# Patient Record
Sex: Female | Born: 1965 | Race: White | Hispanic: No | Smoking: Former smoker
Health system: Southern US, Community
[De-identification: ages and names within clinical notes are randomized; demographics above are authoritative.]

## PROBLEM LIST (undated history)

## (undated) DIAGNOSIS — I739 Peripheral vascular disease, unspecified: Secondary | ICD-10-CM

## (undated) DIAGNOSIS — I639 Cerebral infarction, unspecified: Secondary | ICD-10-CM

## (undated) DIAGNOSIS — Z720 Tobacco use: Secondary | ICD-10-CM

## (undated) DIAGNOSIS — I2510 Atherosclerotic heart disease of native coronary artery without angina pectoris: Secondary | ICD-10-CM

## (undated) DIAGNOSIS — I10 Essential (primary) hypertension: Secondary | ICD-10-CM

## (undated) DIAGNOSIS — Z9289 Personal history of other medical treatment: Secondary | ICD-10-CM

## (undated) DIAGNOSIS — I4891 Unspecified atrial fibrillation: Secondary | ICD-10-CM

## (undated) DIAGNOSIS — I779 Disorder of arteries and arterioles, unspecified: Secondary | ICD-10-CM

## (undated) DIAGNOSIS — I340 Nonrheumatic mitral (valve) insufficiency: Secondary | ICD-10-CM

## (undated) DIAGNOSIS — G5139 Clonic hemifacial spasm, unspecified: Secondary | ICD-10-CM

## (undated) DIAGNOSIS — I509 Heart failure, unspecified: Secondary | ICD-10-CM

## (undated) DIAGNOSIS — Z9889 Other specified postprocedural states: Secondary | ICD-10-CM

## (undated) DIAGNOSIS — E785 Hyperlipidemia, unspecified: Secondary | ICD-10-CM

## (undated) DIAGNOSIS — Z951 Presence of aortocoronary bypass graft: Secondary | ICD-10-CM

## (undated) DIAGNOSIS — I429 Cardiomyopathy, unspecified: Secondary | ICD-10-CM

## (undated) DIAGNOSIS — D689 Coagulation defect, unspecified: Secondary | ICD-10-CM

## (undated) DIAGNOSIS — J069 Acute upper respiratory infection, unspecified: Secondary | ICD-10-CM

## (undated) DIAGNOSIS — R011 Cardiac murmur, unspecified: Secondary | ICD-10-CM

## (undated) HISTORY — DX: Nonrheumatic mitral (valve) insufficiency: I34.0

## (undated) HISTORY — PX: OTHER SURGICAL HISTORY: SHX169

## (undated) HISTORY — DX: Tobacco use: Z72.0

## (undated) HISTORY — DX: Hyperlipidemia, unspecified: E78.5

## (undated) HISTORY — DX: Cardiomyopathy, unspecified: I42.9

## (undated) HISTORY — PX: TUBAL LIGATION: SHX77

## (undated) HISTORY — DX: Coagulation defect, unspecified: D68.9

## (undated) HISTORY — PX: CARDIAC CATHETERIZATION: SHX172

## (undated) HISTORY — DX: Essential (primary) hypertension: I10

## (undated) HISTORY — DX: Heart failure, unspecified: I50.9

## (undated) HISTORY — DX: Cerebral infarction, unspecified: I63.9

## (undated) HISTORY — PX: DENTAL SURGERY: SHX609

## (undated) HISTORY — DX: Personal history of other medical treatment: Z92.89

## (undated) HISTORY — DX: Cardiac murmur, unspecified: R01.1

## (undated) HISTORY — DX: Atherosclerotic heart disease of native coronary artery without angina pectoris: I25.10

## (undated) HISTORY — DX: Clonic hemifacial spasm, unspecified: G51.39

## (undated) HISTORY — PX: TONSILLECTOMY: SUR1361

## (undated) SURGERY — ECHOCARDIOGRAM, TRANSESOPHAGEAL
Anesthesia: Moderate Sedation

---

## 1987-08-23 DIAGNOSIS — I639 Cerebral infarction, unspecified: Secondary | ICD-10-CM

## 1987-08-23 HISTORY — DX: Cerebral infarction, unspecified: I63.9

## 1997-12-19 ENCOUNTER — Emergency Department (HOSPITAL_COMMUNITY): Admission: EM | Admit: 1997-12-19 | Discharge: 1997-12-19 | Payer: Self-pay | Admitting: Emergency Medicine

## 1999-06-23 ENCOUNTER — Other Ambulatory Visit: Admission: RE | Admit: 1999-06-23 | Discharge: 1999-06-23 | Payer: Self-pay | Admitting: Obstetrics and Gynecology

## 2000-05-22 ENCOUNTER — Other Ambulatory Visit: Admission: RE | Admit: 2000-05-22 | Discharge: 2000-05-22 | Payer: Self-pay | Admitting: Obstetrics and Gynecology

## 2001-01-09 ENCOUNTER — Emergency Department (HOSPITAL_COMMUNITY): Admission: EM | Admit: 2001-01-09 | Discharge: 2001-01-09 | Payer: Self-pay | Admitting: Emergency Medicine

## 2001-01-09 ENCOUNTER — Encounter: Payer: Self-pay | Admitting: Emergency Medicine

## 2011-05-14 ENCOUNTER — Inpatient Hospital Stay (HOSPITAL_COMMUNITY)
Admission: EM | Admit: 2011-05-14 | Discharge: 2011-05-19 | DRG: 392 | Disposition: A | Discharge status: Home / Self Care | Payer: Medicaid Other | Source: Ambulatory Visit | Attending: Internal Medicine | Admitting: Internal Medicine

## 2011-05-14 ENCOUNTER — Emergency Department (HOSPITAL_COMMUNITY): Payer: Medicaid Other

## 2011-05-14 DIAGNOSIS — F17200 Nicotine dependence, unspecified, uncomplicated: Secondary | ICD-10-CM | POA: Diagnosis present

## 2011-05-14 DIAGNOSIS — Z79899 Other long term (current) drug therapy: Secondary | ICD-10-CM

## 2011-05-14 DIAGNOSIS — F1010 Alcohol abuse, uncomplicated: Secondary | ICD-10-CM | POA: Diagnosis present

## 2011-05-14 DIAGNOSIS — E876 Hypokalemia: Secondary | ICD-10-CM | POA: Diagnosis present

## 2011-05-14 DIAGNOSIS — K029 Dental caries, unspecified: Secondary | ICD-10-CM | POA: Diagnosis present

## 2011-05-14 DIAGNOSIS — I428 Other cardiomyopathies: Secondary | ICD-10-CM | POA: Diagnosis present

## 2011-05-14 DIAGNOSIS — D7589 Other specified diseases of blood and blood-forming organs: Secondary | ICD-10-CM | POA: Diagnosis present

## 2011-05-14 DIAGNOSIS — I10 Essential (primary) hypertension: Secondary | ICD-10-CM | POA: Diagnosis present

## 2011-05-14 DIAGNOSIS — E669 Obesity, unspecified: Secondary | ICD-10-CM | POA: Diagnosis present

## 2011-05-14 DIAGNOSIS — E871 Hypo-osmolality and hyponatremia: Secondary | ICD-10-CM | POA: Diagnosis not present

## 2011-05-14 DIAGNOSIS — Z8673 Personal history of transient ischemic attack (TIA), and cerebral infarction without residual deficits: Secondary | ICD-10-CM

## 2011-05-14 DIAGNOSIS — R1011 Right upper quadrant pain: Principal | ICD-10-CM | POA: Diagnosis present

## 2011-05-14 DIAGNOSIS — Z7982 Long term (current) use of aspirin: Secondary | ICD-10-CM

## 2011-05-14 DIAGNOSIS — Z8249 Family history of ischemic heart disease and other diseases of the circulatory system: Secondary | ICD-10-CM

## 2011-05-14 LAB — URINALYSIS, ROUTINE W REFLEX MICROSCOPIC
Glucose, UA: NEGATIVE mg/dL
Hgb urine dipstick: NEGATIVE
Ketones, ur: NEGATIVE mg/dL
Leukocytes, UA: NEGATIVE
Nitrite: NEGATIVE
Protein, ur: >300 mg/dL — AB
Specific Gravity, Urine: 1.019 (ref 1.005–1.030)
Urobilinogen, UA: 1 mg/dL (ref 0.0–1.0)
pH: 6 (ref 5.0–8.0)

## 2011-05-14 LAB — CBC
HCT: 43.1 % (ref 36.0–46.0)
Hemoglobin: 14.6 g/dL (ref 12.0–15.0)
MCH: 36 pg — ABNORMAL HIGH (ref 26.0–34.0)
MCHC: 33.9 g/dL (ref 30.0–36.0)
MCV: 106.4 fL — ABNORMAL HIGH (ref 78.0–100.0)
Platelets: 234 10*3/uL (ref 150–400)
RBC: 4.05 MIL/uL (ref 3.87–5.11)
RDW: 13.7 % (ref 11.5–15.5)
WBC: 9.8 10*3/uL (ref 4.0–10.5)

## 2011-05-14 LAB — COMPREHENSIVE METABOLIC PANEL
BUN: 10 mg/dL (ref 6–23)
Calcium: 9.5 mg/dL (ref 8.4–10.5)
GFR calc Af Amer: >60 mL/min (ref >60)
Glucose, Bld: 111 mg/dL — ABNORMAL HIGH (ref 70–99)
Total Protein: 7.3 g/dL (ref 6.0–8.3)

## 2011-05-14 LAB — DIFFERENTIAL
Basophils Absolute: 0.1 10*3/uL (ref 0.0–0.1)
Basophils Relative: 1 % (ref 0–1)
Eosinophils Absolute: 0.2 10*3/uL (ref 0.0–0.7)
Eosinophils Relative: 2 % (ref 0–5)
Lymphocytes Relative: 23 % (ref 12–46)
Lymphs Abs: 2.2 10*3/uL (ref 0.7–4.0)
Monocytes Absolute: 1.1 10*3/uL — ABNORMAL HIGH (ref 0.1–1.0)
Monocytes Relative: 11 % (ref 3–12)
Neutro Abs: 6.3 10*3/uL (ref 1.7–7.7)
Neutrophils Relative %: 65 % (ref 43–77)

## 2011-05-14 LAB — LIPASE, BLOOD: Lipase: 46 U/L (ref 11–59)

## 2011-05-15 ENCOUNTER — Inpatient Hospital Stay (HOSPITAL_COMMUNITY): Payer: Medicaid Other

## 2011-05-15 DIAGNOSIS — R1011 Right upper quadrant pain: Secondary | ICD-10-CM

## 2011-05-15 LAB — BASIC METABOLIC PANEL
BUN: 10 mg/dL (ref 6–23)
Calcium: 8.7 mg/dL (ref 8.4–10.5)
Creatinine, Ser: 0.74 mg/dL (ref 0.50–1.10)
GFR calc Af Amer: >60 mL/min (ref >60)
GFR calc non Af Amer: >60 mL/min (ref >60)
Potassium: 3.9 mEq/L (ref 3.5–5.1)

## 2011-05-15 LAB — DIFFERENTIAL
Eosinophils Relative: 0 % (ref 0–5)
Lymphocytes Relative: 11 % — ABNORMAL LOW (ref 12–46)
Lymphs Abs: 1.3 10*3/uL (ref 0.7–4.0)
Monocytes Absolute: 1.1 10*3/uL — ABNORMAL HIGH (ref 0.1–1.0)

## 2011-05-15 LAB — HEPATIC FUNCTION PANEL
AST: 16 U/L (ref 0–37)
Alkaline Phosphatase: 84 U/L (ref 39–117)
Bilirubin, Direct: 0.4 mg/dL — ABNORMAL HIGH (ref 0.0–0.3)
Total Bilirubin: 1.6 mg/dL — ABNORMAL HIGH (ref 0.3–1.2)

## 2011-05-15 LAB — CBC
HCT: 39.9 % (ref 36.0–46.0)
MCV: 107.5 fL — ABNORMAL HIGH (ref 78.0–100.0)
RDW: 13.7 % (ref 11.5–15.5)
WBC: 11.1 10*3/uL — ABNORMAL HIGH (ref 4.0–10.5)

## 2011-05-15 LAB — RAPID URINE DRUG SCREEN, HOSP PERFORMED
Benzodiazepines: NOT DETECTED
Cocaine: NOT DETECTED

## 2011-05-15 MED ORDER — TECHNETIUM TC 99M MEBROFENIN IV KIT
5.0000 | PACK | Freq: Once | INTRAVENOUS | Status: AC | PRN
  Administered 2011-05-15: 5 via INTRAVENOUS

## 2011-05-16 DIAGNOSIS — I10 Essential (primary) hypertension: Secondary | ICD-10-CM

## 2011-05-16 DIAGNOSIS — I509 Heart failure, unspecified: Secondary | ICD-10-CM

## 2011-05-16 LAB — FOLATE RBC: RBC Folate: 543 ng/mL (ref >=366)

## 2011-05-17 ENCOUNTER — Inpatient Hospital Stay (HOSPITAL_COMMUNITY): Payer: Medicaid Other

## 2011-05-17 LAB — BASIC METABOLIC PANEL
BUN: 11 mg/dL (ref 6–23)
CO2: 22 mEq/L (ref 19–32)
Chloride: 99 mEq/L (ref 96–112)
Glucose, Bld: 107 mg/dL — ABNORMAL HIGH (ref 70–99)
Potassium: 3.4 mEq/L — ABNORMAL LOW (ref 3.5–5.1)

## 2011-05-17 LAB — LIPID PANEL
HDL: 18 mg/dL — ABNORMAL LOW (ref >39)
LDL Cholesterol: 105 mg/dL — ABNORMAL HIGH (ref 0–99)
Total CHOL/HDL Ratio: 8 RATIO
VLDL: 21 mg/dL (ref 0–40)

## 2011-05-17 LAB — HEMOGLOBIN A1C: Hgb A1c MFr Bld: 5.8 % — ABNORMAL HIGH (ref <5.7)

## 2011-05-18 ENCOUNTER — Ambulatory Visit (HOSPITAL_COMMUNITY): Payer: 59 | Admitting: Dentistry

## 2011-05-18 LAB — BASIC METABOLIC PANEL
BUN: 13 mg/dL (ref 6–23)
CO2: 26 mEq/L (ref 19–32)
Calcium: 8.6 mg/dL (ref 8.4–10.5)
GFR calc non Af Amer: >60 mL/min (ref >60)
Glucose, Bld: 112 mg/dL — ABNORMAL HIGH (ref 70–99)

## 2011-05-19 LAB — BASIC METABOLIC PANEL
BUN: 13 mg/dL (ref 6–23)
CO2: 25 mEq/L (ref 19–32)
Calcium: 9.3 mg/dL (ref 8.4–10.5)
GFR calc non Af Amer: >60 mL/min (ref >60)
Glucose, Bld: 124 mg/dL — ABNORMAL HIGH (ref 70–99)

## 2011-05-20 ENCOUNTER — Encounter: Payer: Self-pay | Admitting: Physician Assistant

## 2011-05-22 NOTE — H&P (Signed)
Ashley [name], Ashley [name] NO.:  [id number]  MEDICAL RECORD NO.:  [id number]  LOCATION:  5019                         FACILITY:  [facility]  PHYSICIAN:  Della Goo, M.D. DATE OF BIRTH:  16-Jun-1966  DATE OF ADMISSION:  05/14/2011 DATE OF DISCHARGE:                             HISTORY & PHYSICAL   PRIMARY CARE PHYSICIAN:  None.  CHIEF COMPLAINT:  Right upper quadrant abdominal pain.  HISTORY OF PRESENT ILLNESS:  This is a 45 year old female who presents to the emergency department secondary to constant worsening right upper quadrant abdominal pain over the past 3 days.  She has had nausea. Denies having any vomiting and she also reports having subjective fevers and chills.  She rates her pain as being in 8/10 at the worse and states that the pain first began about 4 weeks ago and was occurring off and on at that time.  The patient was seen and evaluated in the emergency department, had an abdominal ultrasound study and a CT scan of the abdomen performed, results of which revealed an acalculous cholecystitis.  General Surgery was consulted to see this patient, Dr. Donell Beers, and arrangements have been made for the patient to be admitted to the hospitalist service and for General Surgery consultation for intervention.  PAST MEDICAL HISTORY:  Significant for previous cerebrovascular accident, mitral valve prolapse.  MEDICATIONS:  At this time include aspirin 81 mg 1 p.o. daily.  PAST SURGICAL HISTORY:  Tonsillectomy and C-section x1.  ALLERGIES:  No known drug allergies.  SOCIAL HISTORY:  The patient is a smoker.  She smokes one pack of cigarettes daily for 30 years.  She reports of alcohol usage 1 glass of wine every 3 days.  She denies any illicit drug usage.  The patient also has diffuse dental caries and periodontal disease.  FAMILY HISTORY:  Positive for coronary artery disease in her father, hypertension in her mother and her maternal grandmother had bone  cancer.  REVIEW OF SYSTEMS:  Pertinents mentioned above.  PHYSICAL EXAMINATION FINDINGS:  GENERAL:  This is a 45 year old obese, disheveled-appearing Caucasian female who is in discomfort but no acute distress. VITAL SIGNS:  Temperature 97.9, blood pressure initially 229/145 now 191/115.  Heart rate 98, respirations 19, O2 sats 97%.  HEENT: Normocephalic, atraumatic.  Pupils are equally round and reactive to light.  Extraocular movements are intact.  Funduscopic benign.  There is no scleral icterus.  Nares are patent bilaterally.  Oropharynx is clear except the patient has diffuse dental caries, broken sparse teeth.  Her gums are inflamed. NECK:  Supple.  Full range of motion.  No thyromegaly, adenopathy or jugular venous distention. CARDIOVASCULAR:  Regular rate and rhythm.  No murmurs, gallops or rubs appreciated. LUNGS:  Clear to auscultation bilaterally.  No rales, rhonchi or wheezes. ABDOMEN:  Positive bowel sounds, soft, tender in the right upper quadrant area.  She does have a positive Murphy sign.  Otherwise abdomen is soft, nondistended.  It is obese.  No hepatosplenomegaly. EXTREMITIES:  Without cyanosis, clubbing or edema. NEUROLOGIC:  Nonfocal.  LABORATORY STUDIES:  White blood cell count 9.9, hemoglobin 14.6, hematocrit 43.1, MCV 106.4, platelets 234, neutrophils 65%, lymphocytes 23%.  Sodium 135, potassium  4.1, chloride 99, carbon dioxide 25, BUN 10, creatinine 0.76 and glucose 111.  Urinalysis with protein greater than 300.  CT scan of the abdomen and pelvis reveals inflammation of fat surrounding gallbladder, possible mild gallbladder wall thickening. Abdominal ultrasound reveals a well-distended gallbladder, no gallstones, thickened gallbladder measuring up to 8 mm in thickness, small amount of pericholecystic fluid present and a positive Murphy sign.  The common bile duct is not dilated measuring 3.4 mm.  ASSESSMENT:  A 45 year old female being admitted with 1.  Acute acalculous cholecystitis. 2. Abdominal pain in the right upper quadrant secondary to #1. 3. Malignant hypertension. 4. Nausea. 5. History of mitral valve prolapse. 6. Diffuse dental caries.  PLAN:  The patient will be admitted to med surgery area.  She will be made n.p.o. and IV fluids have been ordered while she is n.p.o.  Pain control and antiemetic therapy have been ordered and p.r.n. IV hydralazine has been ordered for her elevated blood pressure.  Some of her elevation in blood pressure may also be secondary to pain. Premedication of this patient secondary to her mitral valve prolapse and her dental caries will be investigated.  The patient will be placed on DVT prophylaxis of SCDs at this time.     Della Goo, M.D.     HJ/MEDQ  D:  05/15/2011  T:  05/15/2011  Job:  [id number]  Electronically Signed by Della Goo M.D. on 05/22/2011 10:23:52 PM

## 2011-05-23 ENCOUNTER — Ambulatory Visit (INDEPENDENT_AMBULATORY_CARE_PROVIDER_SITE_OTHER): Payer: Self-pay | Admitting: Physician Assistant

## 2011-05-23 ENCOUNTER — Encounter: Payer: Self-pay | Admitting: Physician Assistant

## 2011-05-23 VITALS — BP 178/108 | HR 62 | Ht 68.0 in | Wt 206.8 lb

## 2011-05-23 DIAGNOSIS — F17200 Nicotine dependence, unspecified, uncomplicated: Secondary | ICD-10-CM

## 2011-05-23 DIAGNOSIS — I10 Essential (primary) hypertension: Secondary | ICD-10-CM | POA: Insufficient documentation

## 2011-05-23 DIAGNOSIS — I428 Other cardiomyopathies: Secondary | ICD-10-CM

## 2011-05-23 DIAGNOSIS — Z720 Tobacco use: Secondary | ICD-10-CM | POA: Insufficient documentation

## 2011-05-23 MED ORDER — CARVEDILOL 12.5 MG PO TABS: ORAL_TABLET | ORAL | Status: DC

## 2011-05-23 NOTE — Assessment & Plan Note (Signed)
Uncontrolled.  I will change her metoprolol to carvedilol 12.5 mg twice a day.  She will followup with me early next week.  If her blood pressure is better controlled, we will arrange cardiac catheterization as previously planned.

## 2011-05-23 NOTE — Assessment & Plan Note (Signed)
He is committed to quitting.

## 2011-05-23 NOTE — Patient Instructions (Signed)
Your physician recommends that you schedule a follow-up appointment in: 05/30/11 WITH SCOTT WEAVER, PA-C  Your physician has recommended you make the following change in your medication: STOP METOPROLOL; START COREG (CARVEDILOL) 12.5 MG, TONIGHT AND TOMORROW MORNING 05/24/11 TAKE ONLY 1/2 TABLET OF COREG; STARTING TOMORROW 05/24/11 AFTERNOON YOU WILL TAKE 1 WHOLE TABLET TWICE DAILY  Your physician recommends that you return for lab work in: TODAY BMET 401.1

## 2011-05-23 NOTE — Assessment & Plan Note (Signed)
No signs or symptoms of congestive heart failure.  She will continue ACE inhibitor and beta blocker.  As noted, change to Coreg.  Check a basic metabolic panel today.

## 2011-05-23 NOTE — Progress Notes (Signed)
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 female who presents for post hospital follow up.    She has a history of mitral valve prolapse, prior stroke in the setting of oral contraceptives and tobacco abuse and hypertension.    She was admitted 9/22-9/27.  She initially presented with abdominal pain.  Workup included an abdominal ultrasound, abdominal CT and HIDA scan.  There was a suspicion for chronic cholecystitis.  Her abdominal pain subsided.  She was noted to have cardiomegaly on CT.  This prompted an echocardiogram 9/24: Mild LVH, EF 35-40%, moderate MR which was difficult to judge with posterior leaflet restriction, moderate LAE, moderate RAE, moderate RVE, moderate TR.  She had significant hypertension with 229/145 upon presentation.  She had mild volume overload noted.  She was seen by Dr. Tenny Craw in consultation.  Right and left heart catheterization was recommended.  However, her blood pressure remained too high and it was felt that her blood pressure needs better control prior to proceeding with cardiac catheterization. She was also noted to have significant dental caries and has been referred to the dentist as an outpatient.    Pertinent labs:  Hemoglobin 13.6, MCV 107.5, potassium 3.6, creatinine 0.68, ALT 17, TC 144, TG 105, HDL 18, LDL 105, TSH 2.325.  Denies chest pain.  She has an occasional headache.  She denies significant shortness of breath.  She describes class II-IIb symptoms.  She denies orthopnea, PND or edema.  She denies syncope.  She feels somewhat weak since getting out of the hospital but is continuing to feel stronger.  She admits to compliance with medications.  She is now wearing a nitroglycerin patch and is trying to quit smoking.  She has been checking her BPs at home.  They are equal to what we obtained in the office today.   Past Medical History  Diagnosis Date  . Abdominal pain     ? chronic cholecystitis;  admxn to [facility] 9/12 (CT, USN, HIDA done)  . Stroke     in setting of cigs and OCPs  . MVP (mitral valve prolapse)   . Cardiomyopathy     echo 9/12: mild LVH, EF 35-40%, mod MR (difficult to judge /post leaflet restricted), mod BAE, mod RVE, mod TR  . HTN (hypertension)   . Tobacco abuse     Current Outpatient Prescriptions  Medication Sig Dispense Refill  . amLODipine (NORVASC) 5 MG tablet Take 5 mg by mouth daily.        . famotidine (PEPCID) 20 MG tablet Take 20 mg by mouth as needed.        [name] lisinopril (PRINIVIL,ZESTRIL) 10 MG tablet Take 10 mg by mouth 2 (two) times daily.        . nicotine (NICODERM CQ - DOSED IN MG/24 HOURS) 21 mg/24hr patch Place 1 patch onto the skin daily.        [name] aspirin EC 81 MG tablet Take 1 tablet (81 mg total) by mouth daily.      . carvedilol (COREG) 12.5 MG tablet TONIGHT TAKE 1/2 (HALF) OF TABLET AND TOMORROW AM 05/24/11 TAKE 1/2 (HALF) TABLET; STARTING TOMORROW 05/24/11 AFTERNOON YOU WILL TAKE 1 WHOLE TABLET TWICE DAILY  30 tablet  11  . hydrochlorothiazide (MICROZIDE) 12.5 MG capsule Take 1 capsule (12.5 mg total) by mouth daily.      [name] DISCONTD: carvedilol (COREG) 12.5 MG tablet TONIGHT TAKE 1/2 (HALF) OF TABLET AND TOMORROW AM  05/24/11 TAKE 1/2 (HALF) TABLET; STARTING TOMORROW 05/24/11 AFTERNOON YOU WILL TAKE 1 WHOLE TABLET TWICE DAILY  30 tablet  11  . DISCONTD: carvedilol (COREG) 12.5 MG tablet TONIGHT TAKE 1/2 (HALF) OF TABLET AND TOMORROW AM 05/24/11 TAKE 1/2 (HALF) TABLET; STARTING TOMORROW 05/24/11 AFTERNOON YOU WILL TAKE 1 WHOLE TABLET TWICE DAILY  30 tablet  11    Allergies: No Known Allergies  Vital Signs: BP 178/108  Pulse 62  Ht 5\' 8"  (1.727 m)  Wt 206 lb 12.8 oz (93.804 kg)  BMI 31.44 kg/m2 Repeat BP by me 180/110 bilaterally  PHYSICAL EXAM: Well nourished, well developed, in no acute distress HEENT: normal Neck: no JVD Cardiac:  normal S1, S2; RRR; 2/6 systolic murmur along LSB Lungs:  clear to auscultation bilaterally, no wheezing,  rhonchi or rales Abd: soft, nontender, no hepatomegaly Ext: no edema Skin: warm and dry Neuro:  CNs 2-12 intact, no focal abnormalities noted Psych: normal affect  EKG:  Sinus rhythm, heart rate 62, left axis deviation, nonspecific ST-T wave changes  ASSESSMENT AND PLAN:

## 2011-05-24 LAB — BASIC METABOLIC PANEL
CO2: 30 mEq/L (ref 19–32)
Calcium: 9 mg/dL (ref 8.4–10.5)
GFR: 73.28 mL/min (ref >60.00)
Sodium: 137 mEq/L (ref 135–145)

## 2011-05-25 NOTE — Consult Note (Addendum)
Ashley [name], Ashley [name] NO.:  [id number]  MEDICAL RECORD NO.:  [id number]  LOCATION:  5019                         FACILITY:  [facility]  PHYSICIAN:  Pricilla Riffle, MD, FACCDATE OF BIRTH:  1966/02/08  DATE OF CONSULTATION:  05/16/2011 DATE OF DISCHARGE:                                CONSULTATION   PRIMARY MEDICAL DOCTOR:  None.  PRIMARY CARDIOLOGIST:  New.  CHIEF COMPLAINT:  Right-sided abdominal pain.  REASON FOR CONSULTATION:  New LV dysfunction with EF of 35-40%.  HISTORY OF PRESENT ILLNESS:  Ashley [name] is a very pleasant 45 year old lady with a history of mitral valve prolapse evaluated approximately 16 years ago, hypertension, CVA, and tobacco abuse who was admitted with right upper quadrant pain.  Initial CT of the abdomen and abdominal ultrasound suggested acalculous cholecystitis.  However, HIDA demonstrated lack of dilatation of the cystic duct and common bile duct and therefore not felt to be consistent with acute cholecystitis.  No surgical interventions were felt necessary.  The HIDA scan suggests chronic cholecystitis versus intrinsic liver disease.  The patient was started on antibiotics with improvement in her symptoms.  She has some nausea without vomiting.  Also, on admission, she was noted to be markedly hypertensive with blood pressure of 229/145.  She denied any shortness of breath or chest pain, but has occasional dyspnea on insertion with stairs.  No orthopnea.  On her CT of the abdomen, it was then suddenly noted that she had marked cardiomegaly with LV predominant.  As part of her workup, 2-D echocardiogram was obtained showing mild concentric LVH with an EF of 35-40% with diffuse hypokinesis.  There was eccentric MR which was difficult to assess in severity with a restricted posterior leaflet.  She had moderate TR and PA pressure of 53 mmHg.  The patient denies any history of heart problems.  PAST MEDICAL HISTORY: 1. CVA at age. 45,  seen by Dr. Vassie [name] at that time.  This was felt     secondary to a combination of oral contraceptive pills and tobacco     use.  She developed dysarthria, left facial numbness, and left-     sided weakness with some residual facial asymmetry and left index     finger weakness. 2. Mitral valve prolapse evaluated 16 years ago. 3. Hypertension, diagnosed here. 4. Tobacco use. 5. LV dysfunction, newly diagnosed here, see above.  SURGICAL HISTORY:  Tonsillectomy and C-section.  MEDICATIONS:  As outpatient include aspirin 81 mg and Midol p.r.n.  Inpatient medications:  Unasyn, Rocephin, hydrochlorothiazide 25 mg, Lopressor 50 mg b.i.d., multivitamin, nicotine patch, Protonix 40 mg daily, and thiamine 100 mg daily.  ALLERGIES:  No known drug allergies.  SOCIAL HISTORY:  The patient is married.  Her husband actually sees Dr. Graciela [name].  They have 47 year old twins.  She is motivated to quit smoking as of this admission, and has smoked for 30 years.  She drinks one glass of wine a day, sometimes 2 on the weekends.  She denies illicit drug use.  Her dentition is poor.  FAMILY HISTORY:  Positive for coronary artery disease in her father and hypertension in her mother.  REVIEW OF SYSTEMS:  Positive  for low-grade fever last week.  Positive for dyspnea on exertion on occasion as well as occasional cough.  She has had nausea without vomiting.  No chest pain.  All other systems reviewed and otherwise negative.  LABORATORY DATA:  WBC 11.1, hemoglobin 13.6, hematocrit 39.9, and platelet count 196.  Sodium 136, potassium 3.9, chloride 103, CO2 of 23, glucose 128, BUN 10, creatinine 0.74, and glucose 128.  Total bilirubin 1.6, otherwise LFTs are normal.  Lipase 25.  Urine drug screen negative. B12 and folate were within normal limits.  UA showed small ketones, greater than 300 urobilinogen.  EKG:  Normal sinus rhythm, subtle ST depression in I, II, and V4-V6 with otherwise nonspecific ST-T  changes.  RADIOLOGIC STUDIES:  Chest x-ray showed cardiomegaly and mild interstitial edema.  See EMR for HIDA, CT of the abdomen and abdominal ultrasound results.  PHYSICAL EXAMINATION:  VITAL SIGNS:  Temperature 98.4, pulse 72, respirations 18, blood pressure 160/100, and pulse ox 94% on room air. GENERAL:  This is a pleasant middle-aged white female in no acute distress. HEENT:  Normocephalic and atraumatic.  Extraocular movements are intact. Clear sclerae.  She has poor dentition.  Left eye protuberance noted. NECK:  Supple without carotid bruit.  JVD is elevated at 10-11 cm. HEART:  Auscultation of the heart reveals regular rate and rhythm with a 2/6 systolic ejection murmur at the apex. LUNGS:  Clear to auscultation bilaterally without wheezes, rales, or rhonchi. ABDOMEN:  Soft with mild right upper quadrant tenderness.  Positive bowel sounds. EXTREMITIES:  Warm, dry, and with 2+ pedal pulses.  No lower extremity edema. NEUROLOGICAL:  She is alert and oriented x3 and responds questions appropriately.  Strength is 5/5 in all extremities with the exception of the left index finger unable to bend.  She has left eye protuberance.  ASSESSMENT/PLAN:  The patient was seen and examined by Dr. Tenny Craw and myself.  This is a 45 year old female with a history of cerebrovascular accident, tobacco use, mitral valve prolapse, and hypertension that was diagnosed here in the hospital who presents with right upper quadrant discomfort, with eventual HIDA scan suggesting chronic cholecystitis versus intrinsic liver disease.  Long way she has also been diagnosed with LV dysfunction with an EF of 35-40% with diffuse hypokinesis in the setting of multiple cardiac risk factors.  She was extremely hypertensive on admission and this has improved, although still not ideal.  Exam reveals some mild volume overload.  At this time, we recommend a right and left heart catheterization with LV-gram to  define her coronary anatomy given her LV dysfunction.  We will also have to work with her mitral valve better.  Risks, benefits, and alternatives were discussed with the patient especially in light of her CVA history and the patient wishes to proceed.  We will add aspirin.  We will check a TSH, hemoglobin A1c, and fasting lipid for further risk stratification.  For now, we will add Norvasc for improved control of her blood pressure with eventual plan to change this to an ACE inhibitor after her catheterization provided her renal function remains stable. We do recommend to continue to beta-blocker.  The patient was counseled regarding tobacco use.  We will also follow strict Is and Os and daily weight.  The patient may benefit from teaching from a nutrition/pharmacy standpoint regarding CHF following her catheterization.  Thank you for the opportunity participating in the care of this patient.     Dayna Dunn, P.A.C.   ______________________________ Pricilla Riffle,  MD, [facility]    DD/MEDQ  D:  05/16/2011  T:  05/17/2011  Job:  [id number]  Electronically Signed by Ronie Spies  on 05/25/2011 09:23:36 PM Electronically Signed by Dietrich Pates MD FACC on 06/02/2011 10:22:06 AM

## 2011-05-30 ENCOUNTER — Ambulatory Visit (INDEPENDENT_AMBULATORY_CARE_PROVIDER_SITE_OTHER): Payer: Self-pay | Admitting: Physician Assistant

## 2011-05-30 ENCOUNTER — Encounter: Payer: Self-pay | Admitting: *Deleted

## 2011-05-30 ENCOUNTER — Encounter: Payer: Self-pay | Admitting: Physician Assistant

## 2011-05-30 DIAGNOSIS — Z720 Tobacco use: Secondary | ICD-10-CM

## 2011-05-30 DIAGNOSIS — I10 Essential (primary) hypertension: Secondary | ICD-10-CM

## 2011-05-30 DIAGNOSIS — I428 Other cardiomyopathies: Secondary | ICD-10-CM

## 2011-05-30 DIAGNOSIS — F17200 Nicotine dependence, unspecified, uncomplicated: Secondary | ICD-10-CM

## 2011-05-30 NOTE — Progress Notes (Signed)
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  She has a history of mitral valve prolapse, prior stroke in the setting of oral contraceptives and tobacco abuse and hypertension.   She was admitted 9/22-9/27 with abdominal pain.  She was noted to have cardiomegaly on CT.  Echocardiogram 9/24: Mild LVH, EF 35-40%, moderate MR which was difficult to judge with posterior leaflet restriction, moderate LAE, moderate RAE, moderate RVE, moderate TR.  She had significant hypertension with 229/145 upon presentation and mild volume overload.  Right and left heart catheterization was recommended but, her blood pressure remained too high and it was felt that her blood pressure needed better control prior to proceeding with cardiac catheterization.   I saw her in post hospital follow up 10/1 and her BP remained too high.  I changed her metoprolol to coreg.  Follow up labs: K 4.1, creatinine 0.9.    Blood pressures from home were reviewed today.  Her readings appear improved.  Most recent readings range 155/77-160/82.  Blood pressure looks much better.  The patient denies chest pain, shortness of breath, syncope, orthopnea, PND or significant pedal edema.  She did take an extra HCTZ once for some ankle edema.  She quit smoking and is on the NTG patch.    Past Medical History  Diagnosis Date  . Abdominal pain     ? chronic cholecystitis; admxn to [facility] 9/12 (CT, USN, HIDA done)  . Stroke     in setting of cigs and OCPs  . MVP (mitral valve prolapse)   . Cardiomyopathy     echo 9/12: mild LVH, EF 35-40%, mod MR (difficult to judge /post leaflet restricted), mod BAE, mod RVE, mod TR  . HTN (hypertension)   . Tobacco abuse     Current Outpatient Prescriptions  Medication Sig Dispense Refill  . amLODipine (NORVASC) 5 MG tablet Take 5 mg by mouth daily.        [name] aspirin EC 81 MG tablet Take 1 tablet (81 mg total) by mouth daily.        . carvedilol (COREG) 12.5 MG tablet Take 12.5 mg by mouth 2 (two) times daily with a meal.        . famotidine (PEPCID) 20 MG tablet Take 20 mg by mouth as needed.        . hydrochlorothiazide (MICROZIDE) 12.5 MG capsule Take 1 capsule (12.5 mg total) by mouth daily.      [name] lisinopril (PRINIVIL,ZESTRIL) 10 MG tablet Take 10 mg by mouth 2 (two) times daily.        . nicotine (NICODERM CQ - DOSED IN MG/24 HOURS) 21 mg/24hr patch Place 1 patch onto the skin daily.        [name] DISCONTD: carvedilol (COREG) 12.5 MG tablet TONIGHT TAKE 1/2 (HALF) OF TABLET AND TOMORROW AM 05/24/11 TAKE 1/2 (HALF) TABLET; STARTING TOMORROW 05/24/11 AFTERNOON YOU WILL TAKE 1 WHOLE TABLET TWICE DAILY  30 tablet  11    Allergies: No Known Allergies  Social Hx:  Quit smoking   ROS:  Please see the history of present illness.  Still has some occasional abdominal pain.  All other systems reviewed and negative.   Vital Signs: BP 166/82  Pulse 76  Ht 5\' 8"  (1.727 m)  Wt 194 lb (87.998 kg)  BMI 29.50 kg/m2  PHYSICAL EXAM: Well nourished, well developed, in no acute distress HEENT: normal Neck: no JVD  Vascular:  No FA bruits bilaterally  Cardiac:  normal S1, S2; RRR; 2/6 systolic murmur along LSB Lungs:  clear to auscultation bilaterally, no wheezing, rhonchi or rales Abd: soft, nontender, no hepatomegaly Ext: no edema Skin: warm and dry Neuro:  CNs 2-12 intact, no focal abnormalities noted Psych: normal affect   ASSESSMENT AND PLAN:

## 2011-05-30 NOTE — Assessment & Plan Note (Signed)
As noted, BP much better.  Plan right and left heart cath next week.  Get BMET early next week before cath due to increase in ACE.  Discussed with Dr. Tenny Craw who agreed to proceed with cath.  Risks and benefits of cardiac catheterization have been discussed with the patient.  These include bleeding, infection, kidney damage, stroke, heart attack, death.  The patient understands these risks and is willing to proceed.

## 2011-05-30 NOTE — Assessment & Plan Note (Signed)
Much better controlled.  Increase Lisinopril to 20 mg BID.  Repeat bmet in one week.

## 2011-05-30 NOTE — Patient Instructions (Addendum)
Increase lisinopril to 20mg  daily.  Your physician recommends that you return for lab work on Monday October 15,2012--BMP/CBCD/PT 425.4 401.9  [facility]  Your physician recommends that you schedule a follow-up appointment 2 weeks after the heart catheterization with Dr Tenny Craw or Brynda Rim.   Your physician has requested that you have a cardiac catheterization. Cardiac catheterization is used to diagnose and/or treat various heart conditions. Doctors may recommend this procedure for a number of different reasons. The most common reason is to evaluate chest pain. Chest pain can be a symptom of coronary artery disease (CAD), and cardiac catheterization can show whether plaque is narrowing or blocking your heart's arteries. This procedure is also used to evaluate the valves, as well as measure the blood flow and oxygen levels in different parts of your heart. For further information please visit [email or web address] Please follow instruction sheet, as given. Tuesday October 16,2012.

## 2011-05-30 NOTE — Assessment & Plan Note (Signed)
Congratulated her on quitting! 

## 2011-06-02 NOTE — Consult Note (Signed)
NAMEHILDAGARD, SOBECKI NO.:  [id number]  MEDICAL RECORD NO.:  [id number]  LOCATION:  5019                         FACILITY:  [facility]  PHYSICIAN:  Adolph Pollack, M.D.DATE OF BIRTH:  11/12/1965  DATE OF CONSULTATION:  05/15/2011 DATE OF DISCHARGE:                                CONSULTATION   REQUESTING PHYSICIAN:  Della Goo, MD  REASON:  Right upper quadrant pain and acute cholecystitis.  HISTORY:  Ms. Sonn is a 45 year old female who 4 days ago had the onset of right upper quadrant pain that was cramping and persistent. This associated with some nausea and low-grade fever.  She presented to the emergency department yesterday afternoon for evaluation.  While there she underwent a CT scan which demonstrated some questionable abnormality of her gallbladder as well as marked cardiomegaly and a prominent left ventricle.  No acute appendicitis or obvious inflammatory or infectious process was noted.  She subsequently underwent a limited ultrasound which demonstrated a gallbladder wall thickening and small amount of pericholecystic fluid which was suspicious for acute cholecystitis.  She also had a poorly-controlled hypertension.  She is admitted by the medical service and we were asked to see her to evaluate her for possible acute cholecystitis.  She said she had some similar pain like this a few weeks ago, but it resolved.  No jaundice or hepatitis in her past.  PAST MEDICAL HISTORY: 1. Cerebrovascular accident. 2. Mitral valve prolapse.  PREVIOUS OPERATION:  Cesarean section.  DRUG ALLERGIES:  None.  MEDICATIONS:  Aspirin 81 mg daily.  SOCIAL HISTORY:  She is married.  She has twins.  FAMILY HISTORY:  Remarkable for father who died from heart disease.  REVIEW OF SYSTEMS:  CARDIAC:  She states she has never had a heart attack or heart problem.  She states she was diagnosed in her 86s with mitral valve prolapse.  PULMONARY:  She denies any  COPD, asthma, pneumonia.  GI:  She denies any hepatitis.  GU:  Denies kidney stones. ENDOCRINE:  Denies diabetes or high cholesterol.  HEMATOLOGIC:  Denies any bleeding disorders or blood clots.  PHYSICAL EXAMINATION:  GENERAL:  An overweight female appears to be in no acute distress. VITAL SIGNS:  Temperature is 98.2 degrees, blood pressure is 194/109, pulse 90, respiratory rate 20, O2 sats 96% on room air.  HEENT: Extraocular motion is intact.  No icterus. NECK:  Supple without mass. RESPIRATORY:  Breath sounds equal and clear, respirations unlabored. CARDIOVASCULAR:  Heart demonstrates regular rate, regular rhythm, there is no murmur that I can appreciate.  There is no JVD and no lower extremity edema. ABDOMEN:  Soft and somewhat obese.  She has some right upper quadrant tenderness to palpation, but no Murphy sign.  No palpable mass or organomegaly is present.  No umbilical hernia is noted. SKIN:  Warm and dry.  No jaundice.  LABORATORY DATA:  Her lipase is normal at 46.  Her electrolytes within normal limits except for glucose of 111.  Bilirubin is 1.3.  Other liver function tests within normal limits.  White blood cell count is 9800 with no left shift.  Hemoglobin 14.6.  Platelet count 234,000.  IMPRESSION: 1. Right upper  quadrant pain which could be due to acute acalculous     cholecystitis versus a congestive hepatopathy. 2. Poorly controlled hypertension. 3. Cardiomegaly which could be secondary to hypertensive heart disease     and also prominent left ventricle on CT.  PLAN/RECOMMENDATIONS:  We will obtain a HIDA scan, try to confirm the diagnosis of acute cholecystitis.  We will place her on IV antibiotics empirically.  We would suggest cardiology consultation to evaluator her cardiomegaly and possible left ventricular hypertrophy and also potentially to help control her poorly-controlled hypertension.  If there are no significant medical problems or contraindications  and her HIDA scan is positive to suggesting acute cholecystitis, then we will recommend a laparoscopic possible open cholecystectomy this admission. I have discussed the procedure and the risks of cholecystectomy with her.  The risks include but not limit to bleeding, infection, wound healing problems, anesthesia, accidental injury to the bile duct/liver/intestine, bile leak, and postcholecystectomy diarrhea.  She seems to understand all of these.     Adolph Pollack, M.D.     Kari Baars  D:  05/15/2011  T:  05/15/2011  Job:  [id number]  Electronically Signed by Avel Peace M.D. on 06/02/2011 10:26:26 AM

## 2011-06-06 ENCOUNTER — Other Ambulatory Visit (HOSPITAL_COMMUNITY): Payer: Self-pay | Admitting: Dentistry

## 2011-06-06 ENCOUNTER — Ambulatory Visit: Payer: Self-pay

## 2011-06-06 DIAGNOSIS — M278 Other specified diseases of jaws: Secondary | ICD-10-CM

## 2011-06-06 DIAGNOSIS — I10 Essential (primary) hypertension: Secondary | ICD-10-CM

## 2011-06-06 DIAGNOSIS — K083 Retained dental root: Secondary | ICD-10-CM

## 2011-06-06 DIAGNOSIS — K0530 Chronic periodontitis, unspecified: Secondary | ICD-10-CM

## 2011-06-06 DIAGNOSIS — K045 Chronic apical periodontitis: Secondary | ICD-10-CM

## 2011-06-06 DIAGNOSIS — Z720 Tobacco use: Secondary | ICD-10-CM

## 2011-06-06 LAB — CBC WITH DIFFERENTIAL/PLATELET
Basophils Relative: 0.3 % (ref 0.0–3.0)
Eosinophils Relative: 1.9 % (ref 0.0–5.0)
HCT: 41.9 % (ref 36.0–46.0)
Lymphs Abs: 1.2 10*3/uL (ref 0.7–4.0)
MCV: 105.2 fl — ABNORMAL HIGH (ref 78.0–100.0)
Monocytes Absolute: 0.8 10*3/uL (ref 0.1–1.0)
RBC: 3.99 Mil/uL (ref 3.87–5.11)
WBC: 8.7 10*3/uL (ref 4.5–10.5)

## 2011-06-06 LAB — BASIC METABOLIC PANEL
Chloride: 99 mEq/L (ref 96–112)
Potassium: 4.8 mEq/L (ref 3.5–5.1)
Sodium: 140 mEq/L (ref 135–145)

## 2011-06-06 LAB — PROTIME-INR: INR: 1 ratio (ref 0.8–1.0)

## 2011-06-07 ENCOUNTER — Ambulatory Visit (HOSPITAL_COMMUNITY)
Admission: RE | Admit: 2011-06-07 | Discharge: 2011-06-07 | Disposition: A | Discharge status: Home / Self Care | Payer: Self-pay | Source: Ambulatory Visit | Attending: Cardiology | Admitting: Cardiology

## 2011-06-07 DIAGNOSIS — I2510 Atherosclerotic heart disease of native coronary artery without angina pectoris: Secondary | ICD-10-CM

## 2011-06-07 DIAGNOSIS — I2789 Other specified pulmonary heart diseases: Secondary | ICD-10-CM | POA: Insufficient documentation

## 2011-06-07 LAB — POCT I-STAT 3, VENOUS BLOOD GAS (G3P V)
Acid-base deficit: 1 mmol/L (ref 0.0–2.0)
Bicarbonate: 24.1 mEq/L — ABNORMAL HIGH (ref 20.0–24.0)
Bicarbonate: 26.5 mEq/L — ABNORMAL HIGH (ref 20.0–24.0)
O2 Saturation: 67 %
O2 Saturation: 69 %
TCO2: 28 mmol/L (ref 0–100)
pCO2, Ven: 42 mmHg — ABNORMAL LOW (ref 45.0–50.0)
pCO2, Ven: 40.3 mmHg — ABNORMAL LOW (ref 45.0–50.0)
pO2, Ven: 36 mmHg (ref 30.0–45.0)

## 2011-06-07 LAB — POCT I-STAT 3, ART BLOOD GAS (G3+)
Acid-Base Excess: 1 mmol/L (ref 0.0–2.0)
Bicarbonate: 24.8 mEq/L — ABNORMAL HIGH (ref 20.0–24.0)
O2 Saturation: 93 %

## 2011-06-08 NOTE — Discharge Summary (Signed)
NAMEMAILY, DEBARGE NO.:  [id number]  MEDICAL RECORD NO.:  [id number]  LOCATION:  5019                         FACILITY:  [facility]  PHYSICIAN:  Marcellus Scott, MD     DATE OF BIRTH:  06-24-1966  DATE OF ADMISSION:  05/14/2011 DATE OF DISCHARGE:  05/19/2011                              DISCHARGE SUMMARY   PRIMARY CARE PHYSICIAN:  The patient does not have one.  DISCHARGE DIAGNOSES: 1. Abdominal pain..  Unclear etiology, resolved. 2. Malignant hypertension. 3. Cardiomyopathy. 4. Tobacco abuse. 5. Macrocytosis. 6. Extensive dental caries. 7. Hypokalemia, repleted. 8. ? Alcohol abuse.  DISCHARGE MEDICATIONS: 1. Amlodipine 5 mg p.o. daily. 2. Hydrochlorothiazide 12.5 mg p.o. daily. 3. Lisinopril 10 mg p.o. b.i.d. 4. Metoprolol 50 mg tablet, 75 mg p.o. b.i.d. 5. Multivitamins 1 tablet p.o. daily. 6. Nicotine 21 mg per 24-hour patch transdermally daily. 7. Thiamine 100 mg p.o. daily. 8. Aspirin 81 mg p.o. daily. 9. Midol over the counter 1-3 tablets p.o. q.4 hourly p.r.n. for     menstrual discomfort.  IMAGING: 1. Orthopantogram, impression, extensive dental caries.  Probable     small periapical abscesses involving the remaining right lower     molars. 2. Chest x-ray May 15, 2011, impression, cardiomegaly and mild     interstitial edema. 3. Nuclear medicine hepatobiliary imaging, impression, A. No evidence     of common bile duct or cystic duct obstruction and therefore no     evidence of acute cholecystitis.  Ultrasound findings are     consistent with either chronic cholecystitis or gallbladder wall     thickening secondary to intrinsic liver disease.  B.  Tracer     retention by the liver is consistent with intrinsic liver disease. 4. Ultrasound of the abdomen, impression, gallbladder wall thickening     with small pericholecystic fluid.  Negative for gallstones.     Findings may indicate acalculous cholecystitis. 5. CT of the abdomen and  pelvis without contrast, impression, A.     Possible cholecystitis.  Limited right upper quadrant abdominal     ultrasound was recommended.  B.  No evidence of urinary tract     calculi or obstruction on either side.  C. Uterine fibroids     suspected.  D.  Marked cardiomegaly with left ventricle     predominance.  E.  Nonspecific shotty retroperitoneal     lymphadenopathy. 6. 2-D echocardiogram without contrast shows mild concentric left     ventricular hypertrophy.  Left ventricular systolic function was     moderately reduced with left ventricular ejection fraction of 35-     40%.  Diffuse hypokinesis..  Moderate mitral valve regurgitation.     Left atrium moderately dilated.  Right ventricle moderately     dilated.  Right atrium moderately dilated.  Moderate tricuspid     regurgitation.  LABORATORY DATA:  Basic metabolic panel today only significant for glucose of 124, hemoglobin A1c 6.4 and 5.8.  TSH was 2.325.  Lipid panel significant for a LDL 105 and HDL 18.  INR 1.13, RBC folate 543, vitamin B12 of 425, lipase 25.  Hepatic panel only significant for total bilirubin 1.6, direct bilirubin 0.4  and indirect bilirubin 1.2.  MCV of 107.5 with normal hemoglobin of 13.6.  Urine drug screen was negative. Admitting lipase 2046.  Admitting LFTs only significant for total bilirubin 1.3.  Urinalysis not suggestive of urinary tract infection.  CONSULTATIONS: 1. Cardiology, Dr. Dietrich Pates. 2. Surgery Dr. Avel Peace. 3. Dental surgeon, Dr. Cindra Eves.  Diet is heart healthy diet.  Activity is ad lib.  Today's complaints none.  PHYSICAL EXAMINATION:  GENERAL:  The patient is in no obvious distress. VITAL SIGNS:  Temperature is 98.2 degrees Fahrenheit, pulse 61 per minute, respirations 18 per minute, blood pressure 166/94 mmHg and saturating at 95% on room air. HEENT:  Oral cavity with very poor oral hygiene with extensive dental caries and partly eroded teeth. RESPIRATORY  SYSTEM:  Clear. CARDIOVASCULAR SYSTEM:  First and second heart sounds heard regular. ABDOMEN:  Nondistended, soft and normal bowel sounds heard. CENTRAL NERVOUS SYSTEM:  Patient is awake, alert, oriented x3 with no focal neurological deficits.  HOSPITAL COURSE:  Ms. Collazos is a 45 year old female patient with history of mitral valve prolapse evaluated 16 years ago, hypertension, although she says she has never been told to have this who was not on medications, prior CVA, tobacco abuse who presented with right upper quadrant pain.  Initial evaluation was suggestive of a calculous cholecystitis.  Surgeons were consulted.  The patient was empirically placed on IV Zosyn.  Surgeons performed HIDA scan which did not reveal acute cholecystitis.  By this time, her abdominal symptoms had resolved and the surgeons signed off.  However, the CT scan had shown significant cardiomegaly.  Also, she presented with malignant hypertension with blood pressure of 229/145 mmHg.  Echocardiogram was requested which showed markedly reduced left ventricle ejection fraction as above with diffuse hypokinesis.  Cardiology was consulted.  They titrated her blood pressure medication and monitored her for 2-3 days consecutively to try and do a cardiac catheterization once her blood pressure was adequately controlled.  However, this could not be achieved.  They indicated that this procedure can be done electively once her blood pressure is adequately controlled, otherwise there is an increased risk of bleeding. They have cleared her for discharge and will followup the patient in their office with an early appointment.  Most of her medications that have been filled should be available on the four dollar prescriptions through the retail pharmacies at a reduced cost to the patient.  The etiology of her initial presentation is unclear, but may be related to some degree of alcohol abuse.  Alcohol cessation has been  counseled as well as tobacco cessation.  If she has recurrence of abdominal pain, then she will have to be reevaluated.  The differential diagnosis of her abdominal pain could be chronic cholecystitis versus intrinsic liver disease in the latter being the more favored..  The patient also had extensive dental caries and on the cardiologist's recommendation Dental consultation was requested but the dentist was unable to see the patient in the hospital.  This dictator discussed with Dr. Kristin Bruins who was happy to see the patient as an outpatient.  Obviously, dental procedures cannot be done until cardiology clearance and addressing endocarditis prophylaxis.  Her macrocytosis may be secondary to alcohol use.  This can be further evaluated as an outpatient.  The patient does not have a primary care physician and the case manager has assisted with followup at [facility].  FOLLOWUP RECOMMENDATIONS: 1. With the [facility] for an eligibility appointment on     May 31, 2011, at 11:30 a.m. 2. With Dr. Allena Katz on June 17, 2011, at 2:30 p.m. 3. With Dr. Tenny Craw or Dr. Riley Kill of [facility].  MD's office     will call her with an appointment. 4. With Dr. Kristin Bruins.  The patient is to call for an appointment.  Time taken in coordinating this discharge is 30 minutes.     Marcellus Scott, MD     AH/MEDQ  D:  05/19/2011  T:  05/19/2011  Job:  [id number]  cc:   Dr. Becky Sax, MD, [facility] Maisie Fus D. Riley Kill, MD, [facility] Cindra Eves, D.D.S.  Electronically Signed by Marcellus Scott MD on 06/08/2011 07:51:19 AM

## 2011-06-09 ENCOUNTER — Other Ambulatory Visit: Payer: Self-pay | Admitting: Internal Medicine

## 2011-06-09 NOTE — Telephone Encounter (Signed)
Pt calling needing refill of lisinopril 20 mg into [facility] on [address].

## 2011-06-10 MED ORDER — LISINOPRIL 20 MG PO TABS: 20.0000 mg | ORAL_TABLET | Freq: Every day | ORAL | Status: DC

## 2011-06-14 ENCOUNTER — Encounter (HOSPITAL_COMMUNITY)
Admission: RE | Admit: 2011-06-14 | Discharge: 2011-06-14 | Disposition: A | Discharge status: Home / Self Care | Payer: Self-pay | Source: Ambulatory Visit | Attending: Dentistry | Admitting: Dentistry

## 2011-06-14 ENCOUNTER — Other Ambulatory Visit: Payer: Self-pay | Admitting: *Deleted

## 2011-06-14 ENCOUNTER — Telehealth: Payer: Self-pay | Admitting: Internal Medicine

## 2011-06-14 DIAGNOSIS — I10 Essential (primary) hypertension: Secondary | ICD-10-CM

## 2011-06-14 MED ORDER — CARVEDILOL 12.5 MG PO TABS: 12.5000 mg | ORAL_TABLET | Freq: Two times a day (BID) | ORAL | Status: DC

## 2011-06-14 MED ORDER — HYDROCHLOROTHIAZIDE 12.5 MG PO CAPS: 12.5000 mg | ORAL_CAPSULE | Freq: Every day | ORAL | Status: DC

## 2011-06-14 MED ORDER — AMLODIPINE BESYLATE 5 MG PO TABS: 5.0000 mg | ORAL_TABLET | Freq: Every day | ORAL | Status: DC

## 2011-06-14 MED ORDER — LISINOPRIL 20 MG PO TABS: 20.0000 mg | ORAL_TABLET | Freq: Two times a day (BID) | ORAL | Status: DC

## 2011-06-14 NOTE — Telephone Encounter (Signed)
Pt forgot to ask to refill carvedilol, refill complete.

## 2011-06-14 NOTE — Telephone Encounter (Signed)
Pt walked into office needing refills. Pt is going to have all teeth pulled 06/16/11 and will be unable to keep app with Dr Tenny Craw 10/26  due to pain meds/driving. Will need to be rescheduled for post LHC  F/U. Reviewed note and lisinoril 20 mg bid is not reflected in her MAR but is noted in ov with PA Weaver. Pt is taking med as prescribed/ 20 mg BID.  MAR changed, labs were gotten on 10/15 as requested and bmet was satisfactory. I refilled meds with only one refill to insure return visit.  Pt needs App but Dr Tenny Craw schedule for Monday is blocked for DOD app, will forward to [facility] for Dr Tenny Craw to see if a fit in would be appropriate. Alfonso Ramus RN

## 2011-06-14 NOTE — Telephone Encounter (Signed)
Pt returning your call

## 2011-06-16 ENCOUNTER — Ambulatory Visit (HOSPITAL_COMMUNITY)
Admission: RE | Admit: 2011-06-16 | Discharge: 2011-06-16 | Disposition: A | Discharge status: Home / Self Care | Payer: Self-pay | Source: Ambulatory Visit | Attending: Dentistry | Admitting: Dentistry

## 2011-06-16 ENCOUNTER — Telehealth: Payer: Self-pay | Admitting: *Deleted

## 2011-06-16 DIAGNOSIS — I059 Rheumatic mitral valve disease, unspecified: Secondary | ICD-10-CM | POA: Insufficient documentation

## 2011-06-16 DIAGNOSIS — K083 Retained dental root: Secondary | ICD-10-CM | POA: Insufficient documentation

## 2011-06-16 DIAGNOSIS — Z8673 Personal history of transient ischemic attack (TIA), and cerebral infarction without residual deficits: Secondary | ICD-10-CM | POA: Insufficient documentation

## 2011-06-16 DIAGNOSIS — R29810 Facial weakness: Secondary | ICD-10-CM | POA: Insufficient documentation

## 2011-06-16 DIAGNOSIS — I10 Essential (primary) hypertension: Secondary | ICD-10-CM | POA: Insufficient documentation

## 2011-06-16 DIAGNOSIS — K045 Chronic apical periodontitis: Secondary | ICD-10-CM

## 2011-06-16 DIAGNOSIS — F17200 Nicotine dependence, unspecified, uncomplicated: Secondary | ICD-10-CM | POA: Insufficient documentation

## 2011-06-16 DIAGNOSIS — Z01812 Encounter for preprocedural laboratory examination: Secondary | ICD-10-CM | POA: Insufficient documentation

## 2011-06-16 DIAGNOSIS — Q381 Ankyloglossia: Secondary | ICD-10-CM | POA: Insufficient documentation

## 2011-06-16 DIAGNOSIS — I428 Other cardiomyopathies: Secondary | ICD-10-CM | POA: Insufficient documentation

## 2011-06-16 DIAGNOSIS — K0530 Chronic periodontitis, unspecified: Secondary | ICD-10-CM

## 2011-06-16 DIAGNOSIS — M2607 Excessive tuberosity of jaw: Secondary | ICD-10-CM | POA: Insufficient documentation

## 2011-06-16 DIAGNOSIS — M278 Other specified diseases of jaws: Secondary | ICD-10-CM

## 2011-06-16 DIAGNOSIS — I2789 Other specified pulmonary heart diseases: Secondary | ICD-10-CM | POA: Insufficient documentation

## 2011-06-16 DIAGNOSIS — I2510 Atherosclerotic heart disease of native coronary artery without angina pectoris: Secondary | ICD-10-CM | POA: Insufficient documentation

## 2011-06-16 LAB — POCT I-STAT, CHEM 8
Calcium, Ion: 1.19 mmol/L (ref 1.12–1.32)
Creatinine, Ser: 1 mg/dL (ref 0.50–1.10)
Glucose, Bld: 95 mg/dL (ref 70–99)
HCT: 45 % (ref 36.0–46.0)
Hemoglobin: 15.3 g/dL — ABNORMAL HIGH (ref 12.0–15.0)
Potassium: 4.6 mEq/L (ref 3.5–5.1)
TCO2: 23 mmol/L (ref 0–100)

## 2011-06-16 NOTE — Telephone Encounter (Signed)
LMOM apt changed to 10/29 with Dr.Ross. Call back to confirm.

## 2011-06-16 NOTE — Telephone Encounter (Signed)
LMOM that she can be seen on 10/29 at 1130 am if she can make it. Asked to call back.

## 2011-06-16 NOTE — Op Note (Signed)
Ashley [name], Ashley [name] NO.:  [id number]  MEDICAL RECORD NO.:  [id number]  LOCATION:  [facility]SC                         FACILITY:  [facility]  PHYSICIAN:  Cindra Eves, D.D.S.DATE OF BIRTH:  1965/10/17  DATE OF PROCEDURE:  06/16/2011 DATE OF DISCHARGE:                              OPERATIVE REPORT   PREOPERATIVE DIAGNOSES: 1. Heart valve disease. 2. Preheart valve surgery dental protocol. 3. Cardiomyopathy. 4. Chronic periodontitis. 5. Apical periodontitis. 6. Multiple retained root segments. 7. Bilateral mandibular lateral exostoses. 8. Bilateral maxillary excessive tuberosities.  POSTOPERATIVE DIAGNOSES: 1. Heart valve disease. 2. Preheart valve surgery dental protocol. 3. Cardiomyopathy. 4. Chronic periodontitis. 5. Apical periodontitis. 6. Multiple retained root segments. 7. Bilateral mandibular lateral exostoses. 8. Bilateral maxillary excessive tuberosities. 9. Apically positioned maxillary labial frenum.  OPERATIONS: 1. Extraction of remaining teeth, (tooth numbers 2, 3, 4, 5, 6, 7, 8,     9, 10, 11, 12, 13, 14, 15, 20, 21, 22, 23, 24, 25, 26, 27, 28, 29,     30, and 31). 2. Four quadrants of alveoloplasty. 3. Bilateral maxillary tuberosity reductions. 4. Bilateral mandibular lingual lateral exostoses reductions. 5. Maxillary labial Frenectomy.  SURGEON:  Cindra Eves, DDS  ASSISTANT:  Cindy Hazy (dental assistant)  ANESTHESIA:  General anesthesia via nasoendotracheal tube.  MEDICATIONS: 1. Ancef 2 g IV prior to invasive dental procedures. 2. Local anesthesia with a total utilization of 6 carpules each     containing 34 mg of lidocaine with 0.017 mg of epinephrine as well     as 2 carpules each containing 9 mg of bupivacaine with 0.009 mg of     epinephrine.  SPECIMENS:  There were 26 teeth that were discarded.  DRAINS:  None.  CULTURES:  None.  COMPLICATIONS:  None.  ESTIMATED BLOOD LOSS:  150 mL.  FLUIDS:  1600 mL of  lactated Ringer solution.  INDICATIONS:  The patient has been recently diagnosed with significant heart valve disease.  The patient with probable heart valve surgery repair or placement in the future.  The patient referred for evaluation of poor dentition and to rule out dental infection that may affect the patient's systemic health, cause possible endocarditis, or predispose the anticipated heart valve surgery to infection.  The patient was therefore examined and treatment planned for extraction of all remaining teeth with alveoloplasty and preprosthetic surgery as indicated.  OPERATIVE FINDINGS:  The patient was examined in the operating room #3. The teeth were identified for extraction.  The patient noted to be affected by chronic periodontitis, apical periodontitis, multiple retained root segments, bilateral excessive maxillary tuberosities, bilateral mandibular lingual lateral exostoses, and the presence of an apically positioned maxillary labial frenum.  At this time, it was determined that the frenectomy would need to be added to the procedures above.  The aforementioned necessitated removal of all remaining teeth with alveoloplasty and preprosthetic surgery was indicated.  DESCRIPTION OF PROCEDURE:  The patient was brought to the main operating room #2.  The patient was then placed in supine position on the operating room table.  General anesthesia was then induced via an nasoendotracheal tube.  The patient was then prepped and draped in usual manner for dental medicine  procedure.  A time-out was performed.  The patient was identified and procedures were verified.  Throat pack was placed at this time.  The oral cavity was then thoroughly examined with the findings as noted above.  The patient was then ready for the dental medicine procedure as follows.  Local anesthesia was administered sequentially with a total utilization of 6 carpules each containing 34 mg of lidocaine with  0.017 mg of epinephrine as well as 2 carpules each containing 9 mg of bupivacaine with 0.009 mg of epinephrine.  The maxillary left and right quadrants were first approached. Anesthesia delivered via infiltration utilizing lidocaine with epinephrine.  At this point in time, a #15 blade incision was made from the maxillary right tuberosity and extended to the maxillary left tuberosity.  A surgical flap was then carefully reflected.  Appropriate amounts of buccal and interseptal bone was removed as indicated with the surgical handpiece and bur and copious amounts of sterile saline.  The teeth were then subluxated with a series of straight elevators.  Tooth #2 was then removed with a 53R forceps without complications.  The retained roots in the area of tooth numbers 3, 4, 5, 6, 7, 8, 9, 10, 11, 12, and 13 were then removed with a 150 forceps without complications.  Retained roots in the area of #14 were then removed with a 53L forceps and tooth #15 was then removed with a 53L forceps, all without complications. Alveoplasty was then performed utilizing rongeurs and bone file.  The tissues were approximated and trimmed appropriately.  At this point in time, bilateral maxillary tuberosity reductions were achieved utilizing a #15 blade and soft tissue pickups to remove the redundant tissue.  The tuberosity tissues were undermined and reduced appropriately once again. At this point in time, the surgical site was irrigated with copious amounts sterile saline x4.  The tissues were reapproximated and trimmed appropriately.  The maxillary right surgical site was then closed from the maxillary right tuberosity and extended to the mesial #8 utilizing 3- 0 chromic gut suture in a continuous subcu suture technique x1.  The maxillary left surgical site was then closed from the maxillary left tuberosity and extended through the mesial of #9 utilizing 3-0 chromic gut suture in a continuous subcu suture  technique x1.    At this point in time, the maxillary labial frenum was approached.  A curved hemostat was used to grab the maxillary labial frenum appropriately.  A #15 blade incisions were made to remove the frenum.  The tissues were undermined appropriately.  Surgical site was then irrigated with copious amounts of sterile saline.  The surgical site was then closed in a straight line fashion from the tip of the alveolar ridge to the appropriate amount of soft tissue of the upper inner lip.  Six interrupted sutures were utilized utilizing 4-0 chromic gut material.  At this point in time, the mandibular quadrants were approached.  The patient was given bilateral inferior alveolar nerve blocks and long buccal nerve blocks utilizing the bupivacaine with epinephrine.  Further infiltration was then achieved utilizing lidocaine with epinephrine.  At this point in time, #15 blade incision was made from the distal of #19 and extended to the distal #32.  Surgical flap was then carefully reflected.  Appropriate amounts of buccal and interseptal bone were removed with a surgical handpiece and bur and copious amounts of sterile saline.  The teeth were then subluxated with a series of straight elevators.  Retained roots in the area  of tooth numbers 20, 21, 22, 23, 24, 25, 26, 27 were then removed with the 151 forceps without complications.  Further bone was then removed around tooth numbers 28, 29, 30, and 31 appropriately.  The teeth were then re-subluxated.  Tooth numbers 28 and 29 were then removed with the 151 forceps without complications.  The coronal aspects of tooth number 30 were then removed with a 23 forceps leaving the roots remaining.  Further bone was then removed and the roots in areas 30 and 31 were then removed with a series of Cryers elevators.  At this point in time, alveoloplasty was then performed utilizing rongeurs and bone file.  The bilateral lateral exostoses were then  visualized and removed with a surgical handpiece and bur and copious amounts of saline.  Further alveoloplasty was performed as indicated with the rongeurs and bone file.  At this point in time, the tissues were approximated and trimmed appropriately.  The surgical sites were then irrigated with copious amounts of sterile saline x4. The mandibular left surgical site was then closed from distal 19 and extended to the mesial #24 utilizing 3-0 chromic gut suture in a continuous septal suture technique x1.  One individual interrupted suture was then placed to further close the surgical site.  At this point in time, the mandibular right surgical site was closed from distal #32 and extended to the mesial #25 utilizing 3-0 chromic gut suture in a continuous septal suture technique x1.  Two individual interrupted sutures were then placed to further close the surgical site.    At this point in time, the entire mouth was irrigated with copious amounts of sterile saline.  The patient was examined for complications, seeing none, dental medicine procedure deemed to be complete.  Throat pack was removed at this time.  A series of 4 x 4 gauze were placed in the mouth to aid hemostasis.  The patient then handed over to the Anesthesia Team for final disposition.  After appropriate amount of time, the patient was extubated and taken to the Postanesthesia Care Unit with stable vital signs in good condition. All counts were correct for the dental medicine  procedure.  The patient was given appropriate pain medication and is to utilize  1-2 tablets every 4-6 hours of Percocet 5/325.  The patient will be seen approximately 7-10 days for evaluation for suture removal.          ______________________________ Cindra Eves, D.D.S.     RK/MEDQ  D:  06/16/2011  T:  06/16/2011  Job:  [id number]  cc:   Marcellus Scott, MD Arturo Morton. Riley Kill, MD, [facility] Pricilla Riffle, MD, [facility]  Electronically Signed by Cindra Eves D.D.S. on 06/16/2011 04:27:53 PM

## 2011-06-17 ENCOUNTER — Ambulatory Visit: Payer: Self-pay | Admitting: Internal Medicine

## 2011-06-20 ENCOUNTER — Encounter: Payer: Self-pay | Admitting: Internal Medicine

## 2011-06-20 ENCOUNTER — Ambulatory Visit (INDEPENDENT_AMBULATORY_CARE_PROVIDER_SITE_OTHER): Payer: Self-pay | Admitting: Internal Medicine

## 2011-06-20 DIAGNOSIS — I10 Essential (primary) hypertension: Secondary | ICD-10-CM

## 2011-06-20 DIAGNOSIS — I2510 Atherosclerotic heart disease of native coronary artery without angina pectoris: Secondary | ICD-10-CM

## 2011-06-20 DIAGNOSIS — I119 Hypertensive heart disease without heart failure: Secondary | ICD-10-CM

## 2011-06-20 DIAGNOSIS — I428 Other cardiomyopathies: Secondary | ICD-10-CM

## 2011-06-20 DIAGNOSIS — E785 Hyperlipidemia, unspecified: Secondary | ICD-10-CM

## 2011-06-20 DIAGNOSIS — I340 Nonrheumatic mitral (valve) insufficiency: Secondary | ICD-10-CM

## 2011-06-20 DIAGNOSIS — I059 Rheumatic mitral valve disease, unspecified: Secondary | ICD-10-CM

## 2011-06-20 LAB — POCT I-STAT 4, (NA,K, GLUC, HGB,HCT): Glucose, Bld: 98 mg/dL (ref 70–99)

## 2011-06-20 MED ORDER — AMLODIPINE BESYLATE 10 MG PO TABS: 10.0000 mg | ORAL_TABLET | Freq: Every day | ORAL | Status: DC

## 2011-06-20 NOTE — Patient Instructions (Addendum)
TEE scheduled for 06/30/2011 with Dr.Ross Report to ENDO UNIT at [facility] at 9 am. Nothing to eat or drink after midnight. Ok to take medication with sip of water.    Norvasc increased to 10mg  every day.

## 2011-06-20 NOTE — Progress Notes (Signed)
HPI   Patient is a 45 year old with a history of HTN, SOB, CVA, LV dysfunction and MR.   She recently underwent R and L heart catheterization.  This showed mild increase in R sided pressures.  (RA 7; RV 39/7; PAP 38/15; PCWP 15; CI 2.8 L/min/m2, LV 134/12).  L heart cath showed:  LVEF 45 to 50%;  MR was at least moderate.  Tiny D1 with 90%; D2 80%  LAD 70 to 8-% distal.  LCX with 70% ostial; 75% OM; RCA 70 to 80% mid. T.Stuckey reivewed films with surgery.  Plan was for TEE to evaluate MR more closely. SInce seen the patient has remained on medical Rx.  Still gets SOB.  Just had teeth pulled last week.    No Known Allergies  Current Outpatient Prescriptions  Medication Sig Dispense Refill  . amLODipine (NORVASC) 5 MG tablet Take 1 tablet (5 mg total) by mouth daily.  30 tablet  1  . aspirin EC 81 MG tablet Take 1 tablet (81 mg total) by mouth daily.      . carvedilol (COREG) 12.5 MG tablet Take 1 tablet (12.5 mg total) by mouth 2 (two) times daily with a meal.  60 tablet  1  . famotidine (PEPCID) 20 MG tablet Take 20 mg by mouth as needed.        . hydrochlorothiazide (MICROZIDE) 12.5 MG capsule Take 1 capsule (12.5 mg total) by mouth daily.  30 capsule  1  . lisinopril (PRINIVIL,ZESTRIL) 20 MG tablet Take 1 tablet (20 mg total) by mouth 2 (two) times daily.  60 tablet  1  . nicotine (NICODERM CQ - DOSED IN MG/24 HOURS) 21 mg/24hr patch Place 1 patch onto the skin daily.        [name] oxyCODONE-acetaminophen (PERCOCET) 5-325 MG per tablet Take 1 tablet by mouth every 4 (four) hours as needed.          Past Medical History  Diagnosis Date  . Abdominal pain     ? chronic cholecystitis; admxn to [facility] 9/12 (CT, USN, HIDA done)  . Stroke     in setting of cigs and OCPs  . MVP (mitral valve prolapse)   . Cardiomyopathy     echo 9/12: mild LVH, EF 35-40%, mod MR (difficult to judge /post leaflet restricted), mod BAE, mod RVE, mod TR  . HTN (hypertension)   . Tobacco abuse     Past Surgical  History  Procedure Date  . Cesarean section     Family History  Problem Relation Age of Onset  . Heart disease Neg Hx   . Stroke Neg Hx     History   Social History  . Marital Status: Married    Spouse Name: N/A    Number of Children: N/A  . Years of Education: N/A   Occupational History  . Not on file.   Social History Main Topics  . Smoking status: Former Games developer  . Smokeless tobacco: Not on file  . Alcohol Use: Not on file  . Drug Use: Not on file  . Sexually Active: Not on file   Other Topics Concern  . Not on file   Social History Narrative  . No narrative on file    Review of Systems:  All systems reviewed.  They are negative to the above problem except as previously stated.  Vital Signs: BP 166/82  Pulse 65  Ht 5\' 8"  (1.727 m)  Wt 182 lb (82.555 kg)  BMI 27.67 kg/m2  Physical Exam  Patient is in NAD  HEENT:  Normocephalic, atraumatic. EOMI, PERRLA.  Neck: JVP is normal. No thyromegaly. No bruits.  Lungs: clear to auscultation. No rales no wheezes.  Heart: Regular rate and rhythm. Normal S1, S2. No S3.   Gr II/VI systolic murmur at apex.[name] PMI not displaced.  Abdomen:  Supple, nontender. Normal bowel sounds. No masses. No hepatomegaly.  Extremities:   Good distal pulses throughout. No lower extremity edema.  Musculoskeletal :moving all extremities.  Neuro:   alert and oriented x3.  CN II-XII grossly intact.   EKG:  Sinus bradycardia.  51 bpm.    Assessment and Plan:

## 2011-06-21 DIAGNOSIS — I340 Nonrheumatic mitral (valve) insufficiency: Secondary | ICD-10-CM | POA: Insufficient documentation

## 2011-06-21 DIAGNOSIS — I2510 Atherosclerotic heart disease of native coronary artery without angina pectoris: Secondary | ICD-10-CM | POA: Insufficient documentation

## 2011-06-21 DIAGNOSIS — E785 Hyperlipidemia, unspecified: Secondary | ICD-10-CM | POA: Insufficient documentation

## 2011-06-21 NOTE — Assessment & Plan Note (Signed)
LDL is 105  Needs to be on a statin.  Rx 20 Zocor.

## 2011-06-21 NOTE — Assessment & Plan Note (Signed)
Significant CAD.  WIll schedule toTEE to evaluate if surg candidate for CABG and MVR.

## 2011-06-21 NOTE — Assessment & Plan Note (Signed)
Volume status is OK.

## 2011-06-21 NOTE — Assessment & Plan Note (Signed)
Will set up for TEE to evaluate MR.

## 2011-06-21 NOTE — Assessment & Plan Note (Signed)
Will increase norvasc.

## 2011-06-25 NOTE — Cardiovascular Report (Signed)
Ashley [name], Ashley [name] NO.:  [id number]  MEDICAL RECORD NO.:  [id number]  LOCATION:  [facility]                         FACILITY:  [facility]  PHYSICIAN:  Arturo Morton. Riley Kill, MD, FACCDATE OF BIRTH:  September 22, 1965  DATE OF PROCEDURE:  06/07/2011 DATE OF DISCHARGE:                           CARDIAC CATHETERIZATION   INDICATIONS:  Ashley [name] is a very delightful 45 year old who presented with actually abdominal pain.  She was admitted and had some pulmonary edema.  She had severe hypertension.  Her ejection fraction was reduced at 35-40%.  She stabilized, and subsequently was seen in consultation by Cardiology.  Blood pressure control initially was difficult, so she was discharged with increasing medications.  Her blood pressure is now substantially improved and she has stopped smoking.  Her echocardiogram demonstrated an ejection fraction of 35-40% with moderate mitral regurgitation.  The current study is done to assess her coronary anatomy, her LV function, right heart pressures, and a degree of mitral regurgitation.  PROCEDURE: 1. Right and left heart catheterization. 2. Selective coronary arteriography. 3. Selective left ventriculography.  DESCRIPTION OF THE PROCEDURE:  The procedure was performed from the right femoral vein and right femoral artery.  A Smart needle was used for access to both vessels.  A 7-French thermodilution Swan-Ganz catheter was utilized, 5-French diagnostic catheters were utilized. There were no major complications.  She was taken to the holding area in satisfactory clinical condition.  HEMODYNAMIC DATA: 1. Right atrial pressure 7. 2. RV 39/7. 3. Pulmonary 38/15, mean 24. 4. Pulmonary capillary wedge 15. 5. Aortic 149/69, mean 94. 6. LV 134/12. 7. No mitral or aortic gradient. 8. Fick cardiac output 5.51 L/minute. 9. Fick cardiac index 2.73 L/minute/m2. 10.Thermodilution cardiac output 5.7 L/minute. 11.Thermodilution cardiac index 2.8  L/minute/m2. 12.Superior vena cava saturation 67%. 13.Pulmonary artery saturation 69%. 14.Aortic saturation 93%.  ANGIOGRAPHIC DATA: 1. Ventriculography done in the RAO projection reveals what appears to     be improved overall left ventricular function.  Estimated ejection     fraction at this point in time would be in the 45-50% range.  There     is at least moderate mitral regurgitation noted. 2. On plain fluoroscopy, there is generalized calcification of the     coronary vessels. 3. The left main is without critical disease. 4. The left anterior descending artery courses to the apex.  There is     a tiny first diagonal branch with tandem 90% lesions.  Basically,     it is subtotally occluded in its midportion.  The LAD beyond this     is heavily calcified but opens up into a septal and diagonal.  The     second diagonal has probably 40% plaquing proximally, and then an     80% segmental lesion at the origin of the side branch.  The distal     vessel is modest in size, could be potentially grafted.  The LAD     proper has approximately 70-80% focal stenosis more distally to the     second diagonal takeoff, and the distal vessel has mild luminal     irregularities and wraps the apical tip.  It would be a suitable  vessel for grafting. 5. The circumflex is a fairly large caliber vessel.  There is ostial     narrowing of about 70% which is smooth.  There is a mid stenosis     into the large bifurcating marginal about 75% which is eccentric. 6. The right coronary artery is segmentally diseased in the proximal     mid junction with 70-80% narrowing.  Distally, there is luminal     irregularity but no critical disease with a large posterior     descending and posterolateral segment.  CONCLUSIONS: 1. Improved left ventricular overall systolic function with probable     at least moderate mitral regurgitation. 2. Mild pulmonary hypertension secondary to improved left ventricular      overall systolic function with probable at least moderate mitral     regurgitation. 3. Scattered three-vessel coronary artery disease as described in the     above text.  DISPOSITION:  The patient does not have unstable chest pain nor she had significant chest pain.  She does have significant three-vessel disease. It is unlikely that this accounts for her LV dysfunction.  The LV dysfunction seems to be more related to the uncontrolled hypertension and is  substantially improved.  Her mitral regurgitation is at least moderate.  There was an eccentric jet noted by transthoracic echo.  A TEE would probably be helpful and if there is substantial mitral regurgitation, some consideration should be given to mitral valve repair and revascularization surgery.  The alternative to this would be aggressive medical therapy.  I have reviewed and discussed with Dr. Excell Seltzer, and I will get the opinion of the cardiovascular surgeons and an appropriate team approach.     Arturo Morton. Riley Kill, MD, [facility]     TDS/MEDQ  D:  06/07/2011  T:  06/07/2011  Job:  [id number]  cc:   CV Laboratory  Electronically Signed by Shawnie Pons MD [facility] on 06/25/2011 09:13:32 AM

## 2011-06-29 ENCOUNTER — Ambulatory Visit (HOSPITAL_COMMUNITY): Payer: Self-pay | Admitting: Dentistry

## 2011-06-29 VITALS — BP 140/76 | HR 63 | Temp 98.0°F

## 2011-06-29 DIAGNOSIS — K08109 Complete loss of teeth, unspecified cause, unspecified class: Secondary | ICD-10-CM

## 2011-06-29 DIAGNOSIS — K130 Diseases of lips: Secondary | ICD-10-CM

## 2011-06-29 DIAGNOSIS — K08409 Partial loss of teeth, unspecified cause, unspecified class: Secondary | ICD-10-CM

## 2011-06-29 NOTE — Progress Notes (Signed)
  BP: 140/76             P: 63           T: 98.0  Ashley [name] is a 45 year old female with heart valve disease and need for possible heart valve surgery. Patient was seen as part of a pre-heart valve surgery dental protocol evaluation. Patient subsequently underwent extraction of remaining teeth with alveoloplasty and pre-prosthetic surgery as indicated in the operating room on 06/16/2011.  Patient now presents for evaluation of healing and suture removal as indicated.   Subjective: Patient denies significant dental discomfort. Patient has a few sutures remaining.  Exam: There is no sign of infection, heme or ooze. Sutures are loosely intact. Patient is healing in by generalized primary closure. Patient has angular cheilitis involving the left commissure.  Assessment: Postop course is consistent with dental procedures in the operating room.  Plan/recommendations: 1. Sutures will be removed today. Thirty second chlorhexidine rinse. Sutures removed without complications. 2. Patient's continue saltwater rinses every 2 hours as needed to aid healing. 3. Patient is to continue soft diet at this time advance as tolerated. Patient given instruction on diet with special emphasis on protein intake. 4. Patient is now cleared for heart valve surgery if indicated. 5. Patient to return to clinic in approximately one month for evaluation for start of dentures. Patient to contact dental medicine if she does obtain dental Medicaid. Price quote for dentures were provided. 6. Patient to contact dental medicine if acute problems arise in the interim. 7. Apply triple antibiotic ointment to the corners of her mouth 4 times daily as needed Dr. Cindra Eves

## 2011-06-30 ENCOUNTER — Encounter (HOSPITAL_COMMUNITY)
Admission: RE | Disposition: A | Discharge status: Home / Self Care | Payer: Self-pay | Source: Ambulatory Visit | Attending: Internal Medicine

## 2011-06-30 ENCOUNTER — Ambulatory Visit (HOSPITAL_COMMUNITY)
Admission: RE | Admit: 2011-06-30 | Discharge: 2011-06-30 | Disposition: A | Discharge status: Home / Self Care | Payer: Self-pay | Source: Ambulatory Visit | Attending: Internal Medicine | Admitting: Internal Medicine

## 2011-06-30 ENCOUNTER — Encounter (HOSPITAL_COMMUNITY): Payer: Self-pay

## 2011-06-30 DIAGNOSIS — I059 Rheumatic mitral valve disease, unspecified: Secondary | ICD-10-CM

## 2011-06-30 DIAGNOSIS — I700 Atherosclerosis of aorta: Secondary | ICD-10-CM | POA: Insufficient documentation

## 2011-06-30 HISTORY — PX: TEE WITHOUT CARDIOVERSION: SHX5443

## 2011-06-30 SURGERY — ECHOCARDIOGRAM, TRANSESOPHAGEAL
Anesthesia: Moderate Sedation

## 2011-06-30 MED ORDER — SODIUM CHLORIDE 0.9 % IJ SOLN: 3.0000 mL | INTRAMUSCULAR | Status: DC | PRN

## 2011-06-30 MED ORDER — MIDAZOLAM HCL 10 MG/2ML IJ SOLN
INTRAMUSCULAR | Status: AC
  Filled 2011-06-30: qty 2

## 2011-06-30 MED ORDER — LIDOCAINE VISCOUS 2 % MT SOLN
OROMUCOSAL | Status: DC | PRN
  Administered 2011-06-30: 1

## 2011-06-30 MED ORDER — FENTANYL CITRATE 0.05 MG/ML IJ SOLN
INTRAMUSCULAR | Status: AC
  Filled 2011-06-30: qty 2

## 2011-06-30 MED ORDER — SODIUM CHLORIDE 0.9 % IV SOLN: 250.0000 mL | INTRAVENOUS | Status: DC

## 2011-06-30 MED ORDER — FENTANYL CITRATE 0.05 MG/ML IJ SOLN
INTRAMUSCULAR | Status: DC | PRN
  Administered 2011-06-30 (×2): 25 ug via INTRAVENOUS

## 2011-06-30 MED ORDER — SODIUM CHLORIDE 0.9 % IV SOLN
INTRAVENOUS | Status: DC
  Administered 2011-06-30: 10:00:00 via INTRAVENOUS

## 2011-06-30 MED ORDER — LIDOCAINE VISCOUS 2 % MT SOLN
OROMUCOSAL | Status: AC
  Filled 2011-06-30: qty 15

## 2011-06-30 MED ORDER — SODIUM CHLORIDE 0.9 % IJ SOLN: 3.0000 mL | Freq: Two times a day (BID) | INTRAMUSCULAR | Status: DC

## 2011-06-30 MED ORDER — MIDAZOLAM HCL 10 MG/2ML IJ SOLN
INTRAMUSCULAR | Status: DC | PRN
  Administered 2011-06-30 (×3): 2 mg via INTRAVENOUS

## 2011-06-30 NOTE — Progress Notes (Signed)
Bubble study per protocol times one- negative

## 2011-06-30 NOTE — Progress Notes (Signed)
*  PRELIMINARY RESULTS* Echocardiogram Echocardiogram Transesophageal has been performed.  Glean Salen [facility] RDCS 06/30/2011, 4:26 PM

## 2011-07-06 ENCOUNTER — Telehealth: Payer: Self-pay | Admitting: Internal Medicine

## 2011-07-06 NOTE — Telephone Encounter (Signed)
Pt called. Pt wants to know test results and she wants to know about  when surgery is scheduled. Please call

## 2011-07-06 NOTE — Telephone Encounter (Signed)
I spoke with the patient. She is a little confused at this point. She states she was called Friday about an appointment with the surgeon. She states her TEE results were not discussed with her. She has an appointment with Dr. Cornelius Moras on 07/11/11 at 2:45pm. She states she will most likely not be able to make that appointment because of the time and having to pick up her kids. I advised I will not try to r/s her appointment yet, but will ask that Dr. Tenny Craw give her a call on Friday when she is back in the office to discuss her TEE findings. She is agreeable with this.

## 2011-07-08 ENCOUNTER — Encounter: Payer: Self-pay | Admitting: *Deleted

## 2011-07-11 ENCOUNTER — Institutional Professional Consult (permissible substitution) (INDEPENDENT_AMBULATORY_CARE_PROVIDER_SITE_OTHER): Payer: Self-pay | Admitting: Thoracic Surgery (Cardiothoracic Vascular Surgery)

## 2011-07-11 ENCOUNTER — Encounter: Payer: Self-pay | Admitting: Thoracic Surgery (Cardiothoracic Vascular Surgery)

## 2011-07-11 VITALS — BP 158/76 | HR 68 | Resp 20 | Ht 68.0 in | Wt 182.0 lb

## 2011-07-11 DIAGNOSIS — I340 Nonrheumatic mitral (valve) insufficiency: Secondary | ICD-10-CM

## 2011-07-11 DIAGNOSIS — I2510 Atherosclerotic heart disease of native coronary artery without angina pectoris: Secondary | ICD-10-CM

## 2011-07-11 DIAGNOSIS — I059 Rheumatic mitral valve disease, unspecified: Secondary | ICD-10-CM

## 2011-07-11 NOTE — Progress Notes (Signed)
[address]            [name] [address]          [phone]     CARDIOTHORACIC SURGERY CONSULTATION REPORT  PCP is Dietrich Pates, MD, MD Referring Provider is Pricilla Riffle, MD  Chief Complaint  Patient presents with  . Mitral Regurgitation    Referral from Dr Tenny Craw for surgial eval, servere mitral regurgitation, TEE 06/30/11    HPI:  Patient is a 45 year old married white female from [place] with history of heart murmur discovered 20 years ago when the patient suffered a stroke at age 12. She was told that she had mitral valve prolapse at that time. She otherwise has no cardiac history and had not seen a physician in many years. She was hospitalized in September 2012 because of right-sided abdominal discomfort. At that time there was some question as to whether or not she might have cholecystitis, but a high scan was negative. Gallbladder ultrasound revealed thickened gallbladder wall. The patient was seen in consultation by Dr. Renee Harder from the general surgery service at that time. Her symptoms resolved and were thought perhaps to be related to passive congestion of the liver. She was also noted to have severe mitral regurgitation with moderate left ventricular dysfunction. She had severe hypertension that was poorly controlled at the time of her initial hospital presentation. Since then she has been followed carefully by Dr. Tenny Craw. She ultimately underwent left and right heart catheterization by Dr. Riley Kill 06/07/2011. This revealed severe three-vessel coronary artery disease with moderate left ventricular dysfunction and at least moderate mitral regurgitation. The patient has been seen back since then in followup by Dr. Tenny Craw and underwent transesophageal echocardiogram 06/30/2011. This revealed severe mitral regurgitation. The patient has been referred for possible elective surgical intervention.  Past Medical History  Diagnosis Date  . Abdominal pain      ? chronic cholecystitis; admxn to [facility] 9/12 (CT, USN, HIDA done)  . Stroke     in setting of cigs and OCPs  . Cardiomyopathy     echo 9/12: mild LVH, EF 35-40%, mod MR (difficult to judge /post leaflet restricted), mod BAE, mod RVE, mod TR  . HTN (hypertension)   . Tobacco abuse   . Heart murmur   . Coronary artery disease   . Mitral regurgitation     Past Surgical History  Procedure Date  . Tonsillectomy   . Tubal ligation   . Cesarean section   . Cesarean section     Family History  Problem Relation Age of Onset  . Heart disease Neg Hx   . Stroke Neg Hx     Social History History  Substance Use Topics  . Smoking status: Former Games developer  . Smokeless tobacco: Former Neurosurgeon    Quit date: 06/06/2011  . Alcohol Use: No    Current Outpatient Prescriptions  Medication Sig Dispense Refill  . amLODipine (NORVASC) 10 MG tablet Take 1 tablet (10 mg total) by mouth daily.  30 tablet  11  . aspirin EC 81 MG tablet Take 1 tablet (81 mg total) by mouth daily.      . carvedilol (COREG) 12.5 MG tablet Take 1 tablet (12.5 mg total) by mouth 2 (two) times daily with a meal.  60 tablet  1  . famotidine (PEPCID) 20 MG tablet Take 20 mg by mouth as needed.        [name]  hydrochlorothiazide (MICROZIDE) 12.5 MG capsule Take 1 capsule (12.5 mg total) by mouth daily.  30 capsule  1  . lisinopril (PRINIVIL,ZESTRIL) 20 MG tablet Take 1 tablet (20 mg total) by mouth 2 (two) times daily.  60 tablet  1  . nicotine (NICODERM CQ - DOSED IN MG/24 HOURS) 21 mg/24hr patch Place 1 patch onto the skin daily.        [name] oxyCODONE-acetaminophen (PERCOCET) 5-325 MG per tablet Take 1 tablet by mouth every 4 (four) hours as needed.          No Known Allergies  Review of Systems:  General:  normal appetite, normal energy   Respiratory:  no cough, no wheezing, no hemoptysis, no pain with inspiration or cough, very mild shortness of breath with activity  Cardiac:   no chest pain or tightness, very mild exertional  SOB, no resting SOB, no PND, no orthopnea, no LE edema, no palpitations, no syncope  GI:   no difficulty swallowing, no hematochezia, no hematemesis, no melena, no constipation, no diarrhea   GU:   no dysuria, no urgency, no frequency   Musculoskeletal: no arthritis, no arthralgia   Vascular:  no pain suggestive of claudication   Neuro:   no symptoms suggestive of TIA's, no seizures, no headaches, no peripheral neuropathy   Endocrine:  Negative   HEENT:  The patient just had dental extraction,  no recent vision changes  Psych:   no anxiety, no depression    Physical Exam:   BP 158/76  Pulse 68  Resp 20  Ht 5\' 8"  (1.727 m)  Wt 182 lb (82.555 kg)  BMI 27.67 kg/m2  SpO2 100%  LMP 06/17/2011  General:    well-appearing  HEENT:  Unremarkable   Neck:   no JVD, no bruits, no adenopathy   Chest:   clear to auscultation, symmetrical breath sounds, no wheezes, no rhonchi   CV:   RRR, soft grade II/VI systolic murmur   Abdomen:  soft, non-tender, no masses   Extremities:  warm, well-perfused, pulses non-palpable  Rectal/GU  Deferred  Neuro:   Grossly non-focal and symmetrical throughout  Skin:   Clean and dry, no rashes, no breakdown  Diagnostic Tests:  Left and right heart catheterization performed by Dr. Riley Kill is reviewed.  There is severe three-vessel coronary artery disease. There is 80% stenosis of the mid left anterior descending coronary artery after takeoff of the diagonal branch. There is 90% stenosis of the diagonal branch. There is 70% proximal stenosis of the left circumflex coronary artery with 75% stenosis of the large bifurcating obtuse marginal branch. There is right dominant Corti circulation. There is 80% stenosis of the mid right coronary artery. Left ventricular function is moderately reduced with ejection fraction estimated 40-45%. There is at least moderate mitral regurgitation. Pulmonary artery pressures measured 38/15 with pulmonary A wedge pressure 15 central venous  pressure 7.  Baseline cardiac output measured 5.7 L per minute corresponding to a cardiac index of 2.8.  Transesophageal echocardiogram performed by Dr. Tenny Craw 06/30/2011 is reviewed. This demonstrates the presence of severe mitral regurgitation. Functional anatomy is consistent with a combination of type I dysfunction and type IIIB dysfunction. Specifically, there is no evidence for mitral valve prolapse. There does appear to be some pure annular dilatation as well as some restriction of the posterior leaflet during systole. The jet of regurgitation is broad and central and courses slightly eccentrically posteriorly. There appears to be flow reversal in the pulmonary veins. There is mild tricuspid  regurgitation. No other significant abnormalities are noted.  Impression:  Severe three-vessel coronary artery disease with moderate left ventricular dysfunction and at least moderate to severe mitral regurgitation. The patient claims to be remarkably free of symptoms other than very mild exertional shortness of breath. She was hospitalized in September at which time her blood pressure was severely out-of-control. At that time she had some right sided abdominal pain which might been related to passive congestion of the liver and congestive heart failure. Symptoms seem to be well-controlled on medical therapy at this time. I agree that she would best be treated with elective surgical intervention to include coronary artery bypass grafting and mitral valve repair. Primary indications for surgery are to decrease his risk of future cardiac events and prolong the patient's long-term survival.  Plan:  I've discussed the indications, risks, and potential benefits of surgery at length with the patient here in the office today. Alternative treatment strategies been discussed including long-term prognosis with medical therapy. She desires to hold off on surgery until after the first of the year for family reasons. I  explained to her that there is probably a relatively small risk of acute myocardial infarction and/or exacerbation of congestive heart failure during the interval period of time. I've advised her to avoid strenuous physical activity and to keep in close touch with myself and Dr. Tenny Craw during the interim period of time. We will see her back in 8 weeks.    Salvatore Decent. Cornelius Moras, MD

## 2011-07-11 NOTE — Patient Instructions (Signed)
Avoid all strenuous physical activity and call your cardiologists office with any symptoms of chest discomfort or shortness of breath.

## 2011-07-12 ENCOUNTER — Telehealth: Payer: Self-pay | Admitting: *Deleted

## 2011-07-12 DIAGNOSIS — E782 Mixed hyperlipidemia: Secondary | ICD-10-CM

## 2011-07-12 NOTE — Telephone Encounter (Signed)
Dr.Ross had called the patient and she saw the surgeon.

## 2011-07-12 NOTE — Telephone Encounter (Signed)
LMOM about starting Zocor 20mg  with repeat labs in 8 weeks. Asked to please call back.

## 2011-07-13 NOTE — Telephone Encounter (Signed)
FU Call: Pt returning call to [facility] to inform nurse that pt is not on cholesterol medication. Pt was under the impression that Dr. Tenny Craw wanted pt to be on cholesterol medication. (Pt was made aware that Annice Pih was not if the office today). Please return pt call to discuss further.

## 2011-07-15 ENCOUNTER — Encounter (HOSPITAL_COMMUNITY): Payer: Self-pay | Admitting: Internal Medicine

## 2011-07-18 MED ORDER — SIMVASTATIN 20 MG PO TABS: 20.0000 mg | ORAL_TABLET | Freq: Every evening | ORAL | Status: DC

## 2011-07-18 NOTE — Telephone Encounter (Signed)
FU Call: Pt returning call to [facility]. Please return pt call to discuss further.

## 2011-07-18 NOTE — Telephone Encounter (Signed)
Called patient back. She will start Zocor 20mg  every day and have repeat fasting labs on 09/15/2011.

## 2011-07-21 ENCOUNTER — Telehealth: Payer: Self-pay | Admitting: Internal Medicine

## 2011-07-21 NOTE — Telephone Encounter (Signed)
E scribe did not work from Monday. [facility] for Simvastatin 20mg   #30 with 6 refills. Patient aware that this was called in to pharmacy.

## 2011-07-21 NOTE — Telephone Encounter (Signed)
Per pt call you where to call in a cholesterol med on Monday and it has not been called in so she could not start taking it please call into [facility] on [address].

## 2011-08-01 ENCOUNTER — Ambulatory Visit (HOSPITAL_COMMUNITY): Payer: Self-pay | Admitting: Dentistry

## 2011-08-01 ENCOUNTER — Encounter (HOSPITAL_COMMUNITY): Payer: Self-pay | Admitting: Dentistry

## 2011-08-01 VITALS — BP 129/62 | HR 67 | Temp 97.8°F

## 2011-08-01 DIAGNOSIS — K08109 Complete loss of teeth, unspecified cause, unspecified class: Secondary | ICD-10-CM

## 2011-08-01 DIAGNOSIS — K130 Diseases of lips: Secondary | ICD-10-CM

## 2011-08-01 DIAGNOSIS — K1370 Unspecified lesions of oral mucosa: Secondary | ICD-10-CM

## 2011-08-01 MED ORDER — NYSTATIN-TRIAMCINOLONE 100000-0.1 UNIT/GM-% EX OINT: TOPICAL_OINTMENT | Freq: Three times a day (TID) | CUTANEOUS | Status: DC

## 2011-08-01 NOTE — Progress Notes (Signed)
08/01/2011  Ashley [name] Date of birth: 10/16/1965 MRN:  [id number]  Past Medical History  Diagnosis Date  . Abdominal pain     ? chronic cholecystitis; admxn to [facility] 9/12 (CT, USN, HIDA done)  . Stroke     in setting of cigs and OCPs  . Cardiomyopathy     echo 9/12: mild LVH, EF 35-40%, mod MR (difficult to judge /post leaflet restricted), mod BAE, mod RVE, mod TR  . HTN (hypertension)   . Tobacco abuse   . Heart murmur   . Coronary artery disease   . Mitral regurgitation     No Known Allergies  Current Outpatient Prescriptions  Medication Sig Dispense Refill  . amLODipine (NORVASC) 10 MG tablet Take 1 tablet (10 mg total) by mouth daily.  30 tablet  11  . aspirin EC 81 MG tablet Take 1 tablet (81 mg total) by mouth daily.      . carvedilol (COREG) 12.5 MG tablet Take 1 tablet (12.5 mg total) by mouth 2 (two) times daily with a meal.  60 tablet  1  . famotidine (PEPCID) 20 MG tablet Take 20 mg by mouth as needed.        . hydrochlorothiazide (MICROZIDE) 12.5 MG capsule Take 1 capsule (12.5 mg total) by mouth daily.  30 capsule  1  . lisinopril (PRINIVIL,ZESTRIL) 20 MG tablet Take 1 tablet (20 mg total) by mouth 2 (two) times daily.  60 tablet  1  . nicotine (NICODERM CQ - DOSED IN MG/24 HOURS) 21 mg/24hr patch Place 1 patch onto the skin daily.        [name] nystatin-triamcinolone ointment (MYCOLOG) Apply topically 3 (three) times daily.  30 g  1  . oxyCODONE-acetaminophen (PERCOCET) 5-325 MG per tablet Take 1 tablet by mouth every 4 (four) hours as needed.        . simvastatin (ZOCOR) 20 MG tablet Take 1 tablet (20 mg total) by mouth every evening.  30 tablet  6       CC: Ashley [name] presents for evaluation of healing and to discuss fabrication of upper lower complete dentures  HPI: Patient was evaluated on 06/06/2011 for a pre-heart valve surgery dental protocol evaluation. Patient subsequently had all remaining teeth extracted with alveoloplasty and pre-prosthetic surgery  on 06/16/2011. Patient now presents for reevaluation of healing and to discuss fabrication of upper lower complete dentures.  General: Patient is a well-developed well-nourished female in no acute distress. Extra oral exam: There is no lymphadenopathy. The patient denies acute TMJ symptoms. 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 Intraoral exam: Patient is edentulous. Healing appears to have progressed well. Prosthodontics: We discussed fabrication of upper lower complete dentures. We discussed the process involved and fabricating upper lower complete denture as well as the prognosis for successful upper and lower complete denture fabrication. We discussed a quote for the process as well as Medicaid status. Patient currently wishes to wait to obtain dental Medicaid before proceeding with upper lower complete denture fabrication. The center  Assessment/plan: 1. Patient is to wait for Medicaid. We will then obtain prior approval before fabrication of upper and lower complete dentures. Patient is to contact dental medicine once she receives dental Medicaid. 2. Patient will most likely proceed with heart surgery in early 2013. Denture procedures will be adjusted according to medical status at that time. 3. Patient has angular cheilitis. Patient was provided a prescription for triamcinolone and nystatin ointment. Patient to apply the ointment to the corners of her mouth 3 times  daily. 30 g one refill. Patient dismissed in stable condition. Patient to call as needed if problems arise. Dr. Kristin Bruins

## 2011-08-13 ENCOUNTER — Other Ambulatory Visit: Payer: Self-pay | Admitting: Internal Medicine

## 2011-08-17 ENCOUNTER — Other Ambulatory Visit: Payer: Self-pay | Admitting: Internal Medicine

## 2011-08-17 MED ORDER — HYDROCHLOROTHIAZIDE 12.5 MG PO CAPS: 12.5000 mg | ORAL_CAPSULE | Freq: Every day | ORAL | Status: DC

## 2011-09-05 ENCOUNTER — Ambulatory Visit: Payer: Self-pay | Admitting: Thoracic Surgery (Cardiothoracic Vascular Surgery)

## 2011-09-15 ENCOUNTER — Other Ambulatory Visit (INDEPENDENT_AMBULATORY_CARE_PROVIDER_SITE_OTHER): Payer: Medicaid Other | Admitting: *Deleted

## 2011-09-15 DIAGNOSIS — E782 Mixed hyperlipidemia: Secondary | ICD-10-CM

## 2011-09-15 LAB — AST: AST: 20 U/L (ref 0–37)

## 2011-09-15 LAB — LIPID PANEL
LDL Cholesterol: 71 mg/dL (ref 0–99)
VLDL: 26.6 mg/dL (ref 0.0–40.0)

## 2011-09-19 ENCOUNTER — Encounter: Payer: Self-pay | Admitting: Thoracic Surgery (Cardiothoracic Vascular Surgery)

## 2011-09-19 ENCOUNTER — Ambulatory Visit (INDEPENDENT_AMBULATORY_CARE_PROVIDER_SITE_OTHER): Payer: Self-pay | Admitting: Thoracic Surgery (Cardiothoracic Vascular Surgery)

## 2011-09-19 ENCOUNTER — Other Ambulatory Visit: Payer: Self-pay

## 2011-09-19 ENCOUNTER — Other Ambulatory Visit: Payer: Self-pay | Admitting: Thoracic Surgery (Cardiothoracic Vascular Surgery)

## 2011-09-19 VITALS — BP 158/76 | HR 74 | Resp 18 | Ht 68.0 in | Wt 188.0 lb

## 2011-09-19 DIAGNOSIS — I359 Nonrheumatic aortic valve disorder, unspecified: Secondary | ICD-10-CM

## 2011-09-19 DIAGNOSIS — I2510 Atherosclerotic heart disease of native coronary artery without angina pectoris: Secondary | ICD-10-CM

## 2011-09-19 DIAGNOSIS — I340 Nonrheumatic mitral (valve) insufficiency: Secondary | ICD-10-CM

## 2011-09-19 DIAGNOSIS — I059 Rheumatic mitral valve disease, unspecified: Secondary | ICD-10-CM

## 2011-09-19 MED ORDER — AMIODARONE HCL 200 MG PO TABS: 200.0000 mg | ORAL_TABLET | Freq: Two times a day (BID) | ORAL | Status: DC

## 2011-09-19 NOTE — Progress Notes (Signed)
[address]            Ashley [name] [address]          [phone]     CARDIOTHORACIC SURGERY CONSULTATION REPORT  PCP is  Referring Provider is Dietrich Pates, MD  Chief Complaint  Patient presents with  . Routine Post Op    8 wk f/u for severe mitral regurgitation    HPI:  Patient returns for followup of coronary artery disease and mitral regurgitation. She was originally seen in consultation on 07/11/2011 and a full consultation report was generated at that time. The patient did not want to proceed with elective surgery until after the holidays. She now returns for followup and hopes to proceed with surgery in the near future. She reports that over the past 2 months she has remained clinically stable. She has been taking it easy physically, and as a result she has not had any problems at all. She does note that she gets tired and short of breath with ambulation but she limits herself and this does not bother her much at all. She's never had any chest discomfort. She denies any resting shortness of breath. She has not had PND, orthopnea, nor lower extremity edema. The remainder of her review of systems is unchanged from previously.  Past Medical History  Diagnosis Date  . Abdominal pain     ? chronic cholecystitis; admxn to [facility] 9/12 (CT, USN, HIDA done)  . Stroke     in setting of cigs and OCPs  . Cardiomyopathy     echo 9/12: mild LVH, EF 35-40%, mod MR (difficult to judge /post leaflet restricted), mod BAE, mod RVE, mod TR  . HTN (hypertension)   . Tobacco abuse   . Heart murmur   . Coronary artery disease   . Mitral regurgitation     Past Surgical History  Procedure Date  . Tonsillectomy   . Tubal ligation   . Cesarean section   . Cesarean section   . Tee without cardioversion 06/30/2011    Procedure: TRANSESOPHAGEAL ECHOCARDIOGRAM (TEE);  Surgeon: Pricilla Riffle, MD;  Location: [facility] ENDOSCOPY;  Service: Cardiovascular;  Laterality: N/A;     Family History  Problem Relation Age of Onset  . Stroke Neg Hx   . Hypertension Mother   . Heart disease Father   . COPD Father     Social History History  Substance Use Topics  . Smoking status: Former Smoker -- 1.0 packs/day for 30 years    Quit date: 05/07/2011  . Smokeless tobacco: Former Neurosurgeon    Quit date: 06/06/2011  . Alcohol Use: No    Current Outpatient Prescriptions  Medication Sig Dispense Refill  . amLODipine (NORVASC) 10 MG tablet Take 1 tablet (10 mg total) by mouth daily.  30 tablet  11  . aspirin EC 81 MG tablet Take 1 tablet (81 mg total) by mouth daily.      . carvedilol (COREG) 12.5 MG tablet take 1 capsule by mouth twice a day WITH A MEAL.  60 tablet  4  . famotidine (PEPCID) 20 MG tablet Take 20 mg by mouth as needed.        . hydrochlorothiazide (MICROZIDE) 12.5 MG capsule Take 1 capsule (12.5 mg total) by mouth daily.  30 capsule  4  . lisinopril (PRINIVIL,ZESTRIL) 20 MG tablet take 1 tablet by mouth twice a day  60 tablet  4  . nicotine (NICODERM CQ - DOSED IN MG/24 HOURS) 21 mg/24hr patch Place 1 patch onto the skin daily.        . simvastatin (ZOCOR) 20 MG tablet Take 1 tablet (20 mg total) by mouth every evening.  30 tablet  6  . amiodarone (PACERONE) 200 MG tablet Take 1 tablet (200 mg total) by mouth 2 (two) times daily. Begin 7 days prior to surgery.  14 tablet  0    No Known Allergies  Review of Systems:  General:  normal appetite, + fatigue  Respiratory:  no cough, no wheezing, no hemoptysis, no pain with inspiration or cough, no shortness of breath   Cardiac:   no chest pain or tightness, + exertional SOB, no resting SOB, no PND, no orthopnea, no LE edema, no palpitations, no syncope  GI:   no difficulty swallowing, no hematochezia, no hematemesis, no melena, no constipation, no diarrhea   GU:   no dysuria, no urgency, no frequency   Musculoskeletal: no arthritis, no arthralgia   Vascular:  no pain suggestive of claudication    Neuro:   no symptoms suggestive of TIA's, no seizures, no headaches, no peripheral neuropathy   Endocrine:  Negative   HEENT:  no loose teeth or painful teeth,  no recent vision changes  Psych:   no anxiety, no depression    Physical Exam:   BP 158/76  Pulse 74  Resp 18  Ht 5\' 8"  (1.727 m)  Wt 188 lb (85.276 kg)  BMI 28.59 kg/m2  SpO2 99%  General:    well-appearing  HEENT:  Unremarkable   Neck:   no JVD, no bruits, no adenopathy   Chest:   clear to auscultation, symmetrical breath sounds, no wheezes, no rhonchi   CV:   RRR, no murmur   Abdomen:  soft, non-tender, no masses   Extremities:  warm, well-perfused, pulses not palpable  Rectal/GU  Deferred  Neuro:   Grossly non-focal and symmetrical throughout  Skin:   Clean and dry, no rashes, no breakdown   Diagnostic Tests:  n/a   Impression:  Severe three-vessel coronary artery disease with moderate left ventricular dysfunction and at least moderate to severe mitral regurgitation. The patient claims to be remarkably free of symptoms other than very mild exertional shortness of breath. She was hospitalized in September at which time her blood pressure was severely out-of-control.  Symptoms remain well-controlled on medical therapy at this time. I agree that she would best be treated with elective surgical intervention to include coronary artery bypass grafting and mitral valve repair. Primary indications for surgery are to decrease his risk of future cardiac events and prolong the patient's long-term survival.   Plan:  The rationale for elective mitral valve repair and coronary artery bypass surgery has been explained, including a comparison between surgery and continued medical therapy with close follow-up.  The likelihood of successful and durable valve repair has been discussed with particular reference to the findings of their previous echocardiogram.  Based upon these findings and previous experience, I have quoted them a  greater than 90 percent likelihood of successful valve repair.  In the unlikely event that their valve cannot be successfully repaired, we discussed the possibility of replacing the mitral valve using a mechanical prosthesis with the attendant need for long-term anticoagulation versus the alternative of replacing it using a bioprosthetic tissue valve with its potential for late structural valve deterioration and failure, depending upon the patient's longevity.  The  patient specifically requests that if the mitral valve must be replaced that it be done using a mechanical valve.  The patient understands and accepts all potential associated risks of surgery including but not limited to risk of death, stroke, myocardial infarction, congestive heart failure, respiratory failure, renal failure, bleeding requiring blood transfusion and/or reexploration, arrhythmia, heart block or bradycardia requiring permanent pacemaker, pneumonia, pleural effusion, wound infection, pulmonary embolus or other thromboembolic complication, chronic pain or other delayed complications.  All questions answered.  We plan to proceed with surgery on Wednesday, February 20. I have given the patient a prescription for amiodarone to begin one week prior to surgery to decrease risk of atrial arrhythmias. The patient and her husband will return to see me here in the office on Monday, February 18 for final followup and to answer any possible questions that they might have prior to surgery.    Salvatore Decent. Cornelius Moras, MD 09/19/2011 4:12 PM

## 2011-09-28 ENCOUNTER — Telehealth: Payer: Self-pay | Admitting: Internal Medicine

## 2011-09-28 NOTE — Telephone Encounter (Signed)
New Problem   Patient is scheduled to have surgery 10/12/11, but has sinus infection and low grade fever.  Please return call to patient to discuss, she can be reached at [phone]

## 2011-09-28 NOTE — Telephone Encounter (Signed)
Complaining of a low grade temp and symptoms of a sinus infection.  Requesting antibiotics without being seen.  She states she does not have a pcp, only sees Dr Tenny Craw.  I recommended that she go to urgent care today to be seen re: sinus problems as antibiotics are generally not prescribed over the phone.

## 2011-10-03 ENCOUNTER — Encounter (HOSPITAL_COMMUNITY): Payer: Self-pay | Admitting: Pharmacy Technician

## 2011-10-07 ENCOUNTER — Telehealth: Payer: Self-pay

## 2011-10-07 NOTE — Telephone Encounter (Signed)
Ashley [name], who is scheduled for MV Repair/CABG 10/12/11, called the clinic to report that she was being treated for bronchitis/sinus infection and was taking amoxicillin, since 09/30/11.  She wanted Dr. Cornelius Moras to be aware in the event her surgery would need to be postponed.  The patient reports that she is feeling better, still has a non-productive cough (also improving) and no fever. She would like to keep the surgery as scheduled.  She is follow up with Dr. Cornelius Moras 10/10/11, who will make a final determination.

## 2011-10-10 ENCOUNTER — Inpatient Hospital Stay (HOSPITAL_COMMUNITY)
Admission: RE | Admit: 2011-10-10 | Discharge: 2011-10-10 | Disposition: A | Discharge status: Home / Self Care | Payer: Self-pay | Source: Ambulatory Visit | Attending: Thoracic Surgery (Cardiothoracic Vascular Surgery) | Admitting: Thoracic Surgery (Cardiothoracic Vascular Surgery)

## 2011-10-10 ENCOUNTER — Ambulatory Visit: Payer: Self-pay | Admitting: Thoracic Surgery (Cardiothoracic Vascular Surgery)

## 2011-10-10 ENCOUNTER — Ambulatory Visit (INDEPENDENT_AMBULATORY_CARE_PROVIDER_SITE_OTHER): Payer: Self-pay | Admitting: Thoracic Surgery (Cardiothoracic Vascular Surgery)

## 2011-10-10 ENCOUNTER — Ambulatory Visit (HOSPITAL_COMMUNITY)
Admission: RE | Admit: 2011-10-10 | Discharge: 2011-10-10 | Disposition: A | Discharge status: Home / Self Care | Payer: Medicaid Other | Source: Ambulatory Visit | Attending: Thoracic Surgery (Cardiothoracic Vascular Surgery) | Admitting: Thoracic Surgery (Cardiothoracic Vascular Surgery)

## 2011-10-10 ENCOUNTER — Other Ambulatory Visit: Payer: Self-pay

## 2011-10-10 ENCOUNTER — Other Ambulatory Visit (HOSPITAL_COMMUNITY): Payer: Self-pay | Admitting: Radiology

## 2011-10-10 ENCOUNTER — Encounter (HOSPITAL_COMMUNITY)
Admission: RE | Admit: 2011-10-10 | Discharge: 2011-10-10 | Disposition: A | Discharge status: Home / Self Care | Payer: Medicaid Other | Source: Ambulatory Visit | Attending: Thoracic Surgery (Cardiothoracic Vascular Surgery) | Admitting: Thoracic Surgery (Cardiothoracic Vascular Surgery)

## 2011-10-10 ENCOUNTER — Encounter: Payer: Self-pay | Admitting: Thoracic Surgery (Cardiothoracic Vascular Surgery)

## 2011-10-10 ENCOUNTER — Encounter (HOSPITAL_COMMUNITY): Payer: Self-pay

## 2011-10-10 VITALS — BP 150/84 | HR 80 | Temp 98.2°F | Resp 20 | Ht 68.0 in | Wt 183.0 lb

## 2011-10-10 DIAGNOSIS — I2510 Atherosclerotic heart disease of native coronary artery without angina pectoris: Secondary | ICD-10-CM

## 2011-10-10 DIAGNOSIS — I340 Nonrheumatic mitral (valve) insufficiency: Secondary | ICD-10-CM

## 2011-10-10 DIAGNOSIS — Z01818 Encounter for other preprocedural examination: Secondary | ICD-10-CM | POA: Insufficient documentation

## 2011-10-10 DIAGNOSIS — I059 Rheumatic mitral valve disease, unspecified: Secondary | ICD-10-CM

## 2011-10-10 DIAGNOSIS — R0609 Other forms of dyspnea: Secondary | ICD-10-CM | POA: Insufficient documentation

## 2011-10-10 DIAGNOSIS — Z01810 Encounter for preprocedural cardiovascular examination: Secondary | ICD-10-CM

## 2011-10-10 DIAGNOSIS — F17200 Nicotine dependence, unspecified, uncomplicated: Secondary | ICD-10-CM | POA: Insufficient documentation

## 2011-10-10 DIAGNOSIS — Z01812 Encounter for preprocedural laboratory examination: Secondary | ICD-10-CM | POA: Insufficient documentation

## 2011-10-10 DIAGNOSIS — R0989 Other specified symptoms and signs involving the circulatory and respiratory systems: Secondary | ICD-10-CM | POA: Insufficient documentation

## 2011-10-10 HISTORY — DX: Acute upper respiratory infection, unspecified: J06.9

## 2011-10-10 LAB — CBC
MCH: 34.8 pg — ABNORMAL HIGH (ref 26.0–34.0)
MCV: 97.3 fL (ref 78.0–100.0)
Platelets: 324 10*3/uL (ref 150–400)
RDW: 12.4 % (ref 11.5–15.5)
WBC: 8.1 10*3/uL (ref 4.0–10.5)

## 2011-10-10 LAB — URINALYSIS, ROUTINE W REFLEX MICROSCOPIC
Bilirubin Urine: NEGATIVE
Hgb urine dipstick: NEGATIVE
Protein, ur: NEGATIVE mg/dL
Specific Gravity, Urine: 1.011 (ref 1.005–1.030)
Urobilinogen, UA: 0.2 mg/dL (ref 0.0–1.0)

## 2011-10-10 LAB — BLOOD GAS, ARTERIAL
Acid-base deficit: 1.4 mmol/L (ref 0.0–2.0)
Drawn by: 206361
FIO2: 0.21 %
O2 Saturation: 98.9 %
pCO2 arterial: 34.7 mmHg — ABNORMAL LOW (ref 35.0–45.0)

## 2011-10-10 LAB — APTT: aPTT: 31 seconds (ref 24–37)

## 2011-10-10 LAB — COMPREHENSIVE METABOLIC PANEL
AST: 16 U/L (ref 0–37)
Albumin: 3.9 g/dL (ref 3.5–5.2)
Calcium: 9.6 mg/dL (ref 8.4–10.5)
Creatinine, Ser: 1.78 mg/dL — ABNORMAL HIGH (ref 0.50–1.10)

## 2011-10-10 LAB — SURGICAL PCR SCREEN: MRSA, PCR: NEGATIVE

## 2011-10-10 LAB — HCG, SERUM, QUALITATIVE: Preg, Serum: NEGATIVE

## 2011-10-10 LAB — HEMOGLOBIN A1C: Mean Plasma Glucose: 103 mg/dL (ref <117)

## 2011-10-10 LAB — PULMONARY FUNCTION TEST

## 2011-10-10 LAB — PROTIME-INR: Prothrombin Time: 13.5 seconds (ref 11.6–15.2)

## 2011-10-10 MED ORDER — ALBUTEROL SULFATE (5 MG/ML) 0.5% IN NEBU
2.5000 mg | INHALATION_SOLUTION | Freq: Once | RESPIRATORY_TRACT | Status: AC
  Administered 2011-10-10: 2.5 mg via RESPIRATORY_TRACT

## 2011-10-10 MED ORDER — AMIODARONE HCL 200 MG PO TABS: 200.0000 mg | ORAL_TABLET | Freq: Two times a day (BID) | ORAL | Status: DC

## 2011-10-10 NOTE — Progress Notes (Signed)
Pre-op Cardiac Surgery  Carotid Findings:  Bilaterally no significant ICA stenosis with antegrade vertebral flow. Right ECA stenosis.  Upper Extremity Right Left  Brachial Pressures 149 152  Radial Waveforms Tri Tri  Ulnar Waveforms Tri Tri  Palmar Arch (Allen's Test) Waveform remains normal with radial compression and decreases >50% with ulnar compression Waveform remains normal with radial compression and decreases >50% with ulnar compression   Ashley [name], RDMS

## 2011-10-10 NOTE — Pre-Procedure Instructions (Signed)
20 Ashley [name]  10/10/2011   Your procedure is scheduled on:  FEB 20  Report to [facility] at 0630 AM.  Call this number if you have problems the morning of surgery: [phone]   Remember:   Do not eat food:After Midnight.  May have clear liquids: up to 4 Hours before arrival.0230  Clear liquids include soda, tea, black coffee, apple or grape juice, broth.  Take these medicines the morning of surgery with A SIP OF WATER: COREG,PEPCID,AMLODIPINE,AMIODARONE   Do not wear jewelry, make-up or nail polish.  Do not wear lotions, powders, or perfumes. You may wear deodorant.  Do not shave 48 hours prior to surgery.  Do not bring valuables to the hospital.  Contacts, dentures or bridgework may not be worn into surgery.  Leave suitcase in the car. After surgery it may be brought to your room.  For patients admitted to the hospital, checkout time is 11:00 AM the day of discharge.   Patients discharged the day of surgery will not be allowed to drive home.  Name and phone number of your driver: HUSBAND  Special Instructions: CHG Shower Use Special Wash: 1/2 bottle night before surgery and 1/2 bottle morning of surgery.   Please read over the following fact sheets that you were given: Coughing and Deep Breathing, Blood Transfusion Information, Lab Information, Open Heart Packet, MRSA Information and Surgical Site Infection Prevention

## 2011-10-10 NOTE — Progress Notes (Signed)
[address]            Ashley [name] [address]          [phone]     CARDIOTHORACIC SURGERY OFFICE NOTE  Referring Provider is Pricilla Riffle, MD PCP is Ashley Pates, MD, MD   HPI:  Patient returns for follow-up of mitral regurgitation and coronary artery disease with tentative plans for elective mitral valve repair and coronary artery bypass grafting this week.  She reports remaining stable until 10 days ago when she developed an upper respiratory tract infection associated with sinus congestion and cough.  She went to an urgent care center where she was told that she had bronchitis and she was given a prescription for amoxicillin, which she has been taking up until today.  She states that her cough has improved and she feels better, but she still has sinus congestion and drainage.  She denies chest pain, fever, shortness of breath.   Current Outpatient Prescriptions  Medication Sig Dispense Refill  . amiodarone (PACERONE) 200 MG tablet Take 1 tablet (200 mg total) by mouth 2 (two) times daily. Begin 7 days prior to surgery.  14 tablet  0  . amLODipine (NORVASC) 10 MG tablet Take 1 tablet (10 mg total) by mouth daily.  30 tablet  11  . aspirin EC 81 MG tablet Take 1 tablet (81 mg total) by mouth daily.      . benzonatate (TESSALON) 100 MG capsule Take 100-200 mg by mouth 3 (three) times daily as needed. For cough      . carvedilol (COREG) 12.5 MG tablet take 1 capsule by mouth twice a day WITH A MEAL.  60 tablet  4  . famotidine (PEPCID) 20 MG tablet Take 20 mg by mouth as needed. For indigestion      . hydrochlorothiazide (MICROZIDE) 12.5 MG capsule Take 1 capsule (12.5 mg total) by mouth daily.  30 capsule  4  . lisinopril (PRINIVIL,ZESTRIL) 20 MG tablet take 1 tablet by mouth twice a day  60 tablet  4  . nicotine (NICODERM CQ - DOSED IN MG/24 HOURS) 21 mg/24hr patch Place 1 patch onto the skin daily.        . Phenazopyridine HCl (AZO TABS PO) Take 400 mg  by mouth 3 (three) times daily. Azo yeast      . simvastatin (ZOCOR) 20 MG tablet Take 1 tablet (20 mg total) by mouth every evening.  30 tablet  6   No current facility-administered medications for this visit.   Facility-Administered Medications Ordered in Other Visits  Medication Dose Route Frequency Provider Last Rate Last Dose  . albuterol (PROVENTIL) (5 MG/ML) 0.5% nebulizer solution 2.5 mg  2.5 mg Nebulization Once Ashley Nails, MD   2.5 mg at 10/10/11 1210      Physical Exam:   BP 150/84  Pulse 80  Temp(Src) 98.2 F (36.8 C) (Oral)  Resp 20  Ht 5\' 8"  (1.727 m)  Wt 83.008 kg (183 lb)  BMI 27.82 kg/m2  SpO2 99%  LMP 10/07/2011  General:  Well appearing  Chest:   Few rhonchi  CV:   RRR with murmur  Abdomen:  soft  Extremities:  warm  Diagnostic Tests:  Results for SHUTTLEAmisadai, [name] (MRN [id number]) as of 10/10/2011 16:43  Ref. Range 10/10/2011 13:41  WBC Latest Range: 4.0-10.5 K/uL 8.1  RBC Latest Range: 3.87-5.11 MIL/uL 3.36 (  L)  HGB Latest Range: 12.0-15.0 g/dL 16.1 (L)  HCT Latest Range: 36.0-46.0 % 32.7 (L)  MCV Latest Range: 78.0-100.0 fL 97.3  MCH Latest Range: 26.0-34.0 pg 34.8 (H)  MCHC Latest Range: 30.0-36.0 g/dL 09.6  RDW Latest Range: 11.5-15.5 % 12.4  Platelets Latest Range: 150-400 K/uL 324   CHEST - 2 VIEW  Comparison: 05/15/2011  Findings: Cardiac enlargement has improved. Heart size is now  upper normal. Negative for heart failure. Lungs are clear without  infiltrate or effusion. Negative for mass lesion.  IMPRESSION:  Interval improvement in cardiac enlargement. No acute  cardiopulmonary disease.  Original Report Authenticated By: Camelia [name], M.D.   Impression:  Ashley [name] appears to be recovering from an upper respiratory tract infection.  She remains stable from a cardiovascular standpoint.  Plan:  We will postpone her surgery 1 week and tentatively reschedule for Thursday 10/20/2011.  I will see her back in the office next Monday  to make certain that she has continued to recover uneventfully.   Ashley Decent. Cornelius Moras, MD 10/10/2011 4:39 PM

## 2011-10-11 NOTE — Consult Note (Addendum)
Anesthesia:  Patient is a 46 year old female scheduled for a MV Repair and CABG on 10/20/11.  Other history includes HTN, former smoker, CVA, CM, and recent treatment for bronchitis.  Her Cardiologist is Dr. Tenny Craw.  2D echo on 05/16/11 showed moderately reduced LV systolic function, EF 35-40%, restricted movement in MV posterior leaflet, mod MR/TR, bi-atrium and RV moderately dilated.  TEE on 06/30/11 showed: - Mitral valve: Severe MR directed posteriorly to back of Left atrium. Backflow noted at orifice of L pulmonary veins. - Left atrium: No evidence of thrombus in the atrial cavity or appendage. - Atrial septum: No defect or patent foramen ovale was Identified.  Cardiac cath on 06/07/11 showed (See Notes Tab): 1. Improved left ventricular overall systolic function with probable at least moderate mitral regurgitation.  2. Mild pulmonary hypertension secondary to improved left ventricular overall systolic function with probable at least moderate mitral regurgitation.  3. Scattered three-vessel coronary artery disease.  EKG noted.  CXR showed interval improvement in cardiac enlargement. No acute cardiopulmonary disease.   Labs reviewed.  Her Cr is up to 1.78 from 1.00 in October 2012.  H/H 11.7/32.7.  TCTS is now closed, but I will follow up with Dr. Cornelius Moras or his staff re: plans to repeat her BMET pre-operatively.  Addendum:  10/14/11 1450  Patient has a PAT appointment to repeat labs on 10/17/11.

## 2011-10-14 ENCOUNTER — Ambulatory Visit: Payer: Self-pay | Admitting: Thoracic Surgery (Cardiothoracic Vascular Surgery)

## 2011-10-17 ENCOUNTER — Encounter: Payer: Self-pay | Admitting: Thoracic Surgery (Cardiothoracic Vascular Surgery)

## 2011-10-17 ENCOUNTER — Encounter (HOSPITAL_COMMUNITY)
Admission: RE | Admit: 2011-10-17 | Discharge: 2011-10-17 | Disposition: A | Discharge status: Home / Self Care | Payer: Medicaid Other | Source: Ambulatory Visit | Attending: Thoracic Surgery (Cardiothoracic Vascular Surgery) | Admitting: Thoracic Surgery (Cardiothoracic Vascular Surgery)

## 2011-10-17 ENCOUNTER — Ambulatory Visit (INDEPENDENT_AMBULATORY_CARE_PROVIDER_SITE_OTHER): Payer: Self-pay | Admitting: Thoracic Surgery (Cardiothoracic Vascular Surgery)

## 2011-10-17 VITALS — BP 133/70 | HR 66 | Resp 20 | Ht 68.0 in | Wt 183.0 lb

## 2011-10-17 DIAGNOSIS — I340 Nonrheumatic mitral (valve) insufficiency: Secondary | ICD-10-CM

## 2011-10-17 DIAGNOSIS — I2510 Atherosclerotic heart disease of native coronary artery without angina pectoris: Secondary | ICD-10-CM

## 2011-10-17 DIAGNOSIS — I059 Rheumatic mitral valve disease, unspecified: Secondary | ICD-10-CM

## 2011-10-17 LAB — CBC
MCH: 34.8 pg — ABNORMAL HIGH (ref 26.0–34.0)
MCV: 98.8 fL (ref 78.0–100.0)
Platelets: 324 10*3/uL (ref 150–400)
RDW: 12.3 % (ref 11.5–15.5)
WBC: 8.6 10*3/uL (ref 4.0–10.5)

## 2011-10-17 LAB — TYPE AND SCREEN
ABO/RH(D): A POS
Antibody Screen: NEGATIVE

## 2011-10-17 LAB — COMPREHENSIVE METABOLIC PANEL
AST: 17 U/L (ref 0–37)
Albumin: 3.8 g/dL (ref 3.5–5.2)
Calcium: 9.7 mg/dL (ref 8.4–10.5)
Creatinine, Ser: 1.61 mg/dL — ABNORMAL HIGH (ref 0.50–1.10)

## 2011-10-17 NOTE — Progress Notes (Signed)
                   [address]            [name] [address]          [phone]     CARDIOTHORACIC SURGERY OFFICE NOTE  Referring Provider is Pricilla Riffle, MD PCP is Dietrich Pates, MD, MD   HPI:  Patient returns for follow-up of coronary artery disease and mitral regurgitation. She had previously been scheduled for elective surgery last week, her surgery was postponed because of the development of an upper respiratory tract infection. She returns for office today for followup. She states that her cough has completely resolved. Congestion is greatly improved. She's not having any fevers. She denies shortness of breath. She is eager to proceed with surgery later this week.   Current Outpatient Prescriptions  Medication Sig Dispense Refill  . amiodarone (PACERONE) 200 MG tablet Take 1 tablet (200 mg total) by mouth 2 (two) times daily. Begin 7 days prior to surgery.  14 tablet  0  . amLODipine (NORVASC) 10 MG tablet Take 1 tablet (10 mg total) by mouth daily.  30 tablet  11  . aspirin EC 81 MG tablet Take 1 tablet (81 mg total) by mouth daily.      . carvedilol (COREG) 12.5 MG tablet take 1 capsule by mouth twice a day WITH A MEAL.  60 tablet  4  . famotidine (PEPCID) 20 MG tablet Take 20 mg by mouth as needed. For indigestion      . hydrochlorothiazide (MICROZIDE) 12.5 MG capsule Take 1 capsule (12.5 mg total) by mouth daily.  30 capsule  4  . lisinopril (PRINIVIL,ZESTRIL) 20 MG tablet take 1 tablet by mouth twice a day  60 tablet  4  . nicotine (NICODERM CQ - DOSED IN MG/24 HOURS) 21 mg/24hr patch Place 1 patch onto the skin daily.        . simvastatin (ZOCOR) 20 MG tablet Take 1 tablet (20 mg total) by mouth every evening.  30 tablet  6      Physical Exam:   BP 133/70  Pulse 66  Resp 20  Ht 5\' 8"  (1.727 m)  Wt 183 lb (83.008 kg)  BMI 27.82 kg/m2  SpO2 100%  LMP 10/07/2011  General:  Well-appearing  Chest:   Clear to auscultation  CV:   Regular rate and  rhythm  Abdomen:  Soft and nontender  Extremities:  Warm and well-perfused  Diagnostic Tests:  n/a   Impression:  Javonne's upper respiratory tract infection appears to have completely resolved. I think it is reasonable to proceed with surgery this week.  Plan:  We plan coronary artery bypass grafting and mitral valve repair on Thursday, February 28. All questions have been answered.   Salvatore Decent. Cornelius Moras, MD 10/17/2011 9:31 AM

## 2011-10-17 NOTE — Progress Notes (Signed)
SPOKE WITH DR Noreene Larsson RE PATIENT SURGERY BEING RESCHED DUE TO BRONCHITIS, SHE STATES SHE IS BETTER AND FINISHED ANTIBIOTIC. CXR WAS CLEAR ON 10/10/11 , WE DO NOT NEED TO REPEAT PER DR JOSLIN.

## 2011-10-17 NOTE — H&P (Signed)
CARDIOTHORACIC SURGERY HISTORY AND PHYSICAL EXAM  PCP is Dietrich Pates, MD, MD Referring Provider is Dietrich Pates, MD   Reason for consultation:  Mitral regurgitation  HPI:  Patient is a 46 year old married white female from [place] with history of heart murmur discovered 20 years ago when the patient suffered a stroke at age 22. She was told that she had mitral valve prolapse at that time. She otherwise has no cardiac history and had not seen a physician in many years. She was hospitalized in September 2012 because of right-sided abdominal discomfort. At that time there was some question as to whether or not she might have cholecystitis, but a high scan was negative. Gallbladder ultrasound revealed thickened gallbladder wall. The patient was seen in consultation by Dr. Renee Harder from the general surgery service at that time. Her symptoms resolved and were thought perhaps to be related to passive congestion of the liver. She was also noted to have severe mitral regurgitation with moderate left ventricular dysfunction. She had severe hypertension that was poorly controlled at the time of her initial hospital presentation. Since then she has been followed carefully by Dr. Tenny Craw. She ultimately underwent left and right heart catheterization by Dr. Riley Kill 06/07/2011. This revealed severe three-vessel coronary artery disease with moderate left ventricular dysfunction and at least moderate mitral regurgitation. The patient has been seen back since then in followup by Dr. Tenny Craw and underwent transesophageal echocardiogram 06/30/2011. This revealed severe mitral regurgitation. The patient was referred for possible elective surgical intervention and originally seen in consultation last November.  She desired to hold off on surgery until after the first of the year and now presents for elective surgical intervention.    Past Medical History  Diagnosis Date  . Abdominal pain     ? chronic  cholecystitis; admxn to [facility] 9/12 (CT, USN, HIDA done)  . Stroke     in setting of cigs and OCPs  . Cardiomyopathy     echo 9/12: mild LVH, EF 35-40%, mod MR (difficult to judge /post leaflet restricted), mod BAE, mod RVE, mod TR  . HTN (hypertension)   . Tobacco abuse   . Heart murmur   . Coronary artery disease   . Mitral regurgitation   . Recurrent upper respiratory infection (URI)     COMPLETED 10 DAY COARSE  ABX .COMPLETED 10/09/11    Past Surgical History  Procedure Date  . Tonsillectomy   . Tubal ligation   . Cesarean section   . Cesarean section   . Tee without cardioversion 06/30/2011    Procedure: TRANSESOPHAGEAL ECHOCARDIOGRAM (TEE);  Surgeon: Pricilla Riffle, MD;  Location: [facility] ENDOSCOPY;  Service: Cardiovascular;  Laterality: N/A;    Family History  Problem Relation Age of Onset  . Stroke Neg Hx   . Hypertension Mother   . Heart disease Father   . COPD Father     Social History History  Substance Use Topics  . Smoking status: Former Smoker -- 1.0 packs/day for 30 years    Quit date: 05/07/2011  . Smokeless tobacco: Former Neurosurgeon    Quit date: 06/06/2011  . Alcohol Use: No    No current facility-administered medications for this encounter.   Current Outpatient Prescriptions  Medication Sig Dispense Refill  . amLODipine (NORVASC) 10 MG tablet Take 1 tablet (10 mg total) by mouth daily.  30 tablet  11  . aspirin EC 81 MG tablet Take 1 tablet (81 mg total) by mouth  daily.      . carvedilol (COREG) 12.5 MG tablet take 1 capsule by mouth twice a day WITH A MEAL.  60 tablet  4  . famotidine (PEPCID) 20 MG tablet Take 20 mg by mouth as needed. For indigestion      . hydrochlorothiazide (MICROZIDE) 12.5 MG capsule Take 1 capsule (12.5 mg total) by mouth daily.  30 capsule  4  . lisinopril (PRINIVIL,ZESTRIL) 20 MG tablet take 1 tablet by mouth twice a day  60 tablet  4  . nicotine (NICODERM CQ - DOSED IN MG/24 HOURS) 21 mg/24hr patch Place 1 patch onto the skin daily.         . simvastatin (ZOCOR) 20 MG tablet Take 1 tablet (20 mg total) by mouth every evening.  30 tablet  6  . amiodarone (PACERONE) 200 MG tablet Take 1 tablet (200 mg total) by mouth 2 (two) times daily. Begin 7 days prior to surgery.  14 tablet  0    Allergies  Allergen Reactions  . Azithromycin Nausea Only    Review of Systems:             General:                      normal appetite, normal energy               Respiratory:                recent cough has resolved, no wheezing, no hemoptysis, no pain with inspiration or cough, very mild shortness of breath with activity             Cardiac:                       no chest pain or tightness, very mild exertional SOB, no resting SOB, no PND, no orthopnea, no LE edema, no palpitations, no syncope             GI:                                no difficulty swallowing, no hematochezia, no hematemesis, no melena, no constipation, no diarrhea               GU:                              no dysuria, no urgency, no frequency               Musculoskeletal:         no arthritis, no arthralgia               Vascular:                     no pain suggestive of claudication               Neuro:                         no symptoms suggestive of TIA's, no seizures, no headaches, no peripheral neuropathy               Endocrine:                   Negative  HEENT:                       The patient had dental extraction,  no recent vision changes             Psych:                         no anxiety, no depression                Physical Exam:              BP 158/76  Pulse 68  Resp 20  Ht 5\' 8"  (1.727 m)  Wt 182 lb (82.555 kg)  BMI 27.67 kg/m2  SpO2 100%  LMP 06/17/2011             General:                        well-appearing             HEENT:                       Unremarkable               Neck:                           no JVD, no bruits, no adenopathy               Chest:                         clear to auscultation,  symmetrical breath sounds, no wheezes, no rhonchi               CV:                              RRR, soft grade II/VI systolic murmur               Abdomen:                    soft, non-tender, no masses               Extremities:                 warm, well-perfused, pulses non-palpable             Rectal/GU                   Deferred             Neuro:                         Grossly non-focal and symmetrical throughout             Skin:                            Clean and dry, no rashes, no breakdown    Diagnostic Tests:  DATE OF PROCEDURE:  06/07/2011  CARDIAC CATHETERIZATION    HEMODYNAMIC DATA: 1. Right atrial pressure 7. 2. RV 39/7. 3. Pulmonary 38/15, mean 24. 4. Pulmonary capillary wedge 15. 5. Aortic 149/69, mean 94. 6. LV 134/12. 7. No mitral or aortic gradient. 8.  Fick cardiac output 5.51 L/minute. 9. Fick cardiac index 2.73 L/minute/m2. 10.Thermodilution cardiac output 5.7 L/minute. 11.Thermodilution cardiac index 2.8 L/minute/m2. 12.Superior vena cava saturation 67%. 13.Pulmonary artery saturation 69%. 14.Aortic saturation 93%.   ANGIOGRAPHIC DATA: 1. Ventriculography done in the RAO projection reveals what appears to     be improved overall left ventricular function.  Estimated ejection     fraction at this point in time would be in the 45-50% range.  There     is at least moderate mitral regurgitation noted. 2. On plain fluoroscopy, there is generalized calcification of the     coronary vessels. 3. The left main is without critical disease. 4. The left anterior descending artery courses to the apex.  There is     a tiny first diagonal branch with tandem 90% lesions.  Basically,     it is subtotally occluded in its midportion.  The LAD beyond this     is heavily calcified but opens up into a septal and diagonal.  The     second diagonal has probably 40% plaquing proximally, and then an     80% segmental lesion at the origin of the side branch.  The  distal     vessel is modest in size, could be potentially grafted.  The LAD     proper has approximately 70-80% focal stenosis more distally to the     second diagonal takeoff, and the distal vessel has mild luminal     irregularities and wraps the apical tip.  It would be a suitable     vessel for grafting. 5. The circumflex is a fairly large caliber vessel.  There is ostial     narrowing of about 70% which is smooth.  There is a mid stenosis     into the large bifurcating marginal about 75% which is eccentric. 6. The right coronary artery is segmentally diseased in the proximal     mid junction with 70-80% narrowing.  Distally, there is luminal     irregularity but no critical disease with a large posterior     descending and posterolateral segment.   CONCLUSIONS: 1. Improved left ventricular overall systolic function with probable     at least moderate mitral regurgitation. 2. Mild pulmonary hypertension secondary to improved left ventricular     overall systolic function with probable at least moderate mitral     regurgitation. 3. Scattered three-vessel coronary artery disease as described in the     above text.  Left and right heart catheterization performed by Dr. Riley Kill is reviewed.  There is severe three-vessel coronary artery disease. There is 80% stenosis of the mid left anterior descending coronary artery after takeoff of the diagonal branch. There is 90% stenosis of the diagonal branch. There is 70% proximal stenosis of the left circumflex coronary artery with 75% stenosis of the large bifurcating obtuse marginal branch. There is right dominant Corti circulation. There is 80% stenosis of the mid right coronary artery. Left ventricular function is moderately reduced with ejection fraction estimated 40-45%. There is at least moderate mitral regurgitation. Pulmonary artery pressures measured 38/15 with pulmonary A wedge pressure 15 central venous pressure 7.  Baseline cardiac output  measured 5.7 L per minute corresponding to a cardiac index of 2.8.     Transesophageal Echocardiography  Patient:    Ashley [name], Ashley [name] MR #:       [id number] Study Date: 06/30/2011 ------------------------------------------------------------ Study Conclusions  - Mitral valve: Severe MR directed posteriorly to back of  Left atrium. Backflow noted at orifice of L pulmonary   veins. - Left atrium: No evidence of thrombus in the atrial cavity   or appendage. - Atrial septum: No defect or patent foramen ovale was   identified. ----------------------------------------------------- Left ventricle:  Normal LV systolic function. ------------------------------------------------------------ Aortic valve:   Trileaflet; mildly thickened leaflets. Doppler:   No regurgitation. ------------------------------------------------------------ Aorta:  Mild fixed plaque in thoracic aorta. ------------------------------------------------------------ Mitral valve:  Severe MR directed posteriorly to back of Left atrium. Backflow noted at orifice of L pulmonary veins. ------------------------------------------------------------ Left atrium:   No evidence of thrombus in the atrial cavity or appendage. ------------------------------------------------------------ Atrial septum:  No defect or patent foramen ovale was identified. ------------------------------------------------------------ Right ventricle:  Normal RV systolic function. ------------------------------------------------------------ Tricuspid valve:  TV is normal. ------------------------------------------------------------ ------------------------------------------------------------ Prepared and Electronically Authenticated by  Dietrich Pates, MD [id number]:[id number]  Transesophageal echocardiogram performed by Dr. Tenny Craw 06/30/2011 is reviewed. This demonstrates the presence of severe mitral regurgitation. Functional anatomy is consistent with  a combination of type I dysfunction and type IIIB dysfunction. Specifically, there is no evidence for mitral valve prolapse. There does appear to be some pure annular dilatation as well as some restriction of the posterior leaflet during systole. The jet of regurgitation is broad and central and courses slightly eccentrically posteriorly. There appears to be flow reversal in the pulmonary veins. There is mild tricuspid regurgitation. No other significant abnormalities are noted.   Impression:  Severe three-vessel coronary artery disease with ischemic mitral regurgitation.  Plan:  The rationale for elective coronary artery bypass grafting and mitral valve repair surgery has been explained, including a comparison between surgery and continued medical therapy with close follow-up.  The likelihood of successful and durable valve repair has been discussed with particular reference to the findings of their recent echocardiogram.  Based upon these findings and previous experience, I have quoted them a greater than 90 percent likelihood of successful valve repair.  In the unlikely event that their valve cannot be successfully repaired, we discussed the possibility of replacing the mitral valve using a mechanical prosthesis with the attendant need for long-term anticoagulation versus the alternative of replacing it using a bioprosthetic tissue valve with its potential for late structural valve deterioration and failure, depending upon the patient's longevity.  The patient specifically requests that if the mitral valve must be replaced that it be done using a mechanical valve.  The patient understands and accepts all potential associated risks of surgery including but not limited to risk of death, stroke, myocardial infarction, congestive heart failure, respiratory failure, renal failure, bleeding requiring blood transfusion and/or reexploration, arrhythmia, heart block or bradycardia requiring permanent pacemaker,  pneumonia, pleural effusion, wound infection, pulmonary embolus or other thromboembolic complication, chronic pain or other delayed complications.  All questions answered.     Salvatore Decent. Ashley Moras, MD

## 2011-10-17 NOTE — Progress Notes (Signed)
PER DR JOSLIN LABS DONE 10/10/11 ARE OKAY UNLESS ABNORMAL.

## 2011-10-17 NOTE — Progress Notes (Signed)
Patient states she was not told to stop  baby aspirin, and Dee at office stated she should continue aspirin if dr Cornelius Moras did not instruct to stop.

## 2011-10-17 NOTE — Pre-Procedure Instructions (Signed)
20 Kolly[address] Nardone  10/17/2011   Your procedure is scheduled on: 10/20/11 Thursday     Report to [facility] Short Stay Center at 530 AM.  Call this number if you have problems the morning of surgery: [phone]   Remember:   Do not eat food:After Midnight.  May have clear liquids: up to 4 Hours before arrival.  Clear liquids include soda, tea, black coffee, apple or grape juice, broth.  Take these medicines the morning of surgery with A SIP OF WATER:  AMIODARONE(PACERONE), NORVASC (AMLODIPINE), COREG (CARVEDILOL), PEPCID, STOP ASPIRIN, NSAIDS    Do not wear jewelry, make-up or nail polish.  Do not wear lotions, powders, or perfumes. You may wear deodorant.  Do not shave 48 hours prior to surgery.  Do not bring valuables to the hospital.  Contacts, dentures or bridgework may not be worn into surgery.  Leave suitcase in the car. After surgery it may be brought to your room.  For patients admitted to the hospital, checkout time is 11:00 AM the day of discharge.   Patients discharged the day of surgery will not be allowed to drive home.  Name and phone number of your driver:   Special Instructions: CHG Shower Use Special Wash: 1/2 bottle night before surgery and 1/2 bottle morning of surgery.   Please read over the following fact sheets that you were given: Pain Booklet, Open Heart Packet, MRSA Information and Surgical Site Infection Prevention

## 2011-10-17 NOTE — Progress Notes (Signed)
SPOKE WITH DEE WHO STATED PATIENT DID NOT NEED TO REPEAT PFTS.

## 2011-10-19 MED ORDER — VERAPAMIL HCL 2.5 MG/ML IV SOLN
INTRAVENOUS | Status: DC
  Filled 2011-10-19: qty 2.5

## 2011-10-19 MED ORDER — POTASSIUM CHLORIDE 2 MEQ/ML IV SOLN
80.0000 meq | INTRAVENOUS | Status: DC
  Filled 2011-10-19: qty 40

## 2011-10-19 MED ORDER — EPINEPHRINE HCL 1 MG/ML IJ SOLN
0.5000 ug/min | INTRAVENOUS | Status: DC
  Filled 2011-10-19: qty 4

## 2011-10-19 MED ORDER — NITROGLYCERIN IN D5W 200-5 MCG/ML-% IV SOLN
2.0000 ug/min | INTRAVENOUS | Status: AC
  Administered 2011-10-21: 5 ug/min via INTRAVENOUS
  Filled 2011-10-19: qty 250

## 2011-10-19 MED ORDER — MAGNESIUM SULFATE 50 % IJ SOLN
40.0000 meq | INTRAMUSCULAR | Status: DC
  Filled 2011-10-19: qty 10

## 2011-10-19 MED ORDER — DEXMEDETOMIDINE HCL 100 MCG/ML IV SOLN
0.1000 ug/kg/h | INTRAVENOUS | Status: AC
  Administered 2011-10-21: .2 ug/kg/h via INTRAVENOUS
  Filled 2011-10-19: qty 4

## 2011-10-19 MED ORDER — METOPROLOL TARTRATE 12.5 MG HALF TABLET: 12.5000 mg | ORAL_TABLET | Freq: Once | ORAL | Status: DC

## 2011-10-19 MED ORDER — DEXTROSE 5 % IV SOLN
750.0000 mg | INTRAVENOUS | Status: AC
  Administered 2011-10-21: .75 g via INTRAVENOUS
  Filled 2011-10-19: qty 750

## 2011-10-19 MED ORDER — VANCOMYCIN HCL 1000 MG IV SOLR
1250.0000 mg | INTRAVENOUS | Status: AC
  Administered 2011-10-21: 1250 mg via INTRAVENOUS
  Filled 2011-10-19: qty 1250

## 2011-10-19 MED ORDER — PHENYLEPHRINE HCL 10 MG/ML IJ SOLN
30.0000 ug/min | INTRAVENOUS | Status: AC
  Administered 2011-10-21: 10 ug/min via INTRAVENOUS
  Filled 2011-10-19: qty 2

## 2011-10-19 MED ORDER — SODIUM CHLORIDE 0.9 % IV SOLN
INTRAVENOUS | Status: AC
  Administered 2011-10-21: 69.88 mL/h via INTRAVENOUS
  Filled 2011-10-19: qty 40

## 2011-10-19 MED ORDER — DEXTROSE 5 % IV SOLN
1.5000 g | INTRAVENOUS | Status: AC
  Administered 2011-10-21: 1.5 g via INTRAVENOUS
  Filled 2011-10-19: qty 1.5

## 2011-10-19 MED ORDER — CHLORHEXIDINE GLUCONATE 4 % EX LIQD
30.0000 mL | CUTANEOUS | Status: DC
  Filled 2011-10-19: qty 30

## 2011-10-19 MED ORDER — SODIUM CHLORIDE 0.9 % IV SOLN
INTRAVENOUS | Status: AC
  Administered 2011-10-21: 1.4 [IU]/h via INTRAVENOUS
  Filled 2011-10-19: qty 1

## 2011-10-19 MED ORDER — DOPAMINE-DEXTROSE 3.2-5 MG/ML-% IV SOLN
2.0000 ug/kg/min | INTRAVENOUS | Status: DC
  Filled 2011-10-19: qty 250

## 2011-10-20 NOTE — Progress Notes (Signed)
Notified Pt. To be here at short stay tomorrow at 0530.

## 2011-10-21 ENCOUNTER — Inpatient Hospital Stay (HOSPITAL_COMMUNITY)
Admission: RE | Admit: 2011-10-21 | Discharge: 2011-10-29 | DRG: 219 | Disposition: A | Discharge status: Home / Self Care | Payer: Medicaid Other | Source: Ambulatory Visit | Attending: Thoracic Surgery (Cardiothoracic Vascular Surgery) | Admitting: Thoracic Surgery (Cardiothoracic Vascular Surgery)

## 2011-10-21 ENCOUNTER — Inpatient Hospital Stay (HOSPITAL_COMMUNITY): Payer: Medicaid Other

## 2011-10-21 ENCOUNTER — Encounter (HOSPITAL_COMMUNITY)
Admission: RE | Disposition: A | Discharge status: Home / Self Care | Payer: Self-pay | Source: Ambulatory Visit | Attending: Thoracic Surgery (Cardiothoracic Vascular Surgery)

## 2011-10-21 ENCOUNTER — Ambulatory Visit (HOSPITAL_COMMUNITY): Payer: Medicaid Other | Admitting: Vascular Surgery

## 2011-10-21 ENCOUNTER — Encounter (HOSPITAL_COMMUNITY): Payer: Self-pay | Admitting: Vascular Surgery

## 2011-10-21 ENCOUNTER — Encounter (HOSPITAL_COMMUNITY): Payer: Self-pay

## 2011-10-21 DIAGNOSIS — T17308A Unspecified foreign body in larynx causing other injury, initial encounter: Secondary | ICD-10-CM | POA: Diagnosis not present

## 2011-10-21 DIAGNOSIS — I428 Other cardiomyopathies: Secondary | ICD-10-CM | POA: Diagnosis present

## 2011-10-21 DIAGNOSIS — E782 Mixed hyperlipidemia: Secondary | ICD-10-CM

## 2011-10-21 DIAGNOSIS — Z951 Presence of aortocoronary bypass graft: Secondary | ICD-10-CM

## 2011-10-21 DIAGNOSIS — I10 Essential (primary) hypertension: Secondary | ICD-10-CM | POA: Diagnosis present

## 2011-10-21 DIAGNOSIS — J189 Pneumonia, unspecified organism: Secondary | ICD-10-CM | POA: Diagnosis not present

## 2011-10-21 DIAGNOSIS — I2510 Atherosclerotic heart disease of native coronary artery without angina pectoris: Secondary | ICD-10-CM

## 2011-10-21 DIAGNOSIS — I059 Rheumatic mitral valve disease, unspecified: Principal | ICD-10-CM

## 2011-10-21 DIAGNOSIS — Z01812 Encounter for preprocedural laboratory examination: Secondary | ICD-10-CM

## 2011-10-21 DIAGNOSIS — J9819 Other pulmonary collapse: Secondary | ICD-10-CM | POA: Diagnosis not present

## 2011-10-21 DIAGNOSIS — J40 Bronchitis, not specified as acute or chronic: Secondary | ICD-10-CM | POA: Diagnosis present

## 2011-10-21 DIAGNOSIS — Z836 Family history of other diseases of the respiratory system: Secondary | ICD-10-CM

## 2011-10-21 DIAGNOSIS — Z8249 Family history of ischemic heart disease and other diseases of the circulatory system: Secondary | ICD-10-CM

## 2011-10-21 DIAGNOSIS — Z883 Allergy status to other anti-infective agents status: Secondary | ICD-10-CM

## 2011-10-21 DIAGNOSIS — K219 Gastro-esophageal reflux disease without esophagitis: Secondary | ICD-10-CM | POA: Diagnosis present

## 2011-10-21 DIAGNOSIS — E785 Hyperlipidemia, unspecified: Secondary | ICD-10-CM | POA: Diagnosis present

## 2011-10-21 DIAGNOSIS — Z87891 Personal history of nicotine dependence: Secondary | ICD-10-CM

## 2011-10-21 DIAGNOSIS — Z8673 Personal history of transient ischemic attack (TIA), and cerebral infarction without residual deficits: Secondary | ICD-10-CM

## 2011-10-21 DIAGNOSIS — Z9889 Other specified postprocedural states: Secondary | ICD-10-CM

## 2011-10-21 DIAGNOSIS — J988 Other specified respiratory disorders: Secondary | ICD-10-CM | POA: Diagnosis not present

## 2011-10-21 DIAGNOSIS — D62 Acute posthemorrhagic anemia: Secondary | ICD-10-CM | POA: Diagnosis not present

## 2011-10-21 HISTORY — PX: MITRAL VALVE REPAIR: SHX2039

## 2011-10-21 HISTORY — DX: Presence of aortocoronary bypass graft: Z95.1

## 2011-10-21 HISTORY — DX: Other specified postprocedural states: Z98.890

## 2011-10-21 HISTORY — PX: CORONARY ARTERY BYPASS GRAFT: SHX141

## 2011-10-21 LAB — POCT I-STAT 3, ART BLOOD GAS (G3+)
Acid-base deficit: 4 mmol/L — ABNORMAL HIGH (ref 0.0–2.0)
Acid-base deficit: 4 mmol/L — ABNORMAL HIGH (ref 0.0–2.0)
Bicarbonate: 21.5 mEq/L (ref 20.0–24.0)
Bicarbonate: 19.8 mEq/L — ABNORMAL LOW (ref 20.0–24.0)
O2 Saturation: 100 %
O2 Saturation: 87 %
Patient temperature: 97.6
Patient temperature: 97.7
TCO2: 23 mmol/L (ref 0–100)
pCO2 arterial: 34 mmHg — ABNORMAL LOW (ref 35.0–45.0)
pH, Arterial: 7.297 — ABNORMAL LOW (ref 7.350–7.400)
pH, Arterial: 7.326 — ABNORMAL LOW (ref 7.350–7.400)
pO2, Arterial: 75 mmHg — ABNORMAL LOW (ref 80.0–100.0)
pO2, Arterial: 53 mmHg — ABNORMAL LOW (ref 80.0–100.0)

## 2011-10-21 LAB — POCT I-STAT, CHEM 8
Creatinine, Ser: 1.5 mg/dL — ABNORMAL HIGH (ref 0.50–1.10)
Glucose, Bld: 140 mg/dL — ABNORMAL HIGH (ref 70–99)
Hemoglobin: 8.8 g/dL — ABNORMAL LOW (ref 12.0–15.0)
Sodium: 135 mEq/L (ref 135–145)
TCO2: 21 mmol/L (ref 0–100)

## 2011-10-21 LAB — CBC
MCH: 34.4 pg — ABNORMAL HIGH (ref 26.0–34.0)
MCV: 97.7 fL (ref 78.0–100.0)
MCV: 96 fL (ref 78.0–100.0)
Platelets: 171 10*3/uL (ref 150–400)
Platelets: 192 10*3/uL (ref 150–400)
RBC: 2.56 MIL/uL — ABNORMAL LOW (ref 3.87–5.11)
RDW: 13.3 % (ref 11.5–15.5)
RDW: 13.2 % (ref 11.5–15.5)
WBC: 12.2 10*3/uL — ABNORMAL HIGH (ref 4.0–10.5)
WBC: 14.1 10*3/uL — ABNORMAL HIGH (ref 4.0–10.5)

## 2011-10-21 LAB — POCT I-STAT 4, (NA,K, GLUC, HGB,HCT)
Glucose, Bld: 107 mg/dL — ABNORMAL HIGH (ref 70–99)
Glucose, Bld: 114 mg/dL — ABNORMAL HIGH (ref 70–99)
Glucose, Bld: 128 mg/dL — ABNORMAL HIGH (ref 70–99)
HCT: 28 % — ABNORMAL LOW (ref 36.0–46.0)
HCT: 19 % — ABNORMAL LOW (ref 36.0–46.0)
HCT: 23 % — ABNORMAL LOW (ref 36.0–46.0)
Hemoglobin: 7.8 g/dL — ABNORMAL LOW (ref 12.0–15.0)
Hemoglobin: 7.5 g/dL — ABNORMAL LOW (ref 12.0–15.0)
Hemoglobin: 8.5 g/dL — ABNORMAL LOW (ref 12.0–15.0)
Potassium: 4.1 mEq/L (ref 3.5–5.1)
Potassium: 6 mEq/L — ABNORMAL HIGH (ref 3.5–5.1)
Potassium: 5.4 mEq/L — ABNORMAL HIGH (ref 3.5–5.1)
Potassium: 4.3 mEq/L (ref 3.5–5.1)
Sodium: 128 mEq/L — ABNORMAL LOW (ref 135–145)
Sodium: 134 mEq/L — ABNORMAL LOW (ref 135–145)
Sodium: 132 mEq/L — ABNORMAL LOW (ref 135–145)

## 2011-10-21 LAB — PROTIME-INR: Prothrombin Time: 17.5 seconds — ABNORMAL HIGH (ref 11.6–15.2)

## 2011-10-21 LAB — PLATELET COUNT: Platelets: 146 10*3/uL — ABNORMAL LOW (ref 150–400)

## 2011-10-21 LAB — GLUCOSE, CAPILLARY
Glucose-Capillary: 124 mg/dL — ABNORMAL HIGH (ref 70–99)
Glucose-Capillary: 97 mg/dL (ref 70–99)
Glucose-Capillary: 153 mg/dL — ABNORMAL HIGH (ref 70–99)

## 2011-10-21 LAB — HEMOGLOBIN AND HEMATOCRIT, BLOOD: Hemoglobin: 7.9 g/dL — ABNORMAL LOW (ref 12.0–15.0)

## 2011-10-21 LAB — APTT: aPTT: 39 seconds — ABNORMAL HIGH (ref 24–37)

## 2011-10-21 LAB — CREATININE, SERUM: GFR calc non Af Amer: 47 mL/min — ABNORMAL LOW (ref >90)

## 2011-10-21 LAB — MAGNESIUM: Magnesium: 3.5 mg/dL — ABNORMAL HIGH (ref 1.5–2.5)

## 2011-10-21 SURGERY — REPAIR, MITRAL VALVE
Anesthesia: General | Site: Chest | Wound class: Clean

## 2011-10-21 MED ORDER — METOPROLOL TARTRATE 12.5 MG HALF TABLET: 12.5000 mg | ORAL_TABLET | Freq: Two times a day (BID) | ORAL | Status: DC

## 2011-10-21 MED ORDER — SIMVASTATIN 20 MG PO TABS
20.0000 mg | ORAL_TABLET | Freq: Every evening | ORAL | Status: DC
  Administered 2011-10-22 – 2011-10-28 (×7): 20 mg via ORAL
  Filled 2011-10-21 (×9): qty 1

## 2011-10-21 MED ORDER — BISACODYL 5 MG PO TBEC: 10.0000 mg | DELAYED_RELEASE_TABLET | Freq: Every day | ORAL | Status: DC

## 2011-10-21 MED ORDER — BISACODYL 10 MG RE SUPP: 10.0000 mg | Freq: Every day | RECTAL | Status: DC

## 2011-10-21 MED ORDER — DOPAMINE-DEXTROSE 3.2-5 MG/ML-% IV SOLN
2.0000 ug/kg/min | INTRAVENOUS | Status: AC
  Filled 2011-10-21: qty 250

## 2011-10-21 MED ORDER — ASPIRIN EC 325 MG PO TBEC: 325.0000 mg | DELAYED_RELEASE_TABLET | Freq: Every day | ORAL | Status: DC

## 2011-10-21 MED ORDER — 0.9 % SODIUM CHLORIDE (POUR BTL) OPTIME
TOPICAL | Status: DC | PRN
  Administered 2011-10-21: 6000 mL

## 2011-10-21 MED ORDER — HEMOSTATIC AGENTS (NO CHARGE) OPTIME
TOPICAL | Status: DC | PRN
  Administered 2011-10-21: 3 via TOPICAL

## 2011-10-21 MED ORDER — ALBUMIN HUMAN 5 % IV SOLN: 250.0000 mL | INTRAVENOUS | Status: DC | PRN

## 2011-10-21 MED ORDER — ACETAMINOPHEN 160 MG/5ML PO SOLN: 650.0000 mg | ORAL | Status: DC

## 2011-10-21 MED ORDER — ASPIRIN EC 325 MG PO TBEC
325.0000 mg | DELAYED_RELEASE_TABLET | Freq: Every day | ORAL | Status: DC
  Administered 2011-10-22 – 2011-10-25 (×4): 325 mg via ORAL
  Filled 2011-10-21 (×5): qty 1

## 2011-10-21 MED ORDER — HEPARIN SODIUM (PORCINE) 1000 UNIT/ML IJ SOLN
INTRAMUSCULAR | Status: DC | PRN
  Administered 2011-10-21: 2000 [IU] via INTRAVENOUS
  Administered 2011-10-21: 23000 [IU] via INTRAVENOUS

## 2011-10-21 MED ORDER — MAGNESIUM SULFATE 40 MG/ML IJ SOLN: 4.0000 g | Freq: Once | INTRAMUSCULAR | Status: DC

## 2011-10-21 MED ORDER — NITROGLYCERIN IN D5W 200-5 MCG/ML-% IV SOLN: 0.0000 ug/min | INTRAVENOUS | Status: DC

## 2011-10-21 MED ORDER — ROCURONIUM BROMIDE 100 MG/10ML IV SOLN
INTRAVENOUS | Status: DC | PRN
  Administered 2011-10-21: 50 mg via INTRAVENOUS

## 2011-10-21 MED ORDER — INSULIN REGULAR BOLUS VIA INFUSION: 0.0000 [IU] | Freq: Three times a day (TID) | INTRAVENOUS | Status: DC

## 2011-10-21 MED ORDER — DEXTROSE 5 % IV SOLN
750.0000 mg | INTRAVENOUS | Status: AC
  Filled 2011-10-21: qty 750

## 2011-10-21 MED ORDER — SUFENTANIL CITRATE 50 MCG/ML IV SOLN
INTRAVENOUS | Status: DC | PRN
  Administered 2011-10-21: 20 ug via INTRAVENOUS
  Administered 2011-10-21 (×2): 10 ug via INTRAVENOUS
  Administered 2011-10-21: 40 ug via INTRAVENOUS
  Administered 2011-10-21: 30 ug via INTRAVENOUS

## 2011-10-21 MED ORDER — INSULIN REGULAR HUMAN 100 UNIT/ML IJ SOLN: INTRAMUSCULAR | Status: DC

## 2011-10-21 MED ORDER — LACTATED RINGERS IV SOLN
INTRAVENOUS | Status: DC | PRN
  Administered 2011-10-21 (×4): via INTRAVENOUS

## 2011-10-21 MED ORDER — ACETAMINOPHEN 160 MG/5ML PO SOLN: 975.0000 mg | Freq: Four times a day (QID) | ORAL | Status: DC

## 2011-10-21 MED ORDER — VERAPAMIL HCL 2.5 MG/ML IV SOLN
INTRAVENOUS | Status: AC
  Filled 2011-10-21: qty 2.5

## 2011-10-21 MED ORDER — VANCOMYCIN HCL 1000 MG IV SOLR
1250.0000 mg | INTRAVENOUS | Status: AC
  Filled 2011-10-21: qty 1250

## 2011-10-21 MED ORDER — SODIUM CHLORIDE 0.45 % IV SOLN
INTRAVENOUS | Status: DC
  Administered 2011-10-23: 01:00:00 via INTRAVENOUS

## 2011-10-21 MED ORDER — DEXTROSE 5 % IV SOLN: 1.5000 g | Freq: Two times a day (BID) | INTRAVENOUS | Status: DC

## 2011-10-21 MED ORDER — MIDAZOLAM HCL 5 MG/5ML IJ SOLN
INTRAMUSCULAR | Status: DC | PRN
  Administered 2011-10-21: 5 mg via INTRAVENOUS
  Administered 2011-10-21: 2 mg via INTRAVENOUS
  Administered 2011-10-21 (×3): 1 mg via INTRAVENOUS
  Administered 2011-10-21 (×2): 2 mg via INTRAVENOUS

## 2011-10-21 MED ORDER — ACETAMINOPHEN 650 MG RE SUPP: 650.0000 mg | RECTAL | Status: DC

## 2011-10-21 MED ORDER — SODIUM CHLORIDE 0.9 % IJ SOLN: 3.0000 mL | INTRAMUSCULAR | Status: DC | PRN

## 2011-10-21 MED ORDER — BISACODYL 5 MG PO TBEC
10.0000 mg | DELAYED_RELEASE_TABLET | Freq: Every day | ORAL | Status: DC
  Administered 2011-10-22 – 2011-10-29 (×8): 10 mg via ORAL
  Filled 2011-10-21 (×9): qty 2

## 2011-10-21 MED ORDER — SODIUM CHLORIDE 0.9 % IV SOLN
0.4000 ug/kg/h | INTRAVENOUS | Status: DC
  Filled 2011-10-21: qty 4

## 2011-10-21 MED ORDER — FAMOTIDINE IN NACL 20-0.9 MG/50ML-% IV SOLN: 20.0000 mg | Freq: Two times a day (BID) | INTRAVENOUS | Status: DC

## 2011-10-21 MED ORDER — BENZOCAINE 20 % MT SOLN
1.0000 "application " | OROMUCOSAL | Status: DC | PRN
  Filled 2011-10-21: qty 57

## 2011-10-21 MED ORDER — VANCOMYCIN HCL 1000 MG IV SOLR: 1000.0000 mg | Freq: Once | INTRAVENOUS | Status: DC

## 2011-10-21 MED ORDER — ONDANSETRON HCL 4 MG/2ML IJ SOLN
4.0000 mg | Freq: Four times a day (QID) | INTRAMUSCULAR | Status: DC | PRN
  Administered 2011-10-22: 4 mg via INTRAVENOUS
  Filled 2011-10-21: qty 2

## 2011-10-21 MED ORDER — ONDANSETRON HCL 4 MG/2ML IJ SOLN: 4.0000 mg | Freq: Four times a day (QID) | INTRAMUSCULAR | Status: DC | PRN

## 2011-10-21 MED ORDER — OXYCODONE HCL 5 MG PO TABS: 5.0000 mg | ORAL_TABLET | ORAL | Status: DC | PRN

## 2011-10-21 MED ORDER — METOPROLOL TARTRATE 25 MG/10 ML ORAL SUSPENSION
12.5000 mg | Freq: Two times a day (BID) | ORAL | Status: DC
  Filled 2011-10-21 (×7): qty 5

## 2011-10-21 MED ORDER — PANTOPRAZOLE SODIUM 40 MG PO TBEC: 40.0000 mg | DELAYED_RELEASE_TABLET | Freq: Every day | ORAL | Status: DC

## 2011-10-21 MED ORDER — PHENYLEPHRINE HCL 10 MG/ML IJ SOLN: 0.0000 ug/min | INTRAVENOUS | Status: DC

## 2011-10-21 MED ORDER — SODIUM BICARBONATE 8.4 % IV SOLN
50.0000 meq | Freq: Once | INTRAVENOUS | Status: DC
  Filled 2011-10-21 (×3): qty 50

## 2011-10-21 MED ORDER — VERAPAMIL HCL 2.5 MG/ML IV SOLN
INTRAVENOUS | Status: DC | PRN
  Administered 2011-10-21: 13:00:00

## 2011-10-21 MED ORDER — METOPROLOL TARTRATE 1 MG/ML IV SOLN: 2.5000 mg | INTRAVENOUS | Status: DC | PRN

## 2011-10-21 MED ORDER — SODIUM CHLORIDE 0.9 % IV SOLN: INTRAVENOUS | Status: DC

## 2011-10-21 MED ORDER — ACETAMINOPHEN 500 MG PO TABS
1000.0000 mg | ORAL_TABLET | Freq: Four times a day (QID) | ORAL | Status: AC
  Administered 2011-10-21 – 2011-10-26 (×18): 1000 mg via ORAL
  Filled 2011-10-21 (×16): qty 2
  Filled 2011-10-21: qty 1
  Filled 2011-10-21 (×3): qty 2

## 2011-10-21 MED ORDER — PHENYLEPHRINE HCL 10 MG/ML IJ SOLN: 30.0000 ug/min | INTRAVENOUS | Status: DC

## 2011-10-21 MED ORDER — SODIUM CHLORIDE 0.45 % IV SOLN: INTRAVENOUS | Status: DC

## 2011-10-21 MED ORDER — SODIUM CHLORIDE 0.9 % IV SOLN
0.1000 ug/kg/h | INTRAVENOUS | Status: DC
  Filled 2011-10-21: qty 2

## 2011-10-21 MED ORDER — ASPIRIN 81 MG PO CHEW: 324.0000 mg | CHEWABLE_TABLET | Freq: Every day | ORAL | Status: DC

## 2011-10-21 MED ORDER — LACTATED RINGERS IV SOLN
500.0000 mL | Freq: Once | INTRAVENOUS | Status: AC | PRN
  Administered 2011-10-21: 500 mL via INTRAVENOUS

## 2011-10-21 MED ORDER — SODIUM CHLORIDE 0.9 % IV SOLN: 250.0000 mL | INTRAVENOUS | Status: DC

## 2011-10-21 MED ORDER — SODIUM CHLORIDE 0.9 % IV SOLN
INTRAVENOUS | Status: AC
  Filled 2011-10-21: qty 1

## 2011-10-21 MED ORDER — LACTATED RINGERS IV SOLN: INTRAVENOUS | Status: DC

## 2011-10-21 MED ORDER — DEXTROSE 5 % IV SOLN
1.5000 g | INTRAVENOUS | Status: AC
  Filled 2011-10-21: qty 1.5

## 2011-10-21 MED ORDER — DOCUSATE SODIUM 100 MG PO CAPS
200.0000 mg | ORAL_CAPSULE | Freq: Every day | ORAL | Status: DC
  Administered 2011-10-22 – 2011-10-29 (×8): 200 mg via ORAL
  Filled 2011-10-21 (×8): qty 2

## 2011-10-21 MED ORDER — INSULIN REGULAR BOLUS VIA INFUSION
0.0000 [IU] | Freq: Three times a day (TID) | INTRAVENOUS | Status: DC
  Administered 2011-10-21: 0.4 [IU] via INTRAVENOUS
  Administered 2011-10-25: 0 [IU] via INTRAVENOUS
  Filled 2011-10-21: qty 10

## 2011-10-21 MED ORDER — HEMOSTATIC AGENTS (NO CHARGE) OPTIME
TOPICAL | Status: DC | PRN
  Administered 2011-10-21: 1 via TOPICAL

## 2011-10-21 MED ORDER — METOPROLOL TARTRATE 12.5 MG HALF TABLET
12.5000 mg | ORAL_TABLET | Freq: Two times a day (BID) | ORAL | Status: DC
  Administered 2011-10-21 – 2011-10-25 (×7): 12.5 mg via ORAL
  Filled 2011-10-21 (×11): qty 1

## 2011-10-21 MED ORDER — SODIUM CHLORIDE 0.9 % IV SOLN
1.0000 g/h | Freq: Once | INTRAVENOUS | Status: DC
  Filled 2011-10-21: qty 20

## 2011-10-21 MED ORDER — CALCIUM CHLORIDE 10 % IV SOLN
1.0000 g | Freq: Once | INTRAVENOUS | Status: AC | PRN
  Filled 2011-10-21: qty 10

## 2011-10-21 MED ORDER — ACETAMINOPHEN 500 MG PO TABS: 1000.0000 mg | ORAL_TABLET | Freq: Four times a day (QID) | ORAL | Status: DC

## 2011-10-21 MED ORDER — FAMOTIDINE IN NACL 20-0.9 MG/50ML-% IV SOLN
20.0000 mg | Freq: Two times a day (BID) | INTRAVENOUS | Status: AC
  Administered 2011-10-21: 20 mg via INTRAVENOUS

## 2011-10-21 MED ORDER — EPINEPHRINE HCL 1 MG/ML IJ SOLN
0.5000 ug/min | INTRAVENOUS | Status: AC
  Filled 2011-10-21: qty 4

## 2011-10-21 MED ORDER — MAGNESIUM SULFATE 50 % IJ SOLN
40.0000 meq | INTRAMUSCULAR | Status: AC
  Filled 2011-10-21: qty 10

## 2011-10-21 MED ORDER — PROPOFOL 10 MG/ML IV EMUL
INTRAVENOUS | Status: DC | PRN
  Administered 2011-10-21: 80 mg via INTRAVENOUS

## 2011-10-21 MED ORDER — MIDAZOLAM HCL 2 MG/2ML IJ SOLN: 2.0000 mg | INTRAMUSCULAR | Status: DC | PRN

## 2011-10-21 MED ORDER — SODIUM BICARBONATE 8.4 % IV SOLN
INTRAVENOUS | Status: AC
  Filled 2011-10-21: qty 50

## 2011-10-21 MED ORDER — SODIUM CHLORIDE 0.9 % IJ SOLN
3.0000 mL | Freq: Two times a day (BID) | INTRAMUSCULAR | Status: DC
  Administered 2011-10-22 – 2011-10-25 (×7): 3 mL via INTRAVENOUS

## 2011-10-21 MED ORDER — MORPHINE SULFATE 2 MG/ML IJ SOLN
1.0000 mg | INTRAMUSCULAR | Status: AC | PRN
  Administered 2011-10-21: 2 mg via INTRAVENOUS
  Administered 2011-10-21: 4 mg via INTRAVENOUS
  Administered 2011-10-21 – 2011-10-22 (×2): 2 mg via INTRAVENOUS
  Filled 2011-10-21: qty 2
  Filled 2011-10-21 (×2): qty 1
  Filled 2011-10-21: qty 2

## 2011-10-21 MED ORDER — MAGNESIUM SULFATE 40 MG/ML IJ SOLN
4.0000 g | Freq: Once | INTRAMUSCULAR | Status: AC
  Administered 2011-10-21: 4 g via INTRAVENOUS
  Filled 2011-10-21: qty 100

## 2011-10-21 MED ORDER — PROTAMINE SULFATE 10 MG/ML IV SOLN
INTRAVENOUS | Status: DC | PRN
  Administered 2011-10-21: 250 mg via INTRAVENOUS

## 2011-10-21 MED ORDER — NITROGLYCERIN IN D5W 200-5 MCG/ML-% IV SOLN
2.0000 ug/min | INTRAVENOUS | Status: AC
  Filled 2011-10-21: qty 250

## 2011-10-21 MED ORDER — METOPROLOL TARTRATE 25 MG/10 ML ORAL SUSPENSION: 12.5000 mg | Freq: Two times a day (BID) | ORAL | Status: DC

## 2011-10-21 MED ORDER — DEXTROSE 5 % IV SOLN
1.5000 g | Freq: Two times a day (BID) | INTRAVENOUS | Status: AC
  Administered 2011-10-21 – 2011-10-22 (×3): 1.5 g via INTRAVENOUS
  Filled 2011-10-21 (×4): qty 1.5

## 2011-10-21 MED ORDER — ALBUMIN HUMAN 5 % IV SOLN
INTRAVENOUS | Status: DC | PRN
  Administered 2011-10-21 (×2): via INTRAVENOUS

## 2011-10-21 MED ORDER — DEXMEDETOMIDINE HCL 100 MCG/ML IV SOLN: 0.1000 ug/kg/h | INTRAVENOUS | Status: DC

## 2011-10-21 MED ORDER — PANTOPRAZOLE SODIUM 40 MG PO TBEC
40.0000 mg | DELAYED_RELEASE_TABLET | Freq: Every day | ORAL | Status: DC
  Administered 2011-10-23 – 2011-10-29 (×7): 40 mg via ORAL
  Filled 2011-10-21 (×7): qty 1

## 2011-10-21 MED ORDER — NICOTINE 21 MG/24HR TD PT24
21.0000 mg | MEDICATED_PATCH | TRANSDERMAL | Status: DC
  Administered 2011-10-22 – 2011-10-24 (×3): 21 mg via TRANSDERMAL
  Filled 2011-10-21 (×6): qty 1

## 2011-10-21 MED ORDER — POTASSIUM CHLORIDE 10 MEQ/50ML IV SOLN: 10.0000 meq | INTRAVENOUS | Status: DC

## 2011-10-21 MED ORDER — SODIUM CHLORIDE 0.9 % IV SOLN
INTRAVENOUS | Status: DC
  Filled 2011-10-21: qty 1

## 2011-10-21 MED ORDER — VECURONIUM BROMIDE 10 MG IV SOLR
INTRAVENOUS | Status: DC | PRN
  Administered 2011-10-21: 77 mg via INTRAVENOUS
  Administered 2011-10-21: 3 mg via INTRAVENOUS
  Administered 2011-10-21: 10 mg via INTRAVENOUS

## 2011-10-21 MED ORDER — DOCUSATE SODIUM 100 MG PO CAPS: 200.0000 mg | ORAL_CAPSULE | Freq: Every day | ORAL | Status: DC

## 2011-10-21 MED ORDER — OXYCODONE HCL 5 MG PO TABS
5.0000 mg | ORAL_TABLET | ORAL | Status: DC | PRN
  Administered 2011-10-22 – 2011-10-24 (×12): 10 mg via ORAL
  Administered 2011-10-25: 5 mg via ORAL
  Administered 2011-10-25: 10 mg via ORAL
  Administered 2011-10-25 – 2011-10-26 (×4): 5 mg via ORAL
  Administered 2011-10-26: 10 mg via ORAL
  Administered 2011-10-26 (×2): 5 mg via ORAL
  Administered 2011-10-27: 10 mg via ORAL
  Administered 2011-10-27: 5 mg via ORAL
  Administered 2011-10-27: 10 mg via ORAL
  Administered 2011-10-27: 5 mg via ORAL
  Administered 2011-10-28 – 2011-10-29 (×4): 10 mg via ORAL
  Filled 2011-10-21: qty 2
  Filled 2011-10-21: qty 1
  Filled 2011-10-21 (×2): qty 2
  Filled 2011-10-21: qty 1
  Filled 2011-10-21: qty 2
  Filled 2011-10-21: qty 1
  Filled 2011-10-21: qty 2
  Filled 2011-10-21: qty 1
  Filled 2011-10-21 (×3): qty 2
  Filled 2011-10-21: qty 1
  Filled 2011-10-21 (×7): qty 2
  Filled 2011-10-21 (×3): qty 1
  Filled 2011-10-21: qty 2
  Filled 2011-10-21: qty 1
  Filled 2011-10-21 (×4): qty 2

## 2011-10-21 MED ORDER — SODIUM CHLORIDE 0.9 % IV SOLN
0.1000 ug/kg/h | INTRAVENOUS | Status: AC
  Filled 2011-10-21: qty 4

## 2011-10-21 MED ORDER — MORPHINE SULFATE 4 MG/ML IJ SOLN: 2.0000 mg | INTRAMUSCULAR | Status: DC | PRN

## 2011-10-21 MED ORDER — MIDAZOLAM HCL 10 MG/2ML IJ SOLN: 10.0000 mg | Freq: Once | INTRAMUSCULAR | Status: DC

## 2011-10-21 MED ORDER — SODIUM CHLORIDE 0.9 % IJ SOLN: 3.0000 mL | Freq: Two times a day (BID) | INTRAMUSCULAR | Status: DC

## 2011-10-21 MED ORDER — ALBUMIN HUMAN 5 % IV SOLN
250.0000 mL | INTRAVENOUS | Status: AC | PRN
  Administered 2011-10-22: 250 mL via INTRAVENOUS

## 2011-10-21 MED ORDER — VANCOMYCIN HCL 1000 MG IV SOLR
1000.0000 mg | Freq: Once | INTRAVENOUS | Status: AC
  Administered 2011-10-21: 1000 mg via INTRAVENOUS
  Filled 2011-10-21 (×2): qty 1000

## 2011-10-21 MED ORDER — PHENYLEPHRINE HCL 10 MG/ML IJ SOLN
0.0000 ug/min | INTRAVENOUS | Status: DC
  Filled 2011-10-21: qty 2

## 2011-10-21 MED ORDER — FENTANYL CITRATE 0.05 MG/ML IJ SOLN: 250.0000 ug | Freq: Once | INTRAMUSCULAR | Status: DC

## 2011-10-21 MED ORDER — ACETAMINOPHEN 650 MG RE SUPP
650.0000 mg | RECTAL | Status: AC
  Administered 2011-10-21: 650 mg via RECTAL

## 2011-10-21 MED ORDER — POTASSIUM CHLORIDE 2 MEQ/ML IV SOLN
80.0000 meq | INTRAVENOUS | Status: AC
  Filled 2011-10-21: qty 40

## 2011-10-21 MED ORDER — AMINOCAPROIC ACID 250 MG/ML IV SOLN
INTRAVENOUS | Status: AC
  Filled 2011-10-21: qty 40

## 2011-10-21 MED ORDER — MORPHINE SULFATE 2 MG/ML IJ SOLN: 1.0000 mg | INTRAMUSCULAR | Status: DC | PRN

## 2011-10-21 MED ORDER — PHENYLEPHRINE HCL 10 MG/ML IJ SOLN
30.0000 ug/min | INTRAVENOUS | Status: AC
  Filled 2011-10-21: qty 2

## 2011-10-21 SURGICAL SUPPLY — 166 items
ADAPTER CARDIO PERF ANTE/RETRO (ADAPTER) ×2 IMPLANT
ADPR PRFSN 84XANTGRD RTRGD (ADAPTER) ×1
APL SKNCLS STERI-STRIP NONHPOA (GAUZE/BANDAGES/DRESSINGS) ×1
APPLICATOR COTTON TIP 6IN STRL (MISCELLANEOUS) IMPLANT
APPLIER CLIP 9.375 MED OPEN (MISCELLANEOUS)
APPLIER CLIP 9.375 SM OPEN (CLIP)
APR CLP MED 9.3 20 MLT OPN (MISCELLANEOUS)
APR CLP SM 9.3 20 MLT OPN (CLIP)
ATTRACTOMAT 16X20 MAGNETIC DRP (DRAPES) ×4 IMPLANT
BAG DECANTER FOR FLEXI CONT (MISCELLANEOUS) ×4 IMPLANT
BANDAGE ELASTIC 4 VELCRO ST LF (GAUZE/BANDAGES/DRESSINGS) ×2 IMPLANT
BANDAGE ELASTIC 6 VELCRO ST LF (GAUZE/BANDAGES/DRESSINGS) ×2 IMPLANT
BANDAGE GAUZE ELAST BULKY 4 IN (GAUZE/BANDAGES/DRESSINGS) ×2 IMPLANT
BASKET HEART (ORDER IN 25'S) (MISCELLANEOUS) ×1
BASKET HEART (ORDER IN 25S) (MISCELLANEOUS) ×1 IMPLANT
BENZOIN TINCTURE PRP APPL 2/3 (GAUZE/BANDAGES/DRESSINGS) ×2 IMPLANT
BLADE MINI RND TIP GREEN BEAV (BLADE) ×1 IMPLANT
BLADE STERNUM SYSTEM 6 (BLADE) ×5 IMPLANT
BLADE SURG 11 STRL SS (BLADE) ×4 IMPLANT
BLADE SURG ROTATE 9660 (MISCELLANEOUS) IMPLANT
CANISTER SUCTION 2500CC (MISCELLANEOUS) ×4 IMPLANT
CANN PRFSN 3/8X14X24FR PCFC (MISCELLANEOUS)
CANN PRFSN 3/8XCNCT ST RT ANG (MISCELLANEOUS)
CANNULA FEM VENOUS REMOTE 22FR (CANNULA) ×1 IMPLANT
CANNULA GUNDRY RCSP 15FR (MISCELLANEOUS) IMPLANT
CANNULA PRFSN 3/8X14X24FR PCFC (MISCELLANEOUS) IMPLANT
CANNULA PRFSN 3/8XCNCT RT ANG (MISCELLANEOUS) IMPLANT
CANNULA VEN MTL TIP RT (MISCELLANEOUS)
CANNULA VENNOUS METAL TIP 20FR (CANNULA) ×2 IMPLANT
CATH CPB KIT OWEN (MISCELLANEOUS) ×2 IMPLANT
CATH FOLEY 2WAY SLVR  5CC 14FR (CATHETERS)
CATH FOLEY 2WAY SLVR 5CC 14FR (CATHETERS) IMPLANT
CATH HEART VENT LEFT (CATHETERS) IMPLANT
CATH ROBINSON RED A/P 18FR (CATHETERS) ×5 IMPLANT
CATH THORACIC 28FR (CATHETERS) IMPLANT
CATH THORACIC 28FR RT ANG (CATHETERS) IMPLANT
CATH THORACIC 36FR (CATHETERS) ×2 IMPLANT
CATH THORACIC 36FR RT ANG (CATHETERS) ×2 IMPLANT
CLIP APPLIE 9.375 MED OPEN (MISCELLANEOUS) IMPLANT
CLIP APPLIE 9.375 SM OPEN (CLIP) IMPLANT
CLIP FOGARTY SPRING 6M (CLIP) ×1 IMPLANT
CLIP RETRACTION 3.0MM CORONARY (MISCELLANEOUS) ×1 IMPLANT
CLIP TI MEDIUM 24 (CLIP) IMPLANT
CLIP TI MEDIUM 6 (CLIP) IMPLANT
CLIP TI WIDE RED SMALL 24 (CLIP) IMPLANT
CLIP TI WIDE RED SMALL 6 (CLIP) IMPLANT
CLOTH BEACON ORANGE TIMEOUT ST (SAFETY) ×4 IMPLANT
CONN 1/2X1/2X1/2  BEN (MISCELLANEOUS) ×1
CONN 1/2X1/2X1/2 BEN (MISCELLANEOUS) ×1 IMPLANT
CONN 3/8X1/2 ST GISH (MISCELLANEOUS) ×4 IMPLANT
CONN 3/8X3/8 GISH STERILE (MISCELLANEOUS) ×1 IMPLANT
CONN ST 1/4X3/8  BEN (MISCELLANEOUS) ×1
CONN ST 1/4X3/8 BEN (MISCELLANEOUS) IMPLANT
CONN Y 3/8X3/8X3/8  BEN (MISCELLANEOUS)
CONN Y 3/8X3/8X3/8 BEN (MISCELLANEOUS) IMPLANT
COVER SURGICAL LIGHT HANDLE (MISCELLANEOUS) ×8 IMPLANT
CRADLE DONUT ADULT HEAD (MISCELLANEOUS) ×4 IMPLANT
DRAIN CHANNEL 32F RND 10.7 FF (WOUND CARE) ×2 IMPLANT
DRAPE CARDIOVASCULAR INCISE (DRAPES) ×2
DRAPE SLUSH/WARMER DISC (DRAPES) IMPLANT
DRAPE SRG 135X102X78XABS (DRAPES) ×1 IMPLANT
DRAPE WARM FLUID 44X44 (DRAPE) ×1 IMPLANT
DRSG COVADERM 4X14 (GAUZE/BANDAGES/DRESSINGS) ×3 IMPLANT
ELECT REM PT RETURN 9FT ADLT (ELECTROSURGICAL) ×8
ELECTRODE REM PT RTRN 9FT ADLT (ELECTROSURGICAL) ×4 IMPLANT
GAUZE KERLIX 2  STERILE LF (GAUZE/BANDAGES/DRESSINGS) ×1 IMPLANT
GLOVE BIO SURGEON STRL SZ 6 (GLOVE) ×2 IMPLANT
GLOVE BIO SURGEON STRL SZ 6.5 (GLOVE) ×4 IMPLANT
GLOVE BIO SURGEON STRL SZ7 (GLOVE) ×1 IMPLANT
GLOVE BIO SURGEON STRL SZ7.5 (GLOVE) ×1 IMPLANT
GLOVE BIOGEL PI IND STRL 6 (GLOVE) IMPLANT
GLOVE BIOGEL PI IND STRL 6.5 (GLOVE) IMPLANT
GLOVE BIOGEL PI IND STRL 7.0 (GLOVE) IMPLANT
GLOVE BIOGEL PI INDICATOR 6 (GLOVE) ×2
GLOVE BIOGEL PI INDICATOR 6.5 (GLOVE)
GLOVE BIOGEL PI INDICATOR 7.0 (GLOVE)
GLOVE EUDERMIC 7 POWDERFREE (GLOVE) IMPLANT
GLOVE ORTHO TXT STRL SZ7.5 (GLOVE) ×8 IMPLANT
GOWN STRL NON-REIN LRG LVL3 (GOWN DISPOSABLE) ×16 IMPLANT
HEMOSTAT POWDER SURGIFOAM 1G (HEMOSTASIS) ×8 IMPLANT
INSERT FOGARTY 61MM (MISCELLANEOUS) IMPLANT
INSERT FOGARTY XLG (MISCELLANEOUS) ×6 IMPLANT
KIT BASIN OR (CUSTOM PROCEDURE TRAY) ×4 IMPLANT
KIT DILATOR VASC 18G NDL (KITS) ×1 IMPLANT
KIT PAIN CUSTOM (MISCELLANEOUS) IMPLANT
KIT ROOM TURNOVER OR (KITS) ×4 IMPLANT
KIT SUCTION CATH 14FR (SUCTIONS) ×4 IMPLANT
KIT VASOVIEW W/TROCAR VH 2000 (KITS) ×2 IMPLANT
LEAD PACING MYOCARDI (MISCELLANEOUS) ×2 IMPLANT
LINE VENT (MISCELLANEOUS) ×1 IMPLANT
LOOP VESSEL SUPERMAXI WHITE (MISCELLANEOUS) ×1 IMPLANT
MARKER GRAFT CORONARY BYPASS (MISCELLANEOUS) ×6 IMPLANT
NS IRRIG 1000ML POUR BTL (IV SOLUTION) ×18 IMPLANT
PACK OPEN HEART (CUSTOM PROCEDURE TRAY) ×4 IMPLANT
PAD ARMBOARD 7.5X6 YLW CONV (MISCELLANEOUS) ×8 IMPLANT
PENCIL BUTTON HOLSTER BLD 10FT (ELECTRODE) ×3 IMPLANT
PUNCH AORTIC ROTATE 4.0MM (MISCELLANEOUS) ×1 IMPLANT
PUNCH AORTIC ROTATE 4.5MM 8IN (MISCELLANEOUS) IMPLANT
PUNCH AORTIC ROTATE 5MM 8IN (MISCELLANEOUS) IMPLANT
RING MITRAL MEMO 3D 26MM SMD26 (Prosthesis & Implant Heart) ×1 IMPLANT
SET CARDIOPLEGIA MPS 5001102 (MISCELLANEOUS) ×1 IMPLANT
SET IRRIG TUBING LAPAROSCOPIC (IRRIGATION / IRRIGATOR) ×2 IMPLANT
SET Y EXTENSION LINE CSP (IV SETS) ×1 IMPLANT
SOLUTION ANTI FOG 6CC (MISCELLANEOUS) IMPLANT
SPONGE GAUZE 4X4 12PLY (GAUZE/BANDAGES/DRESSINGS) ×5 IMPLANT
SPONGE INTESTINAL PEANUT (DISPOSABLE) IMPLANT
SPONGE LAP 18X18 X RAY DECT (DISPOSABLE) IMPLANT
SPONGE LAP 4X18 X RAY DECT (DISPOSABLE) IMPLANT
STOPCOCK 4 WAY LG BORE MALE ST (IV SETS) IMPLANT
STRIP CLOSURE SKIN 1/2X4 (GAUZE/BANDAGES/DRESSINGS) ×2 IMPLANT
SUCKER INTRACARDIAC WEIGHTED (SUCKER) ×2 IMPLANT
SUT BONE WAX W31G (SUTURE) ×4 IMPLANT
SUT ETHIBOND (SUTURE) ×4 IMPLANT
SUT ETHIBOND 2 0 SH (SUTURE) ×6 IMPLANT
SUT ETHIBOND 2 0 SH 36X2 (SUTURE) ×2 IMPLANT
SUT ETHIBOND 2 0 V4 (SUTURE) IMPLANT
SUT ETHIBOND 2 0V4 GREEN (SUTURE) IMPLANT
SUT ETHIBOND 4 0 TF (SUTURE) IMPLANT
SUT ETHIBOND 5 0 C 1 30 (SUTURE) ×2 IMPLANT
SUT ETHIBOND X763 2 0 SH 1 (SUTURE) ×5 IMPLANT
SUT MNCRL AB 3-0 PS2 18 (SUTURE) ×4 IMPLANT
SUT MNCRL AB 4-0 PS2 18 (SUTURE) IMPLANT
SUT PDS AB 1 CTX 36 (SUTURE) ×4 IMPLANT
SUT PROLENE 2 0 SH DA (SUTURE) IMPLANT
SUT PROLENE 3 0 SH 1 (SUTURE) ×3 IMPLANT
SUT PROLENE 3 0 SH DA (SUTURE) ×2 IMPLANT
SUT PROLENE 3 0 SH1 36 (SUTURE) ×1 IMPLANT
SUT PROLENE 4 0 RB 1 (SUTURE) ×4
SUT PROLENE 4 0 SH DA (SUTURE) ×5 IMPLANT
SUT PROLENE 4-0 RB1 .5 CRCL 36 (SUTURE) ×2 IMPLANT
SUT PROLENE 5 0 C 1 36 (SUTURE) IMPLANT
SUT PROLENE 6 0 C 1 30 (SUTURE) ×4 IMPLANT
SUT PROLENE 6 0 CC (SUTURE) IMPLANT
SUT PROLENE 7 0 BV 1 (SUTURE) IMPLANT
SUT PROLENE 7 0 BV1 MDA (SUTURE) IMPLANT
SUT PROLENE 7.0 RB 3 (SUTURE) ×12 IMPLANT
SUT PROLENE 8 0 BV175 6 (SUTURE) IMPLANT
SUT PROLENE BLUE 7 0 (SUTURE) ×2 IMPLANT
SUT PROLENE POLY MONO (SUTURE) ×2 IMPLANT
SUT SILK  1 MH (SUTURE) ×2
SUT SILK 1 MH (SUTURE) ×1 IMPLANT
SUT SILK 2 0 SH CR/8 (SUTURE) IMPLANT
SUT SILK 3 0 SH CR/8 (SUTURE) IMPLANT
SUT STEEL 6MS V (SUTURE) ×3 IMPLANT
SUT STEEL STERNAL CCS#1 18IN (SUTURE) IMPLANT
SUT STEEL SZ 6 DBL 3X14 BALL (SUTURE) ×2 IMPLANT
SUT VIC AB 1 CTX 36 (SUTURE)
SUT VIC AB 1 CTX36XBRD ANBCTR (SUTURE) IMPLANT
SUT VIC AB 2-0 CT1 27 (SUTURE) ×2
SUT VIC AB 2-0 CT1 TAPERPNT 27 (SUTURE) IMPLANT
SUT VIC AB 2-0 CTX 27 (SUTURE) IMPLANT
SUT VIC AB 2-0 UR6 27 (SUTURE) ×1 IMPLANT
SUT VIC AB 3-0 SH 27 (SUTURE)
SUT VIC AB 3-0 SH 27X BRD (SUTURE) IMPLANT
SUT VIC AB 3-0 X1 27 (SUTURE) IMPLANT
SUT VICRYL 4-0 PS2 18IN ABS (SUTURE) ×1 IMPLANT
SUTURE E-PAK OPEN HEART (SUTURE) ×2 IMPLANT
SYSTEM SAHARA CHEST DRAIN ATS (WOUND CARE) ×4 IMPLANT
TOWEL OR 17X24 6PK STRL BLUE (TOWEL DISPOSABLE) ×4 IMPLANT
TOWEL OR 17X26 10 PK STRL BLUE (TOWEL DISPOSABLE) ×5 IMPLANT
TRAY FOLEY IC TEMP SENS 14FR (CATHETERS) ×2 IMPLANT
TUBE SUCT INTRACARD DLP 20F (MISCELLANEOUS) ×2 IMPLANT
TUBING INSUFFLATION 10FT LAP (TUBING) ×4 IMPLANT
UNDERPAD 30X30 INCONTINENT (UNDERPADS AND DIAPERS) ×4 IMPLANT
VENT LEFT HEART 12002 (CATHETERS)
WATER STERILE IRR 1000ML POUR (IV SOLUTION) ×8 IMPLANT

## 2011-10-21 NOTE — Progress Notes (Signed)
  Echocardiogram Echocardiogram Transesophageal has been performed.  Jorje Guild [facility] 10/21/2011, 8:55 AM

## 2011-10-21 NOTE — Progress Notes (Signed)
UR Completed.  Tell Ashley [name] [phone] 10/21/2011  

## 2011-10-21 NOTE — Brief Op Note (Addendum)
10/21/2011  12:06 PM  PATIENT:  Ashley [name]  46 y.o. female  PRE-OPERATIVE DIAGNOSIS:  Coronary artery disease; mitral regurgitation  POST-OPERATIVE DIAGNOSIS:  Coronary artery disease; mitral regurgitation  PROCEDURE:  Procedure(s): MITRAL VALVE REPAIR (MVR) (26 mm MEMO 3D annuloplasty ring) CORONARY ARTERY BYPASS GRAFTING (CABG) x 4 (LIMA-LAD, SVG-D, SVG-OM, SVG-PDA), EVH right leg  SURGEON:  Surgeon(s): Purcell Nails, MD  ASSISTANT: Coral Ceo, PA-C  ANESTHESIA:   general  PATIENT CONDITION:  ICU - intubated and hemodynamically stable.  PRE-OPERATIVE WEIGHT: 85 kg   [name] H 10/21/2011 1:31 PM

## 2011-10-21 NOTE — Op Note (Signed)
Intraoperative Transesophageal Echocardiography Note:   Ashley [name] is a 46 year old female with a history of a heart murmur and mitral valve prolapse. Following complaints of  abdominal pain she underwent right and left heart catheterization. This revealed severe  three-vessel artery disease with moderate left ventricular dysfunction and moderate mitral regurgitation. She subsequently underwent transesophageal echocardiography which revealed severe mitral regurgitation.  He is now scheduled to undergo coronary artery bypass grafting and possible mitral valve repair by Dr. Cornelius Moras. Operative transesophageal echocardiography was requested to evaluate the mitral valve and assist with  repair and to serve as a monitor for intraoperative volume status.  The patient was brought to the operating room and general anesthesia was induced without difficulty. She was intubated without difficulty. Following orogastric suctioning, the transesophageal echocardiography probe was inserted into the esophagus without difficulty.  Impression: Pre-bypass Findings:  1. Mitral valve: The mitral leaflets were of normal thickness and coapted normally with no prolapse or fluttering. There were no flail segments or billowing segments noted. There was a central jet of mitral regurgitation which was judged to be 2+. The pulse-wave Doppler interrogation of the left and right upper pulmonary veins showed normal systolic and diastolic forward flow.   2. Aortic valve: The aortic valve is trileaflet and the leaflets were thin and coapted normally. There was no aortic insufficiency.  3. Left ventricle: There was mild concentric left ventricular hypertrophy. Left ventricular wall thickness measured 1.0-1.1 cm. There was be normal contractility in all segments with an ejection fraction estimated at 55-60%. Left ventricular end-diastolic diameter measured 5.2 cm in the short axis at the mid-papillary level.  4. Right Ventricle: The  right ventricle was normal in size and there was normal contractility of the right ventricular free wall consistent with normal right ventricular function.  5. Tricuspid Valve: The tricuspid valve appeared structurally normal and there was trace tricuspid insufficiency.  6. Inter-atrial Septum: Inter-atrial septum is intact without evidence of patent foramen ovale or atrial septal defect by color Doppler or bubble study.   7. Left Atrium: The left atrium was normal in size and there was no thrombus noted in the left atrium and left atrial appendage.  8. Ascending Aorta: The ascending aorta showed mild thickening of the walls. There was no significant atheromatous disease noted.  9.Descending Aorta: The descending aorta showed mild atheromatous disease and measured 10.0 cm in diameter.  Post-Bypass Findings:  1. Mitral Valve: There was an annuloplasty ring in the mitral position. The posterior leaflet was fixed and the anterior leaflet was freely mobile. There was no residual mitral insufficiency. Continuous-wave Doppler interrogation of the mitral inflow revealed a mean gradient of 6 mm of mercury in the mitral valve area 2.14 cm using the pressure half-time method.  2. Aortic Valve: The aortic valve was normal and was unchanged from the pre-bypass study.  3. Left ventricle: There was normal contractility of the anterior lateral and antero-septal walls. There appeared to be hypokinesis of the inferior wall but there was contractile activity noted. Ejection fraction was estimated at 50%.  4. Right Ventricle: There is some decreased contractility of the right ventricular free wall but this appeared to improve during the course of the post-bypass period.  5. Tricuspid Valve: Early in the post-bypass period 1-2+ tricuspid regurgitation was noted, but this resolved during the post-bypass period and   trace tricuspid regurgitation was noted following closure of the chest.  [name].D.

## 2011-10-21 NOTE — Preoperative (Signed)
Beta Blockers   Reason not to administer Beta Blockers:Not Applicable 

## 2011-10-21 NOTE — Procedures (Signed)
Extubation Procedure Note  Patient Details:   Name: Ashley [name] DOB: 11-08-1965 MRN: [id number]   Airway Documentation:     Evaluation  O2 sats: stable throughout and currently acceptable Complications: No apparent complications Patient did tolerate procedure well. Bilateral Breath Sounds: Other (Comment) (coarse)   Yes Pt awake and alert, extubated per protocol, placed on 4L [place], sat 96%. BBS cl, Positive cuff leak, NIF-24, VC 750. Pt able to vocalize.   Arloa Koh 10/21/2011, 6:56 PM

## 2011-10-21 NOTE — Op Note (Signed)
CARDIOTHORACIC SURGERY OPERATIVE NOTE  Date of Procedure:  10/21/2011  Preoperative Diagnosis:   Severe 3-vessel Coronary Artery Disease  Mitral Regurgitation  Postoperative Diagnosis: Same  Procedure:    Coronary Artery Bypass Grafting x 4   Left Internal Mammary Artery to Distal Left Anterior Descending Coronary Artery  Saphenous Vein Graft to Posterior Descending Coronary Artery  Saphenous Vein Graft to Obtuse Marginal Branch of Left Circumflex Coronary Artery  Saphenous Vein Graft to Diagonal Branch of the Left Anterior Descending Coronary Artery  Endoscopic Vein Harvest from Right Thigh and Lower Leg   Mitral Valve Repair  Ashley [name] 3D ring annuloplasty (size 26mm, Catalog [id number], serial [id number])    Surgeon: Salvatore Decent. Cornelius Moras, MD Assistant: Coral Ceo, PA-C Anesthesia: Guadalupe Maple, [place]  Operative Findings:   Normal left ventricular systolic function  Good quality left internal mammary artery conduit  Good quality saphenous vein conduit  Good quality target vessels for grafting  Type IIIB dysfunction with moderate mitral regurgitation  No residual mitral regurgitation following successful valve repair     BRIEF CLINICAL NOTE AND INDICATIONS FOR SURGERY  Patient is a 46 year old married white female from [place] with history of heart murmur discovered 20 years ago when the patient suffered a stroke at age 89. She was told that she had mitral valve prolapse at that time. She otherwise has no cardiac history and had not seen a physician in many years. She was hospitalized in September 2012 because of right-sided abdominal discomfort. At that time there was some question as to whether or not she might have cholecystitis, but a high scan was negative. Gallbladder ultrasound revealed thickened gallbladder wall. The patient was seen in consultation by Dr. Renee Harder from the general surgery service at that time. Her symptoms resolved and were thought perhaps  to be related to passive congestion of the liver. She was also noted to have severe mitral regurgitation with moderate left ventricular dysfunction. She had severe hypertension that was poorly controlled at the time of her initial hospital presentation. Since then she has been followed carefully by Dr. Tenny Craw. She ultimately underwent left and right heart catheterization by Dr. Riley Kill 06/07/2011. This revealed severe three-vessel coronary artery disease with moderate left ventricular dysfunction and at least moderate mitral regurgitation. The patient has been seen back since then in followup by Dr. Tenny Craw and underwent transesophageal echocardiogram 06/30/2011. This revealed severe mitral regurgitation. The patient was referred for possible elective surgical intervention and originally seen in consultation last November.  She desired to hold off on surgery until after the first of the year and now presents for elective surgical intervention.  The patient has been seen in consultation and counseled at length regarding the indications, risks and potential benefits of surgery.  All questions have been answered, and the patient provides full informed consent for the operation as described.     DETAILS OF THE OPERATIVE PROCEDURE  The patient is brought to the operating room on the above mentioned date and central monitoring was established by the anesthesia team including placement of Swan-Ganz catheter and radial arterial line. The patient is placed in the supine position on the operating table.  Intravenous antibiotics are administered. General endotracheal anesthesia is induced uneventfully. A Foley catheter is placed.  Baseline transesophageal echocardiogram was performed.  Findings were notable for essentially normal LV systolic function with mild inferior wall hypokinesis.  There was moderate (2+) mitral regurgitation with Type IIIB dysfunction.  There were no other abnormalities.  The patient's chest,  abdomen, both groins, and both lower extremities are prepared and draped in a sterile manner. A time out procedure is performed.  A median sternotomy incision was performed and the left internal mammary artery is dissected from the chest wall and prepared for bypass grafting. The left internal mammary artery is notably good quality conduit. Simultaneously, saphenous vein is obtained from the patient's right thigh and leg using endoscopic vein harvest technique. The saphenous vein is notably good quality conduit. After removal of the saphenous vein, the small surgical incisions in the lower extremity are closed with absorbable suture. Following systemic heparinization, the left internal mammary artery was transected distally noted to have excellent flow.  The pericardium is opened. The ascending aorta is normal in appearance. The right common femoral vein is cannulated using the Seldinger technique and a guidewire advanced into the right atrium using TEE guidance.  The patient is heparinized systemically and the right common femoral vein cannulated using a 22 Fr long femoral venous cannula.  The ascending aorta is cannulated for cardiopulmonary bypass.  Adequate heparinization is verified.   A retrograde cardioplegia cannula is placed through the right atrium into the coronary sinus.   The entire pre-bypass portion of the operation was notable for stable hemodynamics.  Cardiopulmonary bypass was begun and the surface of the heart is inspected.  A second venous cannula is placed directly into the superior vena cava.  Distal target vessels are selected for coronary artery bypass grafting.  A cardioplegia cannula is placed in the ascending aorta.  A temperature probe was placed in the interventricular septum.  The patient is cooled to 32C systemic temperature.  The aortic cross clamp is applied and cold blood cardioplegia is delivered initially in an antegrade fashion through the aortic root.  Supplemental  cardioplegia is given retrograde through the coronary sinus catheter.  Iced saline slush is applied for topical hypothermia.  The initial cardioplegic arrest is rapid with early diastolic arrest.  Repeat doses of cardioplegia are administered intermittently throughout the entire cross clamp portion of the operation through the aortic root, through the coronary sinus catheter, and through subsequently placed vein grafts in order to maintain completely flat electrocardiogram and septal myocardial temperature below 15C.  Myocardial protection was felt to be excellent.  The following distal coronary artery bypass grafts were performed:   The posterior descending branch of the right coronary artery was grafted using a reversed saphenous vein graft in an end-to-side fashion.  At the site of distal anastomosis the target vessel was good quality and measured approximately 1.8 mm in diameter.  The obtuse marginal branch of the left circumflex coronary artery was grafted using a reversed saphenous vein graft in an end-to-side fashion.  At the site of distal anastomosis the target vessel was good quality and measured approximately 2.0 mm in diameter.  The diagonal branch of the left anterior descending coronary artery was grafted using a reversed saphenous vein graft in an end-to-side fashion.  At the site of distal anastomosis the target vessel was good quality and measured approximately 1.5 mm in diameter.  The distal left anterior coronary artery was grafted with the left internal mammary artery in an end-to-side fashion.  At the site of distal anastomosis the target vessel was good quality and measured approximately 2.0 mm in diameter.  A left atriotomy incision was performed through the interatrial groove and extended partially across the back wall of the left atrium after opening the oblique sinus inferiorly.  The mitral valve is exposed using  a self-retaining retractor.  The mitral valve was inspected and  notable for normal leaflets and subvalvular apparatus.  The valve leaflets were relatively small in size .    Interrupted 2-0 Ethibond horizontal mattress sutures were placed circumferentially around the entire mitral annulus.  The valve was sized to accept a 26 mm annuloplasty ring based upon the overall surface area of the anterior leaflet as well as the distance between the anterior and posterior commissures and the vertical height of the anterior leaflet.    A Ashley [name] 3D annuloplasty ring (size 26mm, Catalog [id number], serial [id number]) was implanted uneventfully.    After completion of the valve repair the valve was tested with saline and appeared to be perfectly competant.  There was a broad, symmetrical line of coaptation between the anterior and posterior leaflets and no residual mitral regurgitation.  Rewarming was begun.  The atriotomy was closed using a 2-layer closure of running 3-0 Prolene suture after placing a sump drain across the mitral valve to serve as a left ventricular vent.  All proximal vein graft anastomoses were placed directly to the ascending aorta prior to removal of the aortic cross clamp.  The septal myocardial temperature rose rapidly after reperfusion of the left internal mammary artery graft.  One final dose of warm retrograde "hot shot" cardioplegia was administered retrograde through the coronary sinus catheter while all air was evacuated through the aortic root.  The aortic cross clamp was removed after a total cross clamp time of 118 minutes.  All proximal and distal coronary anastomoses were inspected for hemostasis and appropriate graft orientation.  Epicardial pacing wires are fixed to the right ventricular outflow tract and to the right atrial appendage. The patient is rewarmed to 37C temperature. The aortic and left ventricular vents are removed.  The patient is weaned and disconnected from cardiopulmonary bypass.  The patient's rhythm at separation from bypass was  AV paced.  The patient was weaned from cardioplegic bypass without any inotropic support. Total cardiopulmonary bypass time for the operation was 147 minutes.  Followup transesophageal echocardiogram performed after separation from bypass revealed a well-seated mitral annuloplasty ring and a mitral valve that was functioning normally and without any residual mitral regurgitation.  Left ventricular function was unchanged from preoperatively.  The aortic and superior vena cava cannula were removed uneventfully. Protamine was administered to reverse the anticoagulation. The femoral venous cannula was removed and manual pressure held on the groin for 30 minutes.  The mediastinum and pleural space were inspected for hemostasis and irrigated with saline solution. The mediastinum and the left pleural space were drained using 3 chest tubes placed through separate stab incisions inferiorly.  The soft tissues anterior to the aorta were reapproximated loosely. The sternum is closed with double strength sternal wire. The soft tissues anterior to the sternum were closed in multiple layers and the skin is closed with a running subcuticular skin closure.  The patient tolerated the procedure well and is transported to the surgical intensive care in stable condition. There are no intraoperative complications. All sponge instrument and needle counts are verified correct at completion of the operation.   The post-bypass portion of the operation was notable for stable rhythm and hemodynamics.      Salvatore Decent. Cornelius Moras MD 10/21/2011 2:30 PM

## 2011-10-21 NOTE — Anesthesia Preprocedure Evaluation (Addendum)
Anesthesia Evaluation  Patient identified by MRN, date of birth, ID band Patient awake    Reviewed: Allergy & Precautions, H&P , NPO status , Patient's Chart, lab work & pertinent test results, reviewed documented beta blocker date and time   Airway Mallampati: I TM Distance: >3 FB     Dental  (+) Edentulous Upper and Edentulous Lower   Pulmonary former smoker clear to auscultation        Cardiovascular hypertension, Pt. on home beta blockers + CAD + Valvular Problems/Murmurs MR Regular Normal Echo - LV fxn normal. MR   Neuro/Psych cva x 20 yrs ago CVA, No Residual Symptoms    GI/Hepatic GERD-  Controlled and Medicated,  Endo/Other    Renal/GU      Musculoskeletal   Abdominal   Peds  Hematology   Anesthesia Other Findings   Reproductive/Obstetrics                         Anesthesia Physical Anesthesia Plan  ASA: III  Anesthesia Plan: General   Post-op Pain Management:    Induction: Intravenous  Airway Management Planned: Oral ETT  Additional Equipment: Arterial line, CVP, PA Cath, 3D TEE and Ultrasound Guidance Line Placement  Intra-op Plan:   Post-operative Plan: Post-operative intubation/ventilation  Informed Consent: I have reviewed the patients History and Physical, chart, labs and discussed the procedure including the risks, benefits and alternatives for the proposed anesthesia with the patient or authorized representative who has indicated his/her understanding and acceptance.   Dental advisory given  Plan Discussed with: CRNA, Anesthesiologist and Surgeon  Anesthesia Plan Comments: (Severe 3 vessel CAD Ischemic mitral regurgitation htn H/o stroke Smoking hx.  Plan GA )      Anesthesia Quick Evaluation

## 2011-10-21 NOTE — Interval H&P Note (Signed)
History and Physical Interval Note:  10/21/2011 7:10 AM  Ashley [name]  has presented today for surgery, with the diagnosis of CAD,MITRAL REGURGITATION  The various methods of treatment have been discussed with the patient and family. After consideration of risks, benefits and other options for treatment, the patient has consented to  Procedure(s) (LRB): MITRAL VALVE REPAIR (MVR) (N/A) CORONARY ARTERY BYPASS GRAFTING (CABG) (N/A) as a surgical intervention .  The patients' history has been reviewed, patient examined, no change in status, stable for surgery.  I have reviewed the patients' chart and labs.  Questions were answered to the patient's satisfaction.     Versie Fleener H

## 2011-10-21 NOTE — Transfer of Care (Signed)
Immediate Anesthesia Transfer of Care Note  Patient: Ashley [name]  Procedure(s) Performed: Procedure(s) (LRB): MITRAL VALVE REPAIR (MVR) (N/A) CORONARY ARTERY BYPASS GRAFTING (CABG) (N/A)  Patient Location: SICU  Anesthesia Type: General  Level of Consciousness: Patient remains intubated per anesthesia plan  Airway & Oxygen Therapy: Patient remains intubated per anesthesia plan and Patient placed on Ventilator (see vital sign flow sheet for setting)  Post-op Assessment: Post -op Vital signs reviewed and stable  Post vital signs: Reviewed and stable  Complications: No apparent anesthesia complications

## 2011-10-21 NOTE — OR Nursing (Signed)
12:50 1st call to SICU, call to vol. Desk to inform family off pump.  2nd call to SICU @ 13:30.

## 2011-10-21 NOTE — Progress Notes (Signed)
Brief postop note  Status post CABG x4 and mitral valve annuloplasty repair  A paced, extubated, respiratory status stable. No significant chest tube drainage. Hematocrit 25 CABG 153 Doing well

## 2011-10-21 NOTE — Anesthesia Procedure Notes (Signed)
Procedure Name: Intubation Date/Time: 10/21/2011 8:17 AM Performed by: Glendora Score Pre-anesthesia Checklist: Patient identified, Emergency Drugs available and Patient being monitored Patient Re-evaluated:Patient Re-evaluated prior to inductionOxygen Delivery Method: Circle system utilized Preoxygenation: Pre-oxygenation with 100% oxygen Intubation Type: IV induction Ventilation: Mask ventilation without difficulty and Oral airway inserted - appropriate to patient size Laryngoscope Size: Hyacinth Meeker and 2 Grade View: Grade I Tube type: Oral Tube size: 8.0 mm Number of attempts: 1 Airway Equipment and Method: Stylet Placement Confirmation: ETT inserted through vocal cords under direct vision,  positive ETCO2 and breath sounds checked- equal and bilateral Secured at: 21 cm Dental Injury: Teeth and Oropharynx as per pre-operative assessment

## 2011-10-22 ENCOUNTER — Inpatient Hospital Stay (HOSPITAL_COMMUNITY): Payer: Medicaid Other

## 2011-10-22 DIAGNOSIS — IMO0001 Reserved for inherently not codable concepts without codable children: Secondary | ICD-10-CM

## 2011-10-22 DIAGNOSIS — E1165 Type 2 diabetes mellitus with hyperglycemia: Secondary | ICD-10-CM

## 2011-10-22 LAB — POCT I-STAT 3, ART BLOOD GAS (G3+)
Bicarbonate: 22 mEq/L (ref 20.0–24.0)
pH, Arterial: 7.363 (ref 7.350–7.400)
pO2, Arterial: 50 mmHg — ABNORMAL LOW (ref 80.0–100.0)

## 2011-10-22 LAB — GLUCOSE, CAPILLARY
Glucose-Capillary: 106 mg/dL — ABNORMAL HIGH (ref 70–99)
Glucose-Capillary: 118 mg/dL — ABNORMAL HIGH (ref 70–99)
Glucose-Capillary: 126 mg/dL — ABNORMAL HIGH (ref 70–99)
Glucose-Capillary: 94 mg/dL (ref 70–99)
Glucose-Capillary: 95 mg/dL (ref 70–99)
Glucose-Capillary: 99 mg/dL (ref 70–99)

## 2011-10-22 LAB — BASIC METABOLIC PANEL
Chloride: 104 mEq/L (ref 96–112)
GFR calc Af Amer: 58 mL/min — ABNORMAL LOW (ref >90)
Potassium: 3.7 mEq/L (ref 3.5–5.1)
Sodium: 135 mEq/L (ref 135–145)

## 2011-10-22 LAB — POCT I-STAT, CHEM 8
BUN: 16 mg/dL (ref 6–23)
Calcium, Ion: 1.21 mmol/L (ref 1.12–1.32)
Chloride: 104 mEq/L (ref 96–112)
HCT: 24 % — ABNORMAL LOW (ref 36.0–46.0)
Potassium: 4.3 mEq/L (ref 3.5–5.1)

## 2011-10-22 LAB — CBC
HCT: 23.8 % — ABNORMAL LOW (ref 36.0–46.0)
MCH: 34 pg (ref 26.0–34.0)
MCV: 98.8 fL (ref 78.0–100.0)
Platelets: 173 10*3/uL (ref 150–400)
Platelets: 186 10*3/uL (ref 150–400)
RDW: 13.2 % (ref 11.5–15.5)
RDW: 13.5 % (ref 11.5–15.5)
WBC: 12.5 10*3/uL — ABNORMAL HIGH (ref 4.0–10.5)
WBC: 17.1 10*3/uL — ABNORMAL HIGH (ref 4.0–10.5)

## 2011-10-22 LAB — MAGNESIUM: Magnesium: 2.7 mg/dL — ABNORMAL HIGH (ref 1.5–2.5)

## 2011-10-22 MED ORDER — POTASSIUM CHLORIDE 10 MEQ/50ML IV SOLN
10.0000 meq | INTRAVENOUS | Status: AC | PRN
  Administered 2011-10-22 (×3): 10 meq via INTRAVENOUS

## 2011-10-22 MED ORDER — INSULIN GLARGINE 100 UNIT/ML ~~LOC~~ SOLN
18.0000 [IU] | Freq: Every day | SUBCUTANEOUS | Status: DC
  Administered 2011-10-22 – 2011-10-26 (×4): 18 [IU] via SUBCUTANEOUS
  Filled 2011-10-22: qty 3

## 2011-10-22 MED ORDER — MORPHINE SULFATE 4 MG/ML IJ SOLN
INTRAMUSCULAR | Status: AC
  Administered 2011-10-22: 4 mg
  Filled 2011-10-22: qty 1

## 2011-10-22 MED ORDER — FUROSEMIDE 10 MG/ML IJ SOLN
40.0000 mg | Freq: Once | INTRAMUSCULAR | Status: AC
  Administered 2011-10-22: 40 mg via INTRAVENOUS

## 2011-10-22 MED ORDER — INSULIN ASPART 100 UNIT/ML ~~LOC~~ SOLN
0.0000 [IU] | SUBCUTANEOUS | Status: DC
  Administered 2011-10-22 – 2011-10-25 (×6): 2 [IU] via SUBCUTANEOUS
  Filled 2011-10-22: qty 3

## 2011-10-22 MED ORDER — DOPAMINE-DEXTROSE 3.2-5 MG/ML-% IV SOLN
3.0000 ug/kg/min | INTRAVENOUS | Status: DC
  Administered 2011-10-22: 3 ug/kg/min via INTRAVENOUS
  Filled 2011-10-22: qty 250

## 2011-10-22 MED ORDER — FUROSEMIDE 10 MG/ML IJ SOLN
40.0000 mg | Freq: Once | INTRAMUSCULAR | Status: AC
  Administered 2011-10-22: 40 mg via INTRAVENOUS
  Filled 2011-10-22: qty 4

## 2011-10-22 MED ORDER — FUROSEMIDE 10 MG/ML IJ SOLN
40.0000 mg | Freq: Once | INTRAMUSCULAR | Status: AC
  Administered 2011-10-22: 40 mg via INTRAVENOUS
  Filled 2011-10-22 (×2): qty 4

## 2011-10-22 MED ORDER — AMIODARONE HCL 200 MG PO TABS
200.0000 mg | ORAL_TABLET | Freq: Two times a day (BID) | ORAL | Status: DC
  Administered 2011-10-22 – 2011-10-26 (×8): 200 mg via ORAL
  Filled 2011-10-22 (×11): qty 1

## 2011-10-22 MED ORDER — TRAMADOL HCL 50 MG PO TABS
50.0000 mg | ORAL_TABLET | Freq: Four times a day (QID) | ORAL | Status: DC | PRN
  Administered 2011-10-22 – 2011-10-23 (×2): 50 mg via ORAL
  Filled 2011-10-22 (×2): qty 1

## 2011-10-22 NOTE — Progress Notes (Signed)
1 Day Post-Op Procedure(s) (LRB): MITRAL VALVE REPAIR (MVR) (N/A) CORONARY ARTERY BYPASS GRAFTING (CABG) (N/A) Subjective: Status post CABG x4 mitral valve angioplasty repair, extubated, breathing comfortably, neurologic intact. Minimal chest tube drainage, chest x-ray clear, off pressors  Objective: Vital signs in last 24 hours: Temp:  [97.7 F (36.5 C)-99.8 F (37.7 C)] 97.7 F (36.5 C) (03/02 1112) Pulse Rate:  [67-80] 67  (03/02 0900) Cardiac Rhythm:  [-] Normal sinus rhythm (03/02 0800) Resp:  [12-27] 20  (03/02 0900) BP: (83-126)/(47-65) 110/65 mmHg (03/02 0700) SpO2:  [77 %-100 %] 90 % (03/02 0900) Arterial Line BP: (106-133)/(45-69) 109/45 mmHg (03/02 0900) FiO2 (%):  [40 %-100 %] 100 % (03/02 0700) Weight:  [199 lb 8.3 oz (90.5 kg)] 199 lb 8.3 oz (90.5 kg) (03/02 0500)  Hemodynamic parameters for last 24 hours: PAP: (26-35)/(11-23) 28/15 mmHg  Intake/Output from previous day: 03/01 0701 - 03/02 0700 In: 5316.5 [I.V.:3991.5; Blood:275; IV Piggyback:1050] Out: 5220 [Urine:3080; Blood:1400; Chest Tube:740] Intake/Output this shift: Total I/O In: 40 [I.V.:40] Out: 400 [Urine:400]  Exam-alert and oriented Lungs clear cardiac rhythm regular Minimal peripheral edema, abdomen soft  Lab Results:  Basename 10/22/11 0420 10/21/11 2011 10/21/11 2000  WBC 12.5* -- 14.1*  HGB 8.3* 8.8* --  HCT 23.8* 26.0* --  PLT 173 -- 192   BMET:  Basename 10/22/11 0420 10/21/11 2011  NA 135 135  K 3.7 4.4  CL 104 105  CO2 20 --  GLUCOSE 121* 140*  BUN 13 14  CREATININE 1.26* 1.50*  CALCIUM 8.0* --    PT/INR:  Basename 10/21/11 1405  LABPROT 17.5*  INR 1.41   ABG    Component Value Date/Time   PHART 7.363 10/22/2011 0636   HCO3 22.0 10/22/2011 0636   TCO2 23 10/22/2011 0636   ACIDBASEDEF 3.0* 10/22/2011 0636   O2SAT 84.0 10/22/2011 0636   CBG (last 3)   Basename 10/22/11 1111 10/22/11 1027 10/22/11 0658  GLUCAP 94 105* 118*    Assessment/Plan: S/P Procedure(s)  (LRB): MITRAL VALVE REPAIR (MVR) (N/A) CORONARY ARTERY BYPASS GRAFTING (CABG) (N/A) Plan-remove lines and mobilized patient, Lasix diuresis, keep Foley catheter in place for diuresis monitoring, check the INR in a.m. to begin Coumadin therapy   LOS: 1 day    VAN TRIGT III,Ketrina Boateng 10/22/2011

## 2011-10-22 NOTE — Progress Notes (Signed)
Patient examined and record reviewed.Hemodynamics stable,labs satisfactory.Patient had stable day.Continue current care. VAN TRIGT III,Annalyssa Thune 10/22/2011

## 2011-10-23 ENCOUNTER — Inpatient Hospital Stay (HOSPITAL_COMMUNITY): Payer: Medicaid Other

## 2011-10-23 LAB — BASIC METABOLIC PANEL
BUN: 16 mg/dL (ref 6–23)
CO2: 25 mEq/L (ref 19–32)
Calcium: 9.2 mg/dL (ref 8.4–10.5)
Chloride: 100 mEq/L (ref 96–112)
Creatinine, Ser: 1.26 mg/dL — ABNORMAL HIGH (ref 0.50–1.10)
GFR calc Af Amer: 58 mL/min — ABNORMAL LOW (ref >90)
GFR calc non Af Amer: 50 mL/min — ABNORMAL LOW (ref >90)
Glucose, Bld: 122 mg/dL — ABNORMAL HIGH (ref 70–99)
Potassium: 3.6 mEq/L (ref 3.5–5.1)
Sodium: 135 mEq/L (ref 135–145)

## 2011-10-23 LAB — POCT I-STAT 3, ART BLOOD GAS (G3+)
Bicarbonate: 22.9 mEq/L (ref 20.0–24.0)
Bicarbonate: 24 mEq/L (ref 20.0–24.0)
TCO2: 24 mmol/L (ref 0–100)
pCO2 arterial: 41.7 mmHg (ref 35.0–45.0)
pCO2 arterial: 43.1 mmHg (ref 35.0–45.0)
pH, Arterial: 7.333 — ABNORMAL LOW (ref 7.350–7.400)
pH, Arterial: 7.369 (ref 7.350–7.400)
pO2, Arterial: 47 mmHg — ABNORMAL LOW (ref 80.0–100.0)

## 2011-10-23 LAB — CBC
HCT: 26.4 % — ABNORMAL LOW (ref 36.0–46.0)
Hemoglobin: 9.2 g/dL — ABNORMAL LOW (ref 12.0–15.0)
MCH: 34.7 pg — ABNORMAL HIGH (ref 26.0–34.0)
MCHC: 34.8 g/dL (ref 30.0–36.0)
MCV: 99.6 fL (ref 78.0–100.0)
Platelets: 187 10*3/uL (ref 150–400)
RBC: 2.65 MIL/uL — ABNORMAL LOW (ref 3.87–5.11)
RDW: 13.5 % (ref 11.5–15.5)
WBC: 13.1 10*3/uL — ABNORMAL HIGH (ref 4.0–10.5)

## 2011-10-23 LAB — GLUCOSE, CAPILLARY
Glucose-Capillary: 107 mg/dL — ABNORMAL HIGH (ref 70–99)
Glucose-Capillary: 107 mg/dL — ABNORMAL HIGH (ref 70–99)
Glucose-Capillary: 110 mg/dL — ABNORMAL HIGH (ref 70–99)
Glucose-Capillary: 121 mg/dL — ABNORMAL HIGH (ref 70–99)
Glucose-Capillary: 126 mg/dL — ABNORMAL HIGH (ref 70–99)

## 2011-10-23 LAB — PROTIME-INR
INR: 1.12 (ref 0.00–1.49)
Prothrombin Time: 14.6 seconds (ref 11.6–15.2)

## 2011-10-23 MED ORDER — WARFARIN SODIUM 5 MG PO TABS
5.0000 mg | ORAL_TABLET | Freq: Once | ORAL | Status: AC
  Administered 2011-10-23: 5 mg via ORAL
  Filled 2011-10-23: qty 1

## 2011-10-23 MED ORDER — WARFARIN SODIUM 5 MG IV SOLR: 5.0000 mg | Freq: Once | INTRAVENOUS | Status: DC

## 2011-10-23 MED ORDER — GUAIFENESIN ER 600 MG PO TB12
600.0000 mg | ORAL_TABLET | Freq: Two times a day (BID) | ORAL | Status: DC
  Administered 2011-10-23 (×2): 600 mg via ORAL
  Filled 2011-10-23 (×4): qty 1

## 2011-10-23 MED ORDER — POTASSIUM CHLORIDE 10 MEQ/50ML IV SOLN
10.0000 meq | INTRAVENOUS | Status: AC | PRN
  Administered 2011-10-23 – 2011-10-24 (×4): 10 meq via INTRAVENOUS
  Filled 2011-10-23 (×2): qty 150

## 2011-10-23 MED ORDER — FLUCONAZOLE 100 MG PO TABS
100.0000 mg | ORAL_TABLET | Freq: Every day | ORAL | Status: AC
  Administered 2011-10-23 – 2011-10-26 (×4): 100 mg via ORAL
  Filled 2011-10-23 (×5): qty 1

## 2011-10-23 MED ORDER — WARFARIN - PHYSICIAN DOSING INPATIENT
Freq: Every day | Status: DC
  Administered 2011-10-26: 18:00:00
  Filled 2011-10-23 (×5): qty 1

## 2011-10-23 MED ORDER — DEXTROSE 5 % IV SOLN
1.0000 g | Freq: Three times a day (TID) | INTRAVENOUS | Status: DC
  Administered 2011-10-23 – 2011-10-26 (×9): 1 g via INTRAVENOUS
  Filled 2011-10-23 (×12): qty 1

## 2011-10-23 MED ORDER — FUROSEMIDE 10 MG/ML IJ SOLN
40.0000 mg | Freq: Two times a day (BID) | INTRAMUSCULAR | Status: DC
  Administered 2011-10-23 – 2011-10-24 (×4): 40 mg via INTRAVENOUS
  Filled 2011-10-23 (×7): qty 4

## 2011-10-23 MED ORDER — LEVALBUTEROL HCL 1.25 MG/0.5ML IN NEBU
1.2500 mg | INHALATION_SOLUTION | Freq: Three times a day (TID) | RESPIRATORY_TRACT | Status: DC
  Administered 2011-10-23 – 2011-10-25 (×5): 1.25 mg via RESPIRATORY_TRACT
  Filled 2011-10-23 (×10): qty 0.5

## 2011-10-23 NOTE — Progress Notes (Signed)
Patient 02 sats dropped to 70's, placed on 100% NRB.  MD notified, orders received.  O2 sats again dropped to low 80's on NRB.  MD notified, patient to be placed on Bi-pap, Will continue to monitor.

## 2011-10-23 NOTE — Progress Notes (Deleted)
2 Days Post-Op Procedure(s) (LRB): MITRAL VALVE REPAIR (MVR) (N/A) CORONARY ARTERY BYPASS GRAFTING (CABG) (N/A)                    [address]            [name] [address]          [phone]     Subjective: Patient feels better and stronger today was able to ambulate in the hallway. Pain under control with home dose OxyContin, home dose Xanax, fentanyl PCA Chest x-ray with improved aeration right base, no air leak from chest tube, Pleur-evac to water seal today  Objective: Vital signs in last 24 hours: Temp:  [97.6 F (36.4 C)-99.2 F (37.3 C)] 98 F (36.7 C) (03/03 1223) Pulse Rate:  [65-102] 75  (03/03 1200) Cardiac Rhythm:  [-] Normal sinus rhythm (03/03 0800) Resp:  [9-25] 9  (03/03 1200) BP: (93-135)/(45-65) 104/58 mmHg (03/03 1200) SpO2:  [83 %-100 %] 90 % (03/03 1200) FiO2 (%):  [100 %] 100 % (03/03 0800) Weight:  [199 lb 1.2 oz (90.3 kg)] 199 lb 1.2 oz (90.3 kg) (03/03 0500)  Hemodynamic parameters for last 24 hours:   normal sinus rhythm  Intake/Output from previous day: 03/02 0701 - 03/03 0700 In: 1135 [P.O.:240; I.V.:539; IV Piggyback:356] Out: 3375 [Urine:2905; Chest Tube:470] Intake/Output this shift: Total I/O In: 320 [P.O.:240; I.V.:80] Out: 0   Exam lungs clear, no air leak and chest tube, neuro intact  Lab Results:  Basename 10/23/11 0400 10/22/11 1651 10/22/11 1645  WBC 13.1* -- 17.1*  HGB 9.2* 8.2* --  HCT 26.4* 24.0* --  PLT 187 -- 186   BMET:  Basename 10/23/11 0400 10/22/11 1651 10/22/11 0420  NA 135 136 --  K 3.6 4.3 --  CL 100 104 --  CO2 25 -- 20  GLUCOSE 122* 107* --  BUN 16 16 --  CREATININE 1.26* 1.60* --  CALCIUM 9.2 -- 8.0*    PT/INR:  Basename 10/23/11 0400  LABPROT 14.6  INR 1.12   ABG    Component Value Date/Time   PHART 7.369 10/23/2011 0235   HCO3 24.0 10/23/2011 0235   TCO2 25 10/23/2011 0235   ACIDBASEDEF 1.0 10/23/2011 0235   O2SAT 88.0 10/23/2011 0235   CBG (last 3)   Basename 10/23/11 1217  10/23/11 0704 10/23/11 0330  GLUCAP 113* 107* 121*    Assessment/Plan: S/P Procedure(s) (LRB): MITRAL VALVE REPAIR (MVR) (N/A) CORONARY ARTERY BYPASS GRAFTING (CABG) (N/A) Plan keep in ICU today, probably remove chest tube tomorrow pending chest x-ray. Continue PCA and current pain medication   LOS: 2 days    VAN TRIGT III,Maziah Smola 10/23/2011

## 2011-10-23 NOTE — Progress Notes (Signed)
P.m. SICU rounds   Maintaining sinus rhythm walking and using flutter valve and incentive spirometer. Oxygen saturation 90% on 6 L. Responded well to IV Lasix.  Breath sounds left base improved on exam. Left chest tube with minimal pleural drainage. Coumadin dose ordered this evening 5 mg by mouth

## 2011-10-23 NOTE — Progress Notes (Signed)
2 Days Post-Op Procedure(s) (LRB): MITRAL VALVE REPAIR (MVR) (N/A) CORONARY ARTERY BYPASS GRAFTING (CABG) (N/A)                    [address]            [name] [address]          [phone]      Subjective: Out of bed and chair this a.m., appears comfortable but she required BiPAP last night to maintain sats greater than 90%. Chest x-ray today shows atelectasis of left lower lobe, chest tube in place with minimal clear fluid drainage I performed NTS at bedside removing yellow mucous plug submitted for culture, Mucinex and Xopenex initiated, we'll broaden antibiotic coverage  Objective: Vital signs in last 24 hours: Temp:  [97.6 F (36.4 C)-99.2 F (37.3 C)] 98 F (36.7 C) (03/03 1223) Pulse Rate:  [65-102] 75  (03/03 1200) Cardiac Rhythm:  [-] Normal sinus rhythm (03/03 0800) Resp:  [9-25] 9  (03/03 1200) BP: (93-135)/(45-65) 104/58 mmHg (03/03 1200) SpO2:  [83 %-100 %] 90 % (03/03 1200) FiO2 (%):  [100 %] 100 % (03/03 0800) Weight:  [199 lb 1.2 oz (90.3 kg)] 199 lb 1.2 oz (90.3 kg) (03/03 0500)  Hemodynamic parameters for last 24 hours:   sinus rhythm  Intake/Output from previous day: 03/02 0701 - 03/03 0700 In: 1135 [P.O.:240; I.V.:539; IV Piggyback:356] Out: 3375 [Urine:2905; Chest Tube:470] Intake/Output this shift: Total I/O In: 320 [P.O.:240; I.V.:80] Out: 0   Exam-diminished breath sounds left base, neuro intact, abdomen soft  Lab Results:  Basename 10/23/11 0400 10/22/11 1651 10/22/11 1645  WBC 13.1* -- 17.1*  HGB 9.2* 8.2* --  HCT 26.4* 24.0* --  PLT 187 -- 186   BMET:  Basename 10/23/11 0400 10/22/11 1651 10/22/11 0420  NA 135 136 --  K 3.6 4.3 --  CL 100 104 --  CO2 25 -- 20  GLUCOSE 122* 107* --  BUN 16 16 --  CREATININE 1.26* 1.60* --  CALCIUM 9.2 -- 8.0*    PT/INR:  Basename 10/23/11 0400  LABPROT 14.6  INR 1.12   ABG    Component Value Date/Time   PHART 7.369 10/23/2011 0235   HCO3 24.0 10/23/2011 0235   TCO2 25  10/23/2011 0235   ACIDBASEDEF 1.0 10/23/2011 0235   O2SAT 88.0 10/23/2011 0235   CBG (last 3)   Basename 10/23/11 1217 10/23/11 0704 10/23/11 0330  GLUCAP 113* 107* 121*    Assessment/Plan: S/P Procedure(s) (LRB): MITRAL VALVE REPAIR (MVR) (N/A) CORONARY ARTERY BYPASS GRAFTING (CABG) (N/A) Plan-start oral Coumadin for mitral repair , sputum culture and cover with IV Elita Quick, continue when necessary NTS and BiPAP, diabetes under control   LOS: 2 days    VAN TRIGT III,Yazir Koerber 10/23/2011                    [address]            [place] [address]          [phone]

## 2011-10-23 NOTE — Progress Notes (Signed)
Alert, neuro intact ambulating with assistance taking PO solids and liquids well.  VS T- 36.7  BP 118/56 HR 75 (SR) RR- 21   O2 Sat 92% on 3L   K-3.6 Cr- 1.26 H/H 9.2/26 Plts-187,000  Extubated 4.5 hours post-op  Doing well, No apparent complications.Kipp Brood, MD

## 2011-10-24 ENCOUNTER — Inpatient Hospital Stay (HOSPITAL_COMMUNITY): Payer: Medicaid Other

## 2011-10-24 ENCOUNTER — Encounter (HOSPITAL_COMMUNITY): Payer: Self-pay | Admitting: Thoracic Surgery (Cardiothoracic Vascular Surgery)

## 2011-10-24 DIAGNOSIS — J9819 Other pulmonary collapse: Secondary | ICD-10-CM

## 2011-10-24 LAB — CBC
MCHC: 34.6 g/dL (ref 30.0–36.0)
MCV: 98.3 fL (ref 78.0–100.0)
Platelets: 188 10*3/uL (ref 150–400)
RDW: 13.3 % (ref 11.5–15.5)
WBC: 11.9 10*3/uL — ABNORMAL HIGH (ref 4.0–10.5)

## 2011-10-24 LAB — BASIC METABOLIC PANEL WITH GFR
BUN: 18 mg/dL (ref 6–23)
CO2: 28 meq/L (ref 19–32)
Calcium: 9 mg/dL (ref 8.4–10.5)
Chloride: 97 meq/L (ref 96–112)
Creatinine, Ser: 1.22 mg/dL — ABNORMAL HIGH (ref 0.50–1.10)
GFR calc Af Amer: 60 mL/min — ABNORMAL LOW
GFR calc non Af Amer: 52 mL/min — ABNORMAL LOW
Glucose, Bld: 99 mg/dL (ref 70–99)
Potassium: 3.5 meq/L (ref 3.5–5.1)
Sodium: 134 meq/L — ABNORMAL LOW (ref 135–145)

## 2011-10-24 LAB — GLUCOSE, CAPILLARY
Glucose-Capillary: 81 mg/dL (ref 70–99)
Glucose-Capillary: 71 mg/dL (ref 70–99)
Glucose-Capillary: 105 mg/dL — ABNORMAL HIGH (ref 70–99)

## 2011-10-24 LAB — PROTIME-INR
INR: 1.07 (ref 0.00–1.49)
Prothrombin Time: 14.1 seconds (ref 11.6–15.2)

## 2011-10-24 MED ORDER — ACETYLCYSTEINE 20 % IN SOLN
4.0000 mL | Freq: Once | RESPIRATORY_TRACT | Status: AC
  Administered 2011-10-24: 4 mL
  Filled 2011-10-24: qty 4

## 2011-10-24 MED ORDER — WARFARIN SODIUM 2.5 MG PO TABS
2.5000 mg | ORAL_TABLET | Freq: Every day | ORAL | Status: DC
  Administered 2011-10-24: 2.5 mg via ORAL
  Filled 2011-10-24 (×2): qty 1

## 2011-10-24 MED ORDER — LIDOCAINE VISCOUS 2 % MT SOLN
20.0000 mL | Freq: Once | OROMUCOSAL | Status: AC
  Administered 2011-10-24: 20 mL via OROMUCOSAL
  Filled 2011-10-24: qty 20

## 2011-10-24 MED ORDER — GUAIFENESIN ER 600 MG PO TB12
600.0000 mg | ORAL_TABLET | Freq: Two times a day (BID) | ORAL | Status: DC
  Administered 2011-10-24 – 2011-10-29 (×11): 600 mg via ORAL
  Filled 2011-10-24 (×12): qty 1

## 2011-10-24 MED ORDER — TRAMADOL HCL 50 MG PO TABS: 50.0000 mg | ORAL_TABLET | Freq: Four times a day (QID) | ORAL | Status: DC | PRN

## 2011-10-24 MED ORDER — BENZOCAINE 20 % MT SOLN
Freq: Once | OROMUCOSAL | Status: AC
  Administered 2011-10-24: 2 via OROMUCOSAL
  Filled 2011-10-24: qty 57

## 2011-10-24 MED ORDER — LIDOCAINE HCL (PF) 1 % IJ SOLN
INTRAMUSCULAR | Status: AC
  Administered 2011-10-24: 5 mL via TOPICAL
  Filled 2011-10-24: qty 5

## 2011-10-24 MED FILL — Potassium Chloride Inj 2 mEq/ML: INTRAVENOUS | Qty: 40 | Status: AC

## 2011-10-24 MED FILL — Lactated Ringer's Solution: INTRAVENOUS | Qty: 500 | Status: AC

## 2011-10-24 MED FILL — Verapamil HCl IV Soln 2.5 MG/ML: INTRAVENOUS | Qty: 4 | Status: AC

## 2011-10-24 MED FILL — Heparin Sodium (Porcine) Inj 1000 Unit/ML: INTRAMUSCULAR | Qty: 10 | Status: AC

## 2011-10-24 MED FILL — Magnesium Sulfate Inj 50%: INTRAMUSCULAR | Qty: 10 | Status: AC

## 2011-10-24 MED FILL — Nitroglycerin IV Soln 5 MG/ML: INTRAVENOUS | Qty: 10 | Status: AC

## 2011-10-24 NOTE — Progress Notes (Signed)
   CARDIOTHORACIC SURGERY PROGRESS NOTE   R3 Days Post-Op Procedure(s) (LRB): MITRAL VALVE REPAIR (MVR) (N/A) CORONARY ARTERY BYPASS GRAFTING (CABG) (N/A)  Subjective: Feels well except reports coughing all night.  Cough non-productive.  Objective: Vital signs: BP Readings from Last 1 Encounters:  10/24/11 116/54   Pulse Readings from Last 1 Encounters:  10/24/11 78   Resp Readings from Last 1 Encounters:  10/24/11 17   Temp Readings from Last 1 Encounters:  10/24/11 98.6 F (37 C) Oral    Hemodynamics:    Physical Exam:  Rhythm:   sinus  Breath sounds: Severely diminished left side  Heart sounds:  RRR no murmur  Incisions:  Clean and dry  Abdomen:  soft  Extremities:  Warm, well perfused   Intake/Output from previous day: 03/03 0701 - 03/04 0700 In: 1034 [P.O.:720; I.V.:160; IV Piggyback:154] Out: 2090 [Urine:1830; Chest Tube:260] Intake/Output this shift:    Lab Results:  Basename 10/23/11 0400 10/22/11 1651 10/22/11 1645  WBC 13.1* -- 17.1*  HGB 9.2* 8.2* --  HCT 26.4* 24.0* --  PLT 187 -- 186   BMET:  Basename 10/23/11 0400 10/22/11 1651 10/22/11 0420  NA 135 136 --  K 3.6 4.3 --  CL 100 104 --  CO2 25 -- 20  GLUCOSE 122* 107* --  BUN 16 16 --  CREATININE 1.26* 1.60* --  CALCIUM 9.2 -- 8.0*    CBG (last 3)   Basename 10/24/11 0343 10/23/11 2333 10/23/11 1917  GLUCAP 81 123* 107*   ABG    Component Value Date/Time   PHART 7.369 10/23/2011 0235   HCO3 24.0 10/23/2011 0235   TCO2 25 10/23/2011 0235   ACIDBASEDEF 1.0 10/23/2011 0235   O2SAT 88.0 10/23/2011 0235   CXR: Complete opacification of left lung consistent with mucous plug and atelectasis  Assessment/Plan: S/P Procedure(s) (LRB): MITRAL VALVE REPAIR (MVR) (N/A) CORONARY ARTERY BYPASS GRAFTING (CABG) (N/A)  Stable hemodynamics POD3 Severe post-op mucous plugging with left lung atelectasis Expected post op acute blood loss anemia, mild, stable Expected post op volume excess, mild,  diuresing   Will perform bronchoscopy at bedside for pulm toilet  Mobilize  Keep left pleural chest tube until left lung aeration improved  Abbie Jablon H 10/24/2011 7:42 AM

## 2011-10-24 NOTE — Progress Notes (Signed)
Patient ID: Ashley [name], female   DOB: November 27, 1965, 46 y.o.   MRN: [id number] Bronch earlier today BP 107/55  Pulse 77  Temp(Src) 99 F (37.2 C) (Oral)  Resp 17  Ht 5' 6.93" (1.7 m)  Wt 193 lb 5.5 oz (87.7 kg)  BMI 30.35 kg/m2  SpO2 97%  LMP 10/10/2011 Down to 6 L [place] with good sats

## 2011-10-24 NOTE — Progress Notes (Signed)
   CARE MANAGEMENT NOTE 10/24/2011  Patient:  Ashley [name], Ashley [name]   Account Number:  [id number]  Date Initiated:  10/21/2011  Documentation initiated by:  [facility]  Subjective/Objective Assessment:   CABG x4 - has spouse.     Action/Plan:   PTA, PT INDEPENDENT, LIVES WITH SPOUSE.   Anticipated DC Date:  10/26/2011   Anticipated DC Plan:  HOME W HOME HEALTH SERVICES      DC Planning Services  CM consult      Choice offered to / List presented to:             Status of service:  In process, will continue to follow Medicare Important Message given?   (If response is "NO", the following Medicare IM given date fields will be blank) Date Medicare IM given:   Date Additional Medicare IM given:    Discharge Disposition:    Per UR Regulation:  Reviewed for med. necessity/level of care/duration of stay  Comments:  10/24/11 Emmilynn Marut,RN,BSN 1400 MET WITH PT TO DISCUSS DC PLANS.  PT STATES SPOUSE, SISTER AND CHILDREN TO PROVIDE 24HR CARE AT DISCHARGE.  WILL FOLLOW FOR HOME NEEDS AS PT PROGRESSES. Jerrell Belfast, RN, BSN Phone #[phone]

## 2011-10-24 NOTE — Op Note (Signed)
CARDIOTHORACIC SURGERY OPERATIVE NOTE  Date of Procedure: 10/24/2011  Preoperative Diagnosis: Left Lung Atelectasis Postoperative Diagnosis: Same  Procedure: Flexible Fiberoptic Bronchoscopy with Endobronchial Lavage  Surgeon: Salvatore Decent. Cornelius Moras, MD  Anesthesia: 1% lidocaine topical   DETAILS OF THE OPERATIVE PROCEDURE  Following full informed consent the patient was anesthetized topically with viscous lidocaine to the right nare and pharynx and preoxygenated with 100% O2 by facemask.  A flexible fiberoptic bronchoscope was advanced under direct vision through the right nare and pharynx.  The hypopharynx and vocal cords were sprayed with 1% lidocaine solution.  The bronchoscope was advanced through the cords and into the trachea.  There was copious creamy secretions obstructing the left mainstem bronchus.  The left mainstem was irrigated with saline solution and lavage specimen trapped for culture.  The entire procedure was repeated and some improvement in aeration appreciated on visual inspection.  The bronchoscope was removed uneventfully.   Salvatore Decent. Cornelius Moras, MD

## 2011-10-24 NOTE — Anesthesia Postprocedure Evaluation (Signed)
  Anesthesia Post-op Note  Patient: Ashley [name]  Procedure(s) Performed: Procedure(s) (LRB): MITRAL VALVE REPAIR (MVR) (N/A) CORONARY ARTERY BYPASS GRAFTING (CABG) (N/A)  Patient Location: PACU and ICU  Anesthesia Type: General  Level of Consciousness: awake, alert  and oriented  Airway and Oxygen Therapy: Patient Spontanous Breathing and Patient connected to nasal cannula oxygen  Post-op Pain: mild  Post-op Assessment: Post-op Vital signs reviewed, Patient's Cardiovascular Status Stable, Respiratory Function Stable, Patent Airway, No signs of Nausea or vomiting and Adequate PO intake  Post-op Vital Signs: Reviewed and stable  Complications: No apparent anesthesia complications

## 2011-10-25 ENCOUNTER — Inpatient Hospital Stay (HOSPITAL_COMMUNITY): Payer: Medicaid Other

## 2011-10-25 DIAGNOSIS — R918 Other nonspecific abnormal finding of lung field: Secondary | ICD-10-CM

## 2011-10-25 DIAGNOSIS — R093 Abnormal sputum: Secondary | ICD-10-CM

## 2011-10-25 DIAGNOSIS — R0902 Hypoxemia: Secondary | ICD-10-CM

## 2011-10-25 DIAGNOSIS — J209 Acute bronchitis, unspecified: Secondary | ICD-10-CM

## 2011-10-25 LAB — CULTURE, RESPIRATORY W GRAM STAIN: Culture: NORMAL

## 2011-10-25 LAB — GLUCOSE, CAPILLARY
Glucose-Capillary: 127 mg/dL — ABNORMAL HIGH (ref 70–99)
Glucose-Capillary: 71 mg/dL (ref 70–99)
Glucose-Capillary: 79 mg/dL (ref 70–99)
Glucose-Capillary: 84 mg/dL (ref 70–99)
Glucose-Capillary: 85 mg/dL (ref 70–99)

## 2011-10-25 LAB — BASIC METABOLIC PANEL
Chloride: 94 mEq/L — ABNORMAL LOW (ref 96–112)
GFR calc Af Amer: 60 mL/min — ABNORMAL LOW (ref >90)
Potassium: 3.7 mEq/L (ref 3.5–5.1)
Sodium: 132 mEq/L — ABNORMAL LOW (ref 135–145)

## 2011-10-25 LAB — PROTIME-INR
INR: 1.09 (ref 0.00–1.49)
Prothrombin Time: 14.3 seconds (ref 11.6–15.2)

## 2011-10-25 LAB — URINALYSIS, ROUTINE W REFLEX MICROSCOPIC
Bilirubin Urine: NEGATIVE
Glucose, UA: NEGATIVE mg/dL
Hgb urine dipstick: NEGATIVE
Nitrite: NEGATIVE
Specific Gravity, Urine: 1.023 (ref 1.005–1.030)
pH: 6 (ref 5.0–8.0)

## 2011-10-25 LAB — TYPE AND SCREEN
Antibody Screen: NEGATIVE
Unit division: 0
Unit division: 0

## 2011-10-25 LAB — CBC
Hemoglobin: 7.9 g/dL — ABNORMAL LOW (ref 12.0–15.0)
MCH: 34.1 pg — ABNORMAL HIGH (ref 26.0–34.0)
MCHC: 34.3 g/dL (ref 30.0–36.0)
Platelets: 202 10*3/uL (ref 150–400)
Platelets: 233 10*3/uL (ref 150–400)
RDW: 13.1 % (ref 11.5–15.5)
WBC: 9 10*3/uL (ref 4.0–10.5)

## 2011-10-25 LAB — URINE MICROSCOPIC-ADD ON

## 2011-10-25 MED ORDER — LIDOCAINE VISCOUS 2 % MT SOLN: 20.0000 mL | Freq: Once | OROMUCOSAL | Status: DC

## 2011-10-25 MED ORDER — POTASSIUM CHLORIDE CRYS ER 20 MEQ PO TBCR
20.0000 meq | EXTENDED_RELEASE_TABLET | Freq: Two times a day (BID) | ORAL | Status: DC
  Administered 2011-10-26 (×2): 20 meq via ORAL
  Filled 2011-10-25 (×5): qty 1

## 2011-10-25 MED ORDER — LIDOCAINE HCL (PF) 1 % IJ SOLN
INTRAMUSCULAR | Status: AC
  Administered 2011-10-25: 14:00:00
  Filled 2011-10-25: qty 10

## 2011-10-25 MED ORDER — WARFARIN SODIUM 2.5 MG PO TABS
2.5000 mg | ORAL_TABLET | Freq: Every day | ORAL | Status: DC
  Administered 2011-10-26: 2.5 mg via ORAL
  Filled 2011-10-25 (×2): qty 1

## 2011-10-25 MED ORDER — NICOTINE 14 MG/24HR TD PT24
14.0000 mg | MEDICATED_PATCH | TRANSDERMAL | Status: DC
  Administered 2011-10-25 – 2011-10-28 (×4): 14 mg via TRANSDERMAL
  Filled 2011-10-25 (×6): qty 1

## 2011-10-25 MED ORDER — ALBUTEROL SULFATE (5 MG/ML) 0.5% IN NEBU
2.5000 mg | INHALATION_SOLUTION | RESPIRATORY_TRACT | Status: DC | PRN
  Administered 2011-10-26 – 2011-10-27 (×2): 2.5 mg via RESPIRATORY_TRACT
  Filled 2011-10-25 (×2): qty 0.5

## 2011-10-25 MED ORDER — SODIUM CHLORIDE 3 % IN NEBU
15.0000 mL | INHALATION_SOLUTION | RESPIRATORY_TRACT | Status: DC
  Filled 2011-10-25 (×5): qty 15

## 2011-10-25 MED ORDER — IPRATROPIUM BROMIDE 0.02 % IN SOLN
0.5000 mg | RESPIRATORY_TRACT | Status: DC | PRN
  Administered 2011-10-26 – 2011-10-27 (×2): 0.5 mg via RESPIRATORY_TRACT
  Filled 2011-10-25 (×2): qty 2.5

## 2011-10-25 MED ORDER — IOHEXOL 300 MG/ML  SOLN: 60.0000 mL | Freq: Once | INTRAMUSCULAR | Status: AC | PRN

## 2011-10-25 MED ORDER — LEVALBUTEROL HCL 1.25 MG/0.5ML IN NEBU
1.2500 mg | INHALATION_SOLUTION | RESPIRATORY_TRACT | Status: DC
  Administered 2011-10-25 – 2011-10-26 (×3): 1.25 mg via RESPIRATORY_TRACT
  Filled 2011-10-25 (×9): qty 0.5

## 2011-10-25 MED ORDER — IPRATROPIUM BROMIDE 0.02 % IN SOLN: 0.5000 mg | RESPIRATORY_TRACT | Status: DC

## 2011-10-25 MED ORDER — ALBUTEROL SULFATE (5 MG/ML) 0.5% IN NEBU: 2.5000 mg | INHALATION_SOLUTION | RESPIRATORY_TRACT | Status: DC

## 2011-10-25 MED ORDER — FENTANYL CITRATE 0.05 MG/ML IJ SOLN
INTRAMUSCULAR | Status: AC
  Administered 2011-10-25: 150 ug
  Filled 2011-10-25: qty 4

## 2011-10-25 MED ORDER — MIDAZOLAM HCL 2 MG/2ML IJ SOLN
INTRAMUSCULAR | Status: AC
  Administered 2011-10-25: 2.5 mg
  Filled 2011-10-25: qty 4

## 2011-10-25 MED ORDER — ACETYLCYSTEINE 20 % IN SOLN
4.0000 mL | Freq: Once | RESPIRATORY_TRACT | Status: DC
  Filled 2011-10-25 (×2): qty 4

## 2011-10-25 MED ORDER — POTASSIUM CHLORIDE 10 MEQ/50ML IV SOLN
10.0000 meq | INTRAVENOUS | Status: DC | PRN
  Administered 2011-10-25 – 2011-10-26 (×2): 10 meq via INTRAVENOUS

## 2011-10-25 MED ORDER — POTASSIUM CHLORIDE 10 MEQ/50ML IV SOLN
INTRAVENOUS | Status: AC
  Administered 2011-10-25: 10 meq via INTRAVENOUS
  Filled 2011-10-25: qty 50

## 2011-10-25 MED ORDER — LIDOCAINE VISCOUS 2 % MT SOLN
20.0000 mL | Freq: Once | OROMUCOSAL | Status: AC
  Administered 2011-10-25: 20 mL via OROMUCOSAL
  Filled 2011-10-25: qty 20

## 2011-10-25 MED ORDER — VANCOMYCIN HCL 1000 MG IV SOLR
1250.0000 mg | INTRAVENOUS | Status: DC
  Administered 2011-10-25: 1250 mg via INTRAVENOUS
  Filled 2011-10-25 (×2): qty 1250

## 2011-10-25 MED ORDER — POTASSIUM CHLORIDE 10 MEQ/50ML IV SOLN
INTRAVENOUS | Status: AC
  Administered 2011-10-25: 10 meq
  Filled 2011-10-25: qty 50

## 2011-10-25 MED ORDER — POTASSIUM CHLORIDE 10 MEQ/50ML IV SOLN
10.0000 meq | INTRAVENOUS | Status: AC
  Administered 2011-10-25: 10 meq via INTRAVENOUS
  Filled 2011-10-25: qty 50

## 2011-10-25 MED FILL — Sodium Chloride IV Soln 0.9%: INTRAVENOUS | Qty: 1000 | Status: AC

## 2011-10-25 MED FILL — Mannitol IV Soln 20%: INTRAVENOUS | Qty: 500 | Status: AC

## 2011-10-25 MED FILL — Heparin Sodium (Porcine) Inj 1000 Unit/ML: INTRAMUSCULAR | Qty: 20 | Status: AC

## 2011-10-25 MED FILL — Sodium Chloride Irrigation Soln 0.9%: Qty: 3000 | Status: AC

## 2011-10-25 MED FILL — Lidocaine HCl IV Inj 20 MG/ML: INTRAVENOUS | Qty: 5 | Status: AC

## 2011-10-25 MED FILL — Sodium Bicarbonate IV Soln 8.4%: INTRAVENOUS | Qty: 50 | Status: AC

## 2011-10-25 MED FILL — Electrolyte-R (PH 7.4) Solution: INTRAVENOUS | Qty: 4000 | Status: AC

## 2011-10-25 MED FILL — Heparin Sodium (Porcine) Inj 1000 Unit/ML: INTRAMUSCULAR | Qty: 30 | Status: AC

## 2011-10-25 NOTE — Progress Notes (Signed)
ANTIBIOTIC CONSULT NOTE - INITIAL  Pharmacy Consult for Vancomycin Indication: rule out pneumonia  Allergies  Allergen Reactions  . Azithromycin Nausea Only    Patient Measurements: Height: 5' 6.93" (170 cm) Weight: 189 lb 9.5 oz (86 kg) IBW/kg (Calculated) : 61.44   Vital Signs: Temp: 98.5 F (36.9 C) (03/05 0759) Temp src: Oral (03/05 0759) BP: 124/64 mmHg (03/05 0900) Pulse Rate: 82  (03/05 0900) Intake/Output from previous day: 03/04 0701 - 03/05 0700 In: 972 [P.O.:180; I.V.:430; IV Piggyback:362] Out: 3370 [Urine:3020; Chest Tube:350] Intake/Output from this shift: Total I/O In: -  Out: 55 [Urine:35; Chest Tube:20]  Labs:  [facility] 10/25/11 0810 10/25/11 0345 10/24/11 0800 10/23/11 0400  WBC 10.0 9.0 11.9* --  HGB 7.9* 7.5* 8.1* --  PLT 233 202 188 --  LABCREA -- -- -- --  CREATININE -- 1.22* 1.22* 1.26*   Estimated Creatinine Clearance: 64.1 ml/min (by C-G formula based on Cr of 1.22). No results found for this basename: VANCOTROUGH:2,VANCOPEAK:2,VANCORANDOM:2,GENTTROUGH:2,GENTPEAK:2,GENTRANDOM:2,TOBRATROUGH:2,TOBRAPEAK:2,TOBRARND:2,AMIKACINPEAK:2,AMIKACINTROU:2,AMIKACIN:2, in the last 72 hours   Microbiology: Recent Results (from the past 720 hour(s))  SURGICAL PCR SCREEN     Status: Normal   Collection Time   10/10/11  1:38 PM      Component Value Range Status Comment   MRSA, PCR NEGATIVE  NEGATIVE  Final    Staphylococcus aureus NEGATIVE  NEGATIVE  Final   CULTURE, RESPIRATORY     Status: Normal (Preliminary result)   Collection Time   10/23/11 11:38 AM      Component Value Range Status Comment   Specimen Description OTHER   Final    Special Requests NURSE STATED FROM NTS (NASAL)   Final    Gram Stain     Final    Value: ABUNDANT WBC PRESENT, PREDOMINANTLY PMN     MODERATE SQUAMOUS EPITHELIAL CELLS PRESENT     FEW GRAM POSITIVE COCCI     IN PAIRS IN CHAINS   Culture Culture reincubated for better growth   Final    Report Status PENDING   Incomplete      Medical History: Past Medical History  Diagnosis Date  . Abdominal pain     ? chronic cholecystitis; admxn to [facility] 9/12 (CT, USN, HIDA done)  . Stroke     in setting of cigs and OCPs  . Cardiomyopathy     echo 9/12: mild LVH, EF 35-40%, mod MR (difficult to judge /post leaflet restricted), mod BAE, mod RVE, mod TR  . HTN (hypertension)   . Tobacco abuse   . Heart murmur   . Coronary artery disease   . Mitral regurgitation   . Recurrent upper respiratory infection (URI)     COMPLETED 10 DAY COARSE  ABX .COMPLETED 10/09/11  . 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 acetaminophen  1,000 mg Oral Q6H  . acetylcysteine  4 mL Per Tube Once  . amiodarone  200 mg Oral BID  . aspirin EC  325 mg Oral Daily  . benzocaine   Mouth/Throat Once  . bisacodyl  10 mg Oral Daily   Or  . bisacodyl  10 mg Rectal Daily  . cefTAZidime (FORTAZ)  IV  1 g Intravenous Q8H  . docusate sodium  200 mg Oral Daily  . fluconazole  100 mg Oral Daily  . furosemide  40 mg Intravenous BID  . guaiFENesin  600 mg Oral BID  . insulin aspart  0-24 Units  Subcutaneous Q4H  . insulin glargine  18 Units Subcutaneous Daily  . insulin regular  0-10 Units Intravenous TID WC  . levalbuterol  1.25 mg Nebulization TID  . lidocaine      . lidocaine  20 mL Mouth/Throat Once  . metoprolol tartrate  12.5 mg Oral BID  . nicotine  14 mg Transdermal Q24H  . pantoprazole  40 mg Oral Q1200  . potassium chloride  10 mEq Intravenous Q1 Hr x 3  . potassium chloride      . potassium chloride  20 mEq Oral BID  . simvastatin  20 mg Oral QPM  . sodium bicarbonate  50 mEq Intravenous Once  . sodium chloride  3 mL Intravenous Q12H  . warfarin  2.5 mg Oral q1800  . Warfarin - Physician Dosing Inpatient   Does not apply q1800  . DISCONTD: guaiFENesin  600 mg Oral BID  . DISCONTD: metoprolol tartrate  12.5 mg Per Tube BID  . DISCONTD: nicotine  21 mg Transdermal Q24H    Assessment: 46 y/o female patient s/p MVR with CABG requiring broad spectrum antibiotics for r/o PNA. Currently receiving Fortaz/Diflucan, now to add Vancomycin, noted elevated scr, will adjust frequency.  Sputum cx gram stain gpcp.  Goal of Therapy:  Vancomycin trough level 15-20 mcg/ml  Plan:  Vancomycin 1250mg  IV Q24h, monitor renal fxn and f/u c&s. Measure antibiotic drug levels at steady [address], PharmD, [place] Pager [phone] 10/25/2011,9:02 AM

## 2011-10-25 NOTE — Consult Note (Signed)
Name: Ashley [name] MRN: [id number] DOB: 12-30-1965    LOS: 4 Requesting MD: Cornelius Moras  PCCM CONSULT NOTE  History of Present Illness:46 yo wf with known MVP since age 46 following a stroke. 1 1/2 ppd smoker since age 14, reportedly quit Oct. 2012 ,on nicotine patch since. She underwent MV repair along with CABG x 4 on 10/21/11 and was extubated 4 hours post op. Since that time she required FOB per Dr. Cornelius Moras for mucus plugging Left lung 3/4.  CxR 3/5 with slight improvement but still with large unaerated section of Left lung.  PCCM asked to assist with pulmonary issues. Note she has bronchitis prior to surgery, and was treated with antibiotics by PCP.  She says that she stopped using cough medicine three days prior to surgery but she is unclear if she had complete resolution of symptoms.  Lines / Drains: 3/1 rt i j cvl>> 3/1 lt ct>> 3/1 ott>>3/1  Cultures: 3/4 sputum>>  Antibiotics: 3/3 fortaz>> 3/5 vanc>> 3/3 diflucan>>  Tests / Events: 3/1 cabg/MVR 3/4 fob  Subjective: Awake and alert Past Medical History  Diagnosis Date  . Abdominal pain     ? chronic cholecystitis; admxn to [facility] 9/12 (CT, USN, HIDA done)  . Stroke     in setting of cigs and OCPs  . Cardiomyopathy     echo 9/12: mild LVH, EF 35-40%, mod MR (difficult to judge /post leaflet restricted), mod BAE, mod RVE, mod TR  . HTN (hypertension)   . Tobacco abuse   . Heart murmur   . Coronary artery disease   . Mitral regurgitation   . Recurrent upper respiratory infection (URI)     COMPLETED 10 DAY COARSE  ABX .COMPLETED 10/09/11  . 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 Tonsillectomy   . Tubal ligation   . Cesarean section   . Cesarean section   . Tee without cardioversion 06/30/2011    Procedure: TRANSESOPHAGEAL ECHOCARDIOGRAM (TEE);  Surgeon: Pricilla Riffle, MD;  Location: [facility] ENDOSCOPY;  Service: Cardiovascular;  Laterality: N/A;  . Mitral valve repair  10/21/2011    Procedure: MITRAL VALVE REPAIR (MVR);  Surgeon: Purcell Nails, MD;  Location: [facility] OR;  Service: Open Heart Surgery;  Laterality: N/A;  . Coronary artery bypass graft 10/21/2011    Procedure: CORONARY ARTERY BYPASS GRAFTING (CABG);  Surgeon: Purcell Nails, MD;  Location: [facility] OR;  Service: Open Heart Surgery;  Laterality: N/A;  cabg x four, using right leg greater saphenous vein harvested endoscopically   Prior to Admission medications   Medication Sig Start Date End Date Taking? Authorizing Provider  amiodarone (PACERONE) 200 MG tablet Take 200 mg by mouth 2 (two) times daily. Begin 7 days prior to surgery. 10/13/11 10/21/12 Yes Purcell Nails, MD  amLODipine (NORVASC) 10 MG tablet Take 1 tablet (10 mg total) by mouth daily. 06/20/11 06/19/12 Yes Pricilla Riffle, MD  aspirin EC 81 MG tablet Take 1 tablet (81 mg total) by mouth daily. 05/23/11  Yes Beatrice Lecher, PA  carvedilol (COREG) 12.5 MG tablet Take 12.5 mg by mouth 2 (two) times daily with a meal.   Yes Historical Provider, MD  famotidine (PEPCID) 20 MG tablet Take 20 mg by mouth daily as needed. For indigestion   Yes Historical Provider, MD  hydrochlorothiazide (MICROZIDE) 12.5 MG capsule Take 1 capsule (12.5 mg total) by mouth daily. 08/17/11  Yes Pricilla Riffle,  MD  lisinopril (PRINIVIL,ZESTRIL) 20 MG tablet Take 20 mg by mouth 2 (two) times daily.   Yes Historical Provider, MD  nicotine (NICODERM CQ - DOSED IN MG/24 HOURS) 21 mg/24hr patch Place 1 patch onto the skin daily.    Yes Historical Provider, MD  simvastatin (ZOCOR) 20 MG tablet Take 1 tablet (20 mg total) by mouth every evening. 07/18/11 07/17/12 Yes Pricilla Riffle, MD   Allergies Allergies  Allergen Reactions  . Azithromycin Nausea Only    Family History Family History  Problem Relation Age of Onset  . Stroke Neg Hx   . Anesthesia problems Neg Hx   . Hypotension Neg Hx   . Malignant hyperthermia Neg Hx   . Pseudochol deficiency Neg Hx   . Hypertension Mother   .  Heart disease Father   . COPD Father     Social History  reports that she quit smoking about 5 months ago. She quit smokeless tobacco use about 4 months ago. She reports that she does not drink alcohol or use illicit drugs.  Review Of Systems  11 points review of systems is negative with an exception of listed in HPI.  Vital Signs: Temp:  [97.6 F (36.4 C)-99 F (37.2 C)] 98.1 F (36.7 C) (03/05 0300) Pulse Rate:  [69-90] 77  (03/05 0700) Resp:  [12-26] 14  (03/05 0700) BP: (90-143)/(50-105) 106/51 mmHg (03/05 0700) SpO2:  [85 %-98 %] 96 % (03/05 0700) FiO2 (%):  [50 %-100 %] 50 % (03/04 1453) Weight:  [189 lb 9.5 oz (86 kg)] 189 lb 9.5 oz (86 kg) (03/05 0600) I/O last 3 completed shifts: In: 1072 [P.O.:180; I.V.:430; IV Piggyback:462] Out: 4080 [Urine:3600; Chest Tube:480]  Physical Examination: General:  Thin WF NAD at rest Neuro: A&Ox4, MAEW HEENT:  Rt i j cvl. Edentulous.  Cardiovascular:  HSR RRR Chest: midline scar without erythema, edema, or drainage Lungs: decreased lt base, otherwise clear on right. Lt ct with no air leak. Abdomen: non tender + bs Musculoskeletal:  intact Skin:  warm  Ventilator settings: Vent Mode:  [-]  FiO2 (%):  [50 %-100 %] 50 %  Labs and Imaging:  Dg Chest Port 1 View  10/24/2011  *RADIOLOGY REPORT*  Clinical Data: Status post bronchoscopy  PORTABLE CHEST - 1 VIEW  Comparison: 10/24/2011 at 0651 hours  Findings: Near complete opacification of the left hemithorax, unchanged. Stable left chest tube.  No definite pneumothorax is seen.  Right lung is essentially clear.  Postsurgical changes related to prior CABG.  Valve annuloplasty.  Stable right IJ venous catheter.  IMPRESSION: Stable left chest tube.  No definite pneumothorax is seen.  Near complete opacification of the left hemithorax, unchanged.  Original Report Authenticated By: Charline Bills, M.D.   Dg Chest Port 1 View  10/24/2011  *RADIOLOGY REPORT*  Clinical Data: CABG, chest tubes.   PORTABLE CHEST - 1 VIEW  Comparison: 10/23/2011  Findings: Changes of CABG and valve replacement.  Right central line and left chest tube remain in place, unchanged.  Enlarging left effusion and worsening left lung airspace disease.  Near complete opacification of the left hemithorax currently.  Right base atelectasis.  Cannot completely exclude small left apical pneumothorax.  IMPRESSION: Worsening aeration with near complete opacification of the left hemithorax, likely a combination of airspace disease and effusion. Question small left apical pneumothorax.  Original Report Authenticated By: Cyndie Chime, M.D.    Lab 10/25/11 0345 10/24/11 0800 10/23/11 0400  NA 132* 134* 135  K 3.7  3.5 3.6  CL 94* 97 100  CO2 29 28 25   BUN 17 18 16   CREATININE 1.22* 1.22* 1.26*  GLUCOSE 94 99 122*    Lab 10/25/11 0345 10/24/11 0800 10/23/11 0400  HGB 7.5* 8.1* 9.2*  HCT 22.0* 23.4* 26.4*  WBC 9.0 11.9* 13.1*  PLT 202 188 187   ABG    Component Value Date/Time   PHART 7.369 10/23/2011 0235   PCO2ART 41.7 10/23/2011 0235   PO2ART 57.0* 10/23/2011 0235   HCO3 24.0 10/23/2011 0235   TCO2 25 10/23/2011 0235   ACIDBASEDEF 1.0 10/23/2011 0235   O2SAT 88.0 10/23/2011 0235    Assessment and Plan: 46 y/o female with left lung collapse following MVR?CABG  10/21/11. Note prior bronchitis pre admit tx with abx(amoxicillin). Despite aggressive pulmonary toilet and FOB 3/4 Lt lung remains collapsed.  Suspect ongoing mucus plugging related to preceding bronchitis and possibly underlying COPD.  A less likely consideration would be extrinsic compression or an airway lesion causing the left lung obstruction.  -Continue pulmonary toilet  -flutter valve q1 hour -IPPB for xopenex and inhaled nebulized hypertonic saline -continue guaifenesin -No vibravest in fresh sternotomy -will bronch AM of 3/6 with videobronchoscope (better view) and conscious sedation (per patient request) -agree with current antibiotic regimen -Check  procalcitonin -Agree with CT chest for evaluation -Mobilize -May need intubation for continuous pul toilet if other procedures fail. -dc lasix today as can cause thick secretions and her bicarb appears to be rising -npo after midnight -hold warfarin today, resume tomorrow  Post MVR/CABG -per cvts  Hx of stroke -monitor - Best practices / Disposition: -->ICU status under CVTS -->full code -->Heparin for DVT Px -->Protonix for GI Px -->diet , eating   Brett Canales Minor ACNP Adolph Pollack PCCM Pager [phone] till 3 pm If no answer page [phone] 10/25/2011, 7:56 AM  I have seen and examined the patient with nurse practitioner/resident and agree with and have modified the note above.   Yolonda Kida PCCM Pager: [phone] If no response, call [phone]

## 2011-10-25 NOTE — Progress Notes (Signed)
Dr. Margo Aye called regarding CT results.  Stated concern about a suspicion of a hemothorax and wanted surgeon notified.  Dr. Cornelius Moras called and stated he would examine results.  Dr. Margo Aye left his direct number [phone] for MD to call.

## 2011-10-25 NOTE — Progress Notes (Signed)
Patient in a stable sinus rhythm Left-sided breath sounds significant we improved this evening after earlier bronchoscopy Continue current pulmonary toilet and meds

## 2011-10-25 NOTE — Progress Notes (Signed)
TCTS BRIEF SICU PROGRESS NOTE  4 Days Post-Op  S/P Procedure(s) (LRB): MITRAL VALVE REPAIR (MVR) (N/A) CORONARY ARTERY BYPASS GRAFTING (CABG) (N/A)   Chest CT scan reviewed.  The scan demonstrates persistent nearly complete collapse of the left lung with fairly dense consolidation of the left lower lobe.  The left lower lobe has been atelectatic essentially ever since surgery. There is a very small (trivial) amount of dense fluid/material in the pleural space that likely represents old clot from surgery.  There remains trivial output from the left pleural tube which is draining serous fluid.  Plan: Repeat bronchoscopy as soon as practical.  Continue pulmonary toilet, nebs, IPPB treatments.  Continue empiric antibiotics for possible pneumonia.  Sameka Bagent H 10/25/2011 12:55 PM

## 2011-10-25 NOTE — Progress Notes (Signed)
   CARDIOTHORACIC SURGERY PROGRESS NOTE   R4 Days Post-Op Procedure(s) (LRB): MITRAL VALVE REPAIR (MVR) (N/A) CORONARY ARTERY BYPASS GRAFTING (CABG) (N/A)  Subjective: Looks and feels fine.  Reports that cough improved.  Some mild pleuritic pain on left.  Objective: Vital signs: BP Readings from Last 1 Encounters:  10/25/11 106/51   Pulse Readings from Last 1 Encounters:  10/25/11 77   Resp Readings from Last 1 Encounters:  10/25/11 14   Temp Readings from Last 1 Encounters:  10/25/11 98.1 F (36.7 C) Oral    Hemodynamics:    Physical Exam:  Rhythm:   sinus  Breath sounds: Markedly diminished on left  Heart sounds:  RRR  Incisions:  Clean and dry  Abdomen:  Soft, non tender  Extremities:  Warm, well perfused   Intake/Output from previous day: 03/04 0701 - 03/05 0700 In: 972 [P.O.:180; I.V.:430; IV Piggyback:362] Out: 3370 [Urine:3020; Chest Tube:350] Intake/Output this shift:    Lab Results:  Basename 10/25/11 0345 10/24/11 0800  WBC 9.0 11.9*  HGB 7.5* 8.1*  HCT 22.0* 23.4*  PLT 202 188   BMET:  Basename 10/25/11 0345 10/24/11 0800  NA 132* 134*  K 3.7 3.5  CL 94* 97  CO2 29 28  GLUCOSE 94 99  BUN 17 18  CREATININE 1.22* 1.22*  CALCIUM 9.0 9.0    CBG (last 3)   Basename 10/25/11 0319 10/24/11 2332 10/24/11 1910  GLUCAP 99 85 105*   ABG    Component Value Date/Time   PHART 7.369 10/23/2011 0235   HCO3 24.0 10/23/2011 0235   TCO2 25 10/23/2011 0235   ACIDBASEDEF 1.0 10/23/2011 0235   O2SAT 88.0 10/23/2011 0235   CXR: Persistent complete collapse of left lung  Assessment/Plan: S/P Procedure(s) (LRB): MITRAL VALVE REPAIR (MVR) (N/A) CORONARY ARTERY BYPASS GRAFTING (CABG) (N/A)  Stable hemodynamics POD4 Oxygenation improved and ambulating around SICU Complete collapse of left lung persists Acute blood loss anemia worse Trivial output via left pleural tube Expected post op volume excess, mild, diuresing well Suspected  tracheobronchitis/pneumonia with recent upper resp tract infection preop, BAL data pending on empiric Fortaz   Consult Pulm/CCM team - probably needs repeat bronch for pulm toilet  Will add empiric Vancomycin, ? Change Fortaz to [facility] - defer to [facility] team  Continue aggressive pulm toilet, nebs, mucomyst  Recheck Hgb/Hct and consider transfusion  Chest CT to r/o hemothorax, although this seems doubtful  Elodia Haviland H 10/25/2011 7:45 AM

## 2011-10-25 NOTE — Procedures (Signed)
Bronchoscopy Procedure Note Ashley [name] [id number] [birth year]  Procedure: Bronchoscopy Indications: Diagnostic evaluation of the airways, Obtain specimens for culture and/or other diagnostic studies and Remove secretions  Procedure Details Consent: Risks of procedure as well as the alternatives and risks of each were explained to the (patient/caregiver).  Consent for procedure obtained. Time Out: Verified patient identification, verified procedure, site/side was marked, verified correct patient position, special equipment/implants available, medications/allergies/relevent history reviewed, required imaging and test results available.  Performed  In preparation for procedure, patient was given 100% FiO2 and bronchoscope lubricated. Sedation: Benzodiazepines and narcotics  Airway entered and the following bronchi were examined: RUL, RML, RLL and LUL, Lingula and LLL as well as bronchi were all inspected.[name]   External compression noted on the LLL site with moderate amount of purulent secretion L>R.  No evidence of mucous plugging.  Bronchoscope removed.  , Patient placed back on 100% FiO2 at conclusion of procedure.    Evaluation Hemodynamic Status: Transient hypotension treated with fluid; O2 sats: transiently fell during during procedure Patient's Current Condition: stable Specimens:  Sent purulent fluid Complications: No apparent complications Patient did tolerate procedure well.  The whiteout is likely consolidation in nature and not atelectasis.  Ashley [name] 10/25/2011 [phone]

## 2011-10-25 NOTE — Progress Notes (Signed)
UR Completed.  Damian Buckles Jane [phone] 10/25/2011  

## 2011-10-26 ENCOUNTER — Inpatient Hospital Stay (HOSPITAL_COMMUNITY): Payer: Medicaid Other

## 2011-10-26 ENCOUNTER — Encounter (HOSPITAL_COMMUNITY): Payer: Self-pay

## 2011-10-26 LAB — LEGIONELLA ANTIGEN, URINE: Legionella Antigen, Urine: NEGATIVE

## 2011-10-26 LAB — CBC
HCT: 23.7 % — ABNORMAL LOW (ref 36.0–46.0)
Hemoglobin: 8.1 g/dL — ABNORMAL LOW (ref 12.0–15.0)
MCH: 32.8 pg (ref 26.0–34.0)
MCV: 96 fL (ref 78.0–100.0)
Platelets: 209 10*3/uL (ref 150–400)
RBC: 2.47 MIL/uL — ABNORMAL LOW (ref 3.87–5.11)

## 2011-10-26 LAB — GLUCOSE, CAPILLARY
Glucose-Capillary: 86 mg/dL (ref 70–99)
Glucose-Capillary: 88 mg/dL (ref 70–99)
Glucose-Capillary: 91 mg/dL (ref 70–99)

## 2011-10-26 LAB — TYPE AND SCREEN: Unit division: 0

## 2011-10-26 LAB — BASIC METABOLIC PANEL
BUN: 16 mg/dL (ref 6–23)
CO2: 27 mEq/L (ref 19–32)
Calcium: 8.8 mg/dL (ref 8.4–10.5)
Chloride: 99 mEq/L (ref 96–112)
Creatinine, Ser: 1.14 mg/dL — ABNORMAL HIGH (ref 0.50–1.10)
Glucose, Bld: 85 mg/dL (ref 70–99)

## 2011-10-26 MED ORDER — POTASSIUM CHLORIDE 10 MEQ/50ML IV SOLN: 10.0000 meq | INTRAVENOUS | Status: DC | PRN

## 2011-10-26 MED ORDER — TRAMADOL HCL 50 MG PO TABS: 50.0000 mg | ORAL_TABLET | ORAL | Status: DC | PRN

## 2011-10-26 MED ORDER — POTASSIUM CHLORIDE 10 MEQ/50ML IV SOLN
INTRAVENOUS | Status: AC
  Administered 2011-10-26: 10 meq via INTRAVENOUS
  Filled 2011-10-26: qty 50

## 2011-10-26 MED ORDER — SODIUM CHLORIDE 0.9 % IJ SOLN
3.0000 mL | Freq: Two times a day (BID) | INTRAMUSCULAR | Status: DC
  Administered 2011-10-26: 3 mL via INTRAVENOUS
  Administered 2011-10-26: 22:00:00 via INTRAVENOUS
  Administered 2011-10-27 – 2011-10-29 (×5): 3 mL via INTRAVENOUS

## 2011-10-26 MED ORDER — LEVOFLOXACIN 750 MG PO TABS
750.0000 mg | ORAL_TABLET | Freq: Every day | ORAL | Status: DC
  Administered 2011-10-26 – 2011-10-29 (×4): 750 mg via ORAL
  Filled 2011-10-26 (×4): qty 1

## 2011-10-26 MED ORDER — POTASSIUM CHLORIDE 10 MEQ/50ML IV SOLN
INTRAVENOUS | Status: AC
  Administered 2011-10-26: 10 meq
  Filled 2011-10-26: qty 50

## 2011-10-26 MED ORDER — OXYCODONE HCL 5 MG PO TABS: 5.0000 mg | ORAL_TABLET | ORAL | Status: DC | PRN

## 2011-10-26 MED ORDER — CARVEDILOL 12.5 MG PO TABS
12.5000 mg | ORAL_TABLET | Freq: Two times a day (BID) | ORAL | Status: DC
  Administered 2011-10-26 – 2011-10-29 (×7): 12.5 mg via ORAL
  Filled 2011-10-26 (×9): qty 1

## 2011-10-26 MED ORDER — SODIUM CHLORIDE 0.9 % IV SOLN: 250.0000 mL | INTRAVENOUS | Status: DC | PRN

## 2011-10-26 MED ORDER — MOVING RIGHT ALONG BOOK
Freq: Once | Status: AC
  Administered 2011-10-26: 08:00:00
  Filled 2011-10-26: qty 1

## 2011-10-26 MED ORDER — FUROSEMIDE 40 MG PO TABS
40.0000 mg | ORAL_TABLET | Freq: Two times a day (BID) | ORAL | Status: DC
  Administered 2011-10-26 (×2): 40 mg via ORAL
  Filled 2011-10-26 (×5): qty 1

## 2011-10-26 MED ORDER — METOPROLOL TARTRATE 25 MG PO TABS
25.0000 mg | ORAL_TABLET | Freq: Two times a day (BID) | ORAL | Status: DC
  Filled 2011-10-26 (×2): qty 1

## 2011-10-26 MED ORDER — ASPIRIN EC 81 MG PO TBEC
81.0000 mg | DELAYED_RELEASE_TABLET | Freq: Every day | ORAL | Status: DC
  Administered 2011-10-26 – 2011-10-29 (×4): 81 mg via ORAL
  Filled 2011-10-26 (×4): qty 1

## 2011-10-26 MED ORDER — ACETAMINOPHEN 325 MG PO TABS: 650.0000 mg | ORAL_TABLET | Freq: Four times a day (QID) | ORAL | Status: DC | PRN

## 2011-10-26 MED ORDER — INSULIN ASPART 100 UNIT/ML ~~LOC~~ SOLN
0.0000 [IU] | Freq: Three times a day (TID) | SUBCUTANEOUS | Status: DC
  Administered 2011-10-26 – 2011-10-28 (×4): 2 [IU] via SUBCUTANEOUS

## 2011-10-26 MED ORDER — SODIUM CHLORIDE 0.9 % IJ SOLN: 3.0000 mL | INTRAMUSCULAR | Status: DC | PRN

## 2011-10-26 MED ORDER — POTASSIUM CHLORIDE CRYS ER 20 MEQ PO TBCR: 20.0000 meq | EXTENDED_RELEASE_TABLET | Freq: Two times a day (BID) | ORAL | Status: DC

## 2011-10-26 NOTE — Progress Notes (Addendum)
   CARDIOTHORACIC SURGERY PROGRESS NOTE   R5 Days Post-Op Procedure(s) (LRB): MITRAL VALVE REPAIR (MVR) (N/A) CORONARY ARTERY BYPASS GRAFTING (CABG) (N/A)  Subjective: Feels much better except for pleuritic left chest pain and cough productive of copious purulent sputum.  Objective: Vital signs: BP Readings from Last 1 Encounters:  10/26/11 109/61   Pulse Readings from Last 1 Encounters:  10/26/11 78   Resp Readings from Last 1 Encounters:  10/26/11 19   Temp Readings from Last 1 Encounters:  10/26/11 98.7 F (37.1 C) Oral    Hemodynamics:    Physical Exam:  Rhythm:   sinus  Breath sounds: Dramatically improved  Heart sounds:  RRR no murmur  Incisions:  Clean and dry  Abdomen:  Soft, non tender  Extremities:  Warm, well perfused   Intake/Output from previous day: 03/05 0701 - 03/06 0700 In: 1856.7 [P.O.:300; I.V.:550; Blood:356.7; IV Piggyback:650] Out: 2585 [Urine:2315; Chest Tube:270] Intake/Output this shift:    Lab Results:  Basename 10/26/11 0330 10/25/11 0810  WBC 6.6 10.0  HGB 8.1* 7.9*  HCT 23.7* 23.0*  PLT 209 233   BMET:  Basename 10/26/11 0330 10/25/11 0345  NA 133* 132*  K 3.7 3.7  CL 99 94*  CO2 27 29  GLUCOSE 85 94  BUN 16 17  CREATININE 1.14* 1.22*  CALCIUM 8.8 9.0    CBG (last 3)   Basename 10/26/11 0330 10/25/11 2330 10/25/11 1925  GLUCAP 86 127* 84   ABG    Component Value Date/Time   PHART 7.369 10/23/2011 0235   HCO3 24.0 10/23/2011 0235   TCO2 25 10/23/2011 0235   ACIDBASEDEF 1.0 10/23/2011 0235   O2SAT 88.0 10/23/2011 0235   CXR: Dramatically improved with resolution of left lung atelectasis but persistent LLL consolidation  Assessment/Plan: S/P Procedure(s) (LRB): MITRAL VALVE REPAIR (MVR) (N/A) CORONARY ARTERY BYPASS GRAFTING (CABG) (N/A)  Pneumonia with resolution of left lung collapse, BAL cultures pending Stable hemodynamics Anemia improved Volume excess diuresing   Continue pulm toilet  Contnue IV  antibiotics - on empiric Vanc + Fortaz + Diflucan (I will defer antibiotic choice to Pulm/CCM team - Elita Quick best choice?)  Mobilize  D/C chest tube  D/C foley  D/C central line  Tranfer step down  Ashley [name] H 10/26/2011 7:43 AM

## 2011-10-26 NOTE — Progress Notes (Signed)
Pt's potassium 3.7 Cr 1.14 UOP >20cc/hr.  Will give 3 runs of IV potassium per TCTS protocol

## 2011-10-26 NOTE — Progress Notes (Signed)
TCTS BRIEF SICU PROGRESS NOTE  5 Days Post-Op  S/P Procedure(s) (LRB): MITRAL VALVE REPAIR (MVR) (N/A) CORONARY ARTERY BYPASS GRAFTING (CABG) (N/A)   Stable day Waiting for bed to transfer  Plan: Continue current plan  Quinnlan Abruzzo H 10/26/2011 5:35 PM

## 2011-10-26 NOTE — Progress Notes (Signed)
Name: Ashley [name] MRN: [id number] DOB: 1966/06/28    LOS: 5 Requesting MD: Cornelius Moras  PCCM CONSULT NOTE  History of Present Illness:46 yo wf with known MVP since age 30 following a stroke. 1 1/2 ppd smoker since age 52, reportedly quit Oct. 2012 ,on nicotine patch since. She underwent MV repair along with CABG x 4 on 10/21/11 and was extubated 4 hours post op. Since that time she required FOB per Dr. Cornelius Moras for mucus plugging Left lung 3/4.  CxR 3/5 with slight improvement but still with large unaerated section of Left lung.  PCCM asked to assist with pulmonary issues. Note she has bronchitis prior to surgery, and was treated with antibiotics by PCP.  She says that she stopped using cough medicine three days prior to surgery but she is unclear if she had complete resolution of symptoms.  Lines / Drains: 3/1 rt i j cvl>> 3/1 lt ct>> 3/1 ott>>3/1  Cultures: 3/4 sputum>> 3/5 blood >> 3/5 bal >> stain negative 3/5 bal AFB >> 3/5 bal fungus >> 3/4 bronch wash >> GPC pairs/chains >> OPF 3/4 NTS >>GPC pairs/chains >> OPF  Antibiotics: 3/3 fortaz>>3/6 3/5 vanc>>3/6  3/3 diflucan>>  Tests / Events: 3/1 cabg/MVR 3/4 fob 3/5 fob  Subjective: Bronch yesterday, no mass or mucus plug noted; Feels much better today, producing copius sputum  Vital Signs: Temp:  [98.2 F (36.8 C)-99.1 F (37.3 C)] 98.7 F (37.1 C) (03/06 0737) Pulse Rate:  [66-101] 79  (03/06 0800) Resp:  [10-25] 19  (03/06 0800) BP: (59-125)/(45-73) 103/71 mmHg (03/06 0800) SpO2:  [93 %-100 %] 99 % (03/06 0800) Weight:  [86.5 kg (190 lb 11.2 oz)] 86.5 kg (190 lb 11.2 oz) (03/06 0700) I/O last 3 completed shifts: In: 2432.3 [P.O.:480; I.V.:745.7; Blood:356.7; IV Piggyback:850] Out: 3800 [Urine:3390; Chest Tube:410]  Physical Examination: General:  NAD, sitting up in chair, comfortable Neuro: A&Ox4, MAEW HEENT:  Rt i j cvl. Edentulous.  Cardiovascular:  HSR RRR Chest: midline scar without erythema, edema, or  drainage Lungs: bronchial breath sounds left base otherwise clear, improved air movement left Abdomen: non tender + bs Musculoskeletal:  intact Skin:  warm  Ventilator settings:    Labs and Imaging:  Ct Chest W Contrast  10/25/2011  **ADDENDUM** CREATED: 10/25/2011 12:58:52  The second sentence of the impression #1 should read: "Left lower lung consolidation and an area of trapped GAS or cavitary change (series 3 image 32)."  Also I discussed this case by telephone with Dr. Cornelius Moras.  As we discussed the small volume of exudative fluid in the left chest could simply be postoperative blood or conceivably related to a left lung infection.  **END ADDENDUM** SIGNED BY: Harley Hallmark, M.D.    10/25/2011  *RADIOLOGY REPORT*  Clinical Data: 46 year old female with CABG 4 days ago.  Left lung collapse on chest x-ray, query pneumothorax.  CT CHEST WITH CONTRAST  Technique:  Multidetector CT imaging of the chest was performed following the standard protocol during bolus administration of intravenous contrast.  Contrast:  60 ml Omnipaque-300.  Comparison: 6:22 hours the same day and earlier.  Findings: An anterior approach left side chest tube is in place. The tube terminates at the level of the left hilum laterally. There is medial collapse of the left upper lobe.  The left mainstem bronchus tapers and is not very patent through the left hilum. There is consolidation of the lower left lung with some air bronchograms.  There is a tract pocket of gas or  cavitary area within the lung parenchyma on series 3 image 32.  Additionally, there are is hyperdensity in the left costophrenic sulcus suspicious for hyperdense fluid such as blood.  The volume of this fluid is small.  There is no pericardial effusion evident.  On the right there is a small layering low density effusion with minor atelectasis.  The trachea and right major airways are patent.  Right IJ approach central venous catheter terminates at the cavoatrial junction.  Cardiomegaly.  Sequelae of CABG and median sternotomy.  No retrosternal hematoma.  Major mediastinal vascular structures such as the aorta and central pulmonary arteries appear patent.  Negative visualized upper abdominal viscera.  Thoracic inlet within normal limits.  Chronic left lateral rib fractures.  No definite acute rib fracture.  Sequelae of sternotomy.  No acute osseous abnormality in the spine.  IMPRESSION: 1.  Left chest tube in place with small pneumothorax.  Left lower lung consolidation and an area of trapped fluid or cavitary change (series 3 image 32). 2.  There does appear to be a small volume left hemothorax. 3.  Small simple appearing right pleural effusion with only minor atelectasis. 4.  No pericardial effusion.  Cardiomegaly and sequelae of CABG.  Study discussed by telephone with nurse Stacy at the bedside on 10/25/2011 at pneumonia.  She is passing along the information to the covering surgeon who I requested call me directly with any questions in this case. Original Report Authenticated By: Harley Hallmark, M.D.   Dg Chest Port 1 View  10/26/2011  *RADIOLOGY REPORT*  Clinical Data: Bypass surgery.  PORTABLE CHEST - 1 VIEW  Comparison: 10/25/2011.  Findings: There is a tiny residual apical pneumothorax.  The left- sided chest tube is stable.  The right IJ catheter is stable. There is persistent edema, atelectasis and small effusion on the left.  Right lung is clear.  IMPRESSION:  1.  Near complete resolution of small left-sided pneumothorax. 2.  Persistent edema, atelectasis and small effusion on the left.  Original Report Authenticated By: P. Loralie Champagne, M.D.   Dg Chest Port 1 View  10/25/2011  *RADIOLOGY REPORT*  Clinical Data: Post bronchoscopy.  PORTABLE CHEST - 1 VIEW  Comparison: 10/25/2011.  Findings: Interval decrease in degree of consolidation involving the left lung.  Left-sided chest tube remains in place with tiny left apical pneumothorax.  Mild mediastinal shift to the  left. Cardiomegaly.  Post CABG.  Central pulmonary vascular prominence.  Interval development right base subsegmental atelectasis.  Right central line tip mid to distal superior vena cava.  IMPRESSION: Interval decrease in degree of consolidation involving the left lung.   Left-sided chest tube remains in place with tiny left apical pneumothorax.  Mild mediastinal shift to the left.  Cardiomegaly.  Post CABG.  Central pulmonary vascular prominence.  Interval development right base subsegmental atelectasis.  Original Report Authenticated By: Fuller Canada, M.D.   Dg Chest Port 1 View  10/25/2011  *RADIOLOGY REPORT*  Clinical Data: Follow up heart surgery  PORTABLE CHEST - 1 VIEW  Comparison: 10/24/2011  Findings: There is a right IJ catheter with tip in the SVC.  There is a left-sided chest tube in place.  Left sided hydropneumothorax is identified. There is near complete opacification of the left lung.  The pneumothorax component appears increased in volume from previous exam.  Right lung is clear.  IMPRESSION:  1.  Left sided hydropneumothorax with increase in pneumothorax component along the left upper lobe/apex.  These results will be  called to the ordering clinician or representative by the Radiologist Assistant, and communication documented in the PACS Dashboard.  Original Report Authenticated By: Rosealee Albee, M.D.   Dg Chest Port 1 View  10/24/2011  *RADIOLOGY REPORT*  Clinical Data: Status post bronchoscopy  PORTABLE CHEST - 1 VIEW  Comparison: 10/24/2011 at 0651 hours  Findings: Near complete opacification of the left hemithorax, unchanged. Stable left chest tube.  No definite pneumothorax is seen.  Right lung is essentially clear.  Postsurgical changes related to prior CABG.  Valve annuloplasty.  Stable right IJ venous catheter.  IMPRESSION: Stable left chest tube.  No definite pneumothorax is seen.  Near complete opacification of the left hemithorax, unchanged.  Original Report Authenticated By:  Charline Bills, M.D.    Lab 10/26/11 0330 10/25/11 0345 10/24/11 0800  NA 133* 132* 134*  K 3.7 3.7 3.5  CL 99 94* 97  CO2 27 29 28   BUN 16 17 18   CREATININE 1.14* 1.22* 1.22*  GLUCOSE 85 94 99    Lab 10/26/11 0330 10/25/11 0810 10/25/11 0345  HGB 8.1* 7.9* 7.5*  HCT 23.7* 23.0* 22.0*  WBC 6.6 10.0 9.0  PLT 209 233 202   ABG    Component Value Date/Time   PHART 7.369 10/23/2011 0235   PCO2ART 41.7 10/23/2011 0235   PO2ART 57.0* 10/23/2011 0235   HCO3 24.0 10/23/2011 0235   TCO2 25 10/23/2011 0235   ACIDBASEDEF 1.0 10/23/2011 0235   O2SAT 88.0 10/23/2011 0235    Assessment and Plan: 46 y/o female with left lung collapse following MVR?CABG  10/21/11. Had pre-op bronchitis which had resolved but developed left lower lobe collapse and pneumonia post operatively.  L lung collapse improving with bronch x2 and aggressive pulm toilette.  -flutter valve q1 hour -IPPB for xopenex and inhaled nebulized hypertonic saline q4 hours -continue guaifenesin -in regards to antibiotics, she currently has shown marked improvement (100% RA, improved CXR, afebrile, normal WBC); would stop IV antibiotics today and start levaquin 750mg  po daily for culture negative pneumonia -if continues to improve in house then would give Rx for 14 day course to complete at home -diflucan? Uncertain indication; per primary service -will need f/u CXR in 3-4 weeks as outpatient to ensure resolution -lasix/warfarin per primary team  Post MVR/CABG -per cvts  Hx of stroke -monitor  Best practices / Disposition: -->ICU status under CVTS -->full code -->Heparin for DVT Px -->Protonix for GI Px -->diet , eating   Yolonda Kida PCCM Pager: [phone] If no response, call [phone]

## 2011-10-27 ENCOUNTER — Encounter (HOSPITAL_COMMUNITY): Payer: Self-pay | Admitting: General Practice

## 2011-10-27 ENCOUNTER — Inpatient Hospital Stay (HOSPITAL_COMMUNITY): Payer: Medicaid Other

## 2011-10-27 LAB — CBC
HCT: 27.9 % — ABNORMAL LOW (ref 36.0–46.0)
MCH: 32.5 pg (ref 26.0–34.0)
MCV: 97.6 fL (ref 78.0–100.0)
Platelets: 265 10*3/uL (ref 150–400)
RBC: 2.86 MIL/uL — ABNORMAL LOW (ref 3.87–5.11)
WBC: 7.3 10*3/uL (ref 4.0–10.5)

## 2011-10-27 LAB — CULTURE, RESPIRATORY W GRAM STAIN

## 2011-10-27 LAB — GLUCOSE, CAPILLARY
Glucose-Capillary: 96 mg/dL (ref 70–99)
Glucose-Capillary: 102 mg/dL — ABNORMAL HIGH (ref 70–99)
Glucose-Capillary: 127 mg/dL — ABNORMAL HIGH (ref 70–99)

## 2011-10-27 LAB — BASIC METABOLIC PANEL
BUN: 18 mg/dL (ref 6–23)
CO2: 29 mEq/L (ref 19–32)
Calcium: 8.9 mg/dL (ref 8.4–10.5)
Chloride: 98 mEq/L (ref 96–112)
Creatinine, Ser: 1.31 mg/dL — ABNORMAL HIGH (ref 0.50–1.10)
Glucose, Bld: 111 mg/dL — ABNORMAL HIGH (ref 70–99)

## 2011-10-27 MED ORDER — POTASSIUM CHLORIDE CRYS ER 20 MEQ PO TBCR
20.0000 meq | EXTENDED_RELEASE_TABLET | Freq: Every day | ORAL | Status: DC
  Administered 2011-10-27 – 2011-10-29 (×3): 20 meq via ORAL
  Filled 2011-10-27 (×3): qty 1

## 2011-10-27 MED ORDER — WARFARIN SODIUM 5 MG PO TABS
5.0000 mg | ORAL_TABLET | Freq: Every day | ORAL | Status: DC
  Administered 2011-10-27 – 2011-10-28 (×2): 5 mg via ORAL
  Filled 2011-10-27 (×3): qty 1

## 2011-10-27 MED ORDER — FUROSEMIDE 40 MG PO TABS
40.0000 mg | ORAL_TABLET | Freq: Every day | ORAL | Status: DC
  Administered 2011-10-27 – 2011-10-29 (×3): 40 mg via ORAL
  Filled 2011-10-27 (×2): qty 1

## 2011-10-27 NOTE — Progress Notes (Signed)
Name: Ashley [name] MRN: [id number] DOB: October 31, 1965    LOS: 6 Requesting MD: Cornelius Moras  PCCM CONSULT NOTE  History of Present Illness:46 yo wf with known MVP since age 69 following a stroke. 1 1/2 ppd smoker since age 37, reportedly quit Oct. 2012 ,on nicotine patch since. She underwent MV repair along with CABG x 4 on 10/21/11 and was extubated 4 hours post op. Since that time she required FOB per Dr. Cornelius Moras for mucus plugging Left lung 3/4.  CxR 3/5 with slight improvement but still with large unaerated section of Left lung.  PCCM asked to assist with pulmonary issues. Note she has bronchitis prior to surgery, and was treated with antibiotics by PCP.  She says that she stopped using cough medicine three days prior to surgery but she is unclear if she had complete resolution of symptoms.  Lines / Drains: 3/1 rt i j cvl>> 3/1 lt ct>> 3/1 ott>>3/1  Cultures: 3/4 sputum>> 3/5 blood >> 3/5 bal >> stain negative 3/5 bal AFB >> 3/5 bal fungus >> 3/4 bronch wash >> GPC pairs/chains >> OPF 3/4 NTS >>GPC pairs/chains >> OPF  Antibiotics: 3/3 fortaz>>3/6 3/5 vanc>>3/6  3/3 diflucan>>3/6 3/6 Levaquin >>  Tests / Events: 3/1 cabg/MVR 3/4 fob 3/5 fob  Subjective: Sputum production declining, feeling better.  Vital Signs: Temp:  [98.4 F (36.9 C)-99.3 F (37.4 C)] 98.4 F (36.9 C) (03/06 2300) Pulse Rate:  [74-94] 94  (03/07 0600) Resp:  [11-27] 20  (03/07 0500) BP: (101-140)/(43-85) 130/74 mmHg (03/07 0600) SpO2:  [94 %-100 %] 100 % (03/07 0600) Weight:  [85.3 kg (188 lb 0.8 oz)] 85.3 kg (188 lb 0.8 oz) (03/07 0600) I/O last 3 completed shifts: In: 1600 [P.O.:1000; I.V.:250; IV Piggyback:350] Out: 4005 [Urine:3845; Chest Tube:160]  Physical Examination: General:  NAD, sitting up in chair, comfortable Neuro: A&Ox4, MAEW HEENT:  Rt i j cvl. Edentulous.  Cardiovascular:  HSR RRR Chest: midline scar without erythema, edema, or drainage Lungs: CTA LUL, diminished but present breath  sounds left base, clear on right Abdomen: non tender + bs Musculoskeletal:  intact Skin:  warm  Ventilator settings:    Labs and Imaging:  Dg Chest 2 View  10/27/2011  *RADIOLOGY REPORT*  Clinical Data: Left lung consolidation and atelectasis, follow-up  CHEST - 2 VIEW  Comparison: 10/26/2011  Findings: Enlargement of cardiac silhouette post CABG and MVR. Atherosclerotic calcification aortic arch. Pulmonary vascularity normal. Persistent infiltrate left mid lung. Remaining lungs clear. Small bibasilar effusions. Question underlying emphysematous changes. No pneumothorax.  IMPRESSION: Enlargement of cardiac silhouette post CABG and MVR. Mild persistent infiltrate left mid lung with small bibasilar effusions.  Original Report Authenticated By: Lollie Marrow, M.D.   Ct Chest W Contrast  10/25/2011  **ADDENDUM** CREATED: 10/25/2011 12:58:52  The second sentence of the impression #1 should read: "Left lower lung consolidation and an area of trapped GAS or cavitary change (series 3 image 32)."  Also I discussed this case by telephone with Dr. Cornelius Moras.  As we discussed the small volume of exudative fluid in the left chest could simply be postoperative blood or conceivably related to a left lung infection.  **END ADDENDUM** SIGNED BY: Harley Hallmark, M.D.    10/25/2011  *RADIOLOGY REPORT*  Clinical Data: 46 year old female with CABG 4 days ago.  Left lung collapse on chest x-ray, query pneumothorax.  CT CHEST WITH CONTRAST  Technique:  Multidetector CT imaging of the chest was performed following the standard protocol during bolus administration of intravenous  contrast.  Contrast:  60 ml Omnipaque-300.  Comparison: 6:22 hours the same day and earlier.  Findings: An anterior approach left side chest tube is in place. The tube terminates at the level of the left hilum laterally. There is medial collapse of the left upper lobe.  The left mainstem bronchus tapers and is not very patent through the left hilum. There is  consolidation of the lower left lung with some air bronchograms.  There is a tract pocket of gas or cavitary area within the lung parenchyma on series 3 image 32.  Additionally, there are is hyperdensity in the left costophrenic sulcus suspicious for hyperdense fluid such as blood.  The volume of this fluid is small.  There is no pericardial effusion evident.  On the right there is a small layering low density effusion with minor atelectasis.  The trachea and right major airways are patent.  Right IJ approach central venous catheter terminates at the cavoatrial junction. Cardiomegaly.  Sequelae of CABG and median sternotomy.  No retrosternal hematoma.  Major mediastinal vascular structures such as the aorta and central pulmonary arteries appear patent.  Negative visualized upper abdominal viscera.  Thoracic inlet within normal limits.  Chronic left lateral rib fractures.  No definite acute rib fracture.  Sequelae of sternotomy.  No acute osseous abnormality in the spine.  IMPRESSION: 1.  Left chest tube in place with small pneumothorax.  Left lower lung consolidation and an area of trapped fluid or cavitary change (series 3 image 32). 2.  There does appear to be a small volume left hemothorax. 3.  Small simple appearing right pleural effusion with only minor atelectasis. 4.  No pericardial effusion.  Cardiomegaly and sequelae of CABG.  Study discussed by telephone with nurse Stacy at the bedside on 10/25/2011 at pneumonia.  She is passing along the information to the covering surgeon who I requested call me directly with any questions in this case. Original Report Authenticated By: Harley Hallmark, M.D.   Dg Chest Port 1 View  10/26/2011  *RADIOLOGY REPORT*  Clinical Data: Bypass surgery.  PORTABLE CHEST - 1 VIEW  Comparison: 10/25/2011.  Findings: There is a tiny residual apical pneumothorax.  The left- sided chest tube is stable.  The right IJ catheter is stable. There is persistent edema, atelectasis and small  effusion on the left.  Right lung is clear.  IMPRESSION:  1.  Near complete resolution of small left-sided pneumothorax. 2.  Persistent edema, atelectasis and small effusion on the left.  Original Report Authenticated By: P. Loralie Champagne, M.D.   Dg Chest Port 1 View  10/25/2011  *RADIOLOGY REPORT*  Clinical Data: Post bronchoscopy.  PORTABLE CHEST - 1 VIEW  Comparison: 10/25/2011.  Findings: Interval decrease in degree of consolidation involving the left lung.  Left-sided chest tube remains in place with tiny left apical pneumothorax.  Mild mediastinal shift to the left. Cardiomegaly.  Post CABG.  Central pulmonary vascular prominence.  Interval development right base subsegmental atelectasis.  Right central line tip mid to distal superior vena cava.  IMPRESSION: Interval decrease in degree of consolidation involving the left lung.   Left-sided chest tube remains in place with tiny left apical pneumothorax.  Mild mediastinal shift to the left.  Cardiomegaly.  Post CABG.  Central pulmonary vascular prominence.  Interval development right base subsegmental atelectasis.  Original Report Authenticated By: Fuller Canada, M.D.    Lab 10/27/11 0433 10/26/11 0330 10/25/11 0345  NA 136 133* 132*  K 4.8 3.7 3.7  CL 98 99 94*  CO2 29 27 29   BUN 18 16 17   CREATININE 1.31* 1.14* 1.22*  GLUCOSE 111* 85 94    Lab 10/27/11 0433 10/26/11 0330 10/25/11 0810  HGB 9.3* 8.1* 7.9*  HCT 27.9* 23.7* 23.0*  WBC 7.3 6.6 10.0  PLT 265 209 233   ABG    Component Value Date/Time   PHART 7.369 10/23/2011 0235   PCO2ART 41.7 10/23/2011 0235   PO2ART 57.0* 10/23/2011 0235   HCO3 24.0 10/23/2011 0235   TCO2 25 10/23/2011 0235   ACIDBASEDEF 1.0 10/23/2011 0235   O2SAT 88.0 10/23/2011 0235    Assessment and Plan: 46 y/o female with left lung collapse following MVR?CABG  10/21/11. Had pre-op bronchitis which had resolved but developed left lower lobe collapse and pneumonia post operatively.  L lung collapse improving with bronch  x2 and aggressive pulm toilette.  -flutter valve q1 hour -CXR much improved, change nebs to prn -ambulate -continue guaifenesin -Levaquin for 14 day course -will need outpatient CXR by PCP in 3-4 weeks to ensure resolution of pneumonia -review of pre-op PFTs show hyperinflation but no obstruction -I don't feel strongly that she needs pulmonary follow up as an outpatient but if desired by primary service we can arrange  PCCM will sign off, call if questions  Yolonda Kida PCCM Pager: [phone] If no response, call [phone]

## 2011-10-27 NOTE — Plan of Care (Signed)
Problem: Phase III Progression Outcomes Goal: Time patient transferred to PCTU/Telemetry POD Outcome: Completed/Met Date Met:  10/27/11 Pt transferred to 2007 at 1230

## 2011-10-27 NOTE — Progress Notes (Signed)
CARDIAC REHAB PHASE I   PRE:  Rate/Rhythm: 95SR  BP:  Supine:   Sitting: 126/72  Standing:    SaO2: 99%RA  MODE:  Ambulation: 550 ft   POST:  Rate/Rhythem: 120ST, to 98SR with rest  BP:  Supine:   Sitting: 126/70  Standing:    SaO2: 93-99%RA 1300-1330 Pt walked 550 ft on RA with rolling walker and minimal asst. Tolerated well. To bed after walk. Pt does not think she will need walker for home.  Ashley [name]

## 2011-10-27 NOTE — Progress Notes (Signed)
Chaplain made initial visit with patient who stated she is coping okay. No follow up needed.

## 2011-10-27 NOTE — Progress Notes (Signed)
   CARDIOTHORACIC SURGERY PROGRESS NOTE   R6 Days Post-Op Procedure(s) (LRB): MITRAL VALVE REPAIR (MVR) (N/A) CORONARY ARTERY BYPASS GRAFTING (CABG) (N/A)  Subjective: Feels much better.  Still waiting for bed to transfer.  Cough persists but improved.  Objective: Vital signs: BP Readings from Last 1 Encounters:  10/27/11 130/74   Pulse Readings from Last 1 Encounters:  10/27/11 94   Resp Readings from Last 1 Encounters:  10/27/11 20   Temp Readings from Last 1 Encounters:  10/26/11 98.4 F (36.9 C) Oral    Hemodynamics:    Physical Exam:  Rhythm:   sinus  Breath sounds: Fairly clear  Heart sounds:  RRR no murmur  Incisions:  Clean and dry  Abdomen:  Soft, non tender  Extremities:  Warm, well perfused   Intake/Output from previous day: 03/06 0701 - 03/07 0700 In: 830 [P.O.:700; I.V.:80; IV Piggyback:50] Out: 2550 [Urine:2500; Chest Tube:50] Intake/Output this shift:    Lab Results:  Basename 10/27/11 0433 10/26/11 0330  WBC 7.3 6.6  HGB 9.3* 8.1*  HCT 27.9* 23.7*  PLT 265 209   BMET:  Basename 10/27/11 0433 10/26/11 0330  NA 136 133*  K 4.8 3.7  CL 98 99  CO2 29 27  GLUCOSE 111* 85  BUN 18 16  CREATININE 1.31* 1.14*  CALCIUM 8.9 8.8    CBG (last 3)   Basename 10/26/11 2132 10/26/11 1817 10/26/11 1203  GLUCAP 139* 91 96   ABG    Component Value Date/Time   PHART 7.369 10/23/2011 0235   HCO3 24.0 10/23/2011 0235   TCO2 25 10/23/2011 0235   ACIDBASEDEF 1.0 10/23/2011 0235   O2SAT 88.0 10/23/2011 0235   CXR: Further improvement - looks good  Assessment/Plan: S/P Procedure(s) (LRB): MITRAL VALVE REPAIR (MVR) (N/A) CORONARY ARTERY BYPASS GRAFTING (CABG) (N/A)  Progressing very well now POD6 LLL pneumonia improving, atelectasis resolved Expected post op acute blood loss anemia, mild, improved Expected post op volume excess, mild, weight down close to baseline   Mobilize  Transfer 2000  Increase coumadin dose  Decrease lasix  dose  Continue Levaquin  Recheck creatinine, electrolytes in am  Shubham Thackston H 10/27/2011 7:34 AM

## 2011-10-28 LAB — PROTIME-INR
INR: 1.15 (ref 0.00–1.49)
Prothrombin Time: 14.9 seconds (ref 11.6–15.2)

## 2011-10-28 LAB — BASIC METABOLIC PANEL
Calcium: 9.4 mg/dL (ref 8.4–10.5)
Chloride: 98 mEq/L (ref 96–112)
Creatinine, Ser: 1.25 mg/dL — ABNORMAL HIGH (ref 0.50–1.10)
GFR calc Af Amer: 58 mL/min — ABNORMAL LOW (ref >90)
GFR calc non Af Amer: 50 mL/min — ABNORMAL LOW (ref >90)

## 2011-10-28 LAB — GLUCOSE, CAPILLARY: Glucose-Capillary: 123 mg/dL — ABNORMAL HIGH (ref 70–99)

## 2011-10-28 LAB — CULTURE, BAL-QUANTITATIVE W GRAM STAIN

## 2011-10-28 MED ORDER — GUAIFENESIN ER 600 MG PO TB12: 600.0000 mg | ORAL_TABLET | Freq: Two times a day (BID) | ORAL | Status: DC

## 2011-10-28 MED ORDER — FUROSEMIDE 40 MG PO TABS: 40.0000 mg | ORAL_TABLET | Freq: Every day | ORAL | Status: DC

## 2011-10-28 MED ORDER — WARFARIN SODIUM 5 MG PO TABS: 5.0000 mg | ORAL_TABLET | Freq: Every day | ORAL | Status: DC

## 2011-10-28 MED ORDER — LEVOFLOXACIN 750 MG PO TABS: 750.0000 mg | ORAL_TABLET | Freq: Every day | ORAL | Status: AC

## 2011-10-28 MED ORDER — POTASSIUM CHLORIDE CRYS ER 20 MEQ PO TBCR: 20.0000 meq | EXTENDED_RELEASE_TABLET | Freq: Every day | ORAL | Status: DC

## 2011-10-28 MED ORDER — OXYCODONE HCL 5 MG PO TABS: 5.0000 mg | ORAL_TABLET | ORAL | Status: AC | PRN

## 2011-10-28 NOTE — Discharge Summary (Signed)
I agree with the above discharge summary and plan for follow-up.  Ashley [name] H  

## 2011-10-28 NOTE — Progress Notes (Signed)
UR completed Wai Litt Crowder 10/28/2011 [phone] 

## 2011-10-28 NOTE — Progress Notes (Signed)
CARDIAC REHAB PHASE I   PRE:  Rate/Rhythm: 86 SR    BP: sitting 124/70    SaO2: 100 RA  MODE:  Ambulation: 790 ft   POST:  Rate/Rhythm: 104 ST    BP: sitting 110/60     SaO2: 96 RA  Tolerated well. No c/o. Did not need RW, steady. VSS. Ed completed. Pt requests her name be sent to [facility]. Will send. [phone]  Harriet Masson CES, ACSM

## 2011-10-28 NOTE — Discharge Instructions (Signed)
Mitral Valve Replacement/Repair and Coronary Artery Bypass Grafting Care After Refer to this sheet in the next few weeks. These instructions provide you with information on caring for yourself after your procedure. Your caregiver may also give you more specific instructions. Your treatment has been planned according to current medical practices, but problems sometimes occur. Call your caregiver if you have any problems or questions after your procedure.  Recovery from open heart surgery will be different for everyone. Some people feel well after 3 or 4 weeks, while for others it takes longer. After heart surgery, it may be normal to: Not have an appetite, feel nauseated by the smell of food, or only want to eat a small amount.  Be constipated because of changes in your diet, activity, and medicines. Eat foods high in fiber. Add fresh fruits and vegetables to your diet. Stool softeners may be helpful.  Feel sad or unhappy. You may be frustrated or cranky. You may have good days and bad days. Do not give up. Talk to your caregiver if you do not feel better.  Feel weakness and fatigue. You many need physical therapy or cardiac rehabilitation to get your strength back.  Develop an irregular heartbeat called atrial fibrillation. Symptoms of atrial fibrillation are a fast, irregular heartbeat or feelings of fluttery heartbeats, shortness of breath, low blood pressure, and dizziness. If these symptoms develop, see your caregiver right away.  MEDICATION Have a list of all the medicines you will be taking when you leave the hospital. For every medicine, know the following:  Name.  Exact dose.  Time of day to be taken.  How often it should be taken.  Why you are taking it.  Ask which medicines should or should not be taken together. If you take more than one heart medicine, ask if it is okay to take them together. Some heart medicines should not be taken at the same time because they may lower your blood  pressure too much.  Narcotic pain medicine can cause constipation. Eat fresh fruits and vegetables. Add fiber to your diet. Stool softener medicine may help relieve constipation.  Keep a copy of your medicines with you at all times.  Do not add or stop taking any medicine until you check with your caregiver.  Medicines can have side effects. Call your caregiver who prescribed the medicine if you:  Start throwing up, have diarrhea, or have stomach pain.  Feel dizzy or lightheaded when you stand up.  Feel your heart is skipping beats or is beating too fast or too slow.  Develop a rash.  Notice unusual bruising or bleeding.  HOME CARE INSTRUCTIONS After heart surgery, it is important to learn how to take your pulse. Have your caregiver show you how to take your pulse.  Use your incentive spirometer. Ask your caregiver how long after surgery you need to use it.  Care of your chest incision Tell your caregiver right away if you notice clicking in your chest (sternum).  Support your chest with a pillow or your arms when you take deep breaths and cough.  Follow your caregiver's instructions about when you can bathe or swim.  Protect your incision from sunlight during the first year to keep the scar from getting dark.  Tell your caregiver if you notice:  Increased tenderness of your incision.  Increased redness or swelling around your incision.  Drainage or pus from your incision.  Care of your leg incision(s) Avoid crossing your legs.  Avoid sitting for  long periods of time. Change positions every half hour.  Elevate your leg(s) when you are sitting.  Check your leg(s) daily for swelling. Check the incisions for redness or drainage.  Wear your elastic stockings as told by your caregiver. Take them off at bedtime.  Diet Diet is very important to heart health.  Eat plenty of fresh fruits and vegetables. Meats should be lean cut. Avoid canned, processed, and fried foods.  Talk to a dietician.  They can teach you how to make healthy food and drink choices.  Weight Weigh yourself every day. This is important because it helps to know if you are retaining fluid that may make your heart and lungs work harder.  Use the same scale each time.  Weigh yourself every morning at the same time. You should do this after you go to the bathroom, but before you eat breakfast.  Your weight will be more accurate if you do not wear any clothes.  Record your weight.  Tell your caregiver if you have gained 2 pounds or more overnight.  Activity Stop any activity at once if you have chest pain, shortness of breath, irregular heartbeats, or dizziness. Get help right away if you have any of these symptoms. Bathing.  Avoid soaking in a bath or hot tub until your incisions are healed.  Rest. You need a balance of rest and activity.  Exercise. Exercise per your caregiver's advice. You may need physical therapy or cardiac rehabilitation to help strengthen your muscles and build your endurance.  Climbing stairs. Unless your caregiver tells you not to climb stairs, go up stairs slowly and rest if you tire. Do not pull yourself up by the handrail.  Driving a car. Follow your caregiver's advice on when you may drive. You may ride as a passenger at any time. When traveling for long periods of time in a car, get out of the car and walk around for a few minutes every 2 hours.  Lifting. Avoid lifting, pushing, or pulling anything heavier than 10 pounds for 6 weeks after surgery or as told by your caregiver.  Returning to work. Check with your caregiver. People heal at different rates. Most people will be able to go back to work 6 to 12 weeks after surgery.  Sexual activity. You may resume sexual relations as told by your caregiver.  SEEK MEDICAL CARE IF: Any of your incisions are red, painful, or have any type of drainage coming from them.  You have an oral temperature above 102 F (38.9 C).  You have ankle or leg  swelling.  You have pain in your legs.  You have weight gain of 2 or more pounds a day.  You feel dizzy or lightheaded when you stand up.  SEEK IMMEDIATE MEDICAL CARE IF: You have angina or chest pain that goes to your jaw or arms. Call your local emergency services right away.  You have shortness of breath at rest or with activity.  You have a fast or irregular heartbeat (arrhythmia).  There is a "clicking" in your sternum when you move.  You have numbness or weakness in your arms or legs.  MAKE SURE YOU: Understand these instructions.  Will watch your condition.  Will get help right away if you are not doing well or get worse.  Document Released: 02/25/2005 Document Revised: 07/28/2011 Document Reviewed: 10/13/2010 [facility] Patient Information 2012 [place], [place].   You have a disease of one of the valves of your heart. In you or your  child's case, it is the Mitral valve which needs replacing. Mitral valve replacement or repair is open heart surgery done by a heart surgeon. This operation will treat problems with the mitral valve. The mitral valve is the "inflow valve" for the left side of the heart. Blood flows from the lungs, where it picks up oxygen, and enters the heart through the mitral valve. When it opens, the mitral valve allows blood to flow into the heart's main pumping chamber. This is called the left ventricle. The valve then closes when the ventricle contracts for pumping. This keeps blood from leaking back into the lungs when the ventricle contracts (squeezes). This pumping then pushes blood out to the rest of the body. This surgery is usually done when your heart no longer is able to handle the daily chores of your body. Surgery may be necessary when the valve does not open or close completely. A stenotic (narrow) valve does not let the blood into the heart normally. This causes blood to back up in the lungs. This makes it hard for the heart to increase the amount of blood that  it pumps. This may produce shortness of breath and fatigue. Problems are worse with activity.  If the valve leaflets do not meet correctly when closing, blood may leak backward into the lungs each time the heart pumps. This is called mitral insufficiency. When some of the blood leaks backwards, the heart has to work even harder. The heart can compensate for this over-work for a long time if the leakage came on slowly. Eventually, the heart fails.  Mitral valve problems are uncommonly caused by a birth defect. This is called congenital. More often, simple "wear and tear" may cause the valve to fail. This is called "degenerative disease". This may be related to aging although many young people will have this condition as well. Rheumatic fever may damage the mitral valve. Occasionally, the mitral valve may be damaged by infection. This also causes the mitral valve to leak.  DESCRIPTION OF SURGERY Many mitral valves can be repaired, especially if they leak from wearing out. When the valve is too damaged to repair, the valve must be replaced. A prosthetic (artificial) valve is used to do this. Valves damaged by rheumatic disease often must be replaced.  Two types of artificial valves are available:  Mechanical valves made entirely from man-made materials.   Biological valves which are made from animal tissues.  Each has advantages and disadvantages. The choice of which type to use should be made by you and your surgeon taking the following into consideration:   Your age.   Your lifestyle.   Other medical conditions.   Your preferences with regard to medications and the risk of another operation.  There are a number of good mechanical prostheses available. All work well. The main advantage of mechanical valves is that they do not wear out. Their main disadvantage is that blood has a tendency to clot on mechanical valves. If this happens the valve will not work normally. Because of this, patients with  mechanical valves must take anticoagulants (blood thinners) for life. There is also a small but definite risk of blood clots causing stroke, even when taking anticoagulants.  There are a number of biological choices for mitral valve replacement. Most are made from pig aortic valves. Their main advantage is that they have a reduced risk of blood clots forming on the valve. This lessens the chance of the valve not working or causing a stroke.  The key disadvantage of biological or tissue valves is that they have less of a user life as compared with mechanical valves. Over time they will wear out. The rate at which they wear out, however, depends on the patient's age. A young boy might wear out such a valve in only a few years. The same valve might last 10 years in a middle aged person, and even longer in a patient over the age of 87. A tissue valve used in a person over 7 years old will probably not need to be replaced.  RISKS AND COMPLICATIONS Your cardiologist and cardiothoracic surgeon can best determine your individual risk. It will depend on your age, general condition, medical conditions, and your heart function. In general, the risks include:  Effects from the operation itself such as bleeding, infection, and risks of anesthesia are low.   Blood clotting caused by the new valve. Replacement with a mechanical valve requires lifelong treatment with medication to prevent blood clots.   Infection in the new valve. Infection is more common with valve replacement than with valve repair.   Valve failure. Valve failure is more common with valve replacement than with valve repair. Pig valves tend to fail after about 8 to 10 years.  PROCEDURE  Valve repair or replacement is open-heart surgery. You are given general anesthesia (medications to help you sleep). You are then placed on a heart-lung machine. This machine provides oxygen to your blood while the heart is not working. The surgery generally lasts from  3 to 5 hours. During surgery, the surgeon makes a large incision (cut) in the chest. Sometimes the heart is cooled to slow or stop the heartbeat. The damaged mitral valve is either repaired or removed and replaced with an artificial heart valve. AFTER THE PROCEDURE  Recovery from heart valve surgery usually involves a few days in an intensive care unit (ICU) of a hospital. Full recovery from heart valve surgery can take several months.   Anticoagulation (blood thinning) treatment with warfarin is often prescribed for 6 weeks to 3 months after surgery for those with biological valves. It is prescribed for life for those with mechanical valves.   Recovery includes healing of the surgical incision, gradual building of physical endurance, and exercise. An exercise program under the direction of a physical therapist is usually recommended.   Once you have an artificial valve, your heart function and your life will return to normal. You usually feel better than before surgery if you were having problems before the surgery. You should no longer experience shortness of breath and fatigue. However, if your heart was already severely damaged before your surgery, you may continue to have complications of heart disease.   You should be able to resume most of your normal activities, although you will have to continue to monitor your condition. You need to watch out for blood clots and infections.   Artificial valves need to be replaced after a period of time, so it is important that you see your caregiver regularly.   Some individuals with a mitral valve replacement need to take antibiotics before having dental work or other surgical procedures. This is called prophylactic antibiotic treatment. These drugs help to prevent infective endocarditis. Antibiotics are only recommended for individuals with the highest risk for developing infective endocarditis. Let your dentist and your caregiver know if you have a  history of any of the following so that the necessary precautions can be taken:   A VSD.   A  repaired VSD.   Endocarditis in the past.   An artificial (prosthetic) heart valve.  HOME CARE INSTRUCTIONS   Use all medications as prescribed.   Take your temperature every morning for the first week after surgery. Record these.   Weigh yourself every morning for at least the first week after surgery and record your weight.   Do not lift more than 10 pounds (4.5. kg) until your sternum (breastbone) has healed. Avoid all activities which would place strain on your incision.   You may shower but do not take baths until instructed by your caregivers.   Avoid driving for 4 to 6 weeks following surgery or as instructed.   Use your elastic stockings during the day. You should wear the stockings for at least 2 weeks after discharge or longer if your ankles are swollen. The stockings help blood flow and help reduce swelling in the legs. It is easiest to put the stockings on before you get out of bed in the morning. They should fit snugly.  SEEK IMMEDIATE MEDICAL CARE IF:  You develop chest pain which is not coming from your incision (surgical cut).   You develop shortness of breath.   You develop a temperature over 102 F (38.9 C).   You have a sudden weight gain. Let your caregiver know what the weight gain is.  Document Released: 12/09/2004 Document Revised: 07/28/2011 Document Reviewed: 08/08/2005 [facility] Patient Information 2012 [place], [place].

## 2011-10-28 NOTE — Discharge Summary (Signed)
[address]            [place] [address]          [phone]      Ashley [name] September 22, 1965 46 y.o. [id number]  10/21/2011   Purcell Nails, MD  CAD,MITRAL REGURGITATION  HPI: Patient referred to Tressie Stalker M.D. for cardiothoracic surgical consultation. Patient is a 46 year old married white female from [place] with history of heart murmur discovered 20 years ago when the patient suffered a stroke at age 43. She was told that she had mitral valve prolapse at that time. She otherwise has no cardiac history and had not seen a physician in many years. She was hospitalized in September 2012 because of right-sided abdominal discomfort. At that time there was some question as to whether or not she might have cholecystitis, but a high scan was negative. Gallbladder ultrasound revealed thickened gallbladder wall. The patient was seen in consultation by Dr. Renee Harder from the general surgery service at that time. Her symptoms resolved and were thought perhaps to be related to passive congestion of the liver. She was also noted to have severe mitral regurgitation with moderate left ventricular dysfunction. She had severe hypertension that was poorly controlled at the time of her initial hospital presentation. Since then she has been followed carefully by Dr. Tenny Craw. She ultimately underwent left and right heart catheterization by Dr. Riley Kill 06/07/2011. This revealed severe three-vessel coronary artery disease with moderate left ventricular dysfunction and at least moderate mitral regurgitation. The patient has been seen back since then in followup by Dr. Tenny Craw and underwent transesophageal echocardiogram 06/30/2011. This revealed severe mitral regurgitation. The patient was referred for possible elective surgical intervention and originally seen in consultation last November. She desired to hold off on surgery until after the first of the year and now presents for  elective surgical intervention.  Past Medical History   Diagnosis  Date   .  Abdominal pain      ? chronic cholecystitis; admxn to [facility] 9/12 (CT, USN, HIDA done)   .  Stroke      in setting of cigs and OCPs   .  Cardiomyopathy      echo 9/12: mild LVH, EF 35-40%, mod MR (difficult to judge /post leaflet restricted), mod BAE, mod RVE, mod TR   .  HTN (hypertension)    .  Tobacco abuse    .  Heart murmur    .  Coronary artery disease    .  Mitral regurgitation    .  Recurrent upper respiratory infection (URI)      COMPLETED 10 DAY COARSE ABX .COMPLETED 10/09/11    Past Surgical History   Procedure  Date   .  Tonsillectomy    .  Tubal ligation    .  Cesarean section    .  Cesarean section    .  Tee without cardioversion  06/30/2011     Procedure: TRANSESOPHAGEAL ECHOCARDIOGRAM (TEE); Surgeon: Pricilla Riffle, MD; Location: [facility] ENDOSCOPY; Service: Cardiovascular; Laterality: N/A;    Family History   Problem  Relation  Age of Onset   .  Stroke  Neg Hx    .  Hypertension  Mother    .  Heart disease  Father    .  COPD  Father     Social History  History   Substance Use Topics   .  Smoking status:  Former Smoker -- 1.0 packs/day for 30 years     Quit date:  05/07/2011   .  Smokeless tobacco:  Former Neurosurgeon     Quit date:  06/06/2011   .  Alcohol Use:  No    No current facility-administered medications for this encounter.    Current Outpatient Prescriptions   Medication  Sig  Dispense  Refill   .  amLODipine (NORVASC) 10 MG tablet  Take 1 tablet (10 mg total) by mouth daily.  30 tablet  11   .  aspirin EC 81 MG tablet  Take 1 tablet (81 mg total) by mouth daily.     .  carvedilol (COREG) 12.5 MG tablet  take 1 capsule by mouth twice a day WITH A MEAL.  60 tablet  4   .  famotidine (PEPCID) 20 MG tablet  Take 20 mg by mouth as needed. For indigestion     .  hydrochlorothiazide (MICROZIDE) 12.5 MG capsule  Take 1 capsule (12.5 mg total) by mouth daily.  30 capsule  4   .  lisinopril  (PRINIVIL,ZESTRIL) 20 MG tablet  take 1 tablet by mouth twice a day  60 tablet  4   .  nicotine (NICODERM CQ - DOSED IN MG/24 HOURS) 21 mg/24hr patch  Place 1 patch onto the skin daily.     .  simvastatin (ZOCOR) 20 MG tablet  Take 1 tablet (20 mg total) by mouth every evening.  30 tablet  6   .  amiodarone (PACERONE) 200 MG tablet  Take 1 tablet (200 mg total) by mouth 2 (two) times daily. Begin 7 days prior to surgery.  14 tablet  0    Allergies   Allergen  Reactions   .  Azithromycin  Nausea Only    Review of Systems: At time of consultation General: normal appetite, normal energy  Respiratory: recent cough has resolved, no wheezing, no hemoptysis, no pain with inspiration or cough, very mild shortness of breath with activity  Cardiac: no chest pain or tightness, very mild exertional SOB, no resting SOB, no PND, no orthopnea, no LE edema, no palpitations, no syncope  GI: no difficulty swallowing, no hematochezia, no hematemesis, no melena, no constipation, no diarrhea  GU: no dysuria, no urgency, no frequency  Musculoskeletal: no arthritis, no arthralgia  Vascular: no pain suggestive of claudication  Neuro: no symptoms suggestive of TIA's, no seizures, no headaches, no peripheral neuropathy  Endocrine: Negative  HEENT: The patient had dental extraction, no recent vision changes  Psych: no anxiety, no depression  Physical Exam: At time of consultation BP 158/76  Pulse 68  Resp 20  Ht 5\' 8"  (1.727 m)  Wt 182 lb (82.555 kg)  BMI 27.67 kg/m2  SpO2 100%  LMP 06/17/2011  General: well-appearing  HEENT: Unremarkable  Neck: no JVD, no bruits, no adenopathy  Chest: clear to auscultation, symmetrical breath sounds, no wheezes, no rhonchi  CV: RRR, soft grade II/VI systolic murmur  Abdomen: soft, non-tender, no masses  Extremities: warm, well-perfused, pulses non-palpable  Rectal/GU Deferred  Neuro: Grossly non-focal and symmetrical throughout  Skin: Clean and dry, no rashes, no  breakdown  Diagnostic Tests:  DATE OF PROCEDURE: 06/07/2011  CARDIAC CATHETERIZATION  HEMODYNAMIC DATA:  1. Right atrial pressure 7.  2. RV 39/7.  3. Pulmonary 38/15, mean 24.  4. Pulmonary capillary wedge 15.  5. Aortic 149/69, mean 94.  6. LV 134/12.  7. No mitral or aortic gradient.  8.  Fick cardiac output 5.51 L/minute.  9. Fick cardiac index 2.73 L/minute/m2.  10.Thermodilution cardiac output 5.7 L/minute.  11.Thermodilution cardiac index 2.8 L/minute/m2.  12.Superior vena cava saturation 67%.  13.Pulmonary artery saturation 69%.  14.Aortic saturation 93%.  ANGIOGRAPHIC DATA:  1. Ventriculography done in the RAO projection reveals what appears to  be improved overall left ventricular function. Estimated ejection  fraction at this point in time would be in the 45-50% range. There  is at least moderate mitral regurgitation noted.  2. On plain fluoroscopy, there is generalized calcification of the  coronary vessels.  3. The left main is without critical disease.  4. The left anterior descending artery courses to the apex. There is  a tiny first diagonal branch with tandem 90% lesions. Basically,  it is subtotally occluded in its midportion. The LAD beyond this  is heavily calcified but opens up into a septal and diagonal. The  second diagonal has probably 40% plaquing proximally, and then an  80% segmental lesion at the origin of the side branch. The distal  vessel is modest in size, could be potentially grafted. The LAD  proper has approximately 70-80% focal stenosis more distally to the  second diagonal takeoff, and the distal vessel has mild luminal  irregularities and wraps the apical tip. It would be a suitable  vessel for grafting.  5. The circumflex is a fairly large caliber vessel. There is ostial  narrowing of about 70% which is smooth. There is a mid stenosis  into the large bifurcating marginal about 75% which is eccentric.  6. The right coronary artery is  segmentally diseased in the proximal  mid junction with 70-80% narrowing. Distally, there is luminal  irregularity but no critical disease with a large posterior  descending and posterolateral segment.  CONCLUSIONS:  1. Improved left ventricular overall systolic function with probable  at least moderate mitral regurgitation.  2. Mild pulmonary hypertension secondary to improved left ventricular  overall systolic function with probable at least moderate mitral  regurgitation.  3. Scattered three-vessel coronary artery disease as described in the  above text.  Left and right heart catheterization performed by Dr. Riley Kill is reviewed. There is severe three-vessel coronary artery disease. There is 80% stenosis of the mid left anterior descending coronary artery after takeoff of the diagonal branch. There is 90% stenosis of the diagonal branch. There is 70% proximal stenosis of the left circumflex coronary artery with 75% stenosis of the large bifurcating obtuse marginal branch. There is right dominant Corti circulation. There is 80% stenosis of the mid right coronary artery. Left ventricular function is moderately reduced with ejection fraction estimated 40-45%. There is at least moderate mitral regurgitation. Pulmonary artery pressures measured 38/15 with pulmonary A wedge pressure 15 central venous pressure 7. Baseline cardiac output measured 5.7 L per minute corresponding to a cardiac index of 2.8.  Transesophageal Echocardiography  Patient: Ashley [name], Ashley [name] MR #: [id number] Study Date: 06/30/2011 ------------------------------------------------------------ Study Conclusions  - Mitral valve: Severe MR directed posteriorly to back of Left atrium. Backflow noted at orifice of L pulmonary veins. - Left atrium: No evidence of thrombus in the atrial cavity or appendage. - Atrial septum: No defect or patent foramen ovale was identified. ----------------------------------------------------- Left  ventricle: Normal LV systolic function. ------------------------------------------------------------ Aortic valve: Trileaflet; mildly thickened leaflets. Doppler: No regurgitation. ------------------------------------------------------------ Aorta: Mild fixed plaque in thoracic aorta. ------------------------------------------------------------ Mitral valve: Severe MR directed posteriorly to back of Left atrium. Backflow noted at orifice of L pulmonary veins. ------------------------------------------------------------ Left atrium:  No evidence of thrombus in the atrial cavity or appendage. ------------------------------------------------------------ Atrial septum: No defect or patent foramen ovale was identified. ------------------------------------------------------------ Right ventricle: Normal RV systolic function. ------------------------------------------------------------ Tricuspid valve: TV is normal. ------------------------------------------------------------ ------------------------------------------------------------ Prepared and Electronically Authenticated by  Dietrich Pates, MD 2012-11-08T19:06:41.797  Transesophageal echocardiogram performed by Dr. Tenny Craw 06/30/2011 is reviewed. This demonstrates the presence of severe mitral regurgitation. Functional anatomy is consistent with a combination of type I dysfunction and type IIIB dysfunction. Specifically, there is no evidence for mitral valve prolapse. There does appear to be some pure annular dilatation as well as some restriction of the posterior leaflet during systole. The jet of regurgitation is broad and central and courses slightly eccentrically posteriorly. There appears to be flow reversal in the pulmonary veins. There is mild tricuspid regurgitation. No other significant abnormalities are noted.    Full discussion was undertaken with the patient by Dr. Cornelius Moras and agreement was made to proceed with surgery. The patient was  admitted and on 10/21/2011 she underwent the following procedure:  Date of Procedure: 10/21/2011  Preoperative Diagnosis:  Severe 3-vessel Coronary Artery Disease  Mitral Regurgitation Postoperative Diagnosis: Same  Procedure:  Coronary Artery Bypass Grafting x 4  Left Internal Mammary Artery to Distal Left Anterior Descending Coronary Artery  Saphenous Vein Graft to Posterior Descending Coronary Artery  Saphenous Vein Graft to Obtuse Marginal Branch of Left Circumflex Coronary Artery  Saphenous Vein Graft to Diagonal Branch of the Left Anterior Descending Coronary Artery  Endoscopic Vein Harvest from Right Thigh and Lower Leg Mitral Valve Repair  Ashley [name] 3D ring annuloplasty (size 26mm, Catalog [id number], serial [id number]) Surgeon: Salvatore Decent. Cornelius Moras, MD  Assistant: Coral Ceo, PA-C  Anesthesia: Guadalupe Maple, ME  Operative Findings:  Normal left ventricular systolic function  Good quality left internal mammary artery conduit  Good quality saphenous vein conduit  Good quality target vessels for grafting  Type IIIB dysfunction with moderate mitral regurgitation  No residual mitral regurgitation following successful valve repair  She tolerated the procedure well and was taken to the surgical intensive care in stable condition.    Post operative Hospital Course:  The patient has overall progressed well. Initially she was weaned from the ventilator without significant difficulty, however, she did have some postoperative pulmonary difficulty related to secretions. Aggressive pulmonary toilet was undertaken including nebulizers. Additionally she was started on empiric antibiotics for possible pneumonia. Pulmonary medicine consultation was also obtained assisted in care. She did require bronchoscopy on 2 occasions. From this condition, however, she has made significant improvement over time. From a hemodynamic viewpoint she has remained quite stable. All routine lines, monitors, drainage  devices have been discontinued in the standard fashion. Activity as gradually increased using standard protocols. She has been started on oral Coumadin for the mitral valve repair. She has had no significant cardiac dysrhythmias. She did have a moderate postoperative volume overload which has improved over time with diuretics. She had an expected acute blood loss anemia which has stabilized and is improving. Currently her status is felt to be tentatively stable for discharge in the next 1-2 days pending ongoing reevaluation of her overall condition.  Basename 10/28/11 0620 10/27/11 0433  NA 134* 136  K 4.2 4.8  CL 98 98  CO2 28 29  GLUCOSE 113* 111*  BUN 15 18  CALCIUM 9.4 8.9    Basename 10/27/11 0433 10/26/11 0330  WBC 7.3 6.6  HGB 9.3* 8.1*  HCT 27.9* 23.7*  PLT 265 209    Basename  10/28/11 0620 10/27/11 0433  INR 1.15 1.10     Discharge Instructions:  The patient is discharged to home with extensive instructions on wound care and progressive ambulation.  They are instructed not to drive or perform any heavy lifting until returning to see the physician in his office.  Discharge Diagnosis:  CAD,MITRAL REGURGITATION  Secondary Diagnosis: Patient Active Problem List  Diagnoses  . Cardiomyopathy  . HTN (hypertension)  . Tobacco abuse  . CAD (coronary artery disease)  . Dyslipidemia  . Mitral regurgitation  . Coronary artery disease  . MR (mitral regurgitation)  . S/P mitral valve repair  . S/P CABG x 4   Past Medical History  Diagnosis Date  . Abdominal pain     ? chronic cholecystitis; admxn to [facility] 9/12 (CT, USN, HIDA done)  . Stroke     in setting of cigs and OCPs  . Cardiomyopathy     echo 9/12: mild LVH, EF 35-40%, mod MR (difficult to judge /post leaflet restricted), mod BAE, mod RVE, mod TR  . HTN (hypertension)   . Tobacco abuse   . Heart murmur   . Coronary artery disease   . Mitral regurgitation   . Recurrent upper respiratory infection (URI)      COMPLETED 10 DAY COARSE  ABX .COMPLETED 10/09/11  . 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            famotidine (PEPCID) 20 MG tablet Take 20 mg by mouth daily as needed. For indigestion           aspirin EC 81 MG tablet Take 1 tablet (81 mg total) by mouth daily.           simvastatin (ZOCOR) 20 MG tablet Take 1 tablet (20 mg total) by mouth every evening.           carvedilol (COREG) 12.5 MG tablet Take 12.5 mg by mouth 2 (two) times daily with a meal.           furosemide (LASIX) 40 MG tablet Take 1 tablet (40 mg total) by mouth daily.           guaiFENesin (MUCINEX) 600 MG 12 hr tablet Take 1 tablet (600 mg total) by mouth 2 (two) times daily.           levofloxacin (LEVAQUIN) 750 MG tablet Take 1 tablet (750 mg total) by mouth daily.           oxyCODONE (OXY IR/ROXICODONE) 5 MG immediate release tablet Take 1-2 tablets (5-10 mg total) by mouth every 4 (four) hours as needed.           potassium chloride SA (K-DUR,KLOR-CON) 20 MEQ tablet Take 1 tablet (20 mEq total) by mouth daily.           warfarin (COUMADIN) 5 MG tablet Take 1 tablet (5 mg total) by mouth daily at 6 PM. And as directed by the coumadin clinic             Disposition: For discharge home  Patient's condition is Good  Gershon Crane, PA-C 10/28/2011  11:01 AM

## 2011-10-28 NOTE — Progress Notes (Signed)
Epicardial pacing wires D/C'd, intact, per protocol.  INR 1.15.  Patient instructed to remain in bed x one hour.  Frequent VS in progress. Baird Lyons 11:25 AM

## 2011-10-28 NOTE — Progress Notes (Addendum)
[name]            [place] [address]          [phone]     7 Days Post-Op  Procedure(s) (LRB): MITRAL VALVE REPAIR (MVR) (N/A) CORONARY ARTERY BYPASS GRAFTING (CABG) (N/A) Subjective: Feels sore  Objective  Telemetry NSR  Temp:  [97.6 F (36.4 C)-99.2 F (37.3 C)] 98.1 F (36.7 C) (03/08 0642) Pulse Rate:  [61-103] 93  (03/08 0642) Resp:  [18-20] 20  (03/08 0642) BP: (112-131)/(72-88) 112/77 mmHg (03/08 0642) SpO2:  [94 %-99 %] 96 % (03/08 0642)   Intake/Output Summary (Last 24 hours) at 10/28/11 0820 Last data filed at 10/27/11 2300  Gross per 24 hour  Intake    443 ml  Output   1100 ml  Net   -657 ml       General appearance: alert and no distress Heart: regular rate and rhythm and S1, S2 normal Lungs: coarse upper airways, improves with cough Abdomen: soft, nontender, + BS Extremities: no edema Wound: incisions healing well without signs of infection  Lab Results:  Basename 10/28/11 0620 10/27/11 0433  NA 134* 136  K 4.2 4.8  CL 98 98  CO2 28 29  GLUCOSE 113* 111*  BUN 15 18  CREATININE 1.25* 1.31*  CALCIUM 9.4 8.9  MG -- --  PHOS -- --   No results found for this basename: AST:2,ALT:2,ALKPHOS:2,BILITOT:2,PROT:2,ALBUMIN:2 in the last 72 hours No results found for this basename: LIPASE:2,AMYLASE:2 in the last 72 hours  Basename 10/27/11 0433 10/26/11 0330  WBC 7.3 6.6  NEUTROABS -- --  HGB 9.3* 8.1*  HCT 27.9* 23.7*  MCV 97.6 96.0  PLT 265 209   No results found for this basename: CKTOTAL:4,CKMB:4,TROPONINI:4 in the last 72 hours No components found with this basename: POCBNP:3 No results found for this basename: DDIMER in the last 72 hours No results found for this basename: HGBA1C in the last 72 hours No results found for this basename: CHOL,HDL,LDLCALC,TRIG,CHOLHDL in the last 72 hours No results found for this basename: TSH,T4TOTAL,FREET3,T3FREE,THYROIDAB in the last 72 hours No results found  for this basename: VITAMINB12,FOLATE,FERRITIN,TIBC,IRON,RETICCTPCT in the last 72 hours  Medications: Scheduled    . aspirin EC  81 mg Oral Daily  . bisacodyl  10 mg Oral Daily   Or  . bisacodyl  10 mg Rectal Daily  . carvedilol  12.5 mg Oral BID WC  . docusate sodium  200 mg Oral Daily  . furosemide  40 mg Oral Daily  . guaiFENesin  600 mg Oral BID  . insulin aspart  0-24 Units Subcutaneous TID AC & HS  . levofloxacin  750 mg Oral Daily  . nicotine  14 mg Transdermal Q24H  . pantoprazole  40 mg Oral Q1200  . potassium chloride  20 mEq Oral Daily  . simvastatin  20 mg Oral QPM  . sodium chloride  3 mL Intravenous Q12H  . warfarin  5 mg Oral q1800  . Warfarin - Physician Dosing Inpatient   Does not apply q1800     Radiology/Studies:  Dg Chest 2 View  10/27/2011  *RADIOLOGY REPORT*  Clinical Data: Left lung consolidation and atelectasis, follow-up  CHEST - 2 VIEW  Comparison: 10/26/2011  Findings: Enlargement of cardiac silhouette post CABG and MVR. Atherosclerotic calcification aortic arch. Pulmonary vascularity normal. Persistent infiltrate left mid lung. Remaining lungs clear. Small bibasilar  effusions. Question underlying emphysematous changes. No pneumothorax.  IMPRESSION: Enlargement of cardiac silhouette post CABG and MVR. Mild persistent infiltrate left mid lung with small bibasilar effusions.  Original Report Authenticated By: Lollie Marrow, M.D.    INR:1.15 Will add last result for INR, ABG once components are confirmed Will add last 4 CBG results once components are confirmed  Assessment/Plan: S/P Procedure(s) (LRB): MITRAL VALVE REPAIR (MVR) (N/A) CORONARY ARTERY BYPASS GRAFTING (CABG) (N/A)   1. conts to make excellent progress 2. Push pulm toilet/cardiac rehab as able 3. Creat/ H+H improved 4. Cont AC RX 5. CBG well controlled 6. Cont levaquin 7. Cont gentle diuresis  LOS: 7 days    Ashley [name],Ashley [name] 3/8/20138:20 AM    I have seen and examined the patient  and agree with the assessment and plan as outlined.  Tentatively for d/c in am.  Rewa Weissberg H 10/28/2011 1:54 PM

## 2011-10-29 LAB — PROTIME-INR
INR: 1.37 (ref 0.00–1.49)
Prothrombin Time: 17.1 seconds — ABNORMAL HIGH (ref 11.6–15.2)

## 2011-10-29 LAB — GLUCOSE, CAPILLARY
Glucose-Capillary: 104 mg/dL — ABNORMAL HIGH (ref 70–99)
Glucose-Capillary: 110 mg/dL — ABNORMAL HIGH (ref 70–99)

## 2011-10-29 NOTE — Progress Notes (Signed)
Pt. Discharged 10/29/2011  12:32 PM Discharge instructions reviewed with patient/family. Patient/family verbalized understanding. All Rx's given. Questions answered as needed. Pt. Discharged to home with family/self.  Appollonia Klee

## 2011-10-29 NOTE — Progress Notes (Addendum)
[address]            [place] [address]          [phone]     8 Days Post-Op  Procedure(s) (LRB): MITRAL VALVE REPAIR (MVR) (N/A) CORONARY ARTERY BYPASS GRAFTING (CABG) (N/A) Subjective: Continues to feel better, progressing nicely with ambulation  Objective  Telemetry NSR  Temp:  [97.2 F (36.2 C)-99.4 F (37.4 C)] 97.2 F (36.2 C) (03/09 0448) Pulse Rate:  [84-86] 86  (03/09 0448) Resp:  [20] 20  (03/09 0448) BP: (106-123)/(65-73) 106/69 mmHg (03/09 0448) SpO2:  [95 %] 95 % (03/09 0448) Weight:  [182 lb (82.555 kg)] 182 lb (82.555 kg) (03/09 0456)   Intake/Output Summary (Last 24 hours) at 10/29/11 0745 Last data filed at 10/28/11 0900  Gross per 24 hour  Intake      0 ml  Output      1 ml  Net     -1 ml       General appearance: alert and no distress Heart: regular rate and rhythm and S1, S2 normal Lungs: mildly diminished L>R base, otherwise clear Abdomen: soft, non-tender Extremities: minor LE edema Wound: incisions healing well  Lab Results:  Basename 10/28/11 0620 10/27/11 0433  NA 134* 136  K 4.2 4.8  CL 98 98  CO2 28 29  GLUCOSE 113* 111*  BUN 15 18  CREATININE 1.25* 1.31*  CALCIUM 9.4 8.9  MG -- --  PHOS -- --   No results found for this basename: AST:2,ALT:2,ALKPHOS:2,BILITOT:2,PROT:2,ALBUMIN:2 in the last 72 hours No results found for this basename: LIPASE:2,AMYLASE:2 in the last 72 hours  Basename 10/27/11 0433  WBC 7.3  NEUTROABS --  HGB 9.3*  HCT 27.9*  MCV 97.6  PLT 265   No results found for this basename: CKTOTAL:4,CKMB:4,TROPONINI:4 in the last 72 hours No components found with this basename: POCBNP:3 No results found for this basename: DDIMER in the last 72 hours No results found for this basename: HGBA1C in the last 72 hours No results found for this basename: CHOL,HDL,LDLCALC,TRIG,CHOLHDL in the last 72 hours No results found for this basename: TSH,T4TOTAL,FREET3,T3FREE,THYROIDAB  in the last 72 hours No results found for this basename: VITAMINB12,FOLATE,FERRITIN,TIBC,IRON,RETICCTPCT in the last 72 hours  Medications: Scheduled    . aspirin EC  81 mg Oral Daily  . bisacodyl  10 mg Oral Daily   Or  . bisacodyl  10 mg Rectal Daily  . carvedilol  12.5 mg Oral BID WC  . docusate sodium  200 mg Oral Daily  . furosemide  40 mg Oral Daily  . guaiFENesin  600 mg Oral BID  . insulin aspart  0-24 Units Subcutaneous TID AC & HS  . levofloxacin  750 mg Oral Daily  . nicotine  14 mg Transdermal Q24H  . pantoprazole  40 mg Oral Q1200  . potassium chloride  20 mEq Oral Daily  . simvastatin  20 mg Oral QPM  . sodium chloride  3 mL Intravenous Q12H  . warfarin  5 mg Oral q1800  . Warfarin - Physician Dosing Inpatient   Does not apply q1800     Radiology/Studies:  No results found.  INR: Will add last result for INR, ABG once components are confirmed Will add last 4 CBG results once components are confirmed  Assessment/Plan: S/P Procedure(s) (LRB): MITRAL VALVE REPAIR (MVR) (N/A) CORONARY ARTERY BYPASS GRAFTING (CABG) (N/A)  Plan for discharge: see discharge orders She is doing very well  LOS: 8 days    GOLD,WAYNE E 3/9/20137:45 AM    I have seen and examined the patient and agree with the assessment and plan as outlined.  Kris Burd H 10/29/2011 10:24 AM

## 2011-10-31 LAB — CULTURE, BLOOD (ROUTINE X 2)
Culture  Setup Time: 201303051449
Culture: NO GROWTH

## 2011-10-31 LAB — LEGIONELLA PROFILE(CULTURE+DFA/SMEAR)

## 2011-11-01 ENCOUNTER — Ambulatory Visit (INDEPENDENT_AMBULATORY_CARE_PROVIDER_SITE_OTHER): Payer: Medicaid Other

## 2011-11-01 DIAGNOSIS — Z9889 Other specified postprocedural states: Secondary | ICD-10-CM

## 2011-11-01 DIAGNOSIS — I340 Nonrheumatic mitral (valve) insufficiency: Secondary | ICD-10-CM

## 2011-11-01 DIAGNOSIS — I059 Rheumatic mitral valve disease, unspecified: Secondary | ICD-10-CM

## 2011-11-01 DIAGNOSIS — Z7901 Long term (current) use of anticoagulants: Secondary | ICD-10-CM

## 2011-11-01 NOTE — Patient Instructions (Signed)

## 2011-11-07 ENCOUNTER — Ambulatory Visit (INDEPENDENT_AMBULATORY_CARE_PROVIDER_SITE_OTHER): Payer: Medicaid Other

## 2011-11-07 DIAGNOSIS — Z9889 Other specified postprocedural states: Secondary | ICD-10-CM

## 2011-11-07 DIAGNOSIS — I340 Nonrheumatic mitral (valve) insufficiency: Secondary | ICD-10-CM

## 2011-11-07 DIAGNOSIS — I059 Rheumatic mitral valve disease, unspecified: Secondary | ICD-10-CM

## 2011-11-07 DIAGNOSIS — Z7901 Long term (current) use of anticoagulants: Secondary | ICD-10-CM

## 2011-11-11 ENCOUNTER — Encounter: Payer: Self-pay | Admitting: Physician Assistant

## 2011-11-11 ENCOUNTER — Encounter: Payer: Self-pay | Admitting: *Deleted

## 2011-11-11 ENCOUNTER — Telehealth: Payer: Self-pay | Admitting: *Deleted

## 2011-11-11 ENCOUNTER — Ambulatory Visit (INDEPENDENT_AMBULATORY_CARE_PROVIDER_SITE_OTHER): Payer: Medicaid Other | Admitting: Physician Assistant

## 2011-11-11 VITALS — BP 96/64 | HR 74 | Ht 68.0 in | Wt 177.0 lb

## 2011-11-11 DIAGNOSIS — I428 Other cardiomyopathies: Secondary | ICD-10-CM

## 2011-11-11 DIAGNOSIS — I059 Rheumatic mitral valve disease, unspecified: Secondary | ICD-10-CM

## 2011-11-11 DIAGNOSIS — E785 Hyperlipidemia, unspecified: Secondary | ICD-10-CM

## 2011-11-11 DIAGNOSIS — I340 Nonrheumatic mitral (valve) insufficiency: Secondary | ICD-10-CM

## 2011-11-11 DIAGNOSIS — I2510 Atherosclerotic heart disease of native coronary artery without angina pectoris: Secondary | ICD-10-CM

## 2011-11-11 DIAGNOSIS — I10 Essential (primary) hypertension: Secondary | ICD-10-CM

## 2011-11-11 LAB — BASIC METABOLIC PANEL
BUN: 14 mg/dL (ref 6–23)
Chloride: 101 mEq/L (ref 96–112)
Glucose, Bld: 99 mg/dL (ref 70–99)
Potassium: 4 mEq/L (ref 3.5–5.1)

## 2011-11-11 NOTE — Telephone Encounter (Signed)
pt notified of lab results and to increase fluid intake and to decrease coreg to 6.25 mg bid, repeat bmet 11/17/11 @ [place] office. Danielle Rankin

## 2011-11-11 NOTE — Patient Instructions (Signed)
Your physician recommends that you schedule a follow-up appointment in: 6 WEEKS WITH DR. ROSS  You have been referred to CARDIAC REHAB @ [place] DX 414.01, [phone]  Your physician recommends that you return for lab work in: TODAY BMET  Your physician has requested that you have an echocardiogram DX 424.0. Echocardiography is a painless test that uses sound waves to create images of your heart. It provides your doctor with information about the size and shape of your heart and how well your heart's chambers and valves are working. This procedure takes approximately one hour. There are no restrictions for this procedure.

## 2011-11-11 NOTE — Telephone Encounter (Signed)
Message copied by Tarri Fuller on Fri Nov 11, 2011  3:55 PM ------      Message from: [place], [place] T      Created: Fri Nov 11, 2011  2:40 PM       Creatinine up.      BP was low today - may be worse from low BP      Decrease Coreg to 6.25 mg bid      Push po fluids      Repeat BMET in one week      Tereso Newcomer, PA-C  2:40 PM 11/11/2011

## 2011-11-11 NOTE — Progress Notes (Signed)
[address] [place], [place]  [address] Phone: [phone] Fax:  [phone]  Date:  11/11/2011   Name:  Ashley [name]       DOB:  May 05, 1966 MRN:  [id number]  Primary Cardiologist:  Dr. Dietrich Pates  Primary Electrophysiologist:  None    History of Present Illness: Ashley [name] is a 46 y.o. female who presents for post hospital follow up.  She has a history of mitral valve prolapse, prior stroke in the setting of OCPs and hypertension.  She was noted to have a dilated cardiomyopathy in 04/2011 when she was admitted with abdominal pain thought to be related to chronic cholecystitis.  LHC in 05/2011 demonstrated three-vessel CAD.  TEE demonstrated severe mitral regurgitation.  She was eventually brought in for bypass surgery and mitral valve repair.  This was performed by Dr. Cornelius Moras on 10/21/11: LIMA-LAD, SVG-PDA, SVG-OM, SVG-diagonal.  A Sorin Memo 3D (26mm) ring annuloplasty was used for her mitral valve repair.  Intra-operative TEE demonstrated normal LV function and no residual MR.  Postoperatively, she required antibiotics for possible pneumonia and bronchoscopy x2.  Coumadin was initiated.  This has been followed in our clinic since discharge.  Last INR 3/18 was 2.7.  Labs: Potassium 4.2, creatinine 1.25, Hemoglobin 9.3.    Overall doing well.  Walking 3 x per day.  Breathing is good.  No orthopnea, PND or edema.  Reviewed weights and BPs.  Weights are down.  BP is good.  No syncope, dizziness, orthostasis.  Chest is sore.  No fever, cough.  Finishes abxs in 2 days.    Past Medical History  Diagnosis Date  . Abdominal pain     ? chronic cholecystitis; admxn to [facility] 9/12 (CT, USN, HIDA done)  . Stroke     in setting of cigs and OCPs  . Cardiomyopathy     echo 9/12: mild LVH, EF 35-40%, mod MR (difficult to judge /post leaflet restricted), mod BAE, mod RVE, mod TR  . HTN (hypertension)   . Tobacco abuse   . Heart murmur   . Coronary artery disease   .  Mitral regurgitation   . Recurrent upper respiratory infection (URI)     COMPLETED 10 DAY COARSE  ABX .COMPLETED 10/09/11  . 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 aspirin EC 81 MG tablet Take 1 tablet (81 mg total) by mouth daily.      . carvedilol (COREG) 12.5 MG tablet Take 12.5 mg by mouth 2 (two) times daily with a meal.      . famotidine (PEPCID) 20 MG tablet Take 20 mg by mouth daily as needed. For indigestion      . guaiFENesin (MUCINEX) 600 MG 12 hr tablet Take 1 tablet (600 mg total) by mouth 2 (two) times daily.  30 tablet  0  . nicotine (NICODERM CQ - DOSED IN MG/24 HOURS) 21 mg/24hr patch Place 1 patch onto the skin daily.       [name] oxyCODONE (ROXICODONE) 5 MG immediate release tablet Take 5 mg by mouth 2 (two) times daily as needed.      . simvastatin (ZOCOR) 20 MG tablet Take 1 tablet (20 mg total) by mouth every evening.  30 tablet  6  . warfarin (COUMADIN) 5 MG tablet Take 1 tablet (5 mg total) by mouth daily at 6 PM. And as directed by the coumadin  clinic  60 tablet  1    Allergies: Allergies  Allergen Reactions  . Azithromycin Nausea Only    History  Substance Use Topics  . Smoking status: Former Smoker -- 1.0 packs/day for 30 years    Quit date: 05/07/2011  . Smokeless tobacco: Former Neurosurgeon    Quit date: 06/06/2011  . Alcohol Use: No     ROS:  Please see the history of present illness.    All other systems reviewed and negative.   PHYSICAL EXAM: VS:  BP 96/64  Pulse 74  Ht 5\' 8"  (1.727 m)  Wt 177 lb (80.287 kg)  BMI 26.91 kg/m2 Well nourished, well developed, in no acute distress HEENT: normal Neck: no JVD Cardiac:  normal S1, S2; RRR; no murmur Lungs:  clear to auscultation bilaterally, no wheezing, rhonchi or rales Abd: soft, nontender, no hepatomegaly Ext: no edema Skin: warm and dry Neuro:  CNs 2-12 intact, no focal abnormalities noted  EKG:  Sinus rhythm,  heart rate, 74, normal axis, nonspecific ST-T wave changes  ASSESSMENT AND PLAN:  1. CAD (coronary artery disease)  Doing well post bypass.  Refer to cardiac rehabilitation.  She sees the surgeon in a couple of weeks.  Continue aspirin and statin.  Followup with Dr. Tenny Craw in 6 weeks.   2. Cardiomyopathy  She is no longer on an ACE due to low blood pressures.  Intraoperative echocardiogram demonstrated normal LV function.  She will be set up for a 2-D echocardiogram today to get a baseline reading of her mitral valve repair and reassess her LV function.  In followup, if her blood pressure is higher, we will try to reinitiate low-dose ACE inhibitor.  Currently, her volume is stable.  Check a basic metabolic panel today.   3. Mitral regurgitation  Status post repair.  Get follow up echo.  She is currently on Coumadin.   4. Dyslipidemia  Continue simvastatin.   5. HTN (hypertension)  Controlled.  Blood pressure too soft to reinitiate ACE inhibitor.     Signed, Tereso Newcomer, PA-C  9:17 AM 11/11/2011

## 2011-11-14 ENCOUNTER — Ambulatory Visit (INDEPENDENT_AMBULATORY_CARE_PROVIDER_SITE_OTHER): Payer: Medicaid Other | Admitting: *Deleted

## 2011-11-14 DIAGNOSIS — Z9889 Other specified postprocedural states: Secondary | ICD-10-CM

## 2011-11-14 DIAGNOSIS — I059 Rheumatic mitral valve disease, unspecified: Secondary | ICD-10-CM

## 2011-11-14 DIAGNOSIS — Z7901 Long term (current) use of anticoagulants: Secondary | ICD-10-CM

## 2011-11-14 DIAGNOSIS — I340 Nonrheumatic mitral (valve) insufficiency: Secondary | ICD-10-CM

## 2011-11-17 ENCOUNTER — Other Ambulatory Visit (INDEPENDENT_AMBULATORY_CARE_PROVIDER_SITE_OTHER): Payer: Medicaid Other

## 2011-11-17 ENCOUNTER — Other Ambulatory Visit: Payer: Self-pay

## 2011-11-17 DIAGNOSIS — I428 Other cardiomyopathies: Secondary | ICD-10-CM

## 2011-11-17 LAB — BASIC METABOLIC PANEL
BUN: 12 mg/dL (ref 6–23)
CO2: 28 mEq/L (ref 19–32)
Chloride: 101 mEq/L (ref 96–112)
Creatinine, Ser: 1.2 mg/dL (ref 0.4–1.2)

## 2011-11-18 ENCOUNTER — Telehealth: Payer: Self-pay | Admitting: *Deleted

## 2011-11-18 NOTE — Telephone Encounter (Signed)
Message copied by Tarri Fuller on Fri Nov 18, 2011 12:12 PM ------      Message from: [place], [place] T      Created: Thu Nov 17, 2011  5:44 PM       Creatinine better.      Tereso Newcomer, PA-C  5:44 PM 11/17/2011

## 2011-11-18 NOTE — Telephone Encounter (Signed)
pt notified of lab results. pt states to me that BP has been elevated since coreg decreased to 6.25 bid, see documentation for readings  3/26th 7:30 am 133/76  78 3/27th 7:30 am 139/82  79 3/28th 7:30 am 134-70  78, pt states took bp again @ lunch time and was 150/83, states she only has 1/2 cup of coffee which is 1/2 and 1/2, states does not smoke and she watches her Na+, states she drinks soda but says they are all caffeine free 3/29th 7:30 am 119/73  79  I advised pt that I will let Tereso Newcomer, PA-C know of these readings and that someone from our office will cb on Monday 11/21/11 if he was going to make any changes ; however pt will be in office on 4/1 Monday for her echo @ 7:30 am

## 2011-11-21 ENCOUNTER — Other Ambulatory Visit: Payer: Self-pay

## 2011-11-21 ENCOUNTER — Telehealth: Payer: Self-pay | Admitting: *Deleted

## 2011-11-21 ENCOUNTER — Ambulatory Visit (HOSPITAL_COMMUNITY): Payer: Medicaid Other | Attending: Cardiology

## 2011-11-21 DIAGNOSIS — E785 Hyperlipidemia, unspecified: Secondary | ICD-10-CM | POA: Insufficient documentation

## 2011-11-21 DIAGNOSIS — R0989 Other specified symptoms and signs involving the circulatory and respiratory systems: Secondary | ICD-10-CM | POA: Insufficient documentation

## 2011-11-21 DIAGNOSIS — I079 Rheumatic tricuspid valve disease, unspecified: Secondary | ICD-10-CM | POA: Insufficient documentation

## 2011-11-21 DIAGNOSIS — R0609 Other forms of dyspnea: Secondary | ICD-10-CM | POA: Insufficient documentation

## 2011-11-21 DIAGNOSIS — I2510 Atherosclerotic heart disease of native coronary artery without angina pectoris: Secondary | ICD-10-CM | POA: Insufficient documentation

## 2011-11-21 DIAGNOSIS — I059 Rheumatic mitral valve disease, unspecified: Secondary | ICD-10-CM

## 2011-11-21 DIAGNOSIS — I340 Nonrheumatic mitral (valve) insufficiency: Secondary | ICD-10-CM

## 2011-11-21 DIAGNOSIS — I10 Essential (primary) hypertension: Secondary | ICD-10-CM | POA: Insufficient documentation

## 2011-11-21 MED ORDER — CARVEDILOL 6.25 MG PO TABS: 9.3750 mg | ORAL_TABLET | Freq: Two times a day (BID) | ORAL | Status: DC

## 2011-11-21 NOTE — Telephone Encounter (Signed)
Message copied by Tarri Fuller on Mon Nov 21, 2011  5:16 PM ------      Message from: [place], [place] T      Created: Mon Nov 21, 2011  2:31 PM       Normal EF      MV repair ok      Tereso Newcomer, PA-C  2:31 PM 11/21/2011

## 2011-11-21 NOTE — Telephone Encounter (Signed)
forgot to give echo results when I s/w pt a few minutes ago. pt njow notified of echo and gave verbal understanding. Danielle Rankin

## 2011-11-21 NOTE — Telephone Encounter (Signed)
pt notified of lab results and to increase coreg to 9.375 mg bid, rx sent in today. Danielle Rankin

## 2011-11-21 NOTE — Telephone Encounter (Signed)
Message copied by Tarri Fuller on Mon Nov 21, 2011  5:12 PM ------      Message from: [place], [place] T      Created: Mon Nov 21, 2011  2:46 PM       Increase coreg to 9.375 mg bid      Call if BPs remain elevated      Tereso Newcomer, PA-C  2:46 PM 11/21/2011

## 2011-11-22 ENCOUNTER — Encounter (HOSPITAL_COMMUNITY): Payer: Self-pay | Admitting: Dentistry

## 2011-11-22 ENCOUNTER — Other Ambulatory Visit: Payer: Self-pay | Admitting: Thoracic Surgery (Cardiothoracic Vascular Surgery)

## 2011-11-22 ENCOUNTER — Ambulatory Visit (HOSPITAL_COMMUNITY): Payer: Self-pay | Admitting: Dentistry

## 2011-11-22 VITALS — BP 152/82 | HR 73 | Temp 97.4°F

## 2011-11-22 DIAGNOSIS — K08109 Complete loss of teeth, unspecified cause, unspecified class: Secondary | ICD-10-CM

## 2011-11-22 DIAGNOSIS — I2510 Atherosclerotic heart disease of native coronary artery without angina pectoris: Secondary | ICD-10-CM

## 2011-11-22 DIAGNOSIS — Z463 Encounter for fitting and adjustment of dental prosthetic device: Secondary | ICD-10-CM

## 2011-11-22 NOTE — Progress Notes (Signed)
Tuesday, November 22, 2011   BP: 157/82             P:  73             T: 97.4   Ashley [name] presents for start of upper and lower complete denture fabrication. Exam: Patient is edentulous.  Discussed procedures involved in upper and lower complete denture fabrication and prognosis for successful ability to wear dentures. Price for dentures confirmed. Medicaid prior approval has been sent. Patient is aware that she will be responsible for payment if Medicaid prior approval is not obtained. Patient agrees to proceed with upper and lower complete denture fabrication-today. Procedure: Upper and lower complete denture primary impressions in alginate. Lab pour. To [facility] for custom impression tray fabrication. RTC for upper and lower complete denture final impressions. Dr. Cindra Eves

## 2011-11-24 ENCOUNTER — Encounter (HOSPITAL_COMMUNITY)
Admission: RE | Admit: 2011-11-24 | Discharge: 2011-11-24 | Disposition: A | Discharge status: Home / Self Care | Payer: Medicaid Other | Source: Ambulatory Visit | Attending: Internal Medicine | Admitting: Internal Medicine

## 2011-11-24 ENCOUNTER — Ambulatory Visit (INDEPENDENT_AMBULATORY_CARE_PROVIDER_SITE_OTHER): Payer: Medicaid Other | Admitting: *Deleted

## 2011-11-24 DIAGNOSIS — I059 Rheumatic mitral valve disease, unspecified: Secondary | ICD-10-CM

## 2011-11-24 DIAGNOSIS — Z7901 Long term (current) use of anticoagulants: Secondary | ICD-10-CM

## 2011-11-24 DIAGNOSIS — I340 Nonrheumatic mitral (valve) insufficiency: Secondary | ICD-10-CM

## 2011-11-24 DIAGNOSIS — Z9889 Other specified postprocedural states: Secondary | ICD-10-CM

## 2011-11-24 NOTE — Progress Notes (Signed)
Cardiac Rehab Medication Review by a Pharmacist  Does the patient  feel that his/her medications are working for him/her?  yes  Has the patient been experiencing any side effects to the medications prescribed?  no  Does the patient measure his/her own blood pressure or blood glucose at home?  yes   Does the patient have any problems obtaining medications due to transportation or finances?   no  Understanding of regimen: good Understanding of indications: good Potential of compliance: good    Pharmacist comments:     Ival Bible 11/24/2011 8:53 AM

## 2011-11-25 LAB — FUNGUS CULTURE W SMEAR: Fungal Smear: NONE SEEN

## 2011-11-28 ENCOUNTER — Encounter: Payer: Self-pay | Admitting: Thoracic Surgery (Cardiothoracic Vascular Surgery)

## 2011-11-28 ENCOUNTER — Ambulatory Visit (INDEPENDENT_AMBULATORY_CARE_PROVIDER_SITE_OTHER): Payer: Self-pay | Admitting: Thoracic Surgery (Cardiothoracic Vascular Surgery)

## 2011-11-28 ENCOUNTER — Telehealth: Payer: Self-pay | Admitting: *Deleted

## 2011-11-28 ENCOUNTER — Ambulatory Visit
Admission: RE | Admit: 2011-11-28 | Discharge: 2011-11-28 | Disposition: A | Discharge status: Home / Self Care | Payer: Medicaid Other | Source: Ambulatory Visit | Attending: Thoracic Surgery (Cardiothoracic Vascular Surgery) | Admitting: Thoracic Surgery (Cardiothoracic Vascular Surgery)

## 2011-11-28 VITALS — BP 159/91 | HR 78 | Temp 97.4°F | Resp 16 | Ht 68.0 in | Wt 176.0 lb

## 2011-11-28 DIAGNOSIS — Z9889 Other specified postprocedural states: Secondary | ICD-10-CM

## 2011-11-28 DIAGNOSIS — I2510 Atherosclerotic heart disease of native coronary artery without angina pectoris: Secondary | ICD-10-CM

## 2011-11-28 DIAGNOSIS — Z951 Presence of aortocoronary bypass graft: Secondary | ICD-10-CM

## 2011-11-28 DIAGNOSIS — I10 Essential (primary) hypertension: Secondary | ICD-10-CM

## 2011-11-28 MED ORDER — LISINOPRIL 5 MG PO TABS: 5.0000 mg | ORAL_TABLET | Freq: Every day | ORAL | Status: DC

## 2011-11-28 NOTE — Telephone Encounter (Signed)
Message copied by Tarri Fuller on Mon Nov 28, 2011  4:06 PM ------      Message from: [place], [place] T      Created: Mon Nov 28, 2011  3:33 PM      Regarding: Lisinopril       Patient seen by Dr. Cornelius Moras today and BP running high.      He put her back on Lisinopril      Have patient return for labwork (BMET) and BP check one week after starting on Lisinopril.      Thanks      [place], [place]  3:34 PM 11/28/2011

## 2011-11-28 NOTE — Telephone Encounter (Signed)
lmom pt needs to have bmet and BP check since Dr. Wyline Beady restarted pt back on lisinopril today, these orders are from [facility], [facility], orders and appts made today and lmom for pt about orders and appt 4/16 for nurse visit BP check and same day bmet. lmom for pt that if this date did not work then to please call office to schedule another day. Danielle Rankin

## 2011-11-28 NOTE — Patient Instructions (Signed)
The patient has been reminded to continue to avoid any heavy lifting or strenuous use of arms or shoulders for at least a total of three months from the time of surgery. The patient has been instructed that they may return driving an automobile as long as they are no longer requiring oral narcotic pain relievers during the daytime.  They have been advised to start driving short distances during the daylight and gradually increase from there as they feel comfortable.  

## 2011-11-28 NOTE — Progress Notes (Signed)
[address]            Ashley [name] [address]          [phone]     CARDIOTHORACIC SURGERY OFFICE NOTE  Referring Provider is Ashley Riffle, MD PCP is Ashley Pates, MD, MD   HPI:  Patient returns for routine followup status post mitral valve repair and coronary artery bypass grafting x4 on 10/21/2011. Her postoperative recovery immediately following surgery was notable for some lobar collapse related to tracheobronchitis with mucous plugging that required therapeutic bronchoscopy. She was treated with a course of oral antibiotics. She otherwise did quite well and was discharged from the hospital uneventfully. Since hospital discharge she has had her Coumadin dose adjusted through the [facility].  She has been seen in followup by Ashley [name] and her dose of carvedilol has been adjusted because of hypertension. She was not restarted on an ACE inhibitor immediately following surgery because her blood pressure was running somewhat low. She returns to the office today for routine followup. Overall she reports doing exceptionally well. She has minimal residual soreness in her chest and she is no longer taking any sort of pain relievers other than Tylenol. Her appetite is good. She is sleeping fairly well at night. She has no shortness of breath. She is not smoking.   Current Outpatient Prescriptions  Medication Sig Dispense Refill  . aspirin EC 81 MG tablet Take 1 tablet (81 mg total) by mouth daily.      . carvedilol (COREG) 12.5 MG tablet Take 18.75 mg by mouth 2 (two) times daily with a meal.      . famotidine (PEPCID) 20 MG tablet Take 20 mg by mouth daily as needed. For indigestion      . nicotine (NICODERM CQ - DOSED IN MG/24 HOURS) 21 mg/24hr patch Place 1 patch onto the skin daily.       . simvastatin (ZOCOR) 20 MG tablet Take 1 tablet (20 mg total) by mouth every evening.  30 tablet  6  . warfarin (COUMADIN) 5 MG tablet Take 1 tablet everyday,  except 1/2 tablet on Tuesday & Saturday (as of 11/2011)      . guaiFENesin (MUCINEX) 600 MG 12 hr tablet Take 1 tablet (600 mg total) by mouth 2 (two) times daily.  30 tablet  0  . lisinopril (PRINIVIL,ZESTRIL) 5 MG tablet Take 1 tablet (5 mg total) by mouth daily.  30 tablet  11  . DISCONTD: amiodarone (PACERONE) 200 MG tablet Take 200 mg by mouth 2 (two) times daily. Begin 7 days prior to surgery.      [name] DISCONTD: amLODipine (NORVASC) 10 MG tablet Take 1 tablet (10 mg total) by mouth daily.  30 tablet  11  . DISCONTD: furosemide (LASIX) 40 MG tablet Take 1 tablet (40 mg total) by mouth daily.  5 tablet  0  . DISCONTD: potassium chloride SA (K-DUR,KLOR-CON) 20 MEQ tablet Take 1 tablet (20 mEq total) by mouth daily.  5 tablet  0      Physical Exam:   BP 159/91  Pulse 78  Temp 97.4 F (36.3 C)  Resp 16  Ht 5\' 8"  (1.727 m)  Wt 176 lb (79.833 kg)  BMI 26.76 kg/m2  SpO2 100%  LMP 11/28/2011  General:  Well-appearing  Chest:   Clear to auscultation with symmetrical breath sounds  CV:   Regular rate  and rhythm without murmur  Incisions:  Clean and dry and healing nicely  Abdomen:  Soft and nontender  Extremities:  Warm and well-perfused with no lower extremity edema  Diagnostic Tests:  *RADIOLOGY REPORT*  Clinical Data: Chest soreness.  CHEST - 2 VIEW 11/28/2011 Comparison: 10/27/2011.  Findings: Trachea is midline. Heart is mildly enlarged, stable.  Sternotomy wires are stable in position. Linear atelectasis or  developing scarring in the left mid lung zone. Lungs are otherwise  clear. There may be pleural thickening at the left costophrenic  angle.  IMPRESSION:  Linear atelectasis or developing scarring in the left mid lung  zone.  Original Report Authenticated By: Ashley [name], M.D.   Impression:  Ashley [name] is doing exceptionally well just over one month following mitral valve repair and coronary artery bypass grafting. Her tracheobronchitis resolved uneventfully and her  chest x-rays clear. Her activity level is quite good and she plans to start the cardiac rehabilitation program next week. Her blood pressure has been running somewhat on the high side.  Plan:  We will start Aerin on lisinopril 5 mg daily. She will otherwise remain on all of her current medications without change. I've encouraged her to increase her physical activity as tolerated 5 reminded her to remain from any heavy lifting or strenuous use of arms or shoulders for least another 2 months. I think she can resume driving an automobile. All of her questions been addressed. She plans to followup with Ashley [name] later this month. We will plan to see her back in 3 months for routine followup.   Ashley [name]. Ashley Moras, MD 11/28/2011 3:17 PM

## 2011-11-30 NOTE — Progress Notes (Signed)
Ashley [name] 46 y.o. female       Nutrition Screen                                                                    YES  NO Do you live in a nursing home?  X   Do you eat out more than 3 times/week?    X If yes, how many times per week do you eat out?  Do you have food allergies?  X  If yes, what are you allergic to? Cooked spinach  Have you gained or lost more than 10 lbs without trying?               X If yes, how much weight have you lost and over what time period?  lbs gained or lost over  weeks/month  Do you want to lose weight?    X  If yes, what is a goal weight or amount of weight you would like to lose? 25 lb  Do you eat alone most of the time?   X   Do you eat less than 2 meals/day?  X If yes, how many meals do you eat?  Do you drink more than 3 alcohol drinks/day?  X If yes, how many drinks per day?  Are you having trouble with constipation? *  X If yes, what are you doing to help relieve constipation?  Do you have financial difficulties with buying food?*    X   Are you experiencing regular nausea/ vomiting?*     X   Do you have a poor appetite? *                                        X   Do you have trouble chewing/swallowing? *   X    Pt with diagnoses of:  X CABG              X AVR/MVR/ICD      X %  Body fat >goal / Body Mass Index >25 X HTN / BP >120/80       Pt Risk Score   2       Diagnosis Risk Score  20       Total Risk Score   22                         High Risk               X Low Risk    HT: 68" Ht Readings from Last 1 Encounters:  11/28/11 5\' 8"  (1.727 m)    WT:   191.2 lb (86.9 kg) Wt Readings from Last 3 Encounters:  11/28/11 176 lb (79.833 kg)  11/24/11 182 lb 12.2 oz (82.9 kg)  11/11/11 177 lb (80.287 kg)     IBW 63.6 137%IBW BMI 29.1 37.8%body fat  Meds reviewed: Coumadin Past Medical History  Diagnosis Date  . Abdominal pain     ? chronic cholecystitis; admxn to [facility] 9/12 (CT, USN, HIDA done)  . Stroke     in setting of cigs and  OCPs  .  Cardiomyopathy     echo 9/12: mild LVH, EF 35-40%, mod MR (difficult to judge /post leaflet restricted), mod BAE, mod RVE, mod TR  . HTN (hypertension)   . Tobacco abuse   . Heart murmur   . Coronary artery disease   . Mitral regurgitation   . Recurrent upper respiratory infection (URI)     COMPLETED 10 DAY COARSE  ABX .COMPLETED 10/09/11  . 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 No     Pt quit tobacco use on 05/07/11 Food/Drug Interaction? Yes      If Y, which med(s)? Coumadin If yes, pt given Food/Drug Interaction handout? Yes Labs:  Lipid Panel     Component Value Date/Time   CHOL 139 09/15/2011 0935   TRIG 133.0 09/15/2011 0935   HDL 41.10 09/15/2011 0935   CHOLHDL 3 09/15/2011 0935   VLDL 26.6 09/15/2011 0935   LDLCALC 71 09/15/2011 0935    Lab Results  Component Value Date   HGBA1C 5.2 10/10/2011   11/17/11 Glucose 107  LDL goal: < 100      MI, DM, Carotid or PVD and > 2:      HTN, family h/o,  Estimated Daily Nutrition Needs for: ? wt loss  1350-1850 Kcal , Total Fat 35-50gm, Saturated Fat 10-14 gm, Trans Fat 1.3-1.8 gm,  Sodium less than 1500 mg

## 2011-12-02 ENCOUNTER — Ambulatory Visit (INDEPENDENT_AMBULATORY_CARE_PROVIDER_SITE_OTHER): Payer: Medicaid Other | Admitting: Pharmacist

## 2011-12-02 DIAGNOSIS — Z7901 Long term (current) use of anticoagulants: Secondary | ICD-10-CM

## 2011-12-02 DIAGNOSIS — Z9889 Other specified postprocedural states: Secondary | ICD-10-CM

## 2011-12-02 DIAGNOSIS — I340 Nonrheumatic mitral (valve) insufficiency: Secondary | ICD-10-CM

## 2011-12-02 DIAGNOSIS — I059 Rheumatic mitral valve disease, unspecified: Secondary | ICD-10-CM

## 2011-12-05 ENCOUNTER — Encounter (HOSPITAL_COMMUNITY): Payer: Self-pay

## 2011-12-06 ENCOUNTER — Other Ambulatory Visit: Payer: Medicaid Other

## 2011-12-07 ENCOUNTER — Encounter (HOSPITAL_COMMUNITY): Payer: Self-pay

## 2011-12-09 ENCOUNTER — Ambulatory Visit (INDEPENDENT_AMBULATORY_CARE_PROVIDER_SITE_OTHER): Payer: Medicaid Other | Admitting: *Deleted

## 2011-12-09 ENCOUNTER — Encounter (HOSPITAL_COMMUNITY): Payer: Self-pay

## 2011-12-09 ENCOUNTER — Other Ambulatory Visit (INDEPENDENT_AMBULATORY_CARE_PROVIDER_SITE_OTHER): Payer: Medicaid Other

## 2011-12-09 DIAGNOSIS — I10 Essential (primary) hypertension: Secondary | ICD-10-CM

## 2011-12-09 DIAGNOSIS — Z9889 Other specified postprocedural states: Secondary | ICD-10-CM

## 2011-12-09 DIAGNOSIS — I059 Rheumatic mitral valve disease, unspecified: Secondary | ICD-10-CM

## 2011-12-09 DIAGNOSIS — Z7901 Long term (current) use of anticoagulants: Secondary | ICD-10-CM

## 2011-12-09 DIAGNOSIS — I340 Nonrheumatic mitral (valve) insufficiency: Secondary | ICD-10-CM

## 2011-12-09 LAB — BASIC METABOLIC PANEL
BUN: 11 mg/dL (ref 6–23)
Chloride: 103 mEq/L (ref 96–112)
Glucose, Bld: 101 mg/dL — ABNORMAL HIGH (ref 70–99)
Potassium: 4.4 mEq/L (ref 3.5–5.1)

## 2011-12-11 LAB — AFB CULTURE WITH SMEAR (NOT AT ARMC): Acid Fast Smear: NONE SEEN

## 2011-12-12 ENCOUNTER — Encounter (HOSPITAL_COMMUNITY): Payer: Self-pay

## 2011-12-12 ENCOUNTER — Encounter (HOSPITAL_COMMUNITY): Payer: Self-pay | Admitting: Dentistry

## 2011-12-12 ENCOUNTER — Telehealth: Payer: Self-pay | Admitting: Internal Medicine

## 2011-12-12 ENCOUNTER — Ambulatory Visit (HOSPITAL_COMMUNITY): Payer: Self-pay | Admitting: Dentistry

## 2011-12-12 VITALS — BP 154/77 | HR 76 | Temp 97.8°F

## 2011-12-12 DIAGNOSIS — K08109 Complete loss of teeth, unspecified cause, unspecified class: Secondary | ICD-10-CM

## 2011-12-12 DIAGNOSIS — K0820 Unspecified atrophy of edentulous alveolar ridge: Secondary | ICD-10-CM

## 2011-12-12 DIAGNOSIS — Z463 Encounter for fitting and adjustment of dental prosthetic device: Secondary | ICD-10-CM

## 2011-12-12 NOTE — Telephone Encounter (Signed)
New problem:  Per Byrd Hesselbach calling  From cardiac rehab. Need medicare order for cardiac rehab.

## 2011-12-12 NOTE — Telephone Encounter (Signed)
Message copied by Tarri Fuller on Mon Dec 12, 2011  9:13 AM ------      Message from: [place], [place] T      Created: Mon Dec 12, 2011  9:00 AM       Please notify patient that the lab results are ok.      Tereso Newcomer, PA-C  9:00 AM 12/12/2011

## 2011-12-12 NOTE — Telephone Encounter (Signed)
pt notified of lab results however; did ask if the cardiac rehab form was ever sent over to cardiac rehab. I told her that I would send a note to Dr. Tenny Craw RN Annice Pih to see if form was sent over. Ashley [name]

## 2011-12-12 NOTE — Progress Notes (Signed)
12/12/2011  BP 154/77  Pulse 76  Temp(Src) 97.8 F (36.6 C) (Oral)  LMP 11/28/2011  Ashley [name] presents for continued upper and lower complete denture fabrication. Procedure:  Upper and lower border molding and final impressions in Aquasil. Patient tolerated procedure well. To [employer] for custom baseplates with rims. Return to clinic for upper and lower complete denture jaw relations. Charlynne Pander

## 2011-12-14 ENCOUNTER — Encounter (HOSPITAL_COMMUNITY): Payer: Self-pay

## 2011-12-16 ENCOUNTER — Encounter (HOSPITAL_COMMUNITY): Payer: Self-pay

## 2011-12-19 ENCOUNTER — Telehealth: Payer: Self-pay | Admitting: Internal Medicine

## 2011-12-19 ENCOUNTER — Encounter (HOSPITAL_COMMUNITY): Payer: Self-pay

## 2011-12-19 NOTE — Telephone Encounter (Signed)
New problem:  Per Ashley [name] from Cardiac Rehab at [facility]. Checking on the status of medicaid form that need to be sign by Dr. Tenny Craw.

## 2011-12-19 NOTE — Telephone Encounter (Signed)
Form Ashley [name] is looking for is waiting to be faxed from our office --i called and made her aware to be looking for it--nt

## 2011-12-20 ENCOUNTER — Ambulatory Visit (HOSPITAL_COMMUNITY): Payer: Self-pay | Admitting: Dentistry

## 2011-12-20 DIAGNOSIS — Z463 Encounter for fitting and adjustment of dental prosthetic device: Secondary | ICD-10-CM

## 2011-12-20 DIAGNOSIS — K08109 Complete loss of teeth, unspecified cause, unspecified class: Secondary | ICD-10-CM

## 2011-12-20 NOTE — Progress Notes (Signed)
Tuesday, December 20, 2011   BP: 143/77           P: 86            T: 98.3   Christalynn Lastra presents for continued upper and lower complete denture fabrication. Procedure: Upper and lower complete denture jaw relations with aluwax bite registration. Patient  agrees to tooth selection of 21D, F, 10 degree posteriors to match with Portrait A1 shade.  Patient tolerated procedure well. Return to clinic for upper and lower complete denture wax tryin. Dr. Cindra Eves

## 2011-12-21 ENCOUNTER — Encounter (HOSPITAL_COMMUNITY): Payer: Self-pay

## 2011-12-23 ENCOUNTER — Encounter: Payer: Self-pay | Admitting: Internal Medicine

## 2011-12-23 ENCOUNTER — Encounter (HOSPITAL_COMMUNITY): Payer: Self-pay

## 2011-12-23 ENCOUNTER — Ambulatory Visit (INDEPENDENT_AMBULATORY_CARE_PROVIDER_SITE_OTHER): Payer: Self-pay | Admitting: Internal Medicine

## 2011-12-23 VITALS — BP 129/75 | HR 77 | Ht 68.0 in | Wt 181.0 lb

## 2011-12-23 DIAGNOSIS — I119 Hypertensive heart disease without heart failure: Secondary | ICD-10-CM

## 2011-12-23 DIAGNOSIS — I2510 Atherosclerotic heart disease of native coronary artery without angina pectoris: Secondary | ICD-10-CM

## 2011-12-23 LAB — CBC WITH DIFFERENTIAL/PLATELET
Eosinophils Relative: 10.7 % — ABNORMAL HIGH (ref 0.0–5.0)
HCT: 35.9 % — ABNORMAL LOW (ref 36.0–46.0)
Lymphs Abs: 1.6 10*3/uL (ref 0.7–4.0)
MCV: 96.6 fl (ref 78.0–100.0)
Monocytes Absolute: 0.9 10*3/uL (ref 0.1–1.0)
Platelets: 297 10*3/uL (ref 150.0–400.0)
RDW: 15.7 % — ABNORMAL HIGH (ref 11.5–14.6)
WBC: 8.4 10*3/uL (ref 4.5–10.5)

## 2011-12-23 LAB — LIPID PANEL
Cholesterol: 128 mg/dL (ref 0–200)
LDL Cholesterol: 66 mg/dL (ref 0–99)

## 2011-12-23 LAB — AST: AST: 13 U/L (ref 0–37)

## 2011-12-23 LAB — BASIC METABOLIC PANEL
GFR: 63.07 mL/min (ref >60.00)
Glucose, Bld: 99 mg/dL (ref 70–99)
Potassium: 4.8 mEq/L (ref 3.5–5.1)
Sodium: 137 mEq/L (ref 135–145)

## 2011-12-23 NOTE — Patient Instructions (Signed)
Lab work today We will call you with results.  Follow up with Dr.Ross end of July/beginning of August

## 2011-12-23 NOTE — Progress Notes (Signed)
HPI Patient is a 46 year old with a history of CAD and MR.  S/P CABG x 4 and MV repair on 10/21/11.  She has been seen by S weaver and C Cornelius Moras  She was started on lisinopril 5 in April.   She is finally feeling better.  Denies dizziness,  Breathing is OK.  Chest is a little sore, but improving. Allergies  Allergen Reactions  . Azithromycin Nausea Only    Current Outpatient Prescriptions  Medication Sig Dispense Refill  . aspirin EC 81 MG tablet Take 1 tablet (81 mg total) by mouth daily.      . carvedilol (COREG) 12.5 MG tablet Take 18.75 mg by mouth 2 (two) times daily with a meal.      . famotidine (PEPCID) 20 MG tablet Take 20 mg by mouth daily as needed. For indigestion      . guaiFENesin (MUCINEX) 600 MG 12 hr tablet Take 1 tablet (600 mg total) by mouth 2 (two) times daily.  30 tablet  0  . lisinopril (PRINIVIL,ZESTRIL) 5 MG tablet Take 1 tablet (5 mg total) by mouth daily.  30 tablet  11  . nicotine (NICODERM CQ - DOSED IN MG/24 HOURS) 21 mg/24hr patch Place 1 patch onto the skin daily.       . simvastatin (ZOCOR) 20 MG tablet Take 1 tablet (20 mg total) by mouth every evening.  30 tablet  6  . vitamin C (ASCORBIC ACID) 250 MG tablet Take 250 mg by mouth daily.      [name] warfarin (COUMADIN) 5 MG tablet Take 1 tablet everyday, except 1/2 tablet on Tuesday & Saturday (as of 11/2011)      . DISCONTD: amiodarone (PACERONE) 200 MG tablet Take 200 mg by mouth 2 (two) times daily. Begin 7 days prior to surgery.      [name] DISCONTD: amLODipine (NORVASC) 10 MG tablet Take 1 tablet (10 mg total) by mouth daily.  30 tablet  11  . DISCONTD: furosemide (LASIX) 40 MG tablet Take 1 tablet (40 mg total) by mouth daily.  5 tablet  0  . DISCONTD: potassium chloride SA (K-DUR,KLOR-CON) 20 MEQ tablet Take 1 tablet (20 mEq total) by mouth daily.  5 tablet  0    Past Medical History  Diagnosis Date  . Abdominal pain     ? chronic cholecystitis; admxn to [facility] 9/12 (CT, USN, HIDA done)  . Stroke     in setting of  cigs and OCPs  . Cardiomyopathy     echo 9/12: mild LVH, EF 35-40%, mod MR (difficult to judge /post leaflet restricted), mod BAE, mod RVE, mod TR  . HTN (hypertension)   . Tobacco abuse   . Heart murmur   . Coronary artery disease   . Mitral regurgitation   . Recurrent upper respiratory infection (URI)     COMPLETED 10 DAY COARSE  ABX .COMPLETED 10/09/11  . 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 Tonsillectomy   . Tubal ligation   . Cesarean section   . Cesarean section   . Tee without cardioversion 06/30/2011    Procedure: TRANSESOPHAGEAL ECHOCARDIOGRAM (TEE);  Surgeon: Pricilla Riffle, MD;  Location: [facility] ENDOSCOPY;  Service: Cardiovascular;  Laterality: N/A;  . Mitral valve repair 10/21/2011    Procedure: MITRAL VALVE REPAIR (MVR);  Surgeon: Purcell Nails, MD;  Location: [facility] OR;  Service: Open Heart Surgery;  Laterality: N/A;  .  Coronary artery bypass graft 10/21/2011    Procedure: CORONARY ARTERY BYPASS GRAFTING (CABG);  Surgeon: Purcell Nails, MD;  Location: [facility] OR;  Service: Open Heart Surgery;  Laterality: N/A;  cabg x four, using right leg greater saphenous vein harvested endoscopically  . Cardiac catheterization   . Dental surgery     Family History  Problem Relation Age of Onset  . Stroke Neg Hx   . Anesthesia problems Neg Hx   . Hypotension Neg Hx   . Malignant hyperthermia Neg Hx   . Pseudochol deficiency Neg Hx   . Hypertension Mother   . Heart disease Father   . COPD Father     History   Social History  . Marital Status: Married    Spouse Name: N/A    Number of Children: N/A  . Years of Education: N/A   Occupational History  . Not on file.   Social History Main Topics  . Smoking status: Former Smoker -- 1.0 packs/day for 30 years    Quit date: 05/07/2011  . Smokeless tobacco: Former Neurosurgeon    Quit date: 06/06/2011  . Alcohol Use: No  . Drug Use: No  . Sexually Active: Yes   Other Topics  Concern  . Not on file   Social History Narrative  . No narrative on file    Review of Systems:  All systems reviewed.  They are negative to the above problem except as previously stated.  Vital Signs: BP 129/75  Pulse 77  Ht 5\' 8"  (1.727 m)  Wt 181 lb (82.101 kg)  BMI 27.52 kg/m2  LMP 11/28/2011  Physical Exam Patient is in NAD HEENT:  Normocephalic, atraumatic. EOMI, PERRLA.  Neck: JVP is normal. No thyromegaly. No bruits.  Lungs: clear to auscultation. No rales no wheezes.  Heart: Regular rate and rhythm. Normal S1, S2. No S3.   No significant murmurs. PMI not displaced.  Abdomen:  Supple, nontender. Normal bowel sounds. No masses. No hepatomegaly.  Extremities:   Good distal pulses throughout. No lower extremity edema.  Musculoskeletal :moving all extremities.  Neuro:   alert and oriented x3.  CN II-XII grossly intact.   Assessment and Plan: 1.  CAD.  Doing well post CABG.  Continue meds.  VOlume status looks good. 2.  MVR.  Exam sounds good.  Will check with C Cornelius Moras about coumadin length after surgery. 3.  Dyslipidemia.  Lipids will need to be followed. 4.  [place].  On patch.  COntinue. 5.  CVA.  Keep on meds. 6.  HTN.  GOod control.

## 2011-12-26 ENCOUNTER — Encounter (HOSPITAL_COMMUNITY)
Admission: RE | Admit: 2011-12-26 | Discharge: 2011-12-26 | Disposition: A | Discharge status: Home / Self Care | Payer: Self-pay | Source: Ambulatory Visit | Attending: Internal Medicine | Admitting: Internal Medicine

## 2011-12-26 ENCOUNTER — Telehealth: Payer: Self-pay | Admitting: *Deleted

## 2011-12-26 DIAGNOSIS — Z951 Presence of aortocoronary bypass graft: Secondary | ICD-10-CM | POA: Insufficient documentation

## 2011-12-26 DIAGNOSIS — E785 Hyperlipidemia, unspecified: Secondary | ICD-10-CM | POA: Insufficient documentation

## 2011-12-26 DIAGNOSIS — I2510 Atherosclerotic heart disease of native coronary artery without angina pectoris: Secondary | ICD-10-CM | POA: Insufficient documentation

## 2011-12-26 DIAGNOSIS — Z5189 Encounter for other specified aftercare: Secondary | ICD-10-CM | POA: Insufficient documentation

## 2011-12-26 NOTE — Progress Notes (Signed)
Pt started cardiac rehab today.  Pt tolerated light exercise without difficulty. Telemetry Sinus without ectopy, vital signs stable.  Will continue to monitor the patient throughout  the program.

## 2011-12-26 NOTE — Telephone Encounter (Signed)
LMOM that per Dr.Ross and Dr.Owen she needs to stay on Coumadin thru June 1st. Left message for her to call back to confirm receipt of message.

## 2011-12-27 ENCOUNTER — Telehealth: Payer: Self-pay | Admitting: Internal Medicine

## 2011-12-27 NOTE — Telephone Encounter (Signed)
Patient called back and verbalized understanding of message.

## 2011-12-27 NOTE — Telephone Encounter (Signed)
Follow-up:     Patient called to say that she received your message about her coumadin and she takes it until June 1st.  Please call back if you have any questions.

## 2011-12-27 NOTE — Telephone Encounter (Signed)
Noted in the chart. Coumadin until June 1st.

## 2011-12-28 ENCOUNTER — Encounter (HOSPITAL_COMMUNITY)
Admission: RE | Admit: 2011-12-28 | Discharge: 2011-12-28 | Disposition: A | Discharge status: Home / Self Care | Payer: Self-pay | Source: Ambulatory Visit | Attending: Internal Medicine | Admitting: Internal Medicine

## 2011-12-28 ENCOUNTER — Ambulatory Visit (HOSPITAL_COMMUNITY): Payer: Self-pay | Admitting: Dentistry

## 2011-12-28 VITALS — BP 148/91 | HR 84 | Temp 98.1°F

## 2011-12-28 DIAGNOSIS — K08109 Complete loss of teeth, unspecified cause, unspecified class: Secondary | ICD-10-CM

## 2011-12-28 DIAGNOSIS — Z463 Encounter for fitting and adjustment of dental prosthetic device: Secondary | ICD-10-CM

## 2011-12-28 NOTE — Progress Notes (Signed)
BP 148/91  Pulse 84  Temp(Src) 98.1 F (36.7 C) (Oral)  Ashley [name] presents for continued upper and lower denture fabrication. Procedure:   Upper and lower denture wax tryin. Patient accepts esthetics, phonetics, fit and function. Patient agrees to process "as is" in [place] [address]. Patient to RTC for  upper and lower denture insertion.  Charlynne Pander 12/28/2011

## 2011-12-30 ENCOUNTER — Encounter (HOSPITAL_COMMUNITY)
Admission: RE | Admit: 2011-12-30 | Discharge: 2011-12-30 | Disposition: A | Discharge status: Home / Self Care | Payer: Self-pay | Source: Ambulatory Visit | Attending: Internal Medicine | Admitting: Internal Medicine

## 2011-12-30 ENCOUNTER — Other Ambulatory Visit: Payer: Self-pay | Admitting: Surgical

## 2011-12-30 NOTE — Progress Notes (Signed)
Reviewed home exercise with pt today.  Pt plans to walk at home for exercise.  Reviewed THR, pulse (needs practice), RPE, sign and symptoms, and when to call 911 or MD.  Pt voiced understanding. Aviela Blundell, MA, ACSM RCEP  

## 2012-01-02 ENCOUNTER — Encounter (HOSPITAL_COMMUNITY)
Admission: RE | Admit: 2012-01-02 | Discharge: 2012-01-02 | Disposition: A | Discharge status: Home / Self Care | Payer: Self-pay | Source: Ambulatory Visit | Attending: Internal Medicine | Admitting: Internal Medicine

## 2012-01-02 ENCOUNTER — Other Ambulatory Visit: Payer: Self-pay | Admitting: Internal Medicine

## 2012-01-04 ENCOUNTER — Encounter (HOSPITAL_COMMUNITY)
Admission: RE | Admit: 2012-01-04 | Discharge: 2012-01-04 | Payer: Self-pay | Source: Ambulatory Visit | Attending: Internal Medicine | Admitting: Internal Medicine

## 2012-01-04 NOTE — Progress Notes (Signed)
Kye's initial blood pressure was 166/92 upon questioning Jurni did not take her medications this am.  Repeat blood pressure 154/78.  Katya advised not to exercise today.  Nalaya plans to go home to take her medication and plans to return to exercise on Friday.

## 2012-01-05 ENCOUNTER — Ambulatory Visit (HOSPITAL_COMMUNITY): Payer: Self-pay | Admitting: Dentistry

## 2012-01-05 VITALS — BP 160/78 | HR 71 | Temp 97.7°F

## 2012-01-05 DIAGNOSIS — Z463 Encounter for fitting and adjustment of dental prosthetic device: Secondary | ICD-10-CM

## 2012-01-05 DIAGNOSIS — K08109 Complete loss of teeth, unspecified cause, unspecified class: Secondary | ICD-10-CM

## 2012-01-05 NOTE — Progress Notes (Signed)
01/05/2012  Patient:            Ashley [name] Date of Birth:  1966/02/22 MRN:                [id number]  BP 160/78  Pulse 71  Temp(Src) 97.7 F (36.5 C) (Oral)  Ashley [name] Weight presents for insertion of upper and lower complete dentures. Procedure: Pressure indicating paste applied to dentures. Adjustments made as needed. [place]. Occlusion evaluated and adjustments made as needed for Centric Relation and protrusive strokes. Good esthetics, phonetics, fit, and function noted. Patient accepts results. Post op instructions provided in written and verbal formats on use and care of dentures. Gave patient denture brush and cup. Patient to keep dentures out if sore spots develop. Use salt water rinses as needed to aid healing. Return to clinic as scheduled for denture adjustment.  Call if problems arise before then. Patient dismissed in stable condition. Charlynne Pander

## 2012-01-06 ENCOUNTER — Encounter (HOSPITAL_COMMUNITY)
Admission: RE | Admit: 2012-01-06 | Discharge: 2012-01-06 | Disposition: A | Discharge status: Home / Self Care | Payer: Self-pay | Source: Ambulatory Visit | Attending: Internal Medicine | Admitting: Internal Medicine

## 2012-01-06 ENCOUNTER — Ambulatory Visit (INDEPENDENT_AMBULATORY_CARE_PROVIDER_SITE_OTHER): Payer: Medicaid Other | Admitting: Pharmacist

## 2012-01-06 DIAGNOSIS — Z7901 Long term (current) use of anticoagulants: Secondary | ICD-10-CM

## 2012-01-06 DIAGNOSIS — Z9889 Other specified postprocedural states: Secondary | ICD-10-CM

## 2012-01-06 DIAGNOSIS — I340 Nonrheumatic mitral (valve) insufficiency: Secondary | ICD-10-CM

## 2012-01-06 DIAGNOSIS — I059 Rheumatic mitral valve disease, unspecified: Secondary | ICD-10-CM

## 2012-01-06 LAB — POCT INR: INR: 1.8

## 2012-01-06 NOTE — Progress Notes (Signed)
Ashley [name] returned to exercise today blood pressures were within normal limits.

## 2012-01-09 ENCOUNTER — Ambulatory Visit (HOSPITAL_COMMUNITY): Payer: Self-pay | Admitting: Dentistry

## 2012-01-09 ENCOUNTER — Encounter (HOSPITAL_COMMUNITY)
Admission: RE | Admit: 2012-01-09 | Discharge: 2012-01-09 | Disposition: A | Discharge status: Home / Self Care | Payer: Self-pay | Source: Ambulatory Visit | Attending: Internal Medicine | Admitting: Internal Medicine

## 2012-01-09 VITALS — BP 145/72 | HR 75 | Temp 98.0°F

## 2012-01-09 DIAGNOSIS — K1370 Unspecified lesions of oral mucosa: Secondary | ICD-10-CM

## 2012-01-09 DIAGNOSIS — Z463 Encounter for fitting and adjustment of dental prosthetic device: Secondary | ICD-10-CM

## 2012-01-09 DIAGNOSIS — K08109 Complete loss of teeth, unspecified cause, unspecified class: Secondary | ICD-10-CM

## 2012-01-09 NOTE — Progress Notes (Signed)
Ashley [name] 46 y.o. female Nutrition Note: Low Risk Spoke with pt.  Nutrition Plan, Nutrition Survey, and cholesterol goals reviewed with pt. Pt is following Step 2 of the Therapeutic Lifestyle Changes diet. Pt reports she gained wt with tobacco cessation and lost wt post-operatively. Pt has maintained tobacco cessation over the past 6 months. Weight loss tips discussed. Pt on Coumadin. Pt is aware of the need for consistent vitamin K intake while on Coumadin. Pt able to recall food high in vitamin K. Pt reports Coumadin should be discontinued in about 1 week. Pt expressed understanding of information reviewed. Nutrition Diagnosis   Food-and nutrition-related knowledge deficit related to lack of exposure to information as related to diagnosis of: ? CVD   Overweight related to excessive energy intake as evidenced by a BMI of 29.1  Nutrition RX/ Estimated Daily Nutrition Needs for: wt loss  1350-1850 Kcal, 35-50 gm fat, 10-14 gm sat fat, 1.3-1.8 gm trans-fat, <1500 mg sodium  Nutrition Intervention   Pt's individual nutrition plan including cholesterol goals reviewed with pt.   Benefits of adopting Therapeutic Lifestyle Changes discussed when Medficts reviewed.   Pt to attend the Portion Distortion class   Pt to attend the  ? Nutrition I class                     ? Nutrition II class   Pt given handouts for: ? wt loss ? Consistent vit K diet   Continue client-centered nutrition education by RD, as part of interdisciplinary care. Goal(s)   Pt to identify food quantities necessary to achieve: ? wt loss to a goal wt of 167-179 lb (75.9-81.4 kg) at graduation from cardiac rehab.  Monitor and Evaluate progress toward nutrition goal with team.

## 2012-01-09 NOTE — Progress Notes (Signed)
Monday, Jan 09, 2012   BP:145/72          P: 41           T:   98.0  Ashley [name] presents for adjustment of recently inserted upper and lower complete dentures. S: Patient is complaining of problems keeping lower denture in. Patient only had problems with gagging one time. Exam: No signs of denture irritation or erythema . Procedure: Pressure indicating paste was applied to dentures. Adjustments made as needed. [place]. Occlusion evaluated and adjustments made as needed for centric relation and protrusive strokes. Patient informed of need to keep the tongue as still as possible to allow for the stability and retention of lower denture. Patient accepts results. Patient to keep dentures out if sore spots develop. Use salt water rinses as needed to aid healing. RTC as scheduled for denture adjustment. Call if problems arise before then. Patient dismissed in stable condition. Dr. Cindra Eves

## 2012-01-11 ENCOUNTER — Encounter (HOSPITAL_COMMUNITY)
Admission: RE | Admit: 2012-01-11 | Discharge: 2012-01-11 | Disposition: A | Discharge status: Home / Self Care | Payer: Self-pay | Source: Ambulatory Visit | Attending: Internal Medicine | Admitting: Internal Medicine

## 2012-01-13 ENCOUNTER — Encounter (HOSPITAL_COMMUNITY)
Admission: RE | Admit: 2012-01-13 | Discharge: 2012-01-13 | Disposition: A | Discharge status: Home / Self Care | Payer: Self-pay | Source: Ambulatory Visit | Attending: Internal Medicine | Admitting: Internal Medicine

## 2012-01-16 ENCOUNTER — Encounter (HOSPITAL_COMMUNITY): Payer: Self-pay

## 2012-01-18 ENCOUNTER — Encounter (HOSPITAL_COMMUNITY)
Admission: RE | Admit: 2012-01-18 | Discharge: 2012-01-18 | Disposition: A | Discharge status: Home / Self Care | Payer: Self-pay | Source: Ambulatory Visit | Attending: Internal Medicine | Admitting: Internal Medicine

## 2012-01-20 ENCOUNTER — Encounter (HOSPITAL_COMMUNITY)
Admission: RE | Admit: 2012-01-20 | Discharge: 2012-01-20 | Disposition: A | Discharge status: Home / Self Care | Payer: Self-pay | Source: Ambulatory Visit | Attending: Internal Medicine | Admitting: Internal Medicine

## 2012-01-23 ENCOUNTER — Encounter (HOSPITAL_COMMUNITY): Payer: Self-pay | Admitting: Dentistry

## 2012-01-23 ENCOUNTER — Encounter (HOSPITAL_COMMUNITY)
Admission: RE | Admit: 2012-01-23 | Discharge: 2012-01-23 | Disposition: A | Discharge status: Home / Self Care | Payer: Self-pay | Source: Ambulatory Visit | Attending: Internal Medicine | Admitting: Internal Medicine

## 2012-01-23 ENCOUNTER — Ambulatory Visit (HOSPITAL_COMMUNITY): Payer: Medicaid - Dental | Admitting: Dentistry

## 2012-01-23 VITALS — BP 138/73 | HR 77 | Temp 98.6°F

## 2012-01-23 DIAGNOSIS — Z5189 Encounter for other specified aftercare: Secondary | ICD-10-CM | POA: Insufficient documentation

## 2012-01-23 DIAGNOSIS — I2510 Atherosclerotic heart disease of native coronary artery without angina pectoris: Secondary | ICD-10-CM | POA: Insufficient documentation

## 2012-01-23 DIAGNOSIS — Z951 Presence of aortocoronary bypass graft: Secondary | ICD-10-CM | POA: Insufficient documentation

## 2012-01-23 DIAGNOSIS — E785 Hyperlipidemia, unspecified: Secondary | ICD-10-CM | POA: Insufficient documentation

## 2012-01-23 DIAGNOSIS — K08109 Complete loss of teeth, unspecified cause, unspecified class: Secondary | ICD-10-CM

## 2012-01-23 DIAGNOSIS — K1370 Unspecified lesions of oral mucosa: Secondary | ICD-10-CM

## 2012-01-23 DIAGNOSIS — Z463 Encounter for fitting and adjustment of dental prosthetic device: Secondary | ICD-10-CM

## 2012-01-23 NOTE — Progress Notes (Signed)
Monday, January 23, 2012   BP: 138/73        P:  77           T: 98.6  Ashley [name] presents for adjustment of recently inserted upper and lower complete dentures. S: Patient denies having any sore spots. Patient is complaining of problems keeping lower denture in after 4-5 hours of use. Exam: No signs of denture irritation or erythema . Procedure: Pressure indicating paste was applied to dentures. Adjustments made as needed. [place]. Occlusion evaluated and adjustments made as needed for centric relation and protrusive strokes. Patient informed of potential need to reapply denture adhesive if needed. Patient again reminded to keep the tongue as still as possible to allow for the stability and retention of lower denture. Patient accepts results. Patient to keep dentures out if sore spots develop. Use salt water rinses as needed to aid healing. RTC as scheduled for denture adjustment. Call if problems arise before then. Patient dismissed in stable condition. Dr. Cindra Eves

## 2012-01-25 ENCOUNTER — Encounter (HOSPITAL_COMMUNITY)
Admission: RE | Admit: 2012-01-25 | Discharge: 2012-01-25 | Disposition: A | Discharge status: Home / Self Care | Payer: Self-pay | Source: Ambulatory Visit | Attending: Internal Medicine | Admitting: Internal Medicine

## 2012-01-27 ENCOUNTER — Encounter (HOSPITAL_COMMUNITY)
Admission: RE | Admit: 2012-01-27 | Discharge: 2012-01-27 | Disposition: A | Discharge status: Home / Self Care | Payer: Self-pay | Source: Ambulatory Visit | Attending: Internal Medicine | Admitting: Internal Medicine

## 2012-01-30 ENCOUNTER — Encounter (HOSPITAL_COMMUNITY)
Admission: RE | Admit: 2012-01-30 | Discharge: 2012-01-30 | Disposition: A | Discharge status: Home / Self Care | Payer: Self-pay | Source: Ambulatory Visit | Attending: Internal Medicine | Admitting: Internal Medicine

## 2012-02-01 ENCOUNTER — Encounter (HOSPITAL_COMMUNITY)
Admission: RE | Admit: 2012-02-01 | Discharge: 2012-02-01 | Disposition: A | Discharge status: Home / Self Care | Payer: Self-pay | Source: Ambulatory Visit | Attending: Internal Medicine | Admitting: Internal Medicine

## 2012-02-03 ENCOUNTER — Encounter (HOSPITAL_COMMUNITY): Payer: Self-pay | Admitting: *Deleted

## 2012-02-03 ENCOUNTER — Encounter (HOSPITAL_COMMUNITY): Payer: Self-pay

## 2012-02-03 NOTE — Progress Notes (Signed)
[name] came to exercise today. The top of Ashley [name]'s foot was ecchymotic from where she hit it at home.  [name] to go to the urgent care to have it evaluated. [name] not to exercise today.

## 2012-02-06 ENCOUNTER — Encounter (HOSPITAL_COMMUNITY): Payer: Self-pay

## 2012-02-08 ENCOUNTER — Encounter (HOSPITAL_COMMUNITY)
Admission: RE | Admit: 2012-02-08 | Discharge: 2012-02-08 | Disposition: A | Discharge status: Home / Self Care | Payer: Self-pay | Source: Ambulatory Visit | Attending: Internal Medicine | Admitting: Internal Medicine

## 2012-02-08 NOTE — Progress Notes (Signed)
Pt ws absent Monday because of foot pain.  Pt went to urgent care and was told she had a broken toe.  Pt advised by urgent care MD to advance activity as tolerated.  Pt able to exercise today on stepper first and third station and bike on the second station.  Pt able to tolerate some walking without any discomfort.

## 2012-02-10 ENCOUNTER — Encounter (HOSPITAL_COMMUNITY)
Admission: RE | Admit: 2012-02-10 | Discharge: 2012-02-10 | Disposition: A | Discharge status: Home / Self Care | Payer: Self-pay | Source: Ambulatory Visit | Attending: Internal Medicine | Admitting: Internal Medicine

## 2012-02-13 ENCOUNTER — Encounter (HOSPITAL_COMMUNITY)
Admission: RE | Admit: 2012-02-13 | Discharge: 2012-02-13 | Disposition: A | Discharge status: Home / Self Care | Payer: Self-pay | Source: Ambulatory Visit | Attending: Internal Medicine | Admitting: Internal Medicine

## 2012-02-15 ENCOUNTER — Encounter (HOSPITAL_COMMUNITY)
Admission: RE | Admit: 2012-02-15 | Discharge: 2012-02-15 | Disposition: A | Discharge status: Home / Self Care | Payer: Self-pay | Source: Ambulatory Visit | Attending: Internal Medicine | Admitting: Internal Medicine

## 2012-02-17 ENCOUNTER — Encounter (HOSPITAL_COMMUNITY): Admission: RE | Admit: 2012-02-17 | Payer: Self-pay | Source: Ambulatory Visit

## 2012-02-20 ENCOUNTER — Encounter (HOSPITAL_COMMUNITY)
Admission: RE | Admit: 2012-02-20 | Discharge: 2012-02-20 | Disposition: A | Discharge status: Home / Self Care | Payer: Self-pay | Source: Ambulatory Visit | Attending: Internal Medicine | Admitting: Internal Medicine

## 2012-02-20 DIAGNOSIS — Z5189 Encounter for other specified aftercare: Secondary | ICD-10-CM | POA: Insufficient documentation

## 2012-02-20 DIAGNOSIS — I2510 Atherosclerotic heart disease of native coronary artery without angina pectoris: Secondary | ICD-10-CM | POA: Insufficient documentation

## 2012-02-20 DIAGNOSIS — Z951 Presence of aortocoronary bypass graft: Secondary | ICD-10-CM | POA: Insufficient documentation

## 2012-02-20 DIAGNOSIS — E785 Hyperlipidemia, unspecified: Secondary | ICD-10-CM | POA: Insufficient documentation

## 2012-02-22 ENCOUNTER — Encounter (HOSPITAL_COMMUNITY)
Admission: RE | Admit: 2012-02-22 | Discharge: 2012-02-22 | Disposition: A | Discharge status: Home / Self Care | Payer: Self-pay | Source: Ambulatory Visit | Attending: Internal Medicine | Admitting: Internal Medicine

## 2012-02-24 ENCOUNTER — Encounter (HOSPITAL_COMMUNITY)
Admission: RE | Admit: 2012-02-24 | Discharge: 2012-02-24 | Disposition: A | Discharge status: Home / Self Care | Payer: Self-pay | Source: Ambulatory Visit | Attending: Internal Medicine | Admitting: Internal Medicine

## 2012-02-27 ENCOUNTER — Encounter (HOSPITAL_COMMUNITY)
Admission: RE | Admit: 2012-02-27 | Discharge: 2012-02-27 | Disposition: A | Discharge status: Home / Self Care | Payer: Self-pay | Source: Ambulatory Visit | Attending: Internal Medicine | Admitting: Internal Medicine

## 2012-02-29 ENCOUNTER — Encounter (HOSPITAL_COMMUNITY)
Admission: RE | Admit: 2012-02-29 | Discharge: 2012-02-29 | Disposition: A | Discharge status: Home / Self Care | Payer: Self-pay | Source: Ambulatory Visit | Attending: Internal Medicine | Admitting: Internal Medicine

## 2012-03-02 ENCOUNTER — Encounter (HOSPITAL_COMMUNITY)
Admission: RE | Admit: 2012-03-02 | Discharge: 2012-03-02 | Disposition: A | Discharge status: Home / Self Care | Payer: Self-pay | Source: Ambulatory Visit | Attending: Internal Medicine | Admitting: Internal Medicine

## 2012-03-05 ENCOUNTER — Encounter (HOSPITAL_COMMUNITY)
Admission: RE | Admit: 2012-03-05 | Discharge: 2012-03-05 | Disposition: A | Discharge status: Home / Self Care | Payer: Self-pay | Source: Ambulatory Visit | Attending: Internal Medicine | Admitting: Internal Medicine

## 2012-03-05 ENCOUNTER — Ambulatory Visit: Payer: Self-pay | Admitting: Thoracic Surgery (Cardiothoracic Vascular Surgery)

## 2012-03-07 ENCOUNTER — Encounter (HOSPITAL_COMMUNITY)
Admission: RE | Admit: 2012-03-07 | Discharge: 2012-03-07 | Disposition: A | Discharge status: Home / Self Care | Payer: Self-pay | Source: Ambulatory Visit | Attending: Internal Medicine | Admitting: Internal Medicine

## 2012-03-09 ENCOUNTER — Other Ambulatory Visit: Payer: Self-pay | Admitting: Internal Medicine

## 2012-03-09 ENCOUNTER — Encounter (HOSPITAL_COMMUNITY)
Admission: RE | Admit: 2012-03-09 | Discharge: 2012-03-09 | Disposition: A | Discharge status: Home / Self Care | Payer: Self-pay | Source: Ambulatory Visit | Attending: Internal Medicine | Admitting: Internal Medicine

## 2012-03-12 ENCOUNTER — Other Ambulatory Visit: Payer: Self-pay | Admitting: *Deleted

## 2012-03-12 ENCOUNTER — Encounter (HOSPITAL_COMMUNITY)
Admission: RE | Admit: 2012-03-12 | Discharge: 2012-03-12 | Disposition: A | Discharge status: Home / Self Care | Payer: Self-pay | Source: Ambulatory Visit | Attending: Internal Medicine | Admitting: Internal Medicine

## 2012-03-12 MED ORDER — SIMVASTATIN 20 MG PO TABS: 20.0000 mg | ORAL_TABLET | Freq: Every day | ORAL | Status: DC

## 2012-03-14 ENCOUNTER — Encounter (HOSPITAL_COMMUNITY)
Admission: RE | Admit: 2012-03-14 | Discharge: 2012-03-14 | Disposition: A | Discharge status: Home / Self Care | Payer: Self-pay | Source: Ambulatory Visit | Attending: Internal Medicine | Admitting: Internal Medicine

## 2012-03-16 ENCOUNTER — Encounter (HOSPITAL_COMMUNITY)
Admission: RE | Admit: 2012-03-16 | Discharge: 2012-03-16 | Disposition: A | Discharge status: Home / Self Care | Payer: Self-pay | Source: Ambulatory Visit | Attending: Internal Medicine | Admitting: Internal Medicine

## 2012-03-16 NOTE — Progress Notes (Signed)
[name] graduates today. Ashley [name] plans to continue exercise on her own.

## 2012-03-19 ENCOUNTER — Ambulatory Visit: Payer: Self-pay | Admitting: Internal Medicine

## 2012-03-23 ENCOUNTER — Ambulatory Visit (INDEPENDENT_AMBULATORY_CARE_PROVIDER_SITE_OTHER): Payer: Self-pay | Admitting: Internal Medicine

## 2012-03-23 ENCOUNTER — Encounter: Payer: Self-pay | Admitting: Internal Medicine

## 2012-03-23 VITALS — BP 138/72 | HR 77 | Ht 68.0 in | Wt 185.0 lb

## 2012-03-23 DIAGNOSIS — E785 Hyperlipidemia, unspecified: Secondary | ICD-10-CM

## 2012-03-23 DIAGNOSIS — I2510 Atherosclerotic heart disease of native coronary artery without angina pectoris: Secondary | ICD-10-CM

## 2012-03-23 NOTE — Progress Notes (Signed)
HPI Patient is a 46 year old with a history of CAD and MR  S/p CABG x 4 and MV repair March 2013  I saw her in May.   She continues to feel better.  Still has some chest wall pain that is improving.  Breathing is OK  No palpitiatons.  Echo in APril LVEF was normal. Allergies  Allergen Reactions  . Azithromycin Nausea Only    Current Outpatient Prescriptions  Medication Sig Dispense Refill  . aspirin EC 81 MG tablet Take 1 tablet (81 mg total) by mouth daily.      . carvedilol (COREG) 12.5 MG tablet Take 18.75 mg by mouth 2 (two) times daily with a meal.      . famotidine (PEPCID) 20 MG tablet Take 20 mg by mouth daily as needed. For indigestion      . guaiFENesin (MUCINEX) 600 MG 12 hr tablet Take 1 tablet (600 mg total) by mouth 2 (two) times daily.  30 tablet  0  . lisinopril (PRINIVIL,ZESTRIL) 5 MG tablet Take 1 tablet (5 mg total) by mouth daily.  30 tablet  11  . simvastatin (ZOCOR) 20 MG tablet Take 1 tablet (20 mg total) by mouth at bedtime.  30 tablet  6  . vitamin C (ASCORBIC ACID) 250 MG tablet Take 250 mg by mouth daily.      [name] DISCONTD: amiodarone (PACERONE) 200 MG tablet Take 200 mg by mouth 2 (two) times daily. Begin 7 days prior to surgery.      [name] DISCONTD: amLODipine (NORVASC) 10 MG tablet Take 1 tablet (10 mg total) by mouth daily.  30 tablet  11  . DISCONTD: furosemide (LASIX) 40 MG tablet Take 1 tablet (40 mg total) by mouth daily.  5 tablet  0  . DISCONTD: potassium chloride SA (K-DUR,KLOR-CON) 20 MEQ tablet Take 1 tablet (20 mEq total) by mouth daily.  5 tablet  0    Past Medical History  Diagnosis Date  . Abdominal pain     ? chronic cholecystitis; admxn to [facility] 9/12 (CT, USN, HIDA done)  . Stroke     in setting of cigs and OCPs  . Cardiomyopathy     echo 9/12: mild LVH, EF 35-40%, mod MR (difficult to judge /post leaflet restricted), mod BAE, mod RVE, mod TR  . HTN (hypertension)   . Tobacco abuse   . Heart murmur   . Coronary artery disease   . Mitral  regurgitation   . Recurrent upper respiratory infection (URI)     COMPLETED 10 DAY COARSE  ABX .COMPLETED 10/09/11  . 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 Tonsillectomy   . Tubal ligation   . Cesarean section   . Cesarean section   . Tee without cardioversion 06/30/2011    Procedure: TRANSESOPHAGEAL ECHOCARDIOGRAM (TEE);  Surgeon: Pricilla Riffle, MD;  Location: [facility] ENDOSCOPY;  Service: Cardiovascular;  Laterality: N/A;  . Mitral valve repair 10/21/2011    Procedure: MITRAL VALVE REPAIR (MVR);  Surgeon: Purcell Nails, MD;  Location: [facility] OR;  Service: Open Heart Surgery;  Laterality: N/A;  . Coronary artery bypass graft 10/21/2011    Procedure: CORONARY ARTERY BYPASS GRAFTING (CABG);  Surgeon: Purcell Nails, MD;  Location: [facility] OR;  Service: Open Heart Surgery;  Laterality: N/A;  cabg x four, using right leg greater saphenous vein harvested endoscopically  . Cardiac catheterization   . Dental surgery  Family History  Problem Relation Age of Onset  . Stroke Neg Hx   . Anesthesia problems Neg Hx   . Hypotension Neg Hx   . Malignant hyperthermia Neg Hx   . Pseudochol deficiency Neg Hx   . Hypertension Mother   . Heart disease Father   . COPD Father     History   Social History  . Marital Status: Married    Spouse Name: N/A    Number of Children: N/A  . Years of Education: N/A   Occupational History  . Not on file.   Social History Main Topics  . Smoking status: Former Smoker -- 1.0 packs/day for 30 years    Quit date: 05/07/2011  . Smokeless tobacco: Former Neurosurgeon    Quit date: 06/06/2011  . Alcohol Use: No  . Drug Use: No  . Sexually Active: Yes   Other Topics Concern  . Not on file   Social History Narrative  . No narrative on file    Review of Systems:  All systems reviewed.  They are negative to the above problem except as previously stated.  Vital Signs: BP 138/72  Pulse 77  Ht 5\' 8"  (1.727 m)   Wt 185 lb (83.915 kg)  BMI 28.13 kg/m2  Physical Exam Patient is in NAD HEENT:  Normocephalic, atraumatic. EOMI, PERRLA.  Neck: JVP is normal. No thyromegaly. No bruits.  Lungs: clear to auscultation. No rales no wheezes.  Heart: Regular rate and rhythm. Normal S1, S2. No S3.   No significant murmurs. PMI not displaced.  Abdomen:  Supple, nontender. Normal bowel sounds. No masses. No hepatomegaly.  Extremities:   Good distal pulses throughout. No lower extremity edema.  Musculoskeletal :moving all extremities.  Neuro:   alert and oriented x3.  CN II-XII grossly intact.   Assessment and Plan:  1.  CAD  Recovering from surgery fairly well.  I encouraged her to increase her activity to 5 days per week.    2.  MV disease.  S/P MV reparir.  Excellent result.  3  HL  Continue statin.  4.  HTN  Good control.

## 2012-03-23 NOTE — Patient Instructions (Signed)
Your physician wants you to follow-up in:  6 months. You will receive a reminder letter in the mail two months in advance. If you don't receive a letter, please call our office to schedule the follow-up appointment.   

## 2012-03-27 ENCOUNTER — Ambulatory Visit (HOSPITAL_COMMUNITY): Payer: Self-pay | Admitting: Dentistry

## 2012-03-27 ENCOUNTER — Encounter (HOSPITAL_COMMUNITY): Payer: Self-pay | Admitting: Dentistry

## 2012-03-27 VITALS — BP 199/98 | HR 72

## 2012-03-27 DIAGNOSIS — K1370 Unspecified lesions of oral mucosa: Secondary | ICD-10-CM

## 2012-03-27 DIAGNOSIS — K0820 Unspecified atrophy of edentulous alveolar ridge: Secondary | ICD-10-CM

## 2012-03-27 DIAGNOSIS — K08109 Complete loss of teeth, unspecified cause, unspecified class: Secondary | ICD-10-CM

## 2012-03-27 DIAGNOSIS — Z463 Encounter for fitting and adjustment of dental prosthetic device: Secondary | ICD-10-CM

## 2012-03-27 NOTE — Progress Notes (Signed)
03/27/2012  Patient:            Monet [place] Date of Birth:  12/29/1965 MRN:                [id number]  BP 199/98  Pulse 72 Please note: Patient with blood pressure medications daily in blood pressure has been normal by patient report. Patient denies any problems with dizziness, headache, or other untoward effects. Patient wished to proceed with dental treatment today.  Dealie Arrona presents for adjustment of recently inserted upper and lower complete dentures. S: Patient denies having any sore spots. Patient is complaining of problems keeping lower denture in after 4-5 hours of use as before. Exam: No signs of denture irritation or erythema . Atrophy of edentulous alveolar ridges as before.  Procedure: Pressure indicating paste was applied to dentures. Adjustments made as needed. [place]. Occlusion evaluated and adjustments made as needed for centric relation and protrusive strokes. Patient informed of potential need to reapply denture adhesive later in the day as needed. Patient again reminded to keep the tongue as still as possible to allow for the stability and retention of lower denture. Will consider lab relines at the November appointment. Patient accepts results. Patient to keep dentures out if sore spots develop. Use salt water rinses as needed to aid healing. RTC as scheduled for periodic oral examination and denture adjustment. Call if problems arise before then. Patient dismissed in stable condition. Dr. Cindra Eves

## 2012-04-09 ENCOUNTER — Ambulatory Visit: Payer: Self-pay | Admitting: Thoracic Surgery (Cardiothoracic Vascular Surgery)

## 2012-04-16 ENCOUNTER — Ambulatory Visit (INDEPENDENT_AMBULATORY_CARE_PROVIDER_SITE_OTHER): Payer: Self-pay | Admitting: Thoracic Surgery (Cardiothoracic Vascular Surgery)

## 2012-04-16 ENCOUNTER — Encounter: Payer: Self-pay | Admitting: Thoracic Surgery (Cardiothoracic Vascular Surgery)

## 2012-04-16 VITALS — BP 212/108 | HR 76 | Resp 20 | Ht 68.0 in | Wt 185.0 lb

## 2012-04-16 DIAGNOSIS — Z9889 Other specified postprocedural states: Secondary | ICD-10-CM

## 2012-04-16 DIAGNOSIS — Z951 Presence of aortocoronary bypass graft: Secondary | ICD-10-CM

## 2012-04-16 NOTE — Progress Notes (Signed)
[address]            [name] [address]          [phone]     CARDIOTHORACIC SURGERY OFFICE NOTE  Referring Provider is Pricilla Riffle, MD PCP is Dietrich Pates, MD   HPI:  Patient returns for routine followup status post mitral valve repair and coronary artery bypass grafting x4 on 10/21/2011. She was last seen here in our office on 11/28/2011. Since then she has continued to do very well. She's been followed carefully by Dr. Tenny Craw who she saw this month.  Ashley [name] reports that she is getting along very well. The skin along the anterior left chest remains sensitive since her surgery with left internal mammary artery harvested in grafting. She otherwise has no problems whatsoever. She states that her exercise tolerance is quite good and she only gets short of breath if she really pushes herself. She has not had any tachypalpitations or dizzy spells. She has not had any exertional chest pain. Overall she feels well and has no complaints.   Current Outpatient Prescriptions  Medication Sig Dispense Refill  . aspirin EC 81 MG tablet Take 1 tablet (81 mg total) by mouth daily.      . carvedilol (COREG) 12.5 MG tablet Take 18.75 mg by mouth 2 (two) times daily with a meal.      . famotidine (PEPCID) 20 MG tablet Take 20 mg by mouth daily as needed. For indigestion      . lisinopril (PRINIVIL,ZESTRIL) 5 MG tablet Take 1 tablet (5 mg total) by mouth daily.  30 tablet  11  . simvastatin (ZOCOR) 20 MG tablet Take 1 tablet (20 mg total) by mouth at bedtime.  30 tablet  6  . vitamin C (ASCORBIC ACID) 250 MG tablet Take 250 mg by mouth daily.      [name] DISCONTD: amiodarone (PACERONE) 200 MG tablet Take 200 mg by mouth 2 (two) times daily. Begin 7 days prior to surgery.      [name] DISCONTD: amLODipine (NORVASC) 10 MG tablet Take 1 tablet (10 mg total) by mouth daily.  30 tablet  11  . DISCONTD: furosemide (LASIX) 40 MG tablet Take 1 tablet (40 mg total) by mouth daily.  5 tablet   0  . DISCONTD: potassium chloride SA (K-DUR,KLOR-CON) 20 MEQ tablet Take 1 tablet (20 mEq total) by mouth daily.  5 tablet  0      Physical Exam:   BP 212/108  Pulse 76  Resp 20  Ht 5\' 8"  (1.727 m)  Wt 185 lb (83.915 kg)  BMI 28.13 kg/m2  SpO2 98%  General:  Well appearing  Chest:   Clear to auscultation with symmetrical breath sounds  CV:   Regular rate and rhythm without murmur  Incisions:  Completely healed  Abdomen:  Soft and nontender  Extremities:  Warm and well-perfused  Diagnostic Tests:  Transthoracic Echocardiography  Patient:    Ashley [name] MR #:       [id number] Study Date: 11/21/2011 Gender:     F Age:        46 Height:     172.7cm Weight:     80.3kg BSA:        1.86m^2 Pt. Status: Room:    ORDERING     Dietrich Pates, MD  ATTENDING    Tereso Newcomer  ORDERING  Alben Spittle, Scott  REFERRING    Weaver, Scott  PERFORMING   [facility], [facility]  SONOGRAPHER  Philomena Course, RDCS cc:  ------------------------------------------------------------ LV EF: 55% -   60%  ------------------------------------------------------------ Indications:     424.0 Mitral valve repair  Dyspnea 786.09.   ------------------------------------------------------------ History:   PMH:  Acquired from the patient and from the patient's chart.  Dyspnea.  Coronary artery disease. Dilated cardiomyopathy.  Mitral valve prolapse. Mitral regurgitation, post repair, October 21, 2011.  Stroke.  Risk factors:  Former tobacco use. Hypertension. Dyslipidemia.   ------------------------------------------------------------ Study Conclusions  - Left ventricle: The cavity size was normal. Wall thickness   was increased in a pattern of mild LVH. Systolic function   was normal. The estimated ejection fraction was in the   range of 55% to 60%. Wall motion was normal; there were no   regional wall motion abnormalities. The study is not   technically sufficient to allow evaluation of LV  diastolic   function. - Mitral valve: The mitral valve has been repaired by   history. There is no significant MR and no significant MS. - Left atrium: The atrium was moderately dilated. - Right ventricle: The cavity size was mildly dilated. - Right atrium: The atrium was mildly dilated. - Pulmonary arteries: PA peak pressure: 34mm Hg (S).  ------------------------------------------------------------ Labs, prior tests, procedures, and surgery: Catheterization (October 2012).  Transthoracic echocardiography.  M-mode, complete 2D, spectral Doppler, and color Doppler.  Height:  Height: 172.7cm. Height: 68in.  Weight:  Weight: 80.3kg. Weight: 176.6lb.  Body mass index:  BMI: 26.9kg/m^2.  Body surface area:    BSA: 1.68m^2.  Blood pressure:     96/64.  Patient status:  Outpatient.  Location:  [facility]  ------------------------------------------------------------  ------------------------------------------------------------ Left ventricle:  The cavity size was normal. Wall thickness was increased in a pattern of mild LVH. Systolic function was normal. The estimated ejection fraction was in the range of 55% to 60%. Wall motion was normal; there were no regional wall motion abnormalities. The study is not technically sufficient to allow evaluation of LV diastolic function.  ------------------------------------------------------------ Aortic valve:   Structurally normal valve.   Cusp separation was normal.  Doppler:  Transvalvular velocity was within the normal range. There was no stenosis.  No regurgitation.  ------------------------------------------------------------ Aorta:  Aortic root: The aortic root was normal in size.  ------------------------------------------------------------ Mitral valve:  The mitral valve has been repaired by history. There is no significant MR and no significant MS. Doppler:     Valve area by pressure half-time: 2.75cm^2. Indexed valve area by  pressure half-time: 1.42cm^2/m^2. Indexed valve area by continuity equation (using LVOT flow): 0.88cm^2/m^2.    Mean gradient: 6mm Hg (D). Peak gradient: 11mm Hg (D).  ------------------------------------------------------------ Left atrium:  The atrium was moderately dilated.  ------------------------------------------------------------ Right ventricle:  The cavity size was mildly dilated. Systolic function was normal.  ------------------------------------------------------------ Pulmonic valve:    The valve appears to be grossly normal.  Doppler:   No significant regurgitation.  ------------------------------------------------------------ Tricuspid valve:   Structurally normal valve.   Leaflet separation was normal.  Doppler:  Transvalvular velocity was within the normal range.  Trivial regurgitation.  ------------------------------------------------------------ Right atrium:  The atrium was mildly dilated.  ------------------------------------------------------------ Pericardium:  There was no pericardial effusion.  ------------------------------------------------------------  2D measurements        Normal  Doppler measurements    Norma Left ventricle  l LVID ED,   50.6 mm     43-52   Main pulmonary artery chord,                         Pressure,   34 mm Hg    =30 PLAX                           S LVID ES,   35.9 mm     23-38   Left ventricle chord,                         Ea, lat    8.0 cm/s     ----- PLAX                           ann, tiss    1 FS, chord,   29 %      >29     DP PLAX                           E/Ea, lat  20.          ----- LVPW, ED   12.1 mm     ------  ann, tiss   85 IVS/LVPW   1.12        <1.3    DP ratio, ED                      Ea, med    3.3 cm/s     ----- Ventricular septum             ann, tiss    8 IVS, ED    13.6 mm     ------  DP LVOT                           E/Ea, med  49.          ----- Diam, S       22 mm     ------  ann, tiss   41 Area        3.8 cm^2   ------  DP Diam         22 mm     ------  LVOT Aorta                          Peak vel,  112 cm/s     ----- Root diam,   29 mm     ------  S ED                             VTI, S     22. cm       ----- Left atrium                                 5 AP dim       45 mm     ------  Peak         5 mm Hg    ----- AP dim     2.32 cm/m^2 <2.2    gradient, index  S                                HR          72 bpm      -----                                Stroke vol 85. ml       -----                                             5                                Cardiac    6.2 L/min    -----                                output                                Cardiac    3.2 L/(min-m -----                                index          ^2)                                Stroke     44. ml/m^2   -----                                index        1                                Mitral valve                                Peak E vel 167 cm/s     -----                                Peak A vel 136 cm/s     -----                                Mean vel,  115 cm/s     -----                                D                                Decelerati 275 ms       150-2  on time                 30                                Pressure    80 ms       -----                                half-time                                Mean         6 mm Hg    -----                                gradient,                                D                                Peak        11 mm Hg    -----                                gradient,                                D                                Peak E/A   1.2          -----                                ratio                                Area (PHT) 2.7 cm^2     -----                                             5                                 Area index 1.4 cm^2/m^2 -----                                (PHT)        2                                Area index 0.8 cm^2/m^2 -----                                (  LVOT        8                                cont)                                Annulus    50. cm       -----                                VTI          2                                Tricuspid valve                                Regurg     267 cm/s     -----                                peak vel                                Peak RV-RA  29 mm Hg    -----                                gradient,                                S                                Systemic veins                                Estimated    5 mm Hg    -----                                CVP                                Right ventricle                                Pressure,   34 mm Hg    <30                                S                                Sa vel,    8.0 cm/s     -----  lat ann,     1                                tiss DP   ------------------------------------------------------------ Prepared and Electronically Authenticated by  Willa Rough, MD, [facility] 2013-04-01T09:39:[id number]    Impression:  Patient is doing very well nearly 6 months status post mitral valve repair and coronary artery bypass grafting.  Plan:  The future the patient will call and return to see [place] as needed. All of her questions been addressed.   Salvatore Decent. Cornelius Moras, MD 04/16/2012 12:50 PM

## 2012-05-15 ENCOUNTER — Other Ambulatory Visit: Payer: Self-pay | Admitting: *Deleted

## 2012-05-15 ENCOUNTER — Other Ambulatory Visit: Payer: Self-pay | Admitting: Physician Assistant

## 2012-05-15 MED ORDER — CARVEDILOL 12.5 MG PO TABS: 18.7500 mg | ORAL_TABLET | Freq: Two times a day (BID) | ORAL | Status: DC

## 2012-05-15 NOTE — Telephone Encounter (Signed)
rx sent in today for coreg 12.5 mg take 1 and 1/2 tab bid, pharmacy aware

## 2012-05-15 NOTE — Telephone Encounter (Signed)
verified dose of 12.5 mg tab 1 1/2 tab bid

## 2012-07-09 ENCOUNTER — Encounter (HOSPITAL_COMMUNITY): Payer: Self-pay | Admitting: Dentistry

## 2012-10-09 ENCOUNTER — Other Ambulatory Visit: Payer: Self-pay | Admitting: Internal Medicine

## 2012-11-29 ENCOUNTER — Other Ambulatory Visit: Payer: Self-pay | Admitting: Internal Medicine

## 2012-12-01 ENCOUNTER — Other Ambulatory Visit: Payer: Self-pay | Admitting: Internal Medicine

## 2012-12-28 ENCOUNTER — Other Ambulatory Visit: Payer: Self-pay | Admitting: Internal Medicine

## 2012-12-31 ENCOUNTER — Other Ambulatory Visit: Payer: Self-pay | Admitting: Internal Medicine

## 2013-01-09 ENCOUNTER — Encounter (HOSPITAL_COMMUNITY): Payer: Self-pay | Admitting: Dentistry

## 2013-01-30 ENCOUNTER — Encounter (HOSPITAL_COMMUNITY): Payer: Self-pay | Admitting: Dentistry

## 2013-01-30 ENCOUNTER — Ambulatory Visit (HOSPITAL_COMMUNITY): Payer: Self-pay | Admitting: Dentistry

## 2013-01-30 VITALS — BP 192/86 | HR 71 | Temp 98.5°F

## 2013-01-30 DIAGNOSIS — K0820 Unspecified atrophy of edentulous alveolar ridge: Secondary | ICD-10-CM

## 2013-01-30 DIAGNOSIS — Z463 Encounter for fitting and adjustment of dental prosthetic device: Secondary | ICD-10-CM

## 2013-01-30 DIAGNOSIS — K08109 Complete loss of teeth, unspecified cause, unspecified class: Secondary | ICD-10-CM

## 2013-01-30 DIAGNOSIS — Z972 Presence of dental prosthetic device (complete) (partial): Secondary | ICD-10-CM

## 2013-01-30 NOTE — Progress Notes (Signed)
01/30/2013  Patient:            Ashley [name] Date of Birth:  12/10/1965 MRN:                [id number]  BP 192/86  Pulse 71  Temp(Src) 98.5 F (36.9 C) (Oral)  Ashley [name] is a 47 year old  that presents for periodic oral exam and evaluation of upper and lower dentures. The patient in is aware of her elevated blood pressure today. Patient denies symptoms of headache, dizziness, or other adverse effects.  Patient was instructed to follow up with her primary physician for evaluation of blood pressure medication.  Medical Hx Update:  Past Medical History  Diagnosis Date  . Abdominal pain     ? chronic cholecystitis; admxn to [facility] 9/12 (CT, USN, HIDA done)  . Stroke     in setting of cigs and OCPs  . Cardiomyopathy     echo 9/12: mild LVH, EF 35-40%, mod MR (difficult to judge /post leaflet restricted), mod BAE, mod RVE, mod TR  . HTN (hypertension)   . Tobacco abuse   . Heart murmur   . Coronary artery disease   . Mitral regurgitation   . Recurrent upper respiratory infection (URI)     COMPLETED 10 DAY COARSE  ABX .COMPLETED 10/09/11  . 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 ALLERGIES/ADVERSE DRUG REACTIONS: Allergies  Allergen Reactions  . Azithromycin Nausea Only   MEDICATIONS: Current Outpatient Prescriptions  Medication Sig Dispense Refill  . aspirin EC 81 MG tablet Take 1 tablet (81 mg total) by mouth daily.      . carvedilol (COREG) 12.5 MG tablet take 1 and 1/2 tablets by mouth twice a day (MAY TAKE 1 EXTRA AS NEEDED.)  95 tablet  6  . famotidine (PEPCID) 20 MG tablet Take 20 mg by mouth daily as needed. For indigestion      . lisinopril (PRINIVIL,ZESTRIL) 5 MG tablet take 1 tablet by mouth once daily  30 tablet  2  . simvastatin (ZOCOR) 20 MG tablet Take 1 tablet (20 mg total) by mouth at bedtime.  30 tablet  6  . vitamin C (ASCORBIC ACID) 250 MG tablet Take 250 mg by mouth daily.      . [DISCONTINUED] amiodarone (PACERONE) 200 MG tablet Take  200 mg by mouth 2 (two) times daily. Begin 7 days prior to surgery.      . [DISCONTINUED] amLODipine (NORVASC) 10 MG tablet Take 1 tablet (10 mg total) by mouth daily.  30 tablet  11  . [DISCONTINUED] furosemide (LASIX) 40 MG tablet Take 1 tablet (40 mg total) by mouth daily.  5 tablet  0  . [DISCONTINUED] potassium chloride SA (K-DUR,KLOR-CON) 20 MEQ tablet Take 1 tablet (20 mEq total) by mouth daily.  5 tablet  0   No current facility-administered medications for this visit.    C/C: No problems with dentures. HPI:  Ashley [name] is a 47 year old female initially seen in October 2012 for a pre-heart valve surgery dental protocol evaluation. The patient had extraction of remaining teeth with alveoloplasty and pre-prosthetic surgery as indicated on 06/16/2011. After adequate healing, the upper and lower complete dentures were fabricated and inserted on 01/05/2012. Patient has been seen for multiple adjustments. Patient had a broken appointment in November of 2013. The patient now presents for periodic oral examination and evaluation of upper and lower complete dentures.  DENTAL EXAM: General: The patient is  a well-developed, well-nourished female in no acute distress. Vitals: BP 192/86  Pulse 71  Temp(Src) 98.5 F (36.9 C) (Oral) Extraoral Exam: There is no palpable submandibular lymphadenopathy. The patient denies acute TMJ symptoms. Intraoral  Exam: The patient has normal saliva. Do not see any evidence of denture irritation or erythema.  There is atrophy of the edentulous alveolar ridges. Dentition: The patient is edentulous. Prosthodontic: The patient has an upper and lower complete denture. These are stable and retentive although upper and lower complete denture lab relines may benefit the patient. Pressure indicating paste was added to the upper and lower complete dentures. Adjustments were made as needed. The dentures were then polished. Occlusion: The occlusion is acceptable. No  adjustments were required today.  Assessments: 1. Edentulous 2. Atrophy of edentulous alveolar ridges 3. Acceptable upper and lower complete dentures. We did discuss upper and lower denture lab relines. A quote of fees was provided. Patient will think about proceeding with lab relines in the future and call for an appointment as indicated.  Plan: 1. Return to clinic in one year for denture recall. Call if problems arise before then. Patient is to keep dentures out if sore spots arise. Patient to use salt water rinses as needed to aid healing. Patient to call if she wishes to proceed with upper and lower complete denture lab relines. Patient was dismissed in stable condition   Karoline Caldwell

## 2013-01-30 NOTE — Patient Instructions (Signed)
Return to clinic in one year for denture recall.  Call if problems arise before then.  Patient is to keep dentures out if sore spots arise.  Patient to use salt water rinses as needed to aid healing.  Patient to call if she wishes to proceed with upper and lower complete denture lab relines.  Dr. Kristin Bruins

## 2013-03-01 ENCOUNTER — Other Ambulatory Visit: Payer: Self-pay | Admitting: Internal Medicine

## 2013-03-04 ENCOUNTER — Other Ambulatory Visit: Payer: Self-pay | Admitting: *Deleted

## 2013-03-04 MED ORDER — LISINOPRIL 5 MG PO TABS: 5.0000 mg | ORAL_TABLET | Freq: Every day | ORAL | Status: DC

## 2013-04-03 ENCOUNTER — Telehealth: Payer: Self-pay

## 2013-04-03 ENCOUNTER — Other Ambulatory Visit: Payer: Self-pay | Admitting: Internal Medicine

## 2013-04-03 MED ORDER — LISINOPRIL 5 MG PO TABS: 5.0000 mg | ORAL_TABLET | Freq: Every day | ORAL | Status: DC

## 2013-04-03 NOTE — Telephone Encounter (Signed)
pt called to rqst refill for lisinopril. appt made for 06/07/13 w/Dr.Ross

## 2013-06-03 ENCOUNTER — Other Ambulatory Visit: Payer: Self-pay | Admitting: Internal Medicine

## 2013-06-06 ENCOUNTER — Other Ambulatory Visit: Payer: Self-pay

## 2013-06-06 MED ORDER — CARVEDILOL 12.5 MG PO TABS: ORAL_TABLET | ORAL | Status: DC

## 2013-06-07 ENCOUNTER — Ambulatory Visit (INDEPENDENT_AMBULATORY_CARE_PROVIDER_SITE_OTHER): Payer: BC Managed Care – PPO | Admitting: Internal Medicine

## 2013-06-07 ENCOUNTER — Encounter: Payer: Self-pay | Admitting: Internal Medicine

## 2013-06-07 VITALS — BP 152/100 | HR 67 | Ht 68.0 in | Wt 215.0 lb

## 2013-06-07 DIAGNOSIS — I2510 Atherosclerotic heart disease of native coronary artery without angina pectoris: Secondary | ICD-10-CM

## 2013-06-07 MED ORDER — AMLODIPINE BESYLATE 5 MG PO TABS: 5.0000 mg | ORAL_TABLET | Freq: Every day | ORAL | Status: DC

## 2013-06-07 MED ORDER — SIMVASTATIN 20 MG PO TABS: ORAL_TABLET | ORAL | Status: DC

## 2013-06-07 MED ORDER — LISINOPRIL 10 MG PO TABS: 10.0000 mg | ORAL_TABLET | Freq: Every day | ORAL | Status: DC

## 2013-06-07 MED ORDER — CARVEDILOL 12.5 MG PO TABS: ORAL_TABLET | ORAL | Status: DC

## 2013-06-07 NOTE — Progress Notes (Signed)
HPI Patient is a 47 year old with a history of CAD and MR  S/p CABG x 4 and MV repair March 2013  I saw her in August 2013.   She denies CP  Breathing is OK  No palpitiatons.  Echo in APril 2013 LVEF was normal. Allergies  Allergen Reactions  . Azithromycin Nausea Only    Current Outpatient Prescriptions  Medication Sig Dispense Refill  . aspirin EC 81 MG tablet Take 1 tablet (81 mg total) by mouth daily.      . carvedilol (COREG) 12.5 MG tablet take 1 and 1/2 tablets by mouth twice a day (MAY TAKE 1 EXTRA AS NEEDED)  95 tablet  0  . famotidine (PEPCID) 20 MG tablet Take 20 mg by mouth daily as needed. For indigestion      . lisinopril (PRINIVIL,ZESTRIL) 5 MG tablet Take 1 tablet (5 mg total) by mouth daily.  30 tablet  2  . simvastatin (ZOCOR) 20 MG tablet take 1 tablet by mouth at bedtime  30 tablet  1  . vitamin C (ASCORBIC ACID) 250 MG tablet Take 250 mg by mouth daily.      . [DISCONTINUED] amiodarone (PACERONE) 200 MG tablet Take 200 mg by mouth 2 (two) times daily. Begin 7 days prior to surgery.      . [DISCONTINUED] amLODipine (NORVASC) 10 MG tablet Take 1 tablet (10 mg total) by mouth daily.  30 tablet  11  . [DISCONTINUED] furosemide (LASIX) 40 MG tablet Take 1 tablet (40 mg total) by mouth daily.  5 tablet  0  . [DISCONTINUED] potassium chloride SA (K-DUR,KLOR-CON) 20 MEQ tablet Take 1 tablet (20 mEq total) by mouth daily.  5 tablet  0   No current facility-administered medications for this visit.    Past Medical History  Diagnosis Date  . Abdominal pain     ? chronic cholecystitis; admxn to [facility] 9/12 (CT, USN, HIDA done)  . Stroke     in setting of cigs and OCPs  . Cardiomyopathy     echo 9/12: mild LVH, EF 35-40%, mod MR (difficult to judge /post leaflet restricted), mod BAE, mod RVE, mod TR  . HTN (hypertension)   . Tobacco abuse   . Heart murmur   . Coronary artery disease   . Mitral regurgitation   . Recurrent upper respiratory infection (URI)     COMPLETED 10  DAY COARSE  ABX .COMPLETED 10/09/11  . 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 Tonsillectomy    . Tubal ligation    . Cesarean section    . Cesarean section    . Tee without cardioversion  06/30/2011    Procedure: TRANSESOPHAGEAL ECHOCARDIOGRAM (TEE);  Surgeon: Pricilla Riffle, MD;  Location: [facility] ENDOSCOPY;  Service: Cardiovascular;  Laterality: N/A;  . Mitral valve repair  10/21/2011    Procedure: MITRAL VALVE REPAIR (MVR);  Surgeon: Purcell Nails, MD;  Location[facility] OR;  Service: Open Heart Surgery;  Laterality: N/A;  . Coronary artery bypass graft  10/21/2011    Procedure: CORONARY ARTERY BYPASS GRAFTING (CABG);  Surgeon: Purcell Nails, MD;  Location: [facility] OR;  Service: Open Heart Surgery;  Laterality: N/A;  cabg x four, using right leg greater saphenous vein harvested endoscopically  . Cardiac catheterization    . Dental surgery      Family History  Problem Relation Age of Onset  . Stroke  Neg Hx   . Anesthesia problems Neg Hx   . Hypotension Neg Hx   . Malignant hyperthermia Neg Hx   . Pseudochol deficiency Neg Hx   . Hypertension Mother   . Heart disease Father   . COPD Father     History   Social History  . Marital Status: Married    Spouse Name: N/A    Number of Children: N/A  . Years of Education: N/A   Occupational History  . Not on file.   Social History Main Topics  . Smoking status: Former Smoker -- 1.00 packs/day for 30 years    Quit date: 05/07/2011  . Smokeless tobacco: Former Neurosurgeon    Quit date: 06/06/2011  . Alcohol Use: No  . Drug Use: No  . Sexual Activity: Yes   Other Topics Concern  . Not on file   Social History Narrative  . No narrative on file    Review of Systems:  All systems reviewed.  They are negative to the above problem except as previously stated.  Vital Signs: BP 152/100  Pulse 67  Ht 5\' 8"  (1.727 m)  Wt 215 lb (97.523 kg)  BMI 32.7 kg/m2  SpO2 98% BP on  my check 180/100 Physical Exam Patient is in NAD HEENT:  Normocephalic, atraumatic. EOMI, PERRLA.  Neck: JVP is normal. No thyromegaly. No bruits.  Lungs: clear to auscultation. No rales no wheezes.  Heart: Regular rate and rhythm. Normal S1, S2. No S3.   No significant murmurs. PMI not displaced.  Abdomen:  Supple, nontender. Normal bowel sounds. No masses. No hepatomegaly.  Extremities:   Good distal pulses throughout. No lower extremity edema.  Musculoskeletal :moving all extremities.  Neuro:   alert and oriented x3.  CN II-XII grossly intact.   Assessment and Plan:  1.  CAD  No symptoms of angina.  Encouraged her to stay active.    2.  MV disease.  S/P MV reparir.  Excellent result.  3  HL  Continue statin.  Check lipids    4.  HTN  BP is elevated.  Will increase lisinopril and add back amlodipine 5  Encouraged her to get a bp cuff  F/U for BP check in 6 to 8 wks Otherwise F/U in April/May

## 2013-06-07 NOTE — Patient Instructions (Signed)
Your physician has recommended you make the following change in your medication: START Amlodipine 5mg  take one by mouth once a day, INCREASE Lisinopril to 10mg  take one by mouth daily  Your physician recommends that you schedule a follow-up appointment in: 8 WEEKS for BP check with the nurse  Your physician wants you to follow-up in: April 2015 with Dr Tenny Craw.  You will receive a reminder letter in the mail two months in advance. If you don't receive a letter, please call our office to schedule the follow-up appointment.

## 2013-07-07 IMAGING — CT CT CHEST W/ CM
2 of 3 series · 13 of 36 positions shown, 16 images · IV contrast (CONTRAST)
Comparison: [DATE] hours the same day and earlier.
COMPARISON: [DATE] hours the same day and earlier.

<!--  IDXRADR:ADDEND:BEGIN -->Addendum Begins
<!--  IDXRADR:ADDEND:INNER_BEGIN -->***ADDENDUM*** CREATED: 10/25/2011 [DATE]

The second sentence of the impression #1 should read:
"Left lower lung consolidation and an area of trapped GAS or
cavitary change (series 3 image 32)."
Also I discussed this case by telephone with Dr. Alma.  As we
discussed the small volume of exudative fluid in the left chest
could simply be postoperative blood or conceivably related to a
left lung infection.
CLINICAL DATA: 45-year-old female with CABG 4 days ago.  Left lung
collapse on chest x-ray, query pneumothorax.
CT CHEST WITH CONTRAST
TECHNIQUE: Multidetector CT imaging of the chest was performed
following the standard protocol during bolus administration of
intravenous contrast.
Contrast:  60 ml Amnipaque-PVV.

[Series 3: chest with · axial · 0.68mm/px · z∈[-249,-19]mm · 10 of 55 slices shown, 13 images]
[im 5/55  mediastinal]
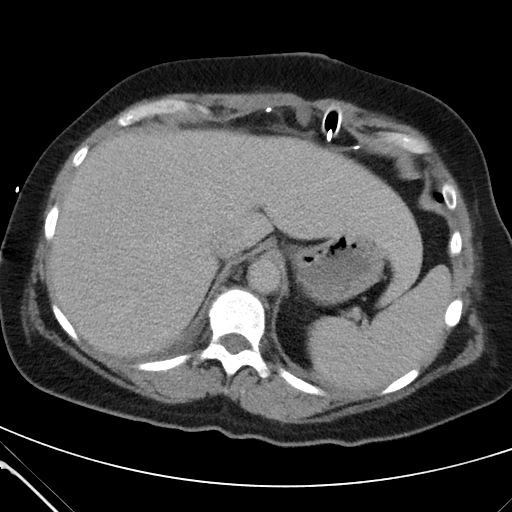
[im 5/55  lung]
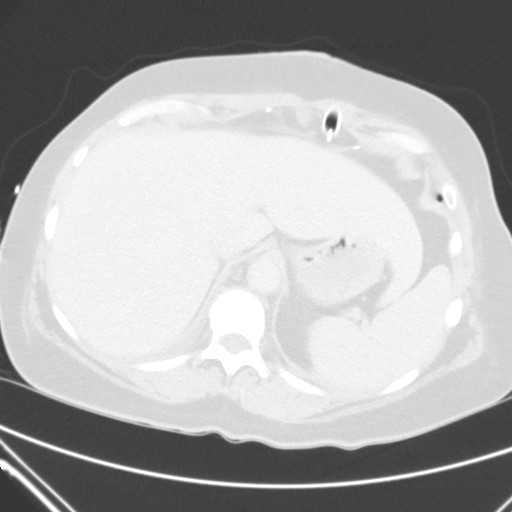
[im 9/55  lung]
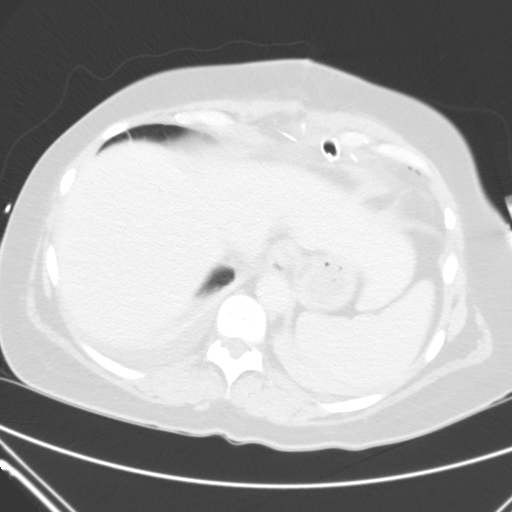
[im 15/55  lung]
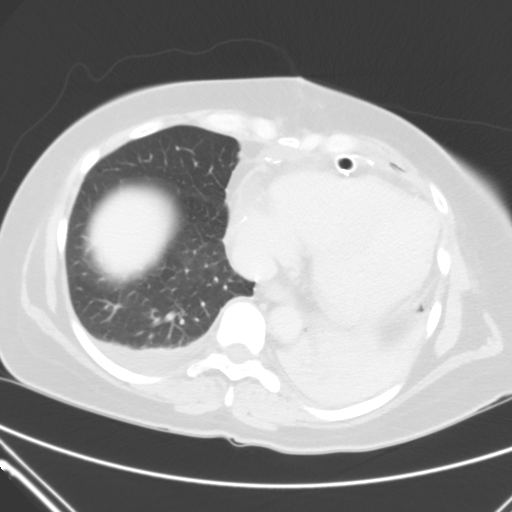
[im 21/55  lung]
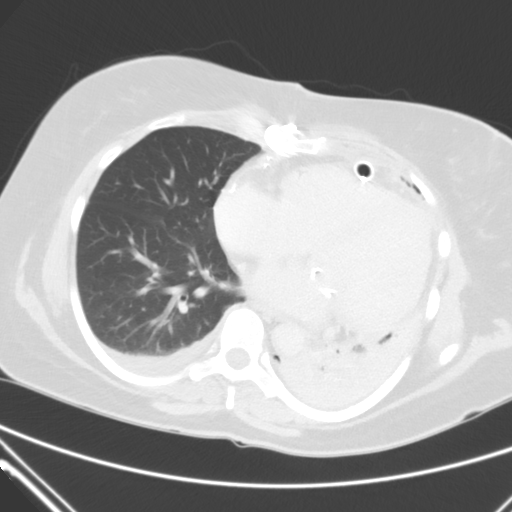
[im 25/55  mediastinal]
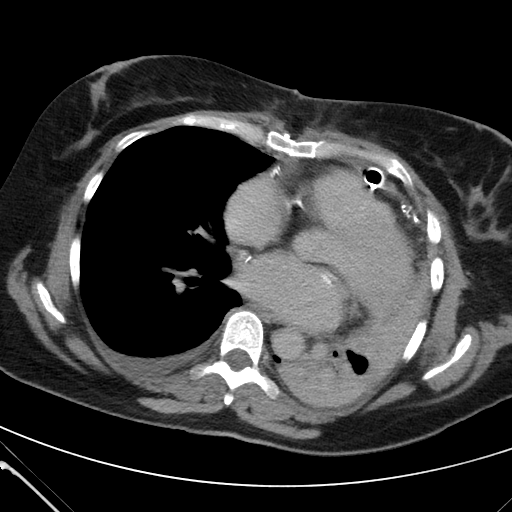
[im 25/55  lung]
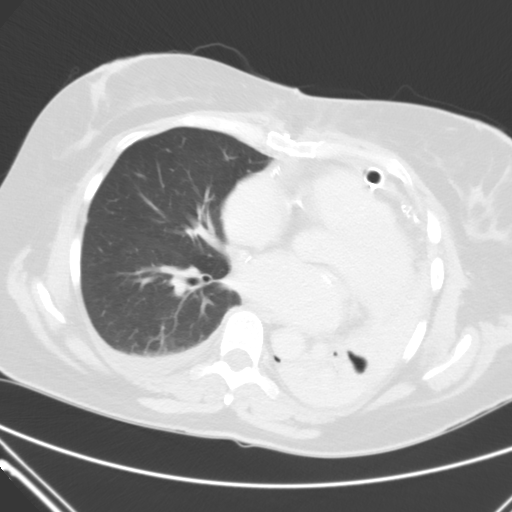
[im 31/55  lung]
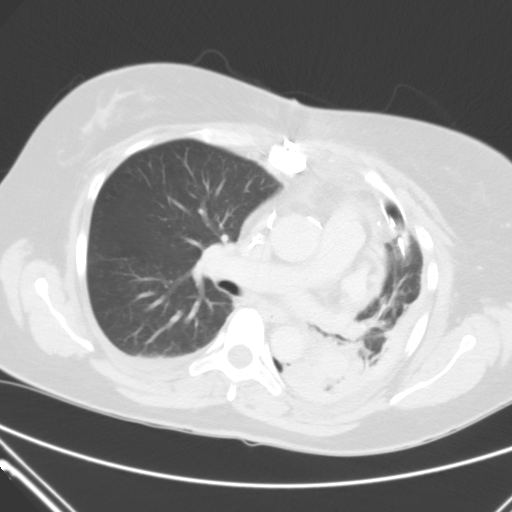
[im 35/55  lung]
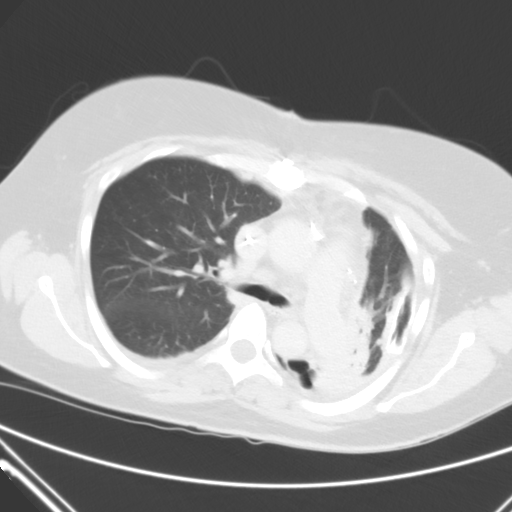
[im 41/55  lung]
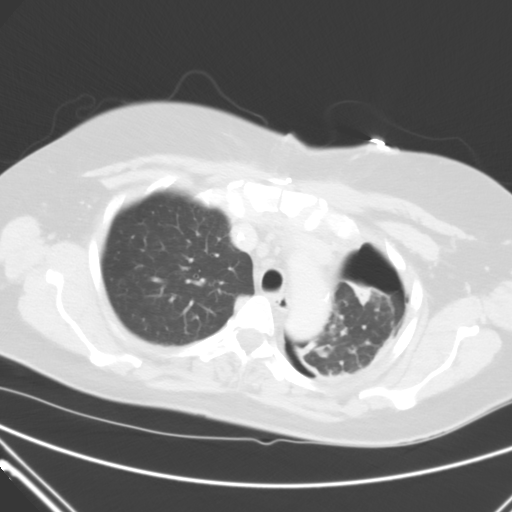
[im 47/55  mediastinal]
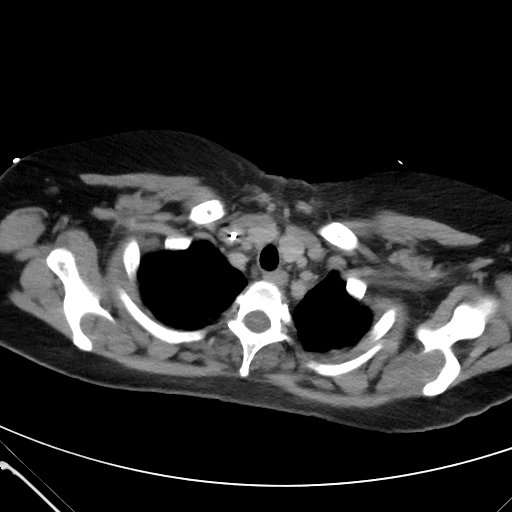
[im 47/55  lung]
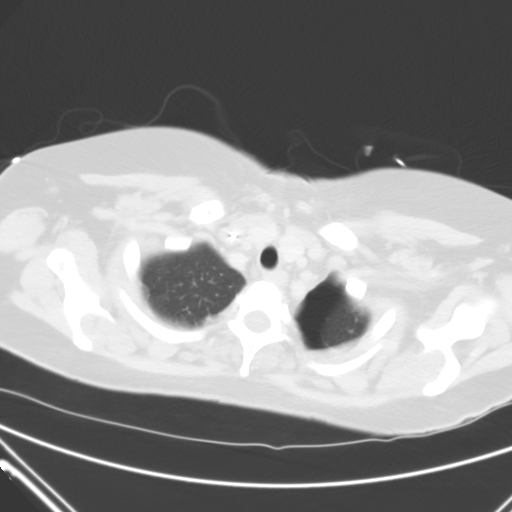
[im 51/55  lung]
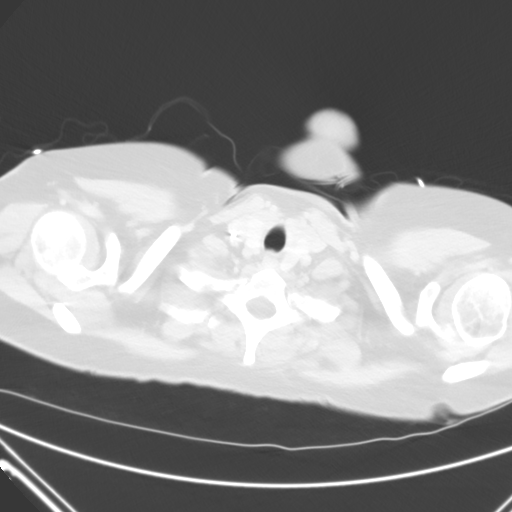

[coronals · coronal · 0.68mm/px · 3 of 91 slices shown]
[im 19/91  lung]
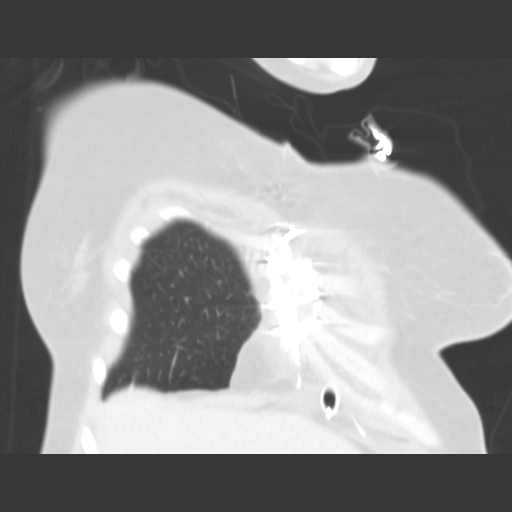
[im 37/91  lung]
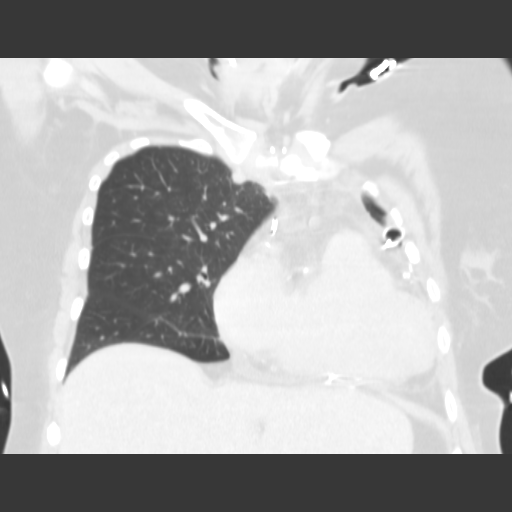
[im 55/91  lung]
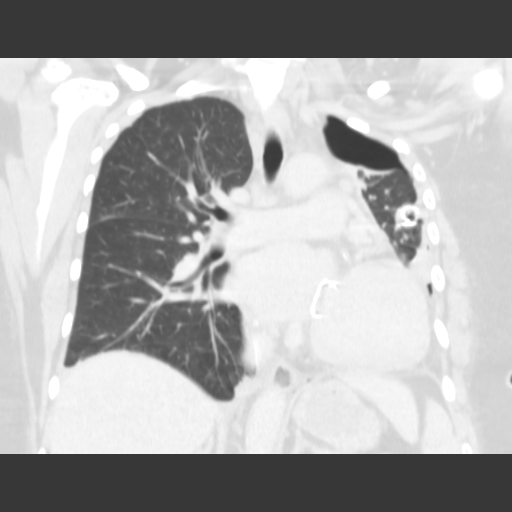

[13 of 36 positions shown; findings below may reference images not displayed]

FINDINGS: An anterior approach left side chest tube is in place.
The tube terminates at the level of the left hilum laterally.
There is medial collapse of the left upper lobe.  The left mainstem
bronchus tapers and is not very patent through the left hilum.
There is consolidation of the lower left lung with some air
bronchograms.  There is a tract pocket of gas or cavitary area
within the lung parenchyma on series 3 image 32.  Additionally,
there are is hyperdensity in the left costophrenic sulcus
suspicious for hyperdense fluid such as blood.  The volume of this
fluid is small.  There is no pericardial effusion evident.

On the right there is a small layering low density effusion with
minor atelectasis.  The trachea and right major airways are patent.

Right IJ approach central venous catheter terminates at the
cavoatrial junction. Cardiomegaly.  Sequelae of CABG and median
sternotomy.  No retrosternal hematoma.

Major mediastinal vascular structures such as the aorta and central
pulmonary arteries appear patent.

Negative visualized upper abdominal viscera.  Thoracic inlet within
normal limits.

Chronic left lateral rib fractures.  No definite acute rib
fracture.  Sequelae of sternotomy.  No acute osseous abnormality in
the spine.
IMPRESSION: 1.  Left chest tube in place with small pneumothorax.  Left lower
lung consolidation and an area of trapped fluid or cavitary change
(series 3 image 32).
2.  There does appear to be a small volume left hemothorax.
3.  Small simple appearing right pleural effusion with only minor
atelectasis.
4.  No pericardial effusion.  Cardiomegaly and sequelae of CABG.

Study discussed by telephone with nurse Milda at the bedside on
10/25/2011 at pneumonia.  She is passing along the information to
the covering surgeon who I requested call me directly with any
questions in this case..
<!--  IDXRADR:ADDEND:INNER_END -->Addendum Ends
<!--  IDXRADR:ADDEND:END -->*RADIOLOGY REPORT*
FINDINGS: An anterior approach left side chest tube is in place.
The tube terminates at the level of the left hilum laterally.
There is medial collapse of the left upper lobe.  The left mainstem
bronchus tapers and is not very patent through the left hilum.
There is consolidation of the lower left lung with some air
bronchograms.  There is a tract pocket of gas or cavitary area
within the lung parenchyma on series 3 image 32.  Additionally,
there are is hyperdensity in the left costophrenic sulcus
suspicious for hyperdense fluid such as blood.  The volume of this
fluid is small.  There is no pericardial effusion evident.

On the right there is a small layering low density effusion with
minor atelectasis.  The trachea and right major airways are patent.

Right IJ approach central venous catheter terminates at the
cavoatrial junction. Cardiomegaly.  Sequelae of CABG and median
sternotomy.  No retrosternal hematoma.

Major mediastinal vascular structures such as the aorta and central
pulmonary arteries appear patent.

Negative visualized upper abdominal viscera.  Thoracic inlet within
normal limits.

Chronic left lateral rib fractures.  No definite acute rib
fracture.  Sequelae of sternotomy.  No acute osseous abnormality in
the spine.
IMPRESSION: 1.  Left chest tube in place with small pneumothorax.  Left lower
lung consolidation and an area of trapped fluid or cavitary change
(series 3 image 32).
2.  There does appear to be a small volume left hemothorax.
3.  Small simple appearing right pleural effusion with only minor
atelectasis.
4.  No pericardial effusion.  Cardiomegaly and sequelae of CABG.

Study discussed by telephone with nurse Milda at the bedside on
10/25/2011 at pneumonia.  She is passing along the information to
the covering surgeon who I requested call me directly with any
questions in this case..

## 2013-07-09 IMAGING — CR DG CHEST 2V
2 series · 2 of 2 positions shown · non-contrast
Comparison: 10/26/2011

CLINICAL DATA: Left lung consolidation and atelectasis, follow-up

CHEST - 2 VIEW

[w chest pa]
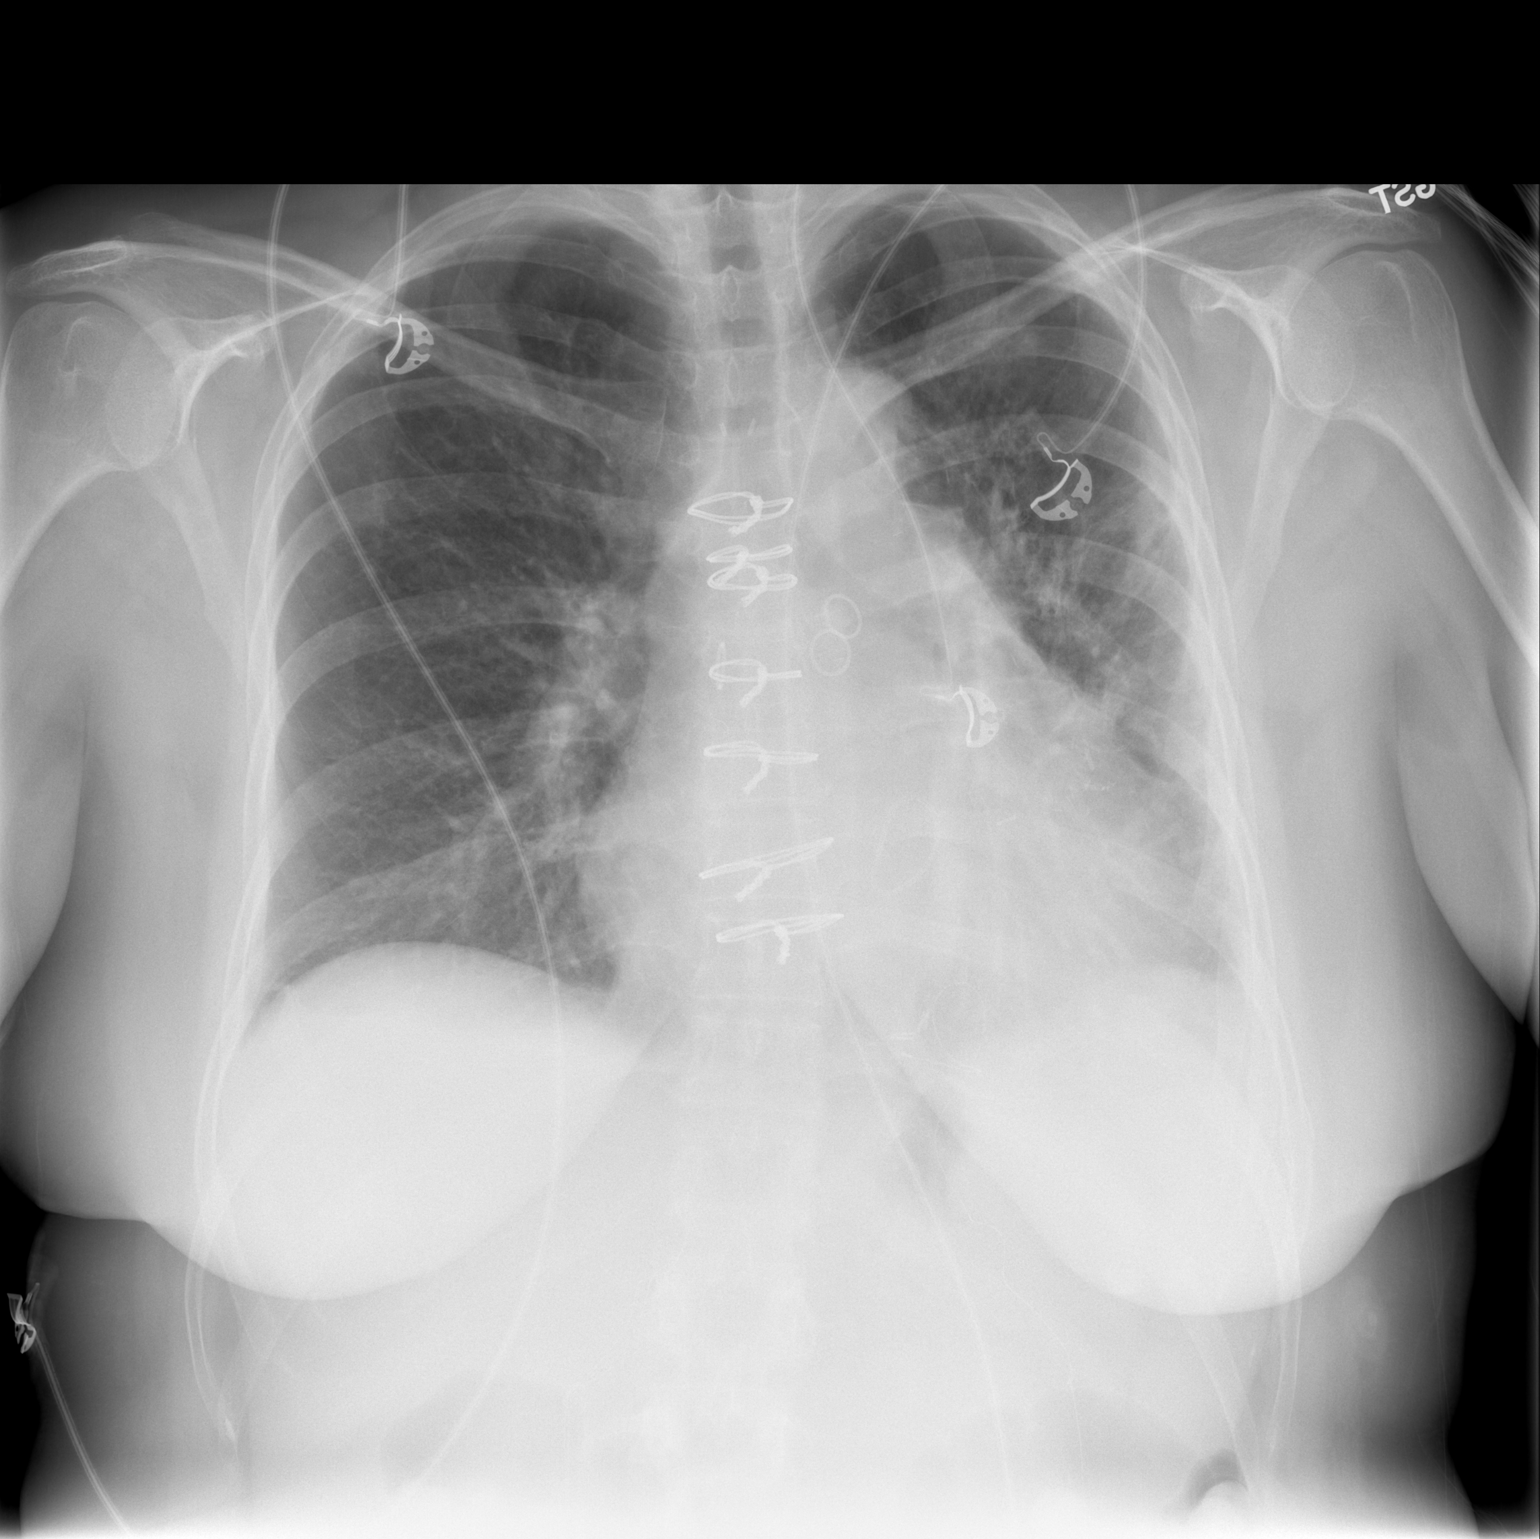

[w chest lat]
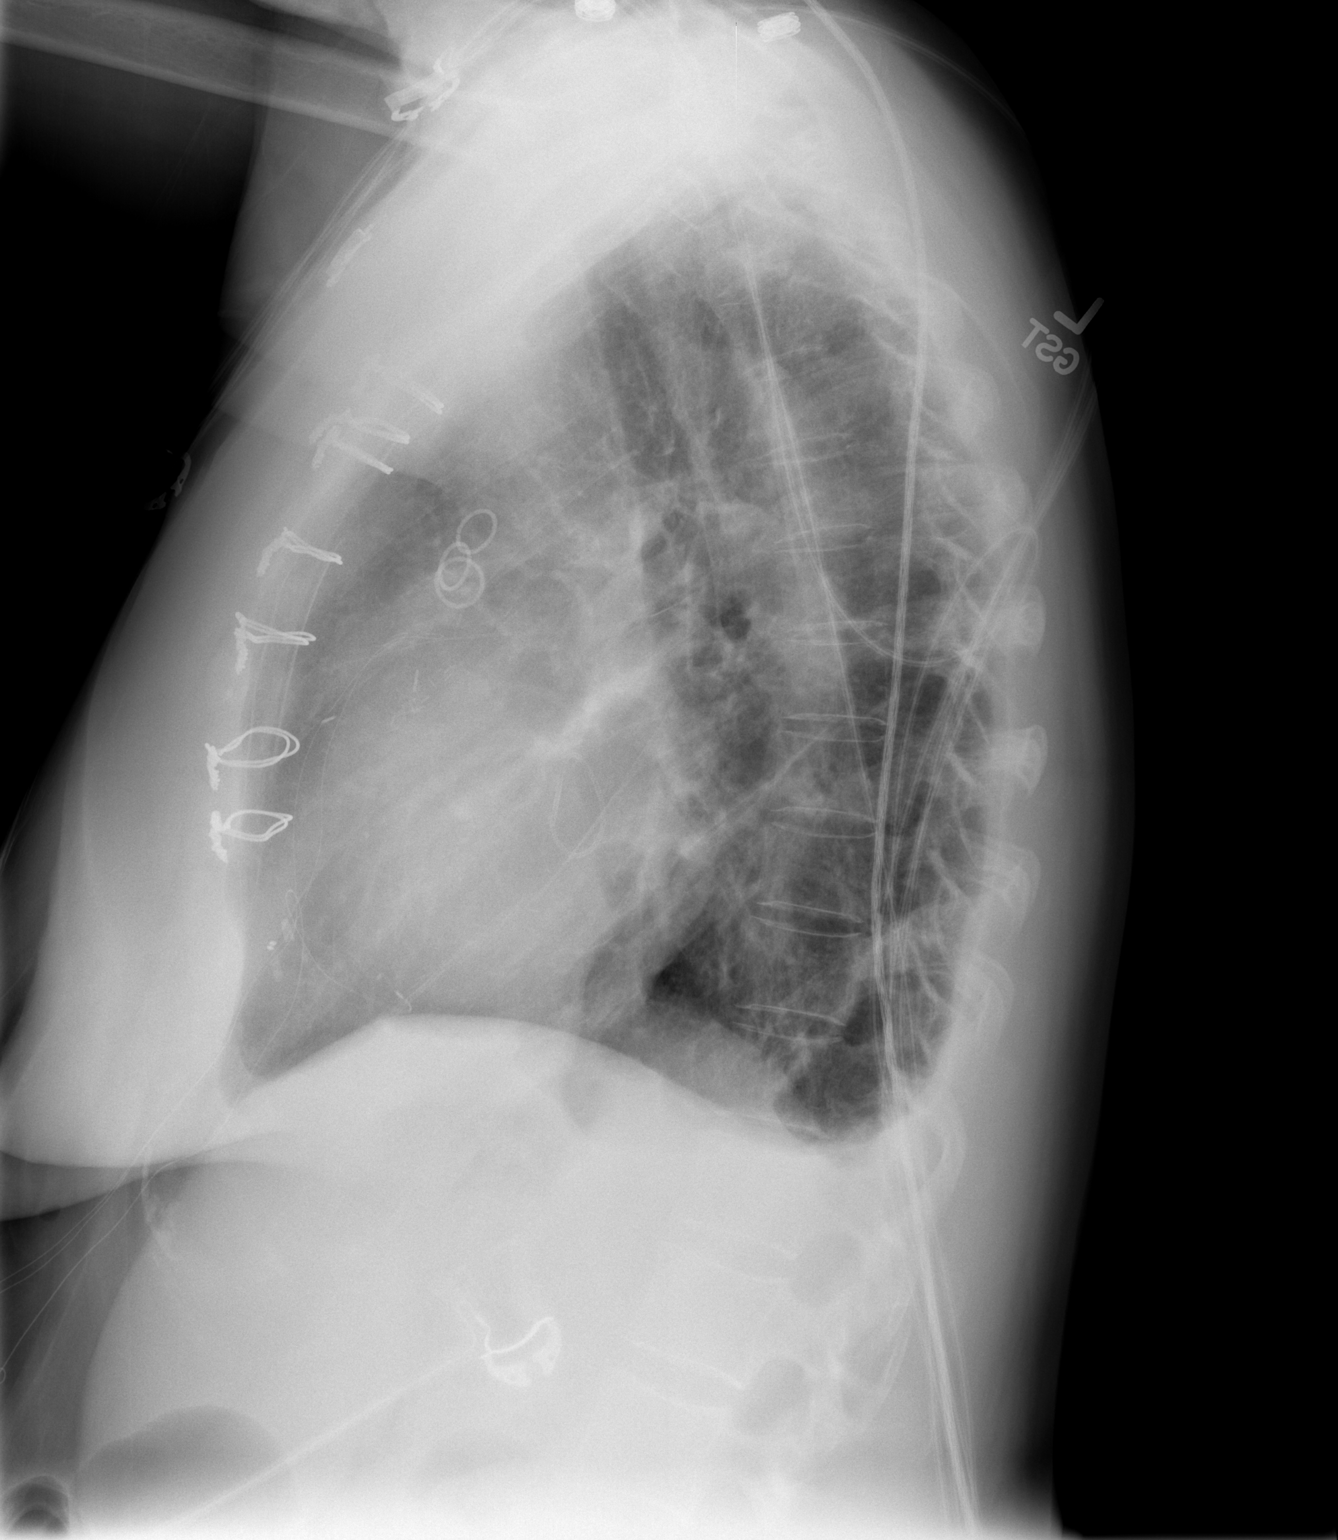

[2 of 2 positions shown; findings below may reference images not displayed]

FINDINGS: Enlargement of cardiac silhouette post CABG and MVR.
Atherosclerotic calcification aortic arch.
Pulmonary vascularity normal.
Persistent infiltrate left mid lung.
Remaining lungs clear.
Small bibasilar effusions.
Question underlying emphysematous changes.
No pneumothorax.
IMPRESSION: Enlargement of cardiac silhouette post CABG and MVR.
Mild persistent infiltrate left mid lung with small bibasilar
effusions.

## 2013-08-07 ENCOUNTER — Ambulatory Visit (INDEPENDENT_AMBULATORY_CARE_PROVIDER_SITE_OTHER): Payer: Self-pay | Admitting: *Deleted

## 2013-08-07 ENCOUNTER — Ambulatory Visit (INDEPENDENT_AMBULATORY_CARE_PROVIDER_SITE_OTHER): Payer: BC Managed Care – PPO | Admitting: *Deleted

## 2013-08-07 ENCOUNTER — Encounter: Payer: Self-pay | Admitting: *Deleted

## 2013-08-07 VITALS — BP 146/82 | HR 78 | Wt 214.8 lb

## 2013-08-07 DIAGNOSIS — I10 Essential (primary) hypertension: Secondary | ICD-10-CM

## 2013-08-07 DIAGNOSIS — E785 Hyperlipidemia, unspecified: Secondary | ICD-10-CM

## 2013-08-07 DIAGNOSIS — I2510 Atherosclerotic heart disease of native coronary artery without angina pectoris: Secondary | ICD-10-CM

## 2013-08-07 LAB — BASIC METABOLIC PANEL
BUN: 7 mg/dL (ref 6–23)
CO2: 27 mEq/L (ref 19–32)
Calcium: 9 mg/dL (ref 8.4–10.5)
Chloride: 104 mEq/L (ref 96–112)
Creatinine, Ser: 1 mg/dL (ref 0.4–1.2)
GFR: 65.66 mL/min (ref >60.00)
Glucose, Bld: 103 mg/dL — ABNORMAL HIGH (ref 70–99)
Potassium: 4.3 mEq/L (ref 3.5–5.1)

## 2013-08-07 LAB — LIPID PANEL
Cholesterol: 150 mg/dL (ref 0–200)
HDL: 34.1 mg/dL — ABNORMAL LOW (ref >39.00)
LDL Cholesterol: 76 mg/dL (ref 0–99)
Total CHOL/HDL Ratio: 4
Triglycerides: 198 mg/dL — ABNORMAL HIGH (ref 0.0–149.0)

## 2013-08-07 LAB — AST: AST: 17 U/L (ref 0–37)

## 2013-08-07 NOTE — Progress Notes (Signed)
Pt comes in today for bp check after medictions changes were made at her October visit with Dr. Tenny Craw I placed lab orders as per Dr. Clarise Cruz note of October pt was to have labs drawn. These were not ordered  Pt last had AST, BMP, & Lipids  12/2011.  Pt stated her bp was down to 137/79 with current medication changes  Mylo Red RN

## 2013-08-09 ENCOUNTER — Telehealth: Payer: Self-pay | Admitting: Internal Medicine

## 2013-08-09 NOTE — Telephone Encounter (Signed)
New Problem:  Pt is calling in to hear recent test results.

## 2013-08-09 NOTE — Telephone Encounter (Signed)
Spoke with pt, aware of lab results. 

## 2013-12-27 ENCOUNTER — Ambulatory Visit: Payer: Self-pay | Admitting: Internal Medicine

## 2014-02-04 ENCOUNTER — Other Ambulatory Visit (HOSPITAL_COMMUNITY): Payer: Self-pay | Admitting: Dentistry

## 2014-02-11 ENCOUNTER — Other Ambulatory Visit (HOSPITAL_COMMUNITY): Payer: Self-pay | Admitting: Dentistry

## 2014-02-20 ENCOUNTER — Ambulatory Visit (INDEPENDENT_AMBULATORY_CARE_PROVIDER_SITE_OTHER): Payer: Self-pay | Admitting: Internal Medicine

## 2014-02-20 ENCOUNTER — Encounter (INDEPENDENT_AMBULATORY_CARE_PROVIDER_SITE_OTHER): Payer: Self-pay

## 2014-02-20 VITALS — BP 150/80 | HR 60 | Resp 18 | Wt 203.4 lb

## 2014-02-20 DIAGNOSIS — Z9889 Other specified postprocedural states: Secondary | ICD-10-CM

## 2014-02-20 DIAGNOSIS — E785 Hyperlipidemia, unspecified: Secondary | ICD-10-CM

## 2014-02-20 DIAGNOSIS — I10 Essential (primary) hypertension: Secondary | ICD-10-CM

## 2014-02-20 NOTE — Patient Instructions (Signed)
Your physician recommends that you continue on your current medications as directed. Please refer to the Current Medication list given to you today. Your physician wants you to follow-up in: 10 months with Dr. Harrington Challenger.  You will receive a reminder letter in the mail two months in advance. If you don't receive a letter, please call our office to schedule the follow-up appointment.

## 2014-02-20 NOTE — Progress Notes (Signed)
HPI Patient is a 48 year old with a history of CAD and MR  S/p CABG x 4 and MV repair March 2013   Echo in APril 2013 LVEF was normal. Last seen in clinic in October 2014 Breathing OK    No tobacco No CP Patient says BP at home 135-140 max Feels she is going thru  Menopause   Allergies  Allergen Reactions  . Azithromycin Nausea Only    Current Outpatient Prescriptions  Medication Sig Dispense Refill  . amLODipine (NORVASC) 5 MG tablet Take 1 tablet (5 mg total) by mouth daily.  30 tablet  11  . aspirin EC 81 MG tablet Take 1 tablet (81 mg total) by mouth daily.      . carvedilol (COREG) 12.5 MG tablet take 1 and 1/2 tablets by mouth twice a day (MAY TAKE 1 EXTRA AS NEEDED)  95 tablet  11  . lisinopril (PRINIVIL,ZESTRIL) 10 MG tablet Take 1 tablet (10 mg total) by mouth daily.  30 tablet  11  . simvastatin (ZOCOR) 20 MG tablet take 1 tablet by mouth at bedtime  30 tablet  11  . famotidine (PEPCID) 20 MG tablet Take 20 mg by mouth daily as needed. For indigestion      . vitamin C (ASCORBIC ACID) 250 MG tablet Take 250 mg by mouth daily.      . [DISCONTINUED] amiodarone (PACERONE) 200 MG tablet Take 200 mg by mouth 2 (two) times daily. Begin 7 days prior to surgery.      . [DISCONTINUED] furosemide (LASIX) 40 MG tablet Take 1 tablet (40 mg total) by mouth daily.  5 tablet  0  . [DISCONTINUED] potassium chloride SA (K-DUR,KLOR-CON) 20 MEQ tablet Take 1 tablet (20 mEq total) by mouth daily.  5 tablet  0   No current facility-administered medications for this visit.    Past Medical History  Diagnosis Date  . Abdominal pain     ? chronic cholecystitis; admxn to [facility] 9/12 (CT, USN, HIDA done)  . Stroke     in setting of cigs and OCPs  . Cardiomyopathy     echo 9/12: mild LVH, EF 35-40%, mod MR (difficult to judge /post leaflet restricted), mod BAE, mod RVE, mod TR  . HTN (hypertension)   . Tobacco abuse   . Heart murmur   . Coronary artery disease   . Mitral regurgitation   .  Recurrent upper respiratory infection (URI)     COMPLETED 10 DAY COARSE  ABX .COMPLETED 10/09/11  . 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 Tonsillectomy    . Tubal ligation    . Cesarean section    . Cesarean section    . Tee without cardioversion  06/30/2011    Procedure: TRANSESOPHAGEAL ECHOCARDIOGRAM (TEE);  Surgeon: [name], MD;  Location: [facility] ENDOSCOPY;  Service: Cardiovascular;  Laterality: N/A;  . Mitral valve repair  10/21/2011    Procedure: MITRAL VALVE REPAIR (MVR);  Surgeon: [name], MD;  Location: [place];  Service: Open Heart Surgery;  Laterality: N/A;  . Coronary artery bypass graft  10/21/2011    Procedure: CORONARY ARTERY BYPASS GRAFTING (CABG);  Surgeon: [name], MD;  Location: [place];  Service: Open Heart Surgery;  Laterality: N/A;  cabg x four, using right leg greater saphenous vein harvested endoscopically  . Cardiac catheterization    . Dental surgery  Family History  Problem Relation Age of Onset  . Stroke Neg Hx   . Anesthesia problems Neg Hx   . Hypotension Neg Hx   . Malignant hyperthermia Neg Hx   . Pseudochol deficiency Neg Hx   . Hypertension Mother   . Heart disease Father   . COPD Father     History   Social History  . Marital Status: Married    Spouse Name: N/A    Number of Children: N/A  . Years of Education: N/A   Occupational History  . Not on file.   Social History Main Topics  . Smoking status: Former Smoker -- 1.00 packs/day for 30 years    Quit date: 05/07/2011  . Smokeless tobacco: Former Systems developer    Quit date: 06/06/2011  . Alcohol Use: No  . Drug Use: No  . Sexual Activity: Yes   Other Topics Concern  . Not on file   Social History Narrative  . No narrative on file    Review of Systems:  All systems reviewed.  They are negative to the above problem except as previously stated.  Vital Signs: BP 150/80  Pulse 60  Resp 18  Wt 203  lb 6.4 oz (92.262 kg) BP on my check Physical Exam Patient is in NAD HEENT:  Normocephalic, atraumatic. EOMI, PERRLA.  Neck: JVP is normal. No thyromegaly. No bruits.  Lungs: clear to auscultation. No rales no wheezes.  Heart: Regular rate and rhythm. Normal S1, S2. No S3.   No significant murmurs. PMI not displaced.  Abdomen:  Supple, nontender. Normal bowel sounds. No masses. No hepatomegaly.  Extremities:   Good distal pulses throughout. No lower extremity edema.  Musculoskeletal :moving all extremities.  Neuro:   alert and oriented x3.  CN II-XII grossly intact.   Assessment and Plan:  1.  CAD  No symptoms of angina.  Encouraged her to stay active.    2.  MV disease.  S/P MV reparir.  Excellent result.  3  HL  Continue statin.  Check lipids    4.  HTN  BP better at home She will bring BP cuff at home .

## 2014-06-19 ENCOUNTER — Other Ambulatory Visit: Payer: Self-pay | Admitting: Internal Medicine

## 2014-06-23 ENCOUNTER — Other Ambulatory Visit: Payer: Self-pay | Admitting: *Deleted

## 2014-06-23 MED ORDER — AMLODIPINE BESYLATE 5 MG PO TABS: 5.0000 mg | ORAL_TABLET | Freq: Every day | ORAL | Status: DC

## 2014-10-21 ENCOUNTER — Encounter: Payer: Self-pay | Admitting: Internal Medicine

## 2015-06-05 ENCOUNTER — Other Ambulatory Visit: Payer: Self-pay | Admitting: Internal Medicine

## 2015-06-08 ENCOUNTER — Other Ambulatory Visit: Payer: Self-pay | Admitting: Internal Medicine

## 2015-06-08 ENCOUNTER — Telehealth: Payer: Self-pay | Admitting: Internal Medicine

## 2015-06-08 NOTE — Telephone Encounter (Signed)
°  STAT if patient is at the pharmacy , call can be transferred to refill team.   1. Which medications need to be refilled? Lisinopril, Amloditime besylate, Simbastatin, Carbedilo  2. Which pharmacy/location is medication to be sent to? [facility] on [address]  3. Do they need a 30 day or 90 day supply? [place]

## 2015-06-09 ENCOUNTER — Other Ambulatory Visit: Payer: Self-pay

## 2015-06-09 NOTE — Telephone Encounter (Signed)
This patient was sent a letter to schedule a followup visit for April. Has not yet done so.

## 2015-06-11 NOTE — Telephone Encounter (Signed)
Please refill for a couple months  Then needs f/u appt

## 2015-06-12 ENCOUNTER — Other Ambulatory Visit: Payer: Self-pay

## 2015-06-12 MED ORDER — CARVEDILOL 12.5 MG PO TABS: ORAL_TABLET | ORAL | Status: DC

## 2015-06-22 ENCOUNTER — Encounter: Payer: Self-pay | Admitting: Physician Assistant

## 2015-06-22 DIAGNOSIS — I10 Essential (primary) hypertension: Secondary | ICD-10-CM | POA: Insufficient documentation

## 2015-06-22 DIAGNOSIS — I429 Cardiomyopathy, unspecified: Secondary | ICD-10-CM | POA: Insufficient documentation

## 2015-06-22 DIAGNOSIS — I6330 Cerebral infarction due to thrombosis of unspecified cerebral artery: Secondary | ICD-10-CM | POA: Insufficient documentation

## 2015-06-22 NOTE — Progress Notes (Signed)
Cardiology Office Note Date:  06/23/2015  Patient ID:  Ashley [name], DOB 1966-05-29, MRN [id number] PCP:  Dorris Carnes, MD  Cardiologist:  Dr. Harrington Challenger  Chief Complaint: f/u CAD, mitral valve disease  History of Present Illness: Ashley [name] is a 49 y.o. 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 Last seen by Dr. Harrington Challenger 02/2014. Last 2D echo 11/2011: mild LVH, EF 55-60%, s/p MV repair without significant MR/MS, mildly dilated RV/RA, PASP 77mmHg. She was on Coumadin for 3 months following MV repair.  She comes in today for routine follow-up doing well. She has not had any CP, SOB, palpitations, syncope, dizziness or bleeding. She says she has remained active without any functional limitation or angina. She has been experiencing symptoms of menopause including hot flashes and emotional lability. She does not have a PCP that she follows with to manage this.   Past Medical History  Diagnosis Date  . Abdominal pain     ? chronic cholecystitis; admxn to [facility] 9/12 (CT, USN, HIDA done)  . Stroke [facility])     a. age 46 - in setting of cigs and OCPs.  . Cardiomyopathy ([place])     a. echo 9/12: mild LVH, EF 35-40%, mod MR (difficult to judge /post leaflet restricted), mod BAE, mod RVE, mod TR. b. F/u echo 11/2011: mild LVH, EF 55-60%, s/p MV repair without significant MR/MS, mildly dilated RV/RA, PASP 29mmHg.   . Essential hypertension   . Tobacco abuse   . Coronary artery disease     a. s/p CABG 10/2011 - LIMA-LAD, SVG-PDA, SVG-OM, SVG-diagonal.  . Mitral regurgitation     a. s/p MV repair 10/2011 - on Coumadin for 3 months afterwards then d/c'd by surgery.  . Recurrent upper respiratory infection (URI)   . 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 Dyslipidemia   . Cardiomyopathy     echo 9/12: mild LVH, EF 35-40%, mod MR (difficult to judge /post leaflet restricted), mod BAE, mod RVE, mod TR     Past Surgical History  Procedure Laterality Date  . Tonsillectomy    . Tubal ligation    . Cesarean section    . Cesarean section    . Tee without cardioversion  06/30/2011    Procedure: TRANSESOPHAGEAL ECHOCARDIOGRAM (TEE);  Surgeon: [name], MD;  [facility] ENDOSCOPY;  Service: Cardiovascular;  Laterality: N/A;  . Mitral valve repair  10/21/2011    Procedure: MITRAL VALVE REPAIR (MVR);  Surgeon: [name], MD;  Location: [place];  Service: Open Heart Surgery;  Laterality: N/A;  . Coronary artery bypass graft  10/21/2011    Procedure: CORONARY ARTERY BYPASS GRAFTING (CABG);  Surgeon: [name], MD;  Location: [place];  Service: Open Heart Surgery;  Laterality: N/A;  cabg x four, using right leg greater saphenous vein harvested endoscopically  . Cardiac catheterization    . Dental surgery      Current Outpatient Prescriptions  Medication Sig Dispense Refill  . amLODipine (NORVASC) 5 MG tablet take 1 tablet by mouth once daily 30 tablet 1  . aspirin EC 81 MG tablet Take 1 tablet (81 mg total) by mouth daily.    . carvedilol (COREG) 12.5 MG tablet Take  12.5 mg by mouth 2 (two) times daily with a meal. Take 1 and 1/2 tablet by mouth twice a day (MAY TAKE 1 EXTRA AS NEED FOR HIGH BP)    . famotidine (PEPCID) 20 MG tablet Take 20 mg by mouth daily as needed. For indigestion    . lisinopril (PRINIVIL,ZESTRIL) 10 MG tablet take 1 tablet by mouth once daily 30 tablet 1  . simvastatin (ZOCOR) 20 MG tablet take 1 tablet by mouth at bedtime 30 tablet 1  . [DISCONTINUED] amiodarone (PACERONE) 200 MG tablet Take 200 mg by mouth 2 (two) times daily. Begin 7 days prior to surgery.    . [DISCONTINUED] furosemide (LASIX) 40 MG tablet Take 1 tablet (40 mg total) by mouth daily. 5 tablet 0  . [DISCONTINUED] potassium chloride SA  (K-DUR,KLOR-CON) 20 MEQ tablet Take 1 tablet (20 mEq total) by mouth daily. 5 tablet 0   No current facility-administered medications for this visit.    Allergies:   Azithromycin   Social History:  The patient  reports that she quit smoking about 4 years ago. She quit smokeless tobacco use about 4 years ago. She reports that she drinks alcohol. She reports that she does not use illicit drugs.   Family History:  The patient's family history includes COPD in her father; Heart disease in her father; Hypertension in her mother. There is no history of Stroke, Anesthesia problems, Hypotension, Malignant hyperthermia, or Pseudochol deficiency.  ROS:  Please see the history of present illness.    All other systems are reviewed and otherwise negative.   PHYSICAL EXAM:  VS:  BP 130/62 mmHg  Pulse 61  Ht 5' 8.5" (1.74 m)  Wt 209 lb 1.9 oz (94.856 kg)  BMI 31.33 kg/m2  LMP 05/18/2015 BMI: Body mass index is 31.33 kg/(m^2). Well nourished, well developed WF, in no acute distress HEENT: normocephalic, atraumatic Neck: no JVD or masses; bilateral carotid bruits L>R Cardiac:  normal S1, S2; RRR; no murmurs, rubs, or gallops Lungs:  clear to auscultation bilaterally, no wheezing, rhonchi or rales Abd: soft, nontender, no hepatomegaly, + BS MS: no deformity or atrophy Ext: no edema Skin: warm and dry, no rash Neuro:  moves all extremities spontaneously, no focal abnormalities noted, follows commands Psych: euthymic mood, full affect   EKG:  Done today shows NSR 61bpm, nonspecific ST-T changes  Recent Labs: No results found for requested labs within last 365 days.  No results found for requested labs within last 365 days.   CrCl cannot be calculated (Patient has no serum creatinine result on file.).   Wt Readings from Last 3 Encounters:  06/23/15 209 lb 1.9 oz (94.856 kg)  02/20/14 203 lb 6.4 oz (92.262 kg)  08/07/13 214 lb 12.8 oz (97.433 kg)     Other studies reviewed: Additional  studies/records reviewed today include: summarized above  ASSESSMENT AND PLAN:  1. CAD s/p CABG - asymptomatic. Continue ASA, BB, statin.  2. H/o mitral valve repair - no significant murmur on exam. Continue clinical observation. Will defer timing of next surveillance echo to Dr. Harrington Challenger. 3. Essential HTN - controlled on present regimen. 4. Dyslipidemia - check lipids, CMET today - she says she has not eaten breakfast yet. I will consider advancing her statin to Lipitor or Crestor based on this result per current guidelines. 5. H/o tobacco abuse - remains abstinent. 6. Bilateral carotid bruits - given history of coronary disease, she is at risk for vascular disease. Check carotid duplex.  Disposition: F/u with Dr.  Ross in 1 year, sooner if needed. I also encouraged her to obtain a PCP to help manage her menopause symptoms. She inquired about an over-the-counter menopause supplement with various herbal ingredients and I told her given her heart and stroke history, she should generally avoid OTC supplements given the lack of safety data in persons with her medical history.  Current medicines are reviewed at length with the patient today.  The patient did not have any concerns regarding medicines.  Raechel Ache PA-C 06/23/2015 9:17 AM     [address] [place]  [place] [id number] [phone] (office)  [phone] (fax)

## 2015-06-23 ENCOUNTER — Encounter: Payer: Self-pay | Admitting: Physician Assistant

## 2015-06-23 ENCOUNTER — Ambulatory Visit (INDEPENDENT_AMBULATORY_CARE_PROVIDER_SITE_OTHER): Payer: Self-pay | Admitting: Physician Assistant

## 2015-06-23 VITALS — BP 130/62 | HR 61 | Ht 68.5 in | Wt 209.1 lb

## 2015-06-23 DIAGNOSIS — E785 Hyperlipidemia, unspecified: Secondary | ICD-10-CM

## 2015-06-23 DIAGNOSIS — R0989 Other specified symptoms and signs involving the circulatory and respiratory systems: Secondary | ICD-10-CM

## 2015-06-23 DIAGNOSIS — I10 Essential (primary) hypertension: Secondary | ICD-10-CM

## 2015-06-23 DIAGNOSIS — R002 Palpitations: Secondary | ICD-10-CM

## 2015-06-23 DIAGNOSIS — Z79899 Other long term (current) drug therapy: Secondary | ICD-10-CM

## 2015-06-23 DIAGNOSIS — Z87891 Personal history of nicotine dependence: Secondary | ICD-10-CM

## 2015-06-23 DIAGNOSIS — Z951 Presence of aortocoronary bypass graft: Secondary | ICD-10-CM

## 2015-06-23 DIAGNOSIS — I2510 Atherosclerotic heart disease of native coronary artery without angina pectoris: Secondary | ICD-10-CM

## 2015-06-23 DIAGNOSIS — Z9889 Other specified postprocedural states: Secondary | ICD-10-CM

## 2015-06-23 LAB — CBC WITH DIFFERENTIAL/PLATELET
BASOS PCT: 1 % (ref 0–1)
Basophils Absolute: 0.1 10*3/uL (ref 0.0–0.1)
Eosinophils Absolute: 0.3 10*3/uL (ref 0.0–0.7)
Eosinophils Relative: 5 % (ref 0–5)
HCT: 36.8 % (ref 36.0–46.0)
HEMOGLOBIN: 12.3 g/dL (ref 12.0–15.0)
Lymphocytes Relative: 22 % (ref 12–46)
Lymphs Abs: 1.5 10*3/uL (ref 0.7–4.0)
MCH: 33 pg (ref 26.0–34.0)
MCHC: 33.4 g/dL (ref 30.0–36.0)
MCV: 98.7 fL (ref 78.0–100.0)
MPV: 10.1 fL (ref 8.6–12.4)
Monocytes Absolute: 1 10*3/uL (ref 0.1–1.0)
Monocytes Relative: 15 % — ABNORMAL HIGH (ref 3–12)
NEUTROS PCT: 57 % (ref 43–77)
Neutro Abs: 3.9 10*3/uL (ref 1.7–7.7)
PLATELETS: 255 10*3/uL (ref 150–400)
RBC: 3.73 MIL/uL — AB (ref 3.87–5.11)
RDW: 12.7 % (ref 11.5–15.5)
WBC: 6.9 10*3/uL (ref 4.0–10.5)

## 2015-06-23 LAB — TSH: TSH: 1.904 u[IU]/mL (ref 0.350–4.500)

## 2015-06-23 LAB — HEMOGLOBIN A1C
Hgb A1c MFr Bld: 5.4 % (ref <5.7)
Mean Plasma Glucose: 108 mg/dL (ref <117)

## 2015-06-23 NOTE — Patient Instructions (Addendum)
Medication Instructions:  Refilled Coreg   Labwork: CBC TSH LIPIDS CMET A1C  Testing/Procedures: CAROTID US     Follow-Up: 1 year with Dr. Harrington Challenger  Any Other Special Instructions Will Be Listed Below (If Applicable). Your physician has requested that you have a carotid duplex. This test is an ultrasound of the carotid arteries in your neck. It looks at blood flow through these arteries that supply the brain with blood. Allow one hour for this exam. There are no restrictions or special instructions.       If you need a refill on your cardiac medications before your next appointment, please call your pharmacy.

## 2015-06-23 NOTE — Addendum Note (Signed)
Addended byDebbora Lacrosse R on: 06/23/2015 10:20 AM   Modules accepted: Orders

## 2015-06-24 ENCOUNTER — Telehealth: Payer: Self-pay

## 2015-06-24 DIAGNOSIS — Z79899 Other long term (current) drug therapy: Secondary | ICD-10-CM

## 2015-06-24 LAB — COMPREHENSIVE METABOLIC PANEL
ALK PHOS: 77 U/L (ref 33–130)
ALT: 17 U/L (ref 6–29)
AST: 16 U/L (ref 10–35)
Albumin: 3.8 g/dL (ref 3.6–5.1)
BILIRUBIN TOTAL: 0.6 mg/dL (ref 0.2–1.2)
BUN: 8 mg/dL (ref 7–25)
CO2: 25 mmol/L (ref 20–31)
Calcium: 9.1 mg/dL (ref 8.6–10.4)
Chloride: 103 mmol/L (ref 98–110)
Creat: 0.9 mg/dL (ref 0.50–1.05)
GLUCOSE: 117 mg/dL — AB (ref 65–99)
Potassium: 5.1 mmol/L (ref 3.5–5.3)
SODIUM: 136 mmol/L (ref 135–146)
Total Protein: 6.5 g/dL (ref 6.1–8.1)

## 2015-06-24 LAB — LIPID PANEL
CHOL/HDL RATIO: 3.5 ratio (ref <=5.0)
Cholesterol: 157 mg/dL (ref 125–200)
HDL: 45 mg/dL — ABNORMAL LOW (ref >=46)
LDL CALC: 91 mg/dL (ref <130)
Triglycerides: 107 mg/dL (ref <150)
VLDL: 21 mg/dL (ref <30)

## 2015-06-24 LAB — CHOLESTEROL, TOTAL: CHOLESTEROL: 157 mg/dL (ref 125–200)

## 2015-06-24 MED ORDER — ATORVASTATIN CALCIUM 40 MG PO TABS: 40.0000 mg | ORAL_TABLET | Freq: Every day | ORAL | Status: DC

## 2015-06-24 NOTE — Telephone Encounter (Signed)
Called patient about lab results. Per Melina Copa PA,  - Blood sugar mildly elevated on labwork but A1C normal at 5.4 - needs to follow with primary care periodically for pre-diabetes. - LDL is not at goal. We like this to be less than 70 with her history of CAD. Please d/c simvastatin and start atorvastatin 40mg  QPM. F/u LFTS/lipids in 6   weeks. - CBC, TSH OK.  Patient verbalized understanding and will have lab work done on December 14th.

## 2015-07-06 ENCOUNTER — Ambulatory Visit (HOSPITAL_COMMUNITY)
Admission: RE | Admit: 2015-07-06 | Discharge: 2015-07-06 | Disposition: A | Discharge status: Home / Self Care | Payer: Self-pay | Source: Ambulatory Visit | Attending: Cardiology | Admitting: Cardiology

## 2015-07-06 DIAGNOSIS — I10 Essential (primary) hypertension: Secondary | ICD-10-CM | POA: Insufficient documentation

## 2015-07-06 DIAGNOSIS — R0989 Other specified symptoms and signs involving the circulatory and respiratory systems: Secondary | ICD-10-CM | POA: Insufficient documentation

## 2015-07-06 DIAGNOSIS — E785 Hyperlipidemia, unspecified: Secondary | ICD-10-CM | POA: Insufficient documentation

## 2015-07-06 DIAGNOSIS — I6523 Occlusion and stenosis of bilateral carotid arteries: Secondary | ICD-10-CM | POA: Insufficient documentation

## 2015-07-06 HISTORY — DX: Disorder of arteries and arterioles, unspecified: I77.9

## 2015-07-06 HISTORY — DX: Peripheral vascular disease, unspecified: I73.9

## 2015-07-07 ENCOUNTER — Encounter (HOSPITAL_COMMUNITY): Payer: Self-pay

## 2015-07-07 DIAGNOSIS — I739 Peripheral vascular disease, unspecified: Secondary | ICD-10-CM

## 2015-07-07 DIAGNOSIS — I779 Disorder of arteries and arterioles, unspecified: Secondary | ICD-10-CM | POA: Insufficient documentation

## 2015-07-08 ENCOUNTER — Other Ambulatory Visit: Payer: Self-pay | Admitting: Internal Medicine

## 2015-08-05 ENCOUNTER — Other Ambulatory Visit (INDEPENDENT_AMBULATORY_CARE_PROVIDER_SITE_OTHER): Payer: Self-pay | Admitting: *Deleted

## 2015-08-05 DIAGNOSIS — Z79899 Other long term (current) drug therapy: Secondary | ICD-10-CM

## 2015-08-05 LAB — HEPATIC FUNCTION PANEL
ALT: 19 U/L (ref 6–29)
AST: 19 U/L (ref 10–35)
Albumin: 4.1 g/dL (ref 3.6–5.1)
Alkaline Phosphatase: 95 U/L (ref 33–130)
BILIRUBIN DIRECT: 0.2 mg/dL (ref <=0.2)
BILIRUBIN INDIRECT: 0.7 mg/dL (ref 0.2–1.2)
BILIRUBIN TOTAL: 0.9 mg/dL (ref 0.2–1.2)
Total Protein: 7.1 g/dL (ref 6.1–8.1)

## 2015-08-05 LAB — LIPID PANEL
CHOL/HDL RATIO: 3.7 ratio (ref <=5.0)
CHOLESTEROL: 146 mg/dL (ref 125–200)
HDL: 39 mg/dL — ABNORMAL LOW (ref >=46)
LDL CALC: 67 mg/dL (ref <130)
Triglycerides: 201 mg/dL — ABNORMAL HIGH (ref <150)
VLDL: 40 mg/dL — AB (ref <30)

## 2015-08-06 ENCOUNTER — Other Ambulatory Visit: Payer: Self-pay | Admitting: Internal Medicine

## 2016-03-31 NOTE — Progress Notes (Signed)
Cardiology Office Note:    Date:  04/01/2016   ID:  Ashley [name], DOB [birth year], MRN [id number]  PCP:  Dorris Carnes, MD  Cardiologist:  Dr. Dorris Carnes   Electrophysiologist:  n/a  Referring MD: [name], MD   Chief Complaint  Patient presents with  . Hospitalization Follow-up    s/p CVA    History of Present Illness:    Ashley [name] is a 50 y.o. 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  LHC in 05/2011 demonstrated three-vessel CAD.  TEE demonstrated severe mitral regurgitation.  She was eventually brought in for bypass surgery and mitral valve repair.  This was performed by Dr. [name] on 10/21/11: LIMA-LAD, SVG-PDA, SVG-OM, SVG-diagonal.  A Sorin Memo 3D (36mm) ring annuloplasty was used for her mitral valve repair.  Last seen by Melina Copa, PA-C in 11/16.    She returns today for post hospitalization FU.  She was admitted to [facility] in [place], [name] 7/15-7/18 with an acute L parietal CVA treated with tPA.  DC summary received and reviewed.  It appears that she had an unremarkable Head CT.  Brain MRI demonstrated acute infarct within the L parietal white matter.  Echo demonstrated EF [id number], diastolic dysfunction.  Carotid US was without significant ICA stenosis.  She was DC on Atorvastatin 80, Carvedilol 12.5 bid, Lisinopril 10 QD, Plavix 75 QD.  Since DC, she has been doing well.  She still has some speech issues, but it is improving.  She does not feel that she needs speech therapy.  She denies chest pain, dyspnea, syncope, orthopnea, PND, edema.  She notes increasing bruising due to ASA+Plavix.    Prior CV studies that were reviewed today include:    Carotid 11/16 1-39% bilateral ICA stenosis. - FU prn  Echo 4/13 Mild LVH, EF 55-60%, no RWMA, MV  repair ok with no MS/MR, mod LAE, mild RVE, mild RAE, PASP 34 mmHg   Past Medical History:  Diagnosis Date  . Abdominal pain    ? chronic cholecystitis; admxn to [facility] 9/12 (CT, USN, HIDA done)  . Cardiomyopathy    echo 9/12: mild LVH, EF 35-40%, mod MR (difficult to judge /post leaflet restricted), mod BAE, mod RVE, mod TR   . Cardiomyopathy ([place])    a. echo 9/12: mild LVH, EF 35-40%, mod MR (difficult to judge /post leaflet restricted), mod BAE, mod RVE, mod TR. b. F/u echo 11/2011: mild LVH, EF 55-60%, s/p MV repair without significant MR/MS, mildly dilated RV/RA, PASP 58mmHg.   . Carotid artery disease ([place])    a. Duplex 06/2015: 50% RECA, 1-39% BICA.  [name] Coronary artery disease    a. s/p CABG 10/2011 - LIMA-LAD, SVG-PDA, SVG-OM, SVG-diagonal.  . Dyslipidemia   . Essential hypertension   . Mitral regurgitation    a. s/p MV repair 10/2011 - on Coumadin for 3 months afterwards then d/c'd by surgery.  . Recurrent upper respiratory infection (URI)   . 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 Stroke [facility])    a. age 39 - in setting of cigs and OCPs.  . Tobacco abuse     Past Surgical History:  Procedure Laterality Date  . CARDIAC CATHETERIZATION    .  CESAREAN SECTION    . cesarean section    . CORONARY ARTERY BYPASS GRAFT  10/21/2011   Procedure: CORONARY ARTERY BYPASS GRAFTING (CABG);  Surgeon: [name], MD;  Location: [place];  Service: Open Heart Surgery;  Laterality: N/A;  cabg x four, using right leg greater saphenous vein harvested endoscopically  . DENTAL SURGERY    . MITRAL VALVE REPAIR  10/21/2011   Procedure: MITRAL VALVE REPAIR (MVR);  Surgeon: [name], MD;  Location: [place];  Service: Open Heart Surgery;  Laterality: N/A;  . TEE WITHOUT CARDIOVERSION  06/30/2011   Procedure: TRANSESOPHAGEAL ECHOCARDIOGRAM (TEE);  Surgeon: [name], MD;  Location: [facility] ENDOSCOPY;  Service: Cardiovascular;  Laterality: N/A;  . TONSILLECTOMY    . TUBAL LIGATION        Current Medications: Outpatient Medications Prior to Visit  Medication Sig Dispense Refill  . aspirin EC 81 MG tablet Take 1 tablet (81 mg total) by mouth daily.    [name] atorvastatin (LIPITOR) 40 MG tablet Take 1 tablet (40 mg total) by mouth daily at 6 PM. 90 tablet 3  . carvedilol (COREG) 12.5 MG tablet take 1 and 1/2 tablet by mouth twice a day (MAY TAKE 1 EXTRA AS NEEDED) 95 tablet 11  . famotidine (PEPCID) 20 MG tablet Take 20 mg by mouth daily as needed. For indigestion    . lisinopril (PRINIVIL,ZESTRIL) 10 MG tablet take 1 tablet by mouth once daily 30 tablet 10  . amLODipine (NORVASC) 5 MG tablet take 1 tablet by mouth once daily (Patient not taking: Reported on 04/01/2016) 30 tablet 10   No facility-administered medications prior to visit.       Allergies:   Azithromycin   Social History   Social History  . Marital status: Married    Spouse name: N/A  . Number of children: N/A  . Years of education: N/A   Social History Main Topics  . Smoking status: Former Smoker    Packs/day: 1.00    Years: 30.00    Quit date: 05/07/2011  . Smokeless tobacco: Former Systems developer    Quit date: 06/06/2011  . Alcohol use 0.0 oz/week     Comment: 1 glass of red wine nightly  . Drug use: No  . Sexual activity: Yes   Other Topics Concern  . None   Social History Narrative  . None     Family History:  The patient's family history includes COPD in her father; Heart disease in her father; Hypertension in her mother.   ROS:   Please see the history of present illness.    Review of Systems  Hematologic/Lymphatic: Bruises/bleeds easily.   All other systems reviewed and are negative.   EKGs/Labs/Other Test Reviewed:    EKG:  EKG is  ordered today.  The ekg ordered today demonstrates NSR, HR 64, normal axis, septal Q waves, QTc 425 ms, NSSTTW changes, no change from prior tracing  Recent Labs: 06/23/2015: BUN 8; Creat 0.90; Hemoglobin 12.3; Platelets 255; Potassium 5.1; Sodium 136; TSH  1.904 08/05/2015: ALT 19   Recent Lipid Panel    Component Value Date/Time   CHOL 146 08/05/2015 1119   TRIG 201 (H) 08/05/2015 1119   HDL 39 (L) 08/05/2015 1119   CHOLHDL 3.7 08/05/2015 1119   VLDL 40 (H) 08/05/2015 1119   LDLCALC 67 08/05/2015 1119     Physical Exam:    VS:  BP (!) 150/82   Pulse 64   Ht 5' 8.5" (1.74 m)  Wt 208 lb 12.8 oz (94.7 kg)   BMI 31.29 kg/m     Wt Readings from Last 3 Encounters:  04/01/16 208 lb 12.8 oz (94.7 kg)  06/23/15 209 lb 1.9 oz (94.9 kg)  02/20/14 203 lb 6.4 oz (92.3 kg)     Physical Exam  Constitutional: She is oriented to person, place, and time. She appears well-developed and well-nourished. No distress.  HENT:  Head: Normocephalic and atraumatic.  Eyes: No scleral icterus.  Neck: Normal range of motion. No JVD present.  Cardiovascular: Normal rate, regular rhythm, S1 normal and S2 normal.  Exam reveals no gallop and no friction rub.   No murmur heard. Pulmonary/Chest: Breath sounds normal. She has no wheezes. She has no rhonchi. She has no rales.  Abdominal: Soft. There is no tenderness.  Musculoskeletal: She exhibits no edema.  Neurological: She is alert and oriented to person, place, and time.  Skin: Skin is warm and dry.  Psychiatric: She has a normal mood and affect.    ASSESSMENT:    1. History of CVA (cerebrovascular accident)   2. CAD in native artery   3. H/O mitral valve repair   4. Essential hypertension   5. Dyslipidemia    PLAN:    In order of problems listed above:  1. S/p Recent CVA - She received tPA and has minimal residual speech deficit.  She is now on ASA and Plavix.  I did a review on Up To Date.  It appears that current recommendations would be to remain on DAP therapy for 90 days.  I will refer her to Neuro for FU and ask that they make further recommendations regarding ASA and Plavix (ie remain on Plavix only after 90 days or ASA only after 90 days).  She has a hx of MV disease s/p repair and  also has a prior echo with mod LAE.  She is at risk for AFib.  Will get an Event Monitor to screen for AFib.   2.  CAD - No angina.  Continue ASA, statin, beta-blocker.  3. S/p MV Repair - Will request most recent Echo report done at [facility] in [place], [name].  Continue SBE prophylaxis.    4. HTN - BP uncontrolled.  Increase Lisinopril to 20 mg QD.  BMET 1 week.  5. HL - Atorva increased to 80 mg during her hospitalization.  Continue statin.  Check L/L in 2-3 mos.    Medication Adjustments/Labs and Tests Ordered: Current medicines are reviewed at length with the patient today.  Concerns regarding medicines are outlined above.  Medication changes, Labs and Tests ordered today are outlined in the Patient Instructions noted below. Patient Instructions  Medication Instructions:  INCREASE Lisinopril to 20 mg Once daily  Labwork: In 1 week - BMET In 2-3 months - Fasting Lipids, LFTs Testing/Procedures: Schedule an Event Monitor to rule out Atrial Fibrillation (Dx - Recent CVA) Follow-Up: Dr. Dorris Carnes in 3 months.  Any Other Special Instructions Will Be Listed Below (If Applicable). 1. Please try to establish with primary care at the [facility]. 2. I will refer you to [facility] for follow up on your stroke.  Please ask them about Aspirin and Plavix. If you need a refill on your cardiac medications before your next appointment, please call your pharmacy.   Signed, Richardson Dopp, PA-C  04/01/2016 1:23 PM    [facility] [place], [place], [place]  [address] Phone: [phone]; Fax: [phone]

## 2016-04-01 ENCOUNTER — Encounter: Payer: Self-pay | Admitting: Physician Assistant

## 2016-04-01 ENCOUNTER — Ambulatory Visit (INDEPENDENT_AMBULATORY_CARE_PROVIDER_SITE_OTHER): Payer: Self-pay | Admitting: Physician Assistant

## 2016-04-01 VITALS — BP 150/82 | HR 64 | Ht 68.5 in | Wt 208.8 lb

## 2016-04-01 DIAGNOSIS — Z9889 Other specified postprocedural states: Secondary | ICD-10-CM

## 2016-04-01 DIAGNOSIS — I2510 Atherosclerotic heart disease of native coronary artery without angina pectoris: Secondary | ICD-10-CM

## 2016-04-01 DIAGNOSIS — I10 Essential (primary) hypertension: Secondary | ICD-10-CM

## 2016-04-01 DIAGNOSIS — E785 Hyperlipidemia, unspecified: Secondary | ICD-10-CM

## 2016-04-01 DIAGNOSIS — Z8673 Personal history of transient ischemic attack (TIA), and cerebral infarction without residual deficits: Secondary | ICD-10-CM

## 2016-04-01 MED ORDER — LISINOPRIL 20 MG PO TABS: 20.0000 mg | ORAL_TABLET | Freq: Every day | ORAL | 3 refills | Status: DC

## 2016-04-01 NOTE — Patient Instructions (Addendum)
Medication Instructions:  INCREASE Lisinopril to 20 mg Once daily  Labwork: In 1 week - BMET In 2-3 months - Fasting Lipids, LFTs Testing/Procedures: Schedule an Event Monitor to rule out Atrial Fibrillation (Dx - Recent CVA) Follow-Up: Dr. Dorris Carnes in 3 months.  Any Other Special Instructions Will Be Listed Below (If Applicable). 1. Please try to establish with primary care at the [facility]. 2. I will refer you to [facility] for follow up on your stroke.  Please ask them about Aspirin and Plavix. If you need a refill on your cardiac medications before your next appointment, please call your pharmacy.

## 2016-04-05 ENCOUNTER — Other Ambulatory Visit: Payer: Self-pay

## 2016-04-05 MED ORDER — CLOPIDOGREL BISULFATE 75 MG PO TABS: 75.0000 mg | ORAL_TABLET | Freq: Every day | ORAL | 11 refills | Status: DC

## 2016-04-06 ENCOUNTER — Telehealth: Payer: Self-pay | Admitting: Internal Medicine

## 2016-04-06 NOTE — Telephone Encounter (Signed)
Informed patient that records are not scanned in and are not in Dr. Harrington Challenger' or [facility] box. She states she has a Neurology OV next week and wants to make sure her MRI and CT scan are available for them to read. Informed her a message would be sent to Medical Records to see what they could do to expedite receiving records. She was grateful for call.

## 2016-04-06 NOTE — Telephone Encounter (Signed)
New message      Pt requested nurse call concerning receipt of paperwork from [facility], [name]. Please call.

## 2016-04-07 ENCOUNTER — Encounter: Payer: Self-pay | Admitting: Internal Medicine

## 2016-04-07 ENCOUNTER — Telehealth: Payer: Self-pay | Admitting: Internal Medicine

## 2016-04-07 NOTE — Telephone Encounter (Signed)
Spoke with Medical Records Secretary at [facility] she will fax records over. Patient signed ROI and they have it on file there.

## 2016-04-08 ENCOUNTER — Other Ambulatory Visit: Payer: Self-pay | Admitting: *Deleted

## 2016-04-08 DIAGNOSIS — I10 Essential (primary) hypertension: Secondary | ICD-10-CM

## 2016-04-08 LAB — BASIC METABOLIC PANEL
BUN: 10 mg/dL (ref 7–25)
CO2: 23 mmol/L (ref 20–31)
Calcium: 9.1 mg/dL (ref 8.6–10.4)
Chloride: 108 mmol/L (ref 98–110)
Creat: 0.82 mg/dL (ref 0.50–1.05)
Glucose, Bld: 137 mg/dL — ABNORMAL HIGH (ref 65–99)
Potassium: 4.4 mmol/L (ref 3.5–5.3)
Sodium: 141 mmol/L (ref 135–146)

## 2016-04-11 ENCOUNTER — Telehealth: Payer: Self-pay | Admitting: *Deleted

## 2016-04-11 NOTE — Telephone Encounter (Signed)
Pt notified of lab results by phone with verbal understanding.  

## 2016-04-12 ENCOUNTER — Telehealth: Payer: Self-pay | Admitting: Internal Medicine

## 2016-04-12 ENCOUNTER — Encounter: Payer: Self-pay | Admitting: Neurology

## 2016-04-12 ENCOUNTER — Ambulatory Visit (INDEPENDENT_AMBULATORY_CARE_PROVIDER_SITE_OTHER): Payer: Self-pay | Admitting: Neurology

## 2016-04-12 VITALS — BP 175/78 | Ht 68.5 in | Wt 208.6 lb

## 2016-04-12 DIAGNOSIS — I10 Essential (primary) hypertension: Secondary | ICD-10-CM

## 2016-04-12 DIAGNOSIS — G518 Other disorders of facial nerve: Secondary | ICD-10-CM

## 2016-04-12 DIAGNOSIS — Z9889 Other specified postprocedural states: Secondary | ICD-10-CM

## 2016-04-12 DIAGNOSIS — Z720 Tobacco use: Secondary | ICD-10-CM

## 2016-04-12 DIAGNOSIS — I63412 Cerebral infarction due to embolism of left middle cerebral artery: Secondary | ICD-10-CM

## 2016-04-12 DIAGNOSIS — E785 Hyperlipidemia, unspecified: Secondary | ICD-10-CM

## 2016-04-12 DIAGNOSIS — I429 Cardiomyopathy, unspecified: Secondary | ICD-10-CM

## 2016-04-12 DIAGNOSIS — Z951 Presence of aortocoronary bypass graft: Secondary | ICD-10-CM

## 2016-04-12 DIAGNOSIS — G5139 Clonic hemifacial spasm, unspecified: Secondary | ICD-10-CM

## 2016-04-12 NOTE — Patient Instructions (Signed)
-   continue ASA and plavix for another 2 months and then plavix alone - continue lipitor for stroke prevention - Follow up with your primary care physician for stroke risk factor modification. Recommend maintain blood pressure goal <130/80, diabetes with hemoglobin A1c goal below 7.0% and lipids with LDL cholesterol goal below 70 mg/dL.  - continue follow up with cardiology - will do blood draw to rule out hypercoagulable state - will do TCD emboli detection - will do MRI and MRA to look at head and brain vessels. - will refer to my colleague for botox injection for right hemifacial spasm - check BP at home and record - continue abstain from smoking - healthy diet and regular exercise. - follow up in 2 months.

## 2016-04-12 NOTE — Progress Notes (Signed)
NEUROLOGY CLINIC NEW PATIENT NOTE  NAME: Ashley [name] DOB: 11-29-1965 REFERRING PHYSICIAN: [name], MD  I saw [facility] as a new consult in the neurovascular clinic today regarding  Chief Complaint  Patient presents with  . Referral    Referral from Dr. Kathlen Mody cardiologist for Stroke. Pt saw [name] in the past for a Stroke. Pt had stroke in July 2017. Pt was seen in [facility] at [facility].  .  HPI: Ashley [name] is a 50 y.o. 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   Pt had a stroke when she was 50 yo in the setting of OCP and smoking. She quit OCP since then. At that time, she woke up in the morning, could not talk at all with left arm weakness without leg weakness. She had PT/OT for 6 months with residual of slurry speech and left hand dexterity difficulty when she got tired or sick. She had followed with [name] at [facility] in the past.  She was admitted to [facility] in [place], [name] 03/05/16-03/08/16 for a stroke and treated with tPA. At the time, she was shopping in the store, felt some right arm tingling and then her daughter told her she has some facial droop. After sitting down, she was not able to talk for about 10 minutes. Symptoms gradually getting better in ER. Brain MRI demonstrated acute infarct within the L parietal white matter.  Echo EF 50% and CUS no significant ICA stenosis.  She was DC on Atorvastatin 80, Carvedilol 12.5 bid, Lisinopril 10 QD, Plavix 75 QD.  Since DC, she has been doing well.  She still has some speech difficulty which she thinks is chronic from previous stroke. She notes increasing bruising due to ASA+Plavix. Followed with cardiology and 30 day cardiac event monitoring ordered due to risk of afib.   She also has frequent right facial  twitching and eye blinking since the heart surgery 4 years ago. It happens every day, worse with stress and fatigue. 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 LHC in 05/2011 demonstrated three-vessel CAD. TEE demonstrated severe mitral regurgitation, no PFO. She had CABG and MV repair by Dr. [name] on 10/21/11.   History of HTN, on Coreg and lisinopril. At home blood pressure 140-145, today BP 175/78. Patient complains of white coat syndrome.  History of HLD, on Lipitor 40 at home. LDL 06/2015 was 91 and 07/2015 was 67.  She was smoker and quit smoking last month after the second stroke. Social alcohol, denies any illicit drug use.    Past Medical History:  Diagnosis Date  . Abdominal pain    ? chronic cholecystitis; admxn to [facility] 9/12 (CT, USN, HIDA done)  . Cardiomyopathy    echo 9/12: mild LVH, EF 35-40%, mod MR (difficult to judge /post leaflet restricted), mod BAE, mod RVE, mod TR   . Cardiomyopathy ([place])    a. echo 9/12: mild LVH, EF 35-40%, mod MR (difficult to judge /post leaflet restricted), mod BAE, mod RVE, mod TR. b. F/u echo 11/2011: mild LVH, EF 55-60%, s/p MV repair without significant MR/MS, mildly dilated RV/RA, PASP 63mmHg.   . Carotid artery disease (  [place])    a. Duplex 06/2015: 50% RECA, 1-39% BICA.  [name] Coronary artery disease    a. s/p CABG 10/2011 - LIMA-LAD, SVG-PDA, SVG-OM, SVG-diagonal.  . Dyslipidemia   . Essential hypertension   . Mitral regurgitation    a. s/p MV repair 10/2011 - on Coumadin for 3 months afterwards then d/c'd by surgery.  . Recurrent upper respiratory infection (URI)   . 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 Stroke [facility])    a. age 47 - in setting of cigs and OCPs.  . Tobacco abuse    Past Surgical History:  Procedure Laterality Date  . CARDIAC CATHETERIZATION    . CESAREAN SECTION    . cesarean section    . CORONARY  ARTERY BYPASS GRAFT  10/21/2011   Procedure: CORONARY ARTERY BYPASS GRAFTING (CABG);  Surgeon: [name], MD;  Location: [place];  Service: Open Heart Surgery;  Laterality: N/A;  cabg x four, using right leg greater saphenous vein harvested endoscopically  . DENTAL SURGERY    . MITRAL VALVE REPAIR  10/21/2011   Procedure: MITRAL VALVE REPAIR (MVR);  Surgeon: [name], MD;  Location: [place];  Service: Open Heart Surgery;  Laterality: N/A;  . TEE WITHOUT CARDIOVERSION  06/30/2011   Procedure: TRANSESOPHAGEAL ECHOCARDIOGRAM (TEE);  Surgeon: [name], MD;  Location: [facility] ENDOSCOPY;  Service: Cardiovascular;  Laterality: N/A;  . TONSILLECTOMY    . TUBAL LIGATION     Family History  Problem Relation Age of Onset  . Hypertension Mother   . Stroke Mother   . Heart disease Father   . COPD Father   . Anesthesia problems Neg Hx   . Hypotension Neg Hx   . Malignant hyperthermia Neg Hx   . Pseudochol deficiency Neg Hx    Current Outpatient Prescriptions  Medication Sig Dispense Refill  . aspirin EC 81 MG tablet Take 1 tablet (81 mg total) by mouth daily.    [name] atorvastatin (LIPITOR) 40 MG tablet Take 1 tablet (40 mg total) by mouth daily at 6 PM. 90 tablet 3  . carvedilol (COREG) 12.5 MG tablet take 1 and 1/2 tablet by mouth twice a day (MAY TAKE 1 EXTRA AS NEEDED) 95 tablet 11  . clopidogrel (PLAVIX) 75 MG tablet Take 1 tablet (75 mg total) by mouth daily. 60 tablet 11  . famotidine (PEPCID) 20 MG tablet Take 20 mg by mouth daily as needed. For indigestion    . lisinopril (PRINIVIL,ZESTRIL) 20 MG tablet Take 1 tablet (20 mg total) by mouth daily. 90 tablet 3  . vitamin C (ASCORBIC ACID) 500 MG tablet Take 500 mg by mouth 2 (two) times daily.     No current facility-administered medications for this visit.    Allergies  Allergen Reactions  . Azithromycin Nausea Only   Social History   Social History  . Marital status: Married    Spouse name: N/A  . Number of children: N/A  . Years  of education: N/A   Occupational History  . Not on file.   Social History Main Topics  . Smoking status: Former Smoker    Packs/day: 1.00    Years: 30.00    Quit date: 05/07/2011  . Smokeless tobacco: Never Used  . Alcohol use 0.0 oz/week     Comment: 1 glass of red wine nightly  . Drug use: No  . Sexual activity: Yes   Other Topics Concern  . Not on file  Social History Narrative  . No narrative on file    Review of Systems Full 14 system review of systems performed and notable only for those listed, all others are neg:  Constitutional:   Cardiovascular:  Ear/Nose/Throat:   Skin:  Eyes:   Respiratory:   Gastroitestinal:   Genitourinary:  Hematology/Lymphatic:  Easy bleeding Endocrine:  Musculoskeletal:   Allergy/Immunology:  Allergies Neurological:   Psychiatric:  Sleep:    Physical Exam  Vitals:   04/12/16 1051  BP: (!) 175/78    General - Well nourished, well developed, in no apparent distress,.  Ophthalmologic - Fundi not visualized due to hemifacial spasm on the right and eye movement on the left.  Cardiovascular - Regular rate and rhythm. Carotid pulses were 2+ without bruits .   Neck - supple, no nuchal rigidity .  Mental Status -  Level of arousal and orientation to time, place, and person were intact. Language including expression, naming, repetition, comprehension, reading, and writing was assessed and found intact. Occasional stuttering due to anxiety and stress Attention span and concentration were normal. Recent and remote memory were intact. Fund of Knowledge was assessed and was intact.  Cranial Nerves II - XII - II - Visual field intact OU. III, IV, VI - Extraocular movements intact. V - Facial sensation intact bilaterally. VII - Facial movement intact bilaterally, frequent episodes of right facial twitching and eye blinking consistent with hemifacial spasm. VIII - Hearing & vestibular intact bilaterally. X - Palate elevates  symmetrically. XI - Chin turning & shoulder shrug intact bilaterally. XII - Tongue protrusion intact.  Motor Strength - The patient's strength was normal in all extremities except left hand mild dexterity difficulties and pronator drift was absent.  Bulk was normal and fasciculations were absent.   Motor Tone - Muscle tone was assessed at the neck and appendages and was normal.  Reflexes - The patient's reflexes were normal in all extremities and she had no pathological reflexes.  Sensory - Light touch, temperature/pinprick were assessed and were normal.    Coordination - The patient had normal movements in the hands and feet with no ataxia or dysmetria.  Tremor was absent.  Gait and Station - The patient's transfers, posture, gait, station, and turns were observed as normal.   Imaging  Brain imaging not available for review. However, as per note:  CT head x 2 - no acute abnormalities.  MRI brain - acute infarct within the L parietal white matter.    TTE - EF 50%   CUS - no significant ICA stenosis.    Lab Review  Component     Latest Ref Rng & Units 06/23/2015 08/05/2015  Total Bilirubin     0.2 - 1.2 mg/dL  0.9  Bilirubin, Direct     <=0.2 mg/dL  0.2  Indirect Bilirubin     0.2 - 1.2 mg/dL  0.7  Alkaline Phosphatase     33 - 130 U/L  95  AST     10 - 35 U/L  19  ALT     6 - 29 U/L  19  Total Protein     6.1 - 8.1 g/dL  7.1  Albumin     3.6 - 5.1 g/dL  4.1  Cholesterol     125 - 200 mg/dL 157 146  Triglycerides     <150 mg/dL 107 201 (H)  HDL Cholesterol     >=46 mg/dL 45 (L) 39 (L)  Total CHOL/HDL Ratio     <=  5.0 Ratio 3.5 3.7  VLDL     <30 mg/dL 21 40 (H)  LDL (calc)     <130 mg/dL 91 67  Hemoglobin A1C     <5.7 % 5.4   Mean Plasma Glucose     <117 mg/dL 108   TSH     0.350 - 4.500 uIU/mL 1.904       Assessment and Plan:   In summary, Shayanna Vetere is a 50 y.o. female with PMH of CAD s/p CABG, MVP with severe MR s/p MV repair, dilated CM with  prior EF 35-40% but improved to normal, prior stroke at age of 62 presumed due to smoking and OCP use, HTN, HLD, and tobacco abuse who presents as a new patient for a stroke at left parietal WM on 03/05/16. Echo EF 50% and CUS no significant ICA stenosis.  She was DC on Atorvastatin 80, Carvedilol 12.5 bid, Lisinopril 10 QD, Plavix 75 QD.  Since DC, she has been doing well.  She still has some speech difficulty which she thinks is chronic from previous stroke. She is on ASA+Plavix, will continue for total 3 months and then plavix alone. Followed with cardiology and 30 day cardiac event monitoring ordered due to risk of afib. For further stroke work up, she had TEE in 2012 showed no PFO, will do MRI and MRA with TCD emboli detection as well as hypercoagulable and autoimmune work up.   - continue ASA and plavix for another 2 months and then plavix alone - continue lipitor for stroke prevention - Follow up with your primary care physician for stroke risk factor modification. Recommend maintain blood pressure goal <130/80, diabetes with hemoglobin A1c goal below 7.0% and lipids with LDL cholesterol goal below 70 mg/dL.  - continue follow up with cardiology - will do blood draw to rule out hypercoagulable state - will do TCD emboli detection - will do MRI and MRA to look at head and brain vessels. - will refer to my colleague for botox injection for right hemifacial spasm - check BP at home and record - continue abstain from smoking - healthy diet and regular exercise. - follow up in 2 months.   I recommend aggressive blood pressure control with a goal <130/80 mm Hg.  Lipids should be managed intensively, with a goal LDL < 70 mg/dL.  I encouraged the patient to discuss these important issues with her primary care physician.  I counseled the patient on measures to reduce stroke risk, including the importance of medication compliance, risk factor control, exercise, healthy diet, and avoidance of smoking.  I  reviewed stroke warning signs and symptoms and appropriate actions to take if such occurs.   Thank you very much for the opportunity to participate in the care of this patient.  Please do not hesitate to call if any questions or concerns arise.   A total of 55 minutes was spent face-to-face with this patient as pt has two new different diagnosis need attention and work up. Over half this time was spent on counseling patient on the stroke and hemifacial spasm diagnosis and different diagnostic and therapeutic options available.    Orders Placed This Encounter  Procedures  . [place] TCD [facility]    Standing Status:   Future    Standing Expiration Date:   10/14/2016    Order Specific Question:   Reason for Exam (SYMPTOM  OR DIAGNOSIS REQUIRED)    Answer:   hemifacial spasm and recent stroke    Order Specific  Question:   Preferred imaging location?    Answer:   Internal  . MR Brain W Wo Contrast    Standing Status:   Future    Standing Expiration Date:   06/13/2017    Order Specific Question:   If indicated for the ordered procedure, I authorize the administration of contrast media per Radiology protocol    Answer:   Yes    Order Specific Question:   Reason for Exam (SYMPTOM  OR DIAGNOSIS REQUIRED)    Answer:   hemifacial spasm and recent stroke    Order Specific Question:   Preferred imaging location?    Answer:   Internal    Order Specific Question:   What is the patient's sedation requirement?    Answer:   No Sedation    Order Specific Question:   Does the patient have a pacemaker or implanted devices?    Answer:   No  . MR MRA HEAD WO CONTRAST    Standing Status:   Future    Standing Expiration Date:   06/15/2017    Order Specific Question:   Reason for Exam (SYMPTOM  OR DIAGNOSIS REQUIRED)    Answer:   hemifacial spasm and recent stroke    Order Specific Question:   Preferred imaging location?    Answer:   Internal    Order Specific Question:   Does the patient have a pacemaker  or implanted devices?    Answer:   No    Order Specific Question:   What is the patient's sedation requirement?    Answer:   No Sedation  . Beta-2 Glycoprotein I Ab,G/M  . Cardiolipin antibodies, IgM+IgG  . Lupus Anticoagulant Panel  . Homocysteine  . Systemic Lupus Profile A  . Hemoglobin A1c  . RPR  . Vitamin B12  . TSH + free T4  . HIV antibody (with reflex)    No orders of the defined types were placed in this encounter.   Patient Instructions  - continue ASA and plavix for another 2 months and then plavix alone - continue lipitor for stroke prevention - Follow up with your primary care physician for stroke risk factor modification. Recommend maintain blood pressure goal <130/80, diabetes with hemoglobin A1c goal below 7.0% and lipids with LDL cholesterol goal below 70 mg/dL.  - continue follow up with cardiology - will do blood draw to rule out hypercoagulable state - will do TCD emboli detection - will do MRI and MRA to look at head and brain vessels. - will refer to my colleague for botox injection for right hemifacial spasm - check BP at home and record - continue abstain from smoking - healthy diet and regular exercise. - follow up in 2 months.    Rosalin Hawking, MD PhD [facility] [address] [place], [place] [address] [phone]

## 2016-04-12 NOTE — Telephone Encounter (Signed)
error 

## 2016-04-13 ENCOUNTER — Telehealth: Payer: Self-pay | Admitting: Neurology

## 2016-04-13 ENCOUNTER — Telehealth: Payer: Self-pay | Admitting: *Deleted

## 2016-04-13 DIAGNOSIS — G5139 Clonic hemifacial spasm, unspecified: Secondary | ICD-10-CM | POA: Insufficient documentation

## 2016-04-13 NOTE — Telephone Encounter (Signed)
-----   Message from Rosalin Hawking, MD sent at 04/13/2016  9:45 AM EDT ----- Hi, Dr. Krista Blue:  I have a pt has right hemifacial spasm. I have ordered MRI and MRA. I am going to refer to you for botox injection. I will ask Bebe Shaggy to make an appointment with you. Hope it is OK with you. Thanks.  Jindong

## 2016-04-13 NOTE — Telephone Encounter (Signed)
Left message for a return call.  Per Dr. Phoebe Sharps request, need to schedule an appt w/ Dr. Krista Blue to discuss Botox injections for her hemifacial spasms.

## 2016-04-13 NOTE — Telephone Encounter (Signed)
[facility] Patient is ready to be scheduled for Doppler no PA needed. Thanks Hinton Dyer

## 2016-04-13 NOTE — Telephone Encounter (Signed)
Spoke to patient and scheduled appointment.

## 2016-04-14 ENCOUNTER — Encounter: Payer: Self-pay | Admitting: Physician Assistant

## 2016-04-18 LAB — TSH+FREE T4
Free T4: 1.06 ng/dL (ref 0.82–1.77)
TSH: 1.55 u[IU]/mL (ref 0.450–4.500)

## 2016-04-18 LAB — SYSTEMIC LUPUS PROFILE A
Chromatin Ab SerPl-aCnc: <0.2 AI (ref 0.0–0.9)
ENA RNP AB: 0.4 AI (ref 0.0–0.9)
ENA SSA (RO) Ab: <0.2 AI (ref 0.0–0.9)
dsDNA Ab: 3 IU/mL (ref 0–9)

## 2016-04-18 LAB — HEMOGLOBIN A1C
Est. average glucose Bld gHb Est-mCnc: 111 mg/dL
Hgb A1c MFr Bld: 5.5 % (ref 4.8–5.6)

## 2016-04-18 LAB — LUPUS ANTICOAGULANT PANEL
APTT: 29.1 s
Anticardiolipin Ab, IgG: 16 [GPL'U]
BETA-2 GLYCOPROTEIN I, IGA: 14 SAU
BETA-2 GLYCOPROTEIN I, IGG: 93 SGU — AB
Beta-2 Glycoprotein I, IgM: <10 SMU
DRVVT CONFIRM SECONDS: 34.7 s
DRVVT RATIO: 1.3 ratio — AB
DRVVT Screen Seconds: 47.2 s — ABNORMAL HIGH
HEXAGONAL PHOSPHOLIPID NEUTRAL: 9 s
INR: 1.1 ratio
Platelet Neutralization: 0 s
Prothrombin Time: 11 s
Thrombin Time: 17.2 s

## 2016-04-18 LAB — RPR: RPR Ser Ql: NONREACTIVE

## 2016-04-18 LAB — CARDIOLIPIN ANTIBODIES, IGM+IGG
ANTICARDIOLIPIN IGG: 13 GPL U/mL (ref 0–14)
Anticardiolipin IgM: <9 MPL U/mL (ref 0–12)

## 2016-04-18 LAB — HIV ANTIBODY (ROUTINE TESTING W REFLEX): HIV SCREEN 4TH GENERATION: NONREACTIVE

## 2016-04-18 LAB — BETA-2 GLYCOPROTEIN I AB,G/M: BETA 2 GLYCO I IGG: 74 GPI IgG units — AB (ref 0–20)

## 2016-04-18 LAB — HOMOCYSTEINE: HOMOCYSTEINE: 20.5 umol/L — AB (ref 0.0–15.0)

## 2016-04-18 LAB — VITAMIN B12: Vitamin B-12: 184 pg/mL — ABNORMAL LOW (ref 211–946)

## 2016-04-20 ENCOUNTER — Ambulatory Visit (INDEPENDENT_AMBULATORY_CARE_PROVIDER_SITE_OTHER): Payer: Self-pay

## 2016-04-20 DIAGNOSIS — I63412 Cerebral infarction due to embolism of left middle cerebral artery: Secondary | ICD-10-CM

## 2016-04-21 NOTE — Telephone Encounter (Signed)
I reviewed hospital records from [facility], dated March 06 2016    March 05 2016 1345, set onset right arm weakness, difficulty talking, went to the ER with Winferd Humphrey neurology, followed by TPA, with right upper extremity return to baseline, then 40 recovered, speech improved as well  Laboratory WBC 9.1, hematocrit 30 9.9, CT head in the emergency room was negative for acute stroke, MRI of the brain showed acute infarction in the left parietal white matter, there was also evidence of encephalomalacia in the right temporoparietal region, atrophy with probable remote infarction within the left occipital lobe and right cerebellum, , carotid ultrasound showed no hemodynamic significant lesions, chest x-ray showed no acute abnormality  Echocardiogram showed mild reduced ventricular function ejection fraction [id number], diastolic dysfunction.   Repeat lab March 08 2016, WBC 6.8, hemoglobin 12 point 1 INR 1.10, cardiolipin IgM, IgG IgA was negative, glucose 113, creatinine 0.83, cholesterol 157, triglyceride was mildly elevated 171, LDL was 88, ANA was negative, rheumatoid factor was negative

## 2016-04-22 ENCOUNTER — Telehealth: Payer: Self-pay | Admitting: Internal Medicine

## 2016-04-22 MED ORDER — ATORVASTATIN CALCIUM 40 MG PO TABS: 40.0000 mg | ORAL_TABLET | Freq: Every day | ORAL | 3 refills | Status: DC

## 2016-04-22 NOTE — Telephone Encounter (Signed)
REFILL SENT AS REQUESTED ./CY ?

## 2016-04-22 NOTE — Telephone Encounter (Signed)
New message     Pt states she is going out of town on Sunday and needs a refill on the Lipitor before she goes.       *STAT* If patient is at the pharmacy, call can be transferred to refill team.   1. Which medications need to be refilled? (please list name of each medication and dose if known) Lipitor 40 mg   2. Which pharmacy/location (including street and city if local pharmacy) is medication to be sent to? [facility] [address]   3. Do they need a 30 day or 90 day supply? [place]

## 2016-05-03 ENCOUNTER — Ambulatory Visit
Admission: RE | Admit: 2016-05-03 | Discharge: 2016-05-03 | Disposition: A | Discharge status: Home / Self Care | Payer: Self-pay | Source: Ambulatory Visit | Attending: Neurology | Admitting: Neurology

## 2016-05-03 DIAGNOSIS — G5139 Clonic hemifacial spasm, unspecified: Secondary | ICD-10-CM

## 2016-05-03 DIAGNOSIS — I63412 Cerebral infarction due to embolism of left middle cerebral artery: Secondary | ICD-10-CM

## 2016-05-03 DIAGNOSIS — G518 Other disorders of facial nerve: Secondary | ICD-10-CM

## 2016-05-03 MED ORDER — GADOBENATE DIMEGLUMINE 529 MG/ML IV SOLN
19.0000 mL | Freq: Once | INTRAVENOUS | Status: AC | PRN
  Administered 2016-05-03: 19 mL via INTRAVENOUS

## 2016-05-11 ENCOUNTER — Telehealth: Payer: Self-pay | Admitting: Neurology

## 2016-05-12 NOTE — Telephone Encounter (Signed)
error 

## 2016-05-19 ENCOUNTER — Encounter: Payer: Self-pay | Admitting: Neurology

## 2016-05-19 ENCOUNTER — Ambulatory Visit (INDEPENDENT_AMBULATORY_CARE_PROVIDER_SITE_OTHER): Payer: Self-pay | Admitting: Neurology

## 2016-05-19 VITALS — BP 159/74 | HR 66 | Ht 68.5 in | Wt 209.5 lb

## 2016-05-19 DIAGNOSIS — I6359 Cerebral infarction due to unspecified occlusion or stenosis of other cerebral artery: Secondary | ICD-10-CM

## 2016-05-19 DIAGNOSIS — G513 Clonic hemifacial spasm: Secondary | ICD-10-CM

## 2016-05-19 DIAGNOSIS — G5139 Clonic hemifacial spasm, unspecified: Secondary | ICD-10-CM

## 2016-05-19 NOTE — Progress Notes (Signed)
PATIENT: Ashley [name] DOB: Aug 15, 1966  Chief Complaint  Patient presents with  . Hemifacial Spasm    She was referred by Dr. Erlinda Hong to discuss treatment of her hemifacial spasm.     [place] the 50 years old right-handed female, seen in refer by my colleague Xu for evaluation of EMG guided botulism toxin injection for her right hemifacial spasm  May 19 2016    she had a history of coronary artery disease, status post CABG, mitral valve regurgitation status post mitral valve repair, dilated cardiomyopathy with previous ejection fraction 35-40% improved to normal now, previous history of stroke at age 21 while taking contraceptives and smoke, she quit  contraceptives since then, she presented with left hemiparesis, require prolonged physical therapy, was treated by Dr. love in the past,   She was admitted to [facility] in [place], [name] 03/05/16-03/08/16 for a new stroke  stroke and was treated with tPA. At the time, she was shopping in the store, felt some right arm tingling and then her daughter told her she has some facial droop. After sitting down, she was not able to talk for about 10 minutes. Symptoms gradually getting better in ER. Brain MRI demonstrated acute infarct within the left arietal white matter.  Echo EF 50% and bilateral carotid ultrasound no significant ICA stenosis.  She was discharged on Atorvastatin 80, Carvedilol 12.5 bid, Lisinopril 10 QD, Plavix 75 QD.  Since DC, she has been doing well.   She notes increasing bruising due to ASA+Plavix. Followed with cardiology and 30 day cardiac event monitoring ordered due to risk of  atrial fibrillation  She reported a history of gradual onset right facial twitching around her right eye since open heart surgery in 2013, gradually getting worse, triggered by stress, fatigue, today she had a prolonged episode of right hemifacial spasm during interview, to the point of blocking her right vision, she  stated that her right hemifacial spasm was present 25% of her daytime, no significant pain, sometimes affecting her driving because his cover her right eye for couple minutes, she denied right visual loss, no hearing loss, no history of right Bell's palsy.    REVIEW OF SYSTEMS: Full 14 system review of systems performed and notable only for as above   ALLERGIES: Allergies  Allergen Reactions  . Azithromycin Nausea Only    HOME MEDICATIONS: Current Outpatient Prescriptions  Medication Sig Dispense Refill  . aspirin EC 81 MG tablet Take 1 tablet (81 mg total) by mouth daily.    [name] atorvastatin (LIPITOR) 40 MG tablet Take 1 tablet (40 mg total) by mouth daily at 6 PM. 30 tablet 3  . carvedilol (COREG) 12.5 MG tablet take 1 and 1/2 tablet by mouth twice a day (MAY TAKE 1 EXTRA AS NEEDED) 95 tablet 11  . clopidogrel (PLAVIX) 75 MG tablet Take 1 tablet (75 mg total) by mouth daily. 60 tablet 11  . famotidine (PEPCID) 20 MG tablet Take 20 mg by mouth daily as needed. For indigestion    . lisinopril (PRINIVIL,ZESTRIL) 20 MG tablet Take 1 tablet (20 mg total) by mouth daily. 90 tablet 3  . vitamin C (ASCORBIC ACID) 500 MG tablet Take 500 mg by mouth 2 (two) times daily.     No current facility-administered medications for this visit.     PAST MEDICAL HISTORY: Past Medical History:  Diagnosis Date  . Abdominal pain    ? chronic cholecystitis; admxn to [facility] 9/12 (CT, USN, HIDA  done)  . Cardiomyopathy    echo 9/12: mild LVH, EF 35-40%, mod MR (difficult to judge /post leaflet restricted), mod BAE, mod RVE, mod TR   . Cardiomyopathy ([place])    a. echo 9/12: mild LVH, EF 35-40%, mod MR (difficult to judge /post leaflet restricted), mod BAE, mod RVE, mod TR. b. F/u echo 11/2011: mild LVH, EF 55-60%, s/p MV repair without significant MR/MS, mildly dilated RV/RA, PASP 29mmHg.   . Carotid artery disease ([place])    a. Duplex 06/2015: 50% RECA, 1-39% BICA.  [name] Coronary artery disease    a. s/p CABG 10/2011 -  LIMA-LAD, SVG-PDA, SVG-OM, SVG-diagonal.  . Dyslipidemia   . Essential hypertension   . Hemifacial spasm   . History of Doppler ultrasound    a. Carotid US [facility] in [place], [name]) 7/17: no hemodynamically significant ICA stenosis  . Mitral regurgitation    a. s/p MV repair 10/2011 - on Coumadin for 3 months afterwards then d/c'd by surgery.  . Recurrent upper respiratory infection (URI)   . 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 Stroke [facility])    a. age 62 - in setting of cigs and OCPs. //  b. s/p acute L parietal CVA >> tPA - [facility] in [place], [name]  . Tobacco abuse     PAST SURGICAL HISTORY: Past Surgical History:  Procedure Laterality Date  . CARDIAC CATHETERIZATION    . CESAREAN SECTION    . cesarean section    . CORONARY ARTERY BYPASS GRAFT  10/21/2011   Procedure: CORONARY ARTERY BYPASS GRAFTING (CABG);  Surgeon: [name], MD;  Location: [place];  Service: Open Heart Surgery;  Laterality: N/A;  cabg x four, using right leg greater saphenous vein harvested endoscopically  . DENTAL SURGERY    . MITRAL VALVE REPAIR  10/21/2011   Procedure: MITRAL VALVE REPAIR (MVR);  Surgeon: [name], MD;  Location[place];  Service: Open Heart Surgery;  Laterality: N/A;  . TEE WITHOUT CARDIOVERSION  06/30/2011   Procedure: TRANSESOPHAGEAL ECHOCARDIOGRAM (TEE);  Surgeon: [name], MD;  Location: [facility] ENDOSCOPY;  Service: Cardiovascular;  Laterality: N/A;  . TONSILLECTOMY    . TUBAL LIGATION      FAMILY HISTORY: Family History  Problem Relation Age of Onset  . Hypertension Mother   . Stroke Mother   . Heart disease Father   . COPD Father   . Anesthesia problems Neg Hx   . Hypotension Neg Hx   . Malignant hyperthermia Neg Hx   . Pseudochol deficiency Neg Hx     SOCIAL HISTORY:  Social History   Social History  . Marital status: Married    Spouse name: N/A  . Number of children: 32  . Years of education: 2   Occupational  History  . 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   Social History Main Topics  . Smoking status: Former Smoker    Packs/day: 1.00    Years: 30.00    Quit date: 05/07/2011  . Smokeless tobacco: Never Used  . Alcohol use 0.0 oz/week     Comment: 1 glass of red wine nightly  . Drug use: No  . Sexual activity: Yes   Other Topics Concern  . Not on file   Social History Narrative   Lives at home with husband and son.   Right-handed.   2 cups caffeine daily.  PHYSICAL EXAM   Vitals:   05/19/16 1250  BP: (!) 159/74  Pulse: 66  Weight: 209 lb 8 oz (95 kg)  Height: 5' 8.5" (1.74 m)    Not recorded      Body mass index is 31.39 kg/m.  PHYSICAL EXAMNIATION:  Gen: NAD, conversant, well nourised, obese, well groomed                     Cardiovascular: Regular rate rhythm, no peripheral edema, warm, nontender. Eyes: Conjunctivae clear without exudates or hemorrhage Neck: Supple, no carotid bruise. Pulmonary: Clear to auscultation bilaterally   NEUROLOGICAL EXAM:  MENTAL STATUS: Speech:    Speech is normal; fluent and spontaneous with normal comprehension.  Cognition:     Orientation to time, place and person     Normal recent and remote memory     Normal Attention span and concentration     Normal Language, naming, repeating,spontaneous speech     Fund of knowledge   CRANIAL NERVES: SHE HAD ONE EPISODE OF PROLONGED RIGHT HEMIFACIAL MUSCLE SPASM, MAINLY INVOLVING RIGHT ORBICULARIS ORIS OCULI, TO THE POINT OF COVERING HER RIGHT EYE, BUT IN BETWEEN MUSCLE SPASM, SHE HAD NO SIGNIFICANT ASYMMETRY OR WEAKNESS OF RIGHT EYE  CN II: Visual fields are full to confrontation. Fundoscopic exam is normal with sharp discs and no vascular changes. Pupils are round equal and briskly reactive to light. CN III, IV, VI: extraocular movement are normal. No ptosis. CN V: Facial sensation is intact to pinprick in all 3 divisions bilaterally. Corneal responses are  intact.  CN VII: Face is symmetric with normal eye closure and smile. CN VIII: Hearing is normal to rubbing fingers CN IX, X: Palate elevates symmetrically. Phonation is normal. CN XI: Head turning and shoulder shrug are intact CN XII: Tongue is midline with normal movements and no atrophy.  MOTOR: Fixation of left upper extremity upon rapid rotating movement,   REFLEXES: Reflexes are 2+ and symmetric at the biceps, triceps, knees, and ankles. Plantar responses are flexor.  SENSORY: Intact to light touch, pinprick, positional sensation and vibratory sensation are intact in fingers and toes.  COORDINATION: Rapid alternating movements and fine finger movements are intact. There is no dysmetria on finger-to-nose and heel-knee-shin.    GAIT/STANCE: Posture is normal. Gait is steady with normal steps, base, arm swing, and turning. Heel and toe walking are normal. Tandem gait is normal.  Romberg is absent.   DIAGNOSTIC DATA (LABS, IMAGING, TESTING) - I reviewed patient records, labs, notes, testing and imaging myself where available.   ASSESSMENT AND PLAN  [place] is a 50 y.o. female   Right hemifacial spasm   EMG guided Botox A injection  Stroke;  Vascular risk factor of coronary artery disease, status post mitral valve replacement, stroke, hyperlipidemia, tobacco abuse,   Marcial Pacas, M.D. Ph.D.  [facility] [address] [place], [place] [address] Ph: [phone] Fax: [phone]  CC: Referring Provider

## 2016-06-08 ENCOUNTER — Telehealth: Payer: Self-pay | Admitting: Physician Assistant

## 2016-06-08 ENCOUNTER — Encounter: Payer: Self-pay | Admitting: *Deleted

## 2016-06-08 NOTE — Telephone Encounter (Signed)
We have called patient several times after giving her the [employer] paper work to fill out and bring back with proof of income. Patient has not returned out calls and has not returned the paper work. Paper work was given to patient on 04/01/2016, we called her 09/18 and 10/18 about the paper work.   Ashley [name]

## 2016-06-08 NOTE — Progress Notes (Signed)
Patient ID: Ashley [name], female   DOB: 10-Jun-1966, 50 y.o.   MRN: [id number] Patient enrolled with [employer] for a cardiac event monitor to be mailed to her home pending approval of her [employer] hardship program application.

## 2016-06-14 ENCOUNTER — Telehealth: Payer: Self-pay | Admitting: Physician Assistant

## 2016-06-14 ENCOUNTER — Telehealth: Payer: Self-pay | Admitting: Internal Medicine

## 2016-06-14 ENCOUNTER — Telehealth: Payer: Self-pay | Admitting: Radiology

## 2016-06-14 NOTE — Telephone Encounter (Signed)
Wrong provider

## 2016-06-14 NOTE — Telephone Encounter (Signed)
Per [employer] patient was not approved for the hardship monitor program.

## 2016-06-14 NOTE — Telephone Encounter (Signed)
New Message:     Pt wants to know if she can come get her monitor put on here when it arrives? She says she is not good at doing things like hooking up or connecting things.

## 2016-06-14 NOTE — Telephone Encounter (Signed)
Message should had gone to the monitor tech and not to me. I will forward to monitor techs Darrick Penna and Joellen Jersey so that they may call the pt to help answer her question.

## 2016-06-15 ENCOUNTER — Encounter: Payer: Self-pay | Admitting: Neurology

## 2016-06-15 ENCOUNTER — Ambulatory Visit (INDEPENDENT_AMBULATORY_CARE_PROVIDER_SITE_OTHER): Payer: Self-pay | Admitting: Neurology

## 2016-06-15 VITALS — BP 144/73 | HR 70 | Ht 68.5 in | Wt 210.5 lb

## 2016-06-15 DIAGNOSIS — G5139 Clonic hemifacial spasm, unspecified: Secondary | ICD-10-CM

## 2016-06-15 DIAGNOSIS — G513 Clonic hemifacial spasm: Secondary | ICD-10-CM

## 2016-06-15 NOTE — Progress Notes (Signed)
PATIENT: Ashley [name] DOB: 10/02/1965  Chief Complaint  Patient presents with  . Hemifacial Spasm    Botox 100 units - sample supplied by [place] the 50 years old right-handed female, seen in refer by my colleague Xu for evaluation of EMG guided botulism toxin injection for her right hemifacial spasm  May 19 2016    she had a history of coronary artery disease, status post CABG, mitral valve regurgitation status post mitral valve repair, dilated cardiomyopathy with previous ejection fraction 35-40% improved to normal now, previous history of stroke at age 75 while taking contraceptives and smoke, she quit  contraceptives since then, she presented with left hemiparesis, require prolonged physical therapy, was treated by Dr. love in the past,   She was admitted to [facility] in [place], [name] 03/05/16-03/08/16 for a new stroke  stroke and was treated with tPA. At the time, she was shopping in the store, felt some right arm tingling and then her daughter told her she has some facial droop. After sitting down, she was not able to talk for about 10 minutes. Symptoms gradually getting better in ER. Brain MRI demonstrated acute infarct within the left arietal white matter.  Echo EF 50% and bilateral carotid ultrasound no significant ICA stenosis.  She was discharged on Atorvastatin 80, Carvedilol 12.5 bid, Lisinopril 10 QD, Plavix 75 QD.  Since DC, she has been doing well.   She notes increasing bruising due to ASA+Plavix. Followed with cardiology and 30 day cardiac event monitoring ordered due to risk of  atrial fibrillation  She reported a history of gradual onset right facial twitching around her right eye since open heart surgery in 2013, gradually getting worse, triggered by stress, fatigue, today she had a prolonged episode of right hemifacial spasm during interview, to the point of blocking her right vision, she stated that her right hemifacial  spasm was present 25% of her daytime, no significant pain, sometimes affecting her driving because his cover her right eye for couple minutes, she denied right visual loss, no hearing loss, no history of right Bell's palsy.   UPDATE Jun 15 2016: This is her first EMG guided botulism toxin injection for her spastic right hemifacial spasm, potential side effect was explained to her.  REVIEW OF SYSTEMS: Full 14 system review of systems performed and notable only for as above   ALLERGIES: Allergies  Allergen Reactions  . Azithromycin Nausea Only    HOME MEDICATIONS: Current Outpatient Prescriptions  Medication Sig Dispense Refill  . aspirin EC 81 MG tablet Take 1 tablet (81 mg total) by mouth daily.    [name] atorvastatin (LIPITOR) 40 MG tablet Take 1 tablet (40 mg total) by mouth daily at 6 PM. 30 tablet 3  . botulinum toxin Type A (BOTOX) 100 units SOLR injection Inject 100 Units into the muscle every 3 (three) months.    . carvedilol (COREG) 12.5 MG tablet take 1 and 1/2 tablet by mouth twice a day (MAY TAKE 1 EXTRA AS NEEDED) 95 tablet 11  . clopidogrel (PLAVIX) 75 MG tablet Take 1 tablet (75 mg total) by mouth daily. 60 tablet 11  . famotidine (PEPCID) 20 MG tablet Take 20 mg by mouth daily as needed. For indigestion    . lisinopril (PRINIVIL,ZESTRIL) 20 MG tablet Take 1 tablet (20 mg total) by mouth daily. 90 tablet 3  . vitamin C (ASCORBIC ACID) 500 MG tablet Take 500 mg by mouth 2 (  two) times daily.     No current facility-administered medications for this visit.     PAST MEDICAL HISTORY: Past Medical History:  Diagnosis Date  . Abdominal pain    ? chronic cholecystitis; admxn to [facility] 9/12 (CT, USN, HIDA done)  . Cardiomyopathy    echo 9/12: mild LVH, EF 35-40%, mod MR (difficult to judge /post leaflet restricted), mod BAE, mod RVE, mod TR   . Cardiomyopathy ([place])    a. echo 9/12: mild LVH, EF 35-40%, mod MR (difficult to judge /post leaflet restricted), mod BAE, mod RVE, mod TR. b.  F/u echo 11/2011: mild LVH, EF 55-60%, s/p MV repair without significant MR/MS, mildly dilated RV/RA, PASP 32mmHg.   . Carotid artery disease ([place])    a. Duplex 06/2015: 50% RECA, 1-39% BICA.  [name] Coronary artery disease    a. s/p CABG 10/2011 - LIMA-LAD, SVG-PDA, SVG-OM, SVG-diagonal.  . Dyslipidemia   . Essential hypertension   . Hemifacial spasm   . History of Doppler ultrasound    a. Carotid US [facility] in [place], [name]) 7/17: no hemodynamically significant ICA stenosis  . Mitral regurgitation    a. s/p MV repair 10/2011 - on Coumadin for 3 months afterwards then d/c'd by surgery.  . Recurrent upper respiratory infection (URI)   . 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 Stroke [facility])    a. age 65 - in setting of cigs and OCPs. //  b. s/p acute L parietal CVA >> tPA - [facility] in [place], [name]  . Tobacco abuse     PAST SURGICAL HISTORY: Past Surgical History:  Procedure Laterality Date  . CARDIAC CATHETERIZATION    . CESAREAN SECTION    . cesarean section    . CORONARY ARTERY BYPASS GRAFT  10/21/2011   Procedure: CORONARY ARTERY BYPASS GRAFTING (CABG);  Surgeon: [name], MD;  Location: [place];  Service: Open Heart Surgery;  Laterality: N/A;  cabg x four, using right leg greater saphenous vein harvested endoscopically  . DENTAL SURGERY    . MITRAL VALVE REPAIR  10/21/2011   Procedure: MITRAL VALVE REPAIR (MVR);  Surgeon: [name], MD;  Location: [place];  Service: Open Heart Surgery;  Laterality: N/A;  . TEE WITHOUT CARDIOVERSION  06/30/2011   Procedure: TRANSESOPHAGEAL ECHOCARDIOGRAM (TEE);  Surgeon: [name], MD;  Location: [facility] ENDOSCOPY;  Service: Cardiovascular;  Laterality: N/A;  . TONSILLECTOMY    . TUBAL LIGATION      FAMILY HISTORY: Family History  Problem Relation Age of Onset  . Hypertension Mother   . Stroke Mother   . Heart disease Father   . COPD Father   . Anesthesia problems Neg Hx   . Hypotension Neg Hx     . Malignant hyperthermia Neg Hx   . Pseudochol deficiency Neg Hx     SOCIAL HISTORY:  Social History   Social History  . Marital status: Married    Spouse name: N/A  . Number of children: 92  . Years of education: 2   Occupational History  . 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   Social History Main Topics  . Smoking status: Former Smoker    Packs/day: 1.00    Years: 30.00    Quit date: 05/07/2011  . Smokeless tobacco: Never Used  . Alcohol use 0.0 oz/week     Comment: 1 glass of red wine nightly  .  Drug use: No  . Sexual activity: Yes   Other Topics Concern  . Not on file   Social History Narrative   Lives at home with husband and son.   Right-handed.   2 cups caffeine daily.        PHYSICAL EXAM   Vitals:   06/15/16 1619  BP: (!) 144/73  Pulse: 70  Weight: 210 lb 8 oz (95.5 kg)  Height: 5' 8.5" (1.74 m)    Not recorded      Body mass index is 31.54 kg/m.  PHYSICAL EXAMNIATION:  Gen: NAD, conversant, well nourised, obese, well groomed                     Cardiovascular: Regular rate rhythm, no peripheral edema, warm, nontender. Eyes: Conjunctivae clear without exudates or hemorrhage Neck: Supple, no carotid bruise. Pulmonary: Clear to auscultation bilaterally   NEUROLOGICAL EXAM:  MENTAL STATUS: Speech:    Speech is normal; fluent and spontaneous with normal comprehension.  Cognition:     Orientation to time, place and person     Normal recent and remote memory     Normal Attention span and concentration     Normal Language, naming, repeating,spontaneous speech     Fund of knowledge   CRANIAL NERVES: SHE HAD ONE EPISODE OF PROLONGED RIGHT HEMIFACIAL MUSCLE SPASM, MAINLY INVOLVING RIGHT ORBICULARIS ORIS OCULI, TO THE POINT OF COVERING HER RIGHT EYE, BUT IN BETWEEN MUSCLE SPASM, SHE HAD NO SIGNIFICANT ASYMMETRY OR WEAKNESS OF RIGHT EYE  CN II: Visual fields are full to confrontation. Fundoscopic exam is normal with  sharp discs and no vascular changes. Pupils are round equal and briskly reactive to light. CN III, IV, VI: extraocular movement are normal. No ptosis. CN V: Facial sensation is intact to pinprick in all 3 divisions bilaterally. Corneal responses are intact.  CN VII: Face is symmetric with normal eye closure and smile. CN VIII: Hearing is normal to rubbing fingers CN IX, X: Palate elevates symmetrically. Phonation is normal. CN XI: Head turning and shoulder shrug are intact CN XII: Tongue is midline with normal movements and no atrophy.  MOTOR: Fixation of left upper extremity upon rapid rotating movement,   REFLEXES: Reflexes are 2+ and symmetric at the biceps, triceps, knees, and ankles. Plantar responses are flexor.  SENSORY: Intact to light touch, pinprick, positional sensation and vibratory sensation are intact in fingers and toes.  COORDINATION: Rapid alternating movements and fine finger movements are intact. There is no dysmetria on finger-to-nose and heel-knee-shin.    GAIT/STANCE: Posture is normal. Gait is steady with normal steps, base, arm swing, and turning. Heel and toe walking are normal. Tandem gait is normal.  Romberg is absent.   DIAGNOSTIC DATA (LABS, IMAGING, TESTING) - I reviewed patient records, labs, notes, testing and imaging myself where available.   ASSESSMENT AND PLAN  [place] is a 50 y.o. female   Right hemifacial spasm     This is her first injection on June 15 2016  Under EMG guidance, used BOTOX A  100 units/2 cc of NS, (she gets Botox A 100 units through patient assistance program)  Used total 32.5 units, discard 17.5 units  Right orbicularis oculi at 4, 6, 7, 8, 9:00 2.5 units eachx5= 12.5 units total Right corrugate 5 units Left corrugate 5 units   Right frontalis 2.5 units times 2= 5 units Left orbicularis oculi at 3, 4:00, 2.5 units x2=5 units  Stroke;  Vascular risk factor of coronary  artery disease, status post mitral  valve replacement, stroke, hyperlipidemia, tobacco abuse,   Marcial Pacas, M.D. Ph.D.  [facility] [address] [place], [place] [address] Ph: [phone] Fax: [phone]  CC: Referring Provider

## 2016-06-15 NOTE — Progress Notes (Signed)
**  Botox 100 units x 1 vial, Lot [id number], Exp 11/2018, sample supplied by company.[email or web address]**

## 2016-06-16 ENCOUNTER — Telehealth: Payer: Self-pay | Admitting: Physician Assistant

## 2016-06-16 NOTE — Telephone Encounter (Signed)
Follow Up:     Pt calling again,says she will need help putting on her monitor.

## 2016-06-16 NOTE — Telephone Encounter (Signed)
Ashley [name] -  Should she see EP to see if she should have an ILR? thx [name]

## 2016-06-16 NOTE — Telephone Encounter (Signed)
Per Earlie Server at [place] the patient needs to contact [employer] to start her self paid monitor.

## 2016-06-17 ENCOUNTER — Encounter (INDEPENDENT_AMBULATORY_CARE_PROVIDER_SITE_OTHER): Payer: Self-pay

## 2016-06-17 DIAGNOSIS — I4891 Unspecified atrial fibrillation: Secondary | ICD-10-CM

## 2016-06-17 DIAGNOSIS — Z8673 Personal history of transient ischemic attack (TIA), and cerebral infarction without residual deficits: Secondary | ICD-10-CM

## 2016-06-21 ENCOUNTER — Ambulatory Visit (INDEPENDENT_AMBULATORY_CARE_PROVIDER_SITE_OTHER): Payer: Self-pay | Admitting: Neurology

## 2016-06-21 ENCOUNTER — Encounter: Payer: Self-pay | Admitting: Neurology

## 2016-06-21 VITALS — BP 163/79 | HR 69 | Ht 68.5 in | Wt 212.6 lb

## 2016-06-21 DIAGNOSIS — G5139 Clonic hemifacial spasm, unspecified: Secondary | ICD-10-CM

## 2016-06-21 DIAGNOSIS — Z951 Presence of aortocoronary bypass graft: Secondary | ICD-10-CM

## 2016-06-21 DIAGNOSIS — Z9889 Other specified postprocedural states: Secondary | ICD-10-CM

## 2016-06-21 DIAGNOSIS — G513 Clonic hemifacial spasm: Secondary | ICD-10-CM

## 2016-06-21 DIAGNOSIS — D6861 Antiphospholipid syndrome: Secondary | ICD-10-CM

## 2016-06-21 DIAGNOSIS — I6340 Cerebral infarction due to embolism of unspecified cerebral artery: Secondary | ICD-10-CM

## 2016-06-21 DIAGNOSIS — E538 Deficiency of other specified B group vitamins: Secondary | ICD-10-CM | POA: Insufficient documentation

## 2016-06-21 MED ORDER — VITAMIN B-12 1000 MCG PO TABS: 1000.0000 ug | ORAL_TABLET | Freq: Every day | ORAL | Status: AC

## 2016-06-21 NOTE — Progress Notes (Signed)
NEUROLOGY CLINIC FOLLOW UP NOTE  NAME: Ashley [name] DOB: [birth year]  HPI: Ashley [name] is a 50 y.o. 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   Pt had a stroke when she was 50 yo in the setting of OCP and smoking. She quit OCP since then. At that time, she woke up in the morning, could not talk at all with left arm weakness without leg weakness. She had PT/OT for 6 months with residual of slurry speech and left hand dexterity difficulty when she got tired or sick. She had followed with [name] at [facility] in the past.  She was admitted to [facility] in [place], [name] 03/05/16-03/08/16 for a stroke and treated with tPA. At the time, she was shopping in the store, felt some right arm tingling and then her daughter told her she has some facial droop. After sitting down, she was not able to talk for about 10 minutes. Symptoms gradually getting better in ER. Brain MRI demonstrated acute infarct within the L parietal white matter.  Echo EF 50% and CUS no significant ICA stenosis.  She was DC on Atorvastatin 80, Carvedilol 12.5 bid, Lisinopril 10 QD, Plavix 75 QD.  Since DC, she has been doing well.  She still has some speech difficulty which she thinks is chronic from previous stroke. She notes increasing bruising due to ASA+Plavix. Followed with cardiology and 30 day cardiac event monitoring ordered due to risk of afib.   She also has frequent right facial twitching and eye blinking since the heart surgery 4 years ago. It happens every day, worse with stress and fatigue. 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 LHC in 05/2011 demonstrated three-vessel CAD. TEE demonstrated severe mitral regurgitation, no  PFO. She had CABG and MV repair by Dr. [name] on 10/21/11.   History of HTN, on Coreg and lisinopril. At home blood pressure 140-145, today BP 175/78. Patient complains of white coat syndrome.  History of HLD, on Lipitor 40 at home. LDL 06/2015 was 91 and 07/2015 was 67.  She was smoker and quit smoking last month after the second stroke. Social alcohol, denies any illicit drug use.  Interval history: During the interval time, she has been doing well without stroke like symptoms. Currently she has 30 day cardiac event monitoring ongoing. She has botox injection for hemifacial spasm and right face looks better. 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 Her B12 low and currently on B12 supplement. Continued no smoking. BP today in clinic 163/79, but stated anxious in clinic.   Past Medical History:  Diagnosis Date  . Abdominal pain    ? chronic cholecystitis; admxn to [facility] 9/12 (CT, USN, HIDA done)  . Cardiomyopathy    echo 9/12: mild LVH, EF 35-40%, mod MR (difficult to judge /post leaflet restricted), mod BAE, mod RVE, mod TR   . Cardiomyopathy ([place])    a. echo 9/12: mild LVH, EF 35-40%, mod MR (difficult to judge /post leaflet restricted), mod BAE,  mod RVE, mod TR. b. F/u echo 11/2011: mild LVH, EF 55-60%, s/p MV repair without significant MR/MS, mildly dilated RV/RA, PASP 30mmHg.   . Carotid artery disease ([place])    a. Duplex 06/2015: 50% RECA, 1-39% BICA.  [name] Coronary artery disease    a. s/p CABG 10/2011 - LIMA-LAD, SVG-PDA, SVG-OM, SVG-diagonal.  . Dyslipidemia   . Essential hypertension   . Hemifacial spasm   . History of Doppler ultrasound    a. Carotid US [facility] in [place], [name]) 7/17: no hemodynamically significant ICA stenosis  . Mitral  regurgitation    a. s/p MV repair 10/2011 - on Coumadin for 3 months afterwards then d/c'd by surgery.  . Recurrent upper respiratory infection (URI)   . 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 Stroke [facility])    a. age 83 - in setting of cigs and OCPs. //  b. s/p acute L parietal CVA >> tPA - [facility] in [place], [name]  . Tobacco abuse    Past Surgical History:  Procedure Laterality Date  . CARDIAC CATHETERIZATION    . CESAREAN SECTION    . cesarean section    . CORONARY ARTERY BYPASS GRAFT  10/21/2011   Procedure: CORONARY ARTERY BYPASS GRAFTING (CABG);  Surgeon: [name], MD;  Location: [place];  Service: Open Heart Surgery;  Laterality: N/A;  cabg x four, using right leg greater saphenous vein harvested endoscopically  . DENTAL SURGERY    . MITRAL VALVE REPAIR  10/21/2011   Procedure: MITRAL VALVE REPAIR (MVR);  Surgeon: [name], MD;  Location: [place];  Service: Open Heart Surgery;  Laterality: N/A;  . TEE WITHOUT CARDIOVERSION  06/30/2011   Procedure: TRANSESOPHAGEAL ECHOCARDIOGRAM (TEE);  Surgeon: [name], MD;  Location: [facility] ENDOSCOPY;  Service: Cardiovascular;  Laterality: N/A;  . TONSILLECTOMY    . TUBAL LIGATION     Family History  Problem Relation Age of Onset  . Hypertension Mother   . Stroke Mother   . Heart disease Father   . COPD Father   . Anesthesia problems Neg Hx   . Hypotension Neg Hx   . Malignant hyperthermia Neg Hx   . Pseudochol deficiency Neg Hx    Current Outpatient Prescriptions  Medication Sig Dispense Refill  . aspirin EC 81 MG tablet Take 1 tablet (81 mg total) by mouth daily.    [name] atorvastatin (LIPITOR) 40 MG tablet Take 1 tablet (40 mg total) by mouth daily at 6 PM. 30 tablet 3  . botulinum toxin Type A (BOTOX) 100 units SOLR injection Inject 100 Units into the muscle every 3 (three) months.    . carvedilol (COREG) 12.5 MG tablet take 1 and 1/2 tablet by mouth twice a day (MAY TAKE 1 EXTRA AS NEEDED) 95  tablet 11  . clopidogrel (PLAVIX) 75 MG tablet Take 1 tablet (75 mg total) by mouth daily. 60 tablet 11  . famotidine (PEPCID) 20 MG tablet Take 20 mg by mouth daily as needed. For indigestion    . lisinopril (PRINIVIL,ZESTRIL) 20 MG tablet Take 1 tablet (20 mg total) by mouth daily. 90 tablet 3  . vitamin C (ASCORBIC ACID) 500 MG tablet Take 500 mg by mouth 2 (two) times daily.    . vitamin B-12 (CYANOCOBALAMIN) 1000 MCG tablet Take 1 tablet (1,000 mcg total) by mouth daily.     No current facility-administered medications for this visit.    Allergies  Allergen Reactions  . Azithromycin Nausea Only  . Latex     rash   Social History   Social History  . Marital status: Married    Spouse name: N/A  . Number of children: 28  . Years of education: 2   Occupational History  . 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   Social History Main Topics  . Smoking status: Former Smoker    Packs/day: 1.00    Years: 30.00    Quit date: 05/07/2011  . Smokeless tobacco: Never Used  . Alcohol use 0.0 oz/week     Comment: 1 glass of red wine nightly  . Drug use: No  . Sexual activity: Yes   Other Topics Concern  . Not on file   Social History Narrative   Lives at home with husband and son.   Right-handed.   2 cups caffeine daily.       Review of Systems Full 14 system review of systems performed and notable only for those listed, all others are neg:  Constitutional:   Cardiovascular:  Ear/Nose/Throat:   Skin:  Eyes:   Respiratory:   Gastroitestinal:   Genitourinary:  Hematology/Lymphatic:   Endocrine:  Musculoskeletal:   Allergy/Immunology: latex allergy Neurological:   Psychiatric:  Sleep:    Physical Exam  Vitals:   06/21/16 1102  BP: (!) 163/79  Pulse: 69    General - obese, well developed, in no apparent distress,.  Ophthalmologic - Fundi not visualized due to mild hemifacial spasm.  Cardiovascular - Regular rate and rhythm. Carotid  pulses were 2+ without bruits .   Neck - supple, no nuchal rigidity .  Mental Status -  Level of arousal and orientation to time, place, and person were intact. Language including expression, naming, repetition, comprehension, reading, and writing was assessed and found intact. Occasional stuttering due to anxiety and stress Attention span and concentration were normal. Recent and remote memory were intact. Fund of Knowledge was assessed and was intact.  Cranial Nerves II - XII - II - Visual field intact OU. III, IV, VI - Extraocular movements intact. V - Facial sensation intact bilaterally. VII - Facial movement intact bilaterally, infrequent episodes of mild right facial twitching and eye blinking consistent with botoxed treated hemifacial spasm. VIII - Hearing & vestibular intact bilaterally. X - Palate elevates symmetrically. XI - Chin turning & shoulder shrug intact bilaterally. XII - Tongue protrusion intact.  Motor Strength - The patient's strength was normal in all extremities except left hand mild dexterity difficulties and pronator drift was absent.  Bulk was normal and fasciculations were absent.   Motor Tone - Muscle tone was assessed at the neck and appendages and was normal.  Reflexes - The patient's reflexes were normal in all extremities and she had no pathological reflexes.  Sensory - Light touch, temperature/pinprick were assessed and were normal.    Coordination - The patient had normal movements in the hands and feet with no ataxia or dysmetria.  Tremor was absent.  Gait and Station - The patient's transfers, posture, gait, station, and turns were observed as normal.   Imaging  Brain imaging not available for review. However, as per note:  CT head x 2 - no acute abnormalities.  MRI brain - acute infarct within the L parietal white matter.    TTE - EF 50%   CUS - no significant ICA stenosis.   Mra Head Wo Contrast 05/05/2016 1. There is mild flow  signal irregularity  in the inferior basilar artery without significant stenosis. 2. The right P1 segment of posterior cerebral artery has decreased flow signal may be due to atherosclerosis resulting in mild to moderate stenosis. 3. Remaining medium-large sized vessels are unremarkable.  Mr Kizzie Fantasia Contrast 05/05/2016 1. Chronic ischemic infarction with encephalomalacia and gliosis in the right right fronto-parietal peri-rolandic region (3.6x3.9cm). 2. Additional smaller chronic ischemic infarctions in the bilateral parietal cortical, left occipital cortical, left fronto-parietal subcortical, and bilateral cerebellar regions. 3. Small right parietal chronic cerebral microhemorrhage. 4. No acute findings.   30 day cardiac event monitoring - pending   Lab Review  Component     Latest Ref Rng & Units 06/23/2015 08/05/2015  Total Bilirubin     0.2 - 1.2 mg/dL  0.9  Bilirubin, Direct     <=0.2 mg/dL  0.2  Indirect Bilirubin     0.2 - 1.2 mg/dL  0.7  Alkaline Phosphatase     33 - 130 U/L  95  AST     10 - 35 U/L  19  ALT     6 - 29 U/L  19  Total Protein     6.1 - 8.1 g/dL  7.1  Albumin     3.6 - 5.1 g/dL  4.1  Cholesterol     125 - 200 mg/dL 157 146  Triglycerides     <150 mg/dL 107 201 (H)  HDL Cholesterol     >=46 mg/dL 45 (L) 39 (L)  Total CHOL/HDL Ratio     <=5.0 Ratio 3.5 3.7  VLDL     <30 mg/dL 21 40 (H)  LDL (calc)     <130 mg/dL 91 67  Hemoglobin A1C     <5.7 % 5.4   Mean Plasma Glucose     <117 mg/dL 108   TSH     0.350 - 4.500 uIU/mL 1.904    Component     Latest Ref Rng & Units 04/12/2016  Prothrombin Time     sec 11.0  INR     ratio 1.1  APTT     sec 29.1  APTT 1:1 NP     sec CANCELED  APTT 1:1 Saline     Sec CANCELED  THROMBIN TIME     sec 17.2  DRVVT Screen Seconds     sec 47.2 (H)  DRVVT Confirm Seconds     sec 34.7  DRVVT Ratio     ratio 1.3 (H)  Hexagonal Phospholipid Neutral     sec 9  Platelet Neutralization     sec 0.0    Anticardiolipin Ab, IgG     GPL 16  Anticardiolipin Ab, IgM     MPL <10  Beta-2 Glycoprotein I, IgG     SGU 93 (H)  Beta-2 Glycoprotein I, IgM     SMU <10  Beta-2 Glycoprotein I, IgA     SAU 14  LAC Interpretation      Comment  ENA RNP Ab     0.0 - 0.9 AI 0.4  ENA SM Ab Ser-aCnc     0.0 - 0.9 AI <0.2  RA Latex Turbid.     0.0 - 13.9 IU/mL <10.0  Chromatin Ab SerPl-aCnc     0.0 - 0.9 AI <0.2  ENA SSA (RO) Ab     0.0 - 0.9 AI <0.2  ENA SSB (LA) Ab     0.0 - 0.9 AI <0.2  dsDNA Ab     0 - 9 IU/mL 3  Beta-2 Glycoprotein I Ab, IgG     0 - 20 GPI IgG units 74 (H)  Beta-2 Glyco 1 IgM     0 - 32 GPI IgM units <9  Anticardiolipin Ab,IgG,Qn     0 - 14 GPL U/mL 13  Anticardiolipin Ab,IgM,Qn     0 - 12 MPL U/mL <9  Hemoglobin A1C     4.8 - 5.6 % 5.5  Est. average glucose Bld gHb Est-mCnc     mg/dL 111  TSH     0.450 - 4.500 uIU/mL 1.550  T4,Free(Direct)     0.82 - 1.77 ng/dL 1.06  Homocysteine     0.0 - 15.0 umol/L 20.5 (H)  RPR     Non Reactive Non Reactive  Vitamin B12     211 - 946 pg/mL 184 (L)  HIV     Non Reactive Non Reactive     Assessment:   In summary, Ashley [name] is a 50 y.o. 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 Echo EF 50% and CUS no significant ICA stenosis.  She was DC on Atorvastatin 80, Carvedilol 12.5 bid, Lisinopril 10 QD, Plavix 75 QD.  Since DC, she has been doing well.  She still has some speech difficulty which she thinks is chronic from previous stroke. She is on ASA+Plavix now, will continue at this time. Followed with cardiology and 30 day cardiac event monitoring ongoing now. She had TEE in 2012 showed no PFO, MRI in 04/2016 showed multiple bilateral old anterior and posterior circulation infarcts  indicting embolic events. MRA unremarkable. B12 was low and put on B12 supplement. Hypercoagulable showed mild elevated homocystenine level but high titer of beta 2 glycoprotein IgG. Autoimmune work up negative. She had one pregnancy with twin but almost premature at 45w, she was put on bed rest for 14 weeks before giving birth. She had tube ligation after.   Plan: - continue ASA and plavix and lipitor for stroke prevention - will repeat B12 and beta 2 glycoprotein Abs in one month   - Follow up with your primary care physician for stroke risk factor modification. Recommend maintain blood pressure goal <130/80, diabetes with hemoglobin A1c goal below 7.0% and lipids with LDL cholesterol goal below 70 mg/dL.  - continue follow up with cardiology - continue botox for hemifacial spasm - check BP at home and record - continue abstain from smoking - healthy diet and regular exercise. - follow up with 30 day cardiac event monitoring - continue B12 supplement. - need to apply for patient assistance program due to self pay.  - follow up in 6 months.   I spent more than 25 minutes of face to face time with the patient. Greater than 50% of time was spent in counseling and coordination of care. We discussed about antiphospholipid syndrome, its diagnosis and management, B12 deficiency and treatment, as well as continued botox for semifacial spasm.     Orders Placed This Encounter  Procedures  . Vitamin B12    Standing Status:   Future    Standing Expiration Date:   06/21/2017  . Beta-2 Glycoprotein I Ab,G/M    Standing Status:   Future    Standing Expiration Date:  06/21/2017    Meds ordered this encounter  Medications  . vitamin B-12 (CYANOCOBALAMIN) 1000 MCG tablet    Sig: Take 1 tablet (1,000 mcg total) by mouth daily.    Patient Instructions  - continue ASA and plavix and lipitor for stroke prevention - will repeat lab work in one month to evaluate the condition called antiphospholipid  syndrome.  - if the repeat still high, we need to refer you to hematologist to confirm the finding. And then develop treatment plan. - Follow up with your primary care physician for stroke risk factor modification. Recommend maintain blood pressure goal <130/80, diabetes with hemoglobin A1c goal below 7.0% and lipids with LDL cholesterol goal below 70 mg/dL.  - continue follow up with cardiology - continue botox for hemifacial spasm - check BP at home and record - continue abstain from smoking - healthy diet and regular exercise. - follow up with 30 day cardiac event monitoring - need to contract patient assistance program to help for the billing.  - follow up in 6 months.    Rosalin Hawking, MD PhD [facility] [address], [place], [place] [address] [phone]

## 2016-06-21 NOTE — Patient Instructions (Addendum)
-   continue ASA and plavix and lipitor for stroke prevention - will repeat lab work in one month to evaluate the condition called antiphospholipid syndrome.  - if the repeat still high, we need to refer you to hematologist to confirm the finding. And then develop treatment plan. - Follow up with your primary care physician for stroke risk factor modification. Recommend maintain blood pressure goal <130/80, diabetes with hemoglobin A1c goal below 7.0% and lipids with LDL cholesterol goal below 70 mg/dL.  - continue follow up with cardiology - continue botox for hemifacial spasm - check BP at home and record - continue abstain from smoking - healthy diet and regular exercise. - follow up with 30 day cardiac event monitoring - need to contract patient assistance program to help for the billing.  - follow up in 6 months.

## 2016-06-24 NOTE — Telephone Encounter (Signed)
Will forward to A Lynnell Jude to see if pt a candidate for ILR

## 2016-07-01 ENCOUNTER — Other Ambulatory Visit: Payer: Self-pay | Admitting: *Deleted

## 2016-07-01 DIAGNOSIS — E785 Hyperlipidemia, unspecified: Secondary | ICD-10-CM

## 2016-07-01 LAB — HEPATIC FUNCTION PANEL
ALBUMIN: 4.1 g/dL (ref 3.6–5.1)
ALK PHOS: 90 U/L (ref 33–130)
ALT: 16 U/L (ref 6–29)
AST: 15 U/L (ref 10–35)
BILIRUBIN INDIRECT: 0.6 mg/dL (ref 0.2–1.2)
Bilirubin, Direct: 0.1 mg/dL (ref <=0.2)
TOTAL PROTEIN: 7 g/dL (ref 6.1–8.1)
Total Bilirubin: 0.7 mg/dL (ref 0.2–1.2)

## 2016-07-01 LAB — LIPID PANEL
CHOLESTEROL: 130 mg/dL (ref <200)
HDL: 34 mg/dL — AB (ref >50)
LDL CALC: 71 mg/dL (ref <100)
TRIGLYCERIDES: 123 mg/dL (ref <150)
Total CHOL/HDL Ratio: 3.8 Ratio (ref <5.0)
VLDL: 25 mg/dL (ref <30)

## 2016-07-04 ENCOUNTER — Telehealth: Payer: Self-pay | Admitting: *Deleted

## 2016-07-04 NOTE — Telephone Encounter (Signed)
Pt notified of lab results by phone with verbal understanding. Pt confirmed her appt 11/20 with Dr. Harrington Challenger.

## 2016-07-05 ENCOUNTER — Other Ambulatory Visit: Payer: Self-pay | Admitting: Internal Medicine

## 2016-07-10 NOTE — Progress Notes (Signed)
Cardiology Office Note   Date:  07/11/2016   ID:  Ashley [name], DOB [birth year], MRN [id number]  PCP:  No primary care provider on file.  Cardiologist:   Dorris Carnes, MD   F/U of CAD     History of Present Illness: Ashley [name] is a 50 y.o. female with a history of CAD (s/p CABG 2012), MVP with severe MR (s/p repair)  Dilated CM (LVEF normalized), HTN, HL and tobac use.  She was seen by D Dunn in 2016  Suffeered a CVA in 2017 July.  Rx with TPA  LVEF 50% She was seen by Kathleen Argue in Aug 2017    Breathing is OK  No CP Spasms improving       Outpatient Medications Prior to Visit  Medication Sig Dispense Refill  . aspirin EC 81 MG tablet Take 1 tablet (81 mg total) by mouth daily.    [name] atorvastatin (LIPITOR) 40 MG tablet Take 1 tablet (40 mg total) by mouth daily at 6 PM. 30 tablet 3  . botulinum toxin Type A (BOTOX) 100 units SOLR injection Inject 100 Units into the muscle every 3 (three) months.    . carvedilol (COREG) 12.5 MG tablet take 1 and 1/2 tablet by mouth twice a day (MAY TAKE 1 EXTRA AS NEEDED) 95 tablet 5  . clopidogrel (PLAVIX) 75 MG tablet Take 1 tablet (75 mg total) by mouth daily. 60 tablet 11  . famotidine (PEPCID) 20 MG tablet Take 20 mg by mouth daily as needed. For indigestion    . lisinopril (PRINIVIL,ZESTRIL) 20 MG tablet Take 1 tablet (20 mg total) by mouth daily. 90 tablet 3  . vitamin B-12 (CYANOCOBALAMIN) 1000 MCG tablet Take 1 tablet (1,000 mcg total) by mouth daily.    . vitamin C (ASCORBIC ACID) 500 MG tablet Take 500 mg by mouth 2 (two) times daily.     No facility-administered medications prior to visit.      Allergies:   Azithromycin and Latex   Past Medical History:  Diagnosis Date  . Abdominal pain    ? chronic cholecystitis; admxn to [facility] 9/12 (CT, USN, HIDA done)  . Cardiomyopathy    echo 9/12: mild LVH, EF 35-40%, mod MR (difficult to judge /post leaflet restricted), mod BAE, mod RVE, mod TR   . Cardiomyopathy ([place])    a. echo 9/12:  mild LVH, EF 35-40%, mod MR (difficult to judge /post leaflet restricted), mod BAE, mod RVE, mod TR. b. F/u echo 11/2011: mild LVH, EF 55-60%, s/p MV repair without significant MR/MS, mildly dilated RV/RA, PASP 34mmHg.   . Carotid artery disease ([place])    a. Duplex 06/2015: 50% RECA, 1-39% BICA.  [name] Coronary artery disease    a. s/p CABG 10/2011 - LIMA-LAD, SVG-PDA, SVG-OM, SVG-diagonal.  . Dyslipidemia   . Essential hypertension   . Hemifacial spasm   . History of Doppler ultrasound    a. Carotid US [facility] in [place], [name]) 7/17: no hemodynamically significant ICA stenosis  . Mitral regurgitation    a. s/p MV repair 10/2011 - on Coumadin for 3 months afterwards then d/c'd by surgery.  . Recurrent upper respiratory infection (URI)   . 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 Stroke [facility])    a. age 57 - in setting of cigs and OCPs. //  b. s/p acute L parietal CVA >> tPA - [facility] in [place], [name]  . Tobacco abuse  Past Surgical History:  Procedure Laterality Date  . CARDIAC CATHETERIZATION    . CESAREAN SECTION    . cesarean section    . CORONARY ARTERY BYPASS GRAFT  10/21/2011   Procedure: CORONARY ARTERY BYPASS GRAFTING (CABG);  Surgeon: [name], MD;  Location: [place];  Service: Open Heart Surgery;  Laterality: N/A;  cabg x four, using right leg greater saphenous vein harvested endoscopically  . DENTAL SURGERY    . MITRAL VALVE REPAIR  10/21/2011   Procedure: MITRAL VALVE REPAIR (MVR);  Surgeon: [name], MD;  Location: [place];  Service: Open Heart Surgery;  Laterality: N/A;  . TEE WITHOUT CARDIOVERSION  06/30/2011   Procedure: TRANSESOPHAGEAL ECHOCARDIOGRAM (TEE);  Surgeon: [name], MD;  Location: [facility] ENDOSCOPY;  Service: Cardiovascular;  Laterality: N/A;  . TONSILLECTOMY    . TUBAL LIGATION       Social History:  The patient  reports that she quit smoking about 5 years ago. She has a 30.00 pack-year smoking history. She  has never used smokeless tobacco. She reports that she drinks alcohol. She reports that she does not use drugs.   Family History:  The patient's family history includes COPD in her father; Heart disease in her father; Hypertension in her mother; Stroke in her mother.    ROS:  Please see the history of present illness. All other systems are reviewed and  Negative to the above problem except as noted.    PHYSICAL EXAM: VS:  BP 132/68   Pulse 65   Ht 5' 8.5" (1.74 m)   Wt 214 lb 1.9 oz (97.1 kg)   SpO2 99%   BMI 32.08 kg/m   GEN: Well nourished, well developed, in no acute distress  HEENT: normal  Neck: no JVD, carotid bruits, or masses Cardiac: RRR; no murmurs, rubs, or gallops,no edema  Respiratory:  clear to auscultation bilaterally, normal work of breathing GI: soft, nontender, nondistended, + BS  No hepatomegaly  MS: no deformity Moving all extremities   Skin: warm and dry, no rash Neuro:  Strength and sensation are intact Psych: euthymic mood, full affect   EKG:  EKG is ordered today.   Lipid Panel    Component Value Date/Time   CHOL 130 07/01/2016 0939   TRIG 123 07/01/2016 0939   HDL 34 (L) 07/01/2016 0939   CHOLHDL 3.8 07/01/2016 0939   VLDL 25 07/01/2016 0939   LDLCALC 71 07/01/2016 0939      Wt Readings from Last 3 Encounters:  07/11/16 214 lb 1.9 oz (97.1 kg)  06/21/16 212 lb 9.6 oz (96.4 kg)  06/15/16 210 lb 8 oz (95.5 kg)      ASSESSMENT AND PLAN:  1 Neuro  CVA   Wearing monitor now  Follows in neuro  Botox in Jan  2  CAD  No symptoms of angina  3  HL  Good control 4  HTN  BP is adequate  F/U in may   Current medicines are reviewed at length with the patient today.  The patient does not have concerns regarding medicines.  Signed, Dorris Carnes, MD  07/11/2016 10:31 AM    [place], [place], [place]  [address] Phone: [phone]; Fax: [phone]

## 2016-07-11 ENCOUNTER — Encounter: Payer: Self-pay | Admitting: Internal Medicine

## 2016-07-11 ENCOUNTER — Ambulatory Visit (INDEPENDENT_AMBULATORY_CARE_PROVIDER_SITE_OTHER): Payer: Self-pay | Admitting: Internal Medicine

## 2016-07-11 ENCOUNTER — Encounter (INDEPENDENT_AMBULATORY_CARE_PROVIDER_SITE_OTHER): Payer: Self-pay

## 2016-07-11 VITALS — BP 132/68 | HR 65 | Ht 68.5 in | Wt 214.1 lb

## 2016-07-11 DIAGNOSIS — I10 Essential (primary) hypertension: Secondary | ICD-10-CM

## 2016-07-11 DIAGNOSIS — I25810 Atherosclerosis of coronary artery bypass graft(s) without angina pectoris: Secondary | ICD-10-CM

## 2016-07-11 NOTE — Patient Instructions (Signed)
Your physician recommends that you continue on your current medications as directed. Please refer to the Current Medication list given to you today. Your physician wants you to follow-up in: MAY, 2018 [place].  You will receive a reminder letter in the mail two months in advance. If you don't receive a letter, please call our office to schedule the follow-up appointment.

## 2016-07-21 ENCOUNTER — Other Ambulatory Visit: Payer: Self-pay | Admitting: Physician Assistant

## 2016-07-21 DIAGNOSIS — Z8673 Personal history of transient ischemic attack (TIA), and cerebral infarction without residual deficits: Secondary | ICD-10-CM

## 2016-07-21 DIAGNOSIS — I4891 Unspecified atrial fibrillation: Secondary | ICD-10-CM

## 2016-08-11 ENCOUNTER — Encounter: Payer: Self-pay | Admitting: Neurology

## 2016-08-25 ENCOUNTER — Other Ambulatory Visit: Payer: Self-pay | Admitting: Internal Medicine

## 2016-09-14 ENCOUNTER — Ambulatory Visit: Payer: Self-pay | Admitting: Neurology

## 2016-09-30 ENCOUNTER — Telehealth: Payer: Self-pay | Admitting: Neurology

## 2016-09-30 NOTE — Telephone Encounter (Signed)
I spoke with the patient today who stated that she does not have any active insurance. She has a botox apt scheduled on Wednesday 02/14 and she will be self pay. She is using 100 units of botox. What would she be required to pay the day of apt?

## 2016-09-30 NOTE — Telephone Encounter (Signed)
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  She used BOTOX 100 units by patient assistant program last time.  Check on her status, if needed she may come in for 50 units of xeomin injection as needed, not necessarily every 3 months, may stretch much longer.

## 2016-10-03 NOTE — Telephone Encounter (Signed)
I called the patient to inform her of the total she will owe for her next injection. She did not answer so I left a VM asking for her to call me back. After speaking with Angie in billing, she made me aware that the patient owes a balance with our office of over 700 dollars. She will have to make a payment on this balance before she can come in for her next injection.

## 2016-10-05 ENCOUNTER — Ambulatory Visit: Payer: Self-pay | Admitting: Neurology

## 2016-11-10 ENCOUNTER — Telehealth: Payer: Self-pay | Admitting: Neurology

## 2016-11-10 NOTE — Telephone Encounter (Signed)
Pt would like a call to reschedule her botox,

## 2016-11-30 ENCOUNTER — Encounter (INDEPENDENT_AMBULATORY_CARE_PROVIDER_SITE_OTHER): Payer: Self-pay

## 2016-11-30 ENCOUNTER — Encounter: Payer: Self-pay | Admitting: Neurology

## 2016-11-30 ENCOUNTER — Ambulatory Visit (INDEPENDENT_AMBULATORY_CARE_PROVIDER_SITE_OTHER): Payer: Self-pay | Admitting: Neurology

## 2016-11-30 ENCOUNTER — Telehealth: Payer: Self-pay | Admitting: Neurology

## 2016-11-30 VITALS — BP 212/85 | HR 74 | Ht 68.5 in | Wt 214.8 lb

## 2016-11-30 DIAGNOSIS — G5139 Clonic hemifacial spasm, unspecified: Secondary | ICD-10-CM

## 2016-11-30 DIAGNOSIS — G513 Clonic hemifacial spasm: Secondary | ICD-10-CM

## 2016-11-30 MED ORDER — INCOBOTULINUMTOXINA 50 UNITS IM SOLR
50.0000 [IU] | INTRAMUSCULAR | Status: AC
  Administered 2016-11-30: 50 [IU] via INTRAMUSCULAR

## 2016-11-30 NOTE — Telephone Encounter (Signed)
°  Return in 3 mo, for emg guided xeomin injection

## 2016-11-30 NOTE — Progress Notes (Signed)
**  Xeomin 50 units, [place] 0259-1605-01, Lot [id number], Exp 02/2018, office supply.//mck,rn**

## 2016-11-30 NOTE — Progress Notes (Signed)
PATIENT: Ashley [name] DOB: 1966/03/31  Chief Complaint  Patient presents with  . Hemifacial Spasms    Botox 100 units - office supply     [place] the 51 years old right-handed female, seen in refer by my colleague Xu for evaluation of EMG guided botulism toxin injection for her right hemifacial spasm  May 19 2016    she had a history of coronary artery disease, status post CABG, mitral valve regurgitation status post mitral valve repair, dilated cardiomyopathy with previous ejection fraction 35-40% improved to normal now, previous history of stroke at age 70 while taking contraceptives and smoke, she quit  contraceptives since then, she presented with left hemiparesis, require prolonged physical therapy, was treated by Dr. love in the past,   She was admitted to [facility] in [place], [name] 03/05/16-03/08/16 for a new stroke  stroke and was treated with tPA. At the time, she was shopping in the store, felt some right arm tingling and then her daughter told her she has some facial droop. After sitting down, she was not able to talk for about 10 minutes. Symptoms gradually getting better in ER. Brain MRI demonstrated acute infarct within the left arietal white matter.  Echo EF 50% and bilateral carotid ultrasound no significant ICA stenosis.  She was discharged on Atorvastatin 80, Carvedilol 12.5 bid, Lisinopril 10 QD, Plavix 75 QD.  Since DC, she has been doing well.   She notes increasing bruising due to ASA+Plavix. Followed with cardiology and 30 day cardiac event monitoring ordered due to risk of  atrial fibrillation  She reported a history of gradual onset right facial twitching around her right eye since open heart surgery in 2013, gradually getting worse, triggered by stress, fatigue, today she had a prolonged episode of right hemifacial spasm during interview, to the point of blocking her right vision, she stated that her right hemifacial spasm was  present 25% of her daytime, no significant pain, sometimes affecting her driving because his cover her right eye for couple minutes, she denied right visual loss, no hearing loss, no history of right Bell's palsy.   UPDATE Jun 15 2016: This is her first EMG guided botulism toxin injection for her spastic right hemifacial spasm, potential side effect was explained to her.  UPDATE November 30 2016: She responded very well to previous and her first EMG guided botulism toxin a injection of June 15 2016, no significant side effect notice, on the recent few weeks she noticed recurrent right facial twitching,  She is self-pay, not qualified for patient assistant program, we will use xeomin 50 units for this injection  REVIEW OF SYSTEMS: Full 14 system review of systems performed and notable only for as above   ALLERGIES: Allergies  Allergen Reactions  . Azithromycin Nausea Only  . Latex     rash    HOME MEDICATIONS: Current Outpatient Prescriptions  Medication Sig Dispense Refill  . aspirin EC 81 MG tablet Take 1 tablet (81 mg total) by mouth daily.    [name] atorvastatin (LIPITOR) 40 MG tablet Take 1 tablet (40 mg total) by mouth daily at 6 PM. 30 tablet 10  . botulinum toxin Type A (BOTOX) 100 units SOLR injection Inject 100 Units into the muscle every 3 (three) months.    . carvedilol (COREG) 12.5 MG tablet take 1 and 1/2 tablet by mouth twice a day (MAY TAKE 1 EXTRA AS NEEDED) 95 tablet 5  . clopidogrel (PLAVIX) 75 MG tablet  Take 1 tablet (75 mg total) by mouth daily. 60 tablet 11  . famotidine (PEPCID) 20 MG tablet Take 20 mg by mouth daily as needed. For indigestion    . lisinopril (PRINIVIL,ZESTRIL) 20 MG tablet Take 1 tablet (20 mg total) by mouth daily. 90 tablet 3  . vitamin B-12 (CYANOCOBALAMIN) 1000 MCG tablet Take 1 tablet (1,000 mcg total) by mouth daily.    . vitamin C (ASCORBIC ACID) 500 MG tablet Take 500 mg by mouth 2 (two) times daily.     No current facility-administered  medications for this visit.     PAST MEDICAL HISTORY: Past Medical History:  Diagnosis Date  . Abdominal pain    ? chronic cholecystitis; admxn to [facility] 9/12 (CT, USN, HIDA done)  . Cardiomyopathy    echo 9/12: mild LVH, EF 35-40%, mod MR (difficult to judge /post leaflet restricted), mod BAE, mod RVE, mod TR   . Cardiomyopathy ([place])    a. echo 9/12: mild LVH, EF 35-40%, mod MR (difficult to judge /post leaflet restricted), mod BAE, mod RVE, mod TR. b. F/u echo 11/2011: mild LVH, EF 55-60%, s/p MV repair without significant MR/MS, mildly dilated RV/RA, PASP 55mmHg.   . Carotid artery disease ([place])    a. Duplex 06/2015: 50% RECA, 1-39% BICA.  [name] Coronary artery disease    a. s/p CABG 10/2011 - LIMA-LAD, SVG-PDA, SVG-OM, SVG-diagonal.  . Dyslipidemia   . Essential hypertension   . Hemifacial spasm   . History of Doppler ultrasound    a. Carotid US [facility] in [place], [name]) 7/17: no hemodynamically significant ICA stenosis  . Mitral regurgitation    a. s/p MV repair 10/2011 - on Coumadin for 3 months afterwards then d/c'd by surgery.  . Recurrent upper respiratory infection (URI)   . 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 Stroke [facility])    a. age 58 - in setting of cigs and OCPs. //  b. s/p acute L parietal CVA >> tPA - [facility] in [place], [name]  . Tobacco abuse     PAST SURGICAL HISTORY: Past Surgical History:  Procedure Laterality Date  . CARDIAC CATHETERIZATION    . CESAREAN SECTION    . cesarean section    . CORONARY ARTERY BYPASS GRAFT  10/21/2011   Procedure: CORONARY ARTERY BYPASS GRAFTING (CABG);  Surgeon: [name], MD;  Location: [place];  Service: Open Heart Surgery;  Laterality: N/A;  cabg x four, using right leg greater saphenous vein harvested endoscopically  . DENTAL SURGERY    . MITRAL VALVE REPAIR  10/21/2011   Procedure: MITRAL VALVE REPAIR (MVR);  Surgeon: [name], MD;  Location: [place];  Service: Open Heart  Surgery;  Laterality: N/A;  . TEE WITHOUT CARDIOVERSION  06/30/2011   Procedure: TRANSESOPHAGEAL ECHOCARDIOGRAM (TEE);  Surgeon: [name], MD;  Location[facility] ENDOSCOPY;  Service: Cardiovascular;  Laterality: N/A;  . TONSILLECTOMY    . TUBAL LIGATION      FAMILY HISTORY: Family History  Problem Relation Age of Onset  . Hypertension Mother   . Stroke Mother   . Heart disease Father   . COPD Father   . Anesthesia problems Neg Hx   . Hypotension Neg Hx   . Malignant hyperthermia Neg Hx   . Pseudochol deficiency Neg Hx     SOCIAL HISTORY:  Social History   Social History  . Marital status: Married    Spouse  name: N/A  . Number of children: 65  . Years of education: 2   Occupational History  . 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   Social History Main Topics  . Smoking status: Former Smoker    Packs/day: 1.00    Years: 30.00    Quit date: 05/07/2011  . Smokeless tobacco: Never Used  . Alcohol use 0.0 oz/week     Comment: 1 glass of red wine nightly  . Drug use: No  . Sexual activity: Yes   Other Topics Concern  . Not on file   Social History Narrative   Lives at home with husband and son.   Right-handed.   2 cups caffeine daily.        PHYSICAL EXAM   Vitals:   11/30/16 1504  BP: (!) 212/85  Pulse: 74  Weight: 214 lb 12 oz (97.4 kg)  Height: 5' 8.5" (1.74 m)    Not recorded      Body mass index is 32.18 kg/m.  PHYSICAL EXAMNIATION:  Gen: NAD, conversant, well nourised, obese, well groomed                     Cardiovascular: Regular rate rhythm, no peripheral edema, warm, nontender. Eyes: Conjunctivae clear without exudates or hemorrhage Neck: Supple, no carotid bruise. Pulmonary: Clear to auscultation bilaterally   NEUROLOGICAL EXAM:  MENTAL STATUS: Speech:    Speech is normal; fluent and spontaneous with normal comprehension.  Cognition:     Orientation to time, place and person     Normal recent and remote  memory     Normal Attention span and concentration     Normal Language, naming, repeating,spontaneous speech     Fund of knowledge   CRANIAL NERVES: SHE HAD ONE EPISODE OF PROLONGED RIGHT HEMIFACIAL MUSCLE SPASM, MAINLY INVOLVING RIGHT ORBICULARIS ORIS OCULI, TO THE POINT OF COVERING HER RIGHT EYE, BUT IN BETWEEN MUSCLE SPASM, SHE HAD NO SIGNIFICANT ASYMMETRY OR WEAKNESS OF RIGHT EYE  CN II: Visual fields are full to confrontation. Fundoscopic exam is normal with sharp discs and no vascular changes. Pupils are round equal and briskly reactive to light. CN III, IV, VI: extraocular movement are normal. No ptosis. CN V: Facial sensation is intact to pinprick in all 3 divisions bilaterally. Corneal responses are intact.  CN VII: Face is symmetric with normal eye closure and smile. CN VIII: Hearing is normal to rubbing fingers CN IX, X: Palate elevates symmetrically. Phonation is normal. CN XI: Head turning and shoulder shrug are intact CN XII: Tongue is midline with normal movements and no atrophy.  MOTOR: Fixation of left upper extremity upon rapid rotating movement,   REFLEXES: Reflexes are 2+ and symmetric at the biceps, triceps, knees, and ankles. Plantar responses are flexor.  SENSORY: Intact to light touch, pinprick, positional sensation and vibratory sensation are intact in fingers and toes.  COORDINATION: Rapid alternating movements and fine finger movements are intact. There is no dysmetria on finger-to-nose and heel-knee-shin.    GAIT/STANCE: Posture is normal. Gait is steady with normal steps, base, arm swing, and turning. Heel and toe walking are normal. Tandem gait is normal.  Romberg is absent.   DIAGNOSTIC DATA (LABS, IMAGING, TESTING) - I reviewed patient records, labs, notes, testing and imaging myself where available.   ASSESSMENT AND PLAN  [place] is a 51 y.o. female   Right hemifacial spasm     This is her first injection on June 15 2016  Under EMG  guidance, used xeomin 50 units/1 cc of normal saline (patient is self pay )  Right orbicularis oculi at 2, 4, 6, 7, 8, 9: 2.5 units eachx 6= 15 units total Right corrugate 5 units Left corrugate 5 units   Right frontalis 5 units times 2=10  units Left orbicularis oculi at 3, 4:00, 2.5 units x2=5 units  Right zygomatic major 5 units   right nasalis 5 units  Stroke;  Vascular risk factor of coronary artery disease, status post mitral valve replacement, stroke, hyperlipidemia, tobacco abuse,   Marcial Pacas, M.D. Ph.D.  [facility] [name], [place], [place] [address] Ph: [phone] Fax: [phone]  CC: Referring Provider

## 2016-12-02 NOTE — Telephone Encounter (Signed)
I called to schedule the patient for her next injection. She did not answer so I left a VM asking her to call me back.  °

## 2016-12-20 ENCOUNTER — Encounter: Payer: Self-pay | Admitting: Neurology

## 2016-12-20 ENCOUNTER — Ambulatory Visit (INDEPENDENT_AMBULATORY_CARE_PROVIDER_SITE_OTHER): Payer: Self-pay | Admitting: Neurology

## 2016-12-20 VITALS — BP 165/72 | HR 63 | Ht 68.5 in | Wt 217.0 lb

## 2016-12-20 DIAGNOSIS — G513 Clonic hemifacial spasm: Secondary | ICD-10-CM

## 2016-12-20 DIAGNOSIS — Z951 Presence of aortocoronary bypass graft: Secondary | ICD-10-CM

## 2016-12-20 DIAGNOSIS — D6861 Antiphospholipid syndrome: Secondary | ICD-10-CM

## 2016-12-20 DIAGNOSIS — I6340 Cerebral infarction due to embolism of unspecified cerebral artery: Secondary | ICD-10-CM

## 2016-12-20 DIAGNOSIS — E538 Deficiency of other specified B group vitamins: Secondary | ICD-10-CM

## 2016-12-20 DIAGNOSIS — Z9889 Other specified postprocedural states: Secondary | ICD-10-CM

## 2016-12-20 DIAGNOSIS — E785 Hyperlipidemia, unspecified: Secondary | ICD-10-CM

## 2016-12-20 DIAGNOSIS — G5139 Clonic hemifacial spasm, unspecified: Secondary | ICD-10-CM

## 2016-12-20 NOTE — Patient Instructions (Signed)
-   continue ASA and plavix and lipitor for stroke prevention - repeat beta 2 glycoprotein IgG  - Follow up with your primary care physician for stroke risk factor modification. Recommend maintain blood pressure goal <130/80, diabetes with hemoglobin A1c goal below 7.0% and lipids with LDL cholesterol goal below 70 mg/dL.  - continue follow up with cardiology - continue botox for hemifacial spasm - check BP at home and record - continue abstain from smoking - healthy diet and regular exercise. - continue B12 supplement. - follow up with patient assistance program - follow up in 6 months.

## 2016-12-20 NOTE — Progress Notes (Signed)
NEUROLOGY CLINIC FOLLOW UP NOTE  NAME: Ashley [name] DOB: Apr 15, 1966  HPI: Ashley [name] is a 51 y.o. 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   Pt had a stroke when she was 51 yo in the setting of OCP and smoking. She quit OCP since then. At that time, she woke up in the morning, could not talk at all with left arm weakness without leg weakness. She had PT/OT for 6 months with residual of slurry speech and left hand dexterity difficulty when she got tired or sick. She had followed with [name] at [facility] in the past.  She was admitted to [facility] in [place], [name] 03/05/16-03/08/16 for a stroke and treated with tPA. At the time, she was shopping in the store, felt some right arm tingling and then her daughter told her she has some facial droop. After sitting down, she was not able to talk for about 10 minutes. Symptoms gradually getting better in ER. Brain MRI demonstrated acute infarct within the L parietal white matter.  Echo EF 50% and CUS no significant ICA stenosis.  She was DC on Atorvastatin 80, Carvedilol 12.5 bid, Lisinopril 10 QD, Plavix 75 QD.  Since DC, she has been doing well.  She still has some speech difficulty which she thinks is chronic from previous stroke. She notes increasing bruising due to ASA+Plavix. Followed with cardiology and 30 day cardiac event monitoring ordered due to risk of afib.   She also has frequent right facial twitching and eye blinking since the heart surgery 4 years ago. It happens every day, worse with stress and fatigue. 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 LHC in 05/2011 demonstrated three-vessel CAD. TEE demonstrated severe mitral regurgitation, no  PFO. She had CABG and MV repair by Dr. [name] on 10/21/11.   History of HTN, on Coreg and lisinopril. At home blood pressure 140-145, today BP 175/78. Patient complains of white coat syndrome.  History of HLD, on Lipitor 40 at home. LDL 06/2015 was 91 and 07/2015 was 67.  She was smoker and quit smoking last month after the second stroke. Social alcohol, denies any illicit drug use.  06/21/17 follow up - she has been doing well without stroke like symptoms. Currently she has 30 day cardiac event monitoring ongoing. She has botox injection for hemifacial spasm and right face looks better. 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 Her B12 low and currently on B12 supplement. Continued no smoking. BP today in clinic 163/79, but stated anxious in clinic.  Interval history: During the interval time, pt has been doing the same. No recurrent stroke like symptoms. Had not repeat beta 2 glycoprotein yet. Had botox for hemifacial spasm with Dr. Krista Blue and much better. Only once a while, she will have facial twitching on the right. 30 day cardiac event monitoring negative. Continued no smoking. On B12, ASA and plavix and lipitor. BP 165/72. But stated at home her BP 135-145.    Past Medical History:  Diagnosis Date  . Abdominal pain    ? chronic cholecystitis; admxn to [facility] 9/12 (CT, USN, HIDA done)  . Cardiomyopathy    echo 9/12: mild LVH, EF 35-40%, mod MR (difficult to judge /post leaflet restricted), mod BAE, mod RVE, mod TR   . Cardiomyopathy ([place])    a. echo 9/12: mild LVH, EF 35-40%, mod MR (difficult to judge /post leaflet restricted), mod BAE, mod RVE, mod TR. b. F/u echo 11/2011: mild LVH, EF 55-60%, s/p MV repair without significant MR/MS, mildly dilated RV/RA, PASP  78mmHg.   . Carotid artery disease ([place])    a. Duplex 06/2015: 50% RECA, 1-39% BICA.  [name] Coronary artery disease    a. s/p CABG 10/2011 - LIMA-LAD, SVG-PDA, SVG-OM, SVG-diagonal.  . Dyslipidemia   . Essential hypertension   . Hemifacial spasm   . History of Doppler ultrasound    a. Carotid US [facility] in [place], [name]) 7/17: no hemodynamically significant ICA stenosis  . Mitral regurgitation    a. s/p MV repair 10/2011 - on Coumadin for 3 months afterwards then d/c'd by surgery.  . Recurrent upper respiratory infection (URI)   . 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 Stroke [facility])    a. age 62 - in setting of cigs and OCPs. //  b. s/p acute L parietal CVA >> tPA - [facility] in [place], [name]  . Tobacco abuse    Past Surgical History:  Procedure Laterality Date  . CARDIAC CATHETERIZATION    . CESAREAN SECTION    . cesarean section    . CORONARY ARTERY BYPASS GRAFT  10/21/2011   Procedure: CORONARY ARTERY BYPASS GRAFTING (CABG);  Surgeon: [name], MD;  Location: [place];  Service: Open Heart Surgery;  Laterality: N/A;  cabg x four, using right leg greater saphenous vein harvested endoscopically  . DENTAL SURGERY    . MITRAL VALVE REPAIR  10/21/2011   Procedure: MITRAL VALVE REPAIR (MVR);  Surgeon: [name], MD;  Location: [place];  Service: Open Heart Surgery;  Laterality: N/A;  . TEE WITHOUT CARDIOVERSION  06/30/2011   Procedure: TRANSESOPHAGEAL ECHOCARDIOGRAM (TEE);  Surgeon: [name], MD;  Location: [facility] ENDOSCOPY;  Service: Cardiovascular;  Laterality: N/A;  . TONSILLECTOMY    . TUBAL LIGATION     Family History  Problem Relation Age of Onset  . Hypertension Mother   . Stroke Mother   . Heart disease Father   . COPD Father   . Anesthesia problems Neg Hx   . Hypotension Neg Hx   . Malignant hyperthermia Neg Hx   . Pseudochol deficiency Neg Hx    Current Outpatient Prescriptions  Medication Sig Dispense Refill  . aspirin EC  81 MG tablet Take 1 tablet (81 mg total) by mouth daily.    [name] atorvastatin (LIPITOR) 40 MG tablet Take 1 tablet (40 mg total) by mouth daily at 6 PM. 30 tablet 10  . botulinum toxin Type A (BOTOX) 100 units SOLR injection Inject 100 Units into the muscle every 3 (three) months.    . carvedilol (COREG) 12.5 MG tablet take 1 and 1/2 tablet by mouth twice a day (MAY TAKE 1 EXTRA AS NEEDED) 95 tablet 5  . clopidogrel (PLAVIX) 75 MG tablet Take 1 tablet (75 mg total) by mouth daily. 60 tablet 11  . famotidine (PEPCID) 20 MG tablet Take 20 mg by mouth daily as needed. For indigestion    .  lisinopril (PRINIVIL,ZESTRIL) 20 MG tablet Take 1 tablet (20 mg total) by mouth daily. 90 tablet 3  . vitamin B-12 (CYANOCOBALAMIN) 1000 MCG tablet Take 1 tablet (1,000 mcg total) by mouth daily.    . vitamin C (ASCORBIC ACID) 500 MG tablet Take 500 mg by mouth 2 (two) times daily.     Current Facility-Administered Medications  Medication Dose Route Frequency Provider Last Rate Last Dose  . incobotulinumtoxinA (XEOMIN) 50 units injection 50 Units  50 Units Intramuscular Q90 days Marcial Pacas, MD   50 Units at 11/30/16 1546   Allergies  Allergen Reactions  . Azithromycin Nausea Only  . Latex     rash   Social History   Social History  . Marital status: Married    Spouse name: N/A  . Number of children: 44  . Years of education: 2   Occupational History  . 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   Social History Main Topics  . Smoking status: Former Smoker    Packs/day: 1.00    Years: 30.00    Quit date: 05/07/2011  . Smokeless tobacco: Never Used  . Alcohol use 0.0 oz/week     Comment: 1 glass of red wine nightly  . Drug use: No  . Sexual activity: Yes   Other Topics Concern  . Not on file   Social History Narrative   Lives at home with husband and son.   Right-handed.   2 cups caffeine daily.       Review of Systems Full 14 system review of systems performed and notable  only for those listed, all others are neg:  Constitutional:   Cardiovascular:  Ear/Nose/Throat:   Skin:  Eyes:   Respiratory:   Gastroitestinal:   Genitourinary:  Hematology/Lymphatic:   Endocrine:  Musculoskeletal:   Allergy/Immunology: latex allergy Neurological:   Psychiatric:  Sleep:    Physical Exam  Vitals:   12/20/16 1123  BP: (!) 165/72  Pulse: 63    General - obese, well developed, in no apparent distress,.  Ophthalmologic - Fundi not visualized due to mild hemifacial spasm.  Cardiovascular - Regular rate and rhythm. Carotid pulses were 2+ without bruits .   Neck - supple, no nuchal rigidity .  Mental Status -  Level of arousal and orientation to time, place, and person were intact. Language including expression, naming, repetition, comprehension, reading, and writing was assessed and found intact. Occasional stuttering due to anxiety and stress Attention span and concentration were normal. Recent and remote memory were intact. Fund of Knowledge was assessed and was intact.  Cranial Nerves II - XII - II - Visual field intact OU. III, IV, VI - Extraocular movements intact. V - Facial sensation intact bilaterally. VII - Facial movement intact bilaterally, no right facial twitching and eye blinking seen this time. Right mild facial droop after botox treatment.  VIII - Hearing & vestibular intact bilaterally. X - Palate elevates symmetrically. XI - Chin turning & shoulder shrug intact bilaterally. XII - Tongue protrusion intact.  Motor Strength - The patient's strength was normal in all extremities except left hand mild dexterity difficulties and pronator drift was absent.  Bulk was normal and fasciculations were absent.   Motor Tone - Muscle tone was assessed at the neck and appendages and was normal.  Reflexes - The patient's reflexes were normal in all extremities and she had no pathological reflexes.  Sensory - Light touch, temperature/pinprick were  assessed and were normal.  Coordination - The patient had normal movements in the hands and feet with no ataxia or dysmetria.  Tremor was absent.  Gait and Station - The patient's transfers, posture, gait, station, and turns were observed as normal.   Imaging  Brain imaging not available for review. However, as per note:  CT head x 2 - no acute abnormalities.  MRI brain - acute infarct within the L parietal white matter.    TTE - EF 50%   CUS - no significant ICA stenosis.   Mra Head Wo Contrast 05/05/2016 1. There is mild flow signal irregularity in the inferior basilar artery without significant stenosis. 2. The right P1 segment of posterior cerebral artery has decreased flow signal may be due to atherosclerosis resulting in mild to moderate stenosis. 3. Remaining medium-large sized vessels are unremarkable.  Mr Kizzie Fantasia Contrast 05/05/2016 1. Chronic ischemic infarction with encephalomalacia and gliosis in the right right fronto-parietal peri-rolandic region (3.6x3.9cm). 2. Additional smaller chronic ischemic infarctions in the bilateral parietal cortical, left occipital cortical, left fronto-parietal subcortical, and bilateral cerebellar regions. 3. Small right parietal chronic cerebral microhemorrhage. 4. No acute findings.   30 day cardiac event monitoring - no afib   Lab Review  Component     Latest Ref Rng & Units 06/23/2015 08/05/2015  Total Bilirubin     0.2 - 1.2 mg/dL  0.9  Bilirubin, Direct     <=0.2 mg/dL  0.2  Indirect Bilirubin     0.2 - 1.2 mg/dL  0.7  Alkaline Phosphatase     33 - 130 U/L  95  AST     10 - 35 U/L  19  ALT     6 - 29 U/L  19  Total Protein     6.1 - 8.1 g/dL  7.1  Albumin     3.6 - 5.1 g/dL  4.1  Cholesterol     125 - 200 mg/dL 157 146  Triglycerides     <150 mg/dL 107 201 (H)  HDL Cholesterol     >=46 mg/dL 45 (L) 39 (L)  Total CHOL/HDL Ratio     <=5.0 Ratio 3.5 3.7  VLDL     <30 mg/dL 21 40 (H)  LDL (calc)     <130  mg/dL 91 67  Hemoglobin A1C     <5.7 % 5.4   Mean Plasma Glucose     <117 mg/dL 108   TSH     0.350 - 4.500 uIU/mL 1.904    Component     Latest Ref Rng & Units 04/12/2016  Prothrombin Time     sec 11.0  INR     ratio 1.1  APTT     sec 29.1  APTT 1:1 NP     sec CANCELED  APTT 1:1 Saline     Sec CANCELED  THROMBIN TIME     sec 17.2  DRVVT Screen Seconds     sec 47.2 (H)  DRVVT Confirm Seconds     sec 34.7  DRVVT Ratio     ratio 1.3 (H)  Hexagonal Phospholipid Neutral     sec 9  Platelet Neutralization     sec 0.0  Anticardiolipin Ab, IgG     GPL 16  Anticardiolipin Ab, IgM     MPL <10  Beta-2 Glycoprotein I, IgG     SGU 93 (H)  Beta-2 Glycoprotein I, IgM     SMU <10  Beta-2 Glycoprotein I, IgA     SAU 14  LAC Interpretation      Comment  ENA RNP Ab     0.0 - 0.9 AI 0.4  ENA SM Ab Ser-aCnc     0.0 - 0.9 AI <0.2  RA Latex Turbid.     0.0 - 13.9 IU/mL <10.0  Chromatin Ab SerPl-aCnc     0.0 - 0.9 AI <0.2  ENA SSA (RO) Ab     0.0 - 0.9 AI <0.2  ENA SSB (LA) Ab     0.0 - 0.9 AI <0.2  dsDNA Ab     0 - 9 IU/mL 3  Beta-2 Glycoprotein I Ab, IgG     0 - 20 GPI IgG units 74 (H)  Beta-2 Glyco 1 IgM     0 - 32 GPI IgM units <9  Anticardiolipin Ab,IgG,Qn     0 - 14 GPL U/mL 13  Anticardiolipin Ab,IgM,Qn     0 - 12 MPL U/mL <9  Hemoglobin A1C     4.8 - 5.6 % 5.5  Est. average glucose Bld gHb Est-mCnc     mg/dL 111  TSH     0.450 - 4.500 uIU/mL 1.550  T4,Free(Direct)     0.82 - 1.77 ng/dL 1.06  Homocysteine     0.0 - 15.0 umol/L 20.5 (H)  RPR     Non Reactive Non Reactive  Vitamin B12     211 - 946 pg/mL 184 (L)  HIV     Non Reactive Non Reactive     Assessment:   In summary, Ashley [name] is a 51 y.o. 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 Echo EF 50% and CUS no significant ICA stenosis. She was DC on Atorvastatin 80, Carvedilol 12.5 bid, Lisinopril 10 QD, Plavix 75 QD.  Since DC, she has been doing well.  She still has some speech difficulty which she thinks is chronic from previous stroke. She is on ASA+Plavix now, will continue at this time. Followed with cardiology and 30 day cardiac event monitoring ongoing now. She had TEE in 2012 showed no PFO, MRI in 04/2016 showed multiple bilateral old anterior and posterior circulation infarcts indicting embolic events. MRA unremarkable. B12 was low and put on B12 supplement. Hypercoagulable showed mild elevated homocystenine level (20.5) but high titer of beta 2 glycoprotein IgG. Autoimmune work up negative. She had one pregnancy with twin but almost premature at 6w, she was put on bed rest for 14 weeks before giving birth. She had tube ligation after. Need to repeat beta 2 glycoprotein IgG. Had botox treatment for hemifacial spasm with Dr. Krista Blue, doing well, much improved.   Plan: - continue ASA and plavix and lipitor for stroke prevention - repeat beta 2 glycoprotein IgG  - Follow up with your primary care physician for stroke risk factor modification. Recommend maintain blood pressure goal <130/80, diabetes with hemoglobin A1c goal below 7.0% and lipids with LDL cholesterol goal below 70 mg/dL.  - continue follow up with cardiology - continue botox for hemifacial spasm - check BP at home and record - continue abstain from smoking - healthy diet and regular exercise. - continue B12 supplement. - follow up with patient assistance program - follow up in 6 months.   I spent more than  25 minutes of face to face time with the patient. Greater than 50% of time was spent in counseling and coordination of care. We discussed about antiphospholipid syndrome, its diagnosis and management, as well as continued botox for semifacial spasm.     Orders Placed This Encounter    Procedures  . Beta-2 Glycoprotein I Ab, IgG    No orders of the defined types were placed in this encounter.   Patient Instructions  - continue ASA and plavix and lipitor for stroke prevention - repeat beta 2 glycoprotein IgG  - Follow up with your primary care physician for stroke risk factor modification. Recommend maintain blood pressure goal <130/80, diabetes with hemoglobin A1c goal below 7.0% and lipids with LDL cholesterol goal below 70 mg/dL.  - continue follow up with cardiology - continue botox for hemifacial spasm - check BP at home and record - continue abstain from smoking - healthy diet and regular exercise. - continue B12 supplement. - follow up with patient assistance program - follow up in 6 months.    Rosalin Hawking, MD PhD [facility] [address], [place], [place] [address] [phone]

## 2016-12-22 ENCOUNTER — Telehealth: Payer: Self-pay

## 2016-12-22 LAB — BETA-2 GLYCOPROTEIN I AB, IGG: Beta-2 Glyco I IgG: <9 GPI IgG units (ref 0–20)

## 2016-12-22 NOTE — Telephone Encounter (Signed)
-----   Message from Rosalin Hawking, MD sent at 12/22/2016  9:36 AM EDT ----- Could you please let the patient know that the blood test done recently in our office was negative this time. No need for coumadin. This is good news. Please continue current treatment. Thanks.  Rosalin Hawking, MD PhD Stroke Neurology 12/22/2016 9:36 AM

## 2016-12-22 NOTE — Telephone Encounter (Signed)
Rn call patient that her blood test was negative at this time. NO need for coumadin. Continue treatment plan. Pt was happy, and verbalized understanding.

## 2016-12-29 ENCOUNTER — Other Ambulatory Visit: Payer: Self-pay | Admitting: Internal Medicine

## 2017-03-17 ENCOUNTER — Other Ambulatory Visit: Payer: Self-pay | Admitting: Physician Assistant

## 2017-04-30 ENCOUNTER — Other Ambulatory Visit: Payer: Self-pay | Admitting: Physician Assistant

## 2017-06-11 ENCOUNTER — Other Ambulatory Visit: Payer: Self-pay | Admitting: Internal Medicine

## 2017-07-03 ENCOUNTER — Encounter (INDEPENDENT_AMBULATORY_CARE_PROVIDER_SITE_OTHER): Payer: Self-pay

## 2017-07-03 ENCOUNTER — Encounter: Payer: Self-pay | Admitting: Neurology

## 2017-07-03 ENCOUNTER — Telehealth: Payer: Self-pay | Admitting: Neurology

## 2017-07-03 ENCOUNTER — Ambulatory Visit: Payer: Self-pay | Admitting: Neurology

## 2017-07-03 VITALS — BP 195/85 | HR 69 | Wt 214.0 lb

## 2017-07-03 DIAGNOSIS — Z9889 Other specified postprocedural states: Secondary | ICD-10-CM

## 2017-07-03 DIAGNOSIS — Z87891 Personal history of nicotine dependence: Secondary | ICD-10-CM | POA: Insufficient documentation

## 2017-07-03 DIAGNOSIS — I6330 Cerebral infarction due to thrombosis of unspecified cerebral artery: Secondary | ICD-10-CM

## 2017-07-03 DIAGNOSIS — I10 Essential (primary) hypertension: Secondary | ICD-10-CM

## 2017-07-03 DIAGNOSIS — G5139 Clonic hemifacial spasm, unspecified: Secondary | ICD-10-CM

## 2017-07-03 DIAGNOSIS — Z951 Presence of aortocoronary bypass graft: Secondary | ICD-10-CM

## 2017-07-03 DIAGNOSIS — E538 Deficiency of other specified B group vitamins: Secondary | ICD-10-CM

## 2017-07-03 NOTE — Telephone Encounter (Signed)
Pt states she is suppose to be called to schedule a BOTOX appt. Can you please look into this and schedule if needed. Thank you JBA

## 2017-07-03 NOTE — Patient Instructions (Signed)
-   continue ASA and plavix and lipitor for stroke and cardiac prevention - Follow up with your primary care physician for stroke risk factor modification. Recommend maintain blood pressure goal <130/80, diabetes with hemoglobin A1c goal below 7.0% and lipids with LDL cholesterol goal below 70 mg/dL.  - continue follow up with cardiology - make appointment about botox for hemifacial spasm - check BP at home and record. - continue abstain from smoking. - healthy diet and regular exercise. - continue B12 supplement. - continue work on the patient assistance program - follow up in 6 months.

## 2017-07-03 NOTE — Progress Notes (Signed)
NEUROLOGY CLINIC FOLLOW UP NOTE  NAME: Ashley [name] DOB: 1965/08/27  HPI: Ashley [name] is a 51 y.o. 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   Pt had a stroke when she was 51 yo in the setting of OCP and smoking. She quit OCP since then. At that time, she woke up in the morning, could not talk at all with left arm weakness without leg weakness. She had PT/OT for 6 months with residual of slurry speech and left hand dexterity difficulty when she got tired or sick. She had followed with [name] at [facility] in the past.  She was admitted to [facility] in [place], [name] 03/05/16-03/08/16 for a stroke and treated with tPA. At the time, she was shopping in the store, felt some right arm tingling and then her daughter told her she has some facial droop. After sitting down, she was not able to talk for about 10 minutes. Symptoms gradually getting better in ER. Brain MRI demonstrated acute infarct within the L parietal white matter.  Echo EF 50% and CUS no significant ICA stenosis.  She was DC on Atorvastatin 80, Carvedilol 12.5 bid, Lisinopril 10 QD, Plavix 75 QD.  Since DC, she has been doing well.  She still has some speech difficulty which she thinks is chronic from previous stroke. She notes increasing bruising due to ASA+Plavix. Followed with cardiology and 30 day cardiac event monitoring ordered due to risk of afib.   She also has frequent right facial twitching and eye blinking since the heart surgery 4 years ago. It happens every day, worse with stress and fatigue. 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 LHC in 05/2011 demonstrated three-vessel CAD. TEE demonstrated severe mitral regurgitation, no  PFO. She had CABG and MV repair by Dr. [name] on 10/21/11.   History of HTN, on Coreg and lisinopril. At home blood pressure 140-145, today BP 175/78. Patient complains of white coat syndrome.  History of HLD, on Lipitor 40 at home. LDL 06/2015 was 91 and 07/2015 was 67.  She was smoker and quit smoking last month after the second stroke. Social alcohol, denies any illicit drug use.  06/21/16 follow up - she has been doing well without stroke like symptoms. Currently she has 30 day cardiac event monitoring ongoing. She has botox injection for hemifacial spasm and right face looks better. 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 Her B12 low and currently on B12 supplement. Continued no smoking. BP today in clinic 163/79, but stated anxious in clinic.  12/20/16 follow up - pt has been doing the same. No recurrent stroke like symptoms. Had not repeat beta 2 glycoprotein yet. Had botox for hemifacial spasm with Dr. Krista Blue and much better. Only once a while, she will have facial twitching on the right. 30 day cardiac event monitoring negative. Continued no smoking. On B12, ASA and plavix and lipitor. BP 165/72. But stated at home her BP 135-145.  Interval history: During the interval time, pt  has been doing well. Had botox 6 months ago for right hemifacial spasm. It lasted until 2 weeks ago and she felt her hemifacial spasm started to acting up. She is yet to make an appointment with Dr. Krista Blue for the botox. She is also going to follow up with Cardiologist Dr. Harrington Challenger soon. Her BP at home 135-145/75 but today in clinic 195/85 and she said she was anxious due to white coat syndrome and her sister is in hospital to check for cancer. Otherwise, no complains.    Past Medical History:    Diagnosis Date  . Abdominal pain    ? chronic cholecystitis; admxn to [facility] 9/12 (CT, USN, HIDA done)  . Cardiomyopathy    echo 9/12: mild LVH, EF 35-40%, mod MR (difficult to judge /post leaflet restricted), mod BAE, mod RVE, mod TR   . Cardiomyopathy ([place])    a. echo 9/12: mild LVH, EF 35-40%, mod MR (difficult to judge /post leaflet restricted), mod BAE, mod RVE, mod TR. b. F/u echo 11/2011: mild LVH, EF 55-60%, s/p MV repair without significant MR/MS, mildly dilated RV/RA, PASP 58mmHg.   . Carotid artery disease ([place])    a. Duplex 06/2015: 50% RECA, 1-39% BICA.  [name] Coronary artery disease    a. s/p CABG 10/2011 - LIMA-LAD, SVG-PDA, SVG-OM, SVG-diagonal.  . Dyslipidemia   . Essential hypertension   . Hemifacial spasm   . History of Doppler ultrasound    a. Carotid US [facility] in [place], [name]) 7/17: no hemodynamically significant ICA stenosis  . Mitral regurgitation    a. s/p MV repair 10/2011 - on Coumadin for 3 months afterwards then d/c'd by surgery.  . Recurrent upper respiratory infection (URI)   . 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 Stroke [facility])    a. age 45 - in setting of cigs and OCPs. //  b. s/p acute L parietal CVA >> tPA - [facility] in [place], [name]  . Tobacco abuse    Past Surgical History:  Procedure Laterality Date  . CARDIAC CATHETERIZATION    . CESAREAN SECTION    . cesarean section    . DENTAL SURGERY    . TONSILLECTOMY    . TUBAL LIGATION     Family History  Problem Relation Age of Onset  . Hypertension Mother   . Stroke Mother   . Heart disease Father   . COPD Father   . Anesthesia problems Neg Hx   . Hypotension Neg Hx   . Malignant hyperthermia Neg Hx   . Pseudochol deficiency Neg Hx    Current Outpatient Medications  Medication Sig Dispense Refill  . aspirin EC 81 MG tablet Take 1 tablet (81 mg total) by mouth daily.    [name] atorvastatin (LIPITOR) 40 MG tablet Take 1 tablet (40 mg total) by mouth daily  at 6 PM. 30 tablet 10  . botulinum toxin Type A (BOTOX) 100 units SOLR injection Inject 100 Units into the muscle every 3 (three) months.    . carvedilol (COREG) 12.5 MG tablet take 1 and 1/2 tablet by mouth twice a day MAY TAKE 1 EXTRA TABLET AS NEEDED 95 tablet 5  . clopidogrel (PLAVIX) 75 MG tablet take 1 tablet by mouth once daily 60 tablet 9  . famotidine (PEPCID) 20 MG tablet Take 20 mg by mouth daily as needed. For indigestion    . lisinopril (PRINIVIL,ZESTRIL) 20 MG tablet Take 1 tablet (20 mg  total) by mouth daily. Please make an appt with Dr. Harrington Challenger for November before anymore refills. Thanks 30 tablet 0  . vitamin B-12 (CYANOCOBALAMIN) 1000 MCG tablet Take 1 tablet (1,000 mcg total) by mouth daily.    . vitamin C (ASCORBIC ACID) 500 MG tablet Take 500 mg by mouth 2 (two) times daily.     Current Facility-Administered Medications  Medication Dose Route Frequency Provider Last Rate Last Dose  . incobotulinumtoxinA (XEOMIN) 50 units injection 50 Units  50 Units Intramuscular Q90 days Marcial Pacas, MD   50 Units at 11/30/16 1546   Allergies  Allergen Reactions  . Azithromycin Nausea Only  . Latex     rash   Social History   Socioeconomic History  . Marital status: Married    Spouse name: Not on file  . Number of children: 98  . Years of education: 2  . Highest education level: Not on file  Social Needs  . Financial resource strain: Not on file  . Food insecurity - worry: Not on file  . Food insecurity - inability: Not on file  . Transportation needs - medical: Not on file  . Transportation needs - non-medical: Not on file  Occupational History  . 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  Tobacco Use  . Smoking status: Former Smoker    Packs/day: 1.00    Years: 30.00    Pack years: 30.00    Last attempt to quit: 05/07/2011    Years since quitting: 6.1  . Smokeless tobacco: Never Used  Substance and Sexual Activity  . Alcohol use: Yes     Alcohol/week: 0.6 oz    Types: 1 Glasses of wine per week    Comment: 1 glass of red wine nightly  . Drug use: No  . Sexual activity: Yes  Other Topics Concern  . Not on file  Social History Narrative   Lives at home with husband and son.   Right-handed.   2 cups caffeine daily.    Review of Systems Full 14 system review of systems performed and notable only for those listed, all others are neg:  Constitutional:   Cardiovascular:  Ear/Nose/Throat:   Skin:  Eyes:   Respiratory:   Gastroitestinal:   Genitourinary:  Hematology/Lymphatic:   Endocrine:  Musculoskeletal:   Allergy/Immunology: latex allergy Neurological:   Psychiatric:  Sleep:    Physical Exam  Vitals:   07/03/17 1119  BP: (!) 195/85  Pulse: 69    General - obese, well developed, in no apparent distress,.  Ophthalmologic - Fundi not visualized due to mild hemifacial spasm.  Cardiovascular - Regular rate and rhythm. Carotid pulses were 2+ without bruits .   Neck - supple, no nuchal rigidity .  Mental Status -  Level of arousal and orientation to time, place, and person were intact. Language including expression, naming, repetition, comprehension, reading, and writing was assessed and found intact. Occasional stuttering due to anxiety and stress Attention span and concentration were normal. Recent and remote memory were intact. Fund of Knowledge was assessed and was intact.  Cranial Nerves II - XII - II - Visual field intact OU. III, IV, VI - Extraocular movements intact. V - Facial sensation intact bilaterally. VII - Facial movement intact bilaterally, no right facial twitching and eye blinking seen this time. Right mild facial droop after botox treatment.  VIII - Hearing & vestibular intact bilaterally. X - Palate elevates symmetrically. XI - Chin turning & shoulder shrug intact  bilaterally. XII - Tongue protrusion intact.  Motor Strength - The patient's strength was normal in all  extremities except left hand mild dexterity difficulties and pronator drift was absent.  Bulk was normal and fasciculations were absent.   Motor Tone - Muscle tone was assessed at the neck and appendages and was normal.  Reflexes - The patient's reflexes were normal in all extremities and she had no pathological reflexes.  Sensory - Light touch, temperature/pinprick were assessed and were normal.    Coordination - The patient had normal movements in the hands and feet with no ataxia or dysmetria.  Tremor was absent.  Gait and Station - The patient's transfers, posture, gait, station, and turns were observed as normal.   Imaging  Brain imaging not available for review. However, as per note:  CT head x 2 - no acute abnormalities.  MRI brain - acute infarct within the L parietal white matter.    TTE - EF 50%   CUS - no significant ICA stenosis.   Mra Head Wo Contrast 05/05/2016 1. There is mild flow signal irregularity in the inferior basilar artery without significant stenosis. 2. The right P1 segment of posterior cerebral artery has decreased flow signal may be due to atherosclerosis resulting in mild to moderate stenosis. 3. Remaining medium-large sized vessels are unremarkable.  Mr Kizzie Fantasia Contrast 05/05/2016 1. Chronic ischemic infarction with encephalomalacia and gliosis in the right right fronto-parietal peri-rolandic region (3.6x3.9cm). 2. Additional smaller chronic ischemic infarctions in the bilateral parietal cortical, left occipital cortical, left fronto-parietal subcortical, and bilateral cerebellar regions. 3. Small right parietal chronic cerebral microhemorrhage. 4. No acute findings.   30 day cardiac event monitoring - no afib   Lab Review  Component     Latest Ref Rng & Units 06/23/2015 08/05/2015  Total Bilirubin     0.2 - 1.2 mg/dL  0.9  Bilirubin, Direct     <=0.2 mg/dL  0.2  Indirect Bilirubin     0.2 - 1.2 mg/dL  0.7  Alkaline Phosphatase     33 - 130  U/L  95  AST     10 - 35 U/L  19  ALT     6 - 29 U/L  19  Total Protein     6.1 - 8.1 g/dL  7.1  Albumin     3.6 - 5.1 g/dL  4.1  Cholesterol     125 - 200 mg/dL 157 146  Triglycerides     <150 mg/dL 107 201 (H)  HDL Cholesterol     >=46 mg/dL 45 (L) 39 (L)  Total CHOL/HDL Ratio     <=5.0 Ratio 3.5 3.7  VLDL     <30 mg/dL 21 40 (H)  LDL (calc)     <130 mg/dL 91 67  Hemoglobin A1C     <5.7 % 5.4   Mean Plasma Glucose     <117 mg/dL 108   TSH     0.350 - 4.500 uIU/mL 1.904    Component     Latest Ref Rng & Units 04/12/2016  Prothrombin Time     sec 11.0  INR     ratio 1.1  APTT     sec 29.1  APTT 1:1 NP     sec CANCELED  APTT 1:1 Saline     Sec CANCELED  THROMBIN TIME     sec 17.2  DRVVT Screen Seconds     sec 47.2 (H)  DRVVT Confirm Seconds  sec 34.7  DRVVT Ratio     ratio 1.3 (H)  Hexagonal Phospholipid Neutral     sec 9  Platelet Neutralization     sec 0.0  Anticardiolipin Ab, IgG     GPL 16  Anticardiolipin Ab, IgM     MPL <10  Beta-2 Glycoprotein I, IgG     SGU 93 (H)  Beta-2 Glycoprotein I, IgM     SMU <10  Beta-2 Glycoprotein I, IgA     SAU 14  LAC Interpretation      Comment  ENA RNP Ab     0.0 - 0.9 AI 0.4  ENA SM Ab Ser-aCnc     0.0 - 0.9 AI <0.2  RA Latex Turbid.     0.0 - 13.9 IU/mL <10.0  Chromatin Ab SerPl-aCnc     0.0 - 0.9 AI <0.2  ENA SSA (RO) Ab     0.0 - 0.9 AI <0.2  ENA SSB (LA) Ab     0.0 - 0.9 AI <0.2  dsDNA Ab     0 - 9 IU/mL 3  Beta-2 Glycoprotein I Ab, IgG     0 - 20 GPI IgG units 74 (H)  Beta-2 Glyco 1 IgM     0 - 32 GPI IgM units <9  Anticardiolipin Ab,IgG,Qn     0 - 14 GPL U/mL 13  Anticardiolipin Ab,IgM,Qn     0 - 12 MPL U/mL <9  Hemoglobin A1C     4.8 - 5.6 % 5.5  Est. average glucose Bld gHb Est-mCnc     mg/dL 111  TSH     0.450 - 4.500 uIU/mL 1.550  T4,Free(Direct)     0.82 - 1.77 ng/dL 1.06  Homocysteine     0.0 - 15.0 umol/L 20.5 (H)  RPR     Non Reactive Non Reactive  Vitamin B12      211 - 946 pg/mL 184 (L)  HIV     Non Reactive Non Reactive     Assessment:   In summary, Ashley [name] is a 51 y.o. 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 Echo EF 50% and CUS no significant ICA stenosis. She was DC on Atorvastatin 80, Carvedilol 12.5 bid, Lisinopril 10 QD, Plavix 75 QD.  Since DC, she has been doing well.  She still has some speech difficulty which she thinks is chronic from previous stroke. She is on ASA+Plavix now, will continue at this time. Followed with cardiology and 30 day cardiac event monitoring ongoing now. She had TEE in 2012 showed no PFO, MRI in 04/2016 showed multiple old bilateral anterior and posterior circulation infarcts indicting embolic events. MRA unremarkable. B12 was low and put on B12 supplement. Hypercoagulable showed mild elevated homocystenine level (20.5) but high titer of beta 2 glycoprotein IgG. Autoimmune work up negative. She had one pregnancy with twin but almost premature at 2w, she was put on bed rest for 14 weeks before giving birth. She had tube ligation after. Repeat beta 2 glycoprotein IgG was normal. Had botox treatment for hemifacial spasm with Dr. Krista Blue, last injection lasted 6 months now, but need new injection soon.   Plan: - continue ASA and plavix and lipitor for stroke and cardiac prevention -  Follow up with your primary care physician for stroke risk factor modification. Recommend maintain blood pressure goal <130/80, diabetes with hemoglobin A1c goal below 7.0% and lipids with LDL cholesterol goal below 70 mg/dL.  - continue follow up with cardiology - make appointment about botox for hemifacial spasm - check BP at home and record. - continue abstain from smoking. - healthy diet and regular exercise. -  continue B12 supplement. - continue work on the patient assistance program - follow up in 6 months.  No orders of the defined types were placed in this encounter.   No orders of the defined types were placed in this encounter.   Patient Instructions  - continue ASA and plavix and lipitor for stroke and cardiac prevention - Follow up with your primary care physician for stroke risk factor modification. Recommend maintain blood pressure goal <130/80, diabetes with hemoglobin A1c goal below 7.0% and lipids with LDL cholesterol goal below 70 mg/dL.  - continue follow up with cardiology - make appointment about botox for hemifacial spasm - check BP at home and record. - continue abstain from smoking. - healthy diet and regular exercise. - continue B12 supplement. - continue work on the patient assistance program - follow up in 6 months.   Rosalin Hawking, MD PhD [facility] [address], [place], [place] [address] [phone]

## 2017-07-04 NOTE — Telephone Encounter (Signed)
I called and scheduled patient.  

## 2017-07-05 ENCOUNTER — Other Ambulatory Visit: Payer: Self-pay | Admitting: *Deleted

## 2017-07-05 MED ORDER — CARVEDILOL 12.5 MG PO TABS
ORAL_TABLET | ORAL | 0 refills | Status: DC
Start: 2017-07-05 — End: 2017-07-27

## 2017-07-17 ENCOUNTER — Other Ambulatory Visit: Payer: Self-pay | Admitting: *Deleted

## 2017-07-17 MED ORDER — LISINOPRIL 20 MG PO TABS: 20.0000 mg | ORAL_TABLET | Freq: Every day | ORAL | 0 refills | Status: DC

## 2017-07-25 ENCOUNTER — Telehealth: Payer: Self-pay | Admitting: Neurology

## 2017-07-25 ENCOUNTER — Ambulatory Visit: Payer: Self-pay | Admitting: Neurology

## 2017-07-25 ENCOUNTER — Encounter: Payer: Self-pay | Admitting: Neurology

## 2017-07-25 VITALS — BP 182/87 | HR 70 | Ht 68.5 in | Wt 215.8 lb

## 2017-07-25 DIAGNOSIS — G5139 Clonic hemifacial spasm, unspecified: Secondary | ICD-10-CM

## 2017-07-25 MED ORDER — INCOBOTULINUMTOXINA 50 UNITS IM SOLR
50.0000 [IU] | INTRAMUSCULAR | Status: AC
  Administered 2017-07-25: 50 [IU] via INTRAMUSCULAR

## 2017-07-25 NOTE — Progress Notes (Signed)
PATIENT: Ashley [name] DOB: [birth year]  Chief Complaint  Patient presents with  . Hemifacial Spasm    Xeomin 50 units x 1 vial - office supply (pt is self-pay)     [place] the 51 years old right-handed female, seen in refer by my colleague Xu for evaluation of EMG guided botulism toxin injection for her right hemifacial spasm  May 19 2016    she had a history of coronary artery disease, status post CABG, mitral valve regurgitation status post mitral valve repair, dilated cardiomyopathy with previous ejection fraction 35-40% improved to normal now, previous history of stroke at age 3 while taking contraceptives and smoke, she quit  contraceptives since then, she presented with left hemiparesis, require prolonged physical therapy, was treated by Dr. love in the past,   She was admitted to [facility] in [place], [name] 03/05/16-03/08/16 for a new stroke  stroke and was treated with tPA. At the time, she was shopping in the store, felt some right arm tingling and then her daughter told her she has some facial droop. After sitting down, she was not able to talk for about 10 minutes. Symptoms gradually getting better in ER. Brain MRI demonstrated acute infarct within the left arietal white matter.  Echo EF 50% and bilateral carotid ultrasound no significant ICA stenosis.  She was discharged on Atorvastatin 80, Carvedilol 12.5 bid, Lisinopril 10 QD, Plavix 75 QD.  Since DC, she has been doing well.   She notes increasing bruising due to ASA+Plavix. Followed with cardiology and 30 day cardiac event monitoring ordered due to risk of  atrial fibrillation  She reported a history of gradual onset right facial twitching around her right eye since open heart surgery in 2013, gradually getting worse, triggered by stress, fatigue, today she had a prolonged episode of right hemifacial spasm during interview, to the point of blocking her right vision, she stated that her  right hemifacial spasm was present 25% of her daytime, no significant pain, sometimes affecting her driving because his cover her right eye for couple minutes, she denied right visual loss, no hearing loss, no history of right Bell's palsy.   UPDATE Jun 15 2016: This is her first EMG guided botulism toxin injection for her spastic right hemifacial spasm, potential side effect was explained to her.  UPDATE November 30 2016: She responded very well to previous and her first EMG guided botulism toxin a injection of June 15 2016, no significant side effect notice, on the recent few weeks she noticed recurrent right facial twitching,  She is self-pay, not qualified for patient assistant program, we will use xeomin 50 units for this injection  UPDATE Jul 25 2017: She responded well to previous injection April 2018, only in recent weeks, right facial spasm returned  REVIEW OF SYSTEMS: Full 14 system review of systems performed and notable only for as above   ALLERGIES: Allergies  Allergen Reactions  . Azithromycin Nausea Only  . Latex     rash    HOME MEDICATIONS: Current Outpatient Medications  Medication Sig Dispense Refill  . aspirin EC 81 MG tablet Take 1 tablet (81 mg total) by mouth daily.    [name] atorvastatin (LIPITOR) 40 MG tablet Take 1 tablet (40 mg total) by mouth daily at 6 PM. 30 tablet 10  . botulinum toxin Type A (BOTOX) 100 units SOLR injection Inject 100 Units into the muscle every 3 (three) months.    . carvedilol (COREG) 12.5 MG  tablet take 1 and 1/2 tablet by mouth twice a day MAY TAKE 1 EXTRA TABLET BY MOUTH AS NEEDED 95 tablet 0  . clopidogrel (PLAVIX) 75 MG tablet take 1 tablet by mouth once daily 60 tablet 9  . famotidine (PEPCID) 20 MG tablet Take 20 mg by mouth daily as needed. For indigestion    . lisinopril (PRINIVIL,ZESTRIL) 20 MG tablet Take 1 tablet (20 mg total) by mouth daily. Please keep upcoming appt with Dr. Harrington Challenger for  future refills. Thanks 30 tablet 0  .  vitamin B-12 (CYANOCOBALAMIN) 1000 MCG tablet Take 1 tablet (1,000 mcg total) by mouth daily.    . vitamin C (ASCORBIC ACID) 500 MG tablet Take 500 mg by mouth 2 (two) times daily.     Current Facility-Administered Medications  Medication Dose Route Frequency Provider Last Rate Last Dose  . incobotulinumtoxinA (XEOMIN) 50 units injection 50 Units  50 Units Intramuscular Q90 days Marcial Pacas, MD   50 Units at 11/30/16 1546    PAST MEDICAL HISTORY: Past Medical History:  Diagnosis Date  . Abdominal pain    ? chronic cholecystitis; admxn to [facility] 9/12 (CT, USN, HIDA done)  . Cardiomyopathy    echo 9/12: mild LVH, EF 35-40%, mod MR (difficult to judge /post leaflet restricted), mod BAE, mod RVE, mod TR   . Cardiomyopathy ([place])    a. echo 9/12: mild LVH, EF 35-40%, mod MR (difficult to judge /post leaflet restricted), mod BAE, mod RVE, mod TR. b. F/u echo 11/2011: mild LVH, EF 55-60%, s/p MV repair without significant MR/MS, mildly dilated RV/RA, PASP 73mmHg.   . Carotid artery disease ([place])    a. Duplex 06/2015: 50% RECA, 1-39% BICA.  [name] Coronary artery disease    a. s/p CABG 10/2011 - LIMA-LAD, SVG-PDA, SVG-OM, SVG-diagonal.  . Dyslipidemia   . Essential hypertension   . Hemifacial spasm   . History of Doppler ultrasound    a. Carotid US [facility] in [place], [name]) 7/17: no hemodynamically significant ICA stenosis  . Mitral regurgitation    a. s/p MV repair 10/2011 - on Coumadin for 3 months afterwards then d/c'd by surgery.  . Recurrent upper respiratory infection (URI)   . 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 Stroke [facility])    a. age 56 - in setting of cigs and OCPs. //  b. s/p acute L parietal CVA >> tPA - [facility] in [place], [name]  . Tobacco abuse     PAST SURGICAL HISTORY: Past Surgical History:  Procedure Laterality Date  . CARDIAC CATHETERIZATION    . CESAREAN SECTION    . cesarean section    . CORONARY ARTERY BYPASS GRAFT  10/21/2011     Procedure: CORONARY ARTERY BYPASS GRAFTING (CABG);  Surgeon: [name], MD;  Location: [place];  Service: Open Heart Surgery;  Laterality: N/A;  cabg x four, using right leg greater saphenous vein harvested endoscopically  . DENTAL SURGERY    . MITRAL VALVE REPAIR  10/21/2011   Procedure: MITRAL VALVE REPAIR (MVR);  Surgeon: [name], MD;  Location: [place];  Service: Open Heart Surgery;  Laterality: N/A;  . TEE WITHOUT CARDIOVERSION  06/30/2011   Procedure: TRANSESOPHAGEAL ECHOCARDIOGRAM (TEE);  Surgeon: [name], MD;  Location: [facility] ENDOSCOPY;  Service: Cardiovascular;  Laterality: N/A;  . TONSILLECTOMY    . TUBAL LIGATION      FAMILY HISTORY: Family History  Problem Relation Age  of Onset  . Hypertension Mother   . Stroke Mother   . Heart disease Father   . COPD Father   . Anesthesia problems Neg Hx   . Hypotension Neg Hx   . Malignant hyperthermia Neg Hx   . Pseudochol deficiency Neg Hx     SOCIAL HISTORY:  Social History   Socioeconomic History  . Marital status: Married    Spouse name: Not on file  . Number of children: 24  . Years of education: 2  . Highest education level: Not on file  Social Needs  . Financial resource strain: Not on file  . Food insecurity - worry: Not on file  . Food insecurity - inability: Not on file  . Transportation needs - medical: Not on file  . Transportation needs - non-medical: Not on file  Occupational History  . 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  Tobacco Use  . Smoking status: Former Smoker    Packs/day: 1.00    Years: 30.00    Pack years: 30.00    Last attempt to quit: 05/07/2011    Years since quitting: 6.2  . Smokeless tobacco: Never Used  Substance and Sexual Activity  . Alcohol use: Yes    Alcohol/week: 0.6 oz    Types: 1 Glasses of wine per week    Comment: 1 glass of red wine nightly  . Drug use: No  . Sexual activity: Yes  Other Topics Concern  . Not on file   Social History Narrative   Lives at home with husband and son.   Right-handed.   2 cups caffeine daily.     PHYSICAL EXAM   Vitals:   07/25/17 0817  BP: (!) 182/87  Pulse: 70  Weight: 215 lb 12.8 oz (97.9 kg)  Height: 5' 8.5" (1.74 m)    Not recorded      Body mass index is 32.34 kg/m.  PHYSICAL EXAMNIATION:  Gen: NAD, conversant, well nourised, obese, well groomed                     Cardiovascular: Regular rate rhythm, no peripheral edema, warm, nontender. Eyes: Conjunctivae clear without exudates or hemorrhage Neck: Supple, no carotid bruise. Pulmonary: Clear to auscultation bilaterally   NEUROLOGICAL EXAM:  MENTAL STATUS: Speech:    Speech is normal; fluent and spontaneous with normal comprehension.  Cognition:     Orientation to time, place and person     Normal recent and remote memory     Normal Attention span and concentration     Normal Language, naming, repeating,spontaneous speech     Fund of knowledge   CRANIAL NERVES: SHE HAD ONE EPISODE OF PROLONGED RIGHT HEMIFACIAL MUSCLE SPASM, MAINLY INVOLVING RIGHT ORBICULARIS ORIS OCULI, TO THE POINT OF COVERING HER RIGHT EYE, BUT IN BETWEEN MUSCLE SPASM, SHE HAD NO SIGNIFICANT ASYMMETRY OR WEAKNESS OF RIGHT EYE  CN II: Visual fields are full to confrontation. Fundoscopic exam is normal with sharp discs and no vascular changes. Pupils are round equal and briskly reactive to light. CN III, IV, VI: extraocular movement are normal. No ptosis. CN V: Facial sensation is intact to pinprick in all 3 divisions bilaterally. Corneal responses are intact.  CN VII: Face is symmetric with normal eye closure and smile. CN VIII: Hearing is normal to rubbing fingers CN IX, X: Palate elevates symmetrically. Phonation is normal. CN XI: Head turning and shoulder shrug are intact CN XII: Tongue is midline with normal  movements and no atrophy.  MOTOR: Fixation of left upper extremity upon rapid rotating movement,    REFLEXES: Reflexes are 2+ and symmetric at the biceps, triceps, knees, and ankles. Plantar responses are flexor.  SENSORY: Intact to light touch, pinprick, positional sensation and vibratory sensation are intact in fingers and toes.  COORDINATION: Rapid alternating movements and fine finger movements are intact. There is no dysmetria on finger-to-nose and heel-knee-shin.    GAIT/STANCE: Posture is normal. Gait is steady with normal steps, base, arm swing, and turning. Heel and toe walking are normal. Tandem gait is normal.  Romberg is absent.   DIAGNOSTIC DATA (LABS, IMAGING, TESTING) - I reviewed patient records, labs, notes, testing and imaging myself where available.   ASSESSMENT AND PLAN  Naila Breese is a 50 y.o. female   Right hemifacial spasm   Under EMG guidance, used xeomin 50 units/1 cc of normal saline (patient is self pay )  Right orbicularis oculi at 2, 4, 6, 7, 8, 9: 2.5 units eachx 6= 15 units total Right corrugate 5 units Left corrugate 5 units  Procerus 5 units  Right frontalis 5 units times 2=10  units Left orbicularis oculi at 3, 4:00, 2.5 units x2=5 units Left frontalis 5 units  Stroke;  Vascular risk factor of coronary artery disease, status post mitral valve replacement, stroke, hyperlipidemia, tobacco abuse,   Marcial Pacas, M.D. Ph.D.  [facility] [address], [place], [place] [address] Ph: [phone] Fax: [phone]  CC: Referring Provider

## 2017-07-25 NOTE — Progress Notes (Signed)
**  Xeomin 50 units x 1 vial, NDC 0259-1605-01, Lot [id number], Exp 11/2018, office supply, pt is self-pay.//mck**

## 2017-07-25 NOTE — Telephone Encounter (Signed)
Pt. Needs Inj. Scheduled.

## 2017-07-26 MED ORDER — INCOBOTULINUMTOXINA 50 UNITS IM SOLR
50.0000 [IU] | INTRAMUSCULAR | Status: AC
  Administered 2017-07-26: 50 [IU] via INTRAMUSCULAR

## 2017-07-26 NOTE — Addendum Note (Signed)
Addended by: Marcial Pacas on: 07/26/2017 12:20 PM   Modules accepted: Orders

## 2017-07-27 ENCOUNTER — Encounter: Payer: Self-pay | Admitting: Internal Medicine

## 2017-07-27 ENCOUNTER — Ambulatory Visit (INDEPENDENT_AMBULATORY_CARE_PROVIDER_SITE_OTHER): Payer: Self-pay | Admitting: Internal Medicine

## 2017-07-27 VITALS — BP 144/90 | HR 67 | Ht 68.5 in | Wt 215.0 lb

## 2017-07-27 DIAGNOSIS — I25810 Atherosclerosis of coronary artery bypass graft(s) without angina pectoris: Secondary | ICD-10-CM

## 2017-07-27 DIAGNOSIS — I10 Essential (primary) hypertension: Secondary | ICD-10-CM

## 2017-07-27 DIAGNOSIS — E785 Hyperlipidemia, unspecified: Secondary | ICD-10-CM

## 2017-07-27 LAB — BASIC METABOLIC PANEL
BUN / CREAT RATIO: 10 (ref 9–23)
BUN: 9 mg/dL (ref 6–24)
CO2: 23 mmol/L (ref 20–29)
CREATININE: 0.92 mg/dL (ref 0.57–1.00)
Calcium: 9.3 mg/dL (ref 8.7–10.2)
Chloride: 103 mmol/L (ref 96–106)
GFR calc Af Amer: 83 mL/min/{1.73_m2} (ref >59)
GFR calc non Af Amer: 72 mL/min/{1.73_m2} (ref >59)
GLUCOSE: 104 mg/dL — AB (ref 65–99)
POTASSIUM: 4.6 mmol/L (ref 3.5–5.2)
SODIUM: 140 mmol/L (ref 134–144)

## 2017-07-27 LAB — CBC
HEMOGLOBIN: 12.1 g/dL (ref 11.1–15.9)
Hematocrit: 34.4 % (ref 34.0–46.6)
MCH: 33 pg (ref 26.6–33.0)
MCHC: 35.2 g/dL (ref 31.5–35.7)
MCV: 94 fL (ref 79–97)
PLATELETS: 239 10*3/uL (ref 150–379)
RBC: 3.67 x10E6/uL — ABNORMAL LOW (ref 3.77–5.28)
RDW: 13.1 % (ref 12.3–15.4)
WBC: 6.7 10*3/uL (ref 3.4–10.8)

## 2017-07-27 LAB — LIPID PANEL
CHOLESTEROL TOTAL: 143 mg/dL (ref 100–199)
Chol/HDL Ratio: 3.4 ratio (ref 0.0–4.4)
HDL: 42 mg/dL (ref >39)
LDL CALC: 71 mg/dL (ref 0–99)
Triglycerides: 150 mg/dL — ABNORMAL HIGH (ref 0–149)
VLDL Cholesterol Cal: 30 mg/dL (ref 5–40)

## 2017-07-27 MED ORDER — CARVEDILOL 12.5 MG PO TABS: ORAL_TABLET | ORAL | 3 refills | Status: DC

## 2017-07-27 MED ORDER — AMLODIPINE BESYLATE 2.5 MG PO TABS: 2.5000 mg | ORAL_TABLET | Freq: Every day | ORAL | 3 refills | Status: DC

## 2017-07-27 MED ORDER — ATORVASTATIN CALCIUM 40 MG PO TABS: 40.0000 mg | ORAL_TABLET | Freq: Every day | ORAL | 3 refills | Status: DC

## 2017-07-27 MED ORDER — LISINOPRIL 20 MG PO TABS: 20.0000 mg | ORAL_TABLET | Freq: Every day | ORAL | 3 refills | Status: DC

## 2017-07-27 NOTE — Patient Instructions (Signed)
Your physician has recommended you make the following change in your medication:  1.) start amlodipine 2.5 mg once a day for blood pressure  Your physician recommends that you return for lab work today (BMET, CBC, [place])  Your physician recommends that you schedule a follow-up appointment in: 2 [place].

## 2017-07-27 NOTE — Progress Notes (Signed)
Cardiology Office Note   Date:  07/27/2017   ID:  Ashley [name], DOB 1966/07/08, MRN [id number]  PCP:  Patient, No Pcp Per  Cardiologist:   Dorris Carnes, MD   F/U of CAD     History of Present Illness: Ashley [name] is a 50 y.o. female with a history of CAD (s/p CABG 2012), MVP with severe MR (s/p repair)  Dilated CM (LVEF normalized), HTN, HL and tobac use.  She was seen by D Dunn in 2016  Suffeered a CVA in 2017 July.  Rx with TPA  LVEF 50% She was seen by Kathleen Argue in Aug 2017    I saw her in November 2017    Breathing is OK   No palpiations    No CP    Outpatient Medications Prior to Visit  Medication Sig Dispense Refill  . aspirin EC 81 MG tablet Take 1 tablet (81 mg total) by mouth daily.    [name] atorvastatin (LIPITOR) 40 MG tablet Take 1 tablet (40 mg total) by mouth daily at 6 PM. 30 tablet 10  . botulinum toxin Type A (BOTOX) 100 units SOLR injection Inject 100 Units into the muscle every 3 (three) months.    . carvedilol (COREG) 12.5 MG tablet take 1 and 1/2 tablet by mouth twice a day MAY TAKE 1 EXTRA TABLET BY MOUTH AS NEEDED 95 tablet 0  . clopidogrel (PLAVIX) 75 MG tablet take 1 tablet by mouth once daily 60 tablet 9  . famotidine (PEPCID) 20 MG tablet Take 20 mg by mouth daily as needed. For indigestion    . lisinopril (PRINIVIL,ZESTRIL) 20 MG tablet Take 1 tablet (20 mg total) by mouth daily. Please keep upcoming appt with Dr. Harrington Challenger for  future refills. Thanks 30 tablet 0  . vitamin B-12 (CYANOCOBALAMIN) 1000 MCG tablet Take 1 tablet (1,000 mcg total) by mouth daily.    . vitamin C (ASCORBIC ACID) 500 MG tablet Take 500 mg by mouth 2 (two) times daily.     Facility-Administered Medications Prior to Visit  Medication Dose Route Frequency Provider Last Rate Last Dose  . incobotulinumtoxinA (XEOMIN) 50 units injection 50 Units  50 Units Intramuscular Q90 days Marcial Pacas, MD   50 Units at 11/30/16 1546  . incobotulinumtoxinA (XEOMIN) 50 units injection 50 Units  50 Units  Intramuscular Q90 days Marcial Pacas, MD   50 Units at 07/25/17 [id number]  . incobotulinumtoxinA (XEOMIN) 50 units injection 50 Units  50 Units Intramuscular Q90 days Marcial Pacas, MD   50 Units at 07/26/17 1217     Allergies:   Azithromycin and Latex   Past Medical History:  Diagnosis Date  . Abdominal pain    ? chronic cholecystitis; admxn to [facility] 9/12 (CT, USN, HIDA done)  . Cardiomyopathy    echo 9/12: mild LVH, EF 35-40%, mod MR (difficult to judge /post leaflet restricted), mod BAE, mod RVE, mod TR   . Cardiomyopathy ([place])    a. echo 9/12: mild LVH, EF 35-40%, mod MR (difficult to judge /post leaflet restricted), mod BAE, mod RVE, mod TR. b. F/u echo 11/2011: mild LVH, EF 55-60%, s/p MV repair without significant MR/MS, mildly dilated RV/RA, PASP 62mmHg.   . Carotid artery disease ([place])    a. Duplex 06/2015: 50% RECA, 1-39% BICA.  [name] Coronary artery disease    a. s/p CABG 10/2011 - LIMA-LAD, SVG-PDA, SVG-OM, SVG-diagonal.  . Dyslipidemia   . Essential hypertension   . Hemifacial spasm   . History of Doppler  ultrasound    a. Carotid US [facility] in [place], [name]) 7/17: no hemodynamically significant ICA stenosis  . Mitral regurgitation    a. s/p MV repair 10/2011 - on Coumadin for 3 months afterwards then d/c'd by surgery.  . Recurrent upper respiratory infection (URI)   . 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 Stroke [facility])    a. age 89 - in setting of cigs and OCPs. //  b. s/p acute L parietal CVA >> tPA - [facility] in [place], [name]  . Tobacco abuse     Past Surgical History:  Procedure Laterality Date  . CARDIAC CATHETERIZATION    . CESAREAN SECTION    . cesarean section    . CORONARY ARTERY BYPASS GRAFT  10/21/2011   Procedure: CORONARY ARTERY BYPASS GRAFTING (CABG);  Surgeon: [name], MD;  Location: [place];  Service: Open Heart Surgery;  Laterality: N/A;  cabg x four, using right leg greater saphenous vein harvested endoscopically  .  DENTAL SURGERY    . MITRAL VALVE REPAIR  10/21/2011   Procedure: MITRAL VALVE REPAIR (MVR);  Surgeon: [name], MD;  Location: [place];  Service: Open Heart Surgery;  Laterality: N/A;  . TEE WITHOUT CARDIOVERSION  06/30/2011   Procedure: TRANSESOPHAGEAL ECHOCARDIOGRAM (TEE);  Surgeon: [name], MD;  Location: [facility] ENDOSCOPY;  Service: Cardiovascular;  Laterality: N/A;  . TONSILLECTOMY    . TUBAL LIGATION       Social History:  The patient  reports that she quit smoking about 6 years ago. She has a 30.00 pack-year smoking history. she has never used smokeless tobacco. She reports that she drinks about 0.6 oz of alcohol per week. She reports that she does not use drugs.   Family History:  The patient's family history includes COPD in her father; Heart disease in her father; Hypertension in her mother; Stroke in her mother.    ROS:  Please see the history of present illness. All other systems are reviewed and  Negative to the above problem except as noted.    PHYSICAL EXAM: VS:  BP (!) 144/90   Pulse 67   Ht 5' 8.5" (1.74 m)   Wt 215 lb (97.5 kg)   BMI 32.22 kg/m   GEN: Well nourished, well developed, in no acute distress  HEENT: normal  Neck: no JVD, carotid bruits, or masses Cardiac: RRR; no murmurs, rubs, or gallops,no edema  Respiratory:  clear to auscultation bilaterally, normal work of breathing GI: soft, nontender, nondistended, + BS  No hepatomegaly  MS: no deformity Moving all extremities   Skin: warm and dry, no rash Neuro:  Strength and sensation are intact Psych: euthymic mood, full affect   EKG:  EKG is ordered today.  SR 67 bpm   NOnspecific ST changes     Lipid Panel    Component Value Date/Time   CHOL 130 07/01/2016 0939   TRIG 123 07/01/2016 0939   HDL 34 (L) 07/01/2016 0939   CHOLHDL 3.8 07/01/2016 0939   VLDL 25 07/01/2016 0939   LDLCALC 71 07/01/2016 0939      Wt Readings from Last 3 Encounters:  07/27/17 215 lb (97.5 kg)  07/25/17 215 lb  12.8 oz (97.9 kg)  07/03/17 214 lb (97.1 kg)      ASSESSMENT AND PLAN:  1 Neuro  CVA  On ASA an Plavix   Followed in neuro  Gets Botox to face  for spasm    2  CAD  No symptoms of angina  Follow    3  HL  Check lidps    4  HTN  BP is up  WIll add low dose 2.5 amlodipine    F/U in 2 months with BP     Current medicines are reviewed at length with the patient today.  The patient does not have concerns regarding medicines.  Signed, Dorris Carnes, MD  07/27/2017 10:45 AM    [address], [place], [place]  [address] Phone: [phone]; Fax: [phone]

## 2017-08-02 ENCOUNTER — Other Ambulatory Visit: Payer: Self-pay | Admitting: *Deleted

## 2017-08-02 DIAGNOSIS — E785 Hyperlipidemia, unspecified: Secondary | ICD-10-CM

## 2017-08-02 MED ORDER — ATORVASTATIN CALCIUM 80 MG PO TABS: 80.0000 mg | ORAL_TABLET | Freq: Every day | ORAL | 3 refills | Status: DC

## 2017-08-04 NOTE — Telephone Encounter (Signed)
I called and scheduled the patient for her next injection.  °

## 2017-09-28 ENCOUNTER — Ambulatory Visit: Payer: Self-pay | Admitting: Internal Medicine

## 2017-10-24 ENCOUNTER — Ambulatory Visit: Payer: Self-pay | Admitting: Neurology

## 2017-11-14 ENCOUNTER — Telehealth: Payer: Self-pay | Admitting: Neurology

## 2017-11-14 NOTE — Telephone Encounter (Signed)
Pt is asking to r/s her xeomin injection appointment due to a conflict in schedule.  Pt is asking for a new appointment for about 3 weeks.  Please call

## 2017-11-15 ENCOUNTER — Ambulatory Visit: Payer: Self-pay | Admitting: Neurology

## 2017-11-16 ENCOUNTER — Encounter: Payer: Self-pay | Admitting: Internal Medicine

## 2017-11-16 ENCOUNTER — Ambulatory Visit (INDEPENDENT_AMBULATORY_CARE_PROVIDER_SITE_OTHER): Payer: Self-pay | Admitting: Internal Medicine

## 2017-11-16 ENCOUNTER — Encounter (INDEPENDENT_AMBULATORY_CARE_PROVIDER_SITE_OTHER): Payer: Self-pay

## 2017-11-16 VITALS — BP 148/78 | HR 67 | Ht 68.5 in | Wt 205.6 lb

## 2017-11-16 DIAGNOSIS — I2510 Atherosclerotic heart disease of native coronary artery without angina pectoris: Secondary | ICD-10-CM

## 2017-11-16 DIAGNOSIS — I10 Essential (primary) hypertension: Secondary | ICD-10-CM

## 2017-11-16 DIAGNOSIS — I63019 Cerebral infarction due to thrombosis of unspecified vertebral artery: Secondary | ICD-10-CM

## 2017-11-16 DIAGNOSIS — E785 Hyperlipidemia, unspecified: Secondary | ICD-10-CM

## 2017-11-16 LAB — LIPID PANEL
CHOLESTEROL TOTAL: 114 mg/dL (ref 100–199)
Chol/HDL Ratio: 2.7 ratio (ref 0.0–4.4)
HDL: 43 mg/dL (ref >39)
LDL CALC: 57 mg/dL (ref 0–99)
TRIGLYCERIDES: 68 mg/dL (ref 0–149)
VLDL CHOLESTEROL CAL: 14 mg/dL (ref 5–40)

## 2017-11-16 MED ORDER — AMLODIPINE BESYLATE 5 MG PO TABS: 5.0000 mg | ORAL_TABLET | Freq: Every day | ORAL | 3 refills | Status: DC

## 2017-11-16 NOTE — Progress Notes (Signed)
Cardiology Office Note   Date:  11/16/2017   ID:  Ashley [name], DOB [birth year], MRN [id number]  PCP:  Patient, No Pcp Per  Cardiologist:   Dorris Carnes, MD   F/U of CAD and BP     History of Present Illness: Ashley [name] is a 51 y.o. female with a history of CAD (s/p CABG 2012), MVP with severe MR (s/p repair)  Dilated CM (LVEF normalized), HTN, HL and tobacco use.  Pt also had a CVA in 2017 July.  Rx with [facility] Echo 2013: LVEF 50%  I saw her in December 2018  BP was elevated   I  added amlodopine to regimenn 2.5 mg    Since seen she denies CP  Breathing is OK   No dizziness   No swelling    Outpatient Medications Prior to Visit  Medication Sig Dispense Refill  . amLODipine (NORVASC) 2.5 MG tablet Take 1 tablet (2.5 mg total) by mouth daily. 90 tablet 3  . aspirin EC 81 MG tablet Take 1 tablet (81 mg total) by mouth daily.    . botulinum toxin Type A (BOTOX) 100 units SOLR injection Inject 100 Units into the muscle every 3 (three) months.    . carvedilol (COREG) 12.5 MG tablet Take 2 tablets every AM and 1 tablet every PM 270 tablet 3  . clopidogrel (PLAVIX) 75 MG tablet take 1 tablet by mouth once daily 60 tablet 9  . famotidine (PEPCID) 20 MG tablet Take 20 mg by mouth daily as needed. For indigestion    . lisinopril (PRINIVIL,ZESTRIL) 20 MG tablet Take 1 tablet (20 mg total) by mouth daily. 90 tablet 3  . vitamin B-12 (CYANOCOBALAMIN) 1000 MCG tablet Take 1 tablet (1,000 mcg total) by mouth daily.    . vitamin C (ASCORBIC ACID) 500 MG tablet Take 500 mg by mouth 2 (two) times daily.    [name] atorvastatin (LIPITOR) 80 MG tablet Take 1 tablet (80 mg total) by mouth daily. 90 tablet 3  . amiodarone (PACERONE) 200 MG tablet Take 200 mg by mouth 2 (two) times daily. Begin 7 days prior to surgery.    . furosemide (LASIX) 40 MG tablet Take 1 tablet (40 mg total) by mouth daily. 5 tablet 0  . potassium chloride SA (K-DUR,KLOR-CON) 20 MEQ tablet Take 1 tablet (20 mEq total) by mouth daily. 5  tablet 0   Facility-Administered Medications Prior to Visit  Medication Dose Route Frequency Provider Last Rate Last Dose  . incobotulinumtoxinA (XEOMIN) 50 units injection 50 Units  50 Units Intramuscular Q90 days Marcial Pacas, MD   50 Units at 11/30/16 1546  . incobotulinumtoxinA (XEOMIN) 50 units injection 50 Units  50 Units Intramuscular Q90 days Marcial Pacas, MD   50 Units at 07/25/17 [phone]  . incobotulinumtoxinA (XEOMIN) 50 units injection 50 Units  50 Units Intramuscular Q90 days Marcial Pacas, MD   50 Units at 07/26/17 1217     Allergies:   Azithromycin and Latex   Past Medical History:  Diagnosis Date  . Abdominal pain    ? chronic cholecystitis; admxn to [facility] 9/12 (CT, USN, HIDA done)  . Cardiomyopathy    echo 9/12: mild LVH, EF 35-40%, mod MR (difficult to judge /post leaflet restricted), mod BAE, mod RVE, mod TR   . Cardiomyopathy ([place])    a. echo 9/12: mild LVH, EF 35-40%, mod MR (difficult to judge /post leaflet restricted), mod BAE, mod RVE, mod TR. b. F/u echo 11/2011: mild LVH, EF 55-60%, s/p  MV repair without significant MR/MS, mildly dilated RV/RA, PASP 63mmHg.   . Carotid artery disease ([place])    a. Duplex 06/2015: 50% RECA, 1-39% BICA.  [name] Coronary artery disease    a. s/p CABG 10/2011 - LIMA-LAD, SVG-PDA, SVG-OM, SVG-diagonal.  . Dyslipidemia   . Essential hypertension   . Hemifacial spasm   . History of Doppler ultrasound    a. Carotid US [facility] in [place], [name]) 7/17: no hemodynamically significant ICA stenosis  . Mitral regurgitation    a. s/p MV repair 10/2011 - on Coumadin for 3 months afterwards then d/c'd by surgery.  . Recurrent upper respiratory infection (URI)   . 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 Stroke [facility])    a. age 23 - in setting of cigs and OCPs. //  b. s/p acute L parietal CVA >> tPA - [facility] in [place], [name]  . Tobacco abuse     Past Surgical History:  Procedure Laterality Date  . CARDIAC  CATHETERIZATION    . CESAREAN SECTION    . cesarean section    . CORONARY ARTERY BYPASS GRAFT  10/21/2011   Procedure: CORONARY ARTERY BYPASS GRAFTING (CABG);  Surgeon: [name], MD;  Location: [place];  Service: Open Heart Surgery;  Laterality: N/A;  cabg x four, using right leg greater saphenous vein harvested endoscopically  . DENTAL SURGERY    . MITRAL VALVE REPAIR  10/21/2011   Procedure: MITRAL VALVE REPAIR (MVR);  Surgeon: [name], MD;  Location: [place];  Service: Open Heart Surgery;  Laterality: N/A;  . TEE WITHOUT CARDIOVERSION  06/30/2011   Procedure: TRANSESOPHAGEAL ECHOCARDIOGRAM (TEE);  Surgeon: [name], MD;  Location[facility] ENDOSCOPY;  Service: Cardiovascular;  Laterality: N/A;  . TONSILLECTOMY    . TUBAL LIGATION       Social History:  The patient  reports that she quit smoking about 6 years ago. She has a 30.00 pack-year smoking history. She has never used smokeless tobacco. She reports that she drinks about 0.6 oz of alcohol per week. She reports that she does not use drugs.   Family History:  The patient's family history includes COPD in her father; Heart disease in her father; Hypertension in her mother; Stroke in her mother.    ROS:  Please see the history of present illness. All other systems are reviewed and  Negative to the above problem except as noted.    PHYSICAL EXAM: VS:  BP (!) 148/78   Pulse 67   Ht 5' 8.5" (1.74 m)   Wt 205 lb 9.6 oz (93.3 kg)   SpO2 97%   BMI 30.81 kg/m    BP on my check 138/78    GEN: Well nourished, well developed, in no acute distress  HEENT: normal  Neck: no JVD, carotid bruits, or masses Cardiac: RRR; no murmurs, rubs, or gallops,no edema  Respiratory:  clear to auscultation bilaterally, normal work of breathing GI: soft, nontender, nondistended, + BS  No hepatomegaly  MS: no deformity Moving all extremities   Skin: warm and dry, no rash Neuro:  Facial droop   Psych: euthymic mood, full affect   EKG:  EKG is  not ordered today.   Lipid Panel    Component Value Date/Time   CHOL 143 07/27/2017 1127   TRIG 150 (H) 07/27/2017 1127   HDL 42 07/27/2017 1127   CHOLHDL 3.4 07/27/2017 1127   CHOLHDL 3.8  07/01/2016 0939   VLDL 25 07/01/2016 0939   LDLCALC 71 07/27/2017 1127      Wt Readings from Last 3 Encounters:  11/16/17 205 lb 9.6 oz (93.3 kg)  07/27/17 215 lb (97.5 kg)  07/25/17 215 lb 12.8 oz (97.9 kg)      ASSESSMENT AND PLAN:  1  HTN  BP could be optimized  Would increase amlodipine to 5 mg     2 Neuro  CVA  On ASA an Plavix   Followed in neuro  Gets Botox to face for spasm    3  CAD  Asymptomatic   Follow   4  HL  Lipitor was increased in dec for LDL 71  Will check panel today   Encouraged exercise and diet    F/U in December     Current medicines are reviewed at length with the patient today.  The patient does not have concerns regarding medicines.  Signed, Dorris Carnes, MD  11/16/2017 8:20 AM    [facility] [address], [place], [place]  [address] Phone: [phone]; Fax: [phone]

## 2017-11-16 NOTE — Telephone Encounter (Signed)
I called the patient to reschedule but she did not answer so I left a VM asking her to call me back.  °

## 2017-11-16 NOTE — Patient Instructions (Signed)
Your physician has recommended you make the following change in your medication:  1.) increase amlodipine to 5 mg once a day  Your physician recommends that you return for lab work today (lipids)  Your physician wants you to follow-up in: 6 months with Dr. Harrington Challenger.  You will receive a reminder letter in the mail two months in advance. If you don't receive a letter, please call our office to schedule the follow-up appointment.

## 2017-11-17 ENCOUNTER — Telehealth: Payer: Self-pay | Admitting: Internal Medicine

## 2017-11-17 NOTE — Telephone Encounter (Signed)
°  New message  Pt verbalized that she is calling for RN  For test results

## 2017-11-17 NOTE — Telephone Encounter (Signed)
Reviewed lab results with patient.

## 2017-12-29 NOTE — Progress Notes (Signed)
Ashley [name]  PATIENT: Ashley [name] DOB: [birth year]   REASON FOR VISIT: Follow-up for stroke event in July 2017 HISTORY FROM: Patient alone at visit    HISTORY OF PRESENT ILLNESS: Ashley [name] is a 52 y.o. 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   Pt had a stroke when she was 52 yo in the setting of OCP and smoking. She quit OCP since then. At that time, she woke up in the morning, could not talk at all with left arm weakness without leg weakness. She had PT/OT for 6 months with residual of slurry speech and left hand dexterity difficulty when she got tired or sick. She had followed with Dr. Erling [name] at [name] in the past.  She was admitted to [facility] in [place], [name] 03/05/16-03/08/16 for a stroke and treated with tPA. At the time, she was shopping in the store, felt some right arm tingling and then her daughter told her she has some facial droop. After sitting down, she was not able to talk for about 10 minutes. Symptoms gradually getting better in ER. Brain MRI demonstrated acute infarct within the L parietal white matter. Echo EF 50% and CUS no significant ICA stenosis. She was DC on Atorvastatin 80, Carvedilol 12.5 bid, Lisinopril 10 QD, Plavix 75 QD. Since DC, she has been doing well. She still has some speech difficulty which she thinks is chronic from previous stroke.She notes increasing bruising due to ASA+Plavix. Followed with cardiology and 30 day cardiac event monitoring ordered due to risk of afib.   She also has frequent right facial twitching and eye blinking since the heart surgery 4 years ago. It happens every day, worse with stress and fatigue. 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  She was noted to  have a dilated cardiomyopathy in 04/2011. LHC in 05/2011 demonstrated three-vessel CAD. TEE demonstrated severe mitral regurgitation, no PFO. She had CABG and MV repair by Dr. Roxy [name] on 10/21/11.   History of HTN, on Coreg and lisinopril. At home blood pressure 140-145, today BP 175/78. Patient complains of white coat syndrome.  History of HLD, on Lipitor 40 at home. LDL 06/2015 was 91 and 07/2015 was 67.  She was smoker and quit smoking last month after the second stroke. Social alcohol, denies any illicit drug use.  06/21/16 follow up - she has been doing well without stroke like symptoms. Currently she has 30 day cardiac event monitoring ongoing. She has botox injection for hemifacial spasm and right face looks better. 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 Her B12 low and currently on B12 supplement. Continued no smoking. BP today in clinic 163/79, but stated anxious in clinic.  12/20/16 follow up - pt has been doing the same. No recurrent stroke like symptoms. Had not repeat beta 2 glycoprotein yet. Had botox for hemifacial spasm with Dr. Krista [name] and much better. Only once a while, she will have facial twitching on the right. 30 day cardiac event monitoring negative. Continued no smoking. On B12, ASA and plavix and lipitor. BP  165/72. But stated at home her BP 135-145.  Interval history:07/03/17 DR. Xu During the interval time, pt has been doing well. Had botox 6 months ago for right hemifacial spasm. It lasted until 2 weeks ago and she felt her hemifacial spasm started to acting up. She is yet to make an appointment with Dr. Krista [name] for the botox. She is also going to follow up with Cardiologist Dr. Harrington [name] soon. Her BP at home 135-145/75 but today in clinic 195/85 and  she said she was anxious due to white coat syndrome and her sister is in hospital to check for cancer. Otherwise, no complains.  UPDATE 5/13/2019CM Ms. Ashley [name], 52 year old female returns for follow-up with history of stroke event in 2017 .  She continues to have intermittent hemifacial spasm and sees Dr. Krista [name] for Botox.  She also follows with cardiology Dr. Harrington [name].  She is currently on aspirin and Plavix for secondary stroke prevention with minimal bruising and no bleeding and no further stroke or TIA symptoms.  She remains on Lipitor without myalgias.  She says she walks for exercise.  Blood pressure in the office today 150/80.  She claims she has whitecoat syndrome.  She also has seasonal allergies for which she takes Benadryl occasionally.  She returns for reevaluation REVIEW OF SYSTEMS: Full 14 system review of systems performed and notable only for those listed, all others are neg:  Constitutional: neg  Cardiovascular: neg Ear/Nose/Throat: neg  Skin: neg Eyes: neg Respiratory: neg Gastroitestinal: neg  Hematology/Lymphatic: neg  Endocrine: neg Musculoskeletal:neg Allergy/Immunology: neg Neurological: neg Psychiatric: neg Sleep : neg   ALLERGIES: Allergies  Allergen Reactions  . Azithromycin Nausea Only  . Latex     rash    HOME MEDICATIONS: Outpatient Medications Prior to Visit  Medication Sig Dispense Refill  . amLODipine (NORVASC) 5 MG tablet Take 1 tablet (5 mg total) by mouth daily. 90 tablet 3  . aspirin EC 81 MG tablet Take 1 tablet (81 mg total) by mouth daily.    . botulinum toxin Type A (BOTOX) 100 units SOLR injection Inject 100 Units into the muscle every 3 (three) months.    . carvedilol (COREG) 12.5 MG tablet Take 2 tablets every AM and 1 tablet every PM 270 tablet 3  . clopidogrel (PLAVIX) 75 MG tablet take 1 tablet by mouth once daily 60 tablet 9  . famotidine (PEPCID) 20 MG tablet Take 20 mg by mouth daily as needed. For indigestion    . lisinopril  (PRINIVIL,ZESTRIL) 20 MG tablet Take 1 tablet (20 mg total) by mouth daily. 90 tablet 3  . vitamin B-12 (CYANOCOBALAMIN) 1000 MCG tablet Take 1 tablet (1,000 mcg total) by mouth daily.    . vitamin C (ASCORBIC ACID) 500 MG tablet Take 500 mg by mouth 2 (two) times daily.    [name] atorvastatin (LIPITOR) 80 MG tablet Take 1 tablet (80 mg total) by mouth daily. 90 tablet 3   Facility-Administered Medications Prior to Visit  Medication Dose Route Frequency Provider Last Rate Last Dose  . incobotulinumtoxinA (XEOMIN) 50 units injection 50 Units  50 Units Intramuscular Q90 days Marcial Pacas, MD   50 Units at 11/30/16 1546  . incobotulinumtoxinA (XEOMIN) 50 units injection 50 Units  50 Units Intramuscular Q90 days Marcial Pacas, MD   50 Units at 07/25/17 [phone]  . incobotulinumtoxinA (XEOMIN) 50 units injection 50 Units  50 Units Intramuscular Q90 days Marcial Pacas, MD   50 Units at 07/26/17 1217    PAST MEDICAL HISTORY: Past Medical  History:  Diagnosis Date  . Abdominal pain    ? chronic cholecystitis; admxn to [facility] 9/12 (CT, USN, HIDA done)  . Cardiomyopathy    echo 9/12: mild LVH, EF 35-40%, mod MR (difficult to judge /post leaflet restricted), mod BAE, mod RVE, mod TR   . Cardiomyopathy ([place])    a. echo 9/12: mild LVH, EF 35-40%, mod MR (difficult to judge /post leaflet restricted), mod BAE, mod RVE, mod TR. b. F/u echo 11/2011: mild LVH, EF 55-60%, s/p MV repair without significant MR/MS, mildly dilated RV/RA, PASP 61mmHg.   . Carotid artery disease ([place])    a. Duplex 06/2015: 50% RECA, 1-39% BICA.  [name] Coronary artery disease    a. s/p CABG 10/2011 - LIMA-LAD, SVG-PDA, SVG-OM, SVG-diagonal.  . Dyslipidemia   . Essential hypertension   . Hemifacial spasm   . History of Doppler ultrasound    a. Carotid US [facility] in [place], [name]) 7/17: no hemodynamically significant ICA stenosis  . Mitral regurgitation    a. s/p MV repair 10/2011 - on Coumadin for 3 months afterwards then d/c'd by surgery.  .  Recurrent upper respiratory infection (URI)   . 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 Stroke [facility])    a. age 33 - in setting of cigs and OCPs. //  b. s/p acute L parietal CVA >> tPA - [facility] in [place], [name]  . Tobacco abuse     PAST SURGICAL HISTORY: Past Surgical History:  Procedure Laterality Date  . CARDIAC CATHETERIZATION    . CESAREAN SECTION    . cesarean section    . CORONARY ARTERY BYPASS GRAFT  10/21/2011   Procedure: CORONARY ARTERY BYPASS GRAFTING (CABG);  Surgeon: [name], MD;  Location: [place];  Service: Open Heart Surgery;  Laterality: N/A;  cabg x four, using right leg greater saphenous vein harvested endoscopically  . DENTAL SURGERY    . MITRAL VALVE REPAIR  10/21/2011   Procedure: MITRAL VALVE REPAIR (MVR);  Surgeon: [name], MD;  Location: [place];  Service: Open Heart Surgery;  Laterality: N/A;  . TEE WITHOUT CARDIOVERSION  06/30/2011   Procedure: TRANSESOPHAGEAL ECHOCARDIOGRAM (TEE);  Surgeon: [name], MD;  Location: [facility] ENDOSCOPY;  Service: Cardiovascular;  Laterality: N/A;  . TONSILLECTOMY    . TUBAL LIGATION      FAMILY HISTORY: Family History  Problem Relation Age of Onset  . Hypertension Mother   . Stroke Mother   . Heart disease Father   . COPD Father   . Anesthesia problems Neg Hx   . Hypotension Neg Hx   . Malignant hyperthermia Neg Hx   . Pseudochol deficiency Neg Hx     SOCIAL HISTORY: Social History   Socioeconomic History  . Marital status: Married    Spouse name: Not on file  . Number of children: 2  . Years of education: 2  . Highest education level: Not on file  Occupational History  . 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  Social Needs  . Financial resource strain: Not on file  . Food insecurity:    Worry: Not on file    Inability: Not on file  . Transportation needs:    Medical: Not on file    Non-medical: Not on file  Tobacco Use  .  Smoking status: Former Smoker    Packs/day: 1.00    Years: 30.00  Pack years: 30.00    Last attempt to quit: 05/07/2011    Years since quitting: 6.6  . Smokeless tobacco: Never Used  Substance and Sexual Activity  . Alcohol use: Yes    Alcohol/week: 0.6 oz    Types: 1 Glasses of wine per week    Comment: 1 glass of red wine nightly  . Drug use: No  . Sexual activity: Yes  Lifestyle  . Physical activity:    Days per week: Not on file    Minutes per session: Not on file  . Stress: Not on file  Relationships  . Social connections:    Talks on phone: Not on file    Gets together: Not on file    Attends religious service: Not on file    Active member of club or organization: Not on file    Attends meetings of clubs or organizations: Not on file    Relationship status: Not on file  . Intimate partner violence:    Fear of current or ex partner: Not on file    Emotionally abused: Not on file    Physically abused: Not on file    Forced sexual activity: Not on file  Other Topics Concern  . Not on file  Social History Narrative   Lives at home with husband and son.   Right-handed.   2 cups caffeine daily.     PHYSICAL EXAM  Vitals:   01/01/18 1045  BP: (!) 150/80  Pulse: 72  Weight: 206 lb 3.2 oz (93.5 kg)  Height: 5' 8.5" (1.74 m)   Body mass index is 30.9 kg/m.  Generalized: Well developed, obese female in no acute distress  Head: normocephalic and atraumatic,. Oropharynx benign  Neck: Supple, no carotid bruits  Cardiac: Regular rate rhythm, no murmur  Musculoskeletal: No deformity   Neurological examination   Mentation: Alert oriented to time, place, history taking. Attention span and concentration appropriate. Recent and remote memory intact.  Follows all commands speech with occasional stutter and language fluent.   Cranial nerve II-XII: Pupils were equal round reactive to light extraocular movements were full, visual field were full on confrontational test.   Intermittent right eye, blinking twitching.  facial sensation and strength were normal. hearing was intact to finger rubbing bilaterally. Uvula tongue midline. head turning and shoulder shrug were normal and symmetric.Tongue protrusion into cheek strength was normal. Motor: normal bulk and tone, full strength in the BUE, BLE, except for mild left hand dexterity difficulty since her stroke Sensory: normal and symmetric to light touch, pinprick, and  Vibration in the upper and lower extremities  Coordination: finger-nose-finger, heel-to-shin bilaterally, no dysmetria, no tremor Reflexes: Symmetric upper and lower plantar responses were flexor bilaterally. Gait and Station: Rising up from seated position without assistance, normal stance,  moderate stride, good arm swing, smooth turning, able to perform tiptoe, and heel walking without difficulty. Tandem gait is steady.  No assistive device  DIAGNOSTIC DATA (LABS, IMAGING, TESTING) - I reviewed patient records, labs, notes, testing and imaging myself where available.  Lab Results  Component Value Date   WBC 6.7 07/27/2017   HGB 12.1 07/27/2017   HCT 34.4 07/27/2017   MCV 94 07/27/2017   PLT 239 07/27/2017      Component Value Date/Time   NA 140 07/27/2017 1127   K 4.6 07/27/2017 1127   CL 103 07/27/2017 1127   CO2 23 07/27/2017 1127   GLUCOSE 104 (H) 07/27/2017 1127   GLUCOSE 137 (H) 04/08/2016 1021  BUN 9 07/27/2017 1127   CREATININE 0.92 07/27/2017 1127   CREATININE 0.82 04/08/2016 1021   CALCIUM 9.3 07/27/2017 1127   PROT 7.0 07/01/2016 0939   ALBUMIN 4.1 07/01/2016 0939   AST 15 07/01/2016 0939   ALT 16 07/01/2016 0939   ALKPHOS 90 07/01/2016 0939   BILITOT 0.7 07/01/2016 0939   GFRNONAA 72 07/27/2017 1127   GFRAA 83 07/27/2017 1127   Lab Results  Component Value Date   CHOL 114 11/16/2017   HDL 43 11/16/2017   LDLCALC 57 11/16/2017   TRIG 68 11/16/2017   CHOLHDL 2.7 11/16/2017   Lab Results  Component Value Date    HGBA1C 5.5 04/12/2016   Lab Results  Component Value Date   YQMVHQIO96 295 (L) 04/12/2016   Lab Results  Component Value Date   TSH 1.550 04/12/2016      ASSESSMENT AND PLAN Wing Safi is a 52 y.o. female with PMH of CAD s/p CABG, MVP with severe MR s/p MV repair, dilated CM with prior EF 35-40% but improved to normal, prior stroke at age of 47 presumed due to smoking and OCP use, HTN, HLD, and tobacco abuse who followed up for a stroke at left parietal WM on 03/05/16. Echo EF 50% and CUS no significant ICA stenosis. She was DC on Atorvastatin 80, Carvedilol 12.5 bid, Lisinopril 10 QD, Plavix 75 QD. Since DC, she has been doing well. She still has some speech difficulty which she thinks is chronic from previous stroke.She is on ASA+Plavix now, will continue at this time. Followed with cardiology event monitor no atrial fib.  She had TEE in 2012 showed no PFO, MRI in 04/2016 showed multiple old bilateral anterior and posterior circulation infarcts indicting embolic events. MRA unremarkable. B12 was low and put on B12 supplement. Hypercoagulable showed mild elevated homocystenine level (20.5) but high titer of beta 2 glycoprotein IgG. Autoimmune work up negative. She had one pregnancy with twin but almost premature at 7w, she was put on bed rest for 14 weeks before giving birth. She had tube ligation after. Repeat beta 2 glycoprotein IgG was normal. Had botox treatment for hemifacial spasm with Dr. Cletis Athens injection some time ago according to patient , difficult to afford.    Plan: - continue ASA and plavix and lipitor for stroke and cardiac prevention - Follow up with your primary care physician for stroke risk factor modification. Recommend maintain blood pressure goal <130/80, diabetes with hemoglobin A1c goal below 7.0% and lipids with LDL cholesterol goal below 70 mg/dL.  - continue follow up with cardiology Dr. Harrington [name] - make appointment about botox prn for hemifacial spasm cannot afford  at this time - check BP at home and record. - continue abstain from smoking. - healthy diet and regular exercise. - continue B12 supplement.OTC Discharge from stroke clinic I spent 25 minutes in total face to face time with the patient more than 50% of which was spent counseling and coordination of care, reviewing test results reviewing medications and discussing and reviewing the diagnosis of stroke and management of risk factors and given written information for review later  Dennie Bible, [facility], [facility], [name] [address], [place], [place] [address] [phone]

## 2018-01-01 ENCOUNTER — Ambulatory Visit (INDEPENDENT_AMBULATORY_CARE_PROVIDER_SITE_OTHER): Payer: Self-pay | Admitting: Nurse Practitioner

## 2018-01-01 ENCOUNTER — Encounter: Payer: Self-pay | Admitting: Nurse Practitioner

## 2018-01-01 VITALS — BP 150/80 | HR 72 | Ht 68.5 in | Wt 206.2 lb

## 2018-01-01 DIAGNOSIS — Z8673 Personal history of transient ischemic attack (TIA), and cerebral infarction without residual deficits: Secondary | ICD-10-CM

## 2018-01-01 DIAGNOSIS — G5139 Clonic hemifacial spasm, unspecified: Secondary | ICD-10-CM

## 2018-01-01 DIAGNOSIS — Z87891 Personal history of nicotine dependence: Secondary | ICD-10-CM

## 2018-01-01 DIAGNOSIS — I10 Essential (primary) hypertension: Secondary | ICD-10-CM

## 2018-01-01 DIAGNOSIS — E538 Deficiency of other specified B group vitamins: Secondary | ICD-10-CM

## 2018-01-01 DIAGNOSIS — E785 Hyperlipidemia, unspecified: Secondary | ICD-10-CM

## 2018-01-01 NOTE — Patient Instructions (Addendum)
continue ASA and plavix and lipitor for stroke and cardiac prevention - Follow up with your primary care physician for stroke risk factor modification. Recommend maintain blood pressure goal <130/80, diabetes with hemoglobin A1c goal below 7.0% and lipids with LDL cholesterol goal below 70 mg/dL.  - continue follow up with cardiology Dr. Harrington Challenger - make appointment about botox for hemifacial spasm cannot afford at this time - check BP at home and record. - continue abstain from smoking. - healthy diet and regular exercise. - continue B12 supplement.OTC Discharge from stroke clinic  Stroke Prevention Some health problems and behaviors may make it more likely for you to have a stroke. Below are ways to lessen your risk of having a stroke.  Be active for at least 30 minutes on most or all days.  Do not smoke. Try not to be around others who smoke.  Do not drink too much alcohol. ? Do not have more than 2 drinks a day if you are a man. ? Do not have more than 1 drink a day if you are a woman and are not pregnant.  Eat healthy foods, such as fruits and vegetables. If you were put on a specific diet, follow the diet as told.  Keep your cholesterol levels under control through diet and medicines. Look for foods that are low in saturated fat, trans fat, cholesterol, and are high in fiber.  If you have diabetes, follow all diet plans and take your medicine as told.  Ask your doctor if you need treatment to lower your blood pressure. If you have high blood pressure (hypertension), follow all diet plans and take your medicine as told by your doctor.  If you are 69-56 years old, have your blood pressure checked every 3-5 years. If you are age 38 or older, have your blood pressure checked every year.  Keep a healthy weight. Eat foods that are low in calories, salt, saturated fat, trans fat, and cholesterol.  Do not take drugs.  Avoid birth control pills, if this applies. Talk to your doctor about  the risks of taking birth control pills.  Talk to your doctor if you have sleep problems (sleep apnea).  Take all medicine as told by your doctor. ? You may be told to take aspirin or blood thinner medicine. Take this medicine as told by your doctor. ? Understand your medicine instructions.  Make sure any other conditions you have are being taken care of.  Get help right away if:  You suddenly lose feeling (you feel numb) or have weakness in your face, arm, or leg.  Your face or eyelid hangs down to one side.  You suddenly feel confused.  You have trouble talking (aphasia) or understanding what people are saying.  You suddenly have trouble seeing in one or both eyes.  You suddenly have trouble walking.  You are dizzy.  You lose your balance or your movements are clumsy (uncoordinated).  You suddenly have a very bad headache and you do not know the cause.  You have new chest pain.  Your heart feels like it is fluttering or skipping a beat (irregular heartbeat). Do not wait to see if the symptoms above go away. Get help right away. Call your local emergency services (911 in [place] Do not drive yourself to the hospital. This information is not intended to replace advice given to you by your health care provider. Make sure you discuss any questions you have with your health care provider. Document Released: 02/07/2012  Document Revised: 01/14/2016 Document Reviewed: 02/08/2013 Elsevier Interactive Patient Education  [employer].

## 2018-01-03 NOTE — Progress Notes (Signed)
I reviewed above note and agree with the assessment and plan. I have made any additions or clarifications directly to the above note. Pt was seen and examined.   Rosalin Hawking, MD PhD Stroke Neurology 01/03/2018 12:49 PM

## 2018-05-21 ENCOUNTER — Other Ambulatory Visit: Payer: Self-pay | Admitting: Internal Medicine

## 2018-07-11 ENCOUNTER — Other Ambulatory Visit: Payer: Self-pay | Admitting: Internal Medicine

## 2018-09-28 ENCOUNTER — Other Ambulatory Visit: Payer: Self-pay | Admitting: Internal Medicine

## 2018-09-28 MED ORDER — ATORVASTATIN CALCIUM 80 MG PO TABS: 80.0000 mg | ORAL_TABLET | Freq: Every day | ORAL | 0 refills | Status: DC

## 2018-09-28 NOTE — Telephone Encounter (Signed)
°*  STAT* If patient is at the pharmacy, call can be transferred to refill team.   1. Which medications need to be refilled? (please list name of each medication and dose if known) atorvastatin (LIPITOR) 80 MG tablet  2. Which pharmacy/location (including street and city if local pharmacy) is medication to be sent to? [place], [place]  3. Do they need a 30 day or 90 day supply? 90 days

## 2018-10-03 ENCOUNTER — Encounter: Payer: Self-pay | Admitting: Physician Assistant

## 2018-10-15 ENCOUNTER — Other Ambulatory Visit: Payer: Self-pay

## 2018-10-15 MED ORDER — CARVEDILOL 12.5 MG PO TABS: ORAL_TABLET | ORAL | 0 refills | Status: DC

## 2018-10-15 MED ORDER — LISINOPRIL 20 MG PO TABS: 20.0000 mg | ORAL_TABLET | Freq: Every day | ORAL | 0 refills | Status: DC

## 2018-10-22 ENCOUNTER — Ambulatory Visit (INDEPENDENT_AMBULATORY_CARE_PROVIDER_SITE_OTHER): Payer: Self-pay | Admitting: Physician Assistant

## 2018-10-22 ENCOUNTER — Telehealth: Payer: Self-pay | Admitting: *Deleted

## 2018-10-22 ENCOUNTER — Encounter: Payer: Self-pay | Admitting: Physician Assistant

## 2018-10-22 VITALS — BP 124/70 | HR 109 | Ht 68.5 in | Wt 208.0 lb

## 2018-10-22 DIAGNOSIS — I484 Atypical atrial flutter: Secondary | ICD-10-CM

## 2018-10-22 DIAGNOSIS — I2510 Atherosclerotic heart disease of native coronary artery without angina pectoris: Secondary | ICD-10-CM

## 2018-10-22 DIAGNOSIS — Z8673 Personal history of transient ischemic attack (TIA), and cerebral infarction without residual deficits: Secondary | ICD-10-CM

## 2018-10-22 DIAGNOSIS — R0683 Snoring: Secondary | ICD-10-CM

## 2018-10-22 DIAGNOSIS — I10 Essential (primary) hypertension: Secondary | ICD-10-CM

## 2018-10-22 DIAGNOSIS — Z9889 Other specified postprocedural states: Secondary | ICD-10-CM

## 2018-10-22 LAB — CBC
HEMATOCRIT: 34.9 % (ref 34.0–46.6)
Hemoglobin: 11.7 g/dL (ref 11.1–15.9)
MCH: 33 pg (ref 26.6–33.0)
MCHC: 33.5 g/dL (ref 31.5–35.7)
MCV: 98 fL — ABNORMAL HIGH (ref 79–97)
Platelets: 263 10*3/uL (ref 150–450)
RBC: 3.55 x10E6/uL — ABNORMAL LOW (ref 3.77–5.28)
RDW: 12.9 % (ref 11.7–15.4)
WBC: 7.3 10*3/uL (ref 3.4–10.8)

## 2018-10-22 LAB — BASIC METABOLIC PANEL
BUN/Creatinine Ratio: 11 (ref 9–23)
BUN: 10 mg/dL (ref 6–24)
CHLORIDE: 103 mmol/L (ref 96–106)
CO2: 20 mmol/L (ref 20–29)
Calcium: 9.4 mg/dL (ref 8.7–10.2)
Creatinine, Ser: 0.87 mg/dL (ref 0.57–1.00)
GFR calc non Af Amer: 76 mL/min/{1.73_m2} (ref >59)
GFR, EST AFRICAN AMERICAN: 87 mL/min/{1.73_m2} (ref >59)
Glucose: 110 mg/dL — ABNORMAL HIGH (ref 65–99)
Potassium: 4.6 mmol/L (ref 3.5–5.2)
Sodium: 140 mmol/L (ref 134–144)

## 2018-10-22 LAB — TSH: TSH: 1.6 u[IU]/mL (ref 0.450–4.500)

## 2018-10-22 MED ORDER — FUROSEMIDE 20 MG PO TABS: 20.0000 mg | ORAL_TABLET | ORAL | 1 refills | Status: DC | PRN

## 2018-10-22 MED ORDER — APIXABAN 5 MG PO TABS: 5.0000 mg | ORAL_TABLET | Freq: Two times a day (BID) | ORAL | 11 refills | Status: DC

## 2018-10-22 MED ORDER — CARVEDILOL 25 MG PO TABS: 25.0000 mg | ORAL_TABLET | Freq: Two times a day (BID) | ORAL | 6 refills | Status: DC

## 2018-10-22 MED ORDER — LISINOPRIL 20 MG PO TABS: 10.0000 mg | ORAL_TABLET | Freq: Every day | ORAL | 6 refills | Status: DC

## 2018-10-22 MED ORDER — DILTIAZEM HCL ER COATED BEADS 120 MG PO CP24: 120.0000 mg | ORAL_CAPSULE | Freq: Every day | ORAL | 6 refills | Status: DC

## 2018-10-22 NOTE — Telephone Encounter (Signed)
-----   Message from Philbert Riser sent at 10/22/2018  1:03 PM EST ----- Regarding: SLEEP STUDT [name], Per [name],  This patient needs a Home Sleep Study.  Thanks

## 2018-10-22 NOTE — Telephone Encounter (Signed)
Patient does not have insurance and wants to wait until next month to take the home sleep test. Patient will call our office when she is financially. able.

## 2018-10-22 NOTE — Patient Instructions (Addendum)
Medication Instructions:  STOP taking Plavix STOP taking Amlodipine  DECREASE Lisinopril to 10 mg Once daily (take 1/2 tablet of the 20 mg tablets)  START taking Eliquis 5 mg Twice daily  START taking Diltiazem CD 120 mg Once daily   Increase Coreg (Carvedilol) to 25 mg Twice daily   I will give you Lasix (Furosemide) 20 mg to take once daily as needed for swelling.  If you take it more than 2 days in a row, call.  If you need a refill on your cardiac medications before your next appointment, please call your pharmacy.   Lab work: Today - BMET, CBC, TSH  If you have labs (blood work) drawn today and your tests are completely normal, you will receive your results only by: [name] MyChart Message (if you have MyChart) OR . A paper copy in the mail If you have any lab test that is abnormal or we need to change your treatment, we will call you to review the results.  Testing/Procedures: Schedule an Echocardiogram Schedule a Home Sleep Study to rule out obstructive sleep apnea   Follow-Up: At [facility], you and your health needs are our priority.  As part of our continuing mission to provide you with exceptional heart care, we have created designated Provider Care Teams.  These Care Teams include your primary Cardiologist (physician) and Advanced Practice Providers (APPs -  Physician Assistants and Nurse Practitioners) who all work together to provide you with the care you need, when you need it. You will need a follow up appointment in:  4 weeks.  Please call our office 2 months in advance to schedule this appointment.  You may see Dorris Carnes, MD or Richardson Dopp, PA-C   Any Other Special Instructions Will Be Listed Below (If Applicable). You have atrial flutter.  This can increase your risk of stroke.  I am stopping your Plavix and starting you on Eliquis.  Eliquis is a blood thinner that is used to prevent strokes.  We will try to do a cardioversion (shock to the heart) after you have  taken Eliquis for 1 month.  If you miss a dose of Eliquis, let me know.  I am increasing the Coreg to control your heart rate.  You can take Lasix for swelling if it is particularly worse one day.  Call me if you feel you are more short of breath, swollen or fatigued.

## 2018-10-22 NOTE — Progress Notes (Signed)
Cardiology Office Note:    Date:  10/22/2018   ID:  Arbie Cookey Hannon, DOB 1966-04-10, MRN [id number]  PCP:  Patient, No Pcp Per  Cardiologist:  Dorris Carnes, MD   Electrophysiologist:  None   Referring MD: No ref. provider found   Chief Complaint  Patient presents with  . Follow-up    CAD, hx of MV repair    History of Present Illness:    Gloriann Mokry is a 53 y.o. female with CAD and MVP with severe MR s/p CABG + MV repair in March 2013, dilated CM with EF 35-40, prior CVA while taking OCPs, hypertension, hyperlipidemia, tobacco use.  Echocardiogram in 2013 demonstrated normal LV function.  She was admitted to [facility] in [place], [place] in 2017 with an acute left parietal stroke treated with tPA.  Echocardiogram at that time demonstrated EF 50%.  Event monitor in November 2017 demonstrated no atrial fibrillation.  She was last seen by Dr. Harrington Challenger in March 2019.    Ms. Nealis returns for follow-up.  She is here alone.  She has had some swelling in her ankles.  She has not had significant shortness of breath.  She denies chest pain, syncope, paroxysmal nocturnal dyspnea.  She denies any bleeding issues.  She denies palpitations.  Prior CV studies:   The following studies were reviewed today:  Event monitor 07/21/2016 SR with occasional PVCs  These PVCs were not sensed.    Carotid 11/16 1-39% bilateral ICA stenosis. - FU prn  Echo 4/13 Mild LVH, EF 55-60%, no RWMA, MV repair ok with no MS/MR, mod LAE, mild RVE, mild RAE, PASP 34 mmHg   Past Medical History:  Diagnosis Date  . Abdominal pain    ? chronic cholecystitis; admx[facility] to [facility] 9/12 (CT, USN, HIDA done)  . Cardiomyopathy    echo 9/12: mild LVH, EF 35-40%, mod MR (difficult to judge /post leaflet restricted), mod BAE, mod RVE, mod TR   . Cardiomyopathy ([place])    a. echo 9/12: mild LVH, EF 35-40%, mod MR (difficult to judge /post leaflet restricted), mod BAE, mod RVE, mod TR. b. F/u echo 11/2011: mild LVH,  EF 55-60%, s/p MV repair without significant MR/MS, mildly dilated RV/RA, PASP 43mmHg.   . Carotid artery disease ([place])    a. Duplex 06/2015: 50% RECA, 1-39% BICA.  [name] Coronary artery disease    a. s/p CABG 10/2011 - LIMA-LAD, SVG-PDA, SVG-OM, SVG-diagonal.  . Dyslipidemia   . Essential hypertension   . Hemifacial spasm   . History of Doppler ultrasound    a. Carotid US [facility] in [place], [name]) 7/17: no hemodynamically significant ICA stenosis  . Mitral regurgitation    a. s/p MV repair 10/2011 - on Coumadin for 3 months afterwards then d/c'd by surgery.  . Recurrent upper respiratory infection (URI)   . 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 Stroke [facility])    a. age 86 - in setting of cigs and OCPs. //  b. s/p acute L parietal CVA >> tPA - [facility] in [place], [name]  . Tobacco abuse    Surgical Hx: The patient  has a past surgical history that includes Tonsillectomy; Tubal ligation; Cesarean section; cesarean section; TEE without cardioversion (06/30/2011); Mitral valve repair (10/21/2011); Coronary artery bypass graft (10/21/2011); Cardiac catheterization; and Dental surgery.   Current Medications: Current Meds  Medication Sig  . aspirin EC 81 MG tablet Take 1 tablet (  81 mg total) by mouth daily.  [name] atorvastatin (LIPITOR) 80 MG tablet Take 1 tablet (80 mg total) by mouth daily. Please keep upcoming appt for future refills. Thank you.  . botulinum toxin Type A (BOTOX) 100 units SOLR injection Inject 100 Units into the muscle every 3 (three) months.  . famotidine (PEPCID) 20 MG tablet Take 20 mg by mouth daily as needed. For indigestion  . vitamin B-12 (CYANOCOBALAMIN) 1000 MCG tablet Take 1 tablet (1,000 mcg total) by mouth daily.  . vitamin C (ASCORBIC ACID) 500 MG tablet Take 500 mg by mouth 2 (two) times daily.  . [DISCONTINUED] amLODipine (NORVASC) 5 MG tablet Take 1 tablet (5 mg total) by mouth daily.  . [DISCONTINUED] carvedilol (COREG) 12.5  MG tablet TAKE 2 TABLETS BY MOUTH IN THE MORNING AND 1 TABLET BY MOUTH IN THE EVENING Please call and schedule an appt for further refills 1st attempt  . [DISCONTINUED] clopidogrel (PLAVIX) 75 MG tablet TAKE 1 TABLET BY MOUTH ONCE DAILY  . [DISCONTINUED] lisinopril (PRINIVIL,ZESTRIL) 20 MG tablet Take 1 tablet (20 mg total) by mouth daily.   Current Facility-Administered Medications for the 10/22/18 encounter (Office Visit) with Richardson Dopp T, PA-C  Medication  . incobotulinumtoxinA (XEOMIN) 50 units injection 50 Units  . incobotulinumtoxinA (XEOMIN) 50 units injection 50 Units  . incobotulinumtoxinA (XEOMIN) 50 units injection 50 Units     Allergies:   Azithromycin and Latex   Social History   Tobacco Use  . Smoking status: Former Smoker    Packs/day: 1.00    Years: 30.00    Pack years: 30.00    Last attempt to quit: 05/07/2011    Years since quitting: 7.4  . Smokeless tobacco: Never Used  Substance Use Topics  . Alcohol use: Yes    Alcohol/week: 1.0 standard drinks    Types: 1 Glasses of wine per week    Comment: 1 glass of red wine nightly  . Drug use: No     Family Hx: The patient's family history includes COPD in her father; Heart disease in her father; Hypertension in her mother; Stroke in her mother. There is no history of Anesthesia problems, Hypotension, Malignant hyperthermia, or Pseudochol deficiency.  ROS:   Please see the history of present illness.    Review of Systems  Cardiovascular: Positive for leg swelling.  Hematologic/Lymphatic: Bruises/bleeds easily.   All other systems reviewed and are negative.   EKGs/Labs/Other Test Reviewed:    EKG:  EKG is  ordered today.  The ekg ordered today demonstrates atrial flutter, 2-1 conduction, heart rate 109  Recent Labs: No results found for requested labs within last 8760 hours.   Recent Lipid Panel Lab Results  Component Value Date/Time   CHOL 114 11/16/2017 08:38 AM   TRIG 68 11/16/2017 08:38 AM   HDL 43  11/16/2017 08:38 AM   CHOLHDL 2.7 11/16/2017 08:38 AM   CHOLHDL 3.8 07/01/2016 09:39 AM   LDLCALC 57 11/16/2017 08:38 AM    Physical Exam:    VS:  BP 124/70   Pulse (!) 109   Ht 5' 8.5" (1.74 m)   Wt 208 lb (94.3 kg)   SpO2 93%   BMI 31.17 kg/m     Wt Readings from Last 3 Encounters:  10/22/18 208 lb (94.3 kg)  01/01/18 206 lb 3.2 oz (93.5 kg)  11/16/17 205 lb 9.6 oz (93.3 kg)     Physical Exam  Constitutional: She is oriented to person, place, and time. She appears well-developed and  well-nourished. No distress.  HENT:  Head: Normocephalic and atraumatic.  Eyes: No scleral icterus.  Neck: No JVD present. No thyromegaly present.  Cardiovascular: Regular rhythm. Tachycardia present.  No murmur heard. Pulmonary/Chest: She has no wheezes. She has no rales.  Abdominal: Soft. She exhibits no distension.  Musculoskeletal:        General: Edema (trace bilat ankle edema) present.  Lymphadenopathy:    She has no cervical adenopathy.  Neurological: She is alert and oriented to person, place, and time.  Skin: Skin is warm and dry.  Psychiatric: She has a normal mood and affect.    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  This is new for her.  She does have a prior history of stroke.  Question if this may have contributed to her previous strokes.  CHADS2-VASc=6 (CHF, hypertension, CVA, CAD, female).  We discussed the physiology of atrial flutter and the rationale for anticoagulation as well as rhythm control.  I also reviewed her case today with Dr. Harrington Challenger.  Her rhythm may not be amenable to ablation as it is atypical flutter.  We will see how large her atria are on echo before deciding if we should refer her to EP.    -DC Plavix  -Start Eliquis 5 mg Twice daily   -Decrease Lisinopril to 10 mg QD  -DC Amlodipine  -Start Diltiazem CD 120 mg QD  -Increase Coreg 25 mg BID  -Obtain BMET, CBC, TSH  -Schedule  Echocardiogram  -Schedule Home sleep test to r/o OSA (she has a hx of snoring)  -FU with Dr. Harrington Challenger in 3-4 weeks  Coronary artery disease involving native coronary artery of native heart without angina pectoris History of CABG in 2013.  She denies anginal symptoms.  Continue aspirin for now.  Stop Plavix as noted above.  Continue beta-blocker, statin.  S/P mitral valve repair Continue SBE prophylaxis.  Arrange follow-up echocardiogram as outlined above.  Essential hypertension The patient's blood pressure is controlled on her current regimen.  Continue current therapy.  She has noted some swelling in her ankles.  This may be related to the Amlodipine.  It is possible her arrhythmia is contributing.  I will give her Lasix 20 mg QD prn for swelling.  History of stroke I will follow-up with neurology to determine if she needs to remain on aspirin long-term.  Dispo:  Return in about 4 weeks (around 11/19/2018) for Close Follow Up, w/ Dr. Harrington Challenger.   Total time spent with patient today 40 minutes. This includes reviewing records, evaluating the patient and coordinating care. Face-to-face time >50%.   Medication Adjustments/Labs and Tests Ordered: Current medicines are reviewed at length with the patient today.  Concerns regarding medicines are outlined above.   Tests Ordered: Orders Placed This Encounter  Procedures  . Basic metabolic panel  . CBC  . TSH  . EKG 12-Lead  . ECHOCARDIOGRAM COMPLETE  . Home sleep test   Medication Changes: Meds ordered this encounter  Medications  . apixaban (ELIQUIS) 5 MG TABS tablet    Sig: Take 1 tablet (5 mg total) by mouth 2 (two) times daily.    Dispense:  60 tablet    Refill:  11  . lisinopril (PRINIVIL,ZESTRIL) 20 MG tablet    Sig: Take 0.5 tablets (10 mg total) by mouth daily.    Dispense:  30 tablet    Refill:  6  . diltiazem (CARDIZEM CD) 120 MG 24  hr capsule    Sig: Take 1 capsule (120 mg total) by mouth daily.    Dispense:  30 capsule     Refill:  6  . carvedilol (COREG) 25 MG tablet    Sig: Take 1 tablet (25 mg total) by mouth 2 (two) times daily with a meal.    Dispense:  60 tablet    Refill:  6  . furosemide (LASIX) 20 MG tablet    Sig: Take 1 tablet (20 mg total) by mouth as needed (for swelling).    Dispense:  30 tablet    Refill:  1    Signed, Richardson Dopp, PA-C  10/22/2018 11:35 AM    [address], [place], [place]  [address] Phone: [phone]; Fax: [phone]

## 2018-10-24 ENCOUNTER — Encounter: Payer: Self-pay | Admitting: Physician Assistant

## 2018-10-24 NOTE — Telephone Encounter (Signed)
This encounter was created in error - please disregard.

## 2018-10-24 NOTE — Telephone Encounter (Deleted)
-----   Message from Rosalin Hawking, MD sent at 10/23/2018  9:40 PM EST ----- Hi, Saquoia Sianez:  From stroke standpoint, the ASA can be discontinued. Unless in the future she has recurrent stroke with eliquis, ASA may be consider to add on at that time. Thanks   Rosalin Hawking, MD PhD Stroke Neurology 10/23/2018 9:42 PM  ----- Message ----- From: Sharmon Revere Sent: 10/22/2018   9:19 PM EST To: Rosalin Hawking, MD  Dr. Kerrie Pleasure took care of this lady a couple of years ago after her stroke.  She was treated at [facility] in [place].  She had been on dual antiplatelet Rx mainly for secondary stroke prevention.  She is now in AFlutter.  I stopped her Plavix, started her on Eliquis and continued the ASA.  From a cardiac standpoint, she would probably be ok just being on the Eliquis.  But I wanted to check with you to see if you thought she needed to remain on ASA from a stroke prevention standpoint.   Thanks for your help. Nihar Klus

## 2018-10-27 NOTE — Progress Notes (Signed)
Agree with recommendations to stop ASA   Keep on Eliquis

## 2018-11-01 ENCOUNTER — Telehealth: Payer: Self-pay | Admitting: Physician Assistant

## 2018-11-01 ENCOUNTER — Encounter: Payer: Self-pay | Admitting: Physician Assistant

## 2018-11-01 ENCOUNTER — Other Ambulatory Visit: Payer: Self-pay

## 2018-11-01 ENCOUNTER — Ambulatory Visit (HOSPITAL_COMMUNITY): Payer: Self-pay | Attending: Cardiology

## 2018-11-01 DIAGNOSIS — I484 Atypical atrial flutter: Secondary | ICD-10-CM | POA: Insufficient documentation

## 2018-11-01 NOTE — Telephone Encounter (Signed)
No message needed °

## 2018-11-01 NOTE — Telephone Encounter (Signed)
-----   Message from [name], MD sent at 10/27/2018 10:27 PM EST ----- Regarding: ASA   ----- Message ----- From: Sharmon Revere Sent: 10/24/2018   1:46 PM EST To: [name], MD Subject: ASA                                            Nevin Bloodgood I checked with Dr. Erlinda Hong and he did not think she needed to remain on ASA now that she is on Eliquis. She has a hx of MV repair.  In up to date, it looks like ASA alone is needed after the patient completes 3 mos of Coumadin post mitral valve repair.  Do you think we need to keep her on ASA in addition to the Eliquis? It seems like it should be ok to DC the ASA but I wanted to check with you first. Thanks [name] ----- Message ----- From: Rosalin Hawking, MD Sent: 10/23/2018   9:40 PM EST To: Liliane Shi, PA-C  Hi, Kahdijah Errickson:  From stroke standpoint, the ASA can be discontinued. Unless in the future she has recurrent stroke with eliquis, ASA may be consider to add on at that time. Thanks   Rosalin Hawking, MD PhD Stroke Neurology 10/23/2018 9:42 PM  ----- Message ----- From: Sharmon Revere Sent: 10/22/2018   9:19 PM EST To: Rosalin Hawking, MD  Dr. Kerrie Pleasure took care of this lady a couple of years ago after her stroke.  She was treated at [facility] in [place].  She had been on dual antiplatelet Rx mainly for secondary stroke prevention.  She is now in AFlutter.  I stopped her Plavix, started her on Eliquis and continued the ASA.  From a cardiac standpoint, she would probably be ok just being on the Eliquis.  But I wanted to check with you to see if you thought she needed to remain on ASA from a stroke prevention standpoint.   Thanks for your help. Sharline Lehane

## 2018-11-01 NOTE — Telephone Encounter (Signed)
Please call patient. I reviewed with Dr. Erlinda Hong and Dr. Harrington Challenger. Tell her that she can stop ASA. She needs to remain on Eliquis. Richardson Dopp, PA-C    11/01/2018 11:11 AM

## 2018-11-01 NOTE — Telephone Encounter (Signed)
SPOKE WITH PT AND SHE RECEIVED MESSAGE

## 2018-11-01 NOTE — Telephone Encounter (Signed)
LMOMVM OF STOPPING ASA  AND REMAINING ON ELIQUIS. PT WAS ASKED TO CONTACT CLINIC BACK TO ASSURE SHE GOT MESSAGE

## 2018-11-01 NOTE — Telephone Encounter (Signed)
Follow up  ° ° °Patient is returning your call. °

## 2018-11-02 ENCOUNTER — Telehealth: Payer: Self-pay | Admitting: *Deleted

## 2018-11-02 DIAGNOSIS — I484 Atypical atrial flutter: Secondary | ICD-10-CM

## 2018-11-02 NOTE — Telephone Encounter (Signed)
SPOKE WITH PT AND PT VERBALIZED UNDERSTANDING. PT STATED SHE HAS NOT BEEN TAKING THE DILTIAZEM DUE TO  THE FACT SHE HASN'T PICKED UP RX.  PER WEAVER PT NEEDS TO BE HAVE TAKEN MEDICINE AT LEAST THREE TO 4 DAYS BEFORE GETTING SCHEDULED FOR MONITOR TO ASSESS TO BETTER ASSESS HER HEARTRATE.  PT IS GOING TO PHARMACY TO PICK UP DILTIAZEM 120 MG TODAY AND START TAKING MEDICINE.

## 2018-11-02 NOTE — Telephone Encounter (Signed)
-----   Message from Liliane Shi, [place] sent at 11/01/2018  5:24 PM EDT ----- Please call patient The echocardiogram shows that her heart function is reduced (ejection fraction).  This may be related to the atrial flutter.  Elevated heart rates can cause a weak heart.  Cardizem is usually not used when heart function is low.  But, since the reduced ejection fraction may be due to a fast heart rate, we will continue the Cardizem for now.  We need to make sure her HR is well controlled.   Recommendations:  - Continue current medications.  - FU with Dr. Harrington Challenger as scheduled.  - Schedule patient for a 24 Hr Holter to assess HR control.   - Send copy to PCP. Richardson Dopp, PA-C    11/01/2018 5:14 PM

## 2018-11-02 NOTE — Telephone Encounter (Signed)
LMOVM TO CONTACT CLINIC BACK FOR RESULTS AND RECCOMENDATIONS

## 2018-11-05 ENCOUNTER — Encounter: Payer: Self-pay | Admitting: Physician Assistant

## 2018-11-05 NOTE — Addendum Note (Signed)
Addended by: Claude Manges on: 11/05/2018 04:46 PM   Modules accepted: Orders

## 2018-11-05 NOTE — Telephone Encounter (Signed)
This encounter was created in error - please disregard.

## 2018-11-07 ENCOUNTER — Ambulatory Visit (INDEPENDENT_AMBULATORY_CARE_PROVIDER_SITE_OTHER): Payer: Self-pay

## 2018-11-07 ENCOUNTER — Other Ambulatory Visit: Payer: Self-pay

## 2018-11-07 ENCOUNTER — Other Ambulatory Visit: Payer: Self-pay | Admitting: Physician Assistant

## 2018-11-07 DIAGNOSIS — I483 Typical atrial flutter: Secondary | ICD-10-CM

## 2018-11-07 DIAGNOSIS — I484 Atypical atrial flutter: Secondary | ICD-10-CM

## 2018-11-09 ENCOUNTER — Telehealth: Payer: Self-pay | Admitting: Physician Assistant

## 2018-11-09 NOTE — Telephone Encounter (Signed)
° ° °  Pt c/o swelling: STAT is pt has developed SOB within 24 hours  1) How much weight have you gained and in what time span?  N/A  2) If swelling, where is the swelling located? ANKLES, FEET  3) Are you currently taking a fluid pill? YES  4) Are you currently SOB? NO  5) Do you have a log of your daily weights (if so, list)? N/A  6) Have you gained 3 pounds in a day or 5 pounds in a week? N/A  7) Have you traveled recently? NO

## 2018-11-09 NOTE — Telephone Encounter (Signed)
Pt has had intermittent ankle and feet swelling since she last seen Richardson Dopp, PA-C.  States she was told to call if it lasted a few days in a row.  Pt states left swells worse than right.  Has occurred the last 3 days and says the more she walks, the worse the swelling.  Takes Furosemide 20mg  PRN and it resolves the issue.  Denies diet changes.  Doesn't weigh herself everyday but states her pants aren't any tighter so she doesn't think it is affecting her weight.  No vitals available.  Advised pt to try compression stockings.  I also advised that I will send message to Dr. Harrington Challenger for advisement as well.

## 2018-11-12 NOTE — Telephone Encounter (Signed)
Left voice mail    Can take lasix 20 mg 2 to 3 x per week Watch salt intake Support socks Elevate legs

## 2018-11-13 ENCOUNTER — Telehealth: Payer: Self-pay | Admitting: Internal Medicine

## 2018-11-13 NOTE — Telephone Encounter (Signed)
Called pt and advised her of Dr. Harrington Challenger recommendations. Pt verbalized understanding. Advised her to call back with any other acute symptoms.

## 2018-11-14 ENCOUNTER — Telehealth: Payer: Self-pay | Admitting: Internal Medicine

## 2018-11-14 NOTE — Telephone Encounter (Signed)
[name], MD  Rodman Key, RN        Called pt  REviewed holter results  Would recomm increasing coreg to 37.5 am/25 pm  Follow BP and HR  I tolerates then increase to 37.5 bid  Email my chart in 1 wk with BP and how feeling    Notes recorded by Katrine Coho, RN on 11/14/2018 at 9:01 AM EDT I discussed Dr Harrington Challenger recommendations with patient, she verbalized understanding, confirmed she has MyChart (her son is helping her) and will send in BP/HR readings in 1 week, did not need new prescription right now. ------

## 2018-11-14 NOTE — Telephone Encounter (Signed)
Spoke to patient yesterday.    She will call back with response to change in meds Post pone appt until end of April /   May be ok for televisit precardioversion

## 2018-11-16 ENCOUNTER — Encounter: Payer: Self-pay | Admitting: Nurse Practitioner

## 2018-11-16 NOTE — Telephone Encounter (Addendum)
Cancelled appointment with Dr. Harrington Challenger 3/30. Routed to R/S pool.Result Notes for HOLTER MONITOR - 24 HOUR   Notes recorded by Katrine Coho, RN on 11/14/2018 at 9:01 AM EDT I discussed Dr Harrington Challenger recommendations with patient, she verbalized understanding, confirmed she has MyChart (her son is helping her) and will send in BP/HR readings in 1 week, did not need new prescription right now. ------  Notes recorded by [name], MD on 11/13/2018 at 1:19 PM EDT Called pt REviewed holter results Would recomm increasing coreg to 37.5 am/25 pm  Follow BP and HR  I tolerates then increase to 37.5 bid Email my chart in 1 wk with BP and how feeling        ----------------------------------------------------------------------

## 2018-11-19 ENCOUNTER — Ambulatory Visit: Payer: Self-pay | Admitting: Internal Medicine

## 2018-12-05 ENCOUNTER — Other Ambulatory Visit: Payer: Self-pay | Admitting: Internal Medicine

## 2018-12-09 NOTE — Progress Notes (Signed)
Virtual Visit via Video Note   This visit type was conducted due to national recommendations for restrictions regarding the COVID-19 Pandemic (e.g. social distancing) in an effort to limit this patient's exposure and mitigate transmission in our community.  Due to her co-morbid illnesses, this patient is at least at moderate risk for complications without adequate follow up.  This format is felt to be most appropriate for this patient at this time.  All issues noted in this document were discussed and addressed.  A limited physical exam was performed with this format.  Please refer to the patient's chart for her consent to telehealth for [facility].   Evaluation Performed:  Follow-up visit  Date:  12/10/2018   ID:  Ashley [name], DOB December 05, 1965, MRN [id number]  Patient Location: Home Provider Location: Office  PCP:  Patient, No Pcp Per  Cardiologist:  Dorris Carnes, MD  Electrophysiologist:  None   Chief Complaint:  Atrial flutter  History of Present Illness:    Ashley [name] is a 53 y.o. female with CAD and MVP with severe MR s/p CABG + MV repair in March 2013, dilated CM with EF 35-40, prior CVA while taking OCPs, hypertension, hyperlipidemia, tobacco use.  Echocardiogram in 2013 demonstrated normal LV function.  She was admitted to [facility] in [place], [place] in 2017 with an acute left parietal stroke treated with tPA.  Echocardiogram at that time demonstrated EF 50%.  Event monitor in November 2017 demonstrated no atrial fibrillation.  She was last seen by Dr. Harrington Challenger in March 2019.  and Crista Curb 10/22/18 CHADS2-VASc=6 (CHF, hypertension, CVA, CAD, female).  On that visit she was found to be in a flutter with RVR.  She was placed on eliquis and ASA was stopped  She has had coreg increased.   Recent echo with decrease in EF to 30%  And Holter with a flutter, increased coreg to 37.5 am and 25 in pm.  Then up to 37.5 BID if tolerates.   On eliqis and stopped asa  -also with lower ext edema and taking lasix 20 mg 2-3 times per week. Support stockings.  Sleep study has not yet been ordered due to COVID 19.    Today she is doing well.  No chest pain and only occ SOB.  She feels her heart racing at times.  She took a lasix last week but has not taken but that once -otherwise no swelling.   Plans are for DCCV once elective procedures can be done.   She has not missed any eliquis.    She wears a N95 mask if she is out.  She washes her hands when out.  Mostly she is inside she is aware she is high risk for increased issues with COVID.   The patient does not have symptoms concerning for COVID-19 infection (fever, chills, cough, or new shortness of breath).    Past Medical History:  Diagnosis Date  . Abdominal pain    ? chronic cholecystitis; admxn to [facility] 9/12 (CT, USN, HIDA done)  . Cardiomyopathy    echo 9/12: mild LVH, EF 35-40%, mod MR (difficult to judge /post leaflet restricted), mod BAE, mod RVE, mod TR   . Cardiomyopathy ([place])    a. echo 9/12: mild LVH, EF 35-40%, mod MR (difficult to judge /post leaflet restricted), mod BAE, mod RVE, mod TR. b. Echo 11/2011: mild LVH, EF 55-60%, s/p MV repair w/o sig MR/MS, mildly dilated RV/RA, PASP 4mmHg. // AFL dx 10/2018 -  Echo 10/2018: EF 30, diff HK, s/p MV repair with trivial MR, mean MV 7 but not c/w significant mitral stenosis, mild RAE, severe LAE, mod reduced RVSF   . Carotid artery disease ([place])    a. Duplex 06/2015: 50% RECA, 1-39% BICA.  [name] Coronary artery disease    a. s/p CABG 10/2011 - LIMA-LAD, SVG-PDA, SVG-OM, SVG-diagonal.  . Dyslipidemia   . Essential hypertension   . Hemifacial spasm   . History of Doppler ultrasound    a. Carotid US [facility] in [place], [name]) 7/17: no hemodynamically significant ICA stenosis  . Mitral regurgitation    a. s/p MV repair 10/2011 - on Coumadin for 3 months afterwards then d/c'd by surgery.  . Recurrent upper respiratory infection (URI)   . S/P CABG x 4  10/21/2011  . 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 Stroke [facility])    a. age 66 - in setting of cigs and OCPs. //  b. s/p acute L parietal CVA >> tPA - [facility] in [place], [name]  . Tobacco abuse    Past Surgical History:  Procedure Laterality Date  . CARDIAC CATHETERIZATION    . CESAREAN SECTION    . cesarean section    . CORONARY ARTERY BYPASS GRAFT  10/21/2011   Procedure: CORONARY ARTERY BYPASS GRAFTING (CABG);  Surgeon: [name], MD;  Location: [facility];  Service: Open Heart Surgery;  Laterality: N/A;  cabg x four, using right leg greater saphenous vein harvested endoscopically  . DENTAL SURGERY    . MITRAL VALVE REPAIR  10/21/2011   Procedure: MITRAL VALVE REPAIR (MVR);  Surgeon: [name], MD;  Location: [facility];  Service: Open Heart Surgery;  Laterality: N/A;  . TEE WITHOUT CARDIOVERSION  06/30/2011   Procedure: TRANSESOPHAGEAL ECHOCARDIOGRAM (TEE);  Surgeon: [name], MD;  Location: [facility] ENDOSCOPY;  Service: Cardiovascular;  Laterality: N/A;  . TONSILLECTOMY    . TUBAL LIGATION       Current Meds  Medication Sig  . apixaban (ELIQUIS) 5 MG TABS tablet Take 1 tablet (5 mg total) by mouth 2 (two) times daily.  [name] atorvastatin (LIPITOR) 80 MG tablet Take 1 tablet (80 mg total) by mouth daily. Please keep upcoming appt for future refills. Thank you.  . botulinum toxin Type A (BOTOX) 100 units SOLR injection Inject 100 Units into the muscle every 3 (three) months.  . carvedilol (COREG) 25 MG tablet Take 1 and 1/2 tablets (37.5mg ) by mouth in the AM and 1 tablet by mouth (25mg ) in the PM  . diltiazem (CARDIZEM CD) 120 MG 24 hr capsule Take 1 capsule (120 mg total) by mouth daily.  . famotidine (PEPCID) 20 MG tablet Take 20 mg by mouth daily as needed. For indigestion  . furosemide (LASIX) 20 MG tablet Take 1 tablet (20 mg total) by mouth as needed (for swelling).  [name] lisinopril (ZESTRIL) 20 MG tablet Take 10 mg by mouth daily.  . vitamin B-12 (CYANOCOBALAMIN) 1000 MCG tablet  Take 1 tablet (1,000 mcg total) by mouth daily.  . vitamin C (ASCORBIC ACID) 500 MG tablet Take 500 mg by mouth 2 (two) times daily.   Current Facility-Administered Medications for the 12/10/18 encounter (Telemedicine) with Isaiah Serge, NP  Medication  . incobotulinumtoxinA (XEOMIN) 50 units injection 50 Units  . incobotulinumtoxinA (XEOMIN) 50 units injection 50 Units  . incobotulinumtoxinA (XEOMIN) 50 units injection 50 Units     Allergies:   Azithromycin and Latex  Social History   Tobacco Use  . Smoking status: Former Smoker    Packs/day: 1.00    Years: 30.00    Pack years: 30.00    Last attempt to quit: 05/07/2011    Years since quitting: 7.6  . Smokeless tobacco: Never Used  Substance Use Topics  . Alcohol use: Yes    Alcohol/week: 1.0 standard drinks    Types: 1 Glasses of wine per week    Comment: 1 glass of red wine nightly  . Drug use: No     Family Hx: The patient's family history includes COPD in her father; Heart disease in her father; Hypertension in her mother; Stroke in her mother. There is no history of Anesthesia problems, Hypotension, Malignant hyperthermia, or Pseudochol deficiency.  ROS:   Please see the history of present illness.    General:no colds or fevers, no weight changes Skin:no rashes or ulcers HEENT:no blurred vision, no congestion CV:see HPI PUL:see HPI GI:no diarrhea constipation or melena, no indigestion GU:no hematuria, no dysuria MS:no joint pain, no claudication Neuro:no syncope, no lightheadedness Endo:no diabetes, no thyroid disease  All other systems reviewed and are negative.   Prior CV studies:   The following studies were reviewed today:  Event monitor 07/21/2016 SR with occasional PVCs These PVCs were not sensed.   Carotid 11/16 1-39% bilateral ICA stenosis.- FU prn  Echo 4/13 Mild LVH, EF 55-60%, no RWMA, MV repair ok with no MS/MR, mod LAE, mild RVE, mild RAE, PASP 34 mmHg  Echo 11/01/18  IMPRESSIONS     1. The left ventricle has a visually estimated ejection fraction of 30%. The cavity size was normal. Left ventricular diastolic Doppler parameters are indeterminate. Left ventricular diffuse hypokinesis.  2. Status post mitral valve repair. Trivial mitral regurgitation. Mean gradient across mitral valve 7 mmHg but PHT short at 71 msec so doubt significant mitral stenosis.  3. The aortic valve is tricuspid Mild calcification of the aortic valve. no stenosis of the aortic valve.  4. The aortic root and ascending aorta are normal in size and structure.  5. Right atrial size was mildly dilated.  6. Left atrial size was severely dilated.  7. The inferior vena cava was dilated in size with >50% respiratory variability. PA systolic pressure 35 mmHg.  8. The right ventricle has moderately reduced systolic function. The cavity was normal. There is no increase in right ventricular wall thickness.  FINDINGS  Left Ventricle: The left ventricle has a visually estimated ejection fraction of 30. The cavity size was normal. There is no increase in left ventricular wall thickness. Left ventricular diastolic Doppler parameters are indeterminate. Left ventricular  diffuse hypokinesis. Right Ventricle: The right ventricle has moderately reduced systolic function. The cavity was normal. There is no increase in right ventricular wall thickness. Left Atrium: left atrial size was severely dilated Right Atrium: right atrial size was mildly dilated. Right atrial pressure is estimated at 8 mmHg. Interatrial Septum: No atrial level shunt detected by color flow Doppler. Pericardium: There is no evidence of pericardial effusion. Mitral Valve: The mitral valve has been repaired/replaced. Mitral valve regurgitation is trivial by color flow Doppler. Status post mitral valve repair. Tricuspid Valve: The tricuspid valve is normal in structure. Tricuspid valve regurgitation is trivial by color flow Doppler. Aortic Valve:  The aortic valve is tricuspid Mild calcification of the aortic valve. Aortic valve regurgitation was not visualized by color flow Doppler. There is no stenosis of the aortic valve. Pulmonic Valve: The pulmonic valve was  normal in structure. Pulmonic valve regurgitation is not visualized by color flow Doppler. Aorta: The aortic root and ascending aorta are normal in size and structure. Venous: The inferior vena cava is dilated in size with greater than 50% respiratory variability.   Holter 10/2018 Atypical atrial flutter   44 to 126 bpm  Average HR 101 bpm Longest pause 1.8 seconds    Labs/Other Tests and Data Reviewed:    EKG:  An ECG dated 10/22/18 was personally reviewed today and demonstrated:  a flutter 2:1 conduction and incomplete RBBB RVR  Recent Labs: 10/22/2018: BUN 10; Creatinine, Ser 0.87; Hemoglobin 11.7; Platelets 263; Potassium 4.6; Sodium 140; TSH 1.600   Recent Lipid Panel Lab Results  Component Value Date/Time   CHOL 114 11/16/2017 08:38 AM   TRIG 68 11/16/2017 08:38 AM   HDL 43 11/16/2017 08:38 AM   CHOLHDL 2.7 11/16/2017 08:38 AM   CHOLHDL 3.8 07/01/2016 09:39 AM   LDLCALC 57 11/16/2017 08:38 AM    Wt Readings from Last 3 Encounters:  12/10/18 208 lb (94.3 kg)  10/22/18 208 lb (94.3 kg)  01/01/18 206 lb 3.2 oz (93.5 kg)     Objective:    Vital Signs:  BP 137/85   Pulse (!) 106   Ht 5' 8.5" (1.74 m)   Wt 208 lb (94.3 kg)   BMI 31.17 kg/m    VITAL SIGNS:  reviewed  Neuro alert and Oriented X 3 MAE, follows commands Lungs can complete sentences without SOB or wheezes  Psych: affect pleasant   ASSESSMENT & PLAN:    1. Atypical a flutter, rate mostly controlled will increase coreg to 37.5 BID but will check with Dr. Harrington Challenger and stop dilt with EF 30% and increase coreg to 50 BID.   Plan to DCCV once elective procedures are available.  This may be the end of May so will have pt do telehealth visit with Dr. Harrington Challenger the middle of May to determine timing on DCCV 2.  Anticoagulation on Eliquis and has not missed a dose and no bleeding 3.  CAD with CABG in 2013 and no angina.  Her ASA has been stopped.   4. S/p MV repair +SBE prophylaxis.  On Echo MV with trivial MR, 5. HTN controlled  6. Hx CVA   COVID-19 Education: The signs and symptoms of COVID-19 were discussed with the patient and how to seek care for testing (follow up with PCP or arrange E-visit).  The importance of social distancing was discussed today.  Time:   Today, I have spent 15 minutes with the patient with telehealth technology discussing the above problems.     Medication Adjustments/Labs and Tests Ordered: Current medicines are reviewed at length with the patient today.  Concerns regarding medicines are outlined above.   Tests Ordered: No orders of the defined types were placed in this encounter.   Medication Changes: No orders of the defined types were placed in this encounter.   Disposition:  Follow up in 3 week(s) with Dr. Harrington Challenger  Signed, Cecilie Kicks, NP  12/10/2018 11:12 AM    [place]

## 2018-12-10 ENCOUNTER — Other Ambulatory Visit: Payer: Self-pay

## 2018-12-10 ENCOUNTER — Telehealth (INDEPENDENT_AMBULATORY_CARE_PROVIDER_SITE_OTHER): Payer: Self-pay | Admitting: Cardiology

## 2018-12-10 ENCOUNTER — Encounter: Payer: Self-pay | Admitting: Cardiology

## 2018-12-10 VITALS — BP 137/85 | HR 106 | Ht 68.5 in | Wt 208.0 lb

## 2018-12-10 DIAGNOSIS — I484 Atypical atrial flutter: Secondary | ICD-10-CM

## 2018-12-10 DIAGNOSIS — Z951 Presence of aortocoronary bypass graft: Secondary | ICD-10-CM

## 2018-12-10 DIAGNOSIS — Z9889 Other specified postprocedural states: Secondary | ICD-10-CM

## 2018-12-10 DIAGNOSIS — Z8673 Personal history of transient ischemic attack (TIA), and cerebral infarction without residual deficits: Secondary | ICD-10-CM

## 2018-12-10 DIAGNOSIS — I10 Essential (primary) hypertension: Secondary | ICD-10-CM

## 2018-12-10 DIAGNOSIS — I2510 Atherosclerotic heart disease of native coronary artery without angina pectoris: Secondary | ICD-10-CM

## 2018-12-10 DIAGNOSIS — I63019 Cerebral infarction due to thrombosis of unspecified vertebral artery: Secondary | ICD-10-CM

## 2018-12-10 DIAGNOSIS — Z7901 Long term (current) use of anticoagulants: Secondary | ICD-10-CM

## 2018-12-10 DIAGNOSIS — Z952 Presence of prosthetic heart valve: Secondary | ICD-10-CM

## 2018-12-10 MED ORDER — CARVEDILOL 25 MG PO TABS: 37.5000 mg | ORAL_TABLET | Freq: Two times a day (BID) | ORAL | 3 refills | Status: DC

## 2018-12-10 NOTE — Patient Instructions (Addendum)
Medication Instructions:  Your physician has recommended you make the following change in your medication:  1.  INCREASE the Coreg to 37.5 mg twice a day CONTINUE THE ELIQUIS AND DO NOT MISS ANY DOSES   If you need a refill on your cardiac medications before your next appointment, please call your pharmacy.   Lab work: None ordered  If you have labs (blood work) drawn today and your tests are completely normal, you will receive your results only by: [name] MyChart Message (if you have MyChart) OR . A paper copy in the mail If you have any lab test that is abnormal or we need to change your treatment, we will call you to review the results. Testing/Procedures: None ordered   Follow-Up:Your physician recommends that you schedule a follow-up appointment in: [place] DR. ROSS TO DISCUSS A CARDIOVERSION.  I WILL HAVE SOME CALL YOU TO SET THIS UP.  Any Other Special Instructions Will Be Listed Below (If Applicable).

## 2018-12-10 NOTE — Progress Notes (Signed)
Go on that  Send home  A 24 hour holter monitor   In a week

## 2018-12-11 ENCOUNTER — Other Ambulatory Visit: Payer: Self-pay | Admitting: *Deleted

## 2018-12-11 DIAGNOSIS — I484 Atypical atrial flutter: Secondary | ICD-10-CM

## 2018-12-11 NOTE — Addendum Note (Signed)
Addended by: Gaetano Net on: 12/11/2018 09:55 AM   Modules accepted: Orders

## 2018-12-13 ENCOUNTER — Telehealth: Payer: Self-pay | Admitting: *Deleted

## 2018-12-13 NOTE — Telephone Encounter (Signed)
Multiple attempts made to contact patient regarding having monitor mailed to her home.  Need insurance information, if none, need to give self pay instructions for the ZIO XT.  They also have assistance program patient may apply for.  Unable to leave VM due to mail box full status.

## 2018-12-13 NOTE — Telephone Encounter (Signed)
Follow up: ° ° ° °Patient returning your call. °

## 2018-12-13 NOTE — Telephone Encounter (Signed)
Patient enrolled for 3 day ZIO XT long term holter monitor to be mailed to her home in lieu of the currently unavailable 24 hour holter monitor.  Patient is self pay.  Instructed when she receives the monitor call the [employer] for a quote, she can also apply for their assistance program at that time if need be.  If she feels the cost is too high for her to manage, please call the office, we can get a quote from another company which may be a lower out of pocket cost.  Basic instructions were reviewed as they are included in the monitor kit.

## 2018-12-18 ENCOUNTER — Telehealth: Payer: Self-pay | Admitting: Internal Medicine

## 2018-12-18 NOTE — Telephone Encounter (Signed)
° ° °  Patient calling the office for samples of medication:   1.  What medication and dosage are you requesting samples for? apixaban (ELIQUIS) 5 MG TABS tablet  2.  Are you currently out of this medication? 4 doses left.   Pt said the pharmacy would not accept her discount card for Eliquis  Pt only has 4 pills left.She is starting the paperwork to get a reduced rate, but will be out of medication before the process is finished

## 2018-12-19 ENCOUNTER — Ambulatory Visit (INDEPENDENT_AMBULATORY_CARE_PROVIDER_SITE_OTHER): Payer: Self-pay

## 2018-12-19 DIAGNOSIS — I484 Atypical atrial flutter: Secondary | ICD-10-CM

## 2018-12-19 NOTE — Telephone Encounter (Signed)
This pt is requesting samples of Eliquis, not a rx refill.  Anticoagulation clinic does not have samples of Eliquis will route back to refill to check for samples.

## 2018-12-19 NOTE — Telephone Encounter (Signed)
The pt states that she has no insurance coverage for her medications and cannot afford Eliquis. She states that she has 1 Eliquis tablet left and is requesting samples.  She called [facility] yesterday and was advised that they are mailing her a Pt Asst Application to apply for assistance with the cost of Eliquis.  I have advised her that we will leave her 2 boxes of Eliquis samples and a [employer] pt asst application (quicker than waiting on application that [employer] is mailing to her as she states she is picking up today from office) in the downstairs lobby at our [facility] location for her to pick up. I've asked her to fill out her application ASAP and either mail it to [employer] or drop off at the office along with her 2019 prooif of income.  She verbalized understanding to all of the above and thanked me for my assistance.

## 2018-12-19 NOTE — Telephone Encounter (Signed)
Ashley [name], this pt is requesting samples of Eliquis, stating that her pharmacy will not accept discount card. Could you please look into this and advise on what needs to be done. Thanks

## 2018-12-25 ENCOUNTER — Other Ambulatory Visit: Payer: Self-pay | Admitting: Internal Medicine

## 2018-12-25 ENCOUNTER — Other Ambulatory Visit: Payer: Self-pay | Admitting: Physician Assistant

## 2018-12-27 ENCOUNTER — Telehealth: Payer: Self-pay

## 2018-12-27 NOTE — Telephone Encounter (Signed)
Pt Asst application completed and signed by Dr Tamala Julian (DOD) and faxed to [employer].

## 2019-01-01 ENCOUNTER — Telehealth: Payer: Self-pay | Admitting: Internal Medicine

## 2019-01-01 ENCOUNTER — Other Ambulatory Visit: Payer: Self-pay

## 2019-01-01 NOTE — Telephone Encounter (Signed)
New Message           Patient is calling to get monitor results and she has questions about some medication. Pls call to [employer]

## 2019-01-01 NOTE — Telephone Encounter (Signed)
Returned pts call.  She was inquiring about her Patient Assistance Application for Eliquis. Pt advised that it has been faxed, according to notes in pts chart, and I wasn't sure of their turn around time. Pt is in need of samples of Eliquis 5 mg, as she states that she only has 3 pills left. Pt aware I will route this to the refill dept and let them contact her to see if there any available. Pt very appreciative for the call.

## 2019-01-01 NOTE — Telephone Encounter (Signed)
Called pt to inform her that I would be leaving 2 weeks supply of Eliquis 5 mg tablet downstairs for pt to pick up until pt's assistance application is taken care of. Pt verbalized understanding.

## 2019-01-15 ENCOUNTER — Telehealth: Payer: Self-pay | Admitting: Internal Medicine

## 2019-01-15 ENCOUNTER — Telehealth: Payer: Self-pay | Admitting: *Deleted

## 2019-01-15 MED ORDER — DIGOXIN 250 MCG PO TABS: 0.2500 mg | ORAL_TABLET | Freq: Every day | ORAL | 3 refills | Status: DC

## 2019-01-15 NOTE — Telephone Encounter (Signed)
Is there any way we can get her samples to get her through a cardioversion and then switch her to Warfarin?  She saw Cecilie Kicks in 11/2018.  The plan was to follow up in May to discuss possible cardioversion.  She does not have an appointment to follow up.  She will need this scheduled with Dr. Harrington Challenger or me.  She would need 8-10 weeks of Eliquis at least.  Richardson Dopp, PA-C    01/15/2019 5:15 PM

## 2019-01-15 NOTE — Telephone Encounter (Signed)
Richardson Dopp, PA started [employer]

## 2019-01-15 NOTE — Telephone Encounter (Signed)
I contacted pt for different reason (monitor results). She told me Eliquis is $498 per month. She pays cash for all medicines. Per pt she was denied pt assistance from drug company and the reason was income.  She only has enough samples for tomorrow.  She will need more samples so that she does not miss any doses. Pt is willing to consider going back to warfarin if needed.  She feels the visits for INR checks will cost less than Eliquis.

## 2019-01-15 NOTE — Telephone Encounter (Signed)
Spoke with patient. She will pick up and begin tomorrow.

## 2019-01-15 NOTE — Telephone Encounter (Signed)
Notes recorded by Rodman Key, RN on 01/09/2019 at 3:03 PM EDT Routing to Dr. Harrington Challenger to clarify what dose of digoxin pt should start. Pt asks will Dr. Harrington Challenger be considering cardioversion as was discussed with her at last visit with APP. ------  Notes recorded by [name], MD on 01/05/2019 at 2:06 PM EDT Monitor shows atrial fibrillation  COnfirm meds  As listed Would recomm adding Digoxin to regimen for better rate control (Averag HR over 100) Set up for BMET in 2 wks WOuld get 24 hour monitor in 3 months from this one

## 2019-01-15 NOTE — Telephone Encounter (Signed)
Letter received from [employer] stating that they have denied the pt assistance with her Eliquis. Reason listed in letter: Patient income  Will forward message to Dr Harrington Challenger and her nurse as Juluis Rainier.

## 2019-01-15 NOTE — Telephone Encounter (Signed)
New Message    Pt c/o medication issue:  1. Name of Medication: Eliquis  2. How are you currently taking this medication (dosage and times per day)? 5mg  twice a day  3. Are you having a reaction (difficulty breathing--STAT)? NO  4. What is your medication issue? Patient states the medication is too expensive and the patient assist program denied her.  So she wants to know if there are any other alternatives for her.

## 2019-01-15 NOTE — Telephone Encounter (Signed)
-----   Message from [name], MD sent at 01/10/2019  9:40 AM EDT ----- Would recomm 0.25 mg daily of Digoxin

## 2019-01-15 NOTE — Telephone Encounter (Signed)
Will forward note to Lyla Son to provide the pt with enough Eliquis samples to last until Dr Harrington Challenger make decision as the pt has been denied Pt Asst with Eliquis through [employer]. Ok to give 2 bottles of Eliquis 5 mg samples.

## 2019-01-16 NOTE — Telephone Encounter (Signed)
Leaving pt 2 boxes of Eliquis 5 mg tablet downstairs for pt to pick up per Mardene Celeste Via, LPN.

## 2019-01-16 NOTE — Telephone Encounter (Signed)
We will leave a months worth of samples for patient downstairs today in hopes that her DCCV will get scheduled soon.

## 2019-01-16 NOTE — Telephone Encounter (Signed)
**Note De-Identified Ashley [name] Obfuscation** Will forward to Janan Halter for advisement as the office cannot accommodate this at this time.

## 2019-01-16 NOTE — Telephone Encounter (Signed)
After s/w Janan Halter, clinical supervisor, I called the pt and advised her that we are leaving her a months worth of Eliquis samples in the downstairs lobby at the [facility] office located at [facility] in [place]. She is aware to call and request more samples when she gets down to 5 days worth of the samples she is being given today.  She verbalized understanding and thanked me for calling her back.

## 2019-01-16 NOTE — Telephone Encounter (Signed)
It will be tight with current supply of Eliquis, but I think we could locate enough to supply her through the cardioversion.   Please let me know if you would like for me to leave these at the front for her. Thanks!

## 2019-01-18 ENCOUNTER — Telehealth: Payer: Self-pay

## 2019-01-18 NOTE — Telephone Encounter (Signed)
Called and spoke with patient about coming into the office for her upcoming appointment with Dr Harrington Challenger. She was agreeable to this.

## 2019-01-21 ENCOUNTER — Encounter: Payer: Self-pay | Admitting: *Deleted

## 2019-01-21 ENCOUNTER — Other Ambulatory Visit: Payer: Self-pay

## 2019-01-21 ENCOUNTER — Encounter: Payer: Self-pay | Admitting: Internal Medicine

## 2019-01-21 ENCOUNTER — Ambulatory Visit (INDEPENDENT_AMBULATORY_CARE_PROVIDER_SITE_OTHER): Payer: Self-pay | Admitting: Internal Medicine

## 2019-01-21 VITALS — BP 152/100 | HR 71 | Ht 68.5 in | Wt 211.8 lb

## 2019-01-21 DIAGNOSIS — Z9889 Other specified postprocedural states: Secondary | ICD-10-CM

## 2019-01-21 DIAGNOSIS — I10 Essential (primary) hypertension: Secondary | ICD-10-CM

## 2019-01-21 DIAGNOSIS — I2510 Atherosclerotic heart disease of native coronary artery without angina pectoris: Secondary | ICD-10-CM

## 2019-01-21 DIAGNOSIS — I4819 Other persistent atrial fibrillation: Secondary | ICD-10-CM

## 2019-01-21 DIAGNOSIS — I6330 Cerebral infarction due to thrombosis of unspecified cerebral artery: Secondary | ICD-10-CM

## 2019-01-21 NOTE — Patient Instructions (Addendum)
Medication Instructions:  No changes If you need a refill on your cardiac medications before your next appointment, please call your pharmacy.   Lab work: Today: cbc, bmet If you have labs (blood work) drawn today and your tests are completely normal, you will receive your results only by: [name] MyChart Message (if you have MyChart) OR . A paper copy in the mail If you have any lab test that is abnormal or we need to change your treatment, we will call you to review the results.  Testing/Procedures: Your physician has recommended that you have a Cardioversion (DCCV). Electrical Cardioversion uses a jolt of electricity to your heart either through paddles or wired patches attached to your chest. This is a controlled, usually prescheduled, procedure. Defibrillation is done under light anesthesia in the hospital, and you usually go home the day of the procedure. This is done to get your heart back into a normal rhythm. You are not awake for the procedure. Please see the instruction sheet given to you today.   Follow-Up: We will call you to arrange BP check/EKG/follow up at the end of June.  Any Other Special Instructions Will Be Listed Below (If Applicable).

## 2019-01-21 NOTE — Progress Notes (Signed)
Cardiology Office Note   Date:  01/21/2019   ID:  Ashley [name], DOB 06-14-1966, MRN [id number]  PCP:  Patient, No Pcp Per  Cardiologist:   Dorris Carnes, MD   F/U of rhythm     History of Present Illness: Ashley [name] is a 53 y.o. female with a history of CAD (s/p CABG 2012), MVP with severe MR (s/p repair)  Dilated CM (LVEF normalized), HTN, HL and tobacco use.  Pt also had a CVA in 2017 July.  Rx with [facility] 2013: LVEF 50%  I saw her in March 2019     In March 2020 shw was seen by Kathleen Argue   Was in atrial flutter with RVR (May have developed after URI)   Started on Eliquis   Meds adjusted to get better control of HR   Echo done   Also sleep study scheduled  Due to COVID infeciton follow up in person delayed    The pt says after URI she felt like she never recovered fully   Better but still has days of decreased energy   Still neotes increased SOB with activity at times   No edema   No CP   No PND   No palpitations or dizziness   Echo showec LVEF 30%   RV function moderately reduced   Felt possib ly due to high HR     Holter monitor done  Showed average HR over 100   Meds adjusted   Again to get better control of heart rate  SInce she was seen in clinic she denies palpitations   No dizzines   Does not more ffatigue.  Gives out easier with activity    Outpatient Medications Prior to Visit  Medication Sig Dispense Refill  . apixaban (ELIQUIS) 5 MG TABS tablet Take 1 tablet (5 mg total) by mouth 2 (two) times daily. 60 tablet 11  . atorvastatin (LIPITOR) 80 MG tablet Take 1 tablet (80 mg total) by mouth daily at 6 PM. 90 tablet 3  . botulinum toxin Type A (BOTOX) 100 units SOLR injection Inject 100 Units into the muscle every 3 (three) months.    . carvedilol (COREG) 25 MG tablet Take 1.5 tablets (37.5 mg total) by mouth 2 (two) times daily. 270 tablet 3  . digoxin (LANOXIN) 0.25 MG tablet Take 1 tablet (0.25 mg total) by mouth daily. 90 tablet 3  . famotidine (PEPCID) 20 MG  tablet Take 20 mg by mouth daily as needed. For indigestion    . furosemide (LASIX) 20 MG tablet TAKE 1 TABLET BY MOUTH AS NEEDED FOR  SWELLING 90 tablet 2  . lisinopril (ZESTRIL) 20 MG tablet Take 10 mg by mouth daily.    . vitamin B-12 (CYANOCOBALAMIN) 1000 MCG tablet Take 1 tablet (1,000 mcg total) by mouth daily.    . vitamin C (ASCORBIC ACID) 500 MG tablet Take 500 mg by mouth 2 (two) times daily.    [name] diltiazem (CARDIZEM CD) 120 MG 24 hr capsule Take 1 capsule (120 mg total) by mouth daily. 30 capsule 6   Facility-Administered Medications Prior to Visit  Medication Dose Route Frequency Provider Last Rate Last Dose  . incobotulinumtoxinA (XEOMIN) 50 units injection 50 Units  50 Units Intramuscular Q90 days Marcial Pacas, MD   50 Units at 11/30/16 1546  . incobotulinumtoxinA (XEOMIN) 50 units injection 50 Units  50 Units Intramuscular Q90 days Marcial Pacas, MD   50 Units at 07/25/17 [phone]  . incobotulinumtoxinA (XEOMIN) 50 units  injection 50 Units  50 Units Intramuscular Q90 days Marcial Pacas, MD   50 Units at 07/26/17 1217     Allergies:   Azithromycin and Latex   Past Medical History:  Diagnosis Date  . Abdominal pain    ? chronic cholecystitis; adm[facility] to [facility] 9/12 (CT, USN, HIDA done)  . Cardiomyopathy    echo 9/12: mild LVH, EF 35-40%, mod MR (difficult to judge /post leaflet restricted), mod BAE, mod RVE, mod TR   . Cardiomyopathy ([place])    a. echo 9/12: mild LVH, EF 35-40%, mod MR (difficult to judge /post leaflet restricted), mod BAE, mod RVE, mod TR. b. Echo 11/2011: mild LVH, EF 55-60%, s/p MV repair w/o sig MR/MS, mildly dilated RV/RA, PASP 77mmHg. // AFL dx 10/2018 - Echo 10/2018: EF 30, diff HK, s/p MV repair with trivial MR, mean MV 7 but not c/w significant mitral stenosis, mild RAE, severe LAE, mod reduced RVSF   . Carotid artery disease ([place])    a. Duplex 06/2015: 50% RECA, 1-39% BICA.  [name] Coronary artery disease    a. s/p CABG 10/2011 - LIMA-LAD, SVG-PDA, SVG-OM, SVG-diagonal.  .  Dyslipidemia   . Essential hypertension   . Hemifacial spasm   . History of Doppler ultrasound    a. Carotid US [facility] in [place], [name]) 7/17: no hemodynamically significant ICA stenosis  . Mitral regurgitation    a. s/p MV repair 10/2011 - on Coumadin for 3 months afterwards then d/c'd by surgery.  . Recurrent upper respiratory infection (URI)   . 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 Stroke [facility])    a. age 7 - in setting of cigs and OCPs. //  b. s/p acute L parietal CVA >> tPA - [facility] in [place], [name]  . Tobacco abuse     Past Surgical History:  Procedure Laterality Date  . CARDIAC CATHETERIZATION    . CESAREAN SECTION    . cesarean section    . CORONARY ARTERY BYPASS GRAFT  10/21/2011   Procedure: CORONARY ARTERY BYPASS GRAFTING (CABG);  Surgeon: [name], MD;  Location: [place];  Service: Open Heart Surgery;  Laterality: N/A;  cabg x four, using right leg greater saphenous vein harvested endoscopically  . DENTAL SURGERY    . MITRAL VALVE REPAIR  10/21/2011   Procedure: MITRAL VALVE REPAIR (MVR);  Surgeon: [name], MD;  Location: [place];  Service: Open Heart Surgery;  Laterality: N/A;  . TEE WITHOUT CARDIOVERSION  06/30/2011   Procedure: TRANSESOPHAGEAL ECHOCARDIOGRAM (TEE);  Surgeon: [name], MD;  Location: [facility] ENDOSCOPY;  Service: Cardiovascular;  Laterality: N/A;  . TONSILLECTOMY    . TUBAL LIGATION       Social History:  The patient  reports that she quit smoking about 7 years ago. She has a 30.00 pack-year smoking history. She has never used smokeless tobacco. She reports current alcohol use of about 1.0 standard drinks of alcohol per week. She reports that she does not use drugs.   Family History:  The patient's family history includes COPD in her father; Heart disease in her father; Hypertension in her mother; Stroke in her mother.    ROS:  Please see the history of present illness. All other systems are  reviewed and  Negative to the above problem except as noted.    PHYSICAL EXAM: VS:  BP (!) 152/100   Pulse 71   Ht 5' 8.5" (  1.74 m)   Wt 211 lb 12.8 oz (96.1 kg)   SpO2 99%   BMI 31.74 kg/m      GEN: Obeses 53 yo  in no acute distress  HEENT: normal  Neck: no JVD, carotid bruits, or masses Cardiac: Irreg irreg  ; no murmurs, rubs, or gallops,no edema  Respiratory:  clear to auscultation bilaterally, normal work of breathing GI: soft, nontender, nondistended, + BS  No hepatomegaly  MS: no deformity Moving all extremities   Skin: warm and dry, no rash Neuro:  Facial droop   Psych: euthymic mood, full affect   EKG:  EKG is ordered today.  Atrial fibrllation   71 bpm   Lipid Panel    Component Value Date/Time   CHOL 114 11/16/2017 0838   TRIG 68 11/16/2017 0838   HDL 43 11/16/2017 0838   CHOLHDL 2.7 11/16/2017 0838   CHOLHDL 3.8 07/01/2016 0939   VLDL 25 07/01/2016 0939   LDLCALC 57 11/16/2017 0838      Wt Readings from Last 3 Encounters:  01/21/19 211 lb 12.8 oz (96.1 kg)  12/10/18 208 lb (94.3 kg)  10/22/18 208 lb (94.3 kg)      ASSESSMENT AND PLAN:  1 Atrial fib   Pt developed this winter atrial flutter/fib after URI   Rates are better   Work up/Rx delayed by COVID pandemic   She does notice decreased energy, increased SOB with activity since this winter    This may indeed be due to [place] for cardioversion this week   She has not missed any Eliquis (taking 2x per day)  Labs today F/U in clinic after to adjust meds   2  HTN   BP is high this AM   I am not going to adjust meds on this one check   WIll come in on Friday for cardioverstion   Will make changes after    3  Chronic systolic CHF   LV and RV dysfunction may be due to tachyarrhtynmia   Continue current meds for now    Reassess after cardioversion   Adjust to treat CHF  2 Neuro  Hx of CVA  Currently on anticoagulation and statin    3  CAD  S/p CABG in 2013  Asymptomatic   Follow   4  HL  LDL is  excellent on lipitor  5  S/p MV repair    Current medicines are reviewed at length with the patient today.  The patient does not have concerns regarding medicines.  Signed, Dorris Carnes, MD  01/21/2019 9:35 AM    [address], [place], [place]  [address] Phone: [phone]; Fax: [phone]

## 2019-01-22 ENCOUNTER — Other Ambulatory Visit (HOSPITAL_COMMUNITY)
Admission: RE | Admit: 2019-01-22 | Discharge: 2019-01-22 | Disposition: A | Discharge status: Home / Self Care | Payer: HRSA Program | Source: Ambulatory Visit | Attending: Cardiovascular Disease | Admitting: Cardiovascular Disease

## 2019-01-22 DIAGNOSIS — Z1159 Encounter for screening for other viral diseases: Secondary | ICD-10-CM | POA: Diagnosis present

## 2019-01-22 LAB — CBC
Hematocrit: 35 % (ref 34.0–46.6)
Hemoglobin: 11.7 g/dL (ref 11.1–15.9)
MCH: 32.1 pg (ref 26.6–33.0)
MCHC: 33.4 g/dL (ref 31.5–35.7)
MCV: 96 fL (ref 79–97)
Platelets: 187 10*3/uL (ref 150–450)
RBC: 3.64 x10E6/uL — ABNORMAL LOW (ref 3.77–5.28)
RDW: 12.9 % (ref 11.7–15.4)
WBC: 5.6 10*3/uL (ref 3.4–10.8)

## 2019-01-22 LAB — BASIC METABOLIC PANEL
BUN/Creatinine Ratio: 10 (ref 9–23)
BUN: 9 mg/dL (ref 6–24)
CO2: 27 mmol/L (ref 20–29)
Calcium: 9.1 mg/dL (ref 8.7–10.2)
Chloride: 99 mmol/L (ref 96–106)
Creatinine, Ser: 0.92 mg/dL (ref 0.57–1.00)
GFR calc Af Amer: 82 mL/min/{1.73_m2} (ref >59)
GFR calc non Af Amer: 71 mL/min/{1.73_m2} (ref >59)
Glucose: 95 mg/dL (ref 65–99)
Potassium: 4 mmol/L (ref 3.5–5.2)
Sodium: 143 mmol/L (ref 134–144)

## 2019-01-23 LAB — NOVEL CORONAVIRUS, NAA (HOSP ORDER, SEND-OUT TO REF LAB; TAT 18-24 HRS): SARS-CoV-2, NAA: NOT DETECTED

## 2019-01-25 ENCOUNTER — Encounter (HOSPITAL_COMMUNITY)
Admission: RE | Disposition: A | Discharge status: Home / Self Care | Payer: Self-pay | Source: Home / Self Care | Attending: Cardiovascular Disease

## 2019-01-25 ENCOUNTER — Ambulatory Visit (HOSPITAL_COMMUNITY): Payer: Self-pay | Admitting: Critical Care Medicine

## 2019-01-25 ENCOUNTER — Ambulatory Visit (HOSPITAL_COMMUNITY)
Admission: RE | Admit: 2019-01-25 | Discharge: 2019-01-25 | Disposition: A | Discharge status: Home / Self Care | Payer: Self-pay | Attending: Cardiovascular Disease | Admitting: Cardiovascular Disease

## 2019-01-25 ENCOUNTER — Encounter (HOSPITAL_COMMUNITY): Payer: Self-pay

## 2019-01-25 ENCOUNTER — Other Ambulatory Visit: Payer: Self-pay

## 2019-01-25 DIAGNOSIS — Z8673 Personal history of transient ischemic attack (TIA), and cerebral infarction without residual deficits: Secondary | ICD-10-CM | POA: Insufficient documentation

## 2019-01-25 DIAGNOSIS — Z9851 Tubal ligation status: Secondary | ICD-10-CM | POA: Insufficient documentation

## 2019-01-25 DIAGNOSIS — E785 Hyperlipidemia, unspecified: Secondary | ICD-10-CM | POA: Insufficient documentation

## 2019-01-25 DIAGNOSIS — I4891 Unspecified atrial fibrillation: Secondary | ICD-10-CM | POA: Insufficient documentation

## 2019-01-25 DIAGNOSIS — Z881 Allergy status to other antibiotic agents status: Secondary | ICD-10-CM | POA: Insufficient documentation

## 2019-01-25 DIAGNOSIS — Z79899 Other long term (current) drug therapy: Secondary | ICD-10-CM | POA: Insufficient documentation

## 2019-01-25 DIAGNOSIS — Z951 Presence of aortocoronary bypass graft: Secondary | ICD-10-CM | POA: Insufficient documentation

## 2019-01-25 DIAGNOSIS — Z86718 Personal history of other venous thrombosis and embolism: Secondary | ICD-10-CM | POA: Insufficient documentation

## 2019-01-25 DIAGNOSIS — I10 Essential (primary) hypertension: Secondary | ICD-10-CM | POA: Insufficient documentation

## 2019-01-25 DIAGNOSIS — Z87891 Personal history of nicotine dependence: Secondary | ICD-10-CM | POA: Insufficient documentation

## 2019-01-25 DIAGNOSIS — I6523 Occlusion and stenosis of bilateral carotid arteries: Secondary | ICD-10-CM | POA: Insufficient documentation

## 2019-01-25 DIAGNOSIS — Z91040 Latex allergy status: Secondary | ICD-10-CM | POA: Insufficient documentation

## 2019-01-25 DIAGNOSIS — Z7901 Long term (current) use of anticoagulants: Secondary | ICD-10-CM | POA: Insufficient documentation

## 2019-01-25 DIAGNOSIS — I340 Nonrheumatic mitral (valve) insufficiency: Secondary | ICD-10-CM | POA: Insufficient documentation

## 2019-01-25 DIAGNOSIS — I4819 Other persistent atrial fibrillation: Secondary | ICD-10-CM

## 2019-01-25 DIAGNOSIS — I4510 Unspecified right bundle-branch block: Secondary | ICD-10-CM | POA: Insufficient documentation

## 2019-01-25 DIAGNOSIS — I428 Other cardiomyopathies: Secondary | ICD-10-CM | POA: Insufficient documentation

## 2019-01-25 HISTORY — PX: CARDIOVERSION: SHX1299

## 2019-01-25 SURGERY — CARDIOVERSION
Anesthesia: General

## 2019-01-25 MED ORDER — HYDROCORTISONE 1 % EX CREA: 1.0000 "application " | TOPICAL_CREAM | Freq: Three times a day (TID) | CUTANEOUS | Status: DC | PRN

## 2019-01-25 MED ORDER — SODIUM CHLORIDE 0.9 % IV SOLN
INTRAVENOUS | Status: DC | PRN
  Administered 2019-01-25: 08:00:00 via INTRAVENOUS

## 2019-01-25 NOTE — H&P (Signed)
   INTERVAL PROCEDURE H&P  History and Physical Interval Note:  01/25/2019 8:21 AM  Ashley [name] has presented today for their planned procedure. The various methods of treatment have been discussed with the patient and family. After consideration of risks, benefits and other options for treatment, the patient has consented to the procedure.  The patients' outpatient history has been reviewed, patient examined, and no change in status from most recent office note within the past 30 days. I have reviewed the patients' chart and labs and will proceed as planned. Questions were answered to the patient's satisfaction.   [name], MD, [facility], Echo Director of the Advanced Lipid Disorders &  Cardiovascular Risk Reduction Clinic Diplomate of the American Board of Clinical Lipidology Attending Cardiologist  Direct Dial: [phone]  Fax: [phone]  Website:  [email or web address]  Ashley [name] 01/25/2019, 8:21 AM

## 2019-01-25 NOTE — Anesthesia Preprocedure Evaluation (Signed)
Anesthesia Evaluation  Patient identified by MRN, date of birth, ID band Patient awake    Reviewed: Allergy & Precautions, NPO status , Patient's Chart, lab work & pertinent test results  Airway Mallampati: II  TM Distance: >3 FB Neck ROM: Full    Dental  (+) Edentulous Upper, Edentulous Lower   Pulmonary former smoker,    breath sounds clear to auscultation       Cardiovascular hypertension,  Rhythm:Irregular Rate:Normal     Neuro/Psych    GI/Hepatic   Endo/Other    Renal/GU      Musculoskeletal   Abdominal   Peds  Hematology   Anesthesia Other Findings   Reproductive/Obstetrics                             Anesthesia Physical Anesthesia Plan  ASA: III  Anesthesia Plan: General   Post-op Pain Management:    Induction: Intravenous  PONV Risk Score and Plan:   Airway Management Planned: Mask  Additional Equipment:   Intra-op Plan:   Post-operative Plan:   Informed Consent: I have reviewed the patients History and Physical, chart, labs and discussed the procedure including the risks, benefits and alternatives for the proposed anesthesia with the patient or authorized representative who has indicated his/her understanding and acceptance.       Plan Discussed with: Anesthesiologist and CRNA  Anesthesia Plan Comments:         Anesthesia Quick Evaluation

## 2019-01-25 NOTE — Anesthesia Procedure Notes (Signed)
Procedure Name: General with mask airway Date/Time: 01/25/2019 8:25 AM Performed by: Wilburn Cornelia, CRNA Pre-anesthesia Checklist: Patient identified, Emergency Drugs available, Suction available, Patient being monitored and Timeout performed Patient Re-evaluated:Patient Re-evaluated prior to induction Oxygen Delivery Method: Ambu bag Preoxygenation: Pre-oxygenation with 100% oxygen Induction Type: IV induction Ventilation: Mask ventilation without difficulty Placement Confirmation: positive ETCO2 and breath sounds checked- equal and bilateral Dental Injury: Teeth and Oropharynx as per pre-operative assessment

## 2019-01-25 NOTE — Transfer of Care (Signed)
Immediate Anesthesia Transfer of Care Note  Patient: [place]  Procedure(s) Performed: CARDIOVERSION (N/A )  Patient Location: Endoscopy Unit  Anesthesia Type:General  Level of Consciousness: awake, alert  and oriented  Airway & Oxygen Therapy: Patient Spontanous Breathing  Post-op Assessment: Report given to RN and Post -op Vital signs reviewed and stable  Post vital signs: Reviewed and stable  Last Vitals:  Vitals Value Taken Time  BP    Temp    Pulse    Resp    SpO2      Last Pain:  Vitals:   01/25/19 0746  TempSrc: Temporal  PainSc: 0-No pain         Complications: No apparent anesthesia complications

## 2019-01-25 NOTE — Discharge Instructions (Signed)
Electrical Cardioversion, Care After °This sheet gives you information about how to care for yourself after your procedure. Your health care provider may also give you more specific instructions. If you have problems or questions, contact your health care provider. °What can I expect after the procedure? °After the procedure, it is common to have: °· Some redness on the skin where the shocks were given. °Follow these instructions at home: ° °· Do not drive for 24 hours if you were given a medicine to help you relax (sedative). °· Take over-the-counter and prescription medicines only as told by your health care provider. °· Ask your health care provider how to check your pulse. Check it often. °· Rest for 48 hours after the procedure or as told by your health care provider. °· Avoid or limit your caffeine use as told by your health care provider. °Contact a health care provider if: °· You feel like your heart is beating too quickly or your pulse is not regular. °· You have a serious muscle cramp that does not go away. °Get help right away if: ° °· You have discomfort in your chest. °· You are dizzy or you feel faint. °· You have trouble breathing or you are short of breath. °· Your speech is slurred. °· You have trouble moving an arm or leg on one side of your body. °· Your fingers or toes turn cold or blue. °This information is not intended to replace advice given to you by your health care provider. Make sure you discuss any questions you have with your health care provider. °Document Released: 05/29/2013 Document Revised: 03/11/2016 Document Reviewed: 02/12/2016 °Elsevier Interactive Patient Education © 2019 Elsevier Inc. ° °

## 2019-01-25 NOTE — Anesthesia Postprocedure Evaluation (Signed)
Anesthesia Post Note  Patient: [place]  Procedure(s) Performed: CARDIOVERSION (N/A )     Patient location during evaluation: Endoscopy Anesthesia Type: General Level of consciousness: awake and alert Pain management: pain level controlled Vital Signs Assessment: post-procedure vital signs reviewed and stable Respiratory status: spontaneous breathing, nonlabored ventilation, respiratory function stable and patient connected to nasal cannula oxygen Cardiovascular status: blood pressure returned to baseline and stable Postop Assessment: no apparent nausea or vomiting Anesthetic complications: no    Last Vitals:  Vitals:   01/25/19 0746 01/25/19 0835  BP: (!) 170/94 (!) 159/59  Pulse: 75   Resp: (!) 21 17  Temp: 36.6 C 37.1 C  SpO2: 99% 99%    Last Pain:  Vitals:   01/25/19 0835  TempSrc: Oral  PainSc: 0-No pain                 Donita Newland COKER

## 2019-01-25 NOTE — CV Procedure (Signed)
   CARDIOVERSION NOTE  Procedure: Electrical Cardioversion Indications:  Atrial Fibrillation  Procedure Details:  Consent: Risks of procedure as well as the alternatives and risks of each were explained to the (patient/caregiver).  Consent for procedure obtained.    Time Out: Verified patient identification, verified procedure, site/side was marked, verified correct patient position, special equipment/implants available, medications/allergies/relevent history reviewed, required imaging and test results available.  Performed  Patient placed on cardiac monitor, pulse oximetry, supplemental oxygen as necessary.  Sedation given: propofol per anesthesia Pacer pads placed anterior and posterior chest.  Cardioverted 1 time(s).  Cardioverted at 150J biphasic.  Impression: Findings: Post procedure EKG shows: NSR Complications: None Patient did tolerate procedure well.  Plan: 1. Successful DCCV to NSR with a single 150J biphasic shock.  Time Spent Directly with the Patient:  30 minutes   [name], MD, [facility], [place] Director of the Advanced Lipid Disorders &  Cardiovascular Risk Reduction Clinic Diplomate of the American Board of Clinical Lipidology Attending Cardiologist  Direct Dial: [phone]  Fax: [phone]  Website:  [email or web address] 01/25/2019, 8:35 AM

## 2019-01-27 ENCOUNTER — Encounter (HOSPITAL_COMMUNITY): Payer: Self-pay | Admitting: Internal Medicine

## 2019-02-11 ENCOUNTER — Telehealth: Payer: Self-pay | Admitting: Internal Medicine

## 2019-02-11 NOTE — Telephone Encounter (Signed)
Patient is again requesting samples of eliquis. Per last phone notes, patient was given one month supply and it is noted that post cardioversion she may be able to switch back to warfarin. Please advise. Thanks, MI

## 2019-02-11 NOTE — Telephone Encounter (Signed)
Appears we agreed to sample through 4 weeks post cardioversion. Spoke to patient. I will put another 2 weeks of samples downstairs for her tomorrow. She will call us when she starts her last box (last 7 days) so we can transition her to warfarin. She is aware that we will overlap by 3 days.  Patient also asking if she is supposed to have a follow up appointment with Dr. Harrington Challenger or PA after cardioversion? Will route to [facility] and Dr Harrington Challenger.

## 2019-02-11 NOTE — Telephone Encounter (Signed)
Yes she will need a follow up with Dr. Harrington Challenger or me in the office with an ECG. Richardson Dopp, PA-C    02/11/2019 4:57 PM

## 2019-02-11 NOTE — Telephone Encounter (Signed)
New Message     Patient calling the office for samples of medication:   1.  What medication and dosage are you requesting samples for? Eliquis 5mg  2x daily   2.  Are you currently out of this medication? Pt has enough until Wednesday

## 2019-02-12 NOTE — Telephone Encounter (Signed)
Lvm on patient phone to call back to make a post cardioversion appt with Ross/ or [place]

## 2019-02-18 ENCOUNTER — Telehealth: Payer: Self-pay | Admitting: Physician Assistant

## 2019-02-18 NOTE — Telephone Encounter (Signed)

## 2019-02-19 ENCOUNTER — Encounter: Payer: Self-pay | Admitting: Physician Assistant

## 2019-02-19 ENCOUNTER — Ambulatory Visit (INDEPENDENT_AMBULATORY_CARE_PROVIDER_SITE_OTHER): Payer: Self-pay | Admitting: Physician Assistant

## 2019-02-19 ENCOUNTER — Other Ambulatory Visit: Payer: Self-pay

## 2019-02-19 VITALS — BP 130/68 | HR 58 | Ht 68.5 in | Wt 204.0 lb

## 2019-02-19 DIAGNOSIS — I2510 Atherosclerotic heart disease of native coronary artery without angina pectoris: Secondary | ICD-10-CM

## 2019-02-19 DIAGNOSIS — Z9889 Other specified postprocedural states: Secondary | ICD-10-CM

## 2019-02-19 DIAGNOSIS — R001 Bradycardia, unspecified: Secondary | ICD-10-CM

## 2019-02-19 DIAGNOSIS — I4819 Other persistent atrial fibrillation: Secondary | ICD-10-CM

## 2019-02-19 DIAGNOSIS — I5022 Chronic systolic (congestive) heart failure: Secondary | ICD-10-CM

## 2019-02-19 MED ORDER — DIGOXIN 250 MCG PO TABS: 0.1250 mg | ORAL_TABLET | Freq: Every day | ORAL | 3 refills | Status: DC

## 2019-02-19 NOTE — Progress Notes (Signed)
Cardiology Office Note:    Date:  02/19/2019   ID:  Shary Decamp Hilario, DOB [birth year], MRN [id number]  PCP:  Patient, No Pcp Per  Cardiologist:  Dorris Carnes, MD  Electrophysiologist:  None   Referring MD: No ref. provider found   Chief Complaint  Patient presents with  . Follow-up    s/p DCCV for AFib    History of Present Illness:    [place] is a 53 y.o. female with:  Coronary artery disease  Mitral valve prolapse with severe mitral regurgitation  Status post CABG plus mitral valve repair in March 8416  Systolic CHF 2/2 Dilated cardiomyopathy w/ EF 35-40 >> improved to normal  EF down to 30% in setting of AFib/Flutter w/ RVR 10/2018  History of CVA  Admitted to [facility] in [place], [name] in 2017 w/ acute L parietal CVA tx with tPA  Event monitor in 2017: no AFib  Atrial Flutter  CHADS2-VASc=6 (CHF, HTN, CVA, CAD, female) >> Apixaban   Hypertension  Hyperlipidemia  Tobacco use  Ms. Creger was noted to be in AFlutter w/ RVR in 10/2018.  She was started on anticoagulation and rate controlling drugs with an eye towards cardioversion.  However, management was delayed due to restrictions imposed by COVID-19.  A follow up echo demonstrated worsening LV function with an EF of 30.  She was ultimately seen by Dr. Harrington Challenger in person on 01/21/2019 and set up for cardioversion.  This was performed on 01/25/2019 with restoration of NSR.  Of note, she was given samples of Apixaban to get her 4 weeks post DCCV.  She is to transition to Warfarin after this (due to cost).   She is here alone today.  She felt better after the DCCV.  She has continued to feel better.  She has not had chest pain, significant shortness of breath, paroxysmal nocturnal dyspnea, syncope, near syncope.  She has some ankle edema when she eats salty foods. She has not had any bleeding issues.    Prior CV studies:   The following studies were reviewed today:  Long Term Monitor (3-14 day) Atrail  fibrillation  71 to 145 bpm   Average HR 107 bpm     Occassional PVC, longest burst NSVT 5 beats  24 Hr Holter 11/07/2018 Atypical atrial flutter   44 to 126 bpm  Average HR 101 bpm Longest pause 1.8 seconds    Echo 11/01/2018 EF 30, diffuse HK, status post mitral valve repair with trivial MR, mean gradient 7, mild RAE, severe LAE, PASP 35  Event monitor 07/21/2016 SR with occasional PVCs  These PVCs were not sensed.     Carotid 11/16 1-39% bilateral ICA stenosis. - FU prn   Echo 4/13 Mild LVH, EF 55-60%, no RWMA, MV repair ok with no MS/MR, mod LAE, mild RVE, mild RAE, PASP 34 mmHg   Past Medical History:  Diagnosis Date  . Abdominal pain    ? chronic cholecystitis; admxn to [facility] 9/12 (CT, USN, HIDA done)  . Cardiomyopathy    echo 9/12: mild LVH, EF 35-40%, mod MR (difficult to judge /post leaflet restricted), mod BAE, mod RVE, mod TR   . Cardiomyopathy ([place])    a. echo 9/12: mild LVH, EF 35-40%, mod MR (difficult to judge /post leaflet restricted), mod BAE, mod RVE, mod TR. b. Echo 11/2011: mild LVH, EF 55-60%, s/p MV repair w/o sig MR/MS, mildly dilated RV/RA, PASP 14mmHg. // AFL dx 10/2018 - Echo 10/2018: EF 30, diff  HK, s/p MV repair with trivial MR, mean MV 7 but not c/w significant mitral stenosis, mild RAE, severe LAE, mod reduced RVSF   . Carotid artery disease ([place])    a. Duplex 06/2015: 50% RECA, 1-39% BICA.  [name] Coronary artery disease    a. s/p CABG 10/2011 - LIMA-LAD, SVG-PDA, SVG-OM, SVG-diagonal.  . Dyslipidemia   . Essential hypertension   . Hemifacial spasm   . History of Doppler ultrasound    a. Carotid US [facility] in [place], [name]) 7/17: no hemodynamically significant ICA stenosis  . Mitral regurgitation    a. s/p MV repair 10/2011 - on Coumadin for 3 months afterwards then d/c'd by surgery.  . Recurrent upper respiratory infection (URI)   . 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 Stroke [facility])    a. age 39 - in setting of cigs and  OCPs. //  b. s/p acute L parietal CVA >> tPA - [facility] in [place], [name]  . Tobacco abuse    Surgical Hx: The patient  has a past surgical history that includes Tonsillectomy; Tubal ligation; Cesarean section; cesarean section; TEE without cardioversion (06/30/2011); Mitral valve repair (10/21/2011); Coronary artery bypass graft (10/21/2011); Cardiac catheterization; Dental surgery; and Cardioversion (N/A, 01/25/2019).   Current Medications: Current Meds  Medication Sig  . apixaban (ELIQUIS) 5 MG TABS tablet Take 1 tablet (5 mg total) by mouth 2 (two) times daily.  [name] atorvastatin (LIPITOR) 80 MG tablet Take 1 tablet (80 mg total) by mouth daily at 6 PM.  . botulinum toxin Type A (BOTOX) 100 units SOLR injection Inject 100 Units into the muscle every 3 (three) months.  . carvedilol (COREG) 25 MG tablet Take 1.5 tablets (37.5 mg total) by mouth 2 (two) times daily.  . digoxin (LANOXIN) 0.25 MG tablet Take 0.5 tablets (0.125 mg total) by mouth daily.  [name] diltiazem (CARDIZEM CD) 120 MG 24 hr capsule Take 1 capsule (120 mg total) by mouth daily.  . diphenhydrAMINE (BENADRYL) 25 MG tablet Take 25 mg by mouth daily as needed for allergies.  . famotidine (PEPCID) 20 MG tablet Take 20 mg by mouth daily as needed. For indigestion  . furosemide (LASIX) 20 MG tablet TAKE 1 TABLET BY MOUTH AS NEEDED FOR  SWELLING (Patient taking differently: Take 20 mg by mouth daily as needed for fluid. )  . lisinopril (ZESTRIL) 20 MG tablet Take 10 mg by mouth daily.  . vitamin B-12 (CYANOCOBALAMIN) 1000 MCG tablet Take 1 tablet (1,000 mcg total) by mouth daily.  . vitamin C (ASCORBIC ACID) 500 MG tablet Take 500 mg by mouth 2 (two) times daily.  . [DISCONTINUED] digoxin (LANOXIN) 0.25 MG tablet Take 1 tablet (0.25 mg total) by mouth daily.   Current Facility-Administered Medications for the 02/19/19 encounter (Office Visit) with Richardson Dopp T, PA-C  Medication  . incobotulinumtoxinA (XEOMIN) 50 units injection 50  Units  . incobotulinumtoxinA (XEOMIN) 50 units injection 50 Units  . incobotulinumtoxinA (XEOMIN) 50 units injection 50 Units     Allergies:   Azithromycin and Latex   Social History   Tobacco Use  . Smoking status: Former Smoker    Packs/day: 1.00    Years: 30.00    Pack years: 30.00    Quit date: 05/07/2011    Years since quitting: 7.7  . Smokeless tobacco: Never Used  Substance Use Topics  . Alcohol use: Yes    Alcohol/week: 1.0 standard drinks  Types: 1 Glasses of wine per week    Comment: 1 glass of red wine nightly  . Drug use: No     Family Hx: The patient's family history includes COPD in her father; Heart disease in her father; Hypertension in her mother; Stroke in her mother. There is no history of Anesthesia problems, Hypotension, Malignant hyperthermia, or Pseudochol deficiency.  ROS:   Please see the history of present illness.    ROS All other systems reviewed and are negative.   EKGs/Labs/Other Test Reviewed:    EKG:  EKG is  ordered today.  The ekg ordered today demonstrates AFib, HR 58, PVCs vs aberrant conduction, diffuse down sloping ST segments (Dig effect), QTc 382  Recent Labs: 10/22/2018: TSH 1.600 01/21/2019: BUN 9; Creatinine, Ser 0.92; Hemoglobin 11.7; Platelets 187; Potassium 4.0; Sodium 143   Recent Lipid Panel Lab Results  Component Value Date/Time   CHOL 114 11/16/2017 08:38 AM   TRIG 68 11/16/2017 08:38 AM   HDL 43 11/16/2017 08:38 AM   CHOLHDL 2.7 11/16/2017 08:38 AM   CHOLHDL 3.8 07/01/2016 09:39 AM   LDLCALC 57 11/16/2017 08:38 AM    Physical Exam:    VS:  BP 130/68   Pulse (!) 58   Ht 5' 8.5" (1.74 m)   Wt 204 lb (92.5 kg)   BMI 30.57 kg/m     Wt Readings from Last 3 Encounters:  02/19/19 204 lb (92.5 kg)  01/25/19 211 lb (95.7 kg)  01/21/19 211 lb 12.8 oz (96.1 kg)     Physical Exam  Constitutional: She is oriented to person, place, and time. She appears well-developed and well-nourished. No distress.  HENT:  Head:  Normocephalic and atraumatic.  Eyes: No scleral icterus.  Neck: No JVD present. No thyromegaly present.  Cardiovascular: Normal rate. An irregularly irregular rhythm present.  Murmur heard.  Systolic murmur is present with a grade of 1/6 at the upper left sternal border. Pulmonary/Chest: Effort normal and breath sounds normal. She has no rales.  Abdominal: Soft. There is no hepatomegaly.  Musculoskeletal:        General: No edema.  Lymphadenopathy:    She has no cervical adenopathy.  Neurological: She is alert and oriented to person, place, and time.  Skin: Skin is warm and dry.  Psychiatric: She has a normal mood and affect.    ASSESSMENT & PLAN:    1. Persistent atrial fibrillation 2. Bradycardia She has had ERAF.  Her rate is actually slow now.  This appears to be an effect of digoxin.  I reviewed her ECG today with Dr. Lovena Le (attending MD).  Luckily, she is not symptomatic.  She actually feels better.  This may be because she is better rate controlled.    -Decrease Digoxin to 0.125 mg once daily   -ECG in 1 week  -Continue Apixaban; we will try to get her assistance for Xarelto  -If no financial assistance for Xarelto, we will need to change her to Coumadin   3. Chronic systolic CHF (congestive heart failure) ([place]) EF 30 by Echo in March 2020.  She is NYHA 2.  Volume is stable.  Continue beta-blocker, ACE inhibitor.  Plan repeat echo in ~ 4 weeks.  If she continues to have improved symptoms and controlled HR and her EF returns to normal, we can likely pursue rate control.  If her EF remains down, we may need to consider Dofetilide for rhythm control.    -Schedule limited echo to recheck EF in 4 weeks   -  Follow up with me or Dr. Harrington Challenger in 6 weeks.   4. Coronary artery disease involving native coronary artery of native heart without angina pectoris Hx of CABG in 2013.  No anginal symptoms.  She is not on ASA as she is on Apixaban. Continue beta-blocker, statin, ACE inhibitor.     5. S/P mitral valve repair Continue SBE prophylaxis.  MV repair well functioning on echo in 10/2018.     Dispo:  Return in about 6 weeks (around 04/02/2019) for Routine Follow Up, w/ Dr. Harrington Challenger, or Richardson Dopp, PA-C.   Medication Adjustments/Labs and Tests Ordered: Current medicines are reviewed at length with the patient today.  Concerns regarding medicines are outlined above.  Tests Ordered: Orders Placed This Encounter  Procedures  . EKG 12-Lead  . EKG 12-Lead  . ECHOCARDIOGRAM COMPLETE   Medication Changes: Meds ordered this encounter  Medications  . digoxin (LANOXIN) 0.25 MG tablet    Sig: Take 0.5 tablets (0.125 mg total) by mouth daily.    Dispense:  90 tablet    Refill:  3    Signed, Richardson Dopp, PA-C  02/19/2019 5:38 PM    [facility] [place], [place], [place]  [address] Phone: [phone]; Fax: [phone]

## 2019-02-19 NOTE — Patient Instructions (Signed)
Medication Instructions:   Start taking  Digoxin  0.125 mg once a day   If you need a refill on your cardiac medications before your next appointment, please call your pharmacy.   Lab work: NONE ORDERED  TODAY   If you have labs (blood work) drawn today and your tests are completely normal, you will receive your results only by: [name] MyChart Message (if you have MyChart) OR . A paper copy in the mail If you have any lab test that is abnormal or we need to change your treatment, we will call you to review the results.  Testing/Procedures: Your physician has requested that you have an echocardiogram. Echocardiography is a painless test that uses sound waves to create images of your heart. It provides your doctor with information about the size and shape of your heart and how well your heart's chambers and valves are working. This procedure takes approximately one hour. There are no restrictions for this procedure.  Follow-Up: At [facility], you and your health needs are our priority.  As part of our continuing mission to provide you with exceptional heart care, we have created designated Provider Care Teams.  These Care Teams include your primary Cardiologist (physician) and Advanced Practice Providers (APPs -  Physician Assistants and Nurse Practitioners) who all work together to provide you with the care you need, when you need it. You will need a follow up appointment in:  6 weeks. IN OFFICE  Please call our office 2 months in advance to schedule this appointment.  You may see Dorris Carnes, MD or one of the following Advanced Practice Providers on your designated Care Team: Richardson Dopp, PA-C [place], [place] . Daune Perch, NP  Any Other Special Instructions Will Be Listed Below (If Applicable).

## 2019-02-26 ENCOUNTER — Other Ambulatory Visit: Payer: Self-pay

## 2019-02-26 ENCOUNTER — Ambulatory Visit (INDEPENDENT_AMBULATORY_CARE_PROVIDER_SITE_OTHER): Payer: Self-pay

## 2019-02-26 VITALS — BP 132/68 | HR 43 | Ht 68.5 in | Wt 204.0 lb

## 2019-02-26 DIAGNOSIS — R001 Bradycardia, unspecified: Secondary | ICD-10-CM

## 2019-02-26 NOTE — Progress Notes (Signed)
Agree. ECG with junctional rhythm.  Patient on Coreg 37.5 mg twice daily, Diltiazem 120 mg once daily and Digoxin 0.125 mg once daily.  Reviewed with Dr. Curt Bears (attending MD).   PLAN:  1. DC Diltiazem 2. Repeat ECG in 3 days. Richardson Dopp, PA-C    02/26/2019 3:07 PM

## 2019-02-26 NOTE — Patient Instructions (Addendum)
Medication Instructions:  Stop Diltazem  Labwork: None ordered.  Testing/Procedures: None ordered.  Follow-Up:  RN visit Friday July 10 @ 12:15pm  Ashley [name] August 18 at 11:15  Any Other Special Instructions Will Be Listed Below (If Applicable).     If you need a refill on your cardiac medications before your next appointment, please call your pharmacy.

## 2019-02-26 NOTE — Progress Notes (Signed)
Pt presents today for follow up after a medication change. Pt has no c/o SOB, CP, syncope, or dizziness.  Richardson Dopp and Dr Curt Bears, DOD, reviewed EKG. Pt is to discontinue diltiazem.   Pt is to follow up for a nurse visit on Friday or Monday. Follow up with Richardson Dopp in August as planned.

## 2019-03-01 ENCOUNTER — Other Ambulatory Visit: Payer: Self-pay

## 2019-03-01 ENCOUNTER — Ambulatory Visit (INDEPENDENT_AMBULATORY_CARE_PROVIDER_SITE_OTHER): Payer: Self-pay

## 2019-03-01 VITALS — BP 130/60 | HR 38 | Resp 13

## 2019-03-01 DIAGNOSIS — I4819 Other persistent atrial fibrillation: Secondary | ICD-10-CM

## 2019-03-01 DIAGNOSIS — R001 Bradycardia, unspecified: Secondary | ICD-10-CM

## 2019-03-01 DIAGNOSIS — I5022 Chronic systolic (congestive) heart failure: Secondary | ICD-10-CM

## 2019-03-01 NOTE — Patient Instructions (Addendum)
Medication Instructions:  STOP Digoxin  If you need a refill on your cardiac medications before your next appointment, please call your pharmacy.   Lab work:  If you have labs (blood work) drawn today and your tests are completely normal, you will receive your results only by: [name] MyChart Message (if you have MyChart) OR . A paper copy in the mail If you have any lab test that is abnormal or we need to change your treatment, we will call you to review the results.  Testing/Procedures:   Follow-Up: NURSE VISIT early next week for repeat EKG 03/05/19 at 12:15pm.  At [facility], you and your health needs are our priority.  As part of our continuing mission to provide you with exceptional heart care, we have created designated Provider Care Teams.  These Care Teams include your primary Cardiologist (physician) and Advanced Practice Providers (APPs -  Physician Assistants and Nurse Practitioners) who all work together to provide you with the care you need, when you need it.   Any Other Special Instructions Will Be Listed Below (If Applicable).

## 2019-03-01 NOTE — Progress Notes (Signed)
Pt came in for nurse visit today for EKG.. Pt has D/C diltiazem since Tues 02/26/19... secondary to junctional bradycardia.  Pt has a couple of episodes of light headedness but no dizziness, sob.   EKG today reveals Junctional bradycardia with R38... BP 130/60.   Per Dr. Tamala Julian DOD... pt to D/C Digoxin and return early nest week for repeat EKG with nurse... pt declined Monday 03/04/19 appt so she is scheduled for 03/05/19.[name] she will call if any changes with her symptoms.

## 2019-03-05 ENCOUNTER — Ambulatory Visit (HOSPITAL_COMMUNITY): Payer: Self-pay | Attending: Cardiology

## 2019-03-05 ENCOUNTER — Other Ambulatory Visit: Payer: Self-pay

## 2019-03-05 ENCOUNTER — Ambulatory Visit (INDEPENDENT_AMBULATORY_CARE_PROVIDER_SITE_OTHER): Payer: Self-pay

## 2019-03-05 ENCOUNTER — Other Ambulatory Visit: Payer: Self-pay | Admitting: Physician Assistant

## 2019-03-05 VITALS — BP 140/70 | HR 83 | Resp 13

## 2019-03-05 DIAGNOSIS — I5022 Chronic systolic (congestive) heart failure: Secondary | ICD-10-CM

## 2019-03-05 DIAGNOSIS — R001 Bradycardia, unspecified: Secondary | ICD-10-CM

## 2019-03-05 DIAGNOSIS — I4819 Other persistent atrial fibrillation: Secondary | ICD-10-CM | POA: Insufficient documentation

## 2019-03-05 NOTE — Progress Notes (Signed)
Reviewed for Ashley Dopp, PA Agree with plan. Patient has follow up with [name] next month.  Call for any problems in the interim.   Burtis Junes, RN, [place] [address] [place], [place]  [address] [phone]

## 2019-03-05 NOTE — Patient Instructions (Signed)
Medication Instructions:  NO CHANGES  If you need a refill on your cardiac medications before your next appointment, please call your pharmacy.   Lab work: None ordered If you have labs (blood work) drawn today and your tests are completely normal, you will receive your results only by: [name] MyChart Message (if you have MyChart) OR . A paper copy in the mail If you have any lab test that is abnormal or we need to change your treatment, we will call you to review the results.  Testing/Procedures: None ordered  Follow-Up: ASAP with Dr Curt Bears... can be VIRTUAL [phone] (cell)   . At [facility], you and your health needs are our priority.  As part of our continuing mission to provide you with exceptional heart care, we have created designated Provider Care Teams.  These Care Teams include your primary Cardiologist (physician) and Advanced Practice Providers (APPs -  Physician Assistants and Nurse Practitioners) who all work together to provide you with the care you need, when you need it.  Any Other Special Instructions Will Be Listed Below (If Applicable).

## 2019-03-05 NOTE — Progress Notes (Signed)
Nurse Visit today for repeat EKG secondary to bradycardia... pt had d/c Digoxin 03/01/19 and reports that she has been feelng well but a few "dizzy" spells over the weekend. Pt denies palpitations, cheat pain, SOB..   EKG today AFIB R83... per Dr. Acie Fredrickson.... no changes and to forward to Isabella for review and plan.   Pt advised to continue to monitor her BP and HR.Marland KitchenMarland KitchenI showed her how to take a manual pulse...   Pt strongly urged to continue her meds especially her Eliquis.   Pt verbalized understanding and agreed.

## 2019-03-06 ENCOUNTER — Telehealth: Payer: Self-pay | Admitting: Internal Medicine

## 2019-03-06 NOTE — Telephone Encounter (Signed)
New Message    Patient waiting for your call please call patient back.

## 2019-03-18 ENCOUNTER — Telehealth: Payer: Self-pay | Admitting: Internal Medicine

## 2019-03-18 MED ORDER — WARFARIN SODIUM 5 MG PO TABS: 5.0000 mg | ORAL_TABLET | Freq: Every day | ORAL | 1 refills | Status: DC

## 2019-03-18 NOTE — Telephone Encounter (Signed)
Patient calling the office for samples of medication:   1.  What medication and dosage are you requesting samples for? apixaban (ELIQUIS) 5 MG TABS tablet  2.  Are you currently out of this medication? Patient has enough to last until Wednesday.

## 2019-03-18 NOTE — Telephone Encounter (Signed)
Pt calling has enough Eliquis to last until Wednesday, per previous note pt was to call Melissa back to be transitioned off Eliquis and onto Warfarin per 02/11/19 phone note.

## 2019-03-18 NOTE — Telephone Encounter (Signed)
Patient is requesting more samples. Per previous notes patient was going to switch back to warfarin post cardioversion. Please advise. Thanks, MI

## 2019-03-18 NOTE — Telephone Encounter (Signed)
Will start warfarin 5mg  daily tonight. Patient will continue her supply of Eliquis until she runs out (thurs morning). Will check INR on Friday morning.

## 2019-03-20 ENCOUNTER — Telehealth: Payer: Self-pay | Admitting: *Deleted

## 2019-03-20 NOTE — Telephone Encounter (Signed)

## 2019-03-20 NOTE — Telephone Encounter (Signed)
Left message for pt to call back regarding Covid 19 Prescreen .

## 2019-03-22 ENCOUNTER — Ambulatory Visit (INDEPENDENT_AMBULATORY_CARE_PROVIDER_SITE_OTHER): Payer: Self-pay | Admitting: *Deleted

## 2019-03-22 ENCOUNTER — Other Ambulatory Visit: Payer: Self-pay

## 2019-03-22 DIAGNOSIS — Z5181 Encounter for therapeutic drug level monitoring: Secondary | ICD-10-CM | POA: Insufficient documentation

## 2019-03-22 DIAGNOSIS — Z8673 Personal history of transient ischemic attack (TIA), and cerebral infarction without residual deficits: Secondary | ICD-10-CM

## 2019-03-22 DIAGNOSIS — I4891 Unspecified atrial fibrillation: Secondary | ICD-10-CM

## 2019-03-22 LAB — POCT INR: INR: 1.9 — AB (ref 2.0–3.0)

## 2019-03-22 NOTE — Patient Instructions (Addendum)
Description    Continue taking 1 tablet daily. Recheck INR in 1 week.  Call coumadin Clinic [phone] if you have any changes in your medication or upcoming procedures.   A full discussion of the nature of anticoagulants has been carried out.  A benefit risk analysis has been presented to the patient, so that they understand the justification for choosing anticoagulation at this time. The need for frequent and regular monitoring, precise dosage adjustment and compliance is stressed.  Side effects of potential bleeding are discussed.  The patient should avoid any OTC items containing aspirin or ibuprofen, and should avoid great swings in general diet.  Avoid alcohol consumption.  Call if any signs of abnormal bleeding.

## 2019-03-27 ENCOUNTER — Other Ambulatory Visit: Payer: Self-pay | Admitting: Internal Medicine

## 2019-03-28 ENCOUNTER — Other Ambulatory Visit: Payer: Self-pay

## 2019-03-28 ENCOUNTER — Ambulatory Visit (INDEPENDENT_AMBULATORY_CARE_PROVIDER_SITE_OTHER): Payer: Self-pay | Admitting: Pharmacist

## 2019-03-28 DIAGNOSIS — Z5181 Encounter for therapeutic drug level monitoring: Secondary | ICD-10-CM

## 2019-03-28 DIAGNOSIS — I4891 Unspecified atrial fibrillation: Secondary | ICD-10-CM

## 2019-03-28 DIAGNOSIS — Z8673 Personal history of transient ischemic attack (TIA), and cerebral infarction without residual deficits: Secondary | ICD-10-CM

## 2019-03-28 LAB — POCT INR: INR: 4.7 — AB (ref 2.0–3.0)

## 2019-03-28 NOTE — Patient Instructions (Signed)
Description   Skip your Coumadin tomorrow and Saturday, then start taking taking 1 tablet daily except 1/2 tablet on Wednesdays. Recheck INR in 10 days since you will be out of town next week - same day as appointment with Richardson Dopp.  Call coumadin Clinic [phone] if you have any changes in your medication or upcoming procedures.

## 2019-04-08 NOTE — Progress Notes (Signed)
Cardiology Office Note:    Date:  04/09/2019   ID:  Ashley [name], DOB 03/16/1966, MRN [id number]  PCP:  Patient, No Pcp Per  Cardiologist:  Dorris Carnes, MD  Electrophysiologist:  None   Referring MD: No ref. provider found   Chief Complaint  Patient presents with  . Follow-up    Atrial fibrillation    History of Present Illness:    Ashley [name] is a 53 y.o. female with:  Coronary artery disease  Mitral valve prolapse with severe mitral regurgitation  Status post CABG plus mitral valve repair in March 6144  Systolic CHF 2/2 Dilated cardiomyopathy w/ EF 35-40 >> improved to normal ? EF down to 30% in setting of AFib/Flutter w/ RVR 10/2018  History of CVA ? Admitted to [facility] in [place], [name] in 2017 w/ acute L parietal CVA tx with tPA ? Event monitor in 2017: no AFib  Atrial Flutter ? CHADS2-VASc=6 (CHF, HTN, CVA, CAD, female) >> Apixaban   Hypertension  Hyperlipidemia  Tobacco use  Ashley [name] was noted to be in AFlutter w/ RVR in 10/2018.  Ashley [name] was started on anticoagulation and rate controlling drugs with an eye towards cardioversion.  However, management was delayed due to restrictions imposed by COVID-19.  A follow up echo demonstrated worsening LV function with an EF of 30.  Ashley [name] was ultimately seen by Dr. Harrington Challenger and set up for cardioversion on 01/25/2019 with restoration of NSR. Ashley [name] was last seen 02/19/2019.  Ashley [name] was back in atrial fibrillation and her HR was very slow.  Ashley [name] was asymptomatic.  Her Digoxin was reduced.  A follow up echocardiogram 7/14 showed improved LVF with EF 60-65.  Her HR remained low and Ashley [name] was ultimately taken off of Diltiazem and Digoxin.    Ashley [name] returns for follow-up.  Ashley [name] recently traveled to the beach.  While there, Ashley [name] could tell that her heart rate was significantly elevated.  Ashley [name] has noted more fatigue as well as leg swelling.  Ashley [name] did take furosemide with relief.  Ashley [name] has not had chest discomfort, orthopnea, paroxysmal  nocturnal dyspnea.  Ashley [name] has not had syncope.  Ashley [name] has not had any bleeding.  Ashley [name] did have some bruising after a jar fell on her foot.  Prior CV studies:   The following studies were reviewed today:  Echocardiogram 03/05/2019 EF 60-65, normal RVSF  Long Term Monitor (3-14 day) 12/2018 Atrail fibrillation 71 to 145 bpm Average HR 107 bpm  Occassional PVC, longest burst NSVT 5 beats  24 Hr Holter 11/07/2018 Atypical atrial flutter 44 to 126 bpm Average HR 101 bpm Longest pause 1.8 seconds   Echo 11/01/2018 EF 30, diffuse HK, status post mitral valve repair with trivial MR, mean gradient 7, mild RAE, severe LAE, PASP 35  Event monitor 07/21/2016 SR with occasional PVCs These PVCs were not sensed.  Carotid 11/16 1-39% bilateral ICA stenosis.- FU prn  Echo 4/13 Mild LVH, EF 55-60%, no RWMA, MV repair ok with no MS/MR, mod LAE, mild RVE, mild RAE, PASP 34 mmHg  Past Medical History:  Diagnosis Date  . Abdominal pain    ? chronic cholecystitis; admxn to [facility] 9/12 (CT, USN, HIDA done)  . Cardiomyopathy    echo 9/12: mild LVH, EF 35-40%, mod MR (difficult to judge /post leaflet restricted), mod BAE, mod RVE, mod TR   . Cardiomyopathy ([place])    a. echo 9/12: mild LVH, EF 35-40%, mod MR (difficult to judge /post leaflet restricted), mod BAE, mod  RVE, mod TR. b. Echo 11/2011: mild LVH, EF 55-60%, s/p MV repair w/o sig MR/MS, mildly dilated RV/RA, PASP 51mmHg. // AFL dx 10/2018 - Echo 10/2018: EF 30, diff HK, s/p MV repair with trivial MR, mean MV 7 but not c/w significant mitral stenosis, mild RAE, severe LAE, mod reduced RVSF   . Carotid artery disease ([place])    a. Duplex 06/2015: 50% RECA, 1-39% BICA.  [name] Coronary artery disease    a. s/p CABG 10/2011 - LIMA-LAD, SVG-PDA, SVG-OM, SVG-diagonal.  . Dyslipidemia   . Essential hypertension   . Hemifacial spasm   . History of Doppler ultrasound    a. Carotid [facility] in [place], [name]) 7/17: no hemodynamically  significant ICA stenosis  . Mitral regurgitation    a. s/p MV repair 10/2011 - on Coumadin for 3 months afterwards then d/c'd by surgery.  . Recurrent upper respiratory infection (URI)   . 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 Stroke [facility])    a. age 69 - in setting of cigs and OCPs. //  b. s/p acute L parietal CVA >> tPA - [facility] in [place], [name]  . Tobacco abuse    Surgical Hx: The patient  has a past surgical history that includes Tonsillectomy; Tubal ligation; Cesarean section; cesarean section; TEE without cardioversion (06/30/2011); Mitral valve repair (10/21/2011); Coronary artery bypass graft (10/21/2011); Cardiac catheterization; Dental surgery; and Cardioversion (N/A, 01/25/2019).   Current Medications: Current Meds  Medication Sig  . atorvastatin (LIPITOR) 80 MG tablet Take 1 tablet (80 mg total) by mouth daily at 6 PM.  . botulinum toxin Type A (BOTOX) 100 units SOLR injection Inject 100 Units into the muscle every 3 (three) months.  . diltiazem (CARDIZEM) 120 MG tablet Take 120 mg by mouth daily.  . diphenhydrAMINE (BENADRYL) 25 MG tablet Take 25 mg by mouth daily as needed for allergies.  . famotidine (PEPCID) 20 MG tablet Take 20 mg by mouth daily as needed. For indigestion  . furosemide (LASIX) 20 MG tablet TAKE 1 TABLET BY MOUTH AS NEEDED FOR  SWELLING  . lisinopril (ZESTRIL) 20 MG tablet Take 0.5 tablets (10 mg total) by mouth daily.  . vitamin B-12 (CYANOCOBALAMIN) 1000 MCG tablet Take 1 tablet (1,000 mcg total) by mouth daily.  . vitamin C (ASCORBIC ACID) 500 MG tablet Take 500 mg by mouth 2 (two) times daily.  [name] warfarin (COUMADIN) 5 MG tablet Take 1 tablet (5 mg total) by mouth daily.   Current Facility-Administered Medications for the 04/09/19 encounter (Office Visit) with Richardson Dopp T, PA-C  Medication  . incobotulinumtoxinA (XEOMIN) 50 units injection 50 Units  . incobotulinumtoxinA (XEOMIN) 50 units injection 50 Units  .  incobotulinumtoxinA (XEOMIN) 50 units injection 50 Units     Allergies:   Azithromycin and Latex   Social History   Tobacco Use  . Smoking status: Former Smoker    Packs/day: 1.00    Years: 30.00    Pack years: 30.00    Quit date: 05/07/2011    Years since quitting: 7.9  . Smokeless tobacco: Never Used  Substance Use Topics  . Alcohol use: Yes    Alcohol/week: 1.0 standard drinks    Types: 1 Glasses of wine per week    Comment: 1 glass of red wine nightly  . Drug use: No     Family Hx: The patient's family history includes COPD in her father; Heart disease in her  father; Hypertension in her mother; Stroke in her mother. There is no history of Anesthesia problems, Hypotension, Malignant hyperthermia, or Pseudochol deficiency.  ROS:   Please see the history of present illness.    ROS All other systems reviewed and are negative.   EKGs/Labs/Other Test Reviewed:    EKG:  EKG is  ordered today.  The ekg ordered today demonstrates atrial fibrillation vs A flutter w/ variable AV block, HR 128, right axis deviation  Recent Labs: 10/22/2018: TSH 1.600 01/21/2019: BUN 9; Creatinine, Ser 0.92; Hemoglobin 11.7; Platelets 187; Potassium 4.0; Sodium 143   Recent Lipid Panel Lab Results  Component Value Date/Time   CHOL 114 11/16/2017 08:38 AM   TRIG 68 11/16/2017 08:38 AM   HDL 43 11/16/2017 08:38 AM   CHOLHDL 2.7 11/16/2017 08:38 AM   CHOLHDL 3.8 07/01/2016 09:39 AM   LDLCALC 57 11/16/2017 08:38 AM     Physical Exam:    VS:  Pulse (!) 138   Ht 5' 8.5" (1.74 m)   Wt 210 lb 12.8 oz (95.6 kg)   SpO2 98%   BMI 31.59 kg/m     Wt Readings from Last 3 Encounters:  04/09/19 210 lb 12.8 oz (95.6 kg)  02/26/19 204 lb (92.5 kg)  02/19/19 204 lb (92.5 kg)     Physical Exam  Constitutional: Ashley [name] is oriented to person, place, and time. Ashley [name] appears well-developed and well-nourished. No distress.  HENT:  Head: Normocephalic and atraumatic.  Eyes: No scleral icterus.  Neck: No JVD  present. No thyromegaly present.  Cardiovascular: Normal heart sounds. An irregularly irregular rhythm present. Tachycardia present.  No murmur heard. Pulmonary/Chest: Effort normal and breath sounds normal. Ashley [name] has no rales.  Abdominal: Soft. There is no hepatomegaly.  Musculoskeletal:        General: No edema.  Lymphadenopathy:    Ashley [name] has no cervical adenopathy.  Neurological: Ashley [name] is alert and oriented to person, place, and time.  Skin: Skin is warm and dry.  Psychiatric: Ashley [name] has a normal mood and affect.    ASSESSMENT & PLAN:    1. Atrial fibrillation with RVR ([place]) Ashley [name] failed cardioversion and then had marked bradycardia/junctional rhythm.  This required discontinuation of her diltiazem and her digoxin.  Now Ashley [name] has rapid rate again.  Ashley [name] is symptomatic with rapid rate with recent evidence of volume excess.  I reviewed her case today with Dr. Curt Bears who also spoke to the patient.  Options include PVI ablation for atrial fibrillation versus dofetilide.  Rationale for admission to the hospital with dofetilide was discussed with the patient.  Ashley [name] was provided literature regarding dofetilide as well as PVI ablation.  Ashley [name] would like to discuss this with her family and Ashley [name] will contact us when Ashley [name] is ready to decide.  For now, I will place her back on diltiazem 120 mg daily.  Ashley [name] will be seen back next week for follow-up to ensure that her heart rate is better controlled.  Continue Coumadin.  2. Chronic systolic CHF (congestive heart failure) ([place]) Ashley [name] mainly has evidence of tachycardia induced cardiomyopathy.  Her ejection fraction improved to normal but there is controlled heart rate.  I am concerned that Ashley [name] will have reduced ejection fraction again if her heart remains elevated.  Continue current dose of carvedilol and lisinopril.  We discussed weighing herself daily and when to take Lasix.  3. Coronary artery disease involving native coronary artery of native heart without angina pectoris  History of CABG.  Ashley [name] is not having  any angina.  Ashley [name] is not on aspirin as Ashley [name] is a Warfarin.  Continue statin therapy.  4. S/P mitral valve repair Stable by most recent echocardiograms.  Continue SBE prophylaxis.  5. Essential hypertension Blood pressure is controlled.  Ashley [name] should be able to tolerate the resumption of diltiazem as noted above.   Dispo:  Return in about 1 week (around 04/16/2019) for Close Follow Up, w/ Dr. Harrington Challenger, or Richardson Dopp, PA-C, or PA/NP.   Medication Adjustments/Labs and Tests Ordered: Current medicines are reviewed at length with the patient today.  Concerns regarding medicines are outlined above.  Tests Ordered: Orders Placed This Encounter  Procedures  . EKG 12-Lead   Medication Changes: No orders of the defined types were placed in this encounter.   Signed, Richardson Dopp, PA-C  04/09/2019 4:53 PM    [facility] [address], [place], [place]  [address] Phone: [phone]; Fax: [phone]

## 2019-04-09 ENCOUNTER — Ambulatory Visit (INDEPENDENT_AMBULATORY_CARE_PROVIDER_SITE_OTHER): Payer: Self-pay | Admitting: *Deleted

## 2019-04-09 ENCOUNTER — Other Ambulatory Visit: Payer: Self-pay

## 2019-04-09 ENCOUNTER — Encounter: Payer: Self-pay | Admitting: Physician Assistant

## 2019-04-09 ENCOUNTER — Ambulatory Visit (INDEPENDENT_AMBULATORY_CARE_PROVIDER_SITE_OTHER): Payer: Self-pay | Admitting: Physician Assistant

## 2019-04-09 ENCOUNTER — Telehealth: Payer: Self-pay | Admitting: Internal Medicine

## 2019-04-09 VITALS — BP 130/72 | HR 138 | Ht 68.5 in | Wt 210.8 lb

## 2019-04-09 DIAGNOSIS — I5022 Chronic systolic (congestive) heart failure: Secondary | ICD-10-CM

## 2019-04-09 DIAGNOSIS — I10 Essential (primary) hypertension: Secondary | ICD-10-CM

## 2019-04-09 DIAGNOSIS — Z5181 Encounter for therapeutic drug level monitoring: Secondary | ICD-10-CM

## 2019-04-09 DIAGNOSIS — Z9889 Other specified postprocedural states: Secondary | ICD-10-CM

## 2019-04-09 DIAGNOSIS — I4891 Unspecified atrial fibrillation: Secondary | ICD-10-CM

## 2019-04-09 DIAGNOSIS — Z8673 Personal history of transient ischemic attack (TIA), and cerebral infarction without residual deficits: Secondary | ICD-10-CM

## 2019-04-09 DIAGNOSIS — I2510 Atherosclerotic heart disease of native coronary artery without angina pectoris: Secondary | ICD-10-CM

## 2019-04-09 LAB — POCT INR: INR: 4.3 — AB (ref 2.0–3.0)

## 2019-04-09 NOTE — Patient Instructions (Addendum)
Description   Skip your Coumadin tomorrow and skip your dose on Thursday, then start taking taking 1 tablet daily except 1/2 tablet on Sundays and Wednesdays. Recheck INR in 10 days.  Call coumadin Clinic [phone] if you have any changes in your medication or upcoming procedures.

## 2019-04-09 NOTE — Telephone Encounter (Signed)
New Message   Patient states that she was seen by Richardson Dopp PA-C and was given advice on some procedures that may be a good idea for her to have done based on what is going with her heart health. Patient states that her husband sees Dr. Caryl Comes and she would like to speak with him to get his opinion on the procedures that Richardson Dopp PA-C thought would be beneficial for the patient to have ordered. Please give patient a call back to advise.

## 2019-04-09 NOTE — Patient Instructions (Addendum)
Medication Instructions:  RESTART: Diltiazem 120 mg once a day    If you need a refill on your cardiac medications before your next appointment, please call your pharmacy.   Lab work: None   If you have labs (blood work) drawn today and your tests are completely normal, you will receive your results only by: [name] MyChart Message (if you have MyChart) OR . A paper copy in the mail If you have any lab test that is abnormal or we need to change your treatment, we will call you to review the results.  Testing/Procedures: None   Follow-Up: Follow up in 1 week with Dr. Harrington Challenger or care team  Any Other Special Instructions Will Be Listed Below (If Applicable).  Weigh yourself daily. If your weight goes up 3 pounds in 1 day take Lasix. Call if your shortness breath or swelling gets worse    Cardiac Ablation  Cardiac ablation is a procedure to stop some heart tissue from causing problems. The heart has many electrical connections. Sometimes these connections make the heart beat very fast or irregularly. Removing some problem areas can improve the heart rhythm or make it normal. What happens before the procedure?  Follow instructions from your doctor about what you cannot eat or drink.  Ask your doctor about: ? Changing or stopping your normal medicines. This is important if you take diabetes medicines or blood thinners. ? Taking medicines such as aspirin and ibuprofen. These medicines can thin your blood. Do not take these medicines before your procedure if your doctor tells you not to.  Plan to have someone take you home.  If you will be going home right after the procedure, plan to have someone with you for 24 hours. What happens during the procedure?  To lower your risk of infection: ? Your health care team will wash or sanitize their hands. ? Your skin will be washed with soap. ? Hair may be removed from your neck or groin.  An IV tube will be put into one of your veins.  You will  be given a medicine to help you relax (sedative).  Skin on your neck or groin will be numbed.  A cut (incision) will be made in your neck or groin.  A needle will be put through your cut and into a vein in your neck or groin.  A tube (catheter) will be put into the needle. The tube will be moved to your heart. X-rays (fluoroscopy) will be used to help guide the tube.  Small devices (electrodes) on the tip of the tube will send out electrical currents.  Dye may be put through the tube. This helps your surgeon see your heart.  Electrical energy will be used to scar (ablate) some heart tissue. Your surgeon may use: ? Heat (radiofrequency energy). ? Laser energy. ? Extreme cold (cryoablation).  The tube will be taken out.  Pressure will be held on your cut. This helps stop bleeding.  A bandage (dressing) will be put on your cut. The procedure may vary. What happens after the procedure?  You will be monitored until your medicines have worn off.  Your cut will be watched for bleeding. You will need to lie still for a few hours.  Do not drive for 24 hours or as long as your doctor tells you. Summary  Cardiac ablation is a procedure to stop some heart tissue from causing problems.  Electrical energy will be used to scar (ablate) some heart tissue. This information is  not intended to replace advice given to you by your health care provider. Make sure you discuss any questions you have with your health care provider. Document Released: 04/10/2013 Document Revised: 07/21/2017 Document Reviewed: 06/27/2016 Elsevier Patient Education  [place].   Dofetilide capsules What is this medicine? DOFETILIDE (doe FET il ide) is an antiarrhythmic drug. It helps make your heart beat regularly. This medicine also helps to slow rapid heartbeats. This medicine may be used for other purposes; ask your health care provider or pharmacist if you have questions. COMMON BRAND NAME(S):  Tikosyn What should I tell my health care provider before I take this medicine? They need to know if you have any of these conditions:  heart disease  history of irregular heartbeat  history of low levels of potassium or magnesium in the blood  kidney disease  liver disease  an unusual or allergic reaction to dofetilide, other medicines, foods, dyes, or preservatives  pregnant or trying to get pregnant  breast-feeding How should I use this medicine? Take this medicine by mouth with a glass of water. Follow the directions on the prescription label. Do not take with grapefruit juice. You can take it with or without food. If it upsets your stomach, take it with food. Take your medicine at regular intervals. Do not take it more often than directed. Do not stop taking except on your doctor's advice. A special MedGuide will be given to you by the pharmacist with each prescription and refill. Be sure to read this information carefully each time. Talk to your pediatrician regarding the use of this medicine in children. Special care may be needed. Overdosage: If you think you have taken too much of this medicine contact a poison control center or emergency room at once. NOTE: This medicine is only for you. Do not share this medicine with others. What if I miss a dose? If you miss a dose, skip it. Take your next dose at the normal time. Do not take extra or 2 doses at the same time to make up for the missed dose. What may interact with this medicine? Do not take this medicine with any of the following medications:  cimetidine  cisapride  dolutegravir  dronedarone  hydrochlorothiazide  ketoconazole  megestrol  pimozide  prochlorperazine  thioridazine  trimethoprim  verapamil This medicine may also interact with the following medications:  amiloride  cannabinoids  certain antibiotics like erythromycin or clarithromycin  certain antiviral medicines for HIV or  hepatitis  certain medicines for depression, anxiety, or psychotic disorders  digoxin  diltiazem  grapefruit juice  metformin  nefazodone  other medicines that prolong the QT interval (an abnormal heart rhythm)  quinine  triamterene  zafirlukast  ziprasidone This list may not describe all possible interactions. Give your health care provider a list of all the medicines, herbs, non-prescription drugs, or dietary supplements you use. Also tell them if you smoke, drink alcohol, or use illegal drugs. Some items may interact with your medicine. What should I watch for while using this medicine? Your condition will be monitored carefully while you are receiving this medicine. What side effects may I notice from receiving this medicine? Side effects that you should report to your doctor or health care professional as soon as possible:  allergic reactions like skin rash, itching or hives, swelling of the face, lips, or tongue  breathing problems  chest pain or chest tightness  dizziness  signs and symptoms of a dangerous change in heartbeat or heart  rhythm like chest pain; dizziness; fast or irregular heartbeat; palpitations; feeling faint or lightheaded, falls; breathing problems  signs and symptoms of electrolyte imbalance like severe diarrhea, unusual sweating, vomiting, loss of appetite, increased thirst  swelling of the ankles, legs, or feet  tingling, numbness in the hands or feet Side effects that usually do not require medical attention (report to your doctor or health care professional if they continue or are bothersome):  diarrhea  general ill feeling or flu-like symptoms  headache  nausea  trouble sleeping  stomach pain This list may not describe all possible side effects. Call your doctor for medical advice about side effects. You may report side effects to FDA at 1-800-FDA-1088. Where should I keep my medicine? Keep out of the reach of children. Store  at room temperature between 15 and 30 degrees C (59 and 86 degrees F). Throw away any unused medicine after the expiration date. NOTE: This sheet is a summary. It may not cover all possible information. If you have questions about this medicine, talk to your doctor, pharmacist, or health care provider.  2020 Elsevier/Gold Standard (2018-07-30 10:18:48)

## 2019-04-09 NOTE — Telephone Encounter (Signed)
Pt okay for appt with Dr. Caryl Comes as a virtual in [place] 8/20.

## 2019-04-11 ENCOUNTER — Other Ambulatory Visit: Payer: Self-pay

## 2019-04-11 ENCOUNTER — Telehealth (INDEPENDENT_AMBULATORY_CARE_PROVIDER_SITE_OTHER): Payer: Self-pay | Admitting: Internal Medicine

## 2019-04-11 ENCOUNTER — Encounter: Payer: Self-pay | Admitting: Internal Medicine

## 2019-04-11 VITALS — BP 112/78 | HR 81 | Ht 68.5 in | Wt 210.0 lb

## 2019-04-11 DIAGNOSIS — I5042 Chronic combined systolic (congestive) and diastolic (congestive) heart failure: Secondary | ICD-10-CM

## 2019-04-11 DIAGNOSIS — I4891 Unspecified atrial fibrillation: Secondary | ICD-10-CM

## 2019-04-11 DIAGNOSIS — I6330 Cerebral infarction due to thrombosis of unspecified cerebral artery: Secondary | ICD-10-CM

## 2019-04-11 NOTE — Progress Notes (Signed)
Electrophysiology TeleHealth Note   Due to national recommendations of social distancing due to COVID 19, an audio/video telehealth visit is felt to be most appropriate for this patient at this time.  See MyChart message from today for the patient's consent to telehealth for [facility].   Date:  04/11/2019   ID:  [place], DOB 12-19-1965, MRN [id number]  Location: patient's home  Provider location: [address] [name]  Evaluation Performed: Initial Evaluation  PCP:  Patient, No Pcp Per  Cardiologist:  Dorris Carnes, MD   Electrophysiologist:  None   Chief Complaint:   Afib    History of Present Illness:    Ashley [name] is a 53 y.o. female who presents via Engineer, civil (consulting) for a telehealth visit today for discussion regarding atrial fibrillation  She was noted 3/20 to be in atrial flutter.  Cardioversion was delayed because of COVID.  Cardioversion 6/20 was followed by sinus rhythm.  Unfortunately, she reverted soon to atrial fibrillation however her heart rate was quite slow.  Digoxin was reduced.  Heart rates remained low and diltiazem was discontinued.  More recently, she has had problems with recurrent tachypalpitations.       She has a history of mitral valve for severe mid valve prolapse and concomitant bypass surgery 2013.  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  History of a [facility] 2017 treated with TPA.  Event recorder that time showed no atrial fibrillation   DATE TEST EF   3/13 Echo   35-40 %   4/13 Echo  55-65%   3/20  Echo  30% LA 51//51  7/20 Echo  60-65% No LA measurement   Complaint of dyspnea on exertion;  she has had some peripheral edema treated with furosemide.  She saw Richardson Dopp and will Camnitz in the office 8/20.  They recommended either dofetilide or catheter ablation.  She was reluctant to consider either; as I have known her husband for  20 years, they wanted another opinion.  Thromboembolic risk factors (, HTN-1, TIA/CVA-2, Vasc disease -1, CHF-1, Gender-1) for a CHADSVASc Score of >=6    The patient denies symptoms of fevers, chills, cough, or new SOB worrisome for COVID 19.    Past Medical History:  Diagnosis Date  . Abdominal pain    ? chronic cholecystitis; admxn to [facility] 9/12 (CT, USN, HIDA done)  . Cardiomyopathy    echo 9/12: mild LVH, EF 35-40%, mod MR (difficult to judge /post leaflet restricted), mod BAE, mod RVE, mod TR   . Cardiomyopathy ([place])    a. echo 9/12: mild LVH, EF 35-40%, mod MR (difficult to judge /post leaflet restricted), mod BAE, mod RVE, mod TR. b. Echo 11/2011: mild LVH, EF 55-60%, s/p MV repair w/o sig MR/MS, mildly dilated RV/RA, PASP 30mmHg. // AFL dx 10/2018 - Echo 10/2018: EF 30, diff HK, s/p MV repair with trivial MR, mean MV 7 but not c/w significant mitral stenosis, mild RAE, severe LAE, mod reduced RVSF   . Carotid artery disease ([place])    a. Duplex 06/2015: 50% RECA, 1-39% BICA.  [name] Coronary artery disease    a. s/p CABG 10/2011 - LIMA-LAD, SVG-PDA, SVG-OM, SVG-diagonal.  . Dyslipidemia   . Essential hypertension   . Hemifacial spasm   . History of Doppler ultrasound    a. Carotid US [facility] in [place], [name]) 7/17: no hemodynamically significant ICA stenosis  . Mitral  regurgitation    a. s/p MV repair 10/2011 - on Coumadin for 3 months afterwards then d/c'd by surgery.  . Recurrent upper respiratory infection (URI)   . 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 Stroke [facility])    a. age 27 - in setting of cigs and OCPs. //  b. s/p acute L parietal CVA >> tPA - [facility] in [place], [name]  . Tobacco abuse     Past Surgical History:  Procedure Laterality Date  . CARDIAC CATHETERIZATION    . CARDIOVERSION N/A 01/25/2019   Procedure: CARDIOVERSION;  Surgeon: [name], MD;  Location: [facility] ENDOSCOPY;  Service: Cardiovascular;  Laterality: N/A;  .  CESAREAN SECTION    . cesarean section    . CORONARY ARTERY BYPASS GRAFT  10/21/2011   Procedure: CORONARY ARTERY BYPASS GRAFTING (CABG);  Surgeon: [name], MD;  [facility];  Service: Open Heart Surgery;  Laterality: N/A;  cabg x four, using right leg greater saphenous vein harvested endoscopically  . DENTAL SURGERY    . MITRAL VALVE REPAIR  10/21/2011   Procedure: MITRAL VALVE REPAIR (MVR);  Surgeon: [name], MD;  Location: [place];  Service: Open Heart Surgery;  Laterality: N/A;  . TEE WITHOUT CARDIOVERSION  06/30/2011   Procedure: TRANSESOPHAGEAL ECHOCARDIOGRAM (TEE);  Surgeon: [name], MD;  Location: [facility] ENDOSCOPY;  Service: Cardiovascular;  Laterality: N/A;  . TONSILLECTOMY    . TUBAL LIGATION      Current Outpatient Medications  Medication Sig Dispense Refill  . atorvastatin (LIPITOR) 80 MG tablet Take 1 tablet (80 mg total) by mouth daily at 6 PM. 90 tablet 3  . botulinum toxin Type A (BOTOX) 100 units SOLR injection Inject 100 Units into the muscle every 3 (three) months.    . carvedilol (COREG) 25 MG tablet Take 1.5 tablets (37.5 mg total) by mouth 2 (two) times daily. 270 tablet 3  . diltiazem (CARDIZEM) 120 MG tablet Take 120 mg by mouth daily.    . diphenhydrAMINE (BENADRYL) 25 MG tablet Take 25 mg by mouth daily as needed for allergies.    . famotidine (PEPCID) 20 MG tablet Take 20 mg by mouth daily as needed. For indigestion    . furosemide (LASIX) 20 MG tablet TAKE 1 TABLET BY MOUTH AS NEEDED FOR  SWELLING 90 tablet 2  . lisinopril (ZESTRIL) 20 MG tablet Take 0.5 tablets (10 mg total) by mouth daily. 45 tablet 3  . vitamin B-12 (CYANOCOBALAMIN) 1000 MCG tablet Take 1 tablet (1,000 mcg total) by mouth daily.    . vitamin C (ASCORBIC ACID) 500 MG tablet Take 500 mg by mouth 2 (two) times daily.    [name] warfarin (COUMADIN) 5 MG tablet Take 1 tablet (5 mg total) by mouth daily. 30 tablet 1   Current Facility-Administered Medications  Medication Dose Route  Frequency Provider Last Rate Last Dose  . incobotulinumtoxinA (XEOMIN) 50 units injection 50 Units  50 Units Intramuscular Q90 days Marcial Pacas, MD   50 Units at 11/30/16 1546  . incobotulinumtoxinA (XEOMIN) 50 units injection 50 Units  50 Units Intramuscular Q90 days Marcial Pacas, MD   50 Units at 07/25/17 [id number]  . incobotulinumtoxinA (XEOMIN) 50 units injection 50 Units  50 Units Intramuscular Q90 days Marcial Pacas, MD   50 Units at 07/26/17 1217    Allergies:   Azithromycin and Latex   Social History:  The patient  reports that she quit  smoking about 7 years ago. She has a 30.00 pack-year smoking history. She has never used smokeless tobacco. She reports current alcohol use of about 1.0 standard drinks of alcohol per week. She reports that she does not use drugs.   Family History:  The patient's   family history includes COPD in her father; Heart disease in her father; Hypertension in her mother; Stroke in her mother.   ROS:  Please see the history of present illness.   All other systems are personally reviewed and negative.    Exam:    Vital Signs:  BP 112/78   Pulse 81   Ht 5' 8.5" (1.74 m)   Wt 210 lb (95.3 kg)   BMI 31.47 kg/m     Well appearing, alert and conversant, regular work of breathing,  good skin color Eyes- anicteric, neuro- grossly intact, skin- no apparent rash or lesions or cyanosis, mouth- oral mucosa is pink   Labs/Other Tests and Data Reviewed:    Recent Labs: 10/22/2018: TSH 1.600 01/21/2019: BUN 9; Creatinine, Ser 0.92; Hemoglobin 11.7; Platelets 187; Potassium 4.0; Sodium 143   Wt Readings from Last 3 Encounters:  04/11/19 210 lb (95.3 kg)  04/09/19 210 lb 12.8 oz (95.6 kg)  02/26/19 204 lb (92.5 kg)     Other studies personally reviewed: Additional studies/ records that were reviewed today include: As above       ASSESSMENT & PLAN:    Atrial fibrillation/flutter-atypical  Mitral Valve Repair/CABG  CHF acute/chronic diastolic +/- systolic   LAtriopathy  The patient has recurrent atrial arrhythmias in the context of significant left atrial enlargement prior mitral valve repair.  She has had an arrhythmia/rate related cardiomyopathy.   We discussed the physiology of atrial fibrillation and the relationship of both rate and the loss of atrial contractility to impaired cardiac performance. We discussed the strategies of rate control and rhythm control with the potential of pro arrhythmia. We reviewed side effect  profiles of rate controlling drugs, including their idiosyncratic nature as well as the potential for them to reduce blood pressure.  Her atriopathy may preclude effective catheter ablation.  It will also make less effective any antiarrhythmic drug.  With her cardiomyopathy options for the latter include amiodarone and dofetilide.  She has been averse to it, but we discussed side effects as well as complications of procedures and the importance of maintaining control of her rhythm and rate based on her interval cardiomyopathy which thankfully has recovered.  Hence she and her husband will discuss possible admission in the next week or so for dofetilide initiation.  Would anticipate cardioversion on hospital day 2 if she has not converted on her own.  I will asked Dr. Greggory Brandy to think with me about her in the potential benefits of catheter ablation either endovascularly or convergent leak given her atriopathy.      COVID 19 screen The patient denies symptoms of COVID 19 at this time.  The importance of social distancing was discussed today.  Follow-up:  Next week by phone Next remote: na  Current medicines are reviewed at length with the patient today.   The patient does not have concerns regarding her medicines.  The following changes were made today:  none  Labs/ tests ordered today include:   No orders of the defined types were placed in this encounter.   Future tests ( post COVID )     Patient Risk:  after full review  of this patients clinical status, I feel that they  are at moderate risk at this time.  Today, I have spent 24 minutes with the patient with telehealth technology discussing As above  .    Signed, Virl Axe, MD  04/11/2019 5:36 PM     [address] [place] [phone] (office) [phone] (fax)

## 2019-04-12 ENCOUNTER — Telehealth: Payer: Self-pay | Admitting: Pharmacist

## 2019-04-12 ENCOUNTER — Telehealth: Payer: Self-pay | Admitting: Internal Medicine

## 2019-04-12 NOTE — Telephone Encounter (Signed)
Medication list reviewed in anticipation of upcoming Tikosyn initiation. Patient is not taking any contraindicated or QTc prolonging medications.   Patient is anticoagulated on warfarin. Will need to be sure patient has 4 therapeutic INR prior to Tikosyn initiation.   Patient does use Benadryl and will need to be counseled to avoid use of Benadryl while on Tikosyn and in the 2-3 days prior to Tikosyn initiation.  Please ensure K is greater than or equal to 4 and Mg is greater than or equal to 2 prior to initiation, especially since patient is on furosemide.

## 2019-04-12 NOTE — Telephone Encounter (Signed)
Patient is on coumadin - had subtherapeutic INR on 7/31 of 1.9. Her last INR was 4.3 on 8/18 - please advise if you would prefer 4 weekly INRs prior to admit or TEE or ok to admit without either?

## 2019-04-12 NOTE — Patient Instructions (Signed)
Medication Instructions:  - Your physician recommends that you continue on your current medications as directed. Please refer to the Current Medication list given to you today.  If you need a refill on your cardiac medications before your next appointment, please call your pharmacy.   Lab work: - none ordered  If you have labs (blood work) drawn today and your tests are completely normal, you will receive your results only by: [name] MyChart Message (if you have MyChart) OR . A paper copy in the mail If you have any lab test that is abnormal or we need to change your treatment, we will call you to review the results.  Testing/Procedures: - none ordered  Follow-Up: At [facility], you and your health needs are our priority.  As part of our continuing mission to provide you with exceptional heart care, we have created designated Provider Care Teams.  These Care Teams include your primary Cardiologist (physician) and Advanced Practice Providers (APPs -  Physician Assistants and Nurse Practitioners) who all work together to provide you with the care you need, when you need it. . pending  Any Other Special Instructions Will Be Listed Below (If Applicable). - the nurse will call you next week to see what you have decided about Tikosyn

## 2019-04-12 NOTE — Telephone Encounter (Signed)
Pt calling today bc she would like to be set up with a Tikosyn admission next week. Currently, she does not have insurance but states her out of pocket cost for the medication is about $120/mo, which is affordable for her.   I advised her I would forward to the AF clinic to call her and arrange her visit with them and possible admission.   She has verbalized understanding and had no additional questions.

## 2019-04-12 NOTE — Telephone Encounter (Signed)
  Patient is calling in regards to what was discussed in her appt on 04/11/19 with Dr Caryl Comes. She states that he wanted to put her in the hospital and she is calling to see when that will happen.

## 2019-04-13 NOTE — Telephone Encounter (Signed)
Ashley [name]  Your a genius We should do TEE on day of admission, and DCCV and then load her on dofetilide for her 3 days

## 2019-04-15 ENCOUNTER — Telehealth (HOSPITAL_COMMUNITY): Payer: Self-pay | Admitting: Licensed Clinical Social Worker

## 2019-04-15 ENCOUNTER — Encounter (HOSPITAL_COMMUNITY): Payer: Self-pay

## 2019-04-15 NOTE — Telephone Encounter (Signed)
Patient would prefer for weekly INRs to avoid TEE. Will have social worker reach out to patient as she has no health insurance- see if theres any financial assistance programs the patient should qualify for. Pt plans to use goodrx card for cost of dofetilide. Will also give information for RX outreach program regarding dofetilide. Pt in agreement will call me if further questions.

## 2019-04-15 NOTE — Telephone Encounter (Signed)
CSW consulted to assist pt with current insurance concerns.    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  Last time she had insurance it was Medicaid but it was only temporary and appeared to be retro medicaid which only covered her hospital stay from a heart surgery admission.  Pt has not applied for Medicaid since.  Per financial counseling notes patient was provided [facility] application back in June but pt denies receiving it.  CSW discussed [employer] and will send application to pt home and to coumadin clinic for pt to pick up on Wednesday when she goes for her appt.  CSW will continue to follow and assist as needed  Jorge Ny, [facility] Desk#: [phone] Cell#: [phone]

## 2019-04-16 ENCOUNTER — Telehealth (HOSPITAL_COMMUNITY): Payer: Self-pay | Admitting: Licensed Clinical Social Worker

## 2019-04-16 NOTE — Telephone Encounter (Signed)
Opened in error

## 2019-04-16 NOTE — Telephone Encounter (Signed)
[place] application sent to [facility] for pt to pick up during her appt tomorrow- fax confirmation received.  Another copy of application mailed to patient address  CSW will continue to follow and assist as needed  Jorge Ny, [name] [facility] Desk#: [phone] Cell#: [phone]

## 2019-04-17 ENCOUNTER — Ambulatory Visit (INDEPENDENT_AMBULATORY_CARE_PROVIDER_SITE_OTHER): Payer: Self-pay | Admitting: *Deleted

## 2019-04-17 ENCOUNTER — Other Ambulatory Visit: Payer: Self-pay

## 2019-04-17 DIAGNOSIS — I4891 Unspecified atrial fibrillation: Secondary | ICD-10-CM

## 2019-04-17 DIAGNOSIS — Z8673 Personal history of transient ischemic attack (TIA), and cerebral infarction without residual deficits: Secondary | ICD-10-CM

## 2019-04-17 DIAGNOSIS — Z5181 Encounter for therapeutic drug level monitoring: Secondary | ICD-10-CM

## 2019-04-17 LAB — POCT INR: INR: 4.1 — AB (ref 2.0–3.0)

## 2019-04-17 NOTE — Patient Instructions (Signed)
Description   Hold coumadin tomorrow, then start taking 1 tablet daily except for 1/2 a tablet on Sunday, Wednesday and Friday. Eat an extra serving of greens. Recheck INR in 1 week. Call coumadin Clinic [phone] if you have any changes in your medication or upcoming procedures.

## 2019-04-18 ENCOUNTER — Telehealth: Payer: Self-pay | Admitting: Internal Medicine

## 2019-04-18 NOTE — Progress Notes (Signed)
Cardiology Office Note:    Date:  04/19/2019   ID:  [place], DOB [birth year], MRN [id number]  PCP:  Patient, No Pcp Per  Cardiologist:  Dorris Carnes, MD  Electrophysiologist:  None   Referring MD: No ref. provider found   Chief Complaint  Patient presents with  . Follow-up    Atrial fibrillation    History of Present Illness:    Ashley [name] is a 53 y.o. female with:  Coronary artery disease  Mitral valve prolapse with severe mitral regurgitation  Status post CABG plus mitral valve repair in March 0000000  Systolic CHF 0000000 cardiomyopathyw/ EF 35-40 >> improved to normal ? EF down to 30% in setting of AFib/Flutter w/ RVR 10/2018  History of CVA ? Admitted [facility] in [place], [name] in 2017 w/ acute L parietal CVA tx with tPA ? Event monitor in 2017: no AFib  Atrial Flutter ? CHADS2-VASc=6 (CHF, HTN, CVA, CAD, female) >> Apixaban  Hypertension  Hyperlipidemia  Tobacco use   [name] developed AFlutter w/ RVR in 10/2018 with associate LV dysfunction.  She ultimately underwent DCCV in 01/2019 but had ERAF.  She developed profound bradycardia and junctional rhythm and her Digoxin and Diltiazem was DC'd.  She was last seen on 04/09/2019.  Her rate was uncontrolled again and she was symptomatic.  I had her meet with Dr. Curt Bears who offered her Dofetilide vs PVI ablation.  She planned to consider her options and let us know her decision.  I started her back on Diltiazem for rate control.  She then saw Dr. Caryl Comes who recommended Dofetilide.  She had a recent sub-therapeutic INR and she will be admitted for Dofetilide load after 4 weeks of adequate anticoagulation.    She returns for follow-up.  She is here alone.  She does feel her heart racing at times.  She feels bad when this occurs.  She has gained about 5 pounds in the last week to week and a half.  She has not had significant dyspnea with exertion or orthopnea.  She has not had leg swelling.   She has not had melena, hematochezia, hematuria.  She has not had syncope or near syncope.  She has not had chest pain.  Prior CV studies:   The following studies were reviewed today:  Echocardiogram 03/05/2019 EF 60-65, normal RVSF  Long Term Monitor (3-14 day) 12/2018 Atrail fibrillation 71 to 145 bpm Average HR 107 bpm  Occassional PVC, longest burst NSVT 5 beats  24 Hr Holter 11/07/2018 Atypical atrial flutter 44 to 126 bpm Average HR 101 bpm Longest pause 1.8 seconds  Echo 11/01/2018 EF 30, diffuse HK, status post mitral valve repair with trivial MR, mean gradient 7, mild RAE, severe LAE, PASP 35  Event monitor 07/21/2016 SR with occasional PVCs These PVCs were not sensed.  Carotid 11/16 1-39% bilateral ICA stenosis.- FU prn  Echo 4/13 Mild LVH, EF 55-60%, no RWMA, MV repair ok with no MS/MR, mod LAE, mild RVE, mild RAE, PASP 34 mmHg  Past Medical History:  Diagnosis Date  . Abdominal pain    ? chronic cholecystitis; admxn to [facility] 9/12 (CT, USN, HIDA done)  . Cardiomyopathy    echo 9/12: mild LVH, EF 35-40%, mod MR (difficult to judge /post leaflet restricted), mod BAE, mod RVE, mod TR   . Cardiomyopathy ([place])    a. echo 9/12: mild LVH, EF 35-40%, mod MR (difficult to judge /post leaflet restricted), mod BAE, mod RVE, mod TR.  b. Echo 11/2011: mild LVH, EF 55-60%, s/p MV repair w/o sig MR/MS, mildly dilated RV/RA, PASP 58mmHg. // AFL dx 10/2018 - Echo 10/2018: EF 30, diff HK, s/p MV repair with trivial MR, mean MV 7 but not c/w significant mitral stenosis, mild RAE, severe LAE, mod reduced RVSF   . Carotid artery disease ([place])    a. Duplex 06/2015: 50% RECA, 1-39% BICA.  [name] Coronary artery disease    a. s/p CABG 10/2011 - LIMA-LAD, SVG-PDA, SVG-OM, SVG-diagonal.  . Dyslipidemia   . Essential hypertension   . Hemifacial spasm   . History of Doppler ultrasound    a. Carotid [facility] in [place], [name]) 7/17: no hemodynamically significant ICA  stenosis  . Mitral regurgitation    a. s/p MV repair 10/2011 - on Coumadin for 3 months afterwards then d/c'd by surgery.  . Recurrent upper respiratory infection (URI)   . 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 Stroke [facility])    a. age 38 - in setting of cigs and OCPs. //  b. s/p acute L parietal CVA >> tPA - [facility] in [place], [name]  . Tobacco abuse    Surgical Hx: The patient  has a past surgical history that includes Tonsillectomy; Tubal ligation; Cesarean section; cesarean section; TEE without cardioversion (06/30/2011); Mitral valve repair (10/21/2011); Coronary artery bypass graft (10/21/2011); Cardiac catheterization; Dental surgery; and Cardioversion (N/A, 01/25/2019).   Current Medications: Current Meds  Medication Sig  . atorvastatin (LIPITOR) 80 MG tablet Take 1 tablet (80 mg total) by mouth daily at 6 PM.  . botulinum toxin Type A (BOTOX) 100 units SOLR injection Inject 100 Units into the muscle every 3 (three) months.  . carvedilol (COREG) 25 MG tablet Take 1.5 tablets (37.5 mg total) by mouth 2 (two) times daily.  . diphenhydrAMINE (BENADRYL) 25 MG tablet Take 25 mg by mouth daily as needed for allergies.  . famotidine (PEPCID) 20 MG tablet Take 20 mg by mouth daily as needed. For indigestion  . furosemide (LASIX) 20 MG tablet TAKE 1 TABLET BY MOUTH AS NEEDED FOR  SWELLING  . vitamin B-12 (CYANOCOBALAMIN) 1000 MCG tablet Take 1 tablet (1,000 mcg total) by mouth daily.  . vitamin C (ASCORBIC ACID) 500 MG tablet Take 500 mg by mouth 2 (two) times daily.  [name] warfarin (COUMADIN) 5 MG tablet Take 1 tablet (5 mg total) by mouth daily.  . [DISCONTINUED] diltiazem (CARDIZEM) 120 MG tablet Take 120 mg by mouth daily.  . [DISCONTINUED] lisinopril (ZESTRIL) 20 MG tablet Take 0.5 tablets (10 mg total) by mouth daily.   Current Facility-Administered Medications for the 04/19/19 encounter (Office Visit) with Richardson Dopp T, PA-C  Medication  . incobotulinumtoxinA  (XEOMIN) 50 units injection 50 Units  . incobotulinumtoxinA (XEOMIN) 50 units injection 50 Units  . incobotulinumtoxinA (XEOMIN) 50 units injection 50 Units     Allergies:   Azithromycin and Latex   Social History   Tobacco Use  . Smoking status: Former Smoker    Packs/day: 1.00    Years: 30.00    Pack years: 30.00    Quit date: 05/07/2011    Years since quitting: 7.9  . Smokeless tobacco: Never Used  Substance Use Topics  . Alcohol use: Yes    Alcohol/week: 1.0 standard drinks    Types: 1 Glasses of wine per week    Comment: 1 glass of red wine nightly  . Drug use: No  Family Hx: The patient's family history includes COPD in her father; Heart disease in her father; Hypertension in her mother; Stroke in her mother. There is no history of Anesthesia problems, Hypotension, Malignant hyperthermia, or Pseudochol deficiency.  ROS:   Please see the history of present illness.    ROS All other systems reviewed and are negative.   EKGs/Labs/Other Test Reviewed:    EKG:  EKG is  ordered today.  The ekg ordered today demonstrates atrial fibrillation/flutter with variable AV block, heart rate 103, rightward axis, nonspecific ST-T wave changes, similar to prior tracing  Recent Labs: 10/22/2018: TSH 1.600 01/21/2019: BUN 9; Creatinine, Ser 0.92; Hemoglobin 11.7; Platelets 187; Potassium 4.0; Sodium 143   Recent Lipid Panel Lab Results  Component Value Date/Time   CHOL 114 11/16/2017 08:38 AM   TRIG 68 11/16/2017 08:38 AM   HDL 43 11/16/2017 08:38 AM   CHOLHDL 2.7 11/16/2017 08:38 AM   CHOLHDL 3.8 07/01/2016 09:39 AM   LDLCALC 57 11/16/2017 08:38 AM    Physical Exam:    VS:  BP 106/70   Pulse 78   Ht 5\' 8"  (1.727 m)   Wt 212 lb (96.2 kg)   SpO2 99%   BMI 32.23 kg/m     Wt Readings from Last 3 Encounters:  04/19/19 212 lb (96.2 kg)  04/11/19 210 lb (95.3 kg)  04/09/19 210 lb 12.8 oz (95.6 kg)     Physical Exam  Constitutional: She is oriented to person, place, and  time. She appears well-developed and well-nourished. No distress.  HENT:  Head: Normocephalic and atraumatic.  Eyes: No scleral icterus.  Neck: No JVD (prominent A wave) present. No thyromegaly present.  Cardiovascular: Normal heart sounds. An irregularly irregular rhythm present. Tachycardia present.  No murmur heard. Pulmonary/Chest: Effort normal and breath sounds normal. She has no rales.  Abdominal: Soft. There is no hepatomegaly.  Musculoskeletal:        General: No edema.  Lymphadenopathy:    She has no cervical adenopathy.  Neurological: She is alert and oriented to person, place, and time.  Skin: Skin is warm and dry.  Psychiatric: She has a normal mood and affect.    ASSESSMENT & PLAN:    1. Atrial fibrillation with RVR (HCC) Heart rate is somewhat improved.  She does have episodes where her heart is racing and she feels poorly.  I have recommended increasing diltiazem to 120 mg twice daily.  For now, she can hold lisinopril to avoid hypotension.  She has an appointment with Roderic Palau, NP in the atrial fibrillation clinic September 8.  She will keep this appointment.  She will likely be set up for dofetilide load at that point.  -Increase diltiazem to 120 mg twice daily  -Hold lisinopril  -Follow-up in A. fib clinic 9/8  2. 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  Her ejection fraction improved to normal with reduced heart rate.  I suspect her ejection fraction is lower again.  She has gained several pounds at home.  I have asked her to take Lasix for 3 days in a row then resume as needed dosing.  For now, we will hold her lisinopril to avoid hypotension in the setting of increased rate controlling drugs.  3. Coronary artery disease involving native coronary artery of native heart without angina pectoris History of CABG.  She is not having angina.  She is not on aspirin  as she is  on Warfarin.  Continue statin therapy.  4. S/P mitral valve repair Continue SBE prophylaxis.   Dispo:  Return in 11 days (on 04/30/2019) for Scheduled Follow Up w/ Roderic Palau, NP in the AFib Clinic.   Medication Adjustments/Labs and Tests Ordered: Current medicines are reviewed at length with the patient today.  Concerns regarding medicines are outlined above.  Tests Ordered: Orders Placed This Encounter  Procedures  . EKG 12-Lead   Medication Changes: Meds ordered this encounter  Medications  . diltiazem (CARDIZEM SR) 120 MG 12 hr capsule    Sig: Take 1 capsule (120 mg total) by mouth 2 (two) times daily.    Dispense:  180 capsule    Refill:  3    Signed, Richardson Dopp, PA-C  04/19/2019 9:01 AM    [address], [place], [place]  [address] Phone: [phone]; Fax: [phone]

## 2019-04-19 ENCOUNTER — Other Ambulatory Visit: Payer: Self-pay

## 2019-04-19 ENCOUNTER — Encounter: Payer: Self-pay | Admitting: Physician Assistant

## 2019-04-19 ENCOUNTER — Ambulatory Visit (INDEPENDENT_AMBULATORY_CARE_PROVIDER_SITE_OTHER): Payer: Self-pay | Admitting: Physician Assistant

## 2019-04-19 VITALS — BP 106/70 | HR 78 | Ht 68.0 in | Wt 212.0 lb

## 2019-04-19 DIAGNOSIS — I4891 Unspecified atrial fibrillation: Secondary | ICD-10-CM

## 2019-04-19 DIAGNOSIS — I2510 Atherosclerotic heart disease of native coronary artery without angina pectoris: Secondary | ICD-10-CM

## 2019-04-19 DIAGNOSIS — I5042 Chronic combined systolic (congestive) and diastolic (congestive) heart failure: Secondary | ICD-10-CM

## 2019-04-19 DIAGNOSIS — Z9889 Other specified postprocedural states: Secondary | ICD-10-CM

## 2019-04-19 MED ORDER — DILTIAZEM HCL ER 120 MG PO CP12: 120.0000 mg | ORAL_CAPSULE | Freq: Two times a day (BID) | ORAL | 3 refills | Status: DC

## 2019-04-19 NOTE — Patient Instructions (Signed)
Medication Instructions:  Your physician has recommended you make the following change in your medication:  1. STOP LISINOPRIL. 2. INCREASE DILTIAZEM TO 120 MG TWICE DAILY. 3. TAKE LASIX 20 MG DAILY FOR 3 DAYS, THEN GO BACK TO AS NEEDED.  If you need a refill on your cardiac medications before your next appointment, please call your pharmacy.   Lab work: NONE If you have labs (blood work) drawn today and your tests are completely normal, you will receive your results only by: [name] MyChart Message (if you have MyChart) OR . A paper copy in the mail If you have any lab test that is abnormal or we need to change your treatment, we will call you to review the results.  Testing/Procedures: NONE  Follow-Up: At [facility], you and your health needs are our priority.  As part of our continuing mission to provide you with exceptional heart care, we have created designated Provider Care Teams.  These Care Teams include your primary Cardiologist (physician) and Advanced Practice Providers (APPs -  Physician Assistants and Nurse Practitioners) who all work together to provide you with the care you need, when you need it. [name] KEEP APPOINTMENT WITH DONNA CARROL ON 9/8 AT A-FIB CLINIC  Any Other Special Instructions Will Be Listed Below (If Applicable).

## 2019-04-24 ENCOUNTER — Other Ambulatory Visit: Payer: Self-pay

## 2019-04-24 ENCOUNTER — Other Ambulatory Visit (HOSPITAL_COMMUNITY): Payer: Self-pay | Admitting: *Deleted

## 2019-04-24 ENCOUNTER — Ambulatory Visit (INDEPENDENT_AMBULATORY_CARE_PROVIDER_SITE_OTHER): Payer: Self-pay | Admitting: Pharmacist

## 2019-04-24 DIAGNOSIS — Z8673 Personal history of transient ischemic attack (TIA), and cerebral infarction without residual deficits: Secondary | ICD-10-CM

## 2019-04-24 DIAGNOSIS — I4891 Unspecified atrial fibrillation: Secondary | ICD-10-CM

## 2019-04-24 DIAGNOSIS — Z5181 Encounter for therapeutic drug level monitoring: Secondary | ICD-10-CM

## 2019-04-24 LAB — POCT INR: INR: 2.9 (ref 2.0–3.0)

## 2019-04-24 NOTE — Patient Instructions (Signed)
Description   Continue taking 1 tablet daily except for 1/2 a tablet on Sunday, Wednesday and Friday. Eat an extra serving of greens. Recheck INR in 3 weeks. Call coumadin Clinic [phone] if you have any changes in your medication or upcoming procedures.

## 2019-04-26 ENCOUNTER — Other Ambulatory Visit: Payer: Self-pay

## 2019-04-26 ENCOUNTER — Other Ambulatory Visit (HOSPITAL_COMMUNITY)
Admission: RE | Admit: 2019-04-26 | Discharge: 2019-04-26 | Disposition: A | Discharge status: Home / Self Care | Payer: HRSA Program | Source: Ambulatory Visit | Attending: Internal Medicine | Admitting: Internal Medicine

## 2019-04-26 DIAGNOSIS — Z01812 Encounter for preprocedural laboratory examination: Secondary | ICD-10-CM | POA: Insufficient documentation

## 2019-04-26 DIAGNOSIS — Z20828 Contact with and (suspected) exposure to other viral communicable diseases: Secondary | ICD-10-CM | POA: Diagnosis not present

## 2019-04-27 LAB — NOVEL CORONAVIRUS, NAA (HOSP ORDER, SEND-OUT TO REF LAB; TAT 18-24 HRS): SARS-CoV-2, NAA: NOT DETECTED

## 2019-04-30 ENCOUNTER — Encounter (HOSPITAL_COMMUNITY): Payer: Self-pay | Admitting: General Practice

## 2019-04-30 ENCOUNTER — Ambulatory Visit (INDEPENDENT_AMBULATORY_CARE_PROVIDER_SITE_OTHER): Payer: Self-pay | Admitting: *Deleted

## 2019-04-30 ENCOUNTER — Encounter (HOSPITAL_COMMUNITY): Payer: Self-pay | Admitting: Nurse Practitioner

## 2019-04-30 ENCOUNTER — Ambulatory Visit (HOSPITAL_COMMUNITY)
Admission: RE | Admit: 2019-04-30 | Discharge: 2019-04-30 | Disposition: A | Discharge status: Home / Self Care | Payer: Self-pay | Source: Ambulatory Visit | Attending: Nurse Practitioner | Admitting: Nurse Practitioner

## 2019-04-30 ENCOUNTER — Other Ambulatory Visit: Payer: Self-pay

## 2019-04-30 ENCOUNTER — Inpatient Hospital Stay (HOSPITAL_COMMUNITY)
Admission: RE | Admit: 2019-04-30 | Discharge: 2019-05-06 | DRG: 309 | Disposition: A | Discharge status: Home / Self Care | Payer: Self-pay | Source: Ambulatory Visit | Attending: Internal Medicine | Admitting: Internal Medicine

## 2019-04-30 VITALS — BP 142/80 | HR 72 | Ht 68.0 in | Wt 208.0 lb

## 2019-04-30 DIAGNOSIS — Z823 Family history of stroke: Secondary | ICD-10-CM

## 2019-04-30 DIAGNOSIS — I5042 Chronic combined systolic (congestive) and diastolic (congestive) heart failure: Secondary | ICD-10-CM | POA: Diagnosis present

## 2019-04-30 DIAGNOSIS — I4891 Unspecified atrial fibrillation: Secondary | ICD-10-CM

## 2019-04-30 DIAGNOSIS — I429 Cardiomyopathy, unspecified: Secondary | ICD-10-CM | POA: Diagnosis present

## 2019-04-30 DIAGNOSIS — I4819 Other persistent atrial fibrillation: Secondary | ICD-10-CM

## 2019-04-30 DIAGNOSIS — Z91018 Allergy to other foods: Secondary | ICD-10-CM

## 2019-04-30 DIAGNOSIS — Z87891 Personal history of nicotine dependence: Secondary | ICD-10-CM

## 2019-04-30 DIAGNOSIS — Z5181 Encounter for therapeutic drug level monitoring: Secondary | ICD-10-CM

## 2019-04-30 DIAGNOSIS — Z951 Presence of aortocoronary bypass graft: Secondary | ICD-10-CM

## 2019-04-30 DIAGNOSIS — E785 Hyperlipidemia, unspecified: Secondary | ICD-10-CM | POA: Diagnosis present

## 2019-04-30 DIAGNOSIS — Z6831 Body mass index (BMI) 31.0-31.9, adult: Secondary | ICD-10-CM

## 2019-04-30 DIAGNOSIS — Z881 Allergy status to other antibiotic agents status: Secondary | ICD-10-CM

## 2019-04-30 DIAGNOSIS — Z8249 Family history of ischemic heart disease and other diseases of the circulatory system: Secondary | ICD-10-CM

## 2019-04-30 DIAGNOSIS — I484 Atypical atrial flutter: Secondary | ICD-10-CM | POA: Diagnosis present

## 2019-04-30 DIAGNOSIS — Z8673 Personal history of transient ischemic attack (TIA), and cerebral infarction without residual deficits: Secondary | ICD-10-CM

## 2019-04-30 DIAGNOSIS — I110 Hypertensive heart disease with heart failure: Secondary | ICD-10-CM | POA: Diagnosis present

## 2019-04-30 DIAGNOSIS — E669 Obesity, unspecified: Secondary | ICD-10-CM | POA: Diagnosis present

## 2019-04-30 DIAGNOSIS — Z9851 Tubal ligation status: Secondary | ICD-10-CM

## 2019-04-30 DIAGNOSIS — E876 Hypokalemia: Secondary | ICD-10-CM | POA: Diagnosis present

## 2019-04-30 DIAGNOSIS — Z7901 Long term (current) use of anticoagulants: Secondary | ICD-10-CM

## 2019-04-30 DIAGNOSIS — I2510 Atherosclerotic heart disease of native coronary artery without angina pectoris: Secondary | ICD-10-CM | POA: Diagnosis present

## 2019-04-30 DIAGNOSIS — Z91040 Latex allergy status: Secondary | ICD-10-CM

## 2019-04-30 HISTORY — DX: Unspecified atrial fibrillation: I48.91

## 2019-04-30 LAB — BASIC METABOLIC PANEL
Anion gap: 10 (ref 5–15)
Anion gap: 9 (ref 5–15)
BUN: 8 mg/dL (ref 6–20)
BUN: 10 mg/dL (ref 6–20)
CO2: 26 mmol/L (ref 22–32)
CO2: 25 mmol/L (ref 22–32)
Calcium: 8.6 mg/dL — ABNORMAL LOW (ref 8.9–10.3)
Calcium: 8.6 mg/dL — ABNORMAL LOW (ref 8.9–10.3)
Chloride: 101 mmol/L (ref 98–111)
Chloride: 103 mmol/L (ref 98–111)
Creatinine, Ser: 0.9 mg/dL (ref 0.44–1.00)
Creatinine, Ser: 0.8 mg/dL (ref 0.44–1.00)
GFR calc Af Amer: >60 mL/min (ref >60)
GFR calc Af Amer: >60 mL/min (ref >60)
GFR calc non Af Amer: >60 mL/min (ref >60)
GFR calc non Af Amer: >60 mL/min (ref >60)
Glucose, Bld: 116 mg/dL — ABNORMAL HIGH (ref 70–99)
Glucose, Bld: 122 mg/dL — ABNORMAL HIGH (ref 70–99)
Potassium: 3.4 mmol/L — ABNORMAL LOW (ref 3.5–5.1)
Potassium: 4.4 mmol/L (ref 3.5–5.1)
Sodium: 137 mmol/L (ref 135–145)
Sodium: 137 mmol/L (ref 135–145)

## 2019-04-30 LAB — POCT INR: INR: 3.9 — AB (ref 2.0–3.0)

## 2019-04-30 LAB — MAGNESIUM: Magnesium: 2 mg/dL (ref 1.7–2.4)

## 2019-04-30 MED ORDER — VITAMIN C 500 MG PO TABS
500.0000 mg | ORAL_TABLET | Freq: Two times a day (BID) | ORAL | Status: DC
  Administered 2019-04-30 – 2019-05-06 (×12): 500 mg via ORAL
  Filled 2019-04-30 (×12): qty 1

## 2019-04-30 MED ORDER — POTASSIUM CHLORIDE 10 MEQ/100ML IV SOLN
10.0000 meq | INTRAVENOUS | Status: DC
  Administered 2019-04-30: 10 meq via INTRAVENOUS

## 2019-04-30 MED ORDER — WARFARIN SODIUM 5 MG PO TABS: 5.0000 mg | ORAL_TABLET | Freq: Every day | ORAL | Status: DC

## 2019-04-30 MED ORDER — DILTIAZEM HCL ER 60 MG PO CP12
120.0000 mg | ORAL_CAPSULE | Freq: Two times a day (BID) | ORAL | Status: DC
  Administered 2019-04-30 – 2019-05-01 (×2): 120 mg via ORAL
  Filled 2019-04-30 (×3): qty 2

## 2019-04-30 MED ORDER — SODIUM CHLORIDE 0.9% FLUSH
3.0000 mL | Freq: Two times a day (BID) | INTRAVENOUS | Status: DC
  Administered 2019-04-30 – 2019-05-06 (×12): 3 mL via INTRAVENOUS

## 2019-04-30 MED ORDER — FAMOTIDINE 20 MG PO TABS
20.0000 mg | ORAL_TABLET | Freq: Every day | ORAL | Status: DC | PRN
  Administered 2019-05-02: 23:00:00 20 mg via ORAL
  Filled 2019-04-30: qty 1

## 2019-04-30 MED ORDER — ATORVASTATIN CALCIUM 80 MG PO TABS
80.0000 mg | ORAL_TABLET | Freq: Every day | ORAL | Status: DC
  Administered 2019-04-30 – 2019-05-05 (×6): 80 mg via ORAL
  Filled 2019-04-30 (×6): qty 1

## 2019-04-30 MED ORDER — SODIUM CHLORIDE 0.9 % IV SOLN: 250.0000 mL | INTRAVENOUS | Status: DC | PRN

## 2019-04-30 MED ORDER — DOFETILIDE 500 MCG PO CAPS
500.0000 ug | ORAL_CAPSULE | Freq: Two times a day (BID) | ORAL | Status: DC
  Administered 2019-04-30 – 2019-05-01 (×3): 500 ug via ORAL
  Filled 2019-04-30 (×3): qty 1

## 2019-04-30 MED ORDER — POTASSIUM CHLORIDE CRYS ER 20 MEQ PO TBCR: 40.0000 meq | EXTENDED_RELEASE_TABLET | Freq: Once | ORAL | Status: DC

## 2019-04-30 MED ORDER — POTASSIUM CHLORIDE CRYS ER 20 MEQ PO TBCR
40.0000 meq | EXTENDED_RELEASE_TABLET | ORAL | Status: AC
  Administered 2019-04-30: 40 meq via ORAL
  Filled 2019-04-30: qty 2

## 2019-04-30 MED ORDER — SODIUM CHLORIDE 0.9% FLUSH: 3.0000 mL | INTRAVENOUS | Status: DC | PRN

## 2019-04-30 MED ORDER — CARVEDILOL 25 MG PO TABS
37.5000 mg | ORAL_TABLET | Freq: Two times a day (BID) | ORAL | Status: DC
  Administered 2019-04-30 – 2019-05-01 (×2): 37.5 mg via ORAL
  Filled 2019-04-30 (×2): qty 1

## 2019-04-30 MED ORDER — VITAMIN B-12 1000 MCG PO TABS
1000.0000 ug | ORAL_TABLET | Freq: Every day | ORAL | Status: DC
  Administered 2019-05-01 – 2019-05-06 (×6): 1000 ug via ORAL
  Filled 2019-04-30 (×6): qty 1

## 2019-04-30 MED ORDER — FUROSEMIDE 20 MG PO TABS: 20.0000 mg | ORAL_TABLET | ORAL | Status: DC | PRN

## 2019-04-30 MED ORDER — POTASSIUM CHLORIDE 10 MEQ/100ML IV SOLN
10.0000 meq | INTRAVENOUS | Status: AC
  Administered 2019-04-30: 17:00:00 10 meq via INTRAVENOUS
  Filled 2019-04-30 (×3): qty 100

## 2019-04-30 NOTE — Progress Notes (Signed)
Pharmacy: Dofetilide (Tikosyn) - Initial Consult Assessment and Electrolyte Replacement  Pharmacy consulted to assist in monitoring and replacing electrolytes in this 53 y.o. female admitted on 04/30/2019 undergoing dofetilide initiation. First dofetilide dose: 04/30/19  Assessment:  Patient Exclusion Criteria: If any screening criteria checked as "Yes", then  patient  should NOT receive dofetilide until criteria item is corrected.  If "Yes" please indicate correction plan.  YES  NO Patient  Exclusion Criteria Correction Plan   []   [x]   Baseline QTc interval is greater than or equal to 440 msec. IF above YES box checked dofetilide contraindicated unless patient has ICD; then may proceed if QTc 500-550 msec or with known ventricular conduction abnormalities may proceed with QTc 550-600 msec. QTc =  420 per MD note    []   [x]   Patient is known or suspected to have a digoxin level greater than 2 ng/ml: No results found for: DIGOXIN     []   [x]   Creatinine clearance less than 20 ml/min (calculated using Cockcroft-Gault, actual body weight and serum creatinine): Estimated Creatinine Clearance: 85.8 mL/min (by C-G formula based on SCr of 0.9 mg/dL).     []   [x]  Patient has received drugs known to prolong the QT intervals within the last 48 hours (phenothiazines, tricyclics or tetracyclic antidepressants, erythromycin, H-1 antihistamines, cisapride, fluoroquinolones, azithromycin). Updated information on QT prolonging agents is available to be searched on the following database:QT prolonging agents     []   [x]   Patient received a dose of hydrochlorothiazide (Oretic) alone or in any combination including triamterene (Dyazide, Maxzide) in the last 48 hours.    []   [x]  Patient received a medication known to increase dofetilide plasma concentrations prior to initial dofetilide dose:  . Trimethoprim (Primsol, Proloprim) in the last 36 hours . Verapamil (Calan, Verelan) in the last 36 hours or  a sustained release dose in the last 72 hours . Megestrol (Megace) in the last 5 days  . Cimetidine (Tagamet) in the last 6 hours . Ketoconazole (Nizoral) in the last 24 hours . Itraconazole (Sporanox) in the last 48 hours  . Prochlorperazine (Compazine) in the last 36 hours     []   [x]   Patient is known to have a history of torsades de pointes; congenital or acquired long QT syndromes.    []   [x]   Patient has received a Class 1 antiarrhythmic with less than 2 half-lives since last dose. (Disopyramide, Quinidine, Procainamide, Lidocaine, Mexiletine, Flecainide, Propafenone)    []   [x]   Patient has received amiodarone therapy in the past 3 months or amiodarone level is greater than 0.3 ng/ml.    Patient has been appropriately anticoagulated with warfarin (INR= 3.9 on 9/8).Kdur 70meq po has been given and 85meq IV ordered per MD   Plan:  Potassium: -ordered per MD -recheck potassium at 9pm  Magnesium: Mg >2: Appropriate to initiate Tikosyn, no replacement needed     Thank you for allowing pharmacy to participate in this patient's care   Hildred Laser, PharmD Clinical Pharmacist **Pharmacist phone directory can now be found on [email or web address] ([email or web address]).  Listed under [place].

## 2019-04-30 NOTE — Progress Notes (Signed)
Primary Care Physician: Patient, No Pcp Per Primary Cardiologist: Dr Harrington Challenger Primary Electrophysiologist: Dr Caryl Comes Referring Physician: Dr Charma Igo Ashley [name] is a 53 y.o. female with a history of atrial fibrillation, atrial flutter w/ RVR in 10/2018 with associate LV dysfunction, CAD s/p CABG 2013, MVP with severe MR s/p repair, CVA, HTN, and tobacco abuse who presents for follow up in the AF Clinic. Patient underwent DCCV in 01/2019 but had ERAF.  She developed profound bradycardia and junctional rhythm and her Digoxin and Diltiazem was DC'd. Her rate was uncontrolled again and she was symptomatic. She then saw Dr. Caryl Comes who recommended Dofetilide.   Today patient reports that she does have heart racing at times and fatigue. She denies SOB. She presents today for dofetilide admission.  Today, she denies symptoms of palpitations, chest pain, shortness of breath, orthopnea, PND, lower extremity edema, dizziness, presyncope, syncope, snoring, daytime somnolence, bleeding, or neurologic sequela. The patient is tolerating medications without difficulties and is otherwise without complaint today.    she has a BMI of Body mass index is 31.63 kg/m.[name] Filed Weights   04/30/19 1158  Weight: 94.3 kg    Family History  Problem Relation Age of Onset   Hypertension Mother    Stroke Mother    Heart disease Father    COPD Father    Anesthesia problems Neg Hx    Hypotension Neg Hx    Malignant hyperthermia Neg Hx    Pseudochol deficiency Neg Hx      Atrial Fibrillation Management history:  Previous antiarrhythmic drugs: none Previous cardioversions: 01/25/19 Previous ablations: none CHADS2VASC score: 6 Anticoagulation history: warfarin   Past Medical History:  Diagnosis Date   Abdominal pain    ? chronic cholecystitis; admxn to [facility] 9/12 (CT, USN, HIDA done)   Cardiomyopathy    echo 9/12: mild LVH, EF 35-40%, mod MR (difficult to judge /post leaflet restricted), mod  BAE, mod RVE, mod TR    Cardiomyopathy ([place])    a. echo 9/12: mild LVH, EF 35-40%, mod MR (difficult to judge /post leaflet restricted), mod BAE, mod RVE, mod TR. b. Echo 11/2011: mild LVH, EF 55-60%, s/p MV repair w/o sig MR/MS, mildly dilated RV/RA, PASP 24mmHg. // AFL dx 10/2018 - Echo 10/2018: EF 30, diff HK, s/p MV repair with trivial MR, mean MV 7 but not c/w significant mitral stenosis, mild RAE, severe LAE, mod reduced RVSF    Carotid artery disease ([place])    a. Duplex 06/2015: 50% RECA, 1-39% BICA.   Coronary artery disease    a. s/p CABG 10/2011 - LIMA-LAD, SVG-PDA, SVG-OM, SVG-diagonal.   Dyslipidemia    Essential hypertension    Hemifacial spasm    History of Doppler ultrasound    a. Carotid US [facility] in [place], [name]) 7/17: no hemodynamically significant ICA stenosis   Mitral regurgitation    a. s/p MV repair 10/2011 - on Coumadin for 3 months afterwards then d/c'd by surgery.   Recurrent upper respiratory infection (URI)    S/P CABG x 4 10/21/2011   S/P mitral valve repair 10/21/2011   Stroke [facility])    a. age 37 - in setting of cigs and OCPs. //  b. s/p acute L parietal CVA >> tPA - [facility] in [place], [name]   Tobacco abuse    Past Surgical History:  Procedure Laterality Date   CARDIAC CATHETERIZATION     CARDIOVERSION N/A 01/25/2019   Procedure: CARDIOVERSION;  Surgeon:  [name], MD;  Location: [facility] ENDOSCOPY;  Service: Cardiovascular;  Laterality: N/A;   CESAREAN SECTION     cesarean section     CORONARY ARTERY BYPASS GRAFT  10/21/2011   Procedure: CORONARY ARTERY BYPASS GRAFTING (CABG);  Surgeon: [name], MD;  Location: [place];  Service: Open Heart Surgery;  Laterality: N/A;  cabg x four, using right leg greater saphenous vein harvested endoscopically   DENTAL SURGERY     MITRAL VALVE REPAIR  10/21/2011   Procedure: MITRAL VALVE REPAIR (MVR);  Surgeon: [name], MD;  Location: [place];  Service: Open Heart Surgery;   Laterality: N/A;   TEE WITHOUT CARDIOVERSION  06/30/2011   Procedure: TRANSESOPHAGEAL ECHOCARDIOGRAM (TEE);  Surgeon: [name], MD;  Location[facility] ENDOSCOPY;  Service: Cardiovascular;  Laterality: N/A;   TONSILLECTOMY     TUBAL LIGATION      No current facility-administered medications for this encounter.    No current outpatient medications on file.   Facility-Administered Medications Ordered in Other Encounters  Medication Dose Route Frequency Provider Last Rate Last Dose   atorvastatin (LIPITOR) tablet 80 mg  80 mg Oral q1800 Elie Gragert R, PA       carvedilol (COREG) tablet 37.5 mg  37.5 mg Oral BID Bronsyn Shappell R, PA       diltiazem (CARDIZEM SR) 12 hr capsule 120 mg  120 mg Oral BID Lysha Schrade R, PA       famotidine (PEPCID) tablet 20 mg  20 mg Oral Daily PRN Viktorya Arguijo R, PA       furosemide (LASIX) tablet 20 mg  20 mg Oral PRN Jaren Kearn R, PA       potassium chloride 10 mEq in 100 mL IVPB  10 mEq Intravenous Q1 Hr x 3 Tillery, Satira Mccallum, PA-C       vitamin B-12 (CYANOCOBALAMIN) tablet 1,000 mcg  1,000 mcg Oral Daily Geral Coker R, PA       vitamin C (ASCORBIC ACID) tablet 500 mg  500 mg Oral BID Jett Fukuda R, PA       warfarin (COUMADIN) tablet 5 mg  5 mg Oral Daily Brittni Hult R, PA        Allergies  Allergen Reactions   Azithromycin Nausea Only   Latex Rash    rash    Social History   Socioeconomic History   Marital status: Married    Spouse name: Not on file   Number of children: 16   Years of education: 2   Highest education level: Not on file  Occupational History   Occupation: Nature conservation officer    Comment: 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  Social Needs   Emergency planning/management officer strain: Not on file   Food insecurity    Worry: Not on file    Inability: Not on file   Transportation needs    Medical: Not on file    Non-medical: Not on file  Tobacco Use   Smoking status: Former Smoker    Packs/day: 1.00     Years: 30.00    Pack years: 30.00    Quit date: 05/07/2011    Years since quitting: 7.9   Smokeless tobacco: Never Used  Substance and Sexual Activity   Alcohol use: Yes    Alcohol/week: 1.0 standard drinks    Types: 1 Glasses of wine per week    Comment: 1 glass of red wine nightly   Drug use: No   Sexual activity: Yes  Lifestyle  Physical activity    Days per week: Not on file    Minutes per session: Not on file   Stress: Not on file  Relationships   Social connections    Talks on phone: Not on file    Gets together: Not on file    Attends religious service: Not on file    Active member of club or organization: Not on file    Attends meetings of clubs or organizations: Not on file    Relationship status: Not on file   Intimate partner violence    Fear of current or ex partner: Not on file    Emotionally abused: Not on file    Physically abused: Not on file    Forced sexual activity: Not on file  Other Topics Concern   Not on file  Social History Narrative   Lives at home with husband and son.   Right-handed.   2 cups caffeine daily.     ROS- All systems are reviewed and negative except as per the HPI above.  Physical Exam: Vitals:   04/30/19 1158  BP: (!) 142/80  Pulse: 72  Weight: 94.3 kg  Height: 5\' 8"  (1.727 m)    GEN- The patient is well appearing obese female, alert and oriented x 3 today.   Head- normocephalic, atraumatic Eyes-  Sclera clear, conjunctiva pink Ears- hearing intact Oropharynx- clear Neck- supple  Lungs- Clear to ausculation bilaterally, normal work of breathing Heart- irregular rate and rhythm, no murmurs, rubs or gallops  GI- soft, NT, ND, + BS Extremities- no clubbing, cyanosis, or edema MS- no significant deformity or atrophy Skin- no rash or lesion Psych- euthymic mood, full affect Neuro- strength and sensation are intact  Wt Readings from Last 3 Encounters:  04/30/19 94.3 kg  04/19/19 96.2 kg  04/11/19 95.3 kg     EKG today demonstrates afib HR 72, inc RBBB, QRS 116, QTc 486  Echo 03/05/19 demonstrated  1. The left ventricle has normal systolic function, with an ejection fraction of 60-65%. The cavity size was normal.  2. The right ventricle has normal systolc function. The cavity was normal. There is no increase in right ventricular wall thickness.  3. Aortic valve regurgitation was not assessed by color flow Doppler.  4. Pulmonic valve regurgitation was not assessed by color flow Doppler.  Epic records are reviewed at length today  Assessment and Plan:  1. Persistent atrial fibrillation/atrial flutter Patient wants to pursue dofetilide, aware of risk vrs benefit Aware of price of dofetilide and was also given GOOD RX card Patient will continue on anticoagulation, states no missed doses No benadryl use PharmD has screened drugs and no QT prolonging drugs on board QTc in SR 420 ms, Labs today show creatinine at 0.9, K+ 3.4 and mag 2.0, CrCl calculated at 106 mL/min Patient to get K+ supplementation prior to first dose of dofetilide.    This patients CHA2DS2-VASc Score and unadjusted Ischemic Stroke Rate (% per year) is equal to 9.7 % stroke rate/year from a score of 6  Above score calculated as 1 point each if present [CHF, HTN, DM, Vascular=MI/PAD/Aortic Plaque, Age if 65-74, or Female] Above score calculated as 2 points each if present [Age > 75, or Stroke/TIA/TE]   2. Obesity Body mass index is 31.63 kg/m. Lifestyle modification was discussed at length including regular exercise and weight reduction.  3. CAD S/p CABG. No anginal symptoms today.  4. Combined chronic systolic/diastolic CHF Hopefully will improved with restoration of SR.  Continue Coreg.  5. MVP with MR S/p repair 2013.   To be admitted today once bed becomes available.   [facility] [address] [place], [place] [address] [phone] 04/30/2019 2:24 PM

## 2019-04-30 NOTE — Plan of Care (Signed)
  Problem: Education: Goal: Knowledge of disease or condition will improve Outcome: Progressing Goal: Understanding of medication regimen will improve Outcome: Progressing Goal: Individualized Educational Video(s) Outcome: Progressing   

## 2019-04-30 NOTE — Progress Notes (Signed)
ANTICOAGULATION CONSULT NOTE - Initial Consult  Pharmacy Consult for coumadin Indication: atrial fibrillation  Allergies  Allergen Reactions  . Azithromycin Nausea Only  . Latex Rash    rash    Patient Measurements:   Vital Signs: Temp: 98.5 F (36.9 C) (09/08 1400) Temp Source: Oral (09/08 1400) BP: 155/85 (09/08 1400) Pulse Rate: 72 (09/08 1158)  Labs: Recent Labs    04/30/19 1103 04/30/19 1137  INR 3.9*  --   CREATININE  --  0.90    Estimated Creatinine Clearance: 85.8 mL/min (by C-G formula based on SCr of 0.9 mg/dL).   Medical History: Past Medical History:  Diagnosis Date  . Abdominal pain    ? chronic cholecystitis; admxn to [facility] 9/12 (CT, USN, HIDA done)  . Cardiomyopathy    echo 9/12: mild LVH, EF 35-40%, mod MR (difficult to judge /post leaflet restricted), mod BAE, mod RVE, mod TR   . Cardiomyopathy ([place])    a. echo 9/12: mild LVH, EF 35-40%, mod MR (difficult to judge /post leaflet restricted), mod BAE, mod RVE, mod TR. b. Echo 11/2011: mild LVH, EF 55-60%, s/p MV repair w/o sig MR/MS, mildly dilated RV/RA, PASP 75mmHg. // AFL dx 10/2018 - Echo 10/2018: EF 30, diff HK, s/p MV repair with trivial MR, mean MV 7 but not c/w significant mitral stenosis, mild RAE, severe LAE, mod reduced RVSF   . Carotid artery disease ([place])    a. Duplex 06/2015: 50% RECA, 1-39% BICA.  [name] Coronary artery disease    a. s/p CABG 10/2011 - LIMA-LAD, SVG-PDA, SVG-OM, SVG-diagonal.  . Dyslipidemia   . Essential hypertension   . Hemifacial spasm   . History of Doppler ultrasound    a. Carotid US [facility] in [place], [name]) 7/17: no hemodynamically significant ICA stenosis  . Mitral regurgitation    a. s/p MV repair 10/2011 - on Coumadin for 3 months afterwards then d/c'd by surgery.  . Recurrent upper respiratory infection (URI)   . 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 Stroke [facility])    a. age 49 - in setting of cigs and OCPs. //  b. s/p acute L  parietal CVA >> tPA - [facility] in [place], [name]  . Tobacco abuse     Medications:  Facility-Administered Medications Prior to Admission  Medication Dose Route Frequency Provider Last Rate Last Dose  . incobotulinumtoxinA (XEOMIN) 50 units injection 50 Units  50 Units Intramuscular Q90 days Marcial Pacas, MD   50 Units at 11/30/16 1546  . incobotulinumtoxinA (XEOMIN) 50 units injection 50 Units  50 Units Intramuscular Q90 days Marcial Pacas, MD   50 Units at 07/25/17 [phone]  . incobotulinumtoxinA (XEOMIN) 50 units injection 50 Units  50 Units Intramuscular Q90 days Marcial Pacas, MD   50 Units at 07/26/17 1217   Medications Prior to Admission  Medication Sig Dispense Refill Last Dose  . atorvastatin (LIPITOR) 80 MG tablet Take 1 tablet (80 mg total) by mouth daily at 6 PM. 90 tablet 3   . botulinum toxin Type A (BOTOX) 100 units SOLR injection Inject 100 Units into the muscle every 3 (three) months.     . carvedilol (COREG) 25 MG tablet Take 1.5 tablets (37.5 mg total) by mouth 2 (two) times daily. 270 tablet 3   . diltiazem (CARDIZEM SR) 120 MG 12 hr capsule Take 1 capsule (120 mg total) by mouth 2 (two) times daily. 180 capsule 3   .  famotidine (PEPCID) 20 MG tablet Take 20 mg by mouth daily as needed. For indigestion     . furosemide (LASIX) 20 MG tablet TAKE 1 TABLET BY MOUTH AS NEEDED FOR  SWELLING 90 tablet 2   . vitamin B-12 (CYANOCOBALAMIN) 1000 MCG tablet Take 1 tablet (1,000 mcg total) by mouth daily.     . vitamin C (ASCORBIC ACID) 500 MG tablet Take 500 mg by mouth 2 (two) times daily.     [name] warfarin (COUMADIN) 5 MG tablet Take 1 tablet (5 mg total) by mouth daily. 30 tablet 1     Assessment: 53 yo female here for Tikosyn initiation. Pharmacy consulted to dose warfarin -INR= 3.9  Home dose: 5mg /day except 2.5mg  on SuWeFr (last visit 9/8)  Goal of Therapy:  INR 2-3 Monitor platelets by anticoagulation protocol: Yes   Plan:  -hold warfarin -Daily PT/INR  Hildred Laser,  PharmD Clinical Pharmacist **Pharmacist phone directory can now be found on [email or web address] ([email or web address]).  Listed under [place].

## 2019-04-30 NOTE — Progress Notes (Signed)
Pharmacy: Dofetilide (Tikosyn) - Follow Up Assessment and Electrolyte Replacement  Pharmacy consulted to assist in monitoring and replacing electrolytes in this 53 y.o. female admitted on 04/30/2019 undergoing dofetilide initiation. First dofetilide dose: 500 mcg @2030   Labs:    Component Value Date/Time   K 4.4 04/30/2019 1939   MG 2.0 04/30/2019 1137     Plan: Potassium: K >/= 4: No additional supplementation needed  Magnesium: Mg > 2: No additional supplementation needed  Thank you for allowing pharmacy to participate in this patient's care   Berenice Bouton, PharmD PGY1 Pharmacy Resident Office phone: [phone] Phone until 9 pm: [phone] 04/30/2019  8:26 PM

## 2019-04-30 NOTE — Patient Instructions (Addendum)
Description   Since you have already taken today's dose do not take any Coumadin tomorrow then continue taking 1 tablet daily except for 1/2 a tablet on Sunday, Wednesday and Friday. Eat an extra serving of greens. Recheck INR in 1 week post Tikosyn admit. Call Coumadin Clinic [phone] if you have any changes in your medication or upcoming procedures.

## 2019-05-01 LAB — BASIC METABOLIC PANEL
Anion gap: 9 (ref 5–15)
BUN: 8 mg/dL (ref 6–20)
CO2: 23 mmol/L (ref 22–32)
Calcium: 8.8 mg/dL — ABNORMAL LOW (ref 8.9–10.3)
Chloride: 105 mmol/L (ref 98–111)
Creatinine, Ser: 0.74 mg/dL (ref 0.44–1.00)
GFR calc Af Amer: >60 mL/min (ref >60)
GFR calc non Af Amer: >60 mL/min (ref >60)
Glucose, Bld: 111 mg/dL — ABNORMAL HIGH (ref 70–99)
Potassium: 4.6 mmol/L (ref 3.5–5.1)
Sodium: 137 mmol/L (ref 135–145)

## 2019-05-01 LAB — PROTIME-INR
INR: 2.8 — ABNORMAL HIGH (ref 0.8–1.2)
Prothrombin Time: 28.9 seconds — ABNORMAL HIGH (ref 11.4–15.2)

## 2019-05-01 LAB — HIV ANTIBODY (ROUTINE TESTING W REFLEX): HIV Screen 4th Generation wRfx: NONREACTIVE

## 2019-05-01 LAB — MAGNESIUM: Magnesium: 1.9 mg/dL (ref 1.7–2.4)

## 2019-05-01 MED ORDER — MAGNESIUM SULFATE 2 GM/50ML IV SOLN
2.0000 g | Freq: Once | INTRAVENOUS | Status: AC
  Administered 2019-05-01: 2 g via INTRAVENOUS
  Filled 2019-05-01: qty 50

## 2019-05-01 MED ORDER — DILTIAZEM HCL ER COATED BEADS 120 MG PO CP24
120.0000 mg | ORAL_CAPSULE | Freq: Every day | ORAL | Status: DC
  Administered 2019-05-02 – 2019-05-06 (×5): 120 mg via ORAL
  Filled 2019-05-01 (×5): qty 1

## 2019-05-01 MED ORDER — CARVEDILOL 25 MG PO TABS: 25.0000 mg | ORAL_TABLET | Freq: Two times a day (BID) | ORAL | Status: DC

## 2019-05-01 MED ORDER — CARVEDILOL 25 MG PO TABS
37.5000 mg | ORAL_TABLET | Freq: Two times a day (BID) | ORAL | Status: DC
  Administered 2019-05-02 – 2019-05-06 (×8): 37.5 mg via ORAL
  Filled 2019-05-01 (×9): qty 1

## 2019-05-01 MED ORDER — WARFARIN - PHARMACIST DOSING INPATIENT
Freq: Every day | Status: DC
  Administered 2019-05-01 – 2019-05-05 (×2)

## 2019-05-01 MED ORDER — WARFARIN SODIUM 5 MG PO TABS
5.0000 mg | ORAL_TABLET | Freq: Once | ORAL | Status: AC
  Administered 2019-05-01: 18:00:00 5 mg via ORAL
  Filled 2019-05-01: qty 1

## 2019-05-01 MED ORDER — MAGNESIUM OXIDE 400 (241.3 MG) MG PO TABS: 400.0000 mg | ORAL_TABLET | Freq: Every day | ORAL | Status: DC

## 2019-05-01 NOTE — Progress Notes (Addendum)
  Follow up EKG reviewed.  Shows Junctional bradycardia with stable QTc. Will not need DCCV tomorrow.   Will discuss adjustments to diltiazem and coreg with MD.   Shirley Friar, PA-C  Pager: [phone]  05/01/2019 12:05 PM   Has gradually recovered sinus rhythm with HR now in 60's  Will decrease dilt 120 daily

## 2019-05-01 NOTE — Progress Notes (Signed)
Pharmacy: Dofetilide (Tikosyn) - Follow Up Assessment and Electrolyte Replacement  Pharmacy consulted to assist in monitoring and replacing electrolytes in this 53 y.o. female admitted on 04/30/2019 undergoing dofetilide initiation.  Labs:    Component Value Date/Time   K 4.6 05/01/2019 0735   MG 1.9 05/01/2019 0338     Plan: Potassium: K >/= 4: No additional supplementation needed  Magnesium: Mg 1.8-2: Give Mg 2 gm IV x1     Thank you for allowing pharmacy to participate in this patient's care   Hildred Laser, PharmD Clinical Pharmacist **Pharmacist phone directory can now be found on [email or web address] ([email or web address]).  Listed under [place].

## 2019-05-01 NOTE — Progress Notes (Addendum)
Electrophysiology Rounding Note  Patient Name: [place] Date of Encounter: 05/01/2019  Electrophysiologist: Dr. Caryl Comes   Subjective   EKG shows Afib 117 bpm with QTc ~ 500   The patient is doing well today.  At this time, the patient denies chest pain, shortness of breath, or any new concerns.  Inpatient Medications    Scheduled Meds: . atorvastatin  80 mg Oral q1800  . carvedilol  37.5 mg Oral BID  . diltiazem  120 mg Oral BID  . dofetilide  500 mcg Oral BID  . sodium chloride flush  3 mL Intravenous Q12H  . vitamin B-12  1,000 mcg Oral Daily  . vitamin C  500 mg Oral BID   Continuous Infusions: . sodium chloride    . magnesium sulfate bolus IVPB     PRN Meds: sodium chloride, famotidine, furosemide, sodium chloride flush   Vital Signs    Vitals:   04/30/19 1400 04/30/19 2021 05/01/19 0500  BP: (!) 155/85 (!) 132/97 112/66  Pulse:  (!) 134 100  Resp: 16    Temp: 98.5 F (36.9 C) 98.9 F (37.2 C) 98.2 F (36.8 C)  TempSrc: Oral Oral Oral  SpO2: 100% 98% 100%  Weight:   94.4 kg    Intake/Output Summary (Last 24 hours) at 05/01/2019 0740 Last data filed at 04/30/2019 2025 Gross per 24 hour  Intake 261.91 ml  Output -  Net 261.91 ml   Filed Weights   05/01/19 0500  Weight: 94.4 kg    Physical Exam    GEN- The patient is well appearing, alert and oriented x 3 today.   Head- normocephalic, atraumatic Eyes-  Sclera clear, conjunctiva pink Ears- hearing intact Oropharynx- clear Neck- supple Lungs- Clear to ausculation bilaterally, normal work of breathing Heart- Irregularly irregular rate and rhythm, no murmurs, rubs or gallops GI- soft, NT, ND, + BS Extremities- no clubbing, cyanosis, or edema Skin- no rash or lesion Psych- euthymic mood, full affect Neuro- strength and sensation are intact  Labs    CBC No results for input(s): WBC, NEUTROABS, HGB, HCT, MCV, PLT in the last 72 hours. Basic Metabolic Panel Recent Labs    04/30/19  1137 04/30/19 1939 05/01/19 0338  NA 137 137  --   K 3.4* 4.4  --   CL 101 103  --   CO2 26 25  --   GLUCOSE 116* 122*  --   BUN 8 10  --   CREATININE 0.90 0.80  --   CALCIUM 8.6* 8.6*  --   MG 2.0  --  1.9   Liver Function Tests No results for input(s): AST, ALT, ALKPHOS, BILITOT, PROT, ALBUMIN in the last 72 hours. No results for input(s): LIPASE, AMYLASE in the last 72 hours. Cardiac Enzymes No results for input(s): CKTOTAL, CKMB, CKMBINDEX, TROPONINI in the last 72 hours. BNP Invalid input(s): POCBNP D-Dimer No results for input(s): DDIMER in the last 72 hours. Hemoglobin A1C No results for input(s): HGBA1C in the last 72 hours. Fasting Lipid Panel No results for input(s): CHOL, HDL, LDLCALC, TRIG, CHOLHDL, LDLDIRECT in the last 72 hours. Thyroid Function Tests No results for input(s): TSH, T4TOTAL, T3FREE, THYROIDAB in the last 72 hours.  Invalid input(s): FREET3  Telemetry    Afib 100-110s (personally reviewed)  Radiology    No results found.  Patient Profile     [place] is a 53 y.o. female with a past medical history significant for persistent Afib. She was admitted for  Tikosyn load.   Assessment & Plan    1. Persistent Atrial fibrillation Remains in Afib.   QTc relatively stable for rate/Afib.  Continue Tikosyn 500 mcg BID for now.  Continue coumadin BMET pending Mg 1.9. Supp ordered.  For questions or updates, please contact [place] Please consult [email or web address] for contact info under Cardiology/STEMI.  Signed, Shirley Friar, PA-C  05/01/2019, 7:40 AM     AFib and now in junctional rhythm with symptomatic post termination pause--suspect she will come to pacing.   Will need to reassess LA size >> discordant measurement  7/20 normal  3/20 "severely dilated" ( biplane volume 55.5/ 4C vol 51/m2)

## 2019-05-01 NOTE — H&P (Signed)
Primary Care Physician: Patient, No Pcp Per Primary Cardiologist: Dr Harrington Challenger Primary Electrophysiologist: Dr Caryl Comes Referring Physician: Dr Charma Igo Ashley [name] is a 53 y.o. female with a history of atrial fibrillation, atrial flutter w/ RVR in 10/2018 with associate LV dysfunction, CAD s/p CABG 2013, MVP with severe MR s/p repair, CVA, HTN, and tobacco abuse who presents for follow up in the AF Clinic. Patient underwent DCCV in 01/2019 but had ERAF.  She developed profound bradycardia and junctional rhythm and her Digoxin and Diltiazem was DC'd. Her rate was uncontrolled again and she was symptomatic. She then saw Dr. Caryl Comes who recommended Dofetilide.   Today patient reports that she does have heart racing at times and fatigue. She denies SOB. She presents today for dofetilide admission.  Today, she denies symptoms of palpitations, chest pain, shortness of breath, orthopnea, PND, lower extremity edema, dizziness, presyncope, syncope, snoring, daytime somnolence, bleeding, or neurologic sequela. The patient is tolerating medications without difficulties and is otherwise without complaint today.    she has a BMI of Body mass index is 31.64 kg/m.[name] Filed Weights   05/01/19 0500  Weight: 94.4 kg    Family History  Problem Relation Age of Onset  . Hypertension Mother   . Stroke Mother   . Heart disease Father   . COPD Father   . Anesthesia problems Neg Hx   . Hypotension Neg Hx   . Malignant hyperthermia Neg Hx   . Pseudochol deficiency Neg Hx      Atrial Fibrillation Management history:  Previous antiarrhythmic drugs: none Previous cardioversions: 01/25/19 Previous ablations: none CHADS2VASC score: 6 Anticoagulation history: warfarin   Past Medical History:  Diagnosis Date  . Abdominal pain    ? chronic cholecystitis; admxn to [facility] 9/12 (CT, USN, HIDA done)  . Atrial fibrillation ([place])   . Cardiomyopathy    echo 9/12: mild LVH, EF 35-40%, mod MR (difficult to judge  /post leaflet restricted), mod BAE, mod RVE, mod TR   . Cardiomyopathy ([place])    a. echo 9/12: mild LVH, EF 35-40%, mod MR (difficult to judge /post leaflet restricted), mod BAE, mod RVE, mod TR. b. Echo 11/2011: mild LVH, EF 55-60%, s/p MV repair w/o sig MR/MS, mildly dilated RV/RA, PASP 92mmHg. // AFL dx 10/2018 - Echo 10/2018: EF 30, diff HK, s/p MV repair with trivial MR, mean MV 7 but not c/w significant mitral stenosis, mild RAE, severe LAE, mod reduced RVSF   . Carotid artery disease ([place])    a. Duplex 06/2015: 50% RECA, 1-39% BICA.  [name] Coronary artery disease    a. s/p CABG 10/2011 - LIMA-LAD, SVG-PDA, SVG-OM, SVG-diagonal.  . Dyslipidemia   . Essential hypertension   . Hemifacial spasm   . History of Doppler ultrasound    a. Carotid US [facility] in [place], [name]) 7/17: no hemodynamically significant ICA stenosis  . Mitral regurgitation    a. s/p MV repair 10/2011 - on Coumadin for 3 months afterwards then d/c'd by surgery.  . Recurrent upper respiratory infection (URI)   . 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 Stroke [facility])    a. age 70 - in setting of cigs and OCPs. //  b. s/p acute L parietal CVA >> tPA - [facility] in [place], [name]  . Tobacco abuse    Past Surgical History:  Procedure Laterality Date  . CARDIAC CATHETERIZATION    . CARDIOVERSION N/A 01/25/2019  Procedure: CARDIOVERSION;  Surgeon: [name], MD;  Location[facility];  Service: Cardiovascular;  Laterality: N/A;  . CESAREAN SECTION    . cesarean section    . CORONARY ARTERY BYPASS GRAFT  10/21/2011   Procedure: CORONARY ARTERY BYPASS GRAFTING (CABG);  Surgeon: [name], MD;  Location: [place];  Service: Open Heart Surgery;  Laterality: N/A;  cabg x four, using right leg greater saphenous vein harvested endoscopically  . DENTAL SURGERY    . MITRAL VALVE REPAIR  10/21/2011   Procedure: MITRAL VALVE REPAIR (MVR);  Surgeon: [name], MD;  Location: [place];   Service: Open Heart Surgery;  Laterality: N/A;  . TEE WITHOUT CARDIOVERSION  06/30/2011   Procedure: TRANSESOPHAGEAL ECHOCARDIOGRAM (TEE);  Surgeon: [name], MD;  Location: [facility] ENDOSCOPY;  Service: Cardiovascular;  Laterality: N/A;  . TONSILLECTOMY    . TUBAL LIGATION      Current Facility-Administered Medications  Medication Dose Route Frequency Provider Last Rate Last Dose  . 0.9 %  sodium chloride infusion  250 mL Intravenous PRN Fenton, Clint R, PA      . atorvastatin (LIPITOR) tablet 80 mg  80 mg Oral q1800 Fenton, Clint R, PA   80 mg at 04/30/19 2154  . carvedilol (COREG) tablet 37.5 mg  37.5 mg Oral BID Shirley Friar, PA-C      . Derrill Memo ON 05/02/2019] diltiazem (CARDIZEM CD) 24 hr capsule 120 mg  120 mg Oral Daily Shirley Friar, PA-C      . dofetilide (TIKOSYN) capsule 500 mcg  500 mcg Oral BID Fenton, Clint R, PA   500 mcg at 05/01/19 [id number]  . famotidine (PEPCID) tablet 20 mg  20 mg Oral Daily PRN Fenton, Clint R, PA      . furosemide (LASIX) tablet 20 mg  20 mg Oral PRN Fenton, Clint R, PA      . sodium chloride flush (NS) 0.9 % injection 3 mL  3 mL Intravenous Q12H Fenton, Clint R, PA   3 mL at 05/01/19 0833  . sodium chloride flush (NS) 0.9 % injection 3 mL  3 mL Intravenous PRN Fenton, Clint R, PA      . vitamin B-12 (CYANOCOBALAMIN) tablet 1,000 mcg  1,000 mcg Oral Daily Fenton, Clint R, PA   1,000 mcg at 05/01/19 0833  . vitamin C (ASCORBIC ACID) tablet 500 mg  500 mg Oral BID Fenton, Clint R, PA   500 mg at 05/01/19 0833  . warfarin (COUMADIN) tablet 5 mg  5 mg Oral ONCE-1800 Kris Mouton, [facility]      . Warfarin - Pharmacist Dosing Inpatient   Does not apply q1800 Kris Mouton, [facility]        Allergies  Allergen Reactions  . Azithromycin Nausea Only  . Latex Rash    rash  . Spinach Hives    Social History   Socioeconomic History  . Marital status: Married    Spouse name: Not on file  . Number of children: 14  . Years of education: 2  . Highest  education level: Not on file  Occupational History  . 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  Social Needs  . Financial resource strain: Not on file  . Food insecurity    Worry: Not on file    Inability: Not on file  . Transportation needs    Medical: Not on file    Non-medical: Not on file  Tobacco Use  .  Smoking status: Former Smoker    Packs/day: 1.00    Years: 30.00    Pack years: 30.00    Quit date: 05/07/2011    Years since quitting: 7.9  . Smokeless tobacco: Never Used  Substance and Sexual Activity  . Alcohol use: Yes    Alcohol/week: 1.0 standard drinks    Types: 1 Glasses of wine per week    Comment: 1 glass of red wine nightly  . Drug use: No  . Sexual activity: Yes  Lifestyle  . Physical activity    Days per week: Not on file    Minutes per session: Not on file  . Stress: Not on file  Relationships  . Social Herbalist on phone: Not on file    Gets together: Not on file    Attends religious service: Not on file    Active member of club or organization: Not on file    Attends meetings of clubs or organizations: Not on file    Relationship status: Not on file  . Intimate partner violence    Fear of current or ex partner: Not on file    Emotionally abused: Not on file    Physically abused: Not on file    Forced sexual activity: Not on file  Other Topics Concern  . Not on file  Social History Narrative   Lives at home with husband and son.   Right-handed.   2 cups caffeine daily.    ROS- All systems are reviewed and negative except as per the HPI above.  Physical Exam: Vitals:   04/30/19 1400 04/30/19 2021 05/01/19 0500 05/01/19 1329  BP: (!) 155/85 (!) 132/97 112/66 124/73  Pulse:  (!) 134 100 66  Resp: 16   14  Temp: 98.5 F (36.9 C) 98.9 F (37.2 C) 98.2 F (36.8 C) 98.5 F (36.9 C)  TempSrc: Oral Oral Oral Oral  SpO2: 100% 98% 100% 96%  Weight:   94.4 kg     GEN- The patient is well  appearing obese female, alert and oriented x 3 today.   Head- normocephalic, atraumatic Eyes-  Sclera clear, conjunctiva pink Ears- hearing intact Oropharynx- clear Neck- supple  Lungs- Clear to ausculation bilaterally, normal work of breathing Heart- irregular rate and rhythm, no murmurs, rubs or gallops  GI- soft, NT, ND, + BS Extremities- no clubbing, cyanosis, or edema MS- no significant deformity or atrophy Skin- no rash or lesion Psych- euthymic mood, full affect Neuro- strength and sensation are intact  Wt Readings from Last 3 Encounters:  05/01/19 94.4 kg  04/30/19 94.3 kg  04/19/19 96.2 kg    EKG today demonstrates afib HR 72, inc RBBB, QRS 116, QTc 486  Echo 03/05/19 demonstrated  1. The left ventricle has normal systolic function, with an ejection fraction of 60-65%. The cavity size was normal.  2. The right ventricle has normal systolc function. The cavity was normal. There is no increase in right ventricular wall thickness.  3. Aortic valve regurgitation was not assessed by color flow Doppler.  4. Pulmonic valve regurgitation was not assessed by color flow Doppler.  Epic records are reviewed at length today  Assessment and Plan:  1. Persistent atrial fibrillation/atrial flutter Patient wants to pursue dofetilide, aware of risk vrs benefit Aware of price of dofetilide and was also given GOOD RX card Patient will continue on anticoagulation, states no missed doses No benadryl use PharmD has screened drugs and no QT prolonging drugs on board QTc  in SR 420 ms, Labs today show creatinine at 0.9, K+ 3.4 and mag 2.0, CrCl calculated at 106 mL/min Patient to get K+ supplementation prior to first dose of dofetilide.    This patients CHA2DS2-VASc Score and unadjusted Ischemic Stroke Rate (% per year) is equal to 9.7 % stroke rate/year from a score of 6  Above score calculated as 1 point each if present [CHF, HTN, DM, Vascular=MI/PAD/Aortic Plaque, Age if 65-74, or  Female] Above score calculated as 2 points each if present [Age > 75, or Stroke/TIA/TE]  2. Hypokalemia K 3.4. Discussed with Dr. Rayann Heman. Will admit and supp aggressively in attempt to still start Tikosyn this evening.   3. Obesity Body mass index is 31.64 kg/m. Lifestyle modification was discussed at length including regular exercise and weight reduction.  4. CAD S/p CABG. No anginal symptoms today.  5. Combined chronic systolic/diastolic CHF Hopefully will improved with restoration of SR. Continue Coreg.  6. MVP with MR S/p repair 2013.  To be admitted for tikosyn load today. once bed becomes available.   Ashley [name] [facility] [address] [place], [place] [address] [phone] 04/30/2019   I have seen, examined the patient, and reviewed the above assessment and plan.  Changes to above are made where necessary.  On exam, iRRR.  She reports compliance with anticoagulation without interruption.  We will admit for initiation of tikosyn at this time.  Co Sign: [name], MD

## 2019-05-01 NOTE — Progress Notes (Signed)
[place] for coumadin Indication: atrial fibrillation  Allergies  Allergen Reactions  . Azithromycin Nausea Only  . Latex Rash    rash  . Spinach Hives    Patient Measurements:   Vital Signs: Temp: 98.2 F (36.8 C) (09/09 0500) Temp Source: Oral (09/09 0500) BP: 112/66 (09/09 0500) Pulse Rate: 100 (09/09 0500)  Labs: Recent Labs    04/30/19 1103 04/30/19 1137 04/30/19 1939 05/01/19 0338 05/01/19 0735  LABPROT  --   --   --  28.9*  --   INR 3.9*  --   --  2.8*  --   CREATININE  --  0.90 0.80  --  0.74    Estimated Creatinine Clearance: 96.6 mL/min (by C-G formula based on SCr of 0.74 mg/dL).   Medical History: Past Medical History:  Diagnosis Date  . Abdominal pain    ? chronic cholecystitis; admxn to [facility] 9/12 (CT, USN, HIDA done)  . Atrial fibrillation ([place])   . Cardiomyopathy    echo 9/12: mild LVH, EF 35-40%, mod MR (difficult to judge /post leaflet restricted), mod BAE, mod RVE, mod TR   . Cardiomyopathy ([place])    a. echo 9/12: mild LVH, EF 35-40%, mod MR (difficult to judge /post leaflet restricted), mod BAE, mod RVE, mod TR. b. Echo 11/2011: mild LVH, EF 55-60%, s/p MV repair w/o sig MR/MS, mildly dilated RV/RA, PASP 88mmHg. // AFL dx 10/2018 - Echo 10/2018: EF 30, diff HK, s/p MV repair with trivial MR, mean MV 7 but not c/w significant mitral stenosis, mild RAE, severe LAE, mod reduced RVSF   . Carotid artery disease ([place])    a. Duplex 06/2015: 50% RECA, 1-39% BICA.  [name] Coronary artery disease    a. s/p CABG 10/2011 - LIMA-LAD, SVG-PDA, SVG-OM, SVG-diagonal.  . Dyslipidemia   . Essential hypertension   . Hemifacial spasm   . History of Doppler ultrasound    a. Carotid US [facility] in [place], [name]) 7/17: no hemodynamically significant ICA stenosis  . Mitral regurgitation    a. s/p MV repair 10/2011 - on Coumadin for 3 months afterwards then d/c'd by surgery.  . Recurrent upper respiratory infection (URI)   .  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 Stroke [facility])    a. age 54 - in setting of cigs and OCPs. //  b. s/p acute L parietal CVA >> tPA - [facility] in [place], [name]  . Tobacco abuse     Medications:  Facility-Administered Medications Prior to Admission  Medication Dose Route Frequency Provider Last Rate Last Dose  . incobotulinumtoxinA (XEOMIN) 50 units injection 50 Units  50 Units Intramuscular Q90 days Marcial Pacas, MD   50 Units at 11/30/16 1546  . incobotulinumtoxinA (XEOMIN) 50 units injection 50 Units  50 Units Intramuscular Q90 days Marcial Pacas, MD   50 Units at 07/25/17 [phone]  . incobotulinumtoxinA (XEOMIN) 50 units injection 50 Units  50 Units Intramuscular Q90 days Marcial Pacas, MD   50 Units at 07/26/17 1217   Medications Prior to Admission  Medication Sig Dispense Refill Last Dose  . atorvastatin (LIPITOR) 80 MG tablet Take 1 tablet (80 mg total) by mouth daily at 6 PM. 90 tablet 3 04/29/2019 at Unknown time  . carvedilol (COREG) 25 MG tablet Take 1.5 tablets (37.5 mg total) by mouth 2 (two) times daily. 270 tablet 3 04/30/2019 at 1000  . diltiazem (CARDIZEM SR) 120 MG  12 hr capsule Take 1 capsule (120 mg total) by mouth 2 (two) times daily. 180 capsule 3 04/30/2019 at Unknown time  . famotidine (PEPCID) 20 MG tablet Take 20 mg by mouth daily as needed. For indigestion   Past Week at Unknown time  . furosemide (LASIX) 20 MG tablet TAKE 1 TABLET BY MOUTH AS NEEDED FOR  SWELLING (Patient taking differently: Take 20 mg by mouth daily as needed for fluid or edema. ) 90 tablet 2 Past Week at Unknown time  . vitamin B-12 (CYANOCOBALAMIN) 1000 MCG tablet Take 1 tablet (1,000 mcg total) by mouth daily.   04/30/2019 at Unknown time  . vitamin C (ASCORBIC ACID) 500 MG tablet Take 500 mg by mouth 2 (two) times daily.   Past Week at Unknown time  . warfarin (COUMADIN) 5 MG tablet Take 1 tablet (5 mg total) by mouth daily. 30 tablet 1 04/30/2019 at 1000    Assessment: 53 yo  female here for Tikosyn initiation. Pharmacy consulted to dose warfarin -INR= 2.8  Home dose: 5mg /day except 2.5mg  on SuWeFr (last visit 9/8)  Goal of Therapy:  INR 2-3 Monitor platelets by anticoagulation protocol: Yes   Plan:  -Warfarin 5mg  po today -Daily PT/INR  Hildred Laser, PharmD Clinical Pharmacist **Pharmacist phone directory can now be found on [email or web address] ([email or web address]).  Listed under [place].

## 2019-05-01 NOTE — Addendum Note (Signed)
Encounter addended by: Shirley Friar, PA-C on: 05/01/2019 11:32 AM  Actions taken: Clinical Note Signed

## 2019-05-01 NOTE — Progress Notes (Signed)
Received call from Central Telemetry about patient having a 4 second pause and then becoming bradycardic. Went to check on patient. Patient stated she felt dizzy around the time of the pause. EKG was performed. Pt had converted to junctional bradycardia. Paged [place], [place] with EP. He stated that they would come see the patient and that they would make some medication adjustments. No new orders received. Will continue to monitor.

## 2019-05-01 NOTE — H&P (Deleted)
Primary Care Physician: Patient, No Pcp Per Primary Cardiologist: Dr Harrington Challenger Primary Electrophysiologist: Dr Caryl Comes Referring Physician: Dr Charma Igo Chesler is a 53 y.o. female with a history of atrial fibrillation, atrial flutter w/ RVR in 10/2018 with associate LV dysfunction, CAD s/p CABG 2013, MVP with severe MR s/p repair, CVA, HTN, and tobacco abuse who presents for follow up in the AF Clinic. Patient underwent DCCV in 01/2019 but had ERAF.  She developed profound bradycardia and junctional rhythm and her Digoxin and Diltiazem was DC'd. Her rate was uncontrolled again and she was symptomatic. She then saw Dr. Caryl Comes who recommended Dofetilide.   Today patient reports that she does have heart racing at times and fatigue. She denies SOB. She presents today for dofetilide admission.  Today, she denies symptoms of palpitations, chest pain, shortness of breath, orthopnea, PND, lower extremity edema, dizziness, presyncope, syncope, snoring, daytime somnolence, bleeding, or neurologic sequela. The patient is tolerating medications without difficulties and is otherwise without complaint today.    she has a BMI of Body mass index is 31.63 kg/m.[name] Filed Weights   04/30/19 1158  Weight: 94.3 kg    Family History  Problem Relation Age of Onset  . Hypertension Mother   . Stroke Mother   . Heart disease Father   . COPD Father   . Anesthesia problems Neg Hx   . Hypotension Neg Hx   . Malignant hyperthermia Neg Hx   . Pseudochol deficiency Neg Hx      Atrial Fibrillation Management history:  Previous antiarrhythmic drugs: none Previous cardioversions: 01/25/19 Previous ablations: none CHADS2VASC score: 6 Anticoagulation history: warfarin   Past Medical History:  Diagnosis Date  . Abdominal pain    ? chronic cholecystitis; admxn to [facility] 9/12 (CT, USN, HIDA done)  . Atrial fibrillation ([place])   . Cardiomyopathy    echo 9/12: mild LVH, EF 35-40%, mod MR (difficult to judge  /post leaflet restricted), mod BAE, mod RVE, mod TR   . Cardiomyopathy ([place])    a. echo 9/12: mild LVH, EF 35-40%, mod MR (difficult to judge /post leaflet restricted), mod BAE, mod RVE, mod TR. b. Echo 11/2011: mild LVH, EF 55-60%, s/p MV repair w/o sig MR/MS, mildly dilated RV/RA, PASP 20mmHg. // AFL dx 10/2018 - Echo 10/2018: EF 30, diff HK, s/p MV repair with trivial MR, mean MV 7 but not c/w significant mitral stenosis, mild RAE, severe LAE, mod reduced RVSF   . Carotid artery disease ([place])    a. Duplex 06/2015: 50% RECA, 1-39% BICA.  [name] Coronary artery disease    a. s/p CABG 10/2011 - LIMA-LAD, SVG-PDA, SVG-OM, SVG-diagonal.  . Dyslipidemia   . Essential hypertension   . Hemifacial spasm   . History of Doppler ultrasound    a. Carotid US [facility] in [place], [name]) 7/17: no hemodynamically significant ICA stenosis  . Mitral regurgitation    a. s/p MV repair 10/2011 - on Coumadin for 3 months afterwards then d/c'd by surgery.  . Recurrent upper respiratory infection (URI)   . 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 Stroke [facility])    a. age 87 - in setting of cigs and OCPs. //  b. s/p acute L parietal CVA >> tPA - [facility] in [place], [name]  . Tobacco abuse    Past Surgical History:  Procedure Laterality Date  . CARDIAC CATHETERIZATION    . CARDIOVERSION N/A 01/25/2019  Procedure: CARDIOVERSION;  Surgeon: [name], MD;  Location: [facility] ENDOSCOPY;  Service: Cardiovascular;  Laterality: N/A;  . CESAREAN SECTION    . cesarean section    . CORONARY ARTERY BYPASS GRAFT  10/21/2011   Procedure: CORONARY ARTERY BYPASS GRAFTING (CABG);  Surgeon: [name], MD;  Location: [place];  Service: Open Heart Surgery;  Laterality: N/A;  cabg x four, using right leg greater saphenous vein harvested endoscopically  . DENTAL SURGERY    . MITRAL VALVE REPAIR  10/21/2011   Procedure: MITRAL VALVE REPAIR (MVR);  Surgeon: [name], MD;  Location: [place];   Service: Open Heart Surgery;  Laterality: N/A;  . TEE WITHOUT CARDIOVERSION  06/30/2011   Procedure: TRANSESOPHAGEAL ECHOCARDIOGRAM (TEE);  Surgeon: [name], MD;  Location: [facility] ENDOSCOPY;  Service: Cardiovascular;  Laterality: N/A;  . TONSILLECTOMY    . TUBAL LIGATION      No current facility-administered medications for this encounter.    No current outpatient medications on file.   Facility-Administered Medications Ordered in Other Encounters  Medication Dose Route Frequency Provider Last Rate Last Dose  . 0.9 %  sodium chloride infusion  250 mL Intravenous PRN Fenton, Clint R, PA      . atorvastatin (LIPITOR) tablet 80 mg  80 mg Oral q1800 Fenton, Clint R, PA   80 mg at 04/30/19 2154  . carvedilol (COREG) tablet 37.5 mg  37.5 mg Oral BID Fenton, Clint R, PA   37.5 mg at 05/01/19 0833  . diltiazem (CARDIZEM SR) 12 hr capsule 120 mg  120 mg Oral BID Fenton, Clint R, PA   120 mg at 05/01/19 0835  . dofetilide (TIKOSYN) capsule 500 mcg  500 mcg Oral BID Fenton, Clint R, PA   500 mcg at 05/01/19 0833  . famotidine (PEPCID) tablet 20 mg  20 mg Oral Daily PRN Fenton, Clint R, PA      . furosemide (LASIX) tablet 20 mg  20 mg Oral PRN Fenton, Clint R, PA      . sodium chloride flush (NS) 0.9 % injection 3 mL  3 mL Intravenous Q12H Fenton, Clint R, PA   3 mL at 05/01/19 0833  . sodium chloride flush (NS) 0.9 % injection 3 mL  3 mL Intravenous PRN Fenton, Clint R, PA      . vitamin B-12 (CYANOCOBALAMIN) tablet 1,000 mcg  1,000 mcg Oral Daily Fenton, Clint R, PA   1,000 mcg at 05/01/19 0833  . vitamin C (ASCORBIC ACID) tablet 500 mg  500 mg Oral BID Fenton, Clint R, PA   500 mg at 05/01/19 0833  . warfarin (COUMADIN) tablet 5 mg  5 mg Oral ONCE-1800 Kris Mouton, [facility]      . Warfarin - Pharmacist Dosing Inpatient   Does not apply q1800 Kris Mouton, [facility]        Allergies  Allergen Reactions  . Azithromycin Nausea Only  . Latex Rash    rash  . Spinach Hives    Social History    Socioeconomic History  . Marital status: Married    Spouse name: Not on file  . Number of children: 17  . Years of education: 2  . Highest education level: Not on file  Occupational History  . 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  Social Needs  . Financial resource strain: Not on file  . Food insecurity    Worry: Not on file    Inability: Not  on file  . Transportation needs    Medical: Not on file    Non-medical: Not on file  Tobacco Use  . Smoking status: Former Smoker    Packs/day: 1.00    Years: 30.00    Pack years: 30.00    Quit date: 05/07/2011    Years since quitting: 7.9  . Smokeless tobacco: Never Used  Substance and Sexual Activity  . Alcohol use: Yes    Alcohol/week: 1.0 standard drinks    Types: 1 Glasses of wine per week    Comment: 1 glass of red wine nightly  . Drug use: No  . Sexual activity: Yes  Lifestyle  . Physical activity    Days per week: Not on file    Minutes per session: Not on file  . Stress: Not on file  Relationships  . Social Herbalist on phone: Not on file    Gets together: Not on file    Attends religious service: Not on file    Active member of club or organization: Not on file    Attends meetings of clubs or organizations: Not on file    Relationship status: Not on file  . Intimate partner violence    Fear of current or ex partner: Not on file    Emotionally abused: Not on file    Physically abused: Not on file    Forced sexual activity: Not on file  Other Topics Concern  . Not on file  Social History Narrative   Lives at home with husband and son.   Right-handed.   2 cups caffeine daily.    ROS- All systems are reviewed and negative except as per the HPI above.  Physical Exam: Vitals:   04/30/19 1158  BP: (!) 142/80  Pulse: 72  Weight: 94.3 kg  Height: 5\' 8"  (1.727 m)    GEN- The patient is well appearing obese female, alert and oriented x 3 today.   Head-  normocephalic, atraumatic Eyes-  Sclera clear, conjunctiva pink Ears- hearing intact Oropharynx- clear Neck- supple  Lungs- Clear to ausculation bilaterally, normal work of breathing Heart- irregular rate and rhythm, no murmurs, rubs or gallops  GI- soft, NT, ND, + BS Extremities- no clubbing, cyanosis, or edema MS- no significant deformity or atrophy Skin- no rash or lesion Psych- euthymic mood, full affect Neuro- strength and sensation are intact  Wt Readings from Last 3 Encounters:  05/01/19 94.4 kg  04/30/19 94.3 kg  04/19/19 96.2 kg    EKG today demonstrates afib HR 72, inc RBBB, QRS 116, QTc 486  Echo 03/05/19 demonstrated  1. The left ventricle has normal systolic function, with an ejection fraction of 60-65%. The cavity size was normal.  2. The right ventricle has normal systolc function. The cavity was normal. There is no increase in right ventricular wall thickness.  3. Aortic valve regurgitation was not assessed by color flow Doppler.  4. Pulmonic valve regurgitation was not assessed by color flow Doppler.  Epic records are reviewed at length today  Assessment and Plan:  1. Persistent atrial fibrillation/atrial flutter Patient wants to pursue dofetilide, aware of risk vrs benefit Aware of price of dofetilide and was also given GOOD RX card Patient will continue on anticoagulation, states no missed doses No benadryl use PharmD has screened drugs and no QT prolonging drugs on board QTc in SR 420 ms, Labs today show creatinine at 0.9, K+ 3.4 and mag 2.0, CrCl calculated at 106 mL/min Patient to  get K+ supplementation prior to first dose of dofetilide.    This patients CHA2DS2-VASc Score and unadjusted Ischemic Stroke Rate (% per year) is equal to 9.7 % stroke rate/year from a score of 6  Above score calculated as 1 point each if present [CHF, HTN, DM, Vascular=MI/PAD/Aortic Plaque, Age if 65-74, or Female] Above score calculated as 2 points each if present [Age >  75, or Stroke/TIA/TE]  2. Hypokalemia K 3.4. Discussed with Dr. Rayann Heman. Will admit and supp aggressively in attempt to still start Tikosyn this evening.   3. Obesity Body mass index is 31.63 kg/m. Lifestyle modification was discussed at length including regular exercise and weight reduction.  4. CAD S/p CABG. No anginal symptoms today.  5. Combined chronic systolic/diastolic CHF Hopefully will improved with restoration of SR. Continue Coreg.  6. MVP with MR S/p repair 2013.  To be admitted for tikosyn load today. once bed becomes available.   Shirley Friar, PA-C  [facility] [address] [place], [place] [address] [phone] 04/30/2019

## 2019-05-02 ENCOUNTER — Telehealth: Payer: Self-pay

## 2019-05-02 DIAGNOSIS — I484 Atypical atrial flutter: Secondary | ICD-10-CM

## 2019-05-02 LAB — BASIC METABOLIC PANEL
Anion gap: 8 (ref 5–15)
BUN: 12 mg/dL (ref 6–20)
CO2: 23 mmol/L (ref 22–32)
Calcium: 8.5 mg/dL — ABNORMAL LOW (ref 8.9–10.3)
Chloride: 107 mmol/L (ref 98–111)
Creatinine, Ser: 0.84 mg/dL (ref 0.44–1.00)
GFR calc Af Amer: >60 mL/min (ref >60)
GFR calc non Af Amer: >60 mL/min (ref >60)
Glucose, Bld: 130 mg/dL — ABNORMAL HIGH (ref 70–99)
Potassium: 3.8 mmol/L (ref 3.5–5.1)
Sodium: 138 mmol/L (ref 135–145)

## 2019-05-02 LAB — PROTIME-INR
INR: 2 — ABNORMAL HIGH (ref 0.8–1.2)
Prothrombin Time: 22.6 seconds — ABNORMAL HIGH (ref 11.4–15.2)

## 2019-05-02 LAB — MAGNESIUM: Magnesium: 2.2 mg/dL (ref 1.7–2.4)

## 2019-05-02 MED ORDER — POTASSIUM CHLORIDE CRYS ER 20 MEQ PO TBCR
40.0000 meq | EXTENDED_RELEASE_TABLET | Freq: Once | ORAL | Status: AC
  Administered 2019-05-02: 12:00:00 40 meq via ORAL
  Filled 2019-05-02: qty 2

## 2019-05-02 MED ORDER — ACETAMINOPHEN 325 MG PO TABS
650.0000 mg | ORAL_TABLET | Freq: Four times a day (QID) | ORAL | Status: DC | PRN
  Administered 2019-05-02 – 2019-05-04 (×2): 650 mg via ORAL
  Filled 2019-05-02 (×2): qty 2

## 2019-05-02 MED ORDER — LOSARTAN POTASSIUM 25 MG PO TABS
25.0000 mg | ORAL_TABLET | Freq: Every day | ORAL | Status: DC
  Administered 2019-05-02 – 2019-05-06 (×5): 25 mg via ORAL
  Filled 2019-05-02 (×5): qty 1

## 2019-05-02 MED ORDER — DOFETILIDE 250 MCG PO CAPS
250.0000 ug | ORAL_CAPSULE | Freq: Two times a day (BID) | ORAL | Status: DC
  Administered 2019-05-02 (×2): 250 ug via ORAL
  Filled 2019-05-02 (×2): qty 1

## 2019-05-02 MED ORDER — WARFARIN SODIUM 5 MG PO TABS
6.0000 mg | ORAL_TABLET | Freq: Once | ORAL | Status: AC
  Administered 2019-05-02: 6 mg via ORAL
  Filled 2019-05-02: qty 1

## 2019-05-02 NOTE — Telephone Encounter (Signed)
Message received from Page Spiro CM requesting appointment for patient at [facility]. Patient has no PCP.  Informed her that an appointment has been scheduled for 05/13/2019 @1430 .  Also informed Hassan Rowan that [facility] does not carry tikosyn and will not be able to obtain it for the patient because the pharmacy is not enrolled in a [facility] program.

## 2019-05-02 NOTE — Progress Notes (Addendum)
Electrophysiology Rounding Note  Patient Name: Ashley [name] Date of Encounter: 05/02/2019  Electrophysiologist: Caryl Comes   Subjective   The patient is doing well today.  At this time, the patient denies chest pain, shortness of breath. She has some dizziness with tachycardia   Inpatient Medications    Scheduled Meds: . atorvastatin  80 mg Oral q1800  . carvedilol  37.5 mg Oral BID  . diltiazem  120 mg Oral Daily  . dofetilide  500 mcg Oral BID  . sodium chloride flush  3 mL Intravenous Q12H  . vitamin B-12  1,000 mcg Oral Daily  . vitamin C  500 mg Oral BID  . Warfarin - Pharmacist Dosing Inpatient   Does not apply q1800   Continuous Infusions: . sodium chloride     PRN Meds: sodium chloride, acetaminophen, famotidine, furosemide, sodium chloride flush   Vital Signs    Vitals:   05/01/19 1329 05/01/19 2012 05/01/19 2013 05/02/19 0525  BP: 124/73 130/81 130/81 (!) 143/90  Pulse: 66 77 76 78  Resp: 14     Temp: 98.5 F (36.9 C) 98.3 F (36.8 C) 98.3 F (36.8 C) 98.5 F (36.9 C)  TempSrc: Oral Oral Oral Oral  SpO2: 96% 98% 98% 92%  Weight:        Intake/Output Summary (Last 24 hours) at 05/02/2019 1058 Last data filed at 05/01/2019 1750 Gross per 24 hour  Intake 480 ml  Output -  Net 480 ml   Filed Weights   05/01/19 0500  Weight: 94.4 kg    Physical Exam    GEN- The patient is obese appearing, alert and oriented x 3 today.   Head- normocephalic, atraumatic Eyes-  Sclera clear, conjunctiva pink Ears- hearing intact Oropharynx- clear Neck- supple Lungs- Clear to ausculation bilaterally, normal work of breathing Heart- Irregular rate and rhythm  GI- soft, NT, ND, + BS Extremities- no clubbing, cyanosis, or edema Skin- no rash or lesion Psych- euthymic mood, full affect Neuro- strength and sensation are intact  Labs    Basic Metabolic Panel Recent Labs    05/01/19 0338 05/01/19 0735 05/02/19 0341  NA  --  137 138  K  --  4.6 3.8  CL   --  105 107  CO2  --  23 23  GLUCOSE  --  111* 130*  BUN  --  8 12  CREATININE  --  0.74 0.84  CALCIUM  --  8.8* 8.5*  MG 1.9  --  2.2     Telemetry    SR, junctional rhythm, flutter, AF (personally reviewed)  Radiology    No results found.   Patient Profile     Ashley [name] is a 53 y.o. female admitted for Tikosyn load   Assessment & Plan    1.  Persistent atrial fibrillation/atrial flutter QTc a little long last night Decrease Tikosyn to 225mcg twice daily Start Losartan 25mg  daily NPO after midnight tonight for possible DCCV tomorrow if not converted back to SR    For questions or updates, please contact [place] Please consult [email or web address] for contact info under Cardiology/STEMI.  Signed, Chanetta Marshall, NP  05/02/2019, 10:58 AM    I have seen, examined the patient, and reviewed the above assessment and plan.  Changes to above are made where necessary.  On exam,iRRR.  QT has lengthened.  Unfortunately, she is in atypical atrial flutter today.  Reduce tikosyn to 219mcg BID.  Continue to follow Ultimately, we may need to consider  ablation.  NPO for possible cardioversion tomorrow  Co Sign: [name], MD 05/02/2019 6:12 PM

## 2019-05-02 NOTE — Plan of Care (Signed)
  Problem: Cardiac: Goal: Ability to achieve and maintain adequate cardiopulmonary perfusion will improve Outcome: Progressing   

## 2019-05-02 NOTE — Plan of Care (Signed)
  Problem: Activity: Goal: Ability to tolerate increased activity will improve Outcome: Progressing   Problem: Education: Goal: Knowledge of disease or condition will improve Outcome: Progressing Goal: Understanding of medication regimen will improve Outcome: Progressing Goal: Individualized Educational Video(s) Outcome: Progressing

## 2019-05-02 NOTE — Progress Notes (Signed)
Pharmacy: Dofetilide (Tikosyn) - Follow Up Assessment and Electrolyte Replacement  Pharmacy consulted to assist in monitoring and replacing electrolytes in this 53 y.o. female admitted on 04/30/2019 undergoing dofetilide initiation. First dofetilide dose: 500 mg  Labs:    Component Value Date/Time   K 3.8 05/02/2019 0341   MG 2.2 05/02/2019 0341     Plan: Potassium: K 3.8-3.9:  Give KCl 40 mEq po x1   Magnesium: Mg > 2: No additional supplementation needed   Thank you for allowing pharmacy to participate in this patient's care   Marguerite Olea, [place] Pharmacist Phone [phone]  05/02/2019 11:17 AM

## 2019-05-02 NOTE — Progress Notes (Signed)
   Contacted by RN regarding prolonged QTc.  Last p.m., QTC was 551 ms.  Tikosyn dose was reduced from 500 mcg twice daily down to 250 mcg twice daily.  250 mcg dose was given this a.m.  QTC is now 508 ms  At about 3 AM, Ashley [name] had gone back into atrial flutter. She had an episode of one-to-one conduction prior to receiving the Tikosyn.  At approximately 2:45 PM, she converted to sinus rhythm with a 4.99-second pause.  She had subsequent junctional bradycardia with PVCs at times.  She then went into sinus rhythm.  She had some shorter pauses between 2-3.9 seconds, but maintained sinus rhythm since then.  She got 37.5 mg of Coreg this a.m., is to get the same this p.m.  She got diltiazem CD 120 mg daily this a.m. plus the Tikosyn.  The nurse was wondering if he should give the p.m. dose of Tikosyn and the p.m. dose of Coreg.  Spoke with Dr. Caryl Comes by phone.  He does not wish to change the Tikosyn dose, or the carvedilol dose.  He mentioned that her atrial volume would have to be assessed. He also mentioned that she may need a pacemaker. He feels that the atrial flutter carries enough potential harm that he wishes to continue the medications at the current levels to keep her in sinus rhythm. As long as she is in sinus rhythm, her rate is okay.  Continue to follow on telemetry, reassess by EP in a.m.  Dr. Caryl Comes told me this over the phone, he is out of town and cannot round.  I will re-sign this round to Dr. Curt Bears for tomorrow.  Ashley Ferries, PA-C 05/02/2019 6:26 PM Bee[place] [phone]

## 2019-05-02 NOTE — Progress Notes (Signed)
[facility] for coumadin Indication: atrial fibrillation  Allergies  Allergen Reactions  . Azithromycin Nausea Only  . Latex Rash    rash  . Spinach Hives    Patient Measurements:   Vital Signs: Temp: 98.5 F (36.9 C) (09/10 0525) Temp Source: Oral (09/10 0525) BP: 143/90 (09/10 0525) Pulse Rate: 78 (09/10 0525)  Labs: Recent Labs    04/30/19 1103  04/30/19 1939 05/01/19 0338 05/01/19 0735 05/02/19 0341  LABPROT  --   --   --  28.9*  --  22.6*  INR 3.9*  --   --  2.8*  --  2.0*  CREATININE  --    < > 0.80  --  0.74 0.84   < > = values in this interval not displayed.    Estimated Creatinine Clearance: 92 mL/min (by C-G formula based on SCr of 0.84 mg/dL).   Medical History: Past Medical History:  Diagnosis Date  . Abdominal pain    ? chronic cholecystitis; admxn to [facility] 9/12 (CT, USN, HIDA done)  . Atrial fibrillation ([place])   . Cardiomyopathy    echo 9/12: mild LVH, EF 35-40%, mod MR (difficult to judge /post leaflet restricted), mod BAE, mod RVE, mod TR   . Cardiomyopathy ([place])    a. echo 9/12: mild LVH, EF 35-40%, mod MR (difficult to judge /post leaflet restricted), mod BAE, mod RVE, mod TR. b. Echo 11/2011: mild LVH, EF 55-60%, s/p MV repair w/o sig MR/MS, mildly dilated RV/RA, PASP 88mmHg. // AFL dx 10/2018 - Echo 10/2018: EF 30, diff HK, s/p MV repair with trivial MR, mean MV 7 but not c/w significant mitral stenosis, mild RAE, severe LAE, mod reduced RVSF   . Carotid artery disease ([place])    a. Duplex 06/2015: 50% RECA, 1-39% BICA.  [name] Coronary artery disease    a. s/p CABG 10/2011 - LIMA-LAD, SVG-PDA, SVG-OM, SVG-diagonal.  . Dyslipidemia   . Essential hypertension   . Hemifacial spasm   . History of Doppler ultrasound    a. Carotid US [facility] in [place], [name]) 7/17: no hemodynamically significant ICA stenosis  . Mitral regurgitation    a. s/p MV repair 10/2011 - on Coumadin for 3 months afterwards then d/c'd by  surgery.  . Recurrent upper respiratory infection (URI)   . 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 Stroke [facility])    a. age 12 - in setting of cigs and OCPs. //  b. s/p acute L parietal CVA >> tPA - [facility] in [place], [name]  . Tobacco abuse     Medications:  Facility-Administered Medications Prior to Admission  Medication Dose Route Frequency Provider Last Rate Last Dose  . incobotulinumtoxinA (XEOMIN) 50 units injection 50 Units  50 Units Intramuscular Q90 days Marcial Pacas, MD   50 Units at 11/30/16 1546  . incobotulinumtoxinA (XEOMIN) 50 units injection 50 Units  50 Units Intramuscular Q90 days Marcial Pacas, MD   50 Units at 07/25/17 [phone]  . incobotulinumtoxinA (XEOMIN) 50 units injection 50 Units  50 Units Intramuscular Q90 days Marcial Pacas, MD   50 Units at 07/26/17 1217   Medications Prior to Admission  Medication Sig Dispense Refill Last Dose  . atorvastatin (LIPITOR) 80 MG tablet Take 1 tablet (80 mg total) by mouth daily at 6 PM. 90 tablet 3 04/29/2019 at Unknown time  . carvedilol (COREG) 25 MG tablet Take 1.5 tablets (37.5 mg total) by  mouth 2 (two) times daily. 270 tablet 3 04/30/2019 at 1000  . diltiazem (CARDIZEM SR) 120 MG 12 hr capsule Take 1 capsule (120 mg total) by mouth 2 (two) times daily. 180 capsule 3 04/30/2019 at Unknown time  . famotidine (PEPCID) 20 MG tablet Take 20 mg by mouth daily as needed. For indigestion   Past Week at Unknown time  . furosemide (LASIX) 20 MG tablet TAKE 1 TABLET BY MOUTH AS NEEDED FOR  SWELLING (Patient taking differently: Take 20 mg by mouth daily as needed for fluid or edema. ) 90 tablet 2 Past Week at Unknown time  . vitamin B-12 (CYANOCOBALAMIN) 1000 MCG tablet Take 1 tablet (1,000 mcg total) by mouth daily.   04/30/2019 at Unknown time  . vitamin C (ASCORBIC ACID) 500 MG tablet Take 500 mg by mouth 2 (two) times daily.   Past Week at Unknown time  . warfarin (COUMADIN) 5 MG tablet Take 1 tablet (5 mg total) by mouth  daily. 30 tablet 1 04/30/2019 at 1000    Assessment: 53 yo female here for Tikosyn initiation. Pharmacy consulted to dose warfarin -INR= 2.8 > 2.0  Home dose: 5mg /day except 2.5mg  on SuWeFr (last visit 9/8)  Goal of Therapy:  INR 2-3 Monitor platelets by anticoagulation protocol: Yes   Plan:  -Warfarin 6 mg po today -Daily PT/INR  Marguerite Olea, [facility] [place] Clinical Pharmacist Phone [phone]  05/02/2019 11:18 AM

## 2019-05-02 NOTE — TOC Initial Note (Signed)
Transition of Care [facility]) - Initial/Assessment Note    Patient Details  Name: Ashley [name] MRN: [id number] Date of Birth: Dec 25, 1965  Transition of Care [facility]) CM/SW Contact:    Bethena Roys, RN Phone Number: 05/02/2019, 7:01 PM  Clinical Narrative:  Pt presented for Tikosyn Load. Patient is without insurance and PCP at this time. Appointment was established at the [facility]- with MD Newlin. Patient is aware of appointment. CM will utilize [facility] for a one x 30 day free Tikosyn. Patient will need Tikosyn Rx for 7 day supply no refills and then a 30 day supply no refills to be sent to [facility] and a written Rx with refills for the community. Thereafter patient will utilize [employer] or other pharmacies via Good Rx assistance. Patient has access the app on her phone.                 Expected Discharge Plan: Home/Self Care Barriers to Discharge: No Barriers Identified   Patient Goals and CMS Choice Patient states their goals for this hospitalization and ongoing recovery are:: "To get home and be able to get my medications"   Choice offered to / list presented to : NA  Expected Discharge Plan and Services Expected Discharge Plan: Home/Self Care   Discharge Planning Services: CM Consult, [place] [facility], Medication Assistance Post Acute Care Choice: NA Living arrangements for the past 2 months: Single Family Home                 HH Arranged: NA      Prior Living Arrangements/Services Living arrangements for the past 2 months: Single Family Home Lives with:: Spouse Patient language and need for interpreter reviewed:: No Do you feel safe going back to the place where you live?: Yes        Care giver support system in place?: Yes (comment)   Criminal Activity/Legal Involvement Pertinent to Current Situation/Hospitalization: No - Comment as needed  Activities of Daily Living Home Assistive Devices/Equipment: None ADL Screening (condition at time of  admission) Patient's cognitive ability adequate to safely complete daily activities?: Yes Is the patient deaf or have difficulty hearing?: No Does the patient have difficulty seeing, even when wearing glasses/contacts?: No Does the patient have difficulty concentrating, remembering, or making decisions?: No Patient able to express need for assistance with ADLs?: Yes Does the patient have difficulty dressing or bathing?: No Independently performs ADLs?: Yes (appropriate for developmental age) Does the patient have difficulty walking or climbing stairs?: No Weakness of Legs: None Weakness of Arms/Hands: None  Permission Sought/Granted Permission sought to share information with : Family Supports, Chartered certified accountant granted to share information with : Yes, Verbal Permission Granted     Permission granted to share info w AGENCY: [place] [facility] follow up appointment.        Emotional Assessment Appearance:: Appears stated age Attitude/Demeanor/Rapport: Engaged Affect (typically observed): Accepting Orientation: : Oriented to Self, Oriented to Place, Oriented to  Time, Oriented to Situation Alcohol / Substance Use: Not Applicable Psych Involvement: No (comment)  Admission diagnosis:  A Fib Tikyson load Patient Active Problem List   Diagnosis Date Noted  . Atrial fibrillation ([place]) 03/22/2019  . Encounter for therapeutic drug monitoring 03/22/2019  . Persistent atrial fibrillation   . History of stroke 01/01/2018  . Former smoker 07/03/2017  . B12 deficiency 06/21/2016  . Hemifacial spasm 04/13/2016  . Cerebrovascular accident (CVA) due to thrombosis of cerebral artery ([place])   . Cardiomyopathy (  [place])   . Essential hypertension   . 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 Coronary artery disease 07/11/2011  . CAD (coronary artery disease) 06/21/2011  . Dyslipidemia 06/21/2011  . Mitral regurgitation  06/21/2011   PCP:  Patient, No Pcp Per Pharmacy:   [facility], [address]. [name] [address] Phone: [phone] Fax: [phone]     Social Determinants of Health (SDOH) Interventions    Readmission Risk Interventions No flowsheet data found.

## 2019-05-02 NOTE — Progress Notes (Signed)
Tikosyn changed to 250 mcg for am dose today. Qtc remains > 500 msec on f/u EKG this afternoon. Had compensatory pause 4.99 sec, as well as 3.6 and 2.9 sec pauses with Brady vs Junctional rhythm.Lauretta Chester, PA notified. Orders pending.

## 2019-05-03 LAB — BASIC METABOLIC PANEL
Anion gap: 6 (ref 5–15)
BUN: 11 mg/dL (ref 6–20)
CO2: 24 mmol/L (ref 22–32)
Calcium: 8.8 mg/dL — ABNORMAL LOW (ref 8.9–10.3)
Chloride: 108 mmol/L (ref 98–111)
Creatinine, Ser: 0.54 mg/dL (ref 0.44–1.00)
GFR calc Af Amer: >60 mL/min (ref >60)
GFR calc non Af Amer: >60 mL/min (ref >60)
Glucose, Bld: 122 mg/dL — ABNORMAL HIGH (ref 70–99)
Potassium: 4.3 mmol/L (ref 3.5–5.1)
Sodium: 138 mmol/L (ref 135–145)

## 2019-05-03 LAB — PROTIME-INR
INR: 1.8 — ABNORMAL HIGH (ref 0.8–1.2)
Prothrombin Time: 20.5 seconds — ABNORMAL HIGH (ref 11.4–15.2)

## 2019-05-03 LAB — MAGNESIUM: Magnesium: 2.2 mg/dL (ref 1.7–2.4)

## 2019-05-03 MED ORDER — DOFETILIDE 125 MCG PO CAPS
125.0000 ug | ORAL_CAPSULE | Freq: Two times a day (BID) | ORAL | Status: DC
  Administered 2019-05-03 – 2019-05-04 (×3): 125 ug via ORAL
  Filled 2019-05-03 (×3): qty 1

## 2019-05-03 MED ORDER — WARFARIN SODIUM 7.5 MG PO TABS
7.5000 mg | ORAL_TABLET | Freq: Once | ORAL | Status: AC
  Administered 2019-05-03: 18:00:00 7.5 mg via ORAL
  Filled 2019-05-03: qty 1

## 2019-05-03 NOTE — Plan of Care (Signed)
  Problem: Education: Goal: Understanding of medication regimen will improve Outcome: Progressing Note: Patient verbalizes understanding of med regimen.

## 2019-05-03 NOTE — Progress Notes (Signed)
QTc after tonight's tikosyn dose is 0.508.  NSR on telemetry.  VSS.  Will continue to monitor.  Jodell Cipro

## 2019-05-03 NOTE — Progress Notes (Addendum)
   Electrophysiology Rounding Note  Patient Name: Ashley [name] Date of Encounter: 05/03/2019  Electrophysiologist: Caryl Comes   Subjective   The patient is doing well today.  At this time, the patient denies chest pain, shortness of breath, or any new concerns.  Inpatient Medications    Scheduled Meds: . atorvastatin  80 mg Oral q1800  . carvedilol  37.5 mg Oral BID  . diltiazem  120 mg Oral Daily  . dofetilide  125 mcg Oral BID  . losartan  25 mg Oral Daily  . sodium chloride flush  3 mL Intravenous Q12H  . vitamin B-12  1,000 mcg Oral Daily  . vitamin C  500 mg Oral BID  . warfarin  7.5 mg Oral ONCE-1800  . Warfarin - Pharmacist Dosing Inpatient   Does not apply q1800   Continuous Infusions: . sodium chloride     PRN Meds: sodium chloride, acetaminophen, famotidine, furosemide, sodium chloride flush   Vital Signs    Vitals:   05/02/19 1300 05/02/19 1424 05/02/19 1455 05/02/19 2033  BP:  (!) 119/96 (!) 114/93 (!) 154/88  Pulse: (!) 130  90 83  Resp:    18  Temp:   98.5 F (36.9 C) 98.6 F (37 C)  TempSrc:   Oral Oral  SpO2:   96% 98%  Weight:        Intake/Output Summary (Last 24 hours) at 05/03/2019 0825 Last data filed at 05/02/2019 2100 Gross per 24 hour  Intake 840 ml  Output -  Net 840 ml   Filed Weights   05/01/19 0500  Weight: 94.4 kg    Physical Exam    GEN- The patient is well appearing, alert and oriented x 3 today.   Head- normocephalic, atraumatic Eyes-  Sclera clear, conjunctiva pink Ears- hearing intact Oropharynx- clear Neck- supple Lungs- Clear to ausculation bilaterally, normal work of breathing Heart- Regular rate and rhythm  GI- soft, NT, ND, + BS Extremities- no clubbing, cyanosis, or edema Skin- no rash or lesion Psych- euthymic mood, full affect Neuro- strength and sensation are intact  Labs    Basic Metabolic Panel Recent Labs    05/02/19 0341 05/03/19 0324  NA 138 138  K 3.8 4.3  CL 107 108  CO2 23 24   GLUCOSE 130* 122*  BUN 12 11  CREATININE 0.84 0.54  CALCIUM 8.5* 8.8*  MG 2.2 2.2    Telemetry    SR, PVC's (personally reviewed)  Radiology    No results found.   Patient Profile     Ashley [name] is a 53 y.o. female admitted for tikosyn load  Assessment & Plan    1.  Persistent atrial fibrillation/ atrial flutter Converted back to SR yesterday QTc a little long this morning Will decrease Tikosyn dose to 183mcg and watch on telemetry overnight tonight If she fails Tikosyn, would consider ablation CHADS2VASC is 6, continue Warfarin   For questions or updates, please contact [facility] Please consult [email or web address] for contact info under Cardiology/STEMI.  Signed, Chanetta Marshall, NP  05/03/2019, 8:25 AM    I have seen, examined the patient, and reviewed the above assessment and plan.  Changes to above are made where necessary.  On exam, RRR. Continue tikosyn at 143mcg BID  Co Sign: [name], MD

## 2019-05-03 NOTE — Progress Notes (Signed)
Pharmacy: Dofetilide (Tikosyn) - Follow Up Assessment and Electrolyte Replacement  Pharmacy consulted to assist in monitoring and replacing electrolytes in this 53 y.o. female admitted on 04/30/2019 undergoing dofetilide re-initiation. First dofetilide dose: 500 mcg > reduced to 250 mcg > reduced to 125 mcg due to long QTc.  Labs:    Component Value Date/Time   K 4.3 05/03/2019 0324   MG 2.2 05/03/2019 0324     Plan: Potassium: K >/= 4: No additional supplementation needed  Magnesium: Mg > 2: No additional supplementation needed   Thank you for allowing pharmacy to participate in this patient's care   Marguerite Olea, [place] Pharmacist Phone [phone]  05/03/2019 7:37 AM

## 2019-05-03 NOTE — Progress Notes (Signed)
[facility] for coumadin Indication: atrial fibrillation  Allergies  Allergen Reactions  . Azithromycin Nausea Only  . Latex Rash    rash  . Spinach Hives    Patient Measurements:   Vital Signs: Temp: 98.6 F (37 C) (09/10 2033) Temp Source: Oral (09/10 2033) BP: 154/88 (09/10 2033) Pulse Rate: 83 (09/10 2033)  Labs: Recent Labs    05/01/19 0338 05/01/19 0735 05/02/19 0341 05/03/19 0324  LABPROT 28.9*  --  22.6* 20.5*  INR 2.8*  --  2.0* 1.8*  CREATININE  --  0.74 0.84 0.54    Estimated Creatinine Clearance: 96.6 mL/min (by C-G formula based on SCr of 0.54 mg/dL).   Medical History: Past Medical History:  Diagnosis Date  . Abdominal pain    ? chronic cholecystitis; admx[facility] to [facility] 9/12 (CT, USN, HIDA done)  . Atrial fibrillation ([place])   . Cardiomyopathy    echo 9/12: mild LVH, EF 35-40%, mod MR (difficult to judge /post leaflet restricted), mod BAE, mod RVE, mod TR   . Cardiomyopathy ([place])    a. echo 9/12: mild LVH, EF 35-40%, mod MR (difficult to judge /post leaflet restricted), mod BAE, mod RVE, mod TR. b. Echo 11/2011: mild LVH, EF 55-60%, s/p MV repair w/o sig MR/MS, mildly dilated RV/RA, PASP 103mmHg. // AFL dx 10/2018 - Echo 10/2018: EF 30, diff HK, s/p MV repair with trivial MR, mean MV 7 but not c/w significant mitral stenosis, mild RAE, severe LAE, mod reduced RVSF   . Carotid artery disease ([place])    a. Duplex 06/2015: 50% RECA, 1-39% BICA.  [name] Coronary artery disease    a. s/p CABG 10/2011 - LIMA-LAD, SVG-PDA, SVG-OM, SVG-diagonal.  . Dyslipidemia   . Essential hypertension   . Hemifacial spasm   . History of Doppler ultrasound    a. Carotid US [facility] in [place], [name]) 7/17: no hemodynamically significant ICA stenosis  . Mitral regurgitation    a. s/p MV repair 10/2011 - on Coumadin for 3 months afterwards then d/c'd by surgery.  . Recurrent upper respiratory infection (URI)   . 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 S/P  mitral valve repair 10/21/2011  . Stroke [facility])    a. age 34 - in setting of cigs and OCPs. //  b. s/p acute L parietal CVA >> tPA - [facility] in [place], [name]  . Tobacco abuse     Medications:  Facility-Administered Medications Prior to Admission  Medication Dose Route Frequency Provider Last Rate Last Dose  . incobotulinumtoxinA (XEOMIN) 50 units injection 50 Units  50 Units Intramuscular Q90 days Marcial Pacas, MD   50 Units at 11/30/16 1546  . incobotulinumtoxinA (XEOMIN) 50 units injection 50 Units  50 Units Intramuscular Q90 days Marcial Pacas, MD   50 Units at 07/25/17 [phone]  . incobotulinumtoxinA (XEOMIN) 50 units injection 50 Units  50 Units Intramuscular Q90 days Marcial Pacas, MD   50 Units at 07/26/17 1217   Medications Prior to Admission  Medication Sig Dispense Refill Last Dose  . atorvastatin (LIPITOR) 80 MG tablet Take 1 tablet (80 mg total) by mouth daily at 6 PM. 90 tablet 3 04/29/2019 at Unknown time  . carvedilol (COREG) 25 MG tablet Take 1.5 tablets (37.5 mg total) by mouth 2 (two) times daily. 270 tablet 3 04/30/2019 at 1000  . diltiazem (CARDIZEM SR) 120 MG 12 hr capsule Take 1 capsule (120 mg total) by mouth 2 (two) times daily. 180 capsule  3 04/30/2019 at Unknown time  . famotidine (PEPCID) 20 MG tablet Take 20 mg by mouth daily as needed. For indigestion   Past Week at Unknown time  . furosemide (LASIX) 20 MG tablet TAKE 1 TABLET BY MOUTH AS NEEDED FOR  SWELLING (Patient taking differently: Take 20 mg by mouth daily as needed for fluid or edema. ) 90 tablet 2 Past Week at Unknown time  . vitamin B-12 (CYANOCOBALAMIN) 1000 MCG tablet Take 1 tablet (1,000 mcg total) by mouth daily.   04/30/2019 at Unknown time  . vitamin C (ASCORBIC ACID) 500 MG tablet Take 500 mg by mouth 2 (two) times daily.   Past Week at Unknown time  . warfarin (COUMADIN) 5 MG tablet Take 1 tablet (5 mg total) by mouth daily. 30 tablet 1 04/30/2019 at 1000    Assessment: 53 yo female here for Tikosyn  initiation. Pharmacy consulted to dose warfarin -INR= 2.8 > 2.0 > 1.8  Held one dose of warfarin day of admission due to INR of 3.9.  In retrospect, I wonder if that could have been erroneous.  Home dose: 5mg /day except 2.5mg  on SuWeFr (last visit 9/8)  Goal of Therapy:  INR 2-3 Monitor platelets by anticoagulation protocol: Yes   Plan:  -Warfarin 7.5 mg po today -Daily PT/INR  Marguerite Olea, [facility] Clinical Pharmacist Phone [phone]  05/03/2019 7:35 AM

## 2019-05-04 DIAGNOSIS — I455 Other specified heart block: Secondary | ICD-10-CM

## 2019-05-04 LAB — PROTIME-INR
INR: 1.8 — ABNORMAL HIGH (ref 0.8–1.2)
Prothrombin Time: 20.3 seconds — ABNORMAL HIGH (ref 11.4–15.2)

## 2019-05-04 LAB — BASIC METABOLIC PANEL WITH GFR
Anion gap: 9 (ref 5–15)
BUN: 8 mg/dL (ref 6–20)
CO2: 23 mmol/L (ref 22–32)
Calcium: 9.1 mg/dL (ref 8.9–10.3)
Chloride: 108 mmol/L (ref 98–111)
Creatinine, Ser: 0.72 mg/dL (ref 0.44–1.00)
GFR calc Af Amer: >60 mL/min
GFR calc non Af Amer: >60 mL/min
Glucose, Bld: 107 mg/dL — ABNORMAL HIGH (ref 70–99)
Potassium: 4.1 mmol/L (ref 3.5–5.1)
Sodium: 140 mmol/L (ref 135–145)

## 2019-05-04 LAB — MAGNESIUM: Magnesium: 2 mg/dL (ref 1.7–2.4)

## 2019-05-04 MED ORDER — WARFARIN SODIUM 2 MG PO TABS: 2.0000 mg | ORAL_TABLET | Freq: Once | ORAL | Status: DC

## 2019-05-04 MED ORDER — WARFARIN SODIUM 7.5 MG PO TABS
7.5000 mg | ORAL_TABLET | Freq: Once | ORAL | Status: AC
  Administered 2019-05-04: 7.5 mg via ORAL
  Filled 2019-05-04: qty 1

## 2019-05-04 MED ORDER — LOSARTAN POTASSIUM 25 MG PO TABS: 25.0000 mg | ORAL_TABLET | Freq: Every day | ORAL | 3 refills | Status: DC

## 2019-05-04 MED ORDER — DOFETILIDE 125 MCG PO CAPS: 125.0000 ug | ORAL_CAPSULE | Freq: Two times a day (BID) | ORAL | 0 refills | Status: DC

## 2019-05-04 MED ORDER — MAGNESIUM SULFATE 2 GM/50ML IV SOLN: 2.0000 g | Freq: Once | INTRAVENOUS | Status: DC

## 2019-05-04 MED ORDER — DOFETILIDE 125 MCG PO CAPS: 125.0000 ug | ORAL_CAPSULE | Freq: Two times a day (BID) | ORAL | 3 refills | Status: DC

## 2019-05-04 NOTE — Progress Notes (Signed)
Called by nursing for patient with rates in the 140s then dropping into the 30s with long pauses. Pt remains asymptomatic. In consultation with Dr. Rayann Heman, will D/C tikosyn and watch her overnight.  I held her coreg for tonight. She has already received cardizem. I have notified the overnight cardiology fellow.    Ledora Bottcher, PA-C 05/04/2019, 5:12 PM [facility] [address] [place], [place] [address]

## 2019-05-04 NOTE — Significant Event (Signed)
Rapid Response Event Note  Overview: Cardiac - HR   Initial Focused Assessment: Called by 6E nurse with concerns of patient's HR being in the 140-150s with periods of pauses/bradycardia into the 30s. Nurse had already reached to [place] APP/MD. Upon arrival, patient was laying bed, alert and oriented, pleasantly conversant, skin - warm/dry, + pulses. While I was with the patient, she had a few slow beats, HR dropped to 60 from the 130-140s but then quickly came up to the upper 90s. Patient remained asymptomatic. Per patient, sometimes she gets dizzy when it happens but not while she laying in the bed. SBP 130s-160s. Not in acute distress. EKG was done.   Interventions: - No RRT Interventions  Plan of Care: - MD d/c Tikosyn, Coreg held for tonight.  - Monitor HR/BP, advise patient to get up with staff for now   Event Summary:  Call Time 1657 Arrival Time 1701 End Time 1730   Ashley [name] R

## 2019-05-04 NOTE — Discharge Summary (Signed)
Discharge Summary    Patient ID: Ashley [name] MRN: [id number]; DOB: [birth year]  Admit date: 04/30/2019 Discharge date: 05/04/2019  Primary Care Provider: Patient, No Pcp Per  Primary Cardiologist: Dorris Carnes, MD  Primary Electrophysiologist:  Virl Axe, MD   Discharge Diagnoses    Active Problems:   Persistent atrial fibrillation   Allergies Allergies  Allergen Reactions  . Azithromycin Nausea Only  . Latex Rash    rash  . Spinach Hives    Diagnostic Studies/Procedures    No new studies this admit, other than serial EKGs for dofetilide loading monitoring  Previous studies- 2D echo 7/20202 1. The left ventricle has normal systolic function, with an ejection fraction of 60-65%. The cavity size was normal. 2. The right ventricle has normal systolc function. The cavity was normal. There is no increase in right ventricular wall thickness. 3. Aortic valve regurgitation was not assessed by color flow Doppler. 4. Pulmonic valve regurgitation was not assessed by color flow Doppler.   History of Present Illness     Ashley [name] is a 53 y.o. female with a history of atrial fibrillation, atrial flutter w/ RVR in 10/2018 with associate LV dysfunction, CAD s/p CABG 2013, MVP with severe MR s/p repair, CVA, HTN, and tobacco abuse. She is on chronic coumadin therapy and INRs are followed at [facility].  She underwent DCCV for persistent atrial fibrillation in 6/20202 but had ERAF. She developed profound bradycardia and junctional rhythm and her Digoxin and Diltiazem was DC'd.Her rate was uncontrolled again and she was symptomatic. Diltiazem resumed. She then saw Dr. Caryl Comes who recommended dofetilide. She was seen in the AF Clinic on 04/30/19 and admission for dofetilide (Tikosyn) was arranged. Pt reported compliance w/ anticoagulation w/o any interruption in therapy and had therapeutic serial INRs.   Hospital Course     Prior to admit on 04/30/19, PharmD  screened home meds and there were  no QT prolonging drugs. Labs day of admit showed normal SCr at 0.9. K+ was 3.4. Mg 2.0. CrCl calculated at 106 mL/min. She was given a dose of K+ supplementation prior to first dose of dofetilide. EKG demonstrated afib w/ CVR 72 bpm, w/ incomplete RBBB, QRS 116 ms, QTc 486 ms.   She was initially started on loading dose of 500 mcg BID w/ daily K+ and Mg monitoring and serial EKGs for QTc monitoring. On 9/10, she was observed to have slight prolongation of her QTc and her dofetilide dose was reduced to 250 mcg BID.  On 05/02/19, she converted back to NSR but had further lengthening of her QTc and dose was further reduced down to 125 mcg bid. Repeat EKG on 05/04/19 reviewed with Dr. Rayann Heman and QT was measured at 490 ms.  On 9/12, pt was seen and examined by Dr. Rayann Heman and deemed stable for discharge.  She will f/u in the Afib Clinic. She also has f/u in Coumadin clinic on 05/08/19.   Consultants: none     Discharge Vitals Blood pressure (!) 164/86, pulse 76, temperature 98.9 F (37.2 C), temperature source Oral, resp. rate 18, weight 94.5 kg, last menstrual period 05/18/2015, SpO2 92 %.  Filed Weights   05/01/19 0500 05/04/19 0444  Weight: 94.4 kg 94.5 kg    Labs & Radiologic Studies    CBC No results for input(s): WBC, NEUTROABS, HGB, HCT, MCV, PLT in the last 72 hours. Basic Metabolic Panel Recent Labs    05/03/19 0324 05/04/19 0437 05/04/19 0949  NA 138 140  --  K 4.3 4.1  --   CL 108 108  --   CO2 24 23  --   GLUCOSE 122* 107*  --   BUN 11 8  --   CREATININE 0.54 0.72  --   CALCIUM 8.8* 9.1  --   MG 2.2  --  2.0   Liver Function Tests No results for input(s): AST, ALT, ALKPHOS, BILITOT, PROT, ALBUMIN in the last 72 hours. No results for input(s): LIPASE, AMYLASE in the last 72 hours. High Sensitivity Troponin:   No results for input(s): TROPONINIHS in the last 720 hours.  BNP Invalid input(s): POCBNP D-Dimer No results for input(s):  DDIMER in the last 72 hours. Hemoglobin A1C No results for input(s): HGBA1C in the last 72 hours. Fasting Lipid Panel No results for input(s): CHOL, HDL, LDLCALC, TRIG, CHOLHDL, LDLDIRECT in the last 72 hours. Thyroid Function Tests No results for input(s): TSH, T4TOTAL, T3FREE, THYROIDAB in the last 72 hours.  Invalid input(s): FREET3 _____________  No results found. Disposition   Pt is being discharged home today in good condition.  Follow-up Plans & Appointments    Follow-up Information    [name], MD Follow up on 05/13/2019.   Specialty: Family Medicine Why: @ 2:30 PM (Arrive by 2:15 PM)-Please arrive 15 minutes prior to your appointment. This will allow [place] to verify and update your medical record and ensure a full appointment for you within the time allotted. Contact information: [place] [address] [phone]        [facility] Follow up on 05/08/2019.   Specialty: Cardiology Why: 11:00 AM  Contact information: [address] [place] [phone]         Discharge Instructions    Diet - low sodium heart healthy   Complete by: As directed    Increase activity slowly   Complete by: As directed       Discharge Medications   Allergies as of 05/04/2019      Reactions   Azithromycin Nausea Only   Latex Rash   rash   Spinach Hives      Medication List    TAKE these medications   atorvastatin 80 MG tablet Commonly known as: LIPITOR Take 1 tablet (80 mg total) by mouth daily at 6 PM.   carvedilol 25 MG tablet Commonly known as: COREG Take 1.5 tablets (37.5 mg total) by mouth 2 (two) times daily.   diltiazem 120 MG 12 hr capsule Commonly known as: CARDIZEM SR Take 1 capsule (120 mg total) by mouth 2 (two) times daily.   dofetilide 125 MCG capsule Commonly known as: TIKOSYN Take 1 capsule (125 mcg total) by mouth 2 (two) times daily.   famotidine 20 MG  tablet Commonly known as: PEPCID Take 20 mg by mouth daily as needed. For indigestion   furosemide 20 MG tablet Commonly known as: LASIX TAKE 1 TABLET BY MOUTH AS NEEDED FOR  SWELLING What changed: See the new instructions.   losartan 25 MG tablet Commonly known as: COZAAR Take 1 tablet (25 mg total) by mouth daily. Start taking on: May 05, 2019   vitamin B-12 1000 MCG tablet Commonly known as: CYANOCOBALAMIN Take 1 tablet (1,000 mcg total) by mouth daily.   vitamin C 500 MG tablet Commonly known as: ASCORBIC ACID Take 500 mg by mouth 2 (two) times daily.   warfarin 5 MG tablet Commonly known as: COUMADIN Take as directed. If you are  unsure how to take this medication, talk to your nurse or doctor. Original instructions: Take 1 tablet (5 mg total) by mouth daily.        Acute coronary syndrome (MI, NSTEMI, STEMI, etc) this admission?: No.    Outstanding Labs/Studies   EKG next week in Afib clinic  Duration of Discharge Encounter   Greater than 30 minutes including physician time.  Signed, [place], PA 05/04/2019, 2:20 PM

## 2019-05-04 NOTE — Progress Notes (Signed)
Pharmacy: Dofetilide (Tikosyn) - Follow Up Assessment and Electrolyte Replacement  Pharmacy consulted to assist in monitoring and replacing electrolytes in this 53 y.o. female admitted on 04/30/2019 undergoing dofetilide re-initiation. First dofetilide dose: 500 mcg > reduced to 250 mcg > reduced to 125 mcg due to long QTc.  Labs:    Component Value Date/Time   K 4.1 05/04/2019 0437   MG 2.2 05/03/2019 0324     Plan: Potassium: K >/= 4: No additional supplementation needed  Magnesium: Mg > 2: No additional supplementation needed     Thank you for allowing pharmacy to participate in this patient's care.  Haruto Demaria L. Devin Going, PharmD, [place] PGY1 Pharmacy Resident 05/04/19      7:46 AM  Please check AMION for all [place] phone numbers After 10:00 PM, call the [facility] [phone]

## 2019-05-04 NOTE — Progress Notes (Addendum)
NCM assisted patient with Match Letter for medicaitons, she will [facility] to [facility] which the PA verified Phyllis Ginger is available at this pharmacy.

## 2019-05-04 NOTE — Plan of Care (Signed)

## 2019-05-04 NOTE — Progress Notes (Signed)
   Progress Note   Subjective   Doing well today, the patient denies CP or SOB.  No new concerns  Inpatient Medications    Scheduled Meds: . atorvastatin  80 mg Oral q1800  . carvedilol  37.5 mg Oral BID  . diltiazem  120 mg Oral Daily  . losartan  25 mg Oral Daily  . sodium chloride flush  3 mL Intravenous Q12H  . vitamin B-12  1,000 mcg Oral Daily  . vitamin C  500 mg Oral BID  . Warfarin - Pharmacist Dosing Inpatient   Does not apply q1800   Continuous Infusions: . sodium chloride     PRN Meds: sodium chloride, acetaminophen, famotidine, furosemide, sodium chloride flush   Vital Signs    Vitals:   05/04/19 0444 05/04/19 1030 05/04/19 1632 05/04/19 1701  BP: (!) 149/69 (!) 164/86 (!) 152/80 (!) 143/95  Pulse: 76  75   Resp: 18     Temp: 98.9 F (37.2 C)  98.6 F (37 C)   TempSrc: Oral  Oral   SpO2: 92%  98%   Weight: 94.5 kg       Intake/Output Summary (Last 24 hours) at 05/04/2019 1953 Last data filed at 05/03/2019 2100 Gross per 24 hour  Intake 240 ml  Output -  Net 240 ml   Filed Weights   05/01/19 0500 05/04/19 0444  Weight: 94.4 kg 94.5 kg    Telemetry    sinus - Personally Reviewed  Physical Exam   GEN- The patient is well appearing, alert and oriented x 3 today.   Head- normocephalic, atraumatic Eyes-  Sclera clear, conjunctiva pink Ears- hearing intact Oropharynx- clear Neck- supple, Lungs- Clear to ausculation bilaterally, normal work of breathing Heart- Regular rate and rhythm  GI- soft, NT, ND, + BS Extremities- no clubbing, cyanosis, or edema  MS- no significant deformity or atrophy Skin- no rash or lesion Psych- euthymic mood, full affect Neuro- strength and sensation are intact   Labs    Chemistry Recent Labs  Lab 05/02/19 0341 05/03/19 0324 05/04/19 0437  NA 138 138 140  K 3.8 4.3 4.1  CL 107 108 108  CO2 23 24 23   GLUCOSE 130* 122* 107*  BUN 12 11 8   CREATININE 0.84 0.54 0.72  CALCIUM 8.5* 8.8* 9.1  GFRNONAA  >60 >60 >60  GFRAA >60 >60 >60  ANIONGAP 8 6 9         Assessment & Plan    1.  Persistent afib The patient was planned for discharge today.  Unfortunately, she has returned to afib and has had multiple post termination pauses this afternoon.  Her discharge was therefore cancelled.  QT will not allow additional tiksoyn.  I will therefore stop tikosyn at this time.  2. Sinus pauses Post termination pauses noted Stop tikosyn and reassess in am Does not require temporary wire at this time  Cancel discharge.  I will see again in am  [name] MD, [facility] 05/04/2019 7:53 PM

## 2019-05-04 NOTE — Progress Notes (Signed)
[place] for coumadin Indication: atrial fibrillation  Allergies  Allergen Reactions  . Azithromycin Nausea Only  . Latex Rash    rash  . Spinach Hives    Patient Measurements:   Vital Signs: Temp: 98.9 F (37.2 C) (09/12 0444) Temp Source: Oral (09/12 0444) BP: 149/69 (09/12 0444) Pulse Rate: 76 (09/12 0444)  Labs: Recent Labs    05/02/19 0341 05/03/19 0324 05/04/19 0437  LABPROT 22.6* 20.5* 20.3*  INR 2.0* 1.8* 1.8*  CREATININE 0.84 0.54 0.72    Estimated Creatinine Clearance: 96.6 mL/min (by C-G formula based on SCr of 0.72 mg/dL).   Medical History: Past Medical History:  Diagnosis Date  . Abdominal pain    ? chronic cholecystitis; admxn to [facility] 9/12 (CT, USN, HIDA done)  . Atrial fibrillation ([place])   . Cardiomyopathy    echo 9/12: mild LVH, EF 35-40%, mod MR (difficult to judge /post leaflet restricted), mod BAE, mod RVE, mod TR   . Cardiomyopathy ([place])    a. echo 9/12: mild LVH, EF 35-40%, mod MR (difficult to judge /post leaflet restricted), mod BAE, mod RVE, mod TR. b. Echo 11/2011: mild LVH, EF 55-60%, s/p MV repair w/o sig MR/MS, mildly dilated RV/RA, PASP 32mmHg. // AFL dx 10/2018 - Echo 10/2018: EF 30, diff HK, s/p MV repair with trivial MR, mean MV 7 but not c/w significant mitral stenosis, mild RAE, severe LAE, mod reduced RVSF   . Carotid artery disease ([place])    a. Duplex 06/2015: 50% RECA, 1-39% BICA.  [name] Coronary artery disease    a. s/p CABG 10/2011 - LIMA-LAD, SVG-PDA, SVG-OM, SVG-diagonal.  . Dyslipidemia   . Essential hypertension   . Hemifacial spasm   . History of Doppler ultrasound    a. Carotid US [facility] in [place], [name]) 7/17: no hemodynamically significant ICA stenosis  . Mitral regurgitation    a. s/p MV repair 10/2011 - on Coumadin for 3 months afterwards then d/c'd by surgery.  . Recurrent upper respiratory infection (URI)   . 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 Stroke [facility])    a. age 70 - in setting of cigs and OCPs. //  b. 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 Tobacco abuse     Medications:  Facility-Administered Medications Prior to Admission  Medication Dose Route Frequency Provider Last Rate Last Dose  . incobotulinumtoxinA (XEOMIN) 50 units injection 50 Units  50 Units Intramuscular Q90 days Marcial Pacas, MD   50 Units at 11/30/16 1546  . incobotulinumtoxinA (XEOMIN) 50 units injection 50 Units  50 Units Intramuscular Q90 days Marcial Pacas, MD   50 Units at 07/25/17 [phone]  . incobotulinumtoxinA (XEOMIN) 50 units injection 50 Units  50 Units Intramuscular Q90 days Marcial Pacas, MD   50 Units at 07/26/17 1217   Medications Prior to Admission  Medication Sig Dispense Refill Last Dose  . atorvastatin (LIPITOR) 80 MG tablet Take 1 tablet (80 mg total) by mouth daily at 6 PM. 90 tablet 3 04/29/2019 at Unknown time  . carvedilol (COREG) 25 MG tablet Take 1.5 tablets (37.5 mg total) by mouth 2 (two) times daily. 270 tablet 3 04/30/2019 at 1000  . diltiazem (CARDIZEM SR) 120 MG 12 hr capsule Take 1 capsule (120 mg total) by mouth 2 (two) times daily. 180 capsule 3 04/30/2019 at Unknown time  . famotidine (PEPCID) 20 MG  tablet Take 20 mg by mouth daily as needed. For indigestion   Past Week at Unknown time  . furosemide (LASIX) 20 MG tablet TAKE 1 TABLET BY MOUTH AS NEEDED FOR  SWELLING (Patient taking differently: Take 20 mg by mouth daily as needed for fluid or edema. ) 90 tablet 2 Past Week at Unknown time  . vitamin B-12 (CYANOCOBALAMIN) 1000 MCG tablet Take 1 tablet (1,000 mcg total) by mouth daily.   04/30/2019 at Unknown time  . vitamin C (ASCORBIC ACID) 500 MG tablet Take 500 mg by mouth 2 (two) times daily.   Past Week at Unknown time  . warfarin (COUMADIN) 5 MG tablet Take 1 tablet (5 mg total) by mouth daily. 30 tablet 1 04/30/2019 at 1000    Assessment: 53 yo female here for Tikosyn initiation. Pharmacy consulted to  dose warfarin.  Held one dose of warfarin day of admission due to INR of 3.9; may have been erroneous.  - INR = 2.8 > 2.0 > 1.8 > 1.8   Home dose: 5mg /day except 2.5mg  on SuWeFr (last visit 9/8)  Goal of Therapy:  INR 2-3 Monitor platelets by anticoagulation protocol: Yes   Plan:  - Warfarin 7.5 mg po today - Daily PT/INR    Thank you for allowing pharmacy to participate in this patient's care.  Kazumi Lachney L. Devin Going, PharmD, [place] PGY1 Pharmacy Resident 05/04/19      8:03 AM  Please check AMION for all [place] phone numbers After 10:00 PM, call the [place] [phone]

## 2019-05-04 NOTE — Progress Notes (Signed)
At [place] notified of HR in 130's-140's.  Asked NT to obtain EKG.  During EKG, RN notified of 3.69 second pause with 2 beats then another 2.59 second pause followed by one beat and a 2.1 second pause. Patient asymptomatic.  Doreene Adas, PA notified.  She notified Dr. Rayann Heman and stated to discharge patient.

## 2019-05-05 DIAGNOSIS — I519 Heart disease, unspecified: Secondary | ICD-10-CM

## 2019-05-05 DIAGNOSIS — R001 Bradycardia, unspecified: Secondary | ICD-10-CM

## 2019-05-05 LAB — BASIC METABOLIC PANEL
Anion gap: 6 (ref 5–15)
BUN: 9 mg/dL (ref 6–20)
CO2: 26 mmol/L (ref 22–32)
Calcium: 9.4 mg/dL (ref 8.9–10.3)
Chloride: 108 mmol/L (ref 98–111)
Creatinine, Ser: 0.7 mg/dL (ref 0.44–1.00)
GFR calc Af Amer: >60 mL/min (ref >60)
GFR calc non Af Amer: >60 mL/min (ref >60)
Glucose, Bld: 110 mg/dL — ABNORMAL HIGH (ref 70–99)
Potassium: 4.3 mmol/L (ref 3.5–5.1)
Sodium: 140 mmol/L (ref 135–145)

## 2019-05-05 LAB — MAGNESIUM: Magnesium: 2.1 mg/dL (ref 1.7–2.4)

## 2019-05-05 LAB — PROTIME-INR
INR: 1.8 — ABNORMAL HIGH (ref 0.8–1.2)
INR: 1.7 — ABNORMAL HIGH (ref 0.8–1.2)
Prothrombin Time: 20.3 seconds — ABNORMAL HIGH (ref 11.4–15.2)
Prothrombin Time: 19.6 seconds — ABNORMAL HIGH (ref 11.4–15.2)

## 2019-05-05 MED ORDER — WARFARIN SODIUM 10 MG PO TABS
10.0000 mg | ORAL_TABLET | Freq: Once | ORAL | Status: AC
  Administered 2019-05-05: 10 mg via ORAL
  Filled 2019-05-05: qty 1

## 2019-05-05 NOTE — Progress Notes (Signed)
[place] for Coumadin Indication: atrial fibrillation  Allergies  Allergen Reactions  . Azithromycin Nausea Only  . Latex Rash    rash  . Spinach Hives    Patient Measurements:   Vital Signs: Temp: 98.3 F (36.8 C) (09/13 0258) Temp Source: Oral (09/13 0258) BP: 145/84 (09/13 0258) Pulse Rate: 97 (09/13 0258)  Labs: Recent Labs    05/03/19 0324 05/04/19 0437 05/05/19 0400  LABPROT 20.5* 20.3* 20.3*  INR 1.8* 1.8* 1.8*  CREATININE 0.54 0.72  --     Estimated Creatinine Clearance: 95.1 mL/min (by C-G formula based on SCr of 0.72 mg/dL).   Medical History: Past Medical History:  Diagnosis Date  . Abdominal pain    ? chronic cholecystitis; admxn to [facility] 9/12 (CT, USN, HIDA done)  . Atrial fibrillation ([place])   . Cardiomyopathy    echo 9/12: mild LVH, EF 35-40%, mod MR (difficult to judge /post leaflet restricted), mod BAE, mod RVE, mod TR   . Cardiomyopathy ([place])    a. echo 9/12: mild LVH, EF 35-40%, mod MR (difficult to judge /post leaflet restricted), mod BAE, mod RVE, mod TR. b. Echo 11/2011: mild LVH, EF 55-60%, s/p MV repair w/o sig MR/MS, mildly dilated RV/RA, PASP 22mmHg. // AFL dx 10/2018 - Echo 10/2018: EF 30, diff HK, s/p MV repair with trivial MR, mean MV 7 but not c/w significant mitral stenosis, mild RAE, severe LAE, mod reduced RVSF   . Carotid artery disease ([place])    a. Duplex 06/2015: 50% RECA, 1-39% BICA.  [name] Coronary artery disease    a. s/p CABG 10/2011 - LIMA-LAD, SVG-PDA, SVG-OM, SVG-diagonal.  . Dyslipidemia   . Essential hypertension   . Hemifacial spasm   . History of Doppler ultrasound    a. Carotid US [facility] in [place], [name]) 7/17: no hemodynamically significant ICA stenosis  . Mitral regurgitation    a. s/p MV repair 10/2011 - on Coumadin for 3 months afterwards then d/c'd by surgery.  . Recurrent upper respiratory infection (URI)   . 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 Stroke [facility])    a. age 89 - in setting of cigs and OCPs. //  b. 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 Tobacco abuse     Medications:  Facility-Administered Medications Prior to Admission  Medication Dose Route Frequency Provider Last Rate Last Dose  . incobotulinumtoxinA (XEOMIN) 50 units injection 50 Units  50 Units Intramuscular Q90 days Marcial Pacas, MD   50 Units at 11/30/16 1546  . incobotulinumtoxinA (XEOMIN) 50 units injection 50 Units  50 Units Intramuscular Q90 days Marcial Pacas, MD   50 Units at 07/25/17 [phone]  . incobotulinumtoxinA (XEOMIN) 50 units injection 50 Units  50 Units Intramuscular Q90 days Marcial Pacas, MD   50 Units at 07/26/17 1217   Medications Prior to Admission  Medication Sig Dispense Refill Last Dose  . atorvastatin (LIPITOR) 80 MG tablet Take 1 tablet (80 mg total) by mouth daily at 6 PM. 90 tablet 3 04/29/2019 at Unknown time  . carvedilol (COREG) 25 MG tablet Take 1.5 tablets (37.5 mg total) by mouth 2 (two) times daily. 270 tablet 3 04/30/2019 at 1000  . diltiazem (CARDIZEM SR) 120 MG 12 hr capsule Take 1 capsule (120 mg total) by mouth 2 (two) times daily. 180 capsule 3 04/30/2019 at Unknown time  . famotidine (PEPCID)  20 MG tablet Take 20 mg by mouth daily as needed. For indigestion   Past Week at Unknown time  . furosemide (LASIX) 20 MG tablet TAKE 1 TABLET BY MOUTH AS NEEDED FOR  SWELLING (Patient taking differently: Take 20 mg by mouth daily as needed for fluid or edema. ) 90 tablet 2 Past Week at Unknown time  . vitamin B-12 (CYANOCOBALAMIN) 1000 MCG tablet Take 1 tablet (1,000 mcg total) by mouth daily.   04/30/2019 at Unknown time  . vitamin C (ASCORBIC ACID) 500 MG tablet Take 500 mg by mouth 2 (two) times daily.   Past Week at Unknown time  . warfarin (COUMADIN) 5 MG tablet Take 1 tablet (5 mg total) by mouth daily. 30 tablet 1 04/30/2019 at 1000    Assessment: 53 yo female here for Tikosyn initiation. Pharmacy consulted to  dose warfarin.  Held one dose of warfarin day of admission due to INR of 3.9; may have been erroneous.  - INR = 2.8 > 2.0 > 1.8 > 1.8 >1.8  Home dose: 5mg /day except 2.5mg  on SuWeFr (last visit 9/8)  INR slow to increase despite doses of Coumadin larger than her home dose.  No interacting medications noted.  All doses charted as given other than dose intentionally held 9/8.  Goal of Therapy:  INR 2-3 Monitor platelets by anticoagulation protocol: Yes   Plan:  - Warfarin 7.5 mg po again today - Daily PT/INR  Marguerite Olea, [facility] Clinical Pharmacist Phone [phone]  05/05/2019 7:18 AM

## 2019-05-05 NOTE — Progress Notes (Signed)
Progress Note   Subjective   Doing well today, the patient denies CP or SOB.  She had post termination pauses yesterday, without symptoms.  Now back in atrial arrhythmia.   No new concerns  Inpatient Medications    Scheduled Meds: . atorvastatin  80 mg Oral q1800  . carvedilol  37.5 mg Oral BID  . diltiazem  120 mg Oral Daily  . losartan  25 mg Oral Daily  . sodium chloride flush  3 mL Intravenous Q12H  . vitamin B-12  1,000 mcg Oral Daily  . vitamin C  500 mg Oral BID  . warfarin  10 mg Oral ONCE-1800  . Warfarin - Pharmacist Dosing Inpatient   Does not apply q1800   Continuous Infusions: . sodium chloride     PRN Meds: sodium chloride, acetaminophen, famotidine, furosemide, sodium chloride flush   Vital Signs    Vitals:   05/04/19 1701 05/04/19 2059 05/05/19 0258 05/05/19 1019  BP: (!) 143/95 (!) 153/93 (!) 145/84 (!) 166/100  Pulse:  94 97   Resp:  20 20   Temp:  98.9 F (37.2 C) 98.3 F (36.8 C)   TempSrc:  Oral Oral   SpO2:  94% 96%   Weight:   91.4 kg     Intake/Output Summary (Last 24 hours) at 05/05/2019 1159 Last data filed at 05/05/2019 0853 Gross per 24 hour  Intake 600 ml  Output -  Net 600 ml   Filed Weights   05/01/19 0500 05/04/19 0444 05/05/19 0258  Weight: 94.4 kg 94.5 kg 91.4 kg    Telemetry    Post termination pauses yesterday noted.  Today, back in a regular rhythm, likely atach vs atypical atrial flutter - Personally Reviewed  Physical Exam   GEN- The patient is overweight appearing, alert and oriented x 3 today.   Head- normocephalic, atraumatic Eyes-  blepherospasm noted R eye Ears- hearing intact Oropharynx- clear Neck- supple, Lungs-  normal work of breathing Heart- tachycardic irregular rhythm  GI- soft, NT, ND, + BS Extremities- no clubbing, cyanosis, or edema  MS- no significant deformity or atrophy Skin- no rash or lesion Psych- euthymic mood, full affect Neuro- strength and sensation are intact   Labs     Chemistry Recent Labs  Lab 05/03/19 0324 05/04/19 0437 05/05/19 0802  NA 138 140 140  K 4.3 4.1 4.3  CL 108 108 108  CO2 24 23 26   GLUCOSE 122* 107* 110*  BUN 11 8 9   CREATININE 0.54 0.72 0.70  CALCIUM 8.8* 9.1 9.4  GFRNONAA >60 >60 >60  GFRAA >60 >60 >60  ANIONGAP 6 9 6        Assessment & Plan    1.  Persistent afib/ atypical atrial flutter Unfortunately, with tikosyn reduction due to prolonged QT, she has returned to afib.  She had several post termination pauses noted on telemetry. I have stopped her tikosyn (last dose yesterday am). Continue rate control for now. She has inquired about ablation.  Unfortunately I am told that she does not have insurance.  In addition, she has substantial atriopathy s/p prior valve repair which does reduce my enthusiasm for ablation. Options of rate control vs rhythm control were discussed at length today.  She wishes to think about this further. If she decides to pursue rhythm control, amiodarone could be considered and depending on results, ablation may eventually be an option.  I think it would be ok to start amiodarone tomorrow am which would be 48 hours since  tikosyn.  Unfortunately, her INRs have been labile here.  I also worry that this may worsen with addition of amiodarone.  Xarelto would be a better option for her.  I will ask case management to see if she has an alternative (affordable) to coumadin.  2. Sinus bradycardia Will need to continue to monitor May ultimately require pacing if we decide to pursue sinus rhythm long term.  3. Obesity Lifestyle modification is encouraged Body mass index is 30.64 kg/m.  4. Chronic systolic dysfunction Continue current medical therapy  EP to follow up tomorrow Hopefully to discharge tomorrow with close follow-up with Dr Caryl Comes and the AF clinic  [name] MD, [facility] 05/05/2019 11:59 AM

## 2019-05-06 LAB — BASIC METABOLIC PANEL
Anion gap: 8 (ref 5–15)
BUN: 10 mg/dL (ref 6–20)
CO2: 23 mmol/L (ref 22–32)
Calcium: 8.9 mg/dL (ref 8.9–10.3)
Chloride: 108 mmol/L (ref 98–111)
Creatinine, Ser: 0.69 mg/dL (ref 0.44–1.00)
GFR calc Af Amer: >60 mL/min (ref >60)
GFR calc non Af Amer: >60 mL/min (ref >60)
Glucose, Bld: 109 mg/dL — ABNORMAL HIGH (ref 70–99)
Potassium: 4 mmol/L (ref 3.5–5.1)
Sodium: 139 mmol/L (ref 135–145)

## 2019-05-06 LAB — PROTIME-INR
INR: 1.6 — ABNORMAL HIGH (ref 0.8–1.2)
Prothrombin Time: 19.1 seconds — ABNORMAL HIGH (ref 11.4–15.2)

## 2019-05-06 MED ORDER — WARFARIN SODIUM 5 MG PO TABS: 10.0000 mg | ORAL_TABLET | Freq: Every day | ORAL | 1 refills | Status: DC

## 2019-05-06 MED ORDER — AMIODARONE HCL 200 MG PO TABS: 200.0000 mg | ORAL_TABLET | Freq: Two times a day (BID) | ORAL | 3 refills | Status: DC

## 2019-05-06 MED ORDER — WARFARIN SODIUM 10 MG PO TABS: 10.0000 mg | ORAL_TABLET | Freq: Once | ORAL | Status: DC

## 2019-05-06 MED ORDER — ENOXAPARIN SODIUM 150 MG/ML ~~LOC~~ SOLN
150.0000 mg | SUBCUTANEOUS | Status: DC
  Administered 2019-05-06: 11:00:00 150 mg via SUBCUTANEOUS
  Filled 2019-05-06: qty 1

## 2019-05-06 MED ORDER — ENOXAPARIN SODIUM 150 MG/ML ~~LOC~~ SOLN: 150.0000 mg | SUBCUTANEOUS | 0 refills | Status: DC

## 2019-05-06 MED ORDER — AMIODARONE HCL 200 MG PO TABS
200.0000 mg | ORAL_TABLET | Freq: Two times a day (BID) | ORAL | Status: DC
  Administered 2019-05-06: 11:00:00 200 mg via ORAL
  Filled 2019-05-06: qty 1

## 2019-05-06 MED ORDER — DILTIAZEM HCL ER COATED BEADS 120 MG PO CP24: 120.0000 mg | ORAL_CAPSULE | Freq: Every day | ORAL | 6 refills | Status: DC

## 2019-05-06 MED FILL — ENOXAPARIN SODIUM 150 MG/ML: 150 | 2 days supply | Qty: 2 | Fill #0

## 2019-05-06 MED FILL — AMIODARONE HCL 200 MG TAB: 200 | 30 days supply | Qty: 60 | Fill #0

## 2019-05-06 MED FILL — CARTIA XT 120 MG CP24: 120 | 30 days supply | Qty: 30 | Fill #0

## 2019-05-06 NOTE — Progress Notes (Signed)
Lovenox ordered on discharge. Patient states she is familiar with giving herself injections. Lovenox education completed with patient at the bedside. Pt instructed on how to administer self injections. Pt able to return demonstrate how to give injection. Patient administered first dose of lovenox to herself without issue. Lovenox education attached to patient's AVS.

## 2019-05-06 NOTE — Discharge Summary (Addendum)
ELECTROPHYSIOLOGY PROCEDURE DISCHARGE SUMMARY    Patient ID: Ashley [name],  MRN: [id number], DOB/AGE: [birth year] 53 y.o.  Admit date: 04/30/2019 Discharge date: 05/06/2019  Primary Care Physician: Patient, No Pcp Per  Primary Cardiologist: Dr. Harrington Challenger Electrophysiologist: Dr. Caryl Comes  Primary Discharge Diagnosis:  1.  Persistent atrial fibrillation/Atrial Flutter status post Tikosyn loading this admission 2. Prolonged QT on tikosyn  Secondary Discharge Diagnosis:  HTN  Allergies  Allergen Reactions   Azithromycin Nausea Only   Latex Rash    rash   Spinach Hives     Procedures This Admission:  1.  Tikosyn loading (Failed due to prolonged QTc)  Brief HPI: Ashley [name] is a 53 y.o. female with a past medical history as noted above.  They were referred to EP in the outpatient setting for treatment options of atrial fibrillation.  Risks, benefits, and alternatives to Tikosyn were reviewed with the patient who wished to proceed.    Hospital Course:  The patient was admitted and Tikosyn was initiated.  Renal function and electrolytes were followed during the hospitalization. She initially converted to NSR on Tikosyn. Her QTc lengthened, prompting decrease of Tikosyn, which led to recurrent Afib/Flutter. Ablation discussed, but with substantial atriopathy s/p prior valve repair and no insurance, she is thought to be a poor candidate.  Decision made to start on amiodarone and continue to follow closely as outpatient for further consideration of rhythm control.  Amiodarone started 05/06/19, 48 hours after stopping tikosyn. Pt monitored on telemetry which continued to show atrial flutter with rates ~ 100 up to discharge. On the day of discharge, they were examined by Dr Lovena Le who considered them stable for discharge to home with follow up as below. Follow-up has been arranged with Afib clinic in 1 week and with Dr Caryl Comes in 4 weeks. INR labile during admit. Has close  coumadin clinic follow up 9/16, with lovenox bridge. Without insurance, at this time it would be difficult for her to afford Xarelto, but will work on seeing if she qualifies for assistance. (She made $2k too much annually to qualify for Eliquis assistance)   Physical Exam: Vitals:   05/05/19 1954 05/05/19 2108 05/06/19 0601 05/06/19 0816  BP: (!) 171/100 (!) 158/90 (!) 151/91   Pulse:      Resp: 18  20   Temp: 98.3 F (36.8 C)  98.3 F (36.8 C)   TempSrc: Oral  Oral   SpO2: 96%  96%   Weight:   90.4 kg   Height:    5\' 8"  (1.727 m)    GEN- The patient is well appearing, alert and oriented x 3 today.   HEENT: normocephalic, atraumatic; sclera clear, conjunctiva pink; hearing intact; oropharynx clear; neck supple, no JVP Lymph- no cervical lymphadenopathy Lungs- Clear to ausculation bilaterally, normal work of breathing.  No wheezes, rales, rhonchi Heart- Regular rate and rhythm (underlying Aflutter), no murmurs, rubs or gallops, PMI not laterally displaced GI- soft, non-tender, non-distended, bowel sounds present, no hepatosplenomegaly Extremities- no clubbing, cyanosis, or edema; DP/PT/radial pulses 2+ bilaterally MS- no significant deformity or atrophy Skin- warm and dry, no rash or lesion Psych- euthymic mood, full affect Neuro- strength and sensation are intact   Labs:   Lab Results  Component Value Date   WBC 5.6 01/21/2019   HGB 11.7 01/21/2019   HCT 35.0 01/21/2019   MCV 96 01/21/2019   PLT 187 01/21/2019    Recent Labs  Lab 05/06/19 0625  NA 139  K  4.0  CL 108  CO2 23  BUN 10  CREATININE 0.69  CALCIUM 8.9  GLUCOSE 109*     Discharge Medications:  Allergies as of 05/06/2019      Reactions   Azithromycin Nausea Only   Latex Rash   rash   Spinach Hives      Medication List    STOP taking these medications   diltiazem 120 MG 12 hr capsule Commonly known as: CARDIZEM SR     TAKE these medications   amiodarone 200 MG tablet Commonly known as:  PACERONE Take 1 tablet (200 mg total) by mouth 2 (two) times daily.   atorvastatin 80 MG tablet Commonly known as: LIPITOR Take 1 tablet (80 mg total) by mouth daily at 6 PM.   carvedilol 25 MG tablet Commonly known as: COREG Take 1.5 tablets (37.5 mg total) by mouth 2 (two) times daily.   diltiazem 120 MG 24 hr capsule Commonly known as: CARDIZEM CD Take 1 capsule (120 mg total) by mouth daily. Start taking on: May 07, 2019   enoxaparin 150 MG/ML injection Commonly known as: Lovenox Inject 1 mL (150 mg total) into the skin daily.   famotidine 20 MG tablet Commonly known as: PEPCID Take 20 mg by mouth daily as needed. For indigestion   furosemide 20 MG tablet Commonly known as: LASIX TAKE 1 TABLET BY MOUTH AS NEEDED FOR  SWELLING What changed: See the new instructions.   losartan 25 MG tablet Commonly known as: COZAAR Take 1 tablet (25 mg total) by mouth daily.   vitamin B-12 1000 MCG tablet Commonly known as: CYANOCOBALAMIN Take 1 tablet (1,000 mcg total) by mouth daily.   vitamin C 500 MG tablet Commonly known as: ASCORBIC ACID Take 500 mg by mouth 2 (two) times daily.   warfarin 5 MG tablet Commonly known as: COUMADIN Take as directed. If you are unsure how to take this medication, talk to your nurse or doctor. Original instructions: Take 2 tablets (10 mg total) by mouth daily. Take 10 mg evening of 9/14 and 9/15. Further per coumadin clinic What changed:   how much to take  additional instructions       Disposition:  Discharge Instructions    Diet - low sodium heart healthy   Complete by: As directed    Increase activity slowly   Complete by: As directed      Follow-up Information    [name], MD Follow up on 05/13/2019.   Specialty: Family Medicine Why: @ 2:30 PM (Arrive by 2:15 PM)-Please arrive 15 minutes prior to your appointment. This will allow [place] to verify and update your medical record and ensure a full appointment for you  within the time allotted. Contact information: [place] [address] [phone]        [place] [facility] Follow up on 05/08/2019.   Specialty: Cardiology Why: 11:00 AM  Contact information: [address], [address] [place]       [name], MD Follow up on 06/05/2019.   Specialty: Cardiology Why: At 1100 am for  Contact information: [address] [address] [phone]        [place] Follow up on 05/15/2019.   Specialty: Cardiology Why: at 130 pm for post tikosyn trial follow up Contact information: [place] [id number] [name] [phone]  Duration of Discharge Encounter: Greater than 30 minutes including physician time.  Jacalyn Lefevre, PA-C  05/06/2019 11:22 AM  EP Attending  Patient seen and examined. Agree with above. The patient is stable for DC. She is in atrial tachycardia and her rate is controlled. She has been started on amiodarone. She will be considered for DCCV in the future.   Mikle Bosworth.D.

## 2019-05-06 NOTE — Progress Notes (Signed)
[place] for Coumadin Indication: atrial fibrillation  Allergies  Allergen Reactions  . Azithromycin Nausea Only  . Latex Rash    rash  . Spinach Hives    Patient Measurements:   Vital Signs: Temp: 98.3 F (36.8 C) (09/14 0601) Temp Source: Oral (09/14 0601) BP: 151/91 (09/14 0601)  Labs: Recent Labs    05/04/19 0437 05/05/19 0400 05/05/19 0802 05/06/19 0625  LABPROT 20.3* 20.3* 19.6* 19.1*  INR 1.8* 1.8* 1.7* 1.6*  CREATININE 0.72  --  0.70 0.69    Estimated Creatinine Clearance: 94.5 mL/min (by C-G formula based on SCr of 0.69 mg/dL).   Medical History: Past Medical History:  Diagnosis Date  . Abdominal pain    ? chronic cholecystitis; adm[facility]n to [facility] 9/12 (CT, USN, HIDA done)  . Atrial fibrillation ([place])   . Cardiomyopathy    echo 9/12: mild LVH, EF 35-40%, mod MR (difficult to judge /post leaflet restricted), mod BAE, mod RVE, mod TR   . Cardiomyopathy ([place])    a. echo 9/12: mild LVH, EF 35-40%, mod MR (difficult to judge /post leaflet restricted), mod BAE, mod RVE, mod TR. b. Echo 11/2011: mild LVH, EF 55-60%, s/p MV repair w/o sig MR/MS, mildly dilated RV/RA, PASP 66mmHg. // AFL dx 10/2018 - Echo 10/2018: EF 30, diff HK, s/p MV repair with trivial MR, mean MV 7 but not c/w significant mitral stenosis, mild RAE, severe LAE, mod reduced RVSF   . Carotid artery disease ([place])    a. Duplex 06/2015: 50% RECA, 1-39% BICA.  [name] Coronary artery disease    a. s/p CABG 10/2011 - LIMA-LAD, SVG-PDA, SVG-OM, SVG-diagonal.  . Dyslipidemia   . Essential hypertension   . Hemifacial spasm   . History of Doppler ultrasound    a. Carotid US [facility] in [place], [name]) 7/17: no hemodynamically significant ICA stenosis  . Mitral regurgitation    a. s/p MV repair 10/2011 - on Coumadin for 3 months afterwards then d/c'd by surgery.  . Recurrent upper respiratory infection (URI)   . 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 Stroke [facility])    a. age 15 - in setting of cigs and OCPs. //  b. 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 Tobacco abuse     Medications:  Facility-Administered Medications Prior to Admission  Medication Dose Route Frequency Provider Last Rate Last Dose  . incobotulinumtoxinA (XEOMIN) 50 units injection 50 Units  50 Units Intramuscular Q90 days Marcial Pacas, MD   50 Units at 11/30/16 1546  . incobotulinumtoxinA (XEOMIN) 50 units injection 50 Units  50 Units Intramuscular Q90 days Marcial Pacas, MD   50 Units at 07/25/17 [phone]  . incobotulinumtoxinA (XEOMIN) 50 units injection 50 Units  50 Units Intramuscular Q90 days Marcial Pacas, MD   50 Units at 07/26/17 1217   Medications Prior to Admission  Medication Sig Dispense Refill Last Dose  . atorvastatin (LIPITOR) 80 MG tablet Take 1 tablet (80 mg total) by mouth daily at 6 PM. 90 tablet 3 04/29/2019 at Unknown time  . carvedilol (COREG) 25 MG tablet Take 1.5 tablets (37.5 mg total) by mouth 2 (two) times daily. 270 tablet 3 04/30/2019 at 1000  . diltiazem (CARDIZEM SR) 120 MG 12 hr capsule Take 1 capsule (120 mg total) by mouth 2 (two) times daily. 180 capsule 3 04/30/2019 at Unknown time  . famotidine (PEPCID)  20 MG tablet Take 20 mg by mouth daily as needed. For indigestion   Past Week at Unknown time  . furosemide (LASIX) 20 MG tablet TAKE 1 TABLET BY MOUTH AS NEEDED FOR  SWELLING (Patient taking differently: Take 20 mg by mouth daily as needed for fluid or edema. ) 90 tablet 2 Past Week at Unknown time  . vitamin B-12 (CYANOCOBALAMIN) 1000 MCG tablet Take 1 tablet (1,000 mcg total) by mouth daily.   04/30/2019 at Unknown time  . vitamin C (ASCORBIC ACID) 500 MG tablet Take 500 mg by mouth 2 (two) times daily.   Past Week at Unknown time  . warfarin (COUMADIN) 5 MG tablet Take 1 tablet (5 mg total) by mouth daily. 30 tablet 1 04/30/2019 at 1000    Assessment: 53 yo female here for Tikosyn initiation. Pharmacy consulted to  dose warfarin.  Held one dose of warfarin day of admission due to INR of 3.9; may have been erroneous.  - INR = 2.8 > 2.0 > 1.8 > 1.8 >1.8 (1.7) > 1.6  Home dose: 5mg /day except 2.5mg  on SuWeFr (last visit 9/8)  INR slow to increase despite doses of Coumadin larger than her home dose.  No interacting medications noted.  All doses charted as given other than dose intentionally held 9/8.  I spoke to patient extensively today, she says her diet in hospital has been the same here as at home, except at home she has a "glass of wine or two" each evening that she hasn't had here.  She has applied for the Eliquis patient assistance program in the past, but her income level was just slightly above the cut off to receive aid.  I have sent a message to the [facility]mad[facility] clinic to ask them to help her try to apply for Xarelto assistance program.  Goal of Therapy:  INR 2-3 Monitor platelets by anticoagulation protocol: Yes   Plan:  - Warfarin 10 mg po again today - Daily PT/INR -Will add Lovenox ~1.5 mg/kg once daily as bridge until INR comes up (discusssed with EP - Oda Kilts, PA)  [place] Crane, Roylene Reason, [place] Pharmacist Phone [phone]  05/06/2019 9:52 AM

## 2019-05-06 NOTE — Addendum Note (Signed)
Encounter addended by: Shirley Friar, PA-C on: 05/06/2019 7:43 AM  Actions taken: Delete clinical note

## 2019-05-06 NOTE — Discharge Instructions (Signed)
Enoxaparin injection °What is this medicine? °ENOXAPARIN (ee nox a PA rin) is used after knee, hip, or abdominal surgeries to prevent blood clotting. It is also used to treat existing blood clots in the lungs or in the veins. °This medicine may be used for other purposes; ask your health care provider or pharmacist if you have questions. °COMMON BRAND NAME(S): Lovenox °What should I tell my health care provider before I take this medicine? °They need to know if you have any of these conditions: °· bleeding disorders, hemorrhage, or hemophilia °· infection of the heart or heart valves °· kidney or liver disease °· previous stroke °· prosthetic heart valve °· recent surgery or delivery of a baby °· ulcer in the stomach or intestine, diverticulitis, or other bowel disease °· an unusual or allergic reaction to enoxaparin, heparin, pork or pork products, other medicines, foods, dyes, or preservatives °· pregnant or trying to get pregnant °· breast-feeding °How should I use this medicine? °This medicine is for injection under the skin. It is usually given by a health-care professional. You or a family member may be trained on how to give the injections. If you are to give yourself injections, make sure you understand how to use the syringe, measure the dose if necessary, and give the injection. To avoid bruising, do not rub the site where this medicine has been injected. Do not take your medicine more often than directed. Do not stop taking except on the advice of your doctor or health care professional. °Make sure you receive a puncture-resistant container to dispose of the needles and syringes once you have finished with them. Do not reuse these items. Return the container to your doctor or health care professional for proper disposal. °Talk to your pediatrician regarding the use of this medicine in children. Special care may be needed. °Overdosage: If you think you have taken too much of this medicine contact a poison  control center or emergency room at once. °NOTE: This medicine is only for you. Do not share this medicine with others. °What if I miss a dose? °If you miss a dose, take it as soon as you can. If it is almost time for your next dose, take only that dose. Do not take double or extra doses. °What may interact with this medicine? °· aspirin and aspirin-like medicines °· certain medicines that treat or prevent blood clots °· dipyridamole °· NSAIDs, medicines for pain and inflammation, like ibuprofen or naproxen °This list may not describe all possible interactions. Give your health care provider a list of all the medicines, herbs, non-prescription drugs, or dietary supplements you use. Also tell them if you smoke, drink alcohol, or use illegal drugs. Some items may interact with your medicine. °What should I watch for while using this medicine? °Visit your healthcare professional for regular checks on your progress. You may need blood work done while you are taking this medicine. Your condition will be monitored carefully while you are receiving this medicine. It is important not to miss any appointments. °If you are going to need surgery or other procedure, tell your healthcare professional that you are using this medicine. °Using this medicine for a long time may weaken your bones and increase the risk of bone fractures. °Avoid sports and activities that might cause injury while you are using this medicine. Severe falls or injuries can cause unseen bleeding. Be careful when using sharp tools or knives. Consider using an electric razor. Take special care brushing or flossing your   teeth. Report any injuries, bruising, or red spots on the skin to your healthcare professional. Wear a medical ID bracelet or chain. Carry a card that describes your disease and details of your medicine and dosage times. What side effects may I notice from receiving this medicine? Side effects that you should report to your doctor or health  care professional as soon as possible:  allergic reactions like skin rash, itching or hives, swelling of the face, lips, or tongue  bone pain  signs and symptoms of bleeding such as bloody or black, tarry stools; red or dark-brown urine; spitting up blood or brown material that looks like coffee grounds; red spots on the skin; unusual bruising or bleeding from the eye, gums, or nose  signs and symptoms of a blood clot such as chest pain; shortness of breath; pain, swelling, or warmth in the leg  signs and symptoms of a stroke such as changes in vision; confusion; trouble speaking or understanding; severe headaches; sudden numbness or weakness of the face, arm or leg; trouble walking; dizziness; loss of coordination Side effects that usually do not require medical attention (report to your doctor or health care professional if they continue or are bothersome):  hair loss  pain, redness, or irritation at site where injected This list may not describe all possible side effects. Call your doctor for medical advice about side effects. You may report side effects to FDA at 1-800-FDA-1088. Where should I keep my medicine? Keep out of the reach of children. Store at room temperature between 15 and 30 degrees C (59 and 86 degrees F). Do not freeze. If your injections have been specially prepared, you may need to store them in the refrigerator. Ask your pharmacist. Throw away any unused medicine after the expiration date. NOTE: This sheet is a summary. It may not cover all possible information. If you have questions about this medicine, talk to your doctor, pharmacist, or health care provider.  2020 Elsevier/Gold Standard (2017-08-03 11:25:34)    Amiodarone tablets What is this medicine? AMIODARONE (a MEE oh da rone) is an antiarrhythmic drug. It helps make your heart beat regularly. Because of the side effects caused by this medicine, it is only used when other medicines have not worked. It is  usually used for heartbeat problems that may be life threatening. This medicine may be used for other purposes; ask your health care provider or pharmacist if you have questions. COMMON BRAND NAME(S): Cordarone, Pacerone What should I tell my health care provider before I take this medicine? They need to know if you have any of these conditions:  liver disease  lung disease  other heart problems  thyroid disease  an unusual or allergic reaction to amiodarone, iodine, other medicines, foods, dyes, or preservatives  pregnant or trying to get pregnant  breast-feeding How should I use this medicine? Take this medicine by mouth with a glass of water. Follow the directions on the prescription label. You can take this medicine with or without food. However, you should always take it the same way each time. Take your doses at regular intervals. Do not take your medicine more often than directed. Do not stop taking except on the advice of your doctor or health care professional. A special MedGuide will be given to you by the pharmacist with each prescription and refill. Be sure to read this information carefully each time. Talk to your pediatrician regarding the use of this medicine in children. Special care may be needed. Overdosage: If  you think you have taken too much of this medicine contact a poison control center or emergency room at once. NOTE: This medicine is only for you. Do not share this medicine with others. What if I miss a dose? If you miss a dose, take it as soon as you can. If it is almost time for your next dose, take only that dose. Do not take double or extra doses. What may interact with this medicine? Do not take this medicine with any of the following medications:  abarelix  apomorphine  arsenic trioxide  certain antibiotics like erythromycin, gemifloxacin, levofloxacin, pentamidine  certain medicines for depression like amoxapine, tricyclic  antidepressants  certain medicines for fungal infections like fluconazole, itraconazole, ketoconazole, posaconazole, voriconazole  certain medicines for irregular heart beat like disopyramide, dronedarone, ibutilide, propafenone, sotalol  certain medicines for malaria like chloroquine, halofantrine  cisapride  droperidol  haloperidol  haw[place]  maprotiline  methadone  phenothiazines like chlorpromazine, mesoridazine, thioridazine  pimozide  ranolazine  red yeast rice  vardenafil This medicine may also interact with the following medications:  antiviral medicines for HIV or AIDS  certain medicines for blood pressure, heart disease, irregular heart beat  certain medicines for cholesterol like atorvastatin, cerivastatin, lovastatin, simvastatin  certain medicines for hepatitis C like sofosbuvir and ledipasvir; sofosbuvir  certain medicines for seizures like phenytoin  certain medicines for thyroid problems  certain medicines that treat or prevent blood clots like warfarin  cholestyramine  cimetidine  clopidogrel  cyclosporine  dextromethorphan  diuretics  dofetilide  fentanyl  general anesthetics  grapefruit juice  lidocaine  loratadine  methotrexate  other medicines that prolong the QT interval (cause an abnormal heart rhythm)  procainamide  quinidine  rifabutin, rifampin, or rifapentine  St. John's Wort  trazodone  ziprasidone This list may not describe all possible interactions. Give your health care provider a list of all the medicines, herbs, non-prescription drugs, or dietary supplements you use. Also tell them if you smoke, drink alcohol, or use illegal drugs. Some items may interact with your medicine. What should I watch for while using this medicine? Your condition will be monitored closely when you first begin therapy. Often, this drug is first started in a hospital or other monitored health care setting. Once you are on  maintenance therapy, visit your doctor or health care professional for regular checks on your progress. Because your condition and use of this medicine carry some risk, it is a good idea to carry an identification card, necklace or bracelet with details of your condition, medications, and doctor or health care professional. Dennis Bast may get drowsy or dizzy. Do not drive, use machinery, or do anything that needs mental alertness until you know how this medicine affects you. Do not stand or sit up quickly, especially if you are an older patient. This reduces the risk of dizzy or fainting spells. This medicine can make you more sensitive to the sun. Keep out of the sun. If you cannot avoid being in the sun, wear protective clothing and use sunscreen. Do not use sun lamps or tanning beds/booths. You should have regular eye exams before and during treatment. Call your doctor if you have blurred vision, see halos, or your eyes become sensitive to light. Your eyes may get dry. It may be helpful to use a lubricating eye solution or artificial tears solution. If you are going to have surgery or a procedure that requires contrast dyes, tell your doctor or health care professional that you are taking this  medicine. What side effects may I notice from receiving this medicine? Side effects that you should report to your doctor or health care professional as soon as possible:  allergic reactions like skin rash, itching or hives, swelling of the face, lips, or tongue  blue-gray coloring of the skin  blurred vision, seeing blue green halos, increased sensitivity of the eyes to light  breathing problems  chest pain  dark urine  fast, irregular heartbeat  feeling faint or light-headed  intolerance to heat or cold  nausea or vomiting  pain and swelling of the scrotum  pain, tingling, numbness in feet, hands  redness, blistering, peeling or loosening of the skin, including inside the mouth  spitting up  blood  stomach pain  sweating  unusual or uncontrolled movements of body  unusually weak or tired  weight gain or loss  yellowing of the eyes or skin Side effects that usually do not require medical attention (report to your doctor or health care professional if they continue or are bothersome):  change in sex drive or performance  constipation  dizziness  headache  loss of appetite  trouble sleeping This list may not describe all possible side effects. Call your doctor for medical advice about side effects. You may report side effects to FDA at 1-800-FDA-1088. Where should I keep my medicine? Keep out of the reach of children. Store at room temperature between 20 and 25 degrees C (68 and 77 degrees F). Protect from light. Keep container tightly closed. Throw away any unused medicine after the expiration date. NOTE: This sheet is a summary. It may not cover all possible information. If you have questions about this medicine, talk to your doctor, pharmacist, or health care provider.  2020 Elsevier/Gold Standard (2018-07-11 13:44:04)   Heart-Healthy Eating Plan Heart-healthy meal planning includes:  Eating less unhealthy fats.  Eating more healthy fats.  Making other changes in your diet. Talk with your doctor or a diet specialist (dietitian) to create an eating plan that is right for you. What is my plan? Your doctor may recommend an eating plan that includes:  Total fat: ______% or less of total calories a day.  Saturated fat: ______% or less of total calories a day.  Cholesterol: less than _________mg a day. What are tips for following this plan? Cooking Avoid frying your food. Try to bake, boil, grill, or broil it instead. You can also reduce fat by:  Removing the skin from poultry.  Removing all visible fats from meats.  Steaming vegetables in water or broth. Meal planning   At meals, divide your plate into four equal parts: ? Fill one-half of your  plate with vegetables and green salads. ? Fill one-fourth of your plate with whole grains. ? Fill one-fourth of your plate with lean protein foods.  Eat 4-5 servings of vegetables per day. A serving of vegetables is: ? 1 cup of raw or cooked vegetables. ? 2 cups of raw leafy greens.  Eat 4-5 servings of fruit per day. A serving of fruit is: ? 1 medium whole fruit. ?  cup of dried fruit. ?  cup of fresh, frozen, or canned fruit. ?  cup of 100% fruit juice.  Eat more foods that have soluble fiber. These are apples, broccoli, carrots, beans, peas, and barley. Try to get 20-30 g of fiber per day.  Eat 4-5 servings of nuts, legumes, and seeds per week: ? 1 serving of dried beans or legumes equals  cup after being cooked. ?  1 serving of nuts is  cup. ? 1 serving of seeds equals 1 tablespoon. General information  Eat more home-cooked food. Eat less restaurant, buffet, and fast food.  Limit or avoid alcohol.  Limit foods that are high in starch and sugar.  Avoid fried foods.  Lose weight if you are overweight.  Keep track of how much salt (sodium) you eat. This is important if you have high blood pressure. Ask your doctor to tell you more about this.  Try to add vegetarian meals each week. Fats  Choose healthy fats. These include olive oil and canola oil, flaxseeds, walnuts, almonds, and seeds.  Eat more omega-3 fats. These include salmon, mackerel, sardines, tuna, flaxseed oil, and ground flaxseeds. Try to eat fish at least 2 times each week.  Check food labels. Avoid foods with trans fats or high amounts of saturated fat.  Limit saturated fats. ? These are often found in animal products, such as meats, butter, and cream. ? These are also found in plant foods, such as palm oil, palm kernel oil, and coconut oil.  Avoid foods with partially hydrogenated oils in them. These have trans fats. Examples are stick margarine, some tub margarines, cookies, crackers, and other baked  goods. What foods can I eat? Fruits All fresh, canned (in natural juice), or frozen fruits. Vegetables Fresh or frozen vegetables (raw, steamed, roasted, or grilled). Green salads. Grains Most grains. Choose whole wheat and whole grains most of the time. Rice and pasta, including brown rice and pastas made with whole wheat. Meats and other proteins Lean, well-trimmed beef, veal, pork, and lamb. Chicken and [place] without skin. All fish and shellfish. Wild duck, rabbit, pheasant, and venison. Egg whites or low-cholesterol egg substitutes. Dried beans, peas, lentils, and tofu. Seeds and most nuts. Dairy Low-fat or nonfat cheeses, including ricotta and mozzarella. Skim or 1% milk that is liquid, powdered, or evaporated. Buttermilk that is made with low-fat milk. Nonfat or low-fat yogurt. Fats and oils Non-hydrogenated (trans-free) margarines. Vegetable oils, including soybean, sesame, sunflower, olive, peanut, safflower, corn, canola, and cottonseed. Salad dressings or mayonnaise made with a vegetable oil. Beverages Mineral water. Coffee and tea. Diet carbonated beverages. Sweets and desserts Sherbet, gelatin, and fruit ice. Small amounts of dark chocolate. Limit all sweets and desserts. Seasonings and condiments All seasonings and condiments. The items listed above may not be a complete list of foods and drinks you can eat. Contact a dietitian for more options. What foods should I avoid? Fruits Canned fruit in heavy syrup. Fruit in cream or butter sauce. Fried fruit. Limit coconut. Vegetables Vegetables cooked in cheese, cream, or butter sauce. Fried vegetables. Grains Breads that are made with saturated or trans fats, oils, or whole milk. Croissants. Sweet rolls. Donuts. High-fat crackers, such as cheese crackers. Meats and other proteins Fatty meats, such as hot dogs, ribs, sausage, bacon, rib-eye roast or steak. High-fat deli meats, such as salami and bologna. Caviar. Domestic duck and  goose. Organ meats, such as liver. Dairy Cream, sour cream, cream cheese, and creamed cottage cheese. Whole-milk cheeses. Whole or 2% milk that is liquid, evaporated, or condensed. Whole buttermilk. Cream sauce or high-fat cheese sauce. Yogurt that is made from whole milk. Fats and oils Meat fat, or shortening. Cocoa butter, hydrogenated oils, palm oil, coconut oil, palm kernel oil. Solid fats and shortenings, including bacon fat, salt pork, lard, and butter. Nondairy cream substitutes. Salad dressings with cheese or sour cream. Beverages Regular sodas and juice drinks with added sugar. Sweets  and desserts Frosting. Pudding. Cookies. Cakes. Pies. Milk chocolate or white chocolate. Buttered syrups. Full-fat ice cream or ice cream drinks. The items listed above may not be a complete list of foods and drinks to avoid. Contact a dietitian for more information. Summary  Heart-healthy meal planning includes eating less unhealthy fats, eating more healthy fats, and making other changes in your diet.  Eat a balanced diet. This includes fruits and vegetables, low-fat or nonfat dairy, lean protein, nuts and legumes, whole grains, and heart-healthy oils and fats. This information is not intended to replace advice given to you by your health care provider. Make sure you discuss any questions you have with your health care provider. Document Released: 02/07/2012 Document Revised: 10/12/2017 Document Reviewed: 09/15/2017 Elsevier Patient Education  2020 [employer].

## 2019-05-08 ENCOUNTER — Ambulatory Visit (INDEPENDENT_AMBULATORY_CARE_PROVIDER_SITE_OTHER): Payer: Self-pay | Admitting: *Deleted

## 2019-05-08 ENCOUNTER — Other Ambulatory Visit: Payer: Self-pay

## 2019-05-08 DIAGNOSIS — Z8673 Personal history of transient ischemic attack (TIA), and cerebral infarction without residual deficits: Secondary | ICD-10-CM

## 2019-05-08 DIAGNOSIS — I4891 Unspecified atrial fibrillation: Secondary | ICD-10-CM

## 2019-05-08 LAB — POCT INR: INR: 3.1 — AB (ref 2.0–3.0)

## 2019-05-08 NOTE — Patient Instructions (Signed)
Description   Stop taking lovenox. Continue taking 1 tablet daily except for 1/2 a tablet on Sunday, Wednesday and Friday. Eat an extra serving of greens. Recheck INR in 1 week. Call [facility] [phone] if you have any changes in your medication or upcoming procedures. Pt stated she would try to bring Xarelto paperwork to help with financial assistance.

## 2019-05-13 ENCOUNTER — Other Ambulatory Visit: Payer: Self-pay

## 2019-05-13 ENCOUNTER — Ambulatory Visit: Payer: Self-pay | Attending: Family Medicine | Admitting: Family Medicine

## 2019-05-13 ENCOUNTER — Encounter: Payer: Self-pay | Admitting: Family Medicine

## 2019-05-13 VITALS — BP 158/95 | HR 93 | Temp 98.2°F | Ht 68.0 in | Wt 201.2 lb

## 2019-05-13 DIAGNOSIS — I4819 Other persistent atrial fibrillation: Secondary | ICD-10-CM

## 2019-05-13 DIAGNOSIS — I2510 Atherosclerotic heart disease of native coronary artery without angina pectoris: Secondary | ICD-10-CM

## 2019-05-13 DIAGNOSIS — I10 Essential (primary) hypertension: Secondary | ICD-10-CM

## 2019-05-13 NOTE — Progress Notes (Signed)
Subjective:  Patient ID: Ashley [name], female    DOB: 30-May-1966  Age: 53 y.o. MRN: [id number]  CC: Hospitalization Follow-up   HPI Ashley [name] is a 53 year old female with a history of hypertension, CAD status post CABG, history of mitral valve repair, atrial fibrillation here to establish care after hospitalization at [facility] from 04/30/2019 through 05/06/2019; she was referred to EP for Tikosyn treatment which failed due to prolonged QTC. During her hospital course she was commenced on amiodarone and continued on Coumadin with Lovenox bridge with outpatient follow-up set up with the Coumadin clinic and with the A. fib clinic.  She endorses improved energy, decreased dyspnea over the last couple of months.  Denies chest pain, pedal edema. Her blood pressure is elevated and she attributes this to whitecoat syndrome and also informs me she just received word that her sister is currently hospitalized in [place] and this has her worried.  Her blood pressure at home was 145/87.  Endorses compliance with her medications. INR from 05/08/2019 was 3.1 at the Coumadin clinic. She has no additional concerns today.  Past Medical History:  Diagnosis Date  . Abdominal pain    ? chronic cholecystitis; admxn to [facility] 9/12 (CT, USN, HIDA done)  . Atrial fibrillation ([place])   . Cardiomyopathy    echo 9/12: mild LVH, EF 35-40%, mod MR (difficult to judge /post leaflet restricted), mod BAE, mod RVE, mod TR   . Cardiomyopathy ([place])    a. echo 9/12: mild LVH, EF 35-40%, mod MR (difficult to judge /post leaflet restricted), mod BAE, mod RVE, mod TR. b. Echo 11/2011: mild LVH, EF 55-60%, s/p MV repair w/o sig MR/MS, mildly dilated RV/RA, PASP 58mmHg. // AFL dx 10/2018 - Echo 10/2018: EF 30, diff HK, s/p MV repair with trivial MR, mean MV 7 but not c/w significant mitral stenosis, mild RAE, severe LAE, mod reduced RVSF   . Carotid artery disease ([place])    a. Duplex 06/2015: 50% RECA, 1-39%  BICA.  [name] Coronary artery disease    a. s/p CABG 10/2011 - LIMA-LAD, SVG-PDA, SVG-OM, SVG-diagonal.  . Dyslipidemia   . Essential hypertension   . Hemifacial spasm   . History of Doppler ultrasound    a. Carotid US [facility] in [place], [name]) 7/17: no hemodynamically significant ICA stenosis  . Mitral regurgitation    a. s/p MV repair 10/2011 - on Coumadin for 3 months afterwards then d/c'd by surgery.  . Recurrent upper respiratory infection (URI)   . 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 Stroke [facility])    a. age 41 - in setting of cigs and OCPs. //  b. s/p acute L parietal CVA >> tPA - [facility] in [place], [name]  . Tobacco abuse     Past Surgical History:  Procedure Laterality Date  . CARDIAC CATHETERIZATION    . CARDIOVERSION N/A 01/25/2019   Procedure: CARDIOVERSION;  Surgeon: [name], MD;  Location: [facility] ENDOSCOPY;  Service: Cardiovascular;  Laterality: N/A;  . CESAREAN SECTION    . cesarean section    . CORONARY ARTERY BYPASS GRAFT  10/21/2011   Procedure: CORONARY ARTERY BYPASS GRAFTING (CABG);  Surgeon: [name], MD;  Location: [place];  Service: Open Heart Surgery;  Laterality: N/A;  cabg x four, using right leg greater saphenous vein harvested endoscopically  . DENTAL SURGERY    . MITRAL VALVE REPAIR  10/21/2011  Procedure: MITRAL VALVE REPAIR (MVR);  Surgeon: [name], MD;  Location: [place];  Service: Open Heart Surgery;  Laterality: N/A;  . TEE WITHOUT CARDIOVERSION  06/30/2011   Procedure: TRANSESOPHAGEAL ECHOCARDIOGRAM (TEE);  Surgeon: [name], MD;  Location[facility] ENDOSCOPY;  Service: Cardiovascular;  Laterality: N/A;  . TONSILLECTOMY    . TUBAL LIGATION      Family History  Problem Relation Age of Onset  . Hypertension Mother   . Stroke Mother   . Heart disease Father   . COPD Father   . Anesthesia problems Neg Hx   . Hypotension Neg Hx   . Malignant hyperthermia Neg Hx   . Pseudochol deficiency Neg Hx      Allergies  Allergen Reactions  . Azithromycin Nausea Only  . Latex Rash    rash  . Spinach Hives    Outpatient Medications Prior to Visit  Medication Sig Dispense Refill  . amiodarone (PACERONE) 200 MG tablet Take 1 tablet (200 mg total) by mouth 2 (two) times daily. 60 tablet 3  . atorvastatin (LIPITOR) 80 MG tablet Take 1 tablet (80 mg total) by mouth daily at 6 PM. 90 tablet 3  . diltiazem (CARDIZEM CD) 120 MG 24 hr capsule Take 1 capsule (120 mg total) by mouth daily. 30 capsule 6  . enoxaparin (LOVENOX) 150 MG/ML injection Inject 1 mL (150 mg total) into the skin daily. 2 mL 0  . famotidine (PEPCID) 20 MG tablet Take 20 mg by mouth daily as needed. For indigestion    . furosemide (LASIX) 20 MG tablet TAKE 1 TABLET BY MOUTH AS NEEDED FOR  SWELLING (Patient taking differently: Take 20 mg by mouth daily as needed for fluid or edema. ) 90 tablet 2  . losartan (COZAAR) 25 MG tablet Take 1 tablet (25 mg total) by mouth daily. 90 tablet 3  . vitamin C (ASCORBIC ACID) 500 MG tablet Take 500 mg by mouth 2 (two) times daily.    [name] warfarin (COUMADIN) 5 MG tablet Take 2 tablets (10 mg total) by mouth daily. Take 10 mg evening of 9/14 and 9/15. Further per coumadin clinic 30 tablet 1  . carvedilol (COREG) 25 MG tablet Take 1.5 tablets (37.5 mg total) by mouth 2 (two) times daily. 270 tablet 3  . vitamin B-12 (CYANOCOBALAMIN) 1000 MCG tablet Take 1 tablet (1,000 mcg total) by mouth daily. (Patient not taking: Reported on 05/13/2019)     Facility-Administered Medications Prior to Visit  Medication Dose Route Frequency Provider Last Rate Last Dose  . incobotulinumtoxinA (XEOMIN) 50 units injection 50 Units  50 Units Intramuscular Q90 days Marcial Pacas, MD   50 Units at 11/30/16 1546  . incobotulinumtoxinA (XEOMIN) 50 units injection 50 Units  50 Units Intramuscular Q90 days Marcial Pacas, MD   50 Units at 07/25/17 [phone]  . incobotulinumtoxinA (XEOMIN) 50 units injection 50 Units  50 Units Intramuscular  Q90 days Marcial Pacas, MD   50 Units at 07/26/17 1217     ROS Review of Systems  Constitutional: Negative for activity change, appetite change and fatigue.  HENT: Negative for congestion, sinus pressure and sore throat.   Eyes: Negative for visual disturbance.  Respiratory: Negative for cough, chest tightness, shortness of breath and wheezing.   Cardiovascular: Negative for chest pain and palpitations.  Gastrointestinal: Negative for abdominal distention, abdominal pain and constipation.  Endocrine: Negative for polydipsia.  Genitourinary: Negative for dysuria and frequency.  Musculoskeletal: Negative for arthralgias and back pain.  Skin: Negative  for rash.  Neurological: Negative for tremors, light-headedness and numbness.  Hematological: Does not bruise/bleed easily.  Psychiatric/Behavioral: Negative for agitation and behavioral problems.    Objective:  BP (!) 158/95   Pulse 93   Temp 98.2 F (36.8 C) (Oral)   Ht 5\' 8"  (1.727 m)   Wt 201 lb 3.2 oz (91.3 kg)   LMP 05/18/2015 Comment: went for 3 week after skipping two months   SpO2 96%   BMI 30.59 kg/m   BP/Weight 05/13/2019 [id number]  Systolic BP 0000000 [id number]  Diastolic BP 95 91 80  Wt. (Lbs) 201.2 199.4 208  BMI 30.59 - 31.63      Physical Exam Constitutional:      Appearance: She is well-developed.  Cardiovascular:     Rate and Rhythm: Normal rate.     Heart sounds: Normal heart sounds. No murmur.  Pulmonary:     Effort: Pulmonary effort is normal.     Breath sounds: Normal breath sounds. No wheezing or rales.  Chest:     Chest wall: No tenderness.  Abdominal:     General: Bowel sounds are normal. There is no distension.     Palpations: Abdomen is soft. There is no mass.     Tenderness: There is no abdominal tenderness.  Musculoskeletal: Normal range of motion.  Neurological:     Mental Status: She is alert and oriented to person, place, and time.     CMP Latest Ref Rng & Units 05/06/2019  05/05/2019 05/04/2019  Glucose 70 - 99 mg/dL 109(H) 110(H) 107(H)  BUN 6 - 20 mg/dL 10 9 8   Creatinine 0.44 - 1.00 mg/dL 0.69 0.70 0.72  Sodium 135 - 145 mmol/L 139 140 140  Potassium 3.5 - 5.1 mmol/L 4.0 4.3 4.1  Chloride 98 - 111 mmol/L 108 108 108  CO2 22 - 32 mmol/L 23 26 23   Calcium 8.9 - 10.3 mg/dL 8.9 9.4 9.1  Total Protein 6.1 - 8.1 g/dL - - -  Total Bilirubin 0.2 - 1.2 mg/dL - - -  Alkaline Phos 33 - 130 U/L - - -  AST 10 - 35 U/L - - -  ALT 6 - 29 U/L - - -    Lipid Panel     Component Value Date/Time   CHOL 114 11/16/2017 0838   TRIG 68 11/16/2017 0838   HDL 43 11/16/2017 0838   CHOLHDL 2.7 11/16/2017 0838   CHOLHDL 3.8 07/01/2016 0939   VLDL 25 07/01/2016 0939   LDLCALC 57 11/16/2017 0838    CBC    Component Value Date/Time   WBC 5.6 01/21/2019 1029   WBC 6.9 06/23/2015 0938   RBC 3.64 (L) 01/21/2019 1029   RBC 3.73 (L) 06/23/2015 0938   HGB 11.7 01/21/2019 1029   HCT 35.0 01/21/2019 1029   PLT 187 01/21/2019 1029   MCV 96 01/21/2019 1029   MCH 32.1 01/21/2019 1029   MCH 33.0 06/23/2015 0938   MCHC 33.4 01/21/2019 1029   MCHC 33.4 06/23/2015 0938   RDW 12.9 01/21/2019 1029   LYMPHSABS 1.5 06/23/2015 0938   MONOABS 1.0 06/23/2015 0938   EOSABS 0.3 06/23/2015 0938   BASOSABS 0.1 06/23/2015 [id number]    Lab Results  Component Value Date   HGBA1C 5.5 04/12/2016    Assessment & Plan:   1. Other persistent atrial fibrillation Currently in sinus rhythm On anticoagulation with Coumadin-last INR 3.1 on 9/16 (followed by the Coumadin clinic) Discussed medication assistance programs for obtaining Xarelto here in  the clinic should her cardiologist be interested in switching her over to Xarelto or Eliquis  2. Coronary artery disease involving native coronary artery of native heart without angina pectoris Asymptomatic Risk factor modification  3. Essential hypertension Uncontrolled Patient attributes this to whitecoat hypertension coupled with the news of  her sister hospitalized No regimen change today If blood pressure remains elevated at subsequent visit her antihypertensive regimen will be adjusted Counseled on blood pressure goal of less than 130/80, low-sodium, DASH diet, medication compliance, 150 minutes of moderate intensity exercise per week. Discussed medication compliance, adverse effects.   Health Care Maintenance: At next visit No orders of the defined types were placed in this encounter.   Follow-up: Return in about 1 month (around 06/12/2019) for complete physical.       [name], MD, FAAFP. [facility] [place], [place]   05/13/2019, 3:13 PM

## 2019-05-15 ENCOUNTER — Ambulatory Visit (HOSPITAL_COMMUNITY)
Admission: RE | Admit: 2019-05-15 | Discharge: 2019-05-15 | Disposition: A | Discharge status: Home / Self Care | Payer: Self-pay | Source: Ambulatory Visit | Attending: Nurse Practitioner | Admitting: Nurse Practitioner

## 2019-05-15 ENCOUNTER — Other Ambulatory Visit: Payer: Self-pay

## 2019-05-15 ENCOUNTER — Encounter (HOSPITAL_COMMUNITY): Payer: Self-pay | Admitting: Physician Assistant

## 2019-05-15 VITALS — BP 150/96 | HR 95 | Ht 68.0 in | Wt 199.2 lb

## 2019-05-15 DIAGNOSIS — I5042 Chronic combined systolic (congestive) and diastolic (congestive) heart failure: Secondary | ICD-10-CM | POA: Insufficient documentation

## 2019-05-15 DIAGNOSIS — I4819 Other persistent atrial fibrillation: Secondary | ICD-10-CM | POA: Insufficient documentation

## 2019-05-15 DIAGNOSIS — Z91040 Latex allergy status: Secondary | ICD-10-CM | POA: Insufficient documentation

## 2019-05-15 DIAGNOSIS — E785 Hyperlipidemia, unspecified: Secondary | ICD-10-CM | POA: Insufficient documentation

## 2019-05-15 DIAGNOSIS — I4510 Unspecified right bundle-branch block: Secondary | ICD-10-CM | POA: Insufficient documentation

## 2019-05-15 DIAGNOSIS — Z87891 Personal history of nicotine dependence: Secondary | ICD-10-CM | POA: Insufficient documentation

## 2019-05-15 DIAGNOSIS — I429 Cardiomyopathy, unspecified: Secondary | ICD-10-CM | POA: Insufficient documentation

## 2019-05-15 DIAGNOSIS — Z7901 Long term (current) use of anticoagulants: Secondary | ICD-10-CM | POA: Insufficient documentation

## 2019-05-15 DIAGNOSIS — E669 Obesity, unspecified: Secondary | ICD-10-CM | POA: Insufficient documentation

## 2019-05-15 DIAGNOSIS — Z823 Family history of stroke: Secondary | ICD-10-CM | POA: Insufficient documentation

## 2019-05-15 DIAGNOSIS — I4892 Unspecified atrial flutter: Secondary | ICD-10-CM | POA: Insufficient documentation

## 2019-05-15 DIAGNOSIS — I484 Atypical atrial flutter: Secondary | ICD-10-CM

## 2019-05-15 DIAGNOSIS — Z8249 Family history of ischemic heart disease and other diseases of the circulatory system: Secondary | ICD-10-CM | POA: Insufficient documentation

## 2019-05-15 DIAGNOSIS — Z881 Allergy status to other antibiotic agents status: Secondary | ICD-10-CM | POA: Insufficient documentation

## 2019-05-15 DIAGNOSIS — Z8673 Personal history of transient ischemic attack (TIA), and cerebral infarction without residual deficits: Secondary | ICD-10-CM | POA: Insufficient documentation

## 2019-05-15 DIAGNOSIS — Z79899 Other long term (current) drug therapy: Secondary | ICD-10-CM | POA: Insufficient documentation

## 2019-05-15 DIAGNOSIS — I2510 Atherosclerotic heart disease of native coronary artery without angina pectoris: Secondary | ICD-10-CM | POA: Insufficient documentation

## 2019-05-15 DIAGNOSIS — Z6830 Body mass index (BMI) 30.0-30.9, adult: Secondary | ICD-10-CM | POA: Insufficient documentation

## 2019-05-15 DIAGNOSIS — I110 Hypertensive heart disease with heart failure: Secondary | ICD-10-CM | POA: Insufficient documentation

## 2019-05-15 DIAGNOSIS — Z951 Presence of aortocoronary bypass graft: Secondary | ICD-10-CM | POA: Insufficient documentation

## 2019-05-15 LAB — COMPREHENSIVE METABOLIC PANEL
ALT: 25 U/L (ref 0–44)
AST: 19 U/L (ref 15–41)
Albumin: 4 g/dL (ref 3.5–5.0)
Alkaline Phosphatase: 135 U/L — ABNORMAL HIGH (ref 38–126)
Anion gap: 9 (ref 5–15)
BUN: 8 mg/dL (ref 6–20)
CO2: 26 mmol/L (ref 22–32)
Calcium: 9.4 mg/dL (ref 8.9–10.3)
Chloride: 104 mmol/L (ref 98–111)
Creatinine, Ser: 0.84 mg/dL (ref 0.44–1.00)
GFR calc Af Amer: >60 mL/min (ref >60)
GFR calc non Af Amer: >60 mL/min (ref >60)
Glucose, Bld: 117 mg/dL — ABNORMAL HIGH (ref 70–99)
Potassium: 4 mmol/L (ref 3.5–5.1)
Sodium: 139 mmol/L (ref 135–145)
Total Bilirubin: 1.9 mg/dL — ABNORMAL HIGH (ref 0.3–1.2)
Total Protein: 7 g/dL (ref 6.5–8.1)

## 2019-05-15 NOTE — Progress Notes (Signed)
Primary Care Physician: Patient, No Pcp Per Primary Cardiologist: Dr Harrington Challenger Primary Electrophysiologist: Dr Caryl Comes Referring Physician: Dr Charma Igo Ashley [name] is a 53 y.o. female with a history of atrial fibrillation, atrial flutter w/ RVR in 10/2018 with associate LV dysfunction, CAD s/p CABG 2013, MVP with severe MR s/p repair, CVA, HTN, and tobacco abuse who presents for follow up in the AF Clinic. Patient underwent DCCV in 01/2019 but had ERAF.  She developed profound bradycardia and junctional rhythm and her Digoxin and Diltiazem was DC'd. Her rate was uncontrolled again and she was symptomatic. She then saw Dr. Caryl Comes who recommended Dofetilide. She was admitted for dofetilide loading but unfortunately had QT prolongation on the full dose and breakthrough afib on lower doses. She was started on amiodarone 05/06/19.  On follow up today, patient reports that she has done very well. She states that her energy level has increased and she is able to walk farther than she has in months. She is tolerating the medication well.   Today, she denies symptoms of palpitations, chest pain, shortness of breath, orthopnea, PND, lower extremity edema, dizziness, presyncope, syncope, snoring, daytime somnolence, bleeding, or neurologic sequela. The patient is tolerating medications without difficulties and is otherwise without complaint today.    she has a BMI of Body mass index is 30.29 kg/m.[name] Filed Weights   05/15/19 1343  Weight: 90.4 kg    Family History  Problem Relation Age of Onset  . Hypertension Mother   . Stroke Mother   . Heart disease Father   . COPD Father   . Anesthesia problems Neg Hx   . Hypotension Neg Hx   . Malignant hyperthermia Neg Hx   . Pseudochol deficiency Neg Hx      Atrial Fibrillation Management history:  Previous antiarrhythmic drugs: dofetilide (failed), amiodarone Previous cardioversions: 01/25/19 Previous ablations: none CHADS2VASC score: 6  Anticoagulation history: warfarin   Past Medical History:  Diagnosis Date  . Abdominal pain    ? chronic cholecystitis; admxn to [facility] 9/12 (CT, USN, HIDA done)  . Atrial fibrillation ([place])   . Cardiomyopathy    echo 9/12: mild LVH, EF 35-40%, mod MR (difficult to judge /post leaflet restricted), mod BAE, mod RVE, mod TR   . Cardiomyopathy ([place])    a. echo 9/12: mild LVH, EF 35-40%, mod MR (difficult to judge /post leaflet restricted), mod BAE, mod RVE, mod TR. b. Echo 11/2011: mild LVH, EF 55-60%, s/p MV repair w/o sig MR/MS, mildly dilated RV/RA, PASP 12mmHg. // AFL dx 10/2018 - Echo 10/2018: EF 30, diff HK, s/p MV repair with trivial MR, mean MV 7 but not c/w significant mitral stenosis, mild RAE, severe LAE, mod reduced RVSF   . Carotid artery disease ([place])    a. Duplex 06/2015: 50% RECA, 1-39% BICA.  [name] Coronary artery disease    a. s/p CABG 10/2011 - LIMA-LAD, SVG-PDA, SVG-OM, SVG-diagonal.  . Dyslipidemia   . Essential hypertension   . Hemifacial spasm   . History of Doppler ultrasound    a. Carotid US [facility] in [place], [name]) 7/17: no hemodynamically significant ICA stenosis  . Mitral regurgitation    a. s/p MV repair 10/2011 - on Coumadin for 3 months afterwards then d/c'd by surgery.  . Recurrent upper respiratory infection (URI)   . 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 Stroke [facility])    a. age 67 - in setting of cigs  and OCPs. //  b. s/p acute L parietal CVA >> tPA - [facility] in [place], [name]  . Tobacco abuse    Past Surgical History:  Procedure Laterality Date  . CARDIAC CATHETERIZATION    . CARDIOVERSION N/A 01/25/2019   Procedure: CARDIOVERSION;  Surgeon: [name], MD;  Location: [facility] ENDOSCOPY;  Service: Cardiovascular;  Laterality: N/A;  . CESAREAN SECTION    . cesarean section    . CORONARY ARTERY BYPASS GRAFT  10/21/2011   Procedure: CORONARY ARTERY BYPASS GRAFTING (CABG);  Surgeon: [name], MD;  Location: [place];  Service: Open Heart Surgery;  Laterality: N/A;  cabg x four, using right leg greater saphenous vein harvested endoscopically  . DENTAL SURGERY    . MITRAL VALVE REPAIR  10/21/2011   Procedure: MITRAL VALVE REPAIR (MVR);  Surgeon: [name], MD;  Location: [place];  Service: Open Heart Surgery;  Laterality: N/A;  . TEE WITHOUT CARDIOVERSION  06/30/2011   Procedure: TRANSESOPHAGEAL ECHOCARDIOGRAM (TEE);  Surgeon: [name], MD;  Location: [facility] ENDOSCOPY;  Service: Cardiovascular;  Laterality: N/A;  . TONSILLECTOMY    . TUBAL LIGATION      Current Outpatient Medications  Medication Sig Dispense Refill  . amiodarone (PACERONE) 200 MG tablet Take 1 tablet (200 mg total) by mouth 2 (two) times daily. 60 tablet 3  . atorvastatin (LIPITOR) 80 MG tablet Take 1 tablet (80 mg total) by mouth daily at 6 PM. 90 tablet 3  . carvedilol (COREG) 25 MG tablet Take 1.5 tablets (37.5 mg total) by mouth 2 (two) times daily. 270 tablet 3  . diltiazem (CARDIZEM CD) 120 MG 24 hr capsule Take 1 capsule (120 mg total) by mouth daily. 30 capsule 6  . famotidine (PEPCID) 20 MG tablet Take 20 mg by mouth daily as needed. For indigestion    . furosemide (LASIX) 20 MG tablet TAKE 1 TABLET BY MOUTH AS NEEDED FOR  SWELLING (Patient taking differently: Take 20 mg by mouth daily as needed for fluid or edema. ) 90 tablet 2  . losartan (COZAAR) 25 MG tablet Take 1 tablet (25 mg total) by mouth daily. 90 tablet 3  . vitamin B-12 (CYANOCOBALAMIN) 1000 MCG tablet Take 1 tablet (1,000 mcg total) by mouth daily.    . vitamin C (ASCORBIC ACID) 500 MG tablet Take 500 mg by mouth 2 (two) times daily.    [name] warfarin (COUMADIN) 5 MG tablet Take 2 tablets (10 mg total) by mouth daily. Take 10 mg evening of 9/14 and 9/15. Further per coumadin clinic 30 tablet 1   Current Facility-Administered Medications  Medication Dose Route Frequency Provider Last Rate Last Dose  . incobotulinumtoxinA (XEOMIN) 50 units injection 50 Units  50 Units  Intramuscular Q90 days Marcial Pacas, MD   50 Units at 11/30/16 1546  . incobotulinumtoxinA (XEOMIN) 50 units injection 50 Units  50 Units Intramuscular Q90 days Marcial Pacas, MD   50 Units at 07/25/17 [phone]  . incobotulinumtoxinA (XEOMIN) 50 units injection 50 Units  50 Units Intramuscular Q90 days Marcial Pacas, MD   50 Units at 07/26/17 1217    Allergies  Allergen Reactions  . Azithromycin Nausea Only  . Latex Rash    rash  . Spinach Hives    Social History   Socioeconomic History  . Marital status: Married    Spouse name: Not on file  . Number of children: 89  . Years of education: 2  . Highest education level: Not  on file  Occupational History  . 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  Social Needs  . Financial resource strain: Not on file  . Food insecurity    Worry: Not on file    Inability: Not on file  . Transportation needs    Medical: Not on file    Non-medical: Not on file  Tobacco Use  . Smoking status: Former Smoker    Packs/day: 1.00    Years: 30.00    Pack years: 30.00    Quit date: 05/07/2011    Years since quitting: 8.0  . Smokeless tobacco: Never Used  Substance and Sexual Activity  . Alcohol use: Yes    Alcohol/week: 1.0 standard drinks    Types: 1 Glasses of wine per week    Comment: 1 glass of red wine nightly  . Drug use: No  . Sexual activity: Yes  Lifestyle  . Physical activity    Days per week: Not on file    Minutes per session: Not on file  . Stress: Not on file  Relationships  . Social Herbalist on phone: Not on file    Gets together: Not on file    Attends religious service: Not on file    Active member of club or organization: Not on file    Attends meetings of clubs or organizations: Not on file    Relationship status: Not on file  . Intimate partner violence    Fear of current or ex partner: Not on file    Emotionally abused: Not on file    Physically abused: Not on file    Forced  sexual activity: Not on file  Other Topics Concern  . Not on file  Social History Narrative   Lives at home with husband and son.   Right-handed.   2 cups caffeine daily.     ROS- All systems are reviewed and negative except as per the HPI above.  Physical Exam: Vitals:   05/15/19 1343  BP: (!) 150/96  Pulse: 95  Weight: 90.4 kg  Height: 5\' 8"  (1.727 m)    GEN- The patient is well appearing obese female, alert and oriented x 3 today.   HEENT-head normocephalic, atraumatic, sclera clear, conjunctiva pink, hearing intact, trachea midline. Lungs- Clear to ausculation bilaterally, normal work of breathing Heart- slightly irregular rate and rhythm, no murmurs, rubs or gallops  GI- soft, NT, ND, + BS Extremities- no clubbing, cyanosis, or edema MS- no significant deformity or atrophy Skin- no rash or lesion Psych- euthymic mood, full affect Neuro- strength and sensation are intact   Wt Readings from Last 3 Encounters:  05/15/19 90.4 kg  05/13/19 91.3 kg  05/06/19 90.4 kg    EKG today demonstrates atypical atrial flutter with 2:1 conduction, RAD, QRS 104, QTc 490  Echo 03/05/19 demonstrated  1. The left ventricle has normal systolic function, with an ejection fraction of 60-65%. The cavity size was normal.  2. The right ventricle has normal systolc function. The cavity was normal. There is no increase in right ventricular wall thickness.  3. Aortic valve regurgitation was not assessed by color flow Doppler.  4. Pulmonic valve regurgitation was not assessed by color flow Doppler.  Epic records are reviewed at length today  Assessment and Plan:  1. Persistent atrial fibrillation/atrial flutter Patient recently admitted for dofetilide loading. Unfortunately, her QT lengthened and she had breakthrough afib on lower doses. She was started on amiodarone 05/06/19.  Per Dr Rayann Heman, with substantial atriopathy s/p prior valve repair, and no insurance, she is thought to be a poor  ablation candidate. Her symptoms have greatly improved since starting amiodarone.  Continue loading on amiodarone 200 mg BID for now. Could consider DCCV once loaded on amiodarone. Check Cmet today, normal TSH 10/2018 noted.  Patient reports she can get Xarelto at low/no cost at [facility] and wellness.  Continue warfarin for now until she is approved and will then plan to transition to Xarelto 20 mg daily.   This patients CHA2DS2-VASc Score and unadjusted Ischemic Stroke Rate (% per year) is equal to 9.7 % stroke rate/year from a score of 6  Above score calculated as 1 point each if present [CHF, HTN, DM, Vascular=MI/PAD/Aortic Plaque, Age if 65-74, or Female] Above score calculated as 2 points each if present [Age > 75, or Stroke/TIA/TE]   2. Obesity Body mass index is 30.29 kg/m. Lifestyle modification was discussed and encouraged including regular physical activity and weight reduction.   3. CAD S/p CAGB. No anginal symptoms. Continue present therapy and risk factor modification.   4. Combined chronic systolic/diastolic CHF Recent echo shows EF 60-65% No signs or symptoms of fluid overload. Continue Coreg.   5. MVP with MR S/p repair 2013.   Follow up with Dr Caryl Comes as scheduled.    [facility] [address] [place], [place] [address] [phone] 05/15/2019 2:32 PM

## 2019-05-15 NOTE — Patient Instructions (Signed)
Continue amiodarone twice a day until follow up with Dr. Caryl Comes.  Be in touch with [facility] and Wellness for Xarelto assistance - once approved let the coumadin clinic know so that they can transition you from coumadin to xarelto.

## 2019-05-16 ENCOUNTER — Encounter: Payer: Self-pay | Admitting: *Deleted

## 2019-05-16 ENCOUNTER — Other Ambulatory Visit: Payer: Self-pay | Admitting: Family Medicine

## 2019-05-16 MED ORDER — RIVAROXABAN 20 MG PO TABS: 20.0000 mg | ORAL_TABLET | Freq: Every day | ORAL | 1 refills | Status: DC

## 2019-05-16 MED FILL — XARELTO 20 MG TABLET: 20 | 30 days supply | Qty: 30 | Fill #0

## 2019-05-16 NOTE — Progress Notes (Signed)
Can you please assist this patient with Xarelto via patient assistance? She will need whatever we have in house meanwhile. Thanks

## 2019-05-17 ENCOUNTER — Other Ambulatory Visit: Payer: Self-pay

## 2019-05-17 ENCOUNTER — Ambulatory Visit (INDEPENDENT_AMBULATORY_CARE_PROVIDER_SITE_OTHER): Payer: Self-pay | Admitting: *Deleted

## 2019-05-17 DIAGNOSIS — I4891 Unspecified atrial fibrillation: Secondary | ICD-10-CM

## 2019-05-17 DIAGNOSIS — Z8673 Personal history of transient ischemic attack (TIA), and cerebral infarction without residual deficits: Secondary | ICD-10-CM

## 2019-05-17 DIAGNOSIS — Z5181 Encounter for therapeutic drug level monitoring: Secondary | ICD-10-CM

## 2019-05-17 LAB — POCT INR: INR: 2.3 (ref 2.0–3.0)

## 2019-05-17 NOTE — Patient Instructions (Signed)
Description   Continue taking 1 tablet daily except for 1/2 a tablet on Sunday, Wednesday and Friday.  Recheck INR in 2 weeks. Call Coumadin Clinic [phone] if you have any changes in your medication or upcoming procedures. Pt stated she would try to bring Xarelto paperwork to help with financial assistance.

## 2019-05-20 NOTE — Telephone Encounter (Signed)
This encounter was created in error - please disregard.

## 2019-05-31 ENCOUNTER — Ambulatory Visit (INDEPENDENT_AMBULATORY_CARE_PROVIDER_SITE_OTHER): Payer: Self-pay

## 2019-05-31 ENCOUNTER — Other Ambulatory Visit: Payer: Self-pay

## 2019-05-31 DIAGNOSIS — I4891 Unspecified atrial fibrillation: Secondary | ICD-10-CM

## 2019-05-31 DIAGNOSIS — Z8673 Personal history of transient ischemic attack (TIA), and cerebral infarction without residual deficits: Secondary | ICD-10-CM

## 2019-05-31 DIAGNOSIS — Z5181 Encounter for therapeutic drug level monitoring: Secondary | ICD-10-CM

## 2019-05-31 LAB — POCT INR: INR: 3.8 — AB (ref 2.0–3.0)

## 2019-05-31 NOTE — Patient Instructions (Signed)
Description   Skip today's dosage of Coumadin, then resume same dosage 1 tablet daily except for 1/2 a tablet on Sundays, Wednesdays and Fridays.  Recheck INR in 2 weeks. Call Coumadin Clinic [phone] if you have any changes in your medication or upcoming procedures. Pt stated she would try to bring Xarelto paperwork to help with financial assistance.

## 2019-06-05 ENCOUNTER — Other Ambulatory Visit: Payer: Self-pay | Admitting: Internal Medicine

## 2019-06-05 ENCOUNTER — Encounter: Payer: Self-pay | Admitting: Internal Medicine

## 2019-06-05 ENCOUNTER — Other Ambulatory Visit: Payer: Self-pay

## 2019-06-05 ENCOUNTER — Ambulatory Visit (INDEPENDENT_AMBULATORY_CARE_PROVIDER_SITE_OTHER): Payer: Self-pay | Admitting: Internal Medicine

## 2019-06-05 VITALS — BP 152/88 | HR 85 | Ht 68.0 in | Wt 198.0 lb

## 2019-06-05 DIAGNOSIS — Z9889 Other specified postprocedural states: Secondary | ICD-10-CM

## 2019-06-05 DIAGNOSIS — R0602 Shortness of breath: Secondary | ICD-10-CM

## 2019-06-05 DIAGNOSIS — I5042 Chronic combined systolic (congestive) and diastolic (congestive) heart failure: Secondary | ICD-10-CM

## 2019-06-05 DIAGNOSIS — Z951 Presence of aortocoronary bypass graft: Secondary | ICD-10-CM

## 2019-06-05 DIAGNOSIS — I4819 Other persistent atrial fibrillation: Secondary | ICD-10-CM

## 2019-06-05 MED ORDER — AMIODARONE HCL 200 MG PO TABS: 200.0000 mg | ORAL_TABLET | Freq: Every day | ORAL | 3 refills | Status: DC

## 2019-06-05 MED ORDER — AMLODIPINE BESYLATE 5 MG PO TABS: 5.0000 mg | ORAL_TABLET | Freq: Every day | ORAL | 3 refills | Status: DC

## 2019-06-05 NOTE — Telephone Encounter (Signed)
New Message   *STAT* If patient is at the pharmacy, call can be transferred to refill team.   1. Which medications need to be refilled? (please list name of each medication and dose if known) amLODipine (NORVASC) 5 MG tablet  2. Which pharmacy/location (including street and city if local pharmacy) is medication to be sent to? [place], [place].  3. Do they need a 30 day or 90 day supply? 30 day

## 2019-06-05 NOTE — Telephone Encounter (Signed)
Pt's medication was sent to pt's pharmacy as requested. Confirmation received.  °

## 2019-06-05 NOTE — Patient Instructions (Addendum)
Medication Instructions:  Your physician has recommended you make the following change in your medication:   1. Decrease your amiodarone 200 mg--  Take one tablet by mouth daily  2.  Start amlodipine 5 mg-  Take one tablet by mouth daily  3.  Stop taking cardizem   Labwork: You will get lab work today:  TSH  Testing/Procedures: Your physician has recommended that you have a pulmonary function test. Pulmonary Function Tests are a group of tests that measure how well air moves in and out of your lungs.  Please schedule for PFT's AFTER her cardioversion  Your physician has recommended that you have a Cardioversion (DCCV). Electrical Cardioversion uses a jolt of electricity to your heart either through paddles or wired patches attached to your chest. This is a controlled, usually prescheduled, procedure. Defibrillation is done under light anesthesia in the hospital, and you usually go home the day of the procedure. This is done to get your heart back into a normal rhythm. You are not awake for the procedure. Please see the instruction sheet given to you today.   Follow-Up: Your physician wants you to follow-up in: 4 weeks with the AFIB clinic AFTER CARDIOVERSION  Any Other Special Instructions Will Be Listed Below (If Applicable).  If you need a refill on your cardiac medications before your next appointment, please call your pharmacy.     You are scheduled for a Cardioversion on June 14, 2019 with Dr. Debara Pickett.  Please arrive at the [facility] (Main Entrance A) at [facility]: [address] [place], [place] [address] at 9:00 am.   Your procedure is scheduled for 10:30 am  DIET: Nothing to eat or drink after midnight except a sip of water with medications (see medication instructions below)  Medication Instructions: Take all your normal morning medications with a sip of water EXCEPT for LASIX   Labs: your lab work will be done at the hospital prior to your procedure -  you will need to arrive 1  hours ahead of your procedure  YOUR COVID TEST IS SCHEDULED FOR June 11, 2019 AT 10:30 AM AT [place] must have a responsible person to drive you home and stay in the waiting area during your procedure. Failure to do so could result in cancellation.  Bring your insurance cards.  *Special Note: Every effort is made to have your procedure done on time. Occasionally there are emergencies that occur at the hospital that may cause delays. Please be patient if a delay does occur.

## 2019-06-05 NOTE — Progress Notes (Signed)
Patient Care Team: [name], MD as PCP - General (Family Medicine) [name], MD as PCP - Cardiology (Cardiology) [name], MD as PCP - Electrophysiology (Cardiology) [name], MD as Consulting Physician (General Surgery)   HPI  Ashley [name] is a 53 y.o. female seen in followup for atrial flutter initially noted 3.20  Cardioversion was delayed because of COVID. Cardioversion 6/20 was followed by sinus rhythm.  Unfortunately, she reverted soon to atrial fibrillation however her heart rate was quite slow.  Digoxin was reduced.  Heart rates remained low and diltiazem was discontinued. More recently, she has had problems with recurrent tachypalpitations.  Discussions 8/20 regarding antiarrhythmic therapy versus catheter ablation resulted in decision to proceed with dofetilide.  She was admitted and reverted to sinus rhythm; however, QT prolongation prompted decreased dose and reemergence of atrial fibrillation.  Amiodarone was initiated.  Because of cost, she has been treated with Coumadin.       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  Event recorder that time showed no atrial fibrillation   DATE TEST EF   3/13 Echo   35-40 %   4/13 Echo  55-65%   3/20  Echo  30% LA 51//51  7/20 Echo  60-65% No LA measurement    Date Cr K Hgb TSH LFTs  3/20    11.7 (6/20) 1.6    9/20 0.84 4.0    19      Thromboembolic risk factors (, HTN-1, TIA/CVA-2, Vasc disease -1, CHF-1, Gender-1) for a CHADSVASc Score of >=6   Records and Results Reviewed   Past Medical History:  Diagnosis Date  . Abdominal pain    ? chronic cholecystitis; admxn to [facility] 9/12 (CT, USN, HIDA done)  . Atrial fibrillation ([place])   . Cardiomyopathy    echo 9/12: mild LVH, EF 35-40%, mod MR (difficult to judge /post leaflet restricted), mod BAE, mod RVE, mod TR   . Cardiomyopathy ([place])    a. echo 9/12: mild LVH, EF 35-40%, mod MR (difficult to judge /post leaflet restricted), mod BAE, mod RVE, mod TR. b. Echo 11/2011: mild LVH, EF 55-60%, s/p MV repair w/o sig MR/MS, mildly dilated RV/RA, PASP 61mmHg. // AFL dx 10/2018 - Echo 10/2018: EF 30, diff HK, s/p MV repair with trivial MR, mean MV 7 but not c/w significant mitral stenosis, mild RAE, severe LAE, mod reduced RVSF   . Carotid artery disease ([place])    a. Duplex 06/2015: 50% RECA, 1-39% BICA.  [name] Coronary artery disease    a. s/p CABG 10/2011 - LIMA-LAD, SVG-PDA, SVG-OM, SVG-diagonal.  . Dyslipidemia   . Essential hypertension   . Hemifacial spasm   . History of Doppler ultrasound    a. Carotid US [facility] in [place], [name]) 7/17: no hemodynamically significant ICA stenosis  . Mitral regurgitation    a. s/p MV repair 10/2011 - on Coumadin for 3 months afterwards then d/c'd by surgery.  . Recurrent upper respiratory infection (URI)   . 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 Stroke [facility])    a. age 54 - in setting of cigs and OCPs. //  b. 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 Tobacco abuse     Past Surgical History:  Procedure Laterality Date  . CARDIAC CATHETERIZATION    . CARDIOVERSION N/A 01/25/2019   Procedure: CARDIOVERSION;  Surgeon: [name], MD;  Location: [facility] ENDOSCOPY;  Service: Cardiovascular;  Laterality: N/A;  . CESAREAN SECTION    . cesarean section    . CORONARY ARTERY BYPASS GRAFT  10/21/2011   Procedure: CORONARY ARTERY BYPASS GRAFTING (CABG);  Surgeon: [name], MD;  Location: [place];  Service: Open Heart Surgery;  Laterality: N/A;  cabg x four, using right leg greater saphenous vein harvested endoscopically  . DENTAL SURGERY    . MITRAL VALVE REPAIR  10/21/2011   Procedure: MITRAL VALVE REPAIR  (MVR);  Surgeon: [name], MD;  Location: [place];  Service: Open Heart Surgery;  Laterality: N/A;  . TEE WITHOUT CARDIOVERSION  06/30/2011   Procedure: TRANSESOPHAGEAL ECHOCARDIOGRAM (TEE);  Surgeon: [name], MD;  Location: [facility] ENDOSCOPY;  Service: Cardiovascular;  Laterality: N/A;  . TONSILLECTOMY    . TUBAL LIGATION      Current Meds  Medication Sig  . amiodarone (PACERONE) 200 MG tablet Take 1 tablet (200 mg total) by mouth 2 (two) times daily.  [name] atorvastatin (LIPITOR) 80 MG tablet Take 1 tablet (80 mg total) by mouth daily at 6 PM.  . carvedilol (COREG) 25 MG tablet Take 1.5 tablets (37.5 mg total) by mouth 2 (two) times daily.  [name] diltiazem (CARDIZEM CD) 120 MG 24 hr capsule Take 1 capsule (120 mg total) by mouth daily.  . famotidine (PEPCID) 20 MG tablet Take 20 mg by mouth daily as needed. For indigestion  . furosemide (LASIX) 20 MG tablet TAKE 1 TABLET BY MOUTH AS NEEDED FOR  SWELLING (Patient taking differently: Take 20 mg by mouth daily as needed for fluid or edema. )  . losartan (COZAAR) 25 MG tablet Take 1 tablet (25 mg total) by mouth daily.  . vitamin B-12 (CYANOCOBALAMIN) 1000 MCG tablet Take 1 tablet (1,000 mcg total) by mouth daily.  . vitamin C (ASCORBIC ACID) 500 MG tablet Take 500 mg by mouth 2 (two) times daily.  [name] warfarin (COUMADIN) 5 MG tablet Take 2 tablets (10 mg total) by mouth daily. Take 10 mg evening of 9/14 and 9/15. Further per coumadin clinic   Current Facility-Administered Medications for the 06/05/19 encounter (Office Visit) with [name], MD  Medication  . incobotulinumtoxinA (XEOMIN) 50 units injection 50 Units  . incobotulinumtoxinA (XEOMIN) 50 units injection 50 Units  . incobotulinumtoxinA (XEOMIN) 50 units injection 50 Units    Allergies  Allergen Reactions  . Azithromycin Nausea Only  . Latex Rash    rash  . Spinach Hives      Review of Systems negative except from HPI and PMH  Physical Exam BP (!) 152/88   Pulse 85    Ht 5\' 8"  (1.727 m)   Wt 198 lb (89.8 kg)   LMP 05/18/2015 Comment: went for 3 week after skipping two months   SpO2 99%   BMI 30.11 kg/m  Well developed and well nourished in no acute distress HENT R ptosis  E scleral and icterus clear Neck Supple JVP 8+ HJR carotids brisk and full Clear to ausculation iRREgular rate and rhythm, no murmurs gallops or rub Soft with active bowel sounds No clubbing cyanosis  Edema Alert and oriented, grossly normal motor and sensory function Skin Warm and Dry    Assessment  and  Plan Atrial fibrillation/flutter with a controlled ventricular rate  Mitral valve prolapse--repair/CABG  Left atriopathy  Obesity  Right ptosis-chronic     Persistent atrial fibrillation/flutter on amiodarone.  Feels better with rate control.  At this juncture, she has been therapeutic for 4 weeks on her INRs.  We will undertake cardioversion.  We will reduce her amiodarone dose from 400--200 mg a day.  She will need a TSH.  We will need to plan pulmonary function testing following restoration of sinus rhythm  She was put on calcium blockers in conjunction with her beta-blockers for rate control; as we approach cardioversion, we will discontinue her diltiazem.  She has previously treated with amlodipine 5 mg.  We will resume after blood pressure control.  In the event that she needs augmentation, we can increase losartan or add a low-dose diuretic.      Current medicines are reviewed at length with the patient today .  The patient does not  have concerns regarding medicines.

## 2019-06-06 ENCOUNTER — Telehealth: Payer: Self-pay | Admitting: Internal Medicine

## 2019-06-06 LAB — TSH: TSH: 2.49 u[IU]/mL (ref 0.450–4.500)

## 2019-06-07 NOTE — Telephone Encounter (Signed)
Printed

## 2019-06-11 ENCOUNTER — Other Ambulatory Visit (HOSPITAL_COMMUNITY)
Admission: RE | Admit: 2019-06-11 | Discharge: 2019-06-11 | Disposition: A | Discharge status: Home / Self Care | Payer: Self-pay | Source: Ambulatory Visit | Attending: Internal Medicine | Admitting: Internal Medicine

## 2019-06-11 DIAGNOSIS — Z01812 Encounter for preprocedural laboratory examination: Secondary | ICD-10-CM | POA: Insufficient documentation

## 2019-06-11 DIAGNOSIS — Z20828 Contact with and (suspected) exposure to other viral communicable diseases: Secondary | ICD-10-CM | POA: Insufficient documentation

## 2019-06-12 ENCOUNTER — Telehealth: Payer: Self-pay | Admitting: Internal Medicine

## 2019-06-12 NOTE — Telephone Encounter (Signed)
Follow Up:      Pt wanted to know if his COVID Test results are back from yesterday? Pt has a procedure on Friday(06-14-19)

## 2019-06-12 NOTE — Telephone Encounter (Signed)
Spoke with pt and made her aware that test has not came back in yet.  Advised she will be contacted if it is positive, otherwise, plan to come for DCCV as scheduled.  Pt appreciative for call.

## 2019-06-13 LAB — NOVEL CORONAVIRUS, NAA (HOSP ORDER, SEND-OUT TO REF LAB; TAT 18-24 HRS): SARS-CoV-2, NAA: NOT DETECTED

## 2019-06-14 ENCOUNTER — Other Ambulatory Visit: Payer: Self-pay

## 2019-06-14 ENCOUNTER — Ambulatory Visit (HOSPITAL_COMMUNITY): Payer: Self-pay | Admitting: Certified Registered Nurse Anesthetist

## 2019-06-14 ENCOUNTER — Observation Stay (HOSPITAL_COMMUNITY)
Admission: RE | Admit: 2019-06-14 | Discharge: 2019-06-15 | Disposition: A | Discharge status: Home / Self Care | Payer: Self-pay | Attending: Internal Medicine | Admitting: Internal Medicine

## 2019-06-14 ENCOUNTER — Encounter (HOSPITAL_COMMUNITY): Payer: Self-pay | Admitting: Anesthesiology

## 2019-06-14 ENCOUNTER — Ambulatory Visit (INDEPENDENT_AMBULATORY_CARE_PROVIDER_SITE_OTHER): Payer: Self-pay | Admitting: *Deleted

## 2019-06-14 ENCOUNTER — Encounter (HOSPITAL_COMMUNITY)
Admission: RE | Disposition: A | Discharge status: Home / Self Care | Payer: Self-pay | Source: Home / Self Care | Attending: Internal Medicine

## 2019-06-14 DIAGNOSIS — Z951 Presence of aortocoronary bypass graft: Secondary | ICD-10-CM | POA: Insufficient documentation

## 2019-06-14 DIAGNOSIS — I4891 Unspecified atrial fibrillation: Secondary | ICD-10-CM | POA: Diagnosis present

## 2019-06-14 DIAGNOSIS — I429 Cardiomyopathy, unspecified: Secondary | ICD-10-CM | POA: Insufficient documentation

## 2019-06-14 DIAGNOSIS — I97121 Postprocedural cardiac arrest following other surgery: Secondary | ICD-10-CM | POA: Insufficient documentation

## 2019-06-14 DIAGNOSIS — Z7901 Long term (current) use of anticoagulants: Secondary | ICD-10-CM | POA: Insufficient documentation

## 2019-06-14 DIAGNOSIS — Z9889 Other specified postprocedural states: Secondary | ICD-10-CM

## 2019-06-14 DIAGNOSIS — Z8673 Personal history of transient ischemic attack (TIA), and cerebral infarction without residual deficits: Secondary | ICD-10-CM | POA: Insufficient documentation

## 2019-06-14 DIAGNOSIS — I2510 Atherosclerotic heart disease of native coronary artery without angina pectoris: Secondary | ICD-10-CM | POA: Diagnosis present

## 2019-06-14 DIAGNOSIS — I10 Essential (primary) hypertension: Secondary | ICD-10-CM | POA: Insufficient documentation

## 2019-06-14 DIAGNOSIS — Z79899 Other long term (current) drug therapy: Secondary | ICD-10-CM | POA: Insufficient documentation

## 2019-06-14 DIAGNOSIS — I959 Hypotension, unspecified: Secondary | ICD-10-CM | POA: Insufficient documentation

## 2019-06-14 DIAGNOSIS — Z955 Presence of coronary angioplasty implant and graft: Secondary | ICD-10-CM | POA: Insufficient documentation

## 2019-06-14 DIAGNOSIS — Z881 Allergy status to other antibiotic agents status: Secondary | ICD-10-CM | POA: Insufficient documentation

## 2019-06-14 DIAGNOSIS — T8111XA Postprocedural  cardiogenic shock, initial encounter: Secondary | ICD-10-CM | POA: Diagnosis not present

## 2019-06-14 DIAGNOSIS — E785 Hyperlipidemia, unspecified: Secondary | ICD-10-CM | POA: Insufficient documentation

## 2019-06-14 DIAGNOSIS — Z87891 Personal history of nicotine dependence: Secondary | ICD-10-CM | POA: Insufficient documentation

## 2019-06-14 DIAGNOSIS — I480 Paroxysmal atrial fibrillation: Principal | ICD-10-CM | POA: Insufficient documentation

## 2019-06-14 HISTORY — PX: CARDIOVERSION: SHX1299

## 2019-06-14 LAB — POCT I-STAT, CHEM 8
BUN: 12 mg/dL (ref 6–20)
Calcium, Ion: 1.18 mmol/L (ref 1.15–1.40)
Chloride: 102 mmol/L (ref 98–111)
Creatinine, Ser: 0.8 mg/dL (ref 0.44–1.00)
Glucose, Bld: 130 mg/dL — ABNORMAL HIGH (ref 70–99)
HCT: 37 % (ref 36.0–46.0)
Hemoglobin: 12.6 g/dL (ref 12.0–15.0)
Potassium: 4.7 mmol/L (ref 3.5–5.1)
Sodium: 138 mmol/L (ref 135–145)
TCO2: 26 mmol/L (ref 22–32)

## 2019-06-14 LAB — POCT INR: INR: 2.8 (ref 2.0–3.0)

## 2019-06-14 SURGERY — CARDIOVERSION
Anesthesia: General

## 2019-06-14 MED ORDER — LIDOCAINE 2% (20 MG/ML) 5 ML SYRINGE
INTRAMUSCULAR | Status: DC | PRN
  Administered 2019-06-14: 40 mg via INTRAVENOUS

## 2019-06-14 MED ORDER — AMLODIPINE BESYLATE 5 MG PO TABS: 5.0000 mg | ORAL_TABLET | Freq: Every day | ORAL | Status: DC

## 2019-06-14 MED ORDER — ACETAMINOPHEN 325 MG PO TABS: 650.0000 mg | ORAL_TABLET | ORAL | Status: DC | PRN

## 2019-06-14 MED ORDER — PROPOFOL 10 MG/ML IV BOLUS
INTRAVENOUS | Status: DC | PRN
  Administered 2019-06-14: 20 mg via INTRAVENOUS
  Administered 2019-06-14: 50 mg via INTRAVENOUS

## 2019-06-14 MED ORDER — ATORVASTATIN CALCIUM 80 MG PO TABS
80.0000 mg | ORAL_TABLET | Freq: Every day | ORAL | Status: DC
  Administered 2019-06-14: 80 mg via ORAL
  Filled 2019-06-14: qty 1

## 2019-06-14 MED ORDER — DOPAMINE-DEXTROSE 3.2-5 MG/ML-% IV SOLN
5.0000 ug/kg/min | INTRAVENOUS | Status: DC
  Administered 2019-06-14: 5 ug/kg/min via INTRAVENOUS

## 2019-06-14 MED ORDER — SODIUM CHLORIDE 0.9 % IV SOLN
INTRAVENOUS | Status: AC | PRN
  Administered 2019-06-14: 500 mL via INTRAMUSCULAR

## 2019-06-14 MED ORDER — WARFARIN SODIUM 2.5 MG PO TABS
2.5000 mg | ORAL_TABLET | Freq: Once | ORAL | Status: AC
  Administered 2019-06-14: 2.5 mg via ORAL
  Filled 2019-06-14: qty 1

## 2019-06-14 MED ORDER — ATROPINE SULFATE 0.4 MG/ML IJ SOLN
INTRAMUSCULAR | Status: DC | PRN
  Administered 2019-06-14 (×2): 0.4 mg via INTRAVENOUS

## 2019-06-14 MED ORDER — AMIODARONE HCL 200 MG PO TABS
200.0000 mg | ORAL_TABLET | Freq: Every day | ORAL | Status: DC
  Administered 2019-06-15: 200 mg via ORAL
  Filled 2019-06-14: qty 1

## 2019-06-14 MED ORDER — WARFARIN - PHARMACIST DOSING INPATIENT
Freq: Every day | Status: DC
  Administered 2019-06-14: 18:00:00

## 2019-06-14 MED ORDER — EPHEDRINE SULFATE-NACL 50-0.9 MG/10ML-% IV SOSY
PREFILLED_SYRINGE | INTRAVENOUS | Status: DC | PRN
  Administered 2019-06-14: 10 mg via INTRAVENOUS

## 2019-06-14 MED ORDER — ONDANSETRON HCL 4 MG/2ML IJ SOLN: 4.0000 mg | Freq: Four times a day (QID) | INTRAMUSCULAR | Status: DC | PRN

## 2019-06-14 MED ORDER — DOPAMINE-DEXTROSE 3.2-5 MG/ML-% IV SOLN
INTRAVENOUS | Status: DC | PRN
  Administered 2019-06-14: 5 ug/kg/min via INTRAVENOUS

## 2019-06-14 NOTE — H&P (Signed)
   INTERVAL PROCEDURE H&P  History and Physical Interval Note:  06/14/2019 10:40 AM  Ashley [name] has presented today for their planned procedure. The various methods of treatment have been discussed with the patient and family. After consideration of risks, benefits and other options for treatment, the patient has consented to the procedure.  The patients' outpatient history has been reviewed, patient examined, and no change in status from most recent office note within the past 30 days. I have reviewed the patients' chart and labs and will proceed as planned. Questions were answered to the patient's satisfaction.   [name], MD, [facility], [place] Director of the Advanced Lipid Disorders &  Cardiovascular Risk Reduction Clinic Diplomate of the American Board of Clinical Lipidology Attending Cardiologist  Direct Dial: [phone]  Fax: [phone]  Website:  [name] 06/14/2019, 10:40 AM

## 2019-06-14 NOTE — Addendum Note (Signed)
Addendum  created 06/14/19 1201 by Janene Harvey, CRNA   Intraprocedure Event deleted, Intraprocedure Event edited

## 2019-06-14 NOTE — CV Procedure (Signed)
   CARDIOVERSION NOTE  Procedure: Electrical Cardioversion Indications:  Atrial Fibrillation  Procedure Details:  Consent: Risks of procedure as well as the alternatives and risks of each were explained to the (patient/caregiver).  Consent for procedure obtained.  Time Out: Verified patient identification, verified procedure, site/side was marked, verified correct patient position, special equipment/implants available, medications/allergies/relevent history reviewed, required imaging and test results available.  Performed  Patient placed on cardiac monitor, pulse oximetry, supplemental oxygen as necessary.  Sedation given: propofol per anesthesia Pacer pads placed anterior and posterior chest.  Cardioverted 1 time(s).  Cardioverted at 200J biphasic.  Impression: Ultimately successful DCCV to sinus bradycardia, following events below:  After cardioversion, the patient had about a 10 second period of asystole, followed by a ventricular escape rhythm in the 20's - then a junctional bradycardia in the 30's. The patient was given 0.4 mg atropine and another 0.4 mg. Dopamine was initiated at 5 mcg/kg/min. Due to hypotension, the patient was also given ephedrine by anesthesia. Eventually as she awakened, she developed a marked sinus bradycardia in the 40-50's. Case was discussed at bedside with Dr. Lovena Le who recommended overnight evaluation.  Plan: 1. Admit to observation to [place] -continue dopamine and wean as HR/BP tolerates. 2. Hold coreg, but continue po amiodarone. 3. Will ask Dr. Caryl Comes to see on rounds tomorrow since he is her cardiac electrophysiologist and is rounding this weekend.  Time Spent Directly with the Patient:  70 minutes   [name], MD, [facility], [place] Director of the Advanced Lipid Disorders &  Cardiovascular Risk Reduction Clinic Diplomate of the American Board of Clinical Lipidology Attending Cardiologist  Direct Dial:  [phone]  Fax: [phone]  Website:  [email or web address] 06/14/2019, 11:28 AM

## 2019-06-14 NOTE — Anesthesia Procedure Notes (Signed)
Date/Time: 06/14/2019 10:49 AM Performed by: Janene Harvey, CRNA Pre-anesthesia Checklist: Patient being monitored, Suction available, Emergency Drugs available and Patient identified Patient Re-evaluated:Patient Re-evaluated prior to induction Oxygen Delivery Method: Ambu bag Dental Injury: Teeth and Oropharynx as per pre-operative assessment

## 2019-06-14 NOTE — Progress Notes (Signed)
ANTICOAGULATION CONSULT NOTE - Initial Consult  Pharmacy Consult for warfarin Indication: atrial fibrillation  Allergies  Allergen Reactions  . Azithromycin Nausea Only  . Latex Rash    rash  . Spinach Hives    Patient Measurements: Height: 5\' 8"  (172.7 cm) Weight: 202 lb (91.6 kg) IBW/kg (Calculated) : 63.9  Vital Signs: Temp: 97 F (36.1 C) (10/23 0859) Temp Source: Temporal (10/23 0859) BP: 147/72 (10/23 1330) Pulse Rate: 66 (10/23 1330)  Labs: Recent Labs    06/14/19 0830 06/14/19 0928  HGB  --  12.6  HCT  --  37.0  INR 2.8  --   CREATININE  --  0.80    Estimated Creatinine Clearance: 95.2 mL/min (by C-G formula based on SCr of 0.8 mg/dL).   Medical History: Past Medical History:  Diagnosis Date  . Abdominal pain    ? chronic cholecystitis; admxn to [facility] 9/12 (CT, USN, HIDA done)  . Atrial fibrillation ([place])   . Cardiomyopathy    echo 9/12: mild LVH, EF 35-40%, mod MR (difficult to judge /post leaflet restricted), mod BAE, mod RVE, mod TR   . Cardiomyopathy ([place])    a. echo 9/12: mild LVH, EF 35-40%, mod MR (difficult to judge /post leaflet restricted), mod BAE, mod RVE, mod TR. b. Echo 11/2011: mild LVH, EF 55-60%, s/p MV repair w/o sig MR/MS, mildly dilated RV/RA, PASP 35mmHg. // AFL dx 10/2018 - Echo 10/2018: EF 30, diff HK, s/p MV repair with trivial MR, mean MV 7 but not c/w significant mitral stenosis, mild RAE, severe LAE, mod reduced RVSF   . Carotid artery disease ([place])    a. Duplex 06/2015: 50% RECA, 1-39% BICA.  [name] Coronary artery disease    a. s/p CABG 10/2011 - LIMA-LAD, SVG-PDA, SVG-OM, SVG-diagonal.  . Dyslipidemia   . Essential hypertension   . Hemifacial spasm   . History of Doppler ultrasound    a. Carotid US [facility] in [place], [name]) 7/17: no hemodynamically significant ICA stenosis  . Mitral regurgitation    a. s/p MV repair 10/2011 - on Coumadin for 3 months afterwards then d/c'd by surgery.  . Recurrent upper respiratory  infection (URI)   . 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 Stroke [facility])    a. age 53 - in setting of cigs and OCPs. //  b. s/p acute L parietal CVA >> tPA - [facility] in [place], [name]  . Tobacco abuse      Assessment: 53 yoF admitted for DCCV c/b bradycardia. Pharmacy to continue warfarin for AFib. INR 2.8 today, last dose yesterday. Home dose = 2.5mg  Sun/Wed/Fri; 5mg  all other days   Goal of Therapy:  INR 2-3 Monitor platelets by anticoagulation protocol: Yes   Plan:  -Warfarin 2.5mg  PO x1 -Daily INR  Arrie Senate, PharmD, BCPS Clinical Pharmacist [phone] Please check AMION for all [place] numbers 06/14/2019

## 2019-06-14 NOTE — Progress Notes (Signed)
Pt has intermittent episodes of bradycardia and missed beats. Has had 2 second pauses with some slow afib beats then back to SB/NSR HR 40-70 asymptomatic sitting in bed.   Currently sustained SB HR 38-44. BP 119/70, alert and oriented. Pt walked to to bathroom, steady on feet, no dizziness. Will continue to monitor.

## 2019-06-14 NOTE — H&P (Signed)
ADMISSION HISTORY & PHYSICAL  Patient Name: Ashley [name] Date of Encounter: 06/14/2019 Primary Care Physician: [name], MD Cardiologist: Dorris Carnes, MD  Chief Complaint   Afib, hypotension  Patient Profile   53 yo female with recurrent afib and history of bradycardia, recently changed to amiodarone who underwent DCCV today, complicated by asystole and marked bradycardia, requiring inotropes  HPI   This is a 53 y.o. female with a past medical history significant for recurrent atrial fibrillation, history of cardiomyopathy with LVEF around 30%, CAD s/p CABG x 4 in 2013 (LIMA to LAD, SVG to PDA, SVG to OM and SVG to Diagonal), who presented for elective cardioversion. She has has prior cardioversions, in fact, I performed a prior procedure on her in 01/2019. She apparently has had bradycardia and given a concern for this her diltiazem was stopped by Dr. Caryl Comes, however, she was on amiodarone and coreg. After cardioversion today, she was noted to have prolonged asystolic with hypotension, then developing a ventricular escape, junctional rhythm and marked sinus bradycardia. She did require atropine, ephedrine and dopamine to stabilize. I discussed the case with Dr. Lovena Le who recommended she be monitored overnight for observation.  PMHx   Past Medical History:  Diagnosis Date   Abdominal pain    ? chronic cholecystitis; admxn to [facility] 9/12 (CT, USN, HIDA done)   Atrial fibrillation ([place])    Cardiomyopathy    echo 9/12: mild LVH, EF 35-40%, mod MR (difficult to judge /post leaflet restricted), mod BAE, mod RVE, mod TR    Cardiomyopathy ([place])    a. echo 9/12: mild LVH, EF 35-40%, mod MR (difficult to judge /post leaflet restricted), mod BAE, mod RVE, mod TR. b. Echo 11/2011: mild LVH, EF 55-60%, s/p MV repair w/o sig MR/MS, mildly dilated RV/RA, PASP 85mmHg. // AFL dx 10/2018 - Echo 10/2018: EF 30, diff HK, s/p MV repair with trivial MR, mean MV 7 but not c/w significant  mitral stenosis, mild RAE, severe LAE, mod reduced RVSF    Carotid artery disease ([place])    a. Duplex 06/2015: 50% RECA, 1-39% BICA.   Coronary artery disease    a. s/p CABG 10/2011 - LIMA-LAD, SVG-PDA, SVG-OM, SVG-diagonal.   Dyslipidemia    Essential hypertension    Hemifacial spasm    History of Doppler ultrasound    a. Carotid US [facility] in [place], [name]) 7/17: no hemodynamically significant ICA stenosis   Mitral regurgitation    a. s/p MV repair 10/2011 - on Coumadin for 3 months afterwards then d/c'd by surgery.   Recurrent upper respiratory infection (URI)    S/P CABG x 4 10/21/2011   S/P mitral valve repair 10/21/2011   Stroke [facility])    a. age 24 - in setting of cigs and OCPs. //  b. s/p acute L parietal CVA >> tPA - [facility] in [place], [name]   Tobacco abuse     Past Surgical History:  Procedure Laterality Date   CARDIAC CATHETERIZATION     CARDIOVERSION N/A 01/25/2019   Procedure: CARDIOVERSION;  Surgeon: [name], MD;  Location: [facility] ENDOSCOPY;  Service: Cardiovascular;  Laterality: N/A;   CESAREAN SECTION     cesarean section     CORONARY ARTERY BYPASS GRAFT  10/21/2011   Procedure: CORONARY ARTERY BYPASS GRAFTING (CABG);  Surgeon: [name], MD;  Location: [place];  Service: Open Heart Surgery;  Laterality: N/A;  cabg x four, using right leg greater saphenous vein  harvested endoscopically   DENTAL SURGERY     MITRAL VALVE REPAIR  10/21/2011   Procedure: MITRAL VALVE REPAIR (MVR);  Surgeon: [name], MD;  Location: [place];  Service: Open Heart Surgery;  Laterality: N/A;   TEE WITHOUT CARDIOVERSION  06/30/2011   Procedure: TRANSESOPHAGEAL ECHOCARDIOGRAM (TEE);  Surgeon: [name], MD;  Location: [facility] ENDOSCOPY;  Service: Cardiovascular;  Laterality: N/A;   TONSILLECTOMY     TUBAL LIGATION      FAMHx   Family History  Problem Relation Age of Onset   Hypertension Mother    Stroke Mother    Heart disease Father     COPD Father    Anesthesia problems Neg Hx    Hypotension Neg Hx    Malignant hyperthermia Neg Hx    Pseudochol deficiency Neg Hx     SOCHx    reports that she quit smoking about 8 years ago. She has a 30.00 pack-year smoking history. She has never used smokeless tobacco. She reports current alcohol use of about 1.0 standard drinks of alcohol per week. She reports that she does not use drugs.  Outpatient Medications   No current facility-administered medications on file prior to encounter.    Current Outpatient Medications on File Prior to Encounter  Medication Sig Dispense Refill   amiodarone (PACERONE) 200 MG tablet Take 1 tablet (200 mg total) by mouth daily. 90 tablet 3   atorvastatin (LIPITOR) 80 MG tablet Take 1 tablet (80 mg total) by mouth daily at 6 PM. 90 tablet 3   carvedilol (COREG) 25 MG tablet Take 1.5 tablets (37.5 mg total) by mouth 2 (two) times daily. 270 tablet 3   cromolyn (NASALCROM) 5.2 MG/ACT nasal spray Place 1 spray into both nostrils as needed for rhinitis.     diphenhydrAMINE (BENADRYL) 25 MG tablet Take 25 mg by mouth every 6 (six) hours as needed for allergies.     famotidine (PEPCID) 20 MG tablet Take 20 mg by mouth daily as needed. For indigestion     furosemide (LASIX) 20 MG tablet TAKE 1 TABLET BY MOUTH AS NEEDED FOR  SWELLING (Patient taking differently: Take 20 mg by mouth daily as needed for fluid or edema. ) 90 tablet 2   losartan (COZAAR) 25 MG tablet Take 1 tablet (25 mg total) by mouth daily. 90 tablet 3   Saline 0.2 % SOLN Place into the nose.     vitamin B-12 (CYANOCOBALAMIN) 1000 MCG tablet Take 1 tablet (1,000 mcg total) by mouth daily.     vitamin C (ASCORBIC ACID) 500 MG tablet Take 1,000 mg by mouth 2 (two) times daily.      warfarin (COUMADIN) 5 MG tablet Take 2 tablets (10 mg total) by mouth daily. Take 10 mg evening of 9/14 and 9/15. Further per coumadin clinic (Patient taking differently: Take 2.5-5 mg by mouth See admin  instructions. Take 2.5 mg  Sunday, Wednesday, and Friday Take 5 mg all the other day) 30 tablet 1    Inpatient Medications    Scheduled Meds:   Continuous Infusions:  DOPamine 5 mcg/kg/min (06/14/19 1113)    PRN Meds:    ALLERGIES   Allergies  Allergen Reactions   Azithromycin Nausea Only   Latex Rash    rash   Spinach Hives    ROS   Pertinent items noted in HPI and remainder of comprehensive ROS otherwise negative.  Vitals   Vitals:   06/14/19 1115 06/14/19 1120 06/14/19 1125 06/14/19 1130  BP: Ashley [name])  143/56 (!) 121/57 117/67 (!) 164/90  Pulse: (!) 45 (!) 46 69 72  Resp: 18 17 18 18   Temp:      TempSrc:      SpO2: 99% 99% 94% 94%  Weight:      Height:        Intake/Output Summary (Last 24 hours) at 06/14/2019 1146 Last data filed at 06/14/2019 1109 Gross per 24 hour  Intake 100 ml  Output --  Net 100 ml   Filed Weights   06/14/19 0859  Weight: 91.6 kg    Physical Exam   General appearance: alert and no distress Neck: no carotid bruit, no JVD and thyroid not enlarged, symmetric, no tenderness/mass/nodules Lungs: clear to auscultation bilaterally Heart: regular bradycarda Abdomen: soft, non-tender; bowel sounds normal; no masses,  no organomegaly and obese Extremities: extremities normal, atraumatic, no cyanosis or edema Pulses: 2+ and symmetric Skin: pale cool, dry Neurologic: Mental status: Alert, oriented, thought content appropriate Psych: pleasant  Labs   Results for orders placed or performed during the hospital encounter of 06/14/19 (from the past 48 hour(s))  I-STAT, chem 8     Status: Abnormal   Collection Time: 06/14/19  9:28 AM  Result Value Ref Range   Sodium 138 135 - 145 mmol/L   Potassium 4.7 3.5 - 5.1 mmol/L   Chloride 102 98 - 111 mmol/L   BUN 12 6 - 20 mg/dL   Creatinine, Ser 0.80 0.44 - 1.00 mg/dL   Glucose, Bld 130 (H) 70 - 99 mg/dL   Calcium, Ion 1.18 1.15 - 1.40 mmol/L   TCO2 26 22 - 32 mmol/L   Hemoglobin 12.6  12.0 - 15.0 g/dL   HCT 37.0 36.0 - 46.0 %    ECG   Marked sinus bradycardia at 38 - Personally Reviewed  Telemetry   As above - marked bradycardia, alternating sinus and junctional - Personally Reviewed  Radiology   No results found.  Cardiac Studies   N/A  Assessment   Principal Problem:   Postprocedural cardiogenic shock, initial encounter ([place]) Active Problems:   CAD (coronary artery disease)   S/P mitral valve repair   S/P CABG x 4   Cardiomyopathy ([place])   A-fib ([place])   Plan   1. Ashley [name] had significant hypotension and bradycardia post-DCCV requiring inotrope therapy. After discussion with Dr. Lovena Le (EP), he recommends overnight observation. Will ask Dr. Caryl Comes to evaluate in the am tomorrow. Will hold rate-controlling medications today and may resume in the am tomorrow. Can we dopamine as tolerated if BP elevated over [id number] systolic and/or HR stays over 50 in sinus. Anticipate O/N observation and d/c home tomorrow if rhythm is stable.  Time Spent Directly with Patient:  I have spent a total of 70 minutes with patient reviewing hospital notes, telemetry, EKGs, labs and examining the patient as well as establishing an assessment and plan that was discussed with the patient.  > 50% of time was spent in direct patient care.   Length of Stay:  LOS: 0 days   [name], MD, [facility], [place] Director of the [facility] Lipid Disorders &  Cardiovascular Risk Reduction Clinic Diplomate of the American Board of Clinical Lipidology Attending Cardiologist  Direct Dial: [phone]   Fax: [phone]  Website:  [email or web address] Alejo Beamer 06/14/2019, 11:46 AM

## 2019-06-14 NOTE — Transfer of Care (Signed)
Immediate Anesthesia Transfer of Care Note  Patient: [place]  Procedure(s) Performed: CARDIOVERSION (N/A )  Patient Location: Endoscopy Unit  Anesthesia Type:General  Level of Consciousness: awake  Airway & Oxygen Therapy: Patient Spontanous Breathing and Patient connected to nasal cannula oxygen  Post-op Assessment: Report given to RN and Post -op Vital signs reviewed and stable  Post vital signs: Reviewed  Last Vitals:  Vitals Value Taken Time  BP    Temp    Pulse    Resp    SpO2      Last Pain:  Vitals:   06/14/19 0859  TempSrc: Temporal  PainSc: 0-No pain         Complications: No apparent anesthesia complications

## 2019-06-14 NOTE — Anesthesia Preprocedure Evaluation (Addendum)
Anesthesia Evaluation  Patient identified by MRN, date of birth, ID band Patient awake    Reviewed: Allergy & Precautions, NPO status , Patient's Chart, lab work & pertinent test results, reviewed documented beta blocker date and time   Airway Mallampati: II  TM Distance: >3 FB Neck ROM: Full    Dental  (+) Edentulous Upper, Edentulous Lower   Pulmonary Recent URI , Resolved, former smoker,    Pulmonary exam normal breath sounds clear to auscultation       Cardiovascular hypertension, Pt. on medications and Pt. on home beta blockers + CAD and + CABG  + dysrhythmias Atrial Fibrillation  Rhythm:Irregular Rate:Normal  CABG x 4v. 2013 LVEF - 50-55% most recent echo 2020    Neuro/Psych CVA, No Residual Symptoms    GI/Hepatic negative GI ROS, Neg liver ROS,   Endo/Other  Hyperlipidemia  Renal/GU negative Renal ROS  negative genitourinary   Musculoskeletal negative musculoskeletal ROS (+)   Abdominal (+) + obese,   Peds  Hematology Warfarin therapy- last dose last pm   Anesthesia Other Findings   Reproductive/Obstetrics                            Anesthesia Physical Anesthesia Plan  ASA: III  Anesthesia Plan: General   Post-op Pain Management:    Induction: Intravenous  PONV Risk Score and Plan: 3 and Ondansetron and Treatment may vary due to age or medical condition  Airway Management Planned: Mask  Additional Equipment:   Intra-op Plan:   Post-operative Plan:   Informed Consent: I have reviewed the patients History and Physical, chart, labs and discussed the procedure including the risks, benefits and alternatives for the proposed anesthesia with the patient or authorized representative who has indicated his/her understanding and acceptance.       Plan Discussed with: Surgeon and CRNA  Anesthesia Plan Comments:         Anesthesia Quick Evaluation

## 2019-06-14 NOTE — Anesthesia Postprocedure Evaluation (Signed)
Anesthesia Post Note  Patient: Ashley [name]  Procedure(s) Performed: CARDIOVERSION (N/A )     Patient location during evaluation: Endoscopy Anesthesia Type: General Level of consciousness: awake and alert Pain management: pain level controlled Vital Signs Assessment: post-procedure vital signs reviewed and stable Respiratory status: spontaneous breathing, nonlabored ventilation and respiratory function stable Cardiovascular status: blood pressure returned to baseline and stable Postop Assessment: no apparent nausea or vomiting Anesthetic complications: no    Last Vitals:  Vitals:   06/14/19 1115 06/14/19 1120  BP: (!) 143/56 (!) 121/57  Pulse: (!) 45 (!) 46  Resp: 18 17  Temp:    SpO2: 99% 99%    Last Pain:  Vitals:   06/14/19 1113  TempSrc:   PainSc: 0-No pain                 Amela Handley A.

## 2019-06-14 NOTE — Patient Instructions (Signed)
Description   Continue same dosage 1 tablet daily except for 1/2 a tablet on Sundays, Wednesdays and Fridays.  Recheck INR 1 week post cardioversion. Call Coumadin Clinic [phone] if you have any changes in your medication or upcoming procedures. Pt stated that she is waiting on financial advisor to review some things before she bring paper work for [place] back.

## 2019-06-15 ENCOUNTER — Other Ambulatory Visit: Payer: Self-pay | Admitting: Medical

## 2019-06-15 DIAGNOSIS — Z9889 Other specified postprocedural states: Secondary | ICD-10-CM

## 2019-06-15 DIAGNOSIS — I480 Paroxysmal atrial fibrillation: Principal | ICD-10-CM

## 2019-06-15 DIAGNOSIS — I2510 Atherosclerotic heart disease of native coronary artery without angina pectoris: Secondary | ICD-10-CM

## 2019-06-15 DIAGNOSIS — R001 Bradycardia, unspecified: Secondary | ICD-10-CM

## 2019-06-15 DIAGNOSIS — I10 Essential (primary) hypertension: Secondary | ICD-10-CM

## 2019-06-15 DIAGNOSIS — E7800 Pure hypercholesterolemia, unspecified: Secondary | ICD-10-CM

## 2019-06-15 LAB — BASIC METABOLIC PANEL
Anion gap: 11 (ref 5–15)
BUN: 12 mg/dL (ref 6–20)
CO2: 23 mmol/L (ref 22–32)
Calcium: 8.9 mg/dL (ref 8.9–10.3)
Chloride: 104 mmol/L (ref 98–111)
Creatinine, Ser: 1.02 mg/dL — ABNORMAL HIGH (ref 0.44–1.00)
GFR calc Af Amer: >60 mL/min (ref >60)
GFR calc non Af Amer: >60 mL/min (ref >60)
Glucose, Bld: 99 mg/dL (ref 70–99)
Potassium: 4 mmol/L (ref 3.5–5.1)
Sodium: 138 mmol/L (ref 135–145)

## 2019-06-15 LAB — PROTIME-INR
INR: 2.4 — ABNORMAL HIGH (ref 0.8–1.2)
Prothrombin Time: 25.9 seconds — ABNORMAL HIGH (ref 11.4–15.2)

## 2019-06-15 MED ORDER — WARFARIN SODIUM 2.5 MG PO TABS: 2.5000 mg | ORAL_TABLET | ORAL | Status: DC

## 2019-06-15 MED ORDER — WARFARIN SODIUM 5 MG PO TABS: 5.0000 mg | ORAL_TABLET | ORAL | Status: DC

## 2019-06-15 MED ORDER — LOSARTAN POTASSIUM 50 MG PO TABS: 25.0000 mg | ORAL_TABLET | Freq: Every day | ORAL | 3 refills | Status: DC

## 2019-06-15 MED ORDER — WARFARIN SODIUM 5 MG PO TABS: 2.5000 mg | ORAL_TABLET | ORAL | Status: DC

## 2019-06-15 MED ORDER — LOSARTAN POTASSIUM 25 MG PO TABS
25.0000 mg | ORAL_TABLET | Freq: Every day | ORAL | Status: DC
  Administered 2019-06-15: 25 mg via ORAL
  Filled 2019-06-15: qty 1

## 2019-06-15 NOTE — Progress Notes (Signed)
Removed PIV access and pt received discharge instructions. Pt understood it well. HS [employer]

## 2019-06-15 NOTE — Progress Notes (Addendum)
Progress Note  Patient Name: Ashley [name] Date of Encounter: 06/15/2019  Primary Cardiologist: Dorris Carnes, MD   Subjective   Denies any chest pain or SOB.  S/P DCCV yesterday complicated by asystole and marked bradycardia requiring inotropes  Inpatient Medications    Scheduled Meds: . amiodarone  200 mg Oral Daily  . amLODipine  5 mg Oral Daily  . atorvastatin  80 mg Oral q1800  . Warfarin - Pharmacist Dosing Inpatient   Does not apply q1800   Continuous Infusions: . DOPamine Stopped (06/14/19 1210)   PRN Meds: acetaminophen, ondansetron (ZOFRAN) IV   Vital Signs    Vitals:   06/14/19 1940 06/14/19 2317 06/15/19 0300 06/15/19 0653  BP: (!) 116/94 (!) 118/94 (!) 123/91 (!) 120/58  Pulse: (!) 44 69 65 62  Resp: 15 19 18 20   Temp: (!) 97.4 F (36.3 C) 98.1 F (36.7 C) 97.8 F (36.6 C) 97.8 F (36.6 C)  TempSrc: Axillary Oral Oral Oral  SpO2: 100% 97% 97% 96%  Weight:      Height:        Intake/Output Summary (Last 24 hours) at 06/15/2019 0803 Last data filed at 06/15/2019 0600 Gross per 24 hour  Intake 580 ml  Output 150 ml  Net 430 ml   Filed Weights   06/14/19 0859 06/14/19 1349  Weight: 91.6 kg 91.5 kg    Telemetry    NSR with occasional mild pauses - Personally Reviewed  ECG    No new EKG to review - Personally Reviewed  Physical Exam   GEN: No acute distress.   Neck: No JVD Cardiac: RRR, no murmurs, rubs, or gallops.  Respiratory: Clear to auscultation bilaterally. GI: Soft, nontender, non-distended  MS: No edema; No deformity. Neuro:  Nonfocal  Psych: Normal affect   Labs    Chemistry Recent Labs  Lab 06/14/19 0928 06/15/19 0232  NA 138 138  K 4.7 4.0  CL 102 104  CO2  --  23  GLUCOSE 130* 99  BUN 12 12  CREATININE 0.80 1.02*  CALCIUM  --  8.9  GFRNONAA  --  >60  GFRAA  --  >60  ANIONGAP  --  11     Hematology Recent Labs  Lab 06/14/19 0928  HGB 12.6  HCT 37.0    Cardiac EnzymesNo results for  input(s): TROPONINI in the last 168 hours. No results for input(s): TROPIPOC in the last 168 hours.   BNPNo results for input(s): BNP, PROBNP in the last 168 hours.   DDimer No results for input(s): DDIMER in the last 168 hours.   Radiology    No results found.  Cardiac Studies   none  Patient Profile     53 y.o. female with a past medical history significant for recurrent atrial fibrillation, history of cardiomyopathy with LVEF around 30%, CAD s/p CABG x 4 in 2013 (LIMA to LAD, SVG to PDA, SVG to OM and SVG to Diagonal), who presented for elective cardioversion.  She apparently has had bradycardia and given a concern for this her diltiazem was stopped by Dr. Caryl Comes, however, she was on amiodarone and coreg. After cardioversion yesterday, she was noted to have prolonged asystolic with hypotension, then developing a ventricular escape, junctional rhythm and marked sinus bradycardia. She did require atropine, ephedrine and dopamine to stabilize  Assessment & Plan    1.  Asystole and profound bradycardia -occurred after DCCV -has had problems with bradycardia in the past and Cardizem recently stopped.  Also  was on carvedilol and Amio -now off pressors -discussed with Dr. Caryl Comes and will d/c home on Amio 200mg  daily -will get AT monitor on Monday at our office -followup with Dr. Caryl Comes in 7-10 days  2.  PAF -s/p DCCV yesterday with profound bradycardia requiring pressor support -in NSR today on tele -will discuss further AADT with EP as she was on Amio -continue warfarin - INR 2.4 this am  3.  ASCAD  -s/p remote CABG -denies any anginal sx -continue statin  -no ASA due to warfarin  4. HTN -BP stable -stop amlodipine   -add Losartan 25mg  daily  5.  HLD -continue statin  6.  Severe MR -s/p MV repair  7.  DCM -EF 30-35% -BB on hold for now due to bradycardia post DCCV -stop amlodipine -start Losartan 25mg  daily  Patient is stable this am with NSR on tele and stable VS.   Ok for discharge home today with early followup in office with Dr. Caryl Comes  For questions or updates, please contact [facility] Please consult [email or web address] for contact info under Cardiology/STEMI.      Signed, Fransico Him, MD  06/15/2019, 8:03 AM

## 2019-06-15 NOTE — Progress Notes (Signed)
Patient had 3.2 sec pause at 0902. Notified PA Roby Lofts this matter and patient had 4 more pause before this episode. Continue to monitor. HS [employer]

## 2019-06-15 NOTE — Discharge Summary (Signed)
Discharge Summary    Patient ID: [place] MRN: [id number]; DOB: [birth year]  Admit date: 06/14/2019 Discharge date: 06/15/2019  Primary Care Provider: [name], MD  Primary Cardiologist: Dorris Carnes, MD  Primary Electrophysiologist:  Virl Axe, MD   Discharge Diagnoses    Principal Problem:   Postprocedural cardiogenic shock, initial encounter [facility]) Active Problems:   CAD (coronary artery disease)   S/P mitral valve repair   S/P CABG x 4   Cardiomyopathy ([place])   A-fib ([place])   Allergies Allergies  Allergen Reactions  . Azithromycin Nausea Only  . Latex Rash    rash  . Spinach Hives    Diagnostic Studies/Procedures    DCCV 06/14/2019: Impression: Ultimately successful DCCV to sinus bradycardia, following events below:  After cardioversion, the patient had about a 10 second period of asystole, followed by a ventricular escape rhythm in the 20's - then a junctional bradycardia in the 30's. The patient was given 0.4 mg atropine and another 0.4 mg. Dopamine was initiated at 5 mcg/kg/min. Due to hypotension, the patient was also given ephedrine by anesthesia. Eventually as she awakened, she developed a marked sinus bradycardia in the 40-50's. Case was discussed at bedside with Dr. Lovena Le who recommended overnight evaluation.  Plan: 1. Admit to observation to [place] -continue dopamine and wean as HR/BP tolerates. 2. Hold coreg, but continue po amiodarone. 3. Will ask Dr. Caryl Comes to see on rounds tomorrow since he is her cardiac electrophysiologist and is rounding this weekend. _____________   History of Present Illness     [place] is a 53 y.o. female with a past medical history significant for recurrent atrial fibrillation, history of cardiomyopathy with LVEF around 30%, CAD s/p CABG x 4 in 2013 (LIMA to LAD, SVG to PDA, SVG to OM and SVG to Diagonal), who presented for elective cardioversion. She has has prior cardioversions in 01/2019. She  apparently has had bradycardia and given a concern for this her diltiazem was stopped by Dr. Caryl Comes, however, she was on amiodarone and coreg. After cardioversion 06/14/2019, she was noted to have prolonged asystolic with hypotension, then developing a ventricular escape, junctional rhythm and marked sinus bradycardia. She did require atropine, ephedrine and dopamine to stabilize. Case discussed with Dr. Lovena Le who recommended she be monitored overnight for observation.  Hospital Course     Consultants: None   1. Asystole and profound bradycardia: patient presented for outpatient DCCV 06/14/2019. She had successful conversion to sinus bradycardia, however cardioversion was complicated by 10 second period of asystole, followed by a ventricular escape rhythm int he 20s, then a junctional bradycardia in the 30s-40s. She was given 0.4mg  atropine x2, dopamine was initiated, and she was given ephedrine by anesthesia. Upon waking, she had marked sinus bradycardia with HR in the 40s-50s. Her carvedilol was discontinued and her HR improved to the 60s, maintaining sinus rhythm with occasional brief pauses overnight and pressors were weaned off. Case discussed with Dr. Caryl Comes who recommended discharge home on amiodarone 200mg  daily and close outpatient monitor. Will plan for outpatient 14 day live-monitor zio patch.  - Continue amiodarone 200mg  daily - Zio patch ordered. Message sent to Mack Guise to help arrange   2.  Paroxysmal atrial fibrillation: maintaining sinus rhythm after DCCV yesterday c/b profound bradycardia requiring pressor support - Continue amiodarone 200mg  daily - Continue warfarin - INR 2.4 on the day of discharge  3.  CAD s/p remote CABG: no anginal complaints this admission. Not on ASA due to need for  anticoagulation for #2 - Continue statin   4. HTN: BP stable this admission. Amlodipine stopped due to low EF.  - Losartan increased to 50mg  daily  5.  HLD - Continue statin  6.   Severe MR - s/p MV repair  7.  DCM: EF 30-35%. Carvedilol discontinued given bradycardia. Amlodipine also discontinued this admission.  - Continue Losartan 50mg  daily  Did the patient have an acute coronary syndrome (MI, NSTEMI, STEMI, etc) this admission?:  No                               Did the patient have a percutaneous coronary intervention (stent / angioplasty)?:  No.   _____________  Discharge Vitals Blood pressure (!) 120/58, pulse 62, temperature 97.8 F (36.6 C), temperature source Oral, resp. rate (!) 24, height 5\' 6"  (1.676 m), weight 91.5 kg, last menstrual period 05/18/2015, SpO2 96 %.  Filed Weights   06/14/19 0859 06/14/19 1349  Weight: 91.6 kg 91.5 kg    Labs & Radiologic Studies    CBC Recent Labs    06/14/19 0928  HGB 12.6  HCT 99991111   Basic Metabolic Panel Recent Labs    06/14/19 0928 06/15/19 0232  NA 138 138  K 4.7 4.0  CL 102 104  CO2  --  23  GLUCOSE 130* 99  BUN 12 12  CREATININE 0.80 1.02*  CALCIUM  --  8.9   Liver Function Tests No results for input(s): AST, ALT, ALKPHOS, BILITOT, PROT, ALBUMIN in the last 72 hours. No results for input(s): LIPASE, AMYLASE in the last 72 hours. High Sensitivity Troponin:   No results for input(s): TROPONINIHS in the last 720 hours.  BNP Invalid input(s): POCBNP D-Dimer No results for input(s): DDIMER in the last 72 hours. Hemoglobin A1C No results for input(s): HGBA1C in the last 72 hours. Fasting Lipid Panel No results for input(s): CHOL, HDL, LDLCALC, TRIG, CHOLHDL, LDLDIRECT in the last 72 hours. Thyroid Function Tests No results for input(s): TSH, T4TOTAL, T3FREE, THYROIDAB in the last 72 hours.  Invalid input(s): FREET3 _____________  No results found. Disposition   Patient was seen and examined by Dr. Radford Pax who deemed patient as stable for discharge. Follow-up has been arranged. Discharge medications as listed below.    Follow-up Plans & Appointments    Follow-up Information     [name], MD Follow up.   Specialty: Cardiology Why: Our schedules will reach out to you to arrange close follow-up with Dr. Caryl Comes for within 7-10 days after discharge. If you do not hear from them by 10/28, please contact our office to follow-up.  Contact information: [id number] [name] [address] [phone]            Discharge Medications   Allergies as of 06/15/2019      Reactions   Azithromycin Nausea Only   Latex Rash   rash   Spinach Hives      Medication List    STOP taking these medications   amLODipine 5 MG tablet Commonly known as: NORVASC   carvedilol 25 MG tablet Commonly known as: COREG     TAKE these medications   amiodarone 200 MG tablet Commonly known as: PACERONE Take 1 tablet (200 mg total) by mouth daily.   atorvastatin 80 MG tablet Commonly known as: LIPITOR Take 1 tablet (80 mg total) by mouth daily at 6 PM.   cromolyn 5.2 MG/ACT  nasal spray Commonly known as: NASALCROM Place 1 spray into both nostrils as needed for rhinitis.   diphenhydrAMINE 25 MG tablet Commonly known as: BENADRYL Take 25 mg by mouth every 6 (six) hours as needed for allergies.   famotidine 20 MG tablet Commonly known as: PEPCID Take 20 mg by mouth daily as needed. For indigestion   furosemide 20 MG tablet Commonly known as: LASIX TAKE 1 TABLET BY MOUTH AS NEEDED FOR  SWELLING What changed: See the new instructions.   losartan 50 MG tablet Commonly known as: COZAAR Take 0.5 tablets (25 mg total) by mouth daily. What changed: medication strength   Saline 0.2 % Soln Place into the nose.   vitamin B-12 1000 MCG tablet Commonly known as: CYANOCOBALAMIN Take 1 tablet (1,000 mcg total) by mouth daily.   vitamin C 500 MG tablet Commonly known as: ASCORBIC ACID Take 1,000 mg by mouth 2 (two) times daily.   warfarin 5 MG tablet Commonly known as: COUMADIN Take as directed. If you are unsure how to take this medication, talk  to your nurse or doctor. Original instructions: Take 2 tablets (10 mg total) by mouth daily. Take 10 mg evening of 9/14 and 9/15. Further per coumadin clinic What changed:   how much to take  when to take this  additional instructions          Outstanding Labs/Studies   Zio patch monitor ordered at discharge. To be arranged outpatient.   Duration of Discharge Encounter   Greater than 30 minutes including physician time.  Signed, Abigail Butts, PA-C 06/15/2019, 9:59 AM

## 2019-06-15 NOTE — Discharge Instructions (Signed)
MEDICATION CHANGES: - STOP carvedilol (coreg) - this medication was stopped due to a low heart rate - STOP amlodipine (norvasc) - this medication is not ideal in patients with a weak heart muscle - INCREASE losartan to 50mg  daily to help control your blood pressure since we are stopping amlodipine. _______________________________________________________________  Ashley [name] have been recommended to undergo outpatient cardiac monitoring after discharge. A heart monitor was ordered. Our office will contact you to arrange this testing. Please call [phone] if any questions.

## 2019-06-15 NOTE — Progress Notes (Signed)
[facility] for warfarin Indication: atrial fibrillation  Allergies  Allergen Reactions  . Azithromycin Nausea Only  . Latex Rash    rash  . Spinach Hives    Patient Measurements: Height: 5\' 6"  (167.6 cm) Weight: 201 lb 11.5 oz (91.5 kg) IBW/kg (Calculated) : 59.3  Vital Signs: Temp: 97.8 F (36.6 C) (10/24 0653) Temp Source: Oral (10/24 0653) BP: 120/58 (10/24 0653) Pulse Rate: 62 (10/24 0653)  Labs: Recent Labs    06/14/19 0830 06/14/19 0928 06/15/19 0232  HGB  --  12.6  --   HCT  --  37.0  --   LABPROT  --   --  25.9*  INR 2.8  --  2.4*  CREATININE  --  0.80 1.02*    Estimated Creatinine Clearance: 71.9 mL/min (A) (by C-G formula based on SCr of 1.02 mg/dL (H)). Assessment: 21 yoF admitted for DCCV c/b bradycardia. Pharmacy to continue warfarin for AFib. Last dose yesterday. Home dose = 2.5mg  Sun/Wed/Fri; 5mg  all other days  INR therapeutic today   Goal of Therapy:  INR 2-3 Monitor platelets by anticoagulation protocol: Yes   Plan:  -Warfarin 5 mg po x 1 dose at 1800 pm -Daily INR  Planning home today -- continue home warfarin regimen  Thank you Anette Guarneri, PharmD Please check AMION for all [facility] numbers 06/15/2019

## 2019-06-16 ENCOUNTER — Encounter (HOSPITAL_COMMUNITY): Payer: Self-pay | Admitting: Internal Medicine

## 2019-06-19 ENCOUNTER — Telehealth: Payer: Self-pay

## 2019-06-19 ENCOUNTER — Ambulatory Visit (INDEPENDENT_AMBULATORY_CARE_PROVIDER_SITE_OTHER): Payer: Self-pay | Admitting: *Deleted

## 2019-06-19 ENCOUNTER — Ambulatory Visit (INDEPENDENT_AMBULATORY_CARE_PROVIDER_SITE_OTHER): Payer: Self-pay | Admitting: Physician Assistant

## 2019-06-19 ENCOUNTER — Encounter: Payer: Self-pay | Admitting: Physician Assistant

## 2019-06-19 ENCOUNTER — Other Ambulatory Visit: Payer: Self-pay

## 2019-06-19 VITALS — BP 138/88 | HR 70 | Ht 66.0 in | Wt 201.0 lb

## 2019-06-19 DIAGNOSIS — Z5181 Encounter for therapeutic drug level monitoring: Secondary | ICD-10-CM

## 2019-06-19 DIAGNOSIS — Z79899 Other long term (current) drug therapy: Secondary | ICD-10-CM

## 2019-06-19 DIAGNOSIS — Z8673 Personal history of transient ischemic attack (TIA), and cerebral infarction without residual deficits: Secondary | ICD-10-CM

## 2019-06-19 DIAGNOSIS — I4891 Unspecified atrial fibrillation: Secondary | ICD-10-CM

## 2019-06-19 DIAGNOSIS — I2510 Atherosclerotic heart disease of native coronary artery without angina pectoris: Secondary | ICD-10-CM

## 2019-06-19 DIAGNOSIS — I480 Paroxysmal atrial fibrillation: Secondary | ICD-10-CM

## 2019-06-19 DIAGNOSIS — I255 Ischemic cardiomyopathy: Secondary | ICD-10-CM

## 2019-06-19 DIAGNOSIS — Z9889 Other specified postprocedural states: Secondary | ICD-10-CM

## 2019-06-19 DIAGNOSIS — I10 Essential (primary) hypertension: Secondary | ICD-10-CM

## 2019-06-19 LAB — POCT INR: INR: 2.1 (ref 2.0–3.0)

## 2019-06-19 NOTE — Telephone Encounter (Signed)
Verified pt's info during her visit today for her monitor. She will call [employer] to get an exact price when she gets home then she will put the monitor on at that time. Pt is self-pay!  14 day ZIO AT was given to pt from the office.

## 2019-06-19 NOTE — Progress Notes (Signed)
Cardiology Office Note Date:  06/19/2019  Patient ID:  [name], DOB 18-Nov-1965, MRN [id number] PCP:  [name], MD  Cardiologist:  Dr. Harrington Challenger Electrophysiologist: Dr. Caryl Comes     Chief Complaint: TOC, post hospital  History of Present Illness: [name] is a 53 y.o. female with history of CAD, VHD, (CABG, MV repair, 2013), ICM, HTN, HLD, stroke (at 53 y/o in setting of tobacco use and OBC) another stoke 2017, paroxysmal AFib.  The patient comes in today to be seen for Dr. Caryl Comes.  She was admitted to [facility] 06/14/2019.  She went for DCCV after which she had 10 seconds of asystole >> junctional rhythm in the 20's.  She was treated with atropine and ephedrine required dopamine for hypotension >> SB 40's-50's. She was observed overnight, weaned off pressor.  Notes report SR, HR 60's In d/w Dr. Caryl Comes, home cored stopped, amiodarone continued.  Discharged and planned to have Zio AT placed.  Her amlodipine was stopped and started on ARB given her CM.   She is doing well.  Saturday she felt more tired then usual, has a fleeting dizzy spell, maybe 2 seconds.  Since then has felt well.  No CP, palpitations or SOB.  She does not exercise, but denies any difficulties with her ADLs, no symptoms of orthopnea or PND. She has never fainted. No bleeding or signs of bleeding, she sees the coumadin clinic today for her INR She is scheduled for PFTs in Dec.  She is not particularly symptomatic with her AFib, she doesn't think she can tell the difference   AFib Hx AFib/flutter 10/2018, RVR felt to contribute to CM AAD Tikosyn 03/2019 >> required dose reduction with QT prolongation >> recurrent AFib >> stopped Amiodarone 04/2019 >> current     Past Medical History:  Diagnosis Date  . Abdominal pain    ? chronic cholecystitis; admxn to [facility] 9/12 (CT, USN, HIDA done)  . Atrial fibrillation ([place])   . Cardiomyopathy    echo 9/12: mild LVH, EF 35-40%, mod MR (difficult to  judge /post leaflet restricted), mod BAE, mod RVE, mod TR   . Cardiomyopathy ([place])    a. echo 9/12: mild LVH, EF 35-40%, mod MR (difficult to judge /post leaflet restricted), mod BAE, mod RVE, mod TR. b. Echo 11/2011: mild LVH, EF 55-60%, s/p MV repair w/o sig MR/MS, mildly dilated RV/RA, PASP 10mmHg. // AFL dx 10/2018 - Echo 10/2018: EF 30, diff HK, s/p MV repair with trivial MR, mean MV 7 but not c/w significant mitral stenosis, mild RAE, severe LAE, mod reduced RVSF   . Carotid artery disease ([place])    a. Duplex 06/2015: 50% RECA, 1-39% BICA.  [name] Coronary artery disease    a. s/p CABG 10/2011 - LIMA-LAD, SVG-PDA, SVG-OM, SVG-diagonal.  . Dyslipidemia   . Essential hypertension   . Hemifacial spasm   . History of Doppler ultrasound    a. Carotid US [facility] in [place], [name]) 7/17: no hemodynamically significant ICA stenosis  . Mitral regurgitation    a. s/p MV repair 10/2011 - on Coumadin for 3 months afterwards then d/c'd by surgery.  . Recurrent upper respiratory infection (URI)   . 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 Stroke [facility])    a. age 13 - in setting of cigs and OCPs. //  b. s/p acute L parietal CVA >> tPA - [facility] in [place], [name]  . Tobacco  abuse     Past Surgical History:  Procedure Laterality Date  . CARDIAC CATHETERIZATION    . CARDIOVERSION N/A 01/25/2019   Procedure: CARDIOVERSION;  Surgeon: [name], MD;  Location[facility] ENDOSCOPY;  Service: Cardiovascular;  Laterality: N/A;  . CARDIOVERSION N/A 06/14/2019   Procedure: CARDIOVERSION;  Surgeon: [name], MD;  Location: [facility];  Service: Cardiovascular;  Laterality: N/A;  . CESAREAN SECTION    . cesarean section    . CORONARY ARTERY BYPASS GRAFT  10/21/2011   Procedure: CORONARY ARTERY BYPASS GRAFTING (CABG);  Surgeon: [name], MD;  Location: [facility];  Service: Open Heart Surgery;  Laterality: N/A;  cabg x four, using right leg greater saphenous vein  harvested endoscopically  . DENTAL SURGERY    . MITRAL VALVE REPAIR  10/21/2011   Procedure: MITRAL VALVE REPAIR (MVR);  Surgeon: [name], MD;  Location: [facility];  Service: Open Heart Surgery;  Laterality: N/A;  . TEE WITHOUT CARDIOVERSION  06/30/2011   Procedure: TRANSESOPHAGEAL ECHOCARDIOGRAM (TEE);  Surgeon: [name], MD;  Location[facility] ENDOSCOPY;  Service: Cardiovascular;  Laterality: N/A;  . TONSILLECTOMY    . TUBAL LIGATION      Current Outpatient Medications  Medication Sig Dispense Refill  . amiodarone (PACERONE) 200 MG tablet Take 1 tablet (200 mg total) by mouth daily. 90 tablet 3  . atorvastatin (LIPITOR) 80 MG tablet Take 1 tablet (80 mg total) by mouth daily at 6 PM. 90 tablet 3  . cromolyn (NASALCROM) 5.2 MG/ACT nasal spray Place 1 spray into both nostrils as needed for rhinitis.    [name] diphenhydrAMINE (BENADRYL) 25 MG tablet Take 25 mg by mouth every 6 (six) hours as needed for allergies.    . famotidine (PEPCID) 20 MG tablet Take 20 mg by mouth daily as needed. For indigestion    . furosemide (LASIX) 20 MG tablet TAKE 1 TABLET BY MOUTH AS NEEDED FOR  SWELLING 90 tablet 2  . losartan (COZAAR) 50 MG tablet Take 0.5 tablets (25 mg total) by mouth daily. 90 tablet 3  . Saline 0.2 % SOLN Place into the nose.    . vitamin B-12 (CYANOCOBALAMIN) 1000 MCG tablet Take 1 tablet (1,000 mcg total) by mouth daily.    . vitamin C (ASCORBIC ACID) 500 MG tablet Take 1,000 mg by mouth 2 (two) times daily.     [name] warfarin (COUMADIN) 5 MG tablet Take 0.5-1 tablets (2.5-5 mg total) by mouth See admin instructions. Take 2.5 mg  Sunday, Wednesday, and Friday Take 5 mg all the other day     Current Facility-Administered Medications  Medication Dose Route Frequency Provider Last Rate Last Dose  . incobotulinumtoxinA (XEOMIN) 50 units injection 50 Units  50 Units Intramuscular Q90 days Marcial Pacas, MD   50 Units at 11/30/16 1546  . incobotulinumtoxinA (XEOMIN) 50 units injection 50 Units  50  Units Intramuscular Q90 days Marcial Pacas, MD   50 Units at 07/25/17 [phone]  . incobotulinumtoxinA (XEOMIN) 50 units injection 50 Units  50 Units Intramuscular Q90 days Marcial Pacas, MD   50 Units at 07/26/17 1217    Allergies:   Azithromycin, Latex, and Spinach   Social History:  The patient  reports that she quit smoking about 8 years ago. She has a 30.00 pack-year smoking history. She has never used smokeless tobacco. She reports current alcohol use of about 1.0 standard drinks of alcohol per week. She reports that she does not use drugs.   Family  History:  The patient's family history includes COPD in her father; Heart disease in her father; Hypertension in her mother; Stroke in her mother.  ROS:  Please see the history of present illness.  All other systems are reviewed and otherwise negative.   PHYSICAL EXAM:  VS:  BP 138/88   Pulse 70   Ht 5\' 6"  (1.676 m)   Wt 201 lb (91.2 kg)   LMP 05/18/2015 Comment: went for 3 week after skipping two months   SpO2 99%   BMI 32.44 kg/m  BMI: Body mass index is 32.44 kg/m. Well nourished, well developed, in no acute distress  HEENT: normocephalic, atraumatic  Neck: no JVD, carotid bruits or masses Cardiac:  RRR; no significant murmurs, no rubs, or gallops Lungs:  CTA b/l, no wheezing, rhonchi or rales  Abd: soft, nontender MS: no deformity or atrophy Ext:  no edema  Skin: warm and dry, no rash Neuro:  No gross deficits appreciated Psych: euthymic mood, full affect     EKG:  Done today and reviewed by myself shows SR, 70bpm, borderline 1st degree AVblock, ICRBBB,     03/05/2019: TTE IMPRESSIONS  1. The left ventricle has normal systolic function, with an ejection fraction of 60-65%. The cavity size was normal.  2. The right ventricle has normal systolc function. The cavity was normal. There is no increase in right ventricular wall thickness.  3. Aortic valve regurgitation was not assessed by color flow Doppler.  4. Pulmonic valve  regurgitation was not assessed by color flow Doppler.    11/01/2018: LVEF 30% IMPRESSIONS 1. The left ventricle has a visually estimated ejection fraction of 30%. The cavity size was normal. Left ventricular diastolic Doppler parameters are indeterminate. Left ventricular diffuse hypokinesis.  2. Status post mitral valve repair. Trivial mitral regurgitation. Mean gradient across mitral valve 7 mmHg but PHT short at 71 msec so doubt significant mitral stenosis.  3. The aortic valve is tricuspid Mild calcification of the aortic valve. no stenosis of the aortic valve.  4. The aortic root and ascending aorta are normal in size and structure.  5. Right atrial size was mildly dilated.  6. Left atrial size was severely dilated.  7. The inferior vena cava was dilated in size with >50% respiratory variability. PA systolic pressure 35 mmHg.  8. The right ventricle has moderately reduced systolic function. The cavity was normal. There is no increase in right ventricular wall thickness.    11/21/11: LVEF 55-60%    Recent Labs: 01/21/2019: Platelets 187 05/05/2019: Magnesium 2.1 05/15/2019: ALT 25 06/05/2019: TSH 2.490 06/14/2019: Hemoglobin 12.6 06/15/2019: BUN 12; Creatinine, Ser 1.02; Potassium 4.0; Sodium 138  No results found for requested labs within last 8760 hours.   Estimated Creatinine Clearance: 71.8 mL/min (A) (by C-G formula based on SCr of 1.02 mg/dL (H)).   Wt Readings from Last 3 Encounters:  06/19/19 201 lb (91.2 kg)  06/14/19 201 lb 11.5 oz (91.5 kg)  06/05/19 198 lb (89.8 kg)     Other studies reviewed: Additional studies/records reviewed today include: summarized above  ASSESSMENT AND PLAN:  1. Paroxysmal AFib     CHA2DS2Vasc is 26 (6 with female) on warfarin, monitored and managed by our coumadin clinic     She has failed Tikosyn previously  S/p DCCV resulting in asystole/junctional bradycardia and hypotension requiring pressors transiently June 2020 DCCV >  junctional bradycardia > dilt and dig stopped Coreg stopped, remains on amiodarone  No brady symptoms, she has never fainted Zio AT  placed today TSH, LFTs today, she is scheduled for PFTs in Dec  2. CAD     No anginal symptoms     Off BB with brady event, on lipitor     C/w Dr.Ross  3. VHD s/pMVRepair     No symptoms of progressive VHD     No significant murmuir on exam, echo is up to date  4. ICM     Last echo noted recovered LVEF     No symptoms or exam findings to suggest fluid OL     Off BB as noted above, on ARB, diuretic  5. HTN     She brings some home BP readings 140-160/70-80     Today's is better     No changes, she is asked to keep a daily BP log, will have her see BP clinic in a couple weeks bringing her home readings and cuff with her  Pt is self pay, I discussed BP clinic, return visits, monitoring, etc. With her, she is OK with the plan of care, visits, testing  This was to have been a TOC visit, though I don't see that she got her post hospital phone call.     Disposition: F/u with Dr. Marny Lowenstein in Jan, sooner if needed.  Current medicines are reviewed at length with the patient today.  The patient did not have any concerns regarding medicines.  Venetia Night, PA-C 06/19/2019 2:19 PM     [address] [place] [address] [phone] (office)  [phone] (fax)

## 2019-06-19 NOTE — Telephone Encounter (Signed)
Pt called [employer] when she got home. The monitor that was given to her in the office had expired. She is sending that one back and they will overnight a new one to her.   [id number] is the SN for the monitor she was given from the office.

## 2019-06-19 NOTE — Patient Instructions (Addendum)
/  Medication Instructions:   Your physician recommends that you continue on your current medications as directed. Please refer to the Current Medication list given to you today.  *If you need a refill on your cardiac medications before your next appointment, please call your pharmacy*  Lab Work:  You will have labs drawn today: TSH and LFTs  If you have labs (blood work) drawn today and your tests are completely normal, you will receive your results only by: [name] MyChart Message (if you have MyChart) OR . A paper copy in the mail If you have any lab test that is abnormal or we need to change your treatment, we will call you to review the results.  Testing/Procedures:  None ordered today  Follow-Up:  2 weeks with the hypertension clinic  09/18/2019 at 10:45 with Virl Axe, MD

## 2019-06-19 NOTE — Patient Instructions (Signed)
Description   Continue same dosage 1 tablet daily except for 1/2 a tablet on Sundays, Wednesdays and Fridays. Call Coumadin Clinic [phone] if you have any changes in your medication or upcoming procedures. Pt stated that she is waiting on financial advisor to review some things before she bring paper work for Sal[place] back.

## 2019-06-20 ENCOUNTER — Encounter: Payer: Self-pay | Admitting: Internal Medicine

## 2019-06-20 ENCOUNTER — Ambulatory Visit (INDEPENDENT_AMBULATORY_CARE_PROVIDER_SITE_OTHER): Payer: Self-pay

## 2019-06-20 DIAGNOSIS — R001 Bradycardia, unspecified: Secondary | ICD-10-CM

## 2019-06-20 LAB — TSH: TSH: 2.65 u[IU]/mL (ref 0.450–4.500)

## 2019-06-20 LAB — HEPATIC FUNCTION PANEL
ALT: 16 IU/L (ref 0–32)
AST: 17 IU/L (ref 0–40)
Albumin: 4.5 g/dL (ref 3.8–4.9)
Alkaline Phosphatase: 212 IU/L — ABNORMAL HIGH (ref 39–117)
Bilirubin Total: 1.8 mg/dL — ABNORMAL HIGH (ref 0.0–1.2)
Bilirubin, Direct: 0.43 mg/dL — ABNORMAL HIGH (ref 0.00–0.40)
Total Protein: 7.1 g/dL (ref 6.0–8.5)

## 2019-06-21 NOTE — Addendum Note (Signed)
Addended by: Mady Haagensen on: 06/21/2019 07:59 AM   Modules accepted: Orders

## 2019-06-22 ENCOUNTER — Telehealth: Payer: Self-pay | Admitting: Student

## 2019-06-22 NOTE — Telephone Encounter (Signed)
   Received page from [facility] about critical EKG reading. Called and spoke with [facility] tech who reported that patient had 2 pauses this afternoon between 12:30pm and 1pm. One pause lasted 5.1 seconds and the second pause lasted 6.5 seconds. Underlying rhythm reportedly sinus (possibly junctional). When in clear sinus, heart rates in the 60's. She was also noted to be have PACs. Asked that these strips be faxed to [place].   Called and spoke with patient. Patient states she is feeling fine and denies any symptoms including lightheaded, dizziness, syncope, chest pain, and shortness of breath. I reviewed patient with Dr. Lovena Le who stated that if patient was asymptomatic, OK to continue Amiodarone and continue with Zio. Explained this to patient. However, I did emphasize that if patient starts having any symptoms at all, she needs to be evaluated in the ED. Patient voiced understanding and agreed.  Will route note to Dr. Caryl Comes so he is aware.  Darreld Mclean, PA-C 06/22/2019 3:06 PM

## 2019-06-24 NOTE — Telephone Encounter (Signed)
She may need pacing

## 2019-06-25 ENCOUNTER — Ambulatory Visit (HOSPITAL_COMMUNITY)
Admission: EM | Admit: 2019-06-25 | Discharge: 2019-06-26 | Disposition: A | Discharge status: Home / Self Care | Payer: Self-pay | Attending: Internal Medicine | Admitting: Internal Medicine

## 2019-06-25 ENCOUNTER — Encounter (HOSPITAL_COMMUNITY): Payer: Self-pay | Admitting: Emergency Medicine

## 2019-06-25 ENCOUNTER — Telehealth: Payer: Self-pay | Admitting: *Deleted

## 2019-06-25 ENCOUNTER — Ambulatory Visit: Payer: Self-pay | Attending: Family Medicine | Admitting: Family Medicine

## 2019-06-25 ENCOUNTER — Other Ambulatory Visit: Payer: Self-pay

## 2019-06-25 ENCOUNTER — Encounter (HOSPITAL_COMMUNITY)
Admission: EM | Disposition: A | Discharge status: Home / Self Care | Payer: Self-pay | Source: Home / Self Care | Attending: Emergency Medicine

## 2019-06-25 ENCOUNTER — Encounter: Payer: Self-pay | Admitting: Family Medicine

## 2019-06-25 VITALS — BP 142/60 | HR 36 | Temp 98.2°F | Ht 68.0 in | Wt 199.0 lb

## 2019-06-25 DIAGNOSIS — I10 Essential (primary) hypertension: Secondary | ICD-10-CM | POA: Insufficient documentation

## 2019-06-25 DIAGNOSIS — R001 Bradycardia, unspecified: Secondary | ICD-10-CM

## 2019-06-25 DIAGNOSIS — Z1211 Encounter for screening for malignant neoplasm of colon: Secondary | ICD-10-CM

## 2019-06-25 DIAGNOSIS — E785 Hyperlipidemia, unspecified: Secondary | ICD-10-CM | POA: Insufficient documentation

## 2019-06-25 DIAGNOSIS — Z959 Presence of cardiac and vascular implant and graft, unspecified: Secondary | ICD-10-CM

## 2019-06-25 DIAGNOSIS — I455 Other specified heart block: Secondary | ICD-10-CM

## 2019-06-25 DIAGNOSIS — I495 Sick sinus syndrome: Secondary | ICD-10-CM

## 2019-06-25 DIAGNOSIS — I480 Paroxysmal atrial fibrillation: Secondary | ICD-10-CM | POA: Insufficient documentation

## 2019-06-25 DIAGNOSIS — Z1231 Encounter for screening mammogram for malignant neoplasm of breast: Secondary | ICD-10-CM

## 2019-06-25 DIAGNOSIS — I2510 Atherosclerotic heart disease of native coronary artery without angina pectoris: Secondary | ICD-10-CM | POA: Insufficient documentation

## 2019-06-25 DIAGNOSIS — Z01419 Encounter for gynecological examination (general) (routine) without abnormal findings: Secondary | ICD-10-CM

## 2019-06-25 DIAGNOSIS — Z124 Encounter for screening for malignant neoplasm of cervix: Secondary | ICD-10-CM

## 2019-06-25 DIAGNOSIS — Z1159 Encounter for screening for other viral diseases: Secondary | ICD-10-CM

## 2019-06-25 DIAGNOSIS — Z0000 Encounter for general adult medical examination without abnormal findings: Secondary | ICD-10-CM

## 2019-06-25 DIAGNOSIS — Z20828 Contact with and (suspected) exposure to other viral communicable diseases: Secondary | ICD-10-CM | POA: Insufficient documentation

## 2019-06-25 HISTORY — PX: PACEMAKER IMPLANT: EP1218

## 2019-06-25 LAB — BASIC METABOLIC PANEL
Anion gap: 14 (ref 5–15)
BUN: 14 mg/dL (ref 6–20)
CO2: 22 mmol/L (ref 22–32)
Calcium: 9.6 mg/dL (ref 8.9–10.3)
Chloride: 102 mmol/L (ref 98–111)
Creatinine, Ser: 1.01 mg/dL — ABNORMAL HIGH (ref 0.44–1.00)
GFR calc Af Amer: >60 mL/min (ref >60)
GFR calc non Af Amer: >60 mL/min (ref >60)
Glucose, Bld: 151 mg/dL — ABNORMAL HIGH (ref 70–99)
Potassium: 4.9 mmol/L (ref 3.5–5.1)
Sodium: 138 mmol/L (ref 135–145)

## 2019-06-25 LAB — CBC WITH DIFFERENTIAL/PLATELET
Abs Immature Granulocytes: 0.02 10*3/uL (ref 0.00–0.07)
Basophils Absolute: 0.1 10*3/uL (ref 0.0–0.1)
Basophils Relative: 1 %
Eosinophils Absolute: 0.2 10*3/uL (ref 0.0–0.5)
Eosinophils Relative: 3 %
HCT: 42.8 % (ref 36.0–46.0)
Hemoglobin: 13.9 g/dL (ref 12.0–15.0)
Immature Granulocytes: 0 %
Lymphocytes Relative: 16 %
Lymphs Abs: 1 10*3/uL (ref 0.7–4.0)
MCH: 32.5 pg (ref 26.0–34.0)
MCHC: 32.5 g/dL (ref 30.0–36.0)
MCV: 100 fL (ref 80.0–100.0)
Monocytes Absolute: 0.7 10*3/uL (ref 0.1–1.0)
Monocytes Relative: 11 %
Neutro Abs: 4.4 10*3/uL (ref 1.7–7.7)
Neutrophils Relative %: 69 %
Platelets: 259 10*3/uL (ref 150–400)
RBC: 4.28 MIL/uL (ref 3.87–5.11)
RDW: 16.3 % — ABNORMAL HIGH (ref 11.5–15.5)
WBC: 6.3 10*3/uL (ref 4.0–10.5)
nRBC: 0 % (ref 0.0–0.2)

## 2019-06-25 LAB — PROTIME-INR
INR: 2 — ABNORMAL HIGH (ref 0.8–1.2)
Prothrombin Time: 22.7 seconds — ABNORMAL HIGH (ref 11.4–15.2)

## 2019-06-25 LAB — MAGNESIUM: Magnesium: 2.3 mg/dL (ref 1.7–2.4)

## 2019-06-25 LAB — TSH: TSH: 1.832 u[IU]/mL (ref 0.350–4.500)

## 2019-06-25 LAB — SARS CORONAVIRUS 2 BY RT PCR (HOSPITAL ORDER, PERFORMED IN ~~LOC~~ HOSPITAL LAB): SARS Coronavirus 2: NEGATIVE

## 2019-06-25 SURGERY — PACEMAKER IMPLANT

## 2019-06-25 MED ORDER — LIDOCAINE HCL (PF) 1 % IJ SOLN
INTRAMUSCULAR | Status: AC
  Filled 2019-06-25: qty 60

## 2019-06-25 MED ORDER — LIDOCAINE HCL (PF) 1 % IJ SOLN
INTRAMUSCULAR | Status: DC | PRN
  Administered 2019-06-25: 45 mL

## 2019-06-25 MED ORDER — AMIODARONE HCL 200 MG PO TABS
200.0000 mg | ORAL_TABLET | Freq: Every day | ORAL | Status: DC
  Administered 2019-06-26: 200 mg via ORAL
  Filled 2019-06-25: qty 1

## 2019-06-25 MED ORDER — SODIUM CHLORIDE 0.9% FLUSH: 3.0000 mL | INTRAVENOUS | Status: DC | PRN

## 2019-06-25 MED ORDER — LOSARTAN POTASSIUM 25 MG PO TABS
25.0000 mg | ORAL_TABLET | Freq: Every day | ORAL | Status: DC
  Administered 2019-06-26: 09:00:00 25 mg via ORAL
  Filled 2019-06-25 (×2): qty 1

## 2019-06-25 MED ORDER — SODIUM CHLORIDE 0.9% FLUSH
3.0000 mL | Freq: Two times a day (BID) | INTRAVENOUS | Status: DC
  Administered 2019-06-25 – 2019-06-26 (×2): 3 mL via INTRAVENOUS

## 2019-06-25 MED ORDER — FENTANYL CITRATE (PF) 100 MCG/2ML IJ SOLN
INTRAMUSCULAR | Status: AC
  Filled 2019-06-25: qty 2

## 2019-06-25 MED ORDER — HEPARIN (PORCINE) IN NACL 1000-0.9 UT/500ML-% IV SOLN
INTRAVENOUS | Status: DC | PRN
  Administered 2019-06-25: 500 mL

## 2019-06-25 MED ORDER — WARFARIN - PHYSICIAN DOSING INPATIENT
Freq: Every day | Status: DC
  Administered 2019-06-25: 19:00:00

## 2019-06-25 MED ORDER — ONDANSETRON HCL 4 MG/2ML IJ SOLN: 4.0000 mg | Freq: Four times a day (QID) | INTRAMUSCULAR | Status: DC | PRN

## 2019-06-25 MED ORDER — CEFAZOLIN SODIUM-DEXTROSE 2-4 GM/100ML-% IV SOLN
2.0000 g | INTRAVENOUS | Status: AC
  Administered 2019-06-25: 2 g via INTRAVENOUS

## 2019-06-25 MED ORDER — HYDRALAZINE HCL 20 MG/ML IJ SOLN
INTRAMUSCULAR | Status: AC
  Filled 2019-06-25: qty 1

## 2019-06-25 MED ORDER — SODIUM CHLORIDE 0.9 % IV SOLN
80.0000 mg | INTRAVENOUS | Status: AC
  Administered 2019-06-25: 17:00:00 80 mg

## 2019-06-25 MED ORDER — SODIUM CHLORIDE 0.9 % IV SOLN
250.0000 mL | INTRAVENOUS | Status: DC | PRN
  Administered 2019-06-25 – 2019-06-26 (×2): 250 mL via INTRAVENOUS

## 2019-06-25 MED ORDER — CEFAZOLIN SODIUM-DEXTROSE 2-4 GM/100ML-% IV SOLN
INTRAVENOUS | Status: AC
  Filled 2019-06-25: qty 100

## 2019-06-25 MED ORDER — MIDAZOLAM HCL 5 MG/5ML IJ SOLN
INTRAMUSCULAR | Status: AC
  Filled 2019-06-25: qty 5

## 2019-06-25 MED ORDER — WARFARIN SODIUM 5 MG PO TABS
5.0000 mg | ORAL_TABLET | Freq: Once | ORAL | Status: AC
  Administered 2019-06-25: 5 mg via ORAL
  Filled 2019-06-25: qty 1

## 2019-06-25 MED ORDER — HYDROCODONE-ACETAMINOPHEN 5-325 MG PO TABS
1.0000 | ORAL_TABLET | ORAL | Status: DC | PRN
  Administered 2019-06-25: 20:00:00 1 via ORAL
  Administered 2019-06-26: 2 via ORAL
  Filled 2019-06-25: qty 1
  Filled 2019-06-25: qty 2

## 2019-06-25 MED ORDER — ACETAMINOPHEN 325 MG PO TABS
325.0000 mg | ORAL_TABLET | ORAL | Status: DC | PRN
  Administered 2019-06-25: 650 mg via ORAL
  Filled 2019-06-25: qty 2

## 2019-06-25 MED ORDER — AMLODIPINE BESYLATE 5 MG PO TABS
5.0000 mg | ORAL_TABLET | Freq: Every day | ORAL | Status: DC
  Administered 2019-06-25 – 2019-06-26 (×2): 5 mg via ORAL
  Filled 2019-06-25 (×2): qty 1

## 2019-06-25 MED ORDER — HYDRALAZINE HCL 20 MG/ML IJ SOLN
INTRAMUSCULAR | Status: DC | PRN
  Administered 2019-06-25: 10 mg via INTRAVENOUS

## 2019-06-25 MED ORDER — SODIUM CHLORIDE 0.9 % IV SOLN
INTRAVENOUS | Status: AC
  Filled 2019-06-25: qty 2

## 2019-06-25 MED ORDER — MIDAZOLAM HCL 5 MG/5ML IJ SOLN
INTRAMUSCULAR | Status: DC | PRN
  Administered 2019-06-25: 1 mg via INTRAVENOUS
  Administered 2019-06-25: 2 mg via INTRAVENOUS
  Administered 2019-06-25: 1 mg via INTRAVENOUS

## 2019-06-25 MED ORDER — SODIUM CHLORIDE 0.9 % IV SOLN: INTRAVENOUS | Status: DC

## 2019-06-25 MED ORDER — CEFAZOLIN SODIUM-DEXTROSE 1-4 GM/50ML-% IV SOLN
1.0000 g | Freq: Four times a day (QID) | INTRAVENOUS | Status: AC
  Administered 2019-06-25 – 2019-06-26 (×3): 1 g via INTRAVENOUS
  Filled 2019-06-25 (×3): qty 50

## 2019-06-25 MED ORDER — RIVAROXABAN 20 MG PO TABS: 20.0000 mg | ORAL_TABLET | Freq: Every day | ORAL | 3 refills | Status: DC

## 2019-06-25 MED ORDER — IOHEXOL 350 MG/ML SOLN
INTRAVENOUS | Status: DC | PRN
  Administered 2019-06-25: 15 mL

## 2019-06-25 SURGICAL SUPPLY — 7 items
CABLE SURGICAL S-101-97-12 (CABLE) ×2 IMPLANT
LEAD TENDRIL MRI 46CM LPA1200M (Lead) ×1 IMPLANT
LEAD TENDRIL MRI 58CM LPA1200M (Lead) ×1 IMPLANT
PACEMAKER ASSURITY DR-RF (Pacemaker) ×1 IMPLANT
PAD PRO RADIOLUCENT 2001M-C (PAD) ×2 IMPLANT
SHEATH 8FR PRELUDE SNAP 13 (SHEATH) ×2 IMPLANT
TRAY PACEMAKER INSERTION (PACKS) ×2 IMPLANT

## 2019-06-25 NOTE — Interval H&P Note (Signed)
History and Physical Interval Note:  06/25/2019 3:04 PM  [place]  has presented today for surgery, with the diagnosis of snd.  The various methods of treatment have been discussed with the patient and family. After consideration of risks, benefits and other options for treatment, the patient has consented to  Procedure(s): PACEMAKER IMPLANT (N/A) as a surgical intervention.  The patient's history has been reviewed, patient examined, no change in status, stable for surgery.  I have reviewed the patient's chart and labs.  Questions were answered to the patient's satisfaction.     [name]

## 2019-06-25 NOTE — Patient Instructions (Signed)
Health Maintenance, Female Adopting a healthy lifestyle and getting preventive care are important in promoting health and wellness. Ask your health care provider about:  The right schedule for you to have regular tests and exams.  Things you can do on your own to prevent diseases and keep yourself healthy. What should I know about diet, weight, and exercise? Eat a healthy diet   Eat a diet that includes plenty of vegetables, fruits, low-fat dairy products, and lean protein.  Do not eat a lot of foods that are high in solid fats, added sugars, or sodium. Maintain a healthy weight Body mass index (BMI) is used to identify weight problems. It estimates body fat based on height and weight. Your health care provider can help determine your BMI and help you achieve or maintain a healthy weight. Get regular exercise Get regular exercise. This is one of the most important things you can do for your health. Most adults should:  Exercise for at least 150 minutes each week. The exercise should increase your heart rate and make you sweat (moderate-intensity exercise).  Do strengthening exercises at least twice a week. This is in addition to the moderate-intensity exercise.  Spend less time sitting. Even light physical activity can be beneficial. Watch cholesterol and blood lipids Have your blood tested for lipids and cholesterol at 55 years of age, then have this test every 5 years. Have your cholesterol levels checked more often if:  Your lipid or cholesterol levels are high.  You are older than 55 years of age.  You are at high risk for heart disease. What should I know about cancer screening? Depending on your health history and family history, you may need to have cancer screening at various ages. This may include screening for:  Breast cancer.  Cervical cancer.  Colorectal cancer.  Skin cancer.  Lung cancer. What should I know about heart disease, diabetes, and high blood  pressure? Blood pressure and heart disease  High blood pressure causes heart disease and increases the risk of stroke. This is more likely to develop in people who have high blood pressure readings, are of African descent, or are overweight.  Have your blood pressure checked: ? Every 3-5 years if you are 18-39 years of age. ? Every year if you are 40 years old or older. Diabetes Have regular diabetes screenings. This checks your fasting blood sugar level. Have the screening done:  Once every three years after age 40 if you are at a normal weight and have a low risk for diabetes.  More often and at a younger age if you are overweight or have a high risk for diabetes. What should I know about preventing infection? Hepatitis B If you have a higher risk for hepatitis B, you should be screened for this virus. Talk with your health care provider to find out if you are at risk for hepatitis B infection. Hepatitis C Testing is recommended for:  Everyone born from 1945 through 1965.  Anyone with known risk factors for hepatitis C. Sexually transmitted infections (STIs)  Get screened for STIs, including gonorrhea and chlamydia, if: ? You are sexually active and are younger than 55 years of age. ? You are older than 55 years of age and your health care provider tells you that you are at risk for this type of infection. ? Your sexual activity has changed since you were last screened, and you are at increased risk for chlamydia or gonorrhea. Ask your health care provider if   you are at risk.  Ask your health care provider about whether you are at high risk for HIV. Your health care provider may recommend a prescription medicine to help prevent HIV infection. If you choose to take medicine to prevent HIV, you should first get tested for HIV. You should then be tested every 3 months for as long as you are taking the medicine. Pregnancy  If you are about to stop having your period (premenopausal) and  you may become pregnant, seek counseling before you get pregnant.  Take 400 to 800 micrograms (mcg) of folic acid every day if you become pregnant.  Ask for birth control (contraception) if you want to prevent pregnancy. Osteoporosis and menopause Osteoporosis is a disease in which the bones lose minerals and strength with aging. This can result in bone fractures. If you are 65 years old or older, or if you are at risk for osteoporosis and fractures, ask your health care provider if you should:  Be screened for bone loss.  Take a calcium or vitamin D supplement to lower your risk of fractures.  Be given hormone replacement therapy (HRT) to treat symptoms of menopause. Follow these instructions at home: Lifestyle  Do not use any products that contain nicotine or tobacco, such as cigarettes, e-cigarettes, and chewing tobacco. If you need help quitting, ask your health care provider.  Do not use street drugs.  Do not share needles.  Ask your health care provider for help if you need support or information about quitting drugs. Alcohol use  Do not drink alcohol if: ? Your health care provider tells you not to drink. ? You are pregnant, may be pregnant, or are planning to become pregnant.  If you drink alcohol: ? Limit how much you use to 0-1 drink a day. ? Limit intake if you are breastfeeding.  Be aware of how much alcohol is in your drink. In the [place] one drink equals one 12 oz bottle of beer (355 mL), one 5 oz glass of wine (148 mL), or one 1 oz glass of hard liquor (44 mL). General instructions  Schedule regular health, dental, and eye exams.  Stay current with your vaccines.  Tell your health care provider if: ? You often feel depressed. ? You have ever been abused or do not feel safe at home. Summary  Adopting a healthy lifestyle and getting preventive care are important in promoting health and wellness.  Follow your health care provider's instructions about healthy  diet, exercising, and getting tested or screened for diseases.  Follow your health care provider's instructions on monitoring your cholesterol and blood pressure. This information is not intended to replace advice given to you by your health care provider. Make sure you discuss any questions you have with your health care provider. Document Released: 02/21/2011 Document Revised: 08/01/2018 Document Reviewed: 08/01/2018 Elsevier Patient Education  2020 [employer]vier Inc.  

## 2019-06-25 NOTE — ED Provider Notes (Signed)
[facility] EMERGENCY DEPARTMENT Provider Note   CSN: [id number] Arrival date & time: 06/25/19  1212     History   Chief Complaint Chief Complaint  Patient presents with  . bradycardia    HPI [place] is a 53 y.o. female.     HPI    53 y.o. female with a past medical history significant for recurrent atrial fibrillation, cardiomyopathy with LVEF around 30%, CAD s/p CABG x 4 in 2013. Had cardioversion [id number] ago complicated by asystole with hypotension, then developing a ventricular escape, junctional rhythm and marked sinus bradycardia. She did require atropine, ephedrine and dopamine to stabilize. Her medications were adjusted. Since that time she had had continued recorded pauses up to 5-6 seconds on monitor although she has been asymtpomatic.  Again today she had 6.2 second pause recorded and sinus brady versus junctional rhythm in 20-30s. Again, she remains asymptomatic. HR currently sinus brady in high 30s/low 40s. BP fine. Reports compliance with her medications.     Past Medical History:  Diagnosis Date  . Abdominal pain    ? chronic cholecystitis; admxn to [facility] 9/12 (CT, USN, HIDA done)  . Atrial fibrillation ([place])   . Cardiomyopathy    echo 9/12: mild LVH, EF 35-40%, mod MR (difficult to judge /post leaflet restricted), mod BAE, mod RVE, mod TR   . Cardiomyopathy ([place])    a. echo 9/12: mild LVH, EF 35-40%, mod MR (difficult to judge /post leaflet restricted), mod BAE, mod RVE, mod TR. b. Echo 11/2011: mild LVH, EF 55-60%, s/p MV repair w/o sig MR/MS, mildly dilated RV/RA, PASP 44mmHg. // AFL dx 10/2018 - Echo 10/2018: EF 30, diff HK, s/p MV repair with trivial MR, mean MV 7 but not c/w significant mitral stenosis, mild RAE, severe LAE, mod reduced RVSF   . Carotid artery disease ([place])    a. Duplex 06/2015: 50% RECA, 1-39% BICA.  [name] Coronary artery disease    a. s/p CABG 10/2011 - LIMA-LAD, SVG-PDA, SVG-OM, SVG-diagonal.  . Dyslipidemia   .  Essential hypertension   . Hemifacial spasm   . History of Doppler ultrasound    a. Carotid US [facility] in [place], [name]) 7/17: no hemodynamically significant ICA stenosis  . Mitral regurgitation    a. s/p MV repair 10/2011 - on Coumadin for 3 months afterwards then d/c'd by surgery.  . Recurrent upper respiratory infection (URI)   . 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 Stroke [facility])    a. age 57 - in setting of cigs and OCPs. //  b. s/p acute L parietal CVA >> tPA - [facility] in [place], [name]  . Tobacco abuse     Patient Active Problem List   Diagnosis Date Noted  . A-fib ([place]) 06/14/2019  . Postprocedural cardiogenic shock, initial encounter ([place]) 06/14/2019  . Chronic combined systolic and diastolic heart failure ([place]) 06/05/2019  . Atrial fibrillation ([place]) 03/22/2019  . Encounter for therapeutic drug monitoring 03/22/2019  . Persistent atrial fibrillation ([place])   . History of stroke 01/01/2018  . Former smoker 07/03/2017  . B12 deficiency 06/21/2016  . Hemifacial spasm 04/13/2016  . Cerebrovascular accident (CVA) due to thrombosis of cerebral artery ([place])   . Cardiomyopathy ([place])   . Essential hypertension   . 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 Coronary artery disease 07/11/2011  . CAD (coronary artery disease) 06/21/2011  .  Dyslipidemia 06/21/2011  . Mitral regurgitation 06/21/2011    Past Surgical History:  Procedure Laterality Date  . CARDIAC CATHETERIZATION    . CARDIOVERSION N/A 01/25/2019   Procedure: CARDIOVERSION;  Surgeon: [name], MD;  Location: [facility] ENDOSCOPY;  Service: Cardiovascular;  Laterality: N/A;  . CARDIOVERSION N/A 06/14/2019   Procedure: CARDIOVERSION;  Surgeon: [name], MD;  Location: [facility];  Service: Cardiovascular;  Laterality: N/A;  . CESAREAN SECTION    . cesarean section    . CORONARY ARTERY BYPASS GRAFT  10/21/2011   Procedure: CORONARY ARTERY  BYPASS GRAFTING (CABG);  Surgeon: [name], MD;  Location: [facility];  Service: Open Heart Surgery;  Laterality: N/A;  cabg x four, using right leg greater saphenous vein harvested endoscopically  . DENTAL SURGERY    . MITRAL VALVE REPAIR  10/21/2011   Procedure: MITRAL VALVE REPAIR (MVR);  Surgeon: [name], MD;  Location: [facility];  Service: Open Heart Surgery;  Laterality: N/A;  . TEE WITHOUT CARDIOVERSION  06/30/2011   Procedure: TRANSESOPHAGEAL ECHOCARDIOGRAM (TEE);  Surgeon: [name], MD;  Location: [facility] ENDOSCOPY;  Service: Cardiovascular;  Laterality: N/A;  . TONSILLECTOMY    . TUBAL LIGATION       OB History   No obstetric history on file.      Home Medications    Prior to Admission medications   Medication Sig Start Date End Date Taking? Authorizing Provider  amiodarone (PACERONE) 200 MG tablet Take 1 tablet (200 mg total) by mouth daily. 06/05/19   [name], MD  atorvastatin (LIPITOR) 80 MG tablet Take 1 tablet (80 mg total) by mouth daily at 6 PM. 12/25/18   [name], MD  cromolyn (NASALCROM) 5.2 MG/ACT nasal spray Place 1 spray into both nostrils as needed for rhinitis.    [provider]  diphenhydrAMINE (BENADRYL) 25 MG tablet Take 25 mg by mouth every 6 (six) hours as needed for allergies.    [provider]  famotidine (PEPCID) 20 MG tablet Take 20 mg by mouth daily as needed. For indigestion    [provider]  furosemide (LASIX) 20 MG tablet TAKE 1 TABLET BY MOUTH AS NEEDED FOR  SWELLING 12/25/18   Richardson Dopp T, PA-C  losartan (COZAAR) 50 MG tablet Take 0.5 tablets (25 mg total) by mouth daily. 06/15/19   Kroeger, Lorelee Cover., PA-C  rivaroxaban (XARELTO) 20 MG TABS tablet Take 1 tablet (20 mg total) by mouth daily with supper. 06/25/19   [name], MD  Saline 0.2 % SOLN Place into the nose.    [provider]  vitamin B-12 (CYANOCOBALAMIN) 1000 MCG tablet Take 1 tablet (1,000 mcg total) by mouth daily. 06/21/16    Rosalin Hawking, MD  vitamin C (ASCORBIC ACID) 500 MG tablet Take 1,000 mg by mouth 2 (two) times daily.     [provider]  warfarin (COUMADIN) 5 MG tablet Take 0.5-1 tablets (2.5-5 mg total) by mouth See admin instructions. Take 2.5 mg  Sunday, Wednesday, and Friday Take 5 mg all the other day 06/15/19   Abigail Butts., PA-C    Family History Family History  Problem Relation Age of Onset  . Hypertension Mother   . Stroke Mother   . Heart disease Father   . COPD Father   . Anesthesia problems Neg Hx   . Hypotension Neg Hx   . Malignant hyperthermia Neg Hx   . Pseudochol deficiency Neg Hx     Social  History Social History   Tobacco Use  . Smoking status: Former Smoker    Packs/day: 1.00    Years: 30.00    Pack years: 30.00    Quit date: 05/07/2011    Years since quitting: 8.1  . Smokeless tobacco: Never Used  Substance Use Topics  . Alcohol use: Yes    Alcohol/week: 1.0 standard drinks    Types: 1 Glasses of wine per week    Comment: 1 glass of red wine nightly  . Drug use: No     Allergies   Azithromycin, Latex, and Spinach   Review of Systems Review of Systems  All systems reviewed and negative, other than as noted in HPI.  Physical Exam Updated Vital Signs BP 103/86 (BP Location: Right Arm)   Pulse (!) 37   Temp 98.8 F (37.1 C) (Oral)   Resp 16   LMP 05/18/2015 Comment: went for 3 week after skipping two months   SpO2 98%   Physical Exam Vitals signs and nursing note reviewed.  Constitutional:      General: She is not in acute distress.    Appearance: She is well-developed.  HENT:     Head: Normocephalic and atraumatic.  Eyes:     General:        Right eye: No discharge.        Left eye: No discharge.     Conjunctiva/sclera: Conjunctivae normal.  Neck:     Musculoskeletal: Neck supple.  Cardiovascular:     Rate and Rhythm: Regular rhythm. Bradycardia present.     Heart sounds: Normal heart sounds. No murmur. No friction rub. No  gallop.   Pulmonary:     Effort: Pulmonary effort is normal. No respiratory distress.     Breath sounds: Normal breath sounds.  Abdominal:     General: There is no distension.     Palpations: Abdomen is soft.     Tenderness: There is no abdominal tenderness.  Musculoskeletal:        General: No tenderness.  Skin:    General: Skin is warm and dry.  Neurological:     Mental Status: She is alert.  Psychiatric:        Behavior: Behavior normal.        Thought Content: Thought content normal.      ED Treatments / Results  Labs (all labs ordered are listed, but only abnormal results are displayed) Labs Reviewed  CBC WITH DIFFERENTIAL/PLATELET - Abnormal; Notable for the following components:      Result Value   RDW 16.3 (*)    All other components within normal limits  BASIC METABOLIC PANEL - Abnormal; Notable for the following components:   Glucose, Bld 151 (*)    Creatinine, Ser 1.01 (*)    All other components within normal limits  PROTIME-INR - Abnormal; Notable for the following components:   Prothrombin Time 22.7 (*)    INR 2.0 (*)    All other components within normal limits  PROTIME-INR - Abnormal; Notable for the following components:   Prothrombin Time 19.9 (*)    INR 1.7 (*)    All other components within normal limits  BASIC METABOLIC PANEL - Abnormal; Notable for the following components:   Glucose, Bld 117 (*)    All other components within normal limits  SARS CORONAVIRUS 2 BY RT PCR (HOSPITAL ORDER, [facility])  SURGICAL PCR SCREEN  MAGNESIUM  TSH    EKG EKG Interpretation  Date/Time:  Tuesday June 25 2019 12:21:04 EST Ventricular Rate:  42 PR Interval:    QRS Duration: 128 QT Interval:  578 QTC Calculation: 484 R Axis:   116 Text Interpretation: Sinus bradycardia Right bundle branch block Confirmed by Virgel Manifold [phone]) on 06/25/2019 12:39:05 PM   Radiology No results found.  Procedures Procedures  (including critical care time)  Medications Ordered in ED Medications - No data to display   Initial Impression / Assessment and Plan / ED Course  I have reviewed the triage vital signs and the nursing notes.  Pertinent labs & imaging results that were available during my care of the patient were reviewed by me and considered in my medical decision making (see chart for details).        52yF with bradycardia/extended pauses. Electrolytes ok. Cardiology consulted. Admit for pacer.   Final Clinical Impressions(s) / ED Diagnoses   Final diagnoses:  Sinus pause  Sinus bradycardia    ED Discharge Orders    None       Virgel Manifold, MD 06/27/19 [phone]

## 2019-06-25 NOTE — ED Triage Notes (Signed)
Pt BIB EMS [facility]. Pt had an ablasion for a-fib 2 weeks ago, was in today for follow up and HR was 40-45 bpm. Pt denies dizziness, lightheadedness, weakness. AOx4, VSS

## 2019-06-25 NOTE — H&P (Addendum)
ELECTROPHYSIOLOGY H&P NOTE    Patient ID: Ashley [name] MRN: [id number], DOB/AGE: 04/14/1966 53 y.o.  Admit date: 06/25/2019 Date of Consult: 06/25/2019  Primary Physician: [name], MD Primary Cardiologist: Dorris Carnes, MD  Electrophysiologist: Dr. Caryl Comes   Referring Provider: Dr. Wilson Singer  Patient Profile: Ashley [name] is a 53 y.o. female with a history of CAD s/p CABG, valvular heart disease s/p MV repair 2013,  ICM, HTN, HLD, stroke x 2 and paroxysmal Afib. who is being seen today for the evaluation of bradycardia at the request of Dr. Wilson Singer.  HPI:  Ashley [name] is a 53 y.o. female with medical history as above. She was recently admitted to [facility] 06/14/2019. She went for DCCV and had 10 seconds of asystole then into junctional rhythm in the 20s. Treated with atropine and ephedrine, and required dopamine for hypotension with rate improved into 40-50s. Coreg stopped. Dopamine weaned overnight with improvement of HR into 60s.  Amiodarone continued and Zio AT placed on discharge.   IRhythm called office to report pause of 6.2 seconds this am at 9:23 am then sinus brady/Junctional at 28 pm.  Last send was 0952 and showed sinus/junctional rhythm at 31 bpm. Pt denied symptoms, encouraged to go to hospital for further evaluation.   Awaiting strips for confirmation.   She is feeling OK currently, and her HR has improved to 70s without intervention. She was in the 40s on admission. She took her amiodarone this am, coumadin last night. She has not eaten yet today. She was driving when she had her 6 second pause this am. She denies any symptoms including palpitations, lightheadedness, dizziness, syncope, or near syncope since she has had the Zio patch on.   Past Medical History:  Diagnosis Date  . Abdominal pain    ? chronic cholecystitis; admxn to [facility] 9/12 (CT, USN, HIDA done)  . Atrial fibrillation ([place])   . Cardiomyopathy    echo 9/12: mild LVH, EF 35-40%, mod  MR (difficult to judge /post leaflet restricted), mod BAE, mod RVE, mod TR   . Cardiomyopathy ([place])    a. echo 9/12: mild LVH, EF 35-40%, mod MR (difficult to judge /post leaflet restricted), mod BAE, mod RVE, mod TR. b. Echo 11/2011: mild LVH, EF 55-60%, s/p MV repair w/o sig MR/MS, mildly dilated RV/RA, PASP 31mmHg. // AFL dx 10/2018 - Echo 10/2018: EF 30, diff HK, s/p MV repair with trivial MR, mean MV 7 but not c/w significant mitral stenosis, mild RAE, severe LAE, mod reduced RVSF   . Carotid artery disease ([place])    a. Duplex 06/2015: 50% RECA, 1-39% BICA.  [name] Coronary artery disease    a. s/p CABG 10/2011 - LIMA-LAD, SVG-PDA, SVG-OM, SVG-diagonal.  . Dyslipidemia   . Essential hypertension   . Hemifacial spasm   . History of Doppler ultrasound    a. Carotid US [facility] in [place], [name]) 7/17: no hemodynamically significant ICA stenosis  . Mitral regurgitation    a. s/p MV repair 10/2011 - on Coumadin for 3 months afterwards then d/c'd by surgery.  . Recurrent upper respiratory infection (URI)   . 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 Stroke [facility])    a. age 87 - in setting of cigs and OCPs. //  b. s/p acute L parietal CVA >> tPA - [facility] in [place], [name]  . Tobacco abuse      Surgical History:  Past Surgical  History:  Procedure Laterality Date  . CARDIAC CATHETERIZATION    . CARDIOVERSION N/A 01/25/2019   Procedure: CARDIOVERSION;  Surgeon: [name], MD;  Location: [facility] ENDOSCOPY;  Service: Cardiovascular;  Laterality: N/A;  . CARDIOVERSION N/A 06/14/2019   Procedure: CARDIOVERSION;  Surgeon: [name], MD;  Location: [place];  Service: Cardiovascular;  Laterality: N/A;  . CESAREAN SECTION    . cesarean section    . CORONARY ARTERY BYPASS GRAFT  10/21/2011   Procedure: CORONARY ARTERY BYPASS GRAFTING (CABG);  Surgeon: [name], MD;  Location: [place];  Service: Open Heart Surgery;  Laterality: N/A;  cabg x four, using  right leg greater saphenous vein harvested endoscopically  . DENTAL SURGERY    . MITRAL VALVE REPAIR  10/21/2011   Procedure: MITRAL VALVE REPAIR (MVR);  Surgeon: [name], MD;  Location: [place];  Service: Open Heart Surgery;  Laterality: N/A;  . TEE WITHOUT CARDIOVERSION  06/30/2011   Procedure: TRANSESOPHAGEAL ECHOCARDIOGRAM (TEE);  Surgeon: [name], MD;  Location: [facility] ENDOSCOPY;  Service: Cardiovascular;  Laterality: N/A;  . TONSILLECTOMY    . TUBAL LIGATION       (Not in a hospital admission)   Inpatient Medications:  . incobotulinumtoxinA  50 Units Intramuscular Q90 days  . incobotulinumtoxinA  50 Units Intramuscular Q90 days  . incobotulinumtoxinA  50 Units Intramuscular Q90 days    Allergies:  Allergies  Allergen Reactions  . Azithromycin Nausea Only  . Latex Rash    rash  . Spinach Hives    Social History   Socioeconomic History  . Marital status: Married    Spouse name: Not on file  . Number of children: 26  . Years of education: 2  . Highest education level: Not on file  Occupational History  . 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  Social Needs  . Financial resource strain: Not on file  . Food insecurity    Worry: Not on file    Inability: Not on file  . Transportation needs    Medical: Not on file    Non-medical: Not on file  Tobacco Use  . Smoking status: Former Smoker    Packs/day: 1.00    Years: 30.00    Pack years: 30.00    Quit date: 05/07/2011    Years since quitting: 8.1  . Smokeless tobacco: Never Used  Substance and Sexual Activity  . Alcohol use: Yes    Alcohol/week: 1.0 standard drinks    Types: 1 Glasses of wine per week    Comment: 1 glass of red wine nightly  . Drug use: No  . Sexual activity: Yes  Lifestyle  . Physical activity    Days per week: Not on file    Minutes per session: Not on file  . Stress: Not on file  Relationships  . Social Herbalist on phone: Not on  file    Gets together: Not on file    Attends religious service: Not on file    Active member of club or organization: Not on file    Attends meetings of clubs or organizations: Not on file    Relationship status: Not on file  . Intimate partner violence    Fear of current or ex partner: Not on file    Emotionally abused: Not on file    Physically abused: Not on file    Forced sexual activity: Not on file  Other Topics  Concern  . Not on file  Social History Narrative   Lives at home with husband and son.   Right-handed.   2 cups caffeine daily.     Family History  Problem Relation Age of Onset  . Hypertension Mother   . Stroke Mother   . Heart disease Father   . COPD Father   . Anesthesia problems Neg Hx   . Hypotension Neg Hx   . Malignant hyperthermia Neg Hx   . Pseudochol deficiency Neg Hx      Review of Systems: All other systems reviewed and are otherwise negative except as noted above.  Physical Exam: Vitals:   06/25/19 1300 06/25/19 1315 06/25/19 1330 06/25/19 1345  BP: (!) 149/60 (!) 169/58 (!) 160/77 (!) 172/79  Pulse: (!) 38     Resp: 14 16 15 20   Temp:      TempSrc:      SpO2: 99%       GEN- The patient is well appearing, alert and oriented x 3 today.   HEENT: normocephalic, atraumatic; sclera clear, conjunctiva pink; hearing intact; oropharynx clear; neck supple Lungs- Clear to ausculation bilaterally, normal work of breathing.  No wheezes, rales, rhonchi Heart- Regular rate and rhythm, no murmurs, rubs or gallops GI- soft, non-tender, non-distended, bowel sounds present Extremities- no clubbing, cyanosis, or edema; DP/PT/radial pulses 2+ bilaterally MS- no significant deformity or atrophy Skin- warm and dry, no rash or lesion Psych- euthymic mood, full affect Neuro- strength and sensation are intact  Labs:   Lab Results  Component Value Date   WBC 6.3 06/25/2019   HGB 13.9 06/25/2019   HCT 42.8 06/25/2019   MCV 100.0 06/25/2019   PLT 259  06/25/2019    Recent Labs  Lab 06/19/19 1434 06/25/19 1239  NA  --  138  K  --  4.9  CL  --  102  CO2  --  22  BUN  --  14  CREATININE  --  1.01*  CALCIUM  --  9.6  PROT 7.1  --   BILITOT 1.8*  --   ALKPHOS 212*  --   ALT 16  --   AST 17  --   GLUCOSE  --  151*      Radiology/Studies: No results found.  EKG: sinus bradycardia at 42 bpm, QRS 128 bpm (personally reviewed)  TELEMETRY: Initially sinus brady vs junctional rhythm in the 40s, and HR has gradually improved to NSR in the 70s (personally reviewed)  DEVICE HISTORY: Wearing a ZioAT patch currently which has shown multiple 5 to 6.5 second pauses and junctional bradycardia  Assessment/Plan: 1.  Sinus node dysfunction - She has had several instances of pauses up to 10 seconds in the setting of DCCV/post conversion pauses. She has been taken off diltiazem and coreg.   - She is on amiodarone - She is asymptomatic, but has documented pauses and HR < 40 bpm.  - INR 2.0, she has not eaten since last night. COVID test pending.  - Explained risks, benefits, and alternatives to pacemaker implantation, including but not limited to bleeding, infection, damage to heart or lungs, heart attack, stroke, or death.  Pt verbalized understanding and agrees to proceed if indicated. She understands it may be today vs tomorrow  2. Paroxysmal Atrial fibrillation - Currently in sinus/brady - Continue coumadin for CHA2DS2VASC of 6   - June 2020 DCCV > junctional bradycardia > dilt and dig stopped - 05/2019 DCCV > junctional bradycardia > coreg stopped  3. CAD - Denies ischemic symptoms.   4. ICM - Echo 02/2019 LVEF 60-65%  Will discuss timing with Dr. Rayann Heman. Awaiting strips to confirm bradycardia as well awaiting COVID test.   For questions or updates, please contact [place] Please consult [email or web address] for contact info under Cardiology/STEMI.  Signed, Shirley Friar, PA-C  06/25/2019 2:10 PM  I have seen, examined  the patient, and reviewed the above assessment and plan.  Changes to above are made where necessary.  On exam, RRR.  She reports fatigue and decreased exercise tolerance with sick sinus syndrome.  Multiple long pauses are observed without reversible cause.  The patient has symptomatic sinus bradycardia.  I would therefore recommend pacemaker implantation at this time.  Risks, benefits, alternatives to pacemaker implantation were discussed in detail with the patient today. The patient understands that the risks include but are not limited to bleeding, infection, pneumothorax, perforation, tamponade, vascular damage, renal failure, MI, stroke, death,  and lead dislodgement and wishes to proceed. We will therefore schedule the procedure at the next available time.  We also discussed remote monitoring and its role today.   Co Sign: [name], MD 06/25/2019 3:01 PM

## 2019-06-25 NOTE — Telephone Encounter (Signed)
Ashley [name] from [employer] calling to report-- Pt wearing monitor. Reports: pause of 6.2 seconds at 9:23 am today. Also bradycardia SR/Junctional of 28 bpm at 9:34 am today. Latest rhythm is 9:52 am, sinus and junctional at 31 bpm. No symptoms reported by patient for any of these.  Ashley [name] tried to reach patient but she did not answer.    I called and spoke with patient. She is at [facility].  Appt was at 10:10 am for physical.  Did not feel any dizziness or any other symptoms.  She drove herself to this appointment.

## 2019-06-25 NOTE — Progress Notes (Signed)
Subjective:  Patient ID: Ashley [name], female    DOB: [birth year]  Age: 53 y.o. MRN: [id number]  CC: Annual Exam   HPI Perry is a 53 year old female who presents today for an annual physical exam. She is unable to recall dates of her last Pap smear, mammogram and has not had a colonoscopy.  She currently does not have medical coverage.    Medical history is  significant for  hypertension, CAD status post CABG, history of mitral valve repair, atrial fibrillation failed Tikosyn treatment due to prolonged QTC, hospitalized last month for DCCV with period of asystole, junctional rhythm in the 20s which was treated with atropine and ephedrine and dopamine for hypotension currently on Coumadin and amiodarone, has been unable to obtain Xarelto through patient assistance from the cardiology clinic. I had written her prescription for Xarelto to her pharmacy which she never picked up.   She feels fine today and has no concerns, denies chest pain or dyspnea and is currently wearing a Zio patch.  Able to tell when she is not in sinus rhythm by the fact that she gets fatigued which she has not felt lately.  Past Medical History:  Diagnosis Date  . Abdominal pain    ? chronic cholecystitis; admxn to [facility] 9/12 (CT, USN, HIDA done)  . Atrial fibrillation ([place])   . Cardiomyopathy    echo 9/12: mild LVH, EF 35-40%, mod MR (difficult to judge /post leaflet restricted), mod BAE, mod RVE, mod TR   . Cardiomyopathy ([place])    a. echo 9/12: mild LVH, EF 35-40%, mod MR (difficult to judge /post leaflet restricted), mod BAE, mod RVE, mod TR. b. Echo 11/2011: mild LVH, EF 55-60%, s/p MV repair w/o sig MR/MS, mildly dilated RV/RA, PASP 3mmHg. // AFL dx 10/2018 - Echo 10/2018: EF 30, diff HK, s/p MV repair with trivial MR, mean MV 7 but not c/w significant mitral stenosis, mild RAE, severe LAE, mod reduced RVSF   . Carotid artery disease ([place])    a. Duplex 06/2015: 50% RECA, 1-39% BICA.  [name]  Coronary artery disease    a. s/p CABG 10/2011 - LIMA-LAD, SVG-PDA, SVG-OM, SVG-diagonal.  . Dyslipidemia   . Essential hypertension   . Hemifacial spasm   . History of Doppler ultrasound    a. Carotid US [facility] in [place], [name]) 7/17: no hemodynamically significant ICA stenosis  . Mitral regurgitation    a. s/p MV repair 10/2011 - on Coumadin for 3 months afterwards then d/c'd by surgery.  . Recurrent upper respiratory infection (URI)   . 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 Stroke [facility])    a. age 11 - in setting of cigs and OCPs. //  b. s/p acute L parietal CVA >> tPA - [facility] in [place], [name]  . Tobacco abuse     Past Surgical History:  Procedure Laterality Date  . CARDIAC CATHETERIZATION    . CARDIOVERSION N/A 01/25/2019   Procedure: CARDIOVERSION;  Surgeon: [name], MD;  Location: [facility] ENDOSCOPY;  Service: Cardiovascular;  Laterality: N/A;  . CARDIOVERSION N/A 06/14/2019   Procedure: CARDIOVERSION;  Surgeon: [name], MD;  Location: [place];  Service: Cardiovascular;  Laterality: N/A;  . CESAREAN SECTION    . cesarean section    . CORONARY ARTERY BYPASS GRAFT  10/21/2011   Procedure: CORONARY ARTERY BYPASS GRAFTING (CABG);  Surgeon: [name], MD;  Location: [facility]  OR;  Service: Open Heart Surgery;  Laterality: N/A;  cabg x four, using right leg greater saphenous vein harvested endoscopically  . DENTAL SURGERY    . MITRAL VALVE REPAIR  10/21/2011   Procedure: MITRAL VALVE REPAIR (MVR);  Surgeon: [name], MD;  Location: [place];  Service: Open Heart Surgery;  Laterality: N/A;  . TEE WITHOUT CARDIOVERSION  06/30/2011   Procedure: TRANSESOPHAGEAL ECHOCARDIOGRAM (TEE);  Surgeon: [name], MD;  Location: [facility] ENDOSCOPY;  Service: Cardiovascular;  Laterality: N/A;  . TONSILLECTOMY    . TUBAL LIGATION      Family History  Problem Relation Age of Onset  . Hypertension Mother   . Stroke Mother   . Heart  disease Father   . COPD Father   . Anesthesia problems Neg Hx   . Hypotension Neg Hx   . Malignant hyperthermia Neg Hx   . Pseudochol deficiency Neg Hx     Allergies  Allergen Reactions  . Azithromycin Nausea Only  . Latex Rash    rash  . Spinach Hives    Outpatient Medications Prior to Visit  Medication Sig Dispense Refill  . amiodarone (PACERONE) 200 MG tablet Take 1 tablet (200 mg total) by mouth daily. 90 tablet 3  . atorvastatin (LIPITOR) 80 MG tablet Take 1 tablet (80 mg total) by mouth daily at 6 PM. 90 tablet 3  . cromolyn (NASALCROM) 5.2 MG/ACT nasal spray Place 1 spray into both nostrils as needed for rhinitis.    [name] diphenhydrAMINE (BENADRYL) 25 MG tablet Take 25 mg by mouth every 6 (six) hours as needed for allergies.    . famotidine (PEPCID) 20 MG tablet Take 20 mg by mouth daily as needed. For indigestion    . furosemide (LASIX) 20 MG tablet TAKE 1 TABLET BY MOUTH AS NEEDED FOR  SWELLING 90 tablet 2  . losartan (COZAAR) 50 MG tablet Take 0.5 tablets (25 mg total) by mouth daily. 90 tablet 3  . Saline 0.2 % SOLN Place into the nose.    . vitamin B-12 (CYANOCOBALAMIN) 1000 MCG tablet Take 1 tablet (1,000 mcg total) by mouth daily.    . vitamin C (ASCORBIC ACID) 500 MG tablet Take 1,000 mg by mouth 2 (two) times daily.     [name] warfarin (COUMADIN) 5 MG tablet Take 0.5-1 tablets (2.5-5 mg total) by mouth See admin instructions. Take 2.5 mg  Sunday, Wednesday, and Friday Take 5 mg all the other day     Facility-Administered Medications Prior to Visit  Medication Dose Route Frequency Provider Last Rate Last Dose  . incobotulinumtoxinA (XEOMIN) 50 units injection 50 Units  50 Units Intramuscular Q90 days Marcial Pacas, MD   50 Units at 11/30/16 1546  . incobotulinumtoxinA (XEOMIN) 50 units injection 50 Units  50 Units Intramuscular Q90 days Marcial Pacas, MD   50 Units at 07/25/17 [id number]  . incobotulinumtoxinA (XEOMIN) 50 units injection 50 Units  50 Units Intramuscular Q90 days Marcial Pacas, MD   50 Units at 07/26/17 1217     ROS Review of Systems  Constitutional: Negative for activity change, appetite change and fatigue.  HENT: Negative for congestion, sinus pressure and sore throat.   Eyes: Negative for visual disturbance.  Respiratory: Negative for cough, chest tightness, shortness of breath and wheezing.   Cardiovascular: Negative for chest pain and palpitations.  Gastrointestinal: Negative for abdominal distention, abdominal pain and constipation.  Endocrine: Negative for polydipsia.  Genitourinary: Negative for dysuria and frequency.  Musculoskeletal: Negative for arthralgias  and back pain.  Skin: Negative for rash.  Neurological: Negative for tremors, light-headedness and numbness.  Hematological: Does not bruise/bleed easily.  Psychiatric/Behavioral: Negative for agitation and behavioral problems.    Objective:  BP (!) 142/60   Pulse (!) 36   Temp 98.2 F (36.8 C) (Oral)   Ht 5\' 8"  (1.727 m)   Wt 199 lb (90.3 kg)   LMP 05/18/2015 Comment: went for 3 week after skipping two months   SpO2 99%   BMI 30.26 kg/m   BP/Weight 06/25/2019 06/19/2019 [id number]  Systolic BP [id number]  Diastolic BP 60 88 48  Wt. (Lbs) 199 201 -  BMI 30.26 32.44 -      Physical Exam Chaperone present: Zio patch on L chestwall.  Constitutional:      General: She is not in acute distress.    Appearance: She is well-developed. She is not diaphoretic.  HENT:     Head: Normocephalic.     Right Ear: External ear normal.     Left Ear: External ear normal.     Nose: Nose normal.  Eyes:     Comments: Hemifacial spasms prominent in right eyelid  Neck:     Musculoskeletal: Normal range of motion.     Vascular: No JVD.  Cardiovascular:     Rate and Rhythm: Normal rate and regular rhythm.     Heart sounds: Normal heart sounds. No murmur. No gallop.   Pulmonary:     Effort: Pulmonary effort is normal. No respiratory distress.     Breath sounds: Normal breath sounds. No  wheezing or rales.  Chest:     Chest wall: No tenderness.     Breasts:        Right: No mass or tenderness.        Left: No mass or tenderness.  Abdominal:     General: Bowel sounds are normal. There is no distension.     Palpations: Abdomen is soft. There is no mass.     Tenderness: There is no abdominal tenderness.  Genitourinary:    Comments: External genitalia, vagina, cervix, adnexa-normal Musculoskeletal: Normal range of motion.        General: No tenderness.  Skin:    General: Skin is warm and dry.  Neurological:     Mental Status: She is alert and oriented to person, place, and time.     Deep Tendon Reflexes: Reflexes are normal and symmetric.     CMP Latest Ref Rng & Units 06/19/2019 06/15/2019 06/14/2019  Glucose 70 - 99 mg/dL - 99 130(H)  BUN 6 - 20 mg/dL - 12 12  Creatinine 0.44 - 1.00 mg/dL - 1.02(H) 0.80  Sodium 135 - 145 mmol/L - 138 138  Potassium 3.5 - 5.1 mmol/L - 4.0 4.7  Chloride 98 - 111 mmol/L - 104 102  CO2 22 - 32 mmol/L - 23 -  Calcium 8.9 - 10.3 mg/dL - 8.9 -  Total Protein 6.0 - 8.5 g/dL 7.1 - -  Total Bilirubin 0.0 - 1.2 mg/dL 1.8(H) - -  Alkaline Phos 39 - 117 IU/L 212(H) - -  AST 0 - 40 IU/L 17 - -  ALT 0 - 32 IU/L 16 - -    Lipid Panel     Component Value Date/Time   CHOL 114 11/16/2017 0838   TRIG 68 11/16/2017 0838   HDL 43 11/16/2017 0838   CHOLHDL 2.7 11/16/2017 0838   CHOLHDL 3.8 07/01/2016 0939   VLDL 25 07/01/2016 0939  LDLCALC 57 11/16/2017 0838    CBC    Component Value Date/Time   WBC 5.6 01/21/2019 1029   WBC 6.9 06/23/2015 0938   RBC 3.64 (L) 01/21/2019 1029   RBC 3.73 (L) 06/23/2015 0938   HGB 12.6 06/14/2019 0928   HGB 11.7 01/21/2019 1029   HCT 37.0 06/14/2019 0928   HCT 35.0 01/21/2019 1029   PLT 187 01/21/2019 1029   MCV 96 01/21/2019 1029   MCH 32.1 01/21/2019 1029   MCH 33.0 06/23/2015 0938   MCHC 33.4 01/21/2019 1029   MCHC 33.4 06/23/2015 0938   RDW 12.9 01/21/2019 1029   LYMPHSABS 1.5 06/23/2015  0938   MONOABS 1.0 06/23/2015 0938   EOSABS 0.3 06/23/2015 0938   BASOSABS 0.1 06/23/2015 [id number]    Lab Results  Component Value Date   HGBA1C 5.5 04/12/2016    Assessment & Plan:   1. Annual physical exam Counseled on 150 minutes of exercise per week, healthy eating (including decreased daily intake of saturated fats, cholesterol, added sugars, sodium)  2. Encounter for screening mammogram for malignant neoplasm of breast - MM DIGITAL SCREENING BILATERAL; Future  3. Screening for cervical cancer - Cytology - PAP([place])  4. Screening for colon cancer - Fecal occult blood, imunochemical([facility])  5. Screening for viral disease - Hepatitis c antibody (reflex)   With regards to her atrial fibrillation I have written a prescription for Xarelto which the pharmacy on site will assist her with obtaining through the patient assistance program.  Once she has obtained each she will reach out to her Coumadin clinic with cardiology to inform them of this and will provide instructions with regards to transitioning to Xarelto.  Meds ordered this encounter  Medications  . rivaroxaban (XARELTO) 20 MG TABS tablet    Sig: Take 1 tablet (20 mg total) by mouth daily with supper.    Dispense:  30 tablet    Refill:  3    Discontinue Coumadin once Xarelto is obtained    Follow-up: Return in about 3 months (around 09/25/2019) for Chronic medical conditions.       [name], MD, FAAFP. [facility] and [place], [place]   06/25/2019, 10:59 AM

## 2019-06-25 NOTE — Telephone Encounter (Signed)
I spoke to the patient and she is calling EMS to transport her to the ED for further evaluation of her monitor event.

## 2019-06-25 NOTE — Telephone Encounter (Signed)
Patient is returning call, said she got disconnected.

## 2019-06-25 NOTE — Telephone Encounter (Signed)
Received reports from [facility]. Attempted to reach patient and also attempted to contact [facility] to adv patient not to drive but to go to hospital by EMS at this time.

## 2019-06-26 ENCOUNTER — Ambulatory Visit (HOSPITAL_COMMUNITY): Payer: Self-pay

## 2019-06-26 ENCOUNTER — Encounter (HOSPITAL_COMMUNITY): Payer: Self-pay | Admitting: Internal Medicine

## 2019-06-26 DIAGNOSIS — I495 Sick sinus syndrome: Secondary | ICD-10-CM

## 2019-06-26 LAB — BASIC METABOLIC PANEL
Anion gap: 10 (ref 5–15)
BUN: 16 mg/dL (ref 6–20)
CO2: 22 mmol/L (ref 22–32)
Calcium: 9.4 mg/dL (ref 8.9–10.3)
Chloride: 107 mmol/L (ref 98–111)
Creatinine, Ser: 0.83 mg/dL (ref 0.44–1.00)
GFR calc Af Amer: >60 mL/min (ref >60)
GFR calc non Af Amer: >60 mL/min (ref >60)
Glucose, Bld: 117 mg/dL — ABNORMAL HIGH (ref 70–99)
Potassium: 3.8 mmol/L (ref 3.5–5.1)
Sodium: 139 mmol/L (ref 135–145)

## 2019-06-26 LAB — PROTIME-INR
INR: 1.7 — ABNORMAL HIGH (ref 0.8–1.2)
Prothrombin Time: 19.9 seconds — ABNORMAL HIGH (ref 11.4–15.2)

## 2019-06-26 LAB — HEPATITIS C ANTIBODY (REFLEX): HCV Ab: <0.1 s/co ratio (ref 0.0–0.9)

## 2019-06-26 LAB — HCV COMMENT:

## 2019-06-26 MED FILL — Fentanyl Citrate Preservative Free (PF) Inj 100 MCG/2ML: INTRAMUSCULAR | Qty: 2 | Status: AC

## 2019-06-26 NOTE — Progress Notes (Signed)
Discharge instructions reviewed with pt by RN Roderic Ovens.  Copy of instructions were given to pt.  Pt d/c'd via wheelchair with belongings, with husband.            Escorted by unit NT.

## 2019-06-26 NOTE — Discharge Summary (Addendum)
ELECTROPHYSIOLOGY PROCEDURE DISCHARGE SUMMARY    Patient ID: [name],  MRN: [id number], DOB/AGE: [birth year] 55 y.o.  Admit date: 06/25/2019 Discharge date: 06/26/2019  Primary Care Physician: [name], MD  Primary Cardiologist: Dorris Carnes, MD  Electrophysiologist: Virl Axe, MD  Primary Discharge Diagnosis:  Sinus node dysfunction status post pacemaker implantation this admission  Secondary Discharge Diagnosis:  CAD HTN HLD Paroxysmal Afib  Allergies  Allergen Reactions  . Azithromycin Nausea Only  . Latex Rash  . Spinach Hives  . Tape Other (See Comments)    PLEASE USE PAPER TAPE!! AFFECTS SKIN BADLY!!!!     Procedures This Admission:  1.  Implantation of a St. Jude dual chamber PPM on 06/25/2019 by Dr. Rayann Heman.  The patient received a St. Jude model number [id number] PPM with model number [id number] right atrial lead and [id number] right ventricular lead. There were no immediate post procedure complications. 2.  CXR on 06/26/19  demonstrated no pneumothorax status post device implantation.   Brief HPI: Ashley [name] is a 53 y.o. female followed by Dr. Caryl Comes for problems above.  Pt had several episodes of documented sinus node dysfunction and bradycardia s/p cardioversion requiring cessation of BBs and CCBs. Zio patch placed and alerted staff of 5-6 second pauses with HRs in the 20-30s at times. It was thought prudent to proceed with PPM implantation at this time. Past medical history includes above.  The patient has had sinus node dysfunction  without reversible causes identified.  Risks, benefits, and alternatives to PPM implantation were reviewed with the patient who wished to proceed.   Hospital Course:  The patient was admitted and underwent implantation of a St. Jude dual chamber PPM with details as outlined above.  She was monitored on telemetry overnight which demonstrated NSR in 60-70s.  Left chest was without hematoma but mild  ecchymosis.  The device was interrogated and found to be functioning normally.  CXR was obtained and demonstrated no pneumothorax status post device implantation.  Wound care, arm mobility, and restrictions were reviewed with the patient.  The patient was examined and considered stable for discharge to home.   INR 1.7 on day of discharge. No bridge with PPM implant 11/3. Will follow very closely with INR check 07/01/2019 due to recent DCCV (10/23)  Physical Exam: Vitals:   06/25/19 2359 06/26/19 0300 06/26/19 0358 06/26/19 0559  BP: (!) 143/77  (!) 170/83   Pulse: 70  75   Resp: 20  20   Temp: 98.5 F (36.9 C)  98.2 F (36.8 C)   TempSrc: Oral  Oral   SpO2: 97%  95%   Weight:  88.8 kg  88.8 kg  Height:        GEN- The patient is well appearing, alert and oriented x 3 today.   HEENT: normocephalic, atraumatic; sclera clear, conjunctiva pink; hearing intact; oropharynx clear; neck supple, no JVP Lymph- no cervical lymphadenopathy Lungs- Clear to ausculation bilaterally, normal work of breathing.  No wheezes, rales, rhonchi Heart- Regular rate and rhythm, no murmurs, rubs or gallops, PMI not laterally displaced GI- soft, non-tender, non-distended, bowel sounds present, no hepatosplenomegaly Extremities- no clubbing, cyanosis, or edema; DP/PT/radial pulses 2+ bilaterally MS- no significant deformity or atrophy Skin- warm and dry, no rash or lesion, left chest without hematoma/ecchymosis Psych- euthymic mood, full affect Neuro- strength and sensation are intact   Labs:   Lab Results  Component Value Date   WBC 6.3 06/25/2019   HGB 13.9 06/25/2019  HCT 42.8 06/25/2019   MCV 100.0 06/25/2019   PLT 259 06/25/2019    Recent Labs  Lab 06/19/19 1434  06/26/19 0322  NA  --    < > 139  K  --    < > 3.8  CL  --    < > 107  CO2  --    < > 22  BUN  --    < > 16  CREATININE  --    < > 0.83  CALCIUM  --    < > 9.4  PROT 7.1  --   --   BILITOT 1.8*  --   --   ALKPHOS 212*  --   --    ALT 16  --   --   AST 17  --   --   GLUCOSE  --    < > 117*   < > = values in this interval not displayed.    Discharge Medications:  Allergies as of 06/26/2019      Reactions   Azithromycin Nausea Only   Latex Rash   Spinach Hives   Tape Other (See Comments)   PLEASE USE PAPER TAPE!! AFFECTS SKIN BADLY!!!!      Medication List    STOP taking these medications   rivaroxaban 20 MG Tabs tablet Commonly known as: Xarelto     TAKE these medications   acetaminophen 500 MG tablet Commonly known as: TYLENOL Take 500-1,000 mg by mouth every 6 (six) hours as needed for headache.   amiodarone 200 MG tablet Commonly known as: PACERONE Take 1 tablet (200 mg total) by mouth daily.   amLODipine 5 MG tablet Commonly known as: NORVASC Take 5 mg by mouth daily.   atorvastatin 80 MG tablet Commonly known as: LIPITOR Take 1 tablet (80 mg total) by mouth daily at 6 PM.   diphenhydrAMINE 25 MG tablet Commonly known as: BENADRYL Take 25 mg by mouth every 6 (six) hours as needed for allergies.   famotidine 20 MG tablet Commonly known as: PEPCID Take 20 mg by mouth daily as needed for indigestion.   furosemide 20 MG tablet Commonly known as: LASIX TAKE 1 TABLET BY MOUTH AS NEEDED FOR  SWELLING What changed: See the new instructions.   losartan 50 MG tablet Commonly known as: COZAAR Take 0.5 tablets (25 mg total) by mouth daily.   Saline 0.2 % Soln Place 1 spray into both nostrils as needed (for congestion).   vitamin B-12 1000 MCG tablet Commonly known as: CYANOCOBALAMIN Take 1 tablet (1,000 mcg total) by mouth daily.   vitamin C 500 MG tablet Commonly known as: ASCORBIC ACID Take 1,000 mg by mouth 2 (two) times daily.   warfarin 5 MG tablet Commonly known as: COUMADIN Take as directed. If you are unsure how to take this medication, talk to your nurse or doctor. Original instructions: Take 0.5-1 tablets (2.5-5 mg total) by mouth See admin instructions. Take 2.5 mg   Sunday, Wednesday, and Friday Take 5 mg all the other day What changed: additional instructions       Disposition:   Follow-up Information    [name], MD Follow up on 09/30/2019.   Specialty: Cardiology Why: at 3 pm for 3 months post pacemaker check Contact information: [address] [place] [address] [place] Follow up on 07/09/2019.   Why: at 0930 for post pacemaker wound check Contact  information: [place] [phone]       [place] [facility] Follow up on 07/01/2019.   Specialty: Cardiology Why: at 2 pm for post hospital coumadin check.  Contact information: [address] [place] [phone]          Duration of Discharge Encounter: Greater than 30 minutes including physician time.  Signed, Shirley Friar, PA-C  06/26/2019 9:47 AM   I have seen, examined the patient, and reviewed the above assessment and plan.  Changes to above are made where necessary.  On exam, RRR.  Device interrogation reviewed and normal  DC to home Follow-up with Dr Caryl Comes  Co Sign: [name], MD

## 2019-06-26 NOTE — Plan of Care (Signed)
  Problem: Education: Goal: Knowledge of cardiac device and self-care will improve Outcome: Progressing   Problem: Education: Goal: Ability to safely manage health related needs after discharge will improve Outcome: Progressing   Problem: Activity: Goal: Risk for activity intolerance will decrease Outcome: Progressing   Problem: Pain Managment: Goal: General experience of comfort will improve Outcome: Progressing

## 2019-06-26 NOTE — Discharge Instructions (Signed)
After Your Pacemaker   You have a St. Jude Pacemaker   Do not lift your arm above shoulder height for 1 week after your procedure. After 7 days, you may progress as below.     Tuesday July 02, 2019  Wednesday July 03, 2019 Thursday July 04, 2019 Friday July 05, 2019    Do not lift, push, pull, or carry anything over 10 pounds with the affected arm until 6 weeks (Wednesday August 07, 2019) after your procedure.    Do not drive until your wound check, or until instructed by your healthcare provider that you are safe to do so.    Monitor your pacemaker site for redness, swelling, and drainage. Call the device clinic at [phone] if you experience these symptoms or fever/chills.   If your incision is sealed with Steri-strips or staples, you may shower 7 days after your procedure. Do not remove the steri-strips or let the shower hit directly on your site. You may wash around your site with soap and water. If your incision is closed with Dermabond/Surgical glue. You may shower 1 day after your pacemaker implant and wash around the site with soap and water. Avoid lotions, ointments, or perfumes over your incision until it is well-healed.   You may use a hot tub or a pool AFTER your wound check appointment if the incision is completely closed.   Your Pacemaker may be MRI compatible. We will discuss this at your first follow up/wound check. .    Remote monitoring is used to monitor your pacemaker from home. This monitoring is scheduled every 91 days by our office. It allows [place] to keep an eye on the functioning of your device to ensure it is working properly. You will routinely see your Electrophysiologist annually (more often if necessary).    Pacemaker Implantation, Care After This sheet gives you information about how to care for yourself after your procedure. Your health care provider may also give you more specific instructions. If you have problems or questions,  contact your health care provider. What can I expect after the procedure? After the procedure, it is common to have:  Mild pain.  Slight bruising.  Some swelling over the incision.  A slight bump over the skin where the device was placed. Sometimes, it is possible to feel the device under the skin. This is normal.  You should received your Pacemaker ID card within 4-8 weeks. Follow these instructions at home: Medicines  Take over-the-counter and prescription medicines only as told by your health care provider.  If you were prescribed an antibiotic medicine, take it as told by your health care provider. Do not stop taking the antibiotic even if you start to feel better. Wound care     Do not remove the bandage on your chest until directed to do so by your health care provider.  After your bandage is removed, you may see pieces of tape called skin adhesive strips over the area where the cut was made (incision site). Let them fall off on their own.  Check the incision site every day to make sure it is not infected, bleeding, or starting to pull apart.  Do not use lotions or ointments near the incision site unless directed to do so.  Keep the incision area clean and dry for 7 days after the procedure or as directed by your health care provider. It takes several weeks for the incision site to completely heal.  Do not take baths, swim, or use  a hot tub for 7-10 days or as otherwise directed by your health care provider. Activity  Do not drive or use heavy machinery while taking prescription pain medicine.  Do not drive for 24 hours if you were given a medicine to help you relax (sedative).  Check with your health care provider before you start to drive or play sports.  Avoid sudden jerking, pulling, or chopping movements that pull your upper arm far away from your body. Avoid these movements for at least 6 weeks or as long as told by your health care provider.  Do not lift your  upper arm above your shoulders for at least 6 weeks or as long as told by your health care provider. This means no tennis, golf, or swimming.  You may go back to work when your health care provider says it is okay. Pacemaker care  You may be shown how to transfer data from your pacemaker through the phone to your health care provider.  Always let all health care providers know about your pacemaker before you have any medical procedures or tests.  Wear a medical ID bracelet or necklace stating that you have a pacemaker. Carry a pacemaker ID card with you at all times.  Your pacemaker battery will last for 5-15 years. Routine checks by your health care provider will let the health care provider know when the battery is starting to run down. The pacemaker will need to be replaced when the battery starts to run down.  Do not use amateur Chief of Staff. Other electrical devices are safe to use, including power tools, lawn mowers, and speakers. If you are unsure of whether something is safe to use, ask your health care provider.  When using your cell phone, hold it to the ear opposite the pacemaker. Do not leave your cell phone in a pocket over the pacemaker.  Avoid places or objects that have a strong electric or magnetic field, including: ? Airport Herbalist. When at the airport, let officials know that you have a pacemaker. ? Power plants. ? Large electrical generators. ? Radiofrequency transmission towers, such as cell phone and radio towers. General instructions  Weigh yourself every day. If you suddenly gain weight, fluid may be building up in your body.  Keep all follow-up visits as told by your health care provider. This is important. Contact a health care provider if:  You gain weight suddenly.  Your legs or feet swell.  It feels like your heart is fluttering or skipping beats (heart palpitations).  You have chills or a fever.  You have more  redness, swelling, or pain around your incisions.  You have more fluid or blood coming from your incisions.  Your incisions feel warm to the touch.  You have pus or a bad smell coming from your incisions. Get help right away if:  You have chest pain.  You have trouble breathing or are short of breath.  You become extremely tired.  You are light-headed or you faint. This information is not intended to replace advice given to you by your health care provider. Make sure you discuss any questions you have with your health care provider.

## 2019-06-26 NOTE — TOC Progression Note (Signed)
Transition of Care [facility]) - Progression Note    Patient Details  Name: Neeta Matias Barrie MRN: [id number] Date of Birth: 1966-01-16  Transition of [facility]) CM/SW Contact  Zenon Mayo, RN Phone Number: 06/26/2019, 10:34 AM  Clinical Narrative:    Patient from home with spouse, she is for dc today, she will go to coumadin clinic on Monday, she is established at [facility] clinic apt is 11/30 at 2:30.  She has all her meds filled at home does not need any ast with meds at dc.  No other needs.        Expected Discharge Plan and Services           Expected Discharge Date: 06/26/19                                     Social Determinants of Health (SDOH) Interventions    Readmission Risk Interventions No flowsheet data found.

## 2019-06-27 LAB — CYTOLOGY - PAP
Adequacy: ABSENT
Comment: NEGATIVE
Diagnosis: NEGATIVE
High risk HPV: NEGATIVE

## 2019-07-01 ENCOUNTER — Other Ambulatory Visit: Payer: Self-pay

## 2019-07-01 ENCOUNTER — Ambulatory Visit (INDEPENDENT_AMBULATORY_CARE_PROVIDER_SITE_OTHER): Payer: Self-pay | Admitting: *Deleted

## 2019-07-01 ENCOUNTER — Telehealth: Payer: Self-pay

## 2019-07-01 DIAGNOSIS — Z8673 Personal history of transient ischemic attack (TIA), and cerebral infarction without residual deficits: Secondary | ICD-10-CM

## 2019-07-01 DIAGNOSIS — I4891 Unspecified atrial fibrillation: Secondary | ICD-10-CM

## 2019-07-01 LAB — POCT INR: INR: 2.4 (ref 2.0–3.0)

## 2019-07-01 NOTE — Patient Instructions (Signed)
Description   Continue same dosage 1 tablet daily except for 1/2 a tablet on Sundays, Wednesdays and Fridays. Call Coumadin Clinic [phone] if you have any changes in your medication or upcoming procedures. Pt stated that she is waiting on financial advisor to review some things before she bring paper work for [place] back.

## 2019-07-01 NOTE — Telephone Encounter (Signed)
The pt states she had her ppm put in on 06/24/2020. She woke up with some dry blood on her shirt. She states that the device site is not oozing with blood. She said she have a coumadin appointment today and wants to know if the device nurse could take a look at it then. Pt do have a device clinic appointment on 07-11-2019

## 2019-07-01 NOTE — Telephone Encounter (Signed)
Patient reports that she woke up with small area of dried  blood on her shirt near device implant site.  No active bleeding, no edema, no increased pain at site. Patient is on Coumadin and she reports she was sleeping on her left side. Advised patient to avoid sleeping on her left side and to call clinic if she develops bleeding, drainage, edema or increased pain at incision site. Direct device clinic number provided.Follow up for wound check on 07/11/19.

## 2019-07-04 ENCOUNTER — Other Ambulatory Visit: Payer: Self-pay

## 2019-07-04 ENCOUNTER — Ambulatory Visit (INDEPENDENT_AMBULATORY_CARE_PROVIDER_SITE_OTHER): Payer: Self-pay

## 2019-07-04 VITALS — BP 188/82 | HR 69

## 2019-07-04 DIAGNOSIS — I10 Essential (primary) hypertension: Secondary | ICD-10-CM

## 2019-07-04 MED ORDER — LOSARTAN POTASSIUM 50 MG PO TABS: 50.0000 mg | ORAL_TABLET | Freq: Every day | ORAL | 3 refills | Status: DC

## 2019-07-04 NOTE — Patient Instructions (Addendum)
Thank you for seeing [place] today!  Your BP was 192/78, and 194/90  Increase the losartan to 50 mg by mouth once daily.  Continue taking your other medicines as prescribed.   Your next visit with [place] will be on 12/7 at 8:30 AM with your Coumadin visit.   Please call us at [phone] if you have any questions or concerns.

## 2019-07-04 NOTE — Progress Notes (Signed)
Patient ID: Ashley [name]                 DOB: [birth year]                      MRN: [id number]     HPI: Ashley [name] is a 53 y.o. female referred by Dr. Caryl Comes to HTN clinic. PMH is significant for CAD, VHD, (CABG, MV repair, 2013), ICM, HTN, HLD, stroke (at 53 y/o in setting of tobacco use and OBC) another stroke 2017, and paroxysmal AFib. Patient was last seen by Tommye Standard, PA on 06/19/19. At this visit, her home BP readings reported were 140-160/70-80 and her BP in the visit was 138/88.  Patient presents today for initial visit to HTN clinic. She reports her home BP readings are usually 140-150/70-85 and has brought in her meter today. She states she has taken her medications about 2 hours before the visit. Her initial BP was elevated at 192/78. Follow up BP check from her meter was 194/90. Patient does admit that she and her family all experience white coat hypertension. She also admits to eating a very salty dinner last night. She denies any shortness of breath, dizziness, lightheadedness, headaches, blurry vision, and only complains of mild swelling. She states she only takes her furosemide about once every 1-2 weeks but did take a dose this morning since she felt her dinner last night would cause some swelling today. At the end of the visit, her BP was 188/82.  Current HTN meds: amlodipine 5 mg daily, losartan 25 mg daily, furosemide 20 mg PRN edema   Previously tried: carvedilol (bradycardia), diltiazem (stopped for cardioversion, discharged with amiodarone following cardioversion), hydrochlorothiazide (stopped at hospital discharge 10/2011, no intolerance noted), lisinopril (held while titrating up on rate-controlling medications to avoid hypotension), metoprolol (stopped at hospital discharge 10/2011 and switched back to PTA carvedilol)  BP goal: <130/17mmHg  Family History: COPD in her father; Heart disease in her father; Hypertension in her mother; Stroke in her mother.   Social History: former smoker - quit about 7 years ago; red wine 2 glasses per day  Diet: does not cook with salt, bake and grilled meats, fried once every 3 months, fruits and vegetables, limits greens with coumadin   Exercise: more exercise since PPM  Home BP readings: systolic 123456, diastolic 99991111  HR 123XX123  Wt Readings from Last 3 Encounters:  06/26/19 195 lb 11.2 oz (88.8 kg)  06/25/19 199 lb (90.3 kg)  06/19/19 201 lb (91.2 kg)   BP Readings from Last 3 Encounters:  06/26/19 (!) 170/83  06/25/19 (!) 142/60  06/19/19 138/88   Pulse Readings from Last 3 Encounters:  06/26/19 75  06/25/19 (!) 36  06/19/19 70    Renal function: Estimated Creatinine Clearance: 90.4 mL/min (by C-G formula based on SCr of 0.83 mg/dL).  Past Medical History:  Diagnosis Date  . Abdominal pain    ? chronic cholecystitis; adm[facility]n to [facility] 9/12 (CT, USN, HIDA done)  . Atrial fibrillation ([place])   . Cardiomyopathy    echo 9/12: mild LVH, EF 35-40%, mod MR (difficult to judge /post leaflet restricted), mod BAE, mod RVE, mod TR   . Cardiomyopathy ([place])    a. echo 9/12: mild LVH, EF 35-40%, mod MR (difficult to judge /post leaflet restricted), mod BAE, mod RVE, mod TR. b. Echo 11/2011: mild LVH, EF 55-60%, s/p MV repair w/o sig MR/MS, mildly dilated RV/RA, PASP 96mmHg. // AFL dx 10/2018 -  Echo 10/2018: EF 30, diff HK, s/p MV repair with trivial MR, mean MV 7 but not c/w significant mitral stenosis, mild RAE, severe LAE, mod reduced RVSF   . Carotid artery disease ([place])    a. Duplex 06/2015: 50% RECA, 1-39% BICA.  [name] Coronary artery disease    a. s/p CABG 10/2011 - LIMA-LAD, SVG-PDA, SVG-OM, SVG-diagonal.  . Dyslipidemia   . Essential hypertension   . Hemifacial spasm   . History of Doppler ultrasound    a. Carotid US [facility] in [place], [name]) 7/17: no hemodynamically significant ICA stenosis  . Mitral regurgitation    a. s/p MV repair 10/2011 - on Coumadin for 3 months afterwards then  d/c'd by surgery.  . Recurrent upper respiratory infection (URI)   . 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 Stroke [facility])    a. age 37 - in setting of cigs and OCPs. //  b. s/p acute L parietal CVA >> tPA - [facility] in [place], [name]  . Tobacco abuse     Current Outpatient Medications on File Prior to Visit  Medication Sig Dispense Refill  . acetaminophen (TYLENOL) 500 MG tablet Take 500-1,000 mg by mouth every 6 (six) hours as needed for headache.    [name] amiodarone (PACERONE) 200 MG tablet Take 1 tablet (200 mg total) by mouth daily. 90 tablet 3  . amLODipine (NORVASC) 5 MG tablet Take 5 mg by mouth daily.    [name] atorvastatin (LIPITOR) 80 MG tablet Take 1 tablet (80 mg total) by mouth daily at 6 PM. 90 tablet 3  . diphenhydrAMINE (BENADRYL) 25 MG tablet Take 25 mg by mouth every 6 (six) hours as needed for allergies.    . famotidine (PEPCID) 20 MG tablet Take 20 mg by mouth daily as needed for indigestion.     . furosemide (LASIX) 20 MG tablet TAKE 1 TABLET BY MOUTH AS NEEDED FOR  SWELLING (Patient taking differently: Take 20 mg by mouth daily as needed for edema. ) 90 tablet 2  . losartan (COZAAR) 50 MG tablet Take 0.5 tablets (25 mg total) by mouth daily. 90 tablet 3  . Saline 0.2 % SOLN Place 1 spray into both nostrils as needed (for congestion).     . vitamin B-12 (CYANOCOBALAMIN) 1000 MCG tablet Take 1 tablet (1,000 mcg total) by mouth daily.    . vitamin C (ASCORBIC ACID) 500 MG tablet Take 1,000 mg by mouth 2 (two) times daily.     [name] warfarin (COUMADIN) 5 MG tablet Take 0.5-1 tablets (2.5-5 mg total) by mouth See admin instructions. Take 2.5 mg  Sunday, Wednesday, and Friday Take 5 mg all the other day (Patient taking differently: Take 2.5-5 mg by mouth See admin instructions. Take 2.5 mg by mouth in the evening on Sun/Wed/Fri and 5 mg on Mon/Tues/Thurs/Sat)     Current Facility-Administered Medications on File Prior to Visit  Medication Dose Route  Frequency Provider Last Rate Last Dose  . incobotulinumtoxinA (XEOMIN) 50 units injection 50 Units  50 Units Intramuscular Q90 days Marcial Pacas, MD   50 Units at 11/30/16 1546  . incobotulinumtoxinA (XEOMIN) 50 units injection 50 Units  50 Units Intramuscular Q90 days Marcial Pacas, MD   50 Units at 07/25/17 [phone]  . incobotulinumtoxinA (XEOMIN) 50 units injection 50 Units  50 Units Intramuscular Q90 days Marcial Pacas, MD   50 Units at 07/26/17 1217    Allergies  Allergen Reactions  .  Azithromycin Nausea Only  . Latex Rash  . Spinach Hives  . Tape Other (See Comments)    PLEASE USE PAPER TAPE!! AFFECTS SKIN BADLY!!!!     Assessment/Plan:  1. Hypertension - BP in clinic was elevated at 192/78 initially and had come down to 188/82 by the end of the visit (goal < 130/80). Her home meter BP check was very close to reading in clinic by PharmD. Patient denied any symptoms of stroke including blurry vision, dizziness, etc. Patient's home BP readings are much closer to goal (140-150/70-80) and associates her high BP to white coat HTN and her salty diet from the night prior. Increase losartan to 50 mg daily. Instructed patient to take an extra 25 mg of losartan when she gets home since she took her dose around 9 AM this morning. Counseled patient on the signs and symptoms of stroke. Instructed to recheck her BP later this afternoon after taking the extra losartan and after getting home. Provided patient with phone number to call if BP remains this elevated. Follow up with HTN clinic in 3 weeks for BP check and BMET.   Vertis Kelch, PharmD PGY2 Cardiology Pharmacy Resident [facility] [id number] [place], [place] [address] Phone: [phone]; Fax: [phone]

## 2019-07-05 ENCOUNTER — Other Ambulatory Visit: Payer: Self-pay | Admitting: Internal Medicine

## 2019-07-11 ENCOUNTER — Ambulatory Visit (INDEPENDENT_AMBULATORY_CARE_PROVIDER_SITE_OTHER): Payer: Self-pay | Admitting: *Deleted

## 2019-07-11 ENCOUNTER — Other Ambulatory Visit: Payer: Self-pay

## 2019-07-11 DIAGNOSIS — I4819 Other persistent atrial fibrillation: Secondary | ICD-10-CM

## 2019-07-11 DIAGNOSIS — Z950 Presence of cardiac pacemaker: Secondary | ICD-10-CM

## 2019-07-11 DIAGNOSIS — R001 Bradycardia, unspecified: Secondary | ICD-10-CM

## 2019-07-11 LAB — CUP PACEART INCLINIC DEVICE CHECK
Battery Remaining Longevity: 145 mo
Battery Voltage: 3.1 V
Brady Statistic RA Percent Paced: 20 %
Brady Statistic RV Percent Paced: 0.14 %
Date Time Interrogation Session: 20201119100239
Implantable Lead Implant Date: 20201103
Implantable Lead Implant Date: 20201103
Implantable Lead Location: 753859
Implantable Lead Location: 753860
Implantable Pulse Generator Implant Date: 20201103
Lead Channel Impedance Value: 475 Ohm
Lead Channel Impedance Value: 662.5 Ohm
Lead Channel Pacing Threshold Amplitude: 0.625 V
Lead Channel Pacing Threshold Amplitude: 0.875 V
Lead Channel Pacing Threshold Pulse Width: 0.5 ms
Lead Channel Pacing Threshold Pulse Width: 0.5 ms
Lead Channel Sensing Intrinsic Amplitude: 12 mV
Lead Channel Sensing Intrinsic Amplitude: 5 mV
Lead Channel Setting Pacing Amplitude: 0.875
Lead Channel Setting Pacing Amplitude: 1.875
Lead Channel Setting Pacing Pulse Width: 0.5 ms
Lead Channel Setting Sensing Sensitivity: 2 mV
Pulse Gen Model: 2272
Pulse Gen Serial Number: 9174760

## 2019-07-11 NOTE — Progress Notes (Signed)
Wound check appointment. Steri-strips removed. Wound without redness or edema. Incision edges approximated, wound healing well. Normal device function. Thresholds, sensing, and impedances consistent with implant measurements. Device programmed with auto capture on for extra safety margin until 3 month visit. Histogram distribution appropriate for patient and level of activity. No mode switches or high ventricular rates noted. Patient educated about wound care, arm mobility, lifting restrictions, and Merlin monitor. ROV with Dr. Rayann Heman on 09/30/19.

## 2019-07-12 ENCOUNTER — Encounter (HOSPITAL_COMMUNITY): Payer: Self-pay | Admitting: Physician Assistant

## 2019-07-12 ENCOUNTER — Ambulatory Visit (HOSPITAL_COMMUNITY)
Admission: RE | Admit: 2019-07-12 | Discharge: 2019-07-12 | Disposition: A | Discharge status: Home / Self Care | Payer: Self-pay | Source: Ambulatory Visit | Attending: Nurse Practitioner | Admitting: Nurse Practitioner

## 2019-07-12 VITALS — BP 160/74 | HR 60 | Ht 68.0 in | Wt 198.4 lb

## 2019-07-12 DIAGNOSIS — I340 Nonrheumatic mitral (valve) insufficiency: Secondary | ICD-10-CM | POA: Insufficient documentation

## 2019-07-12 DIAGNOSIS — D6869 Other thrombophilia: Secondary | ICD-10-CM | POA: Insufficient documentation

## 2019-07-12 DIAGNOSIS — E669 Obesity, unspecified: Secondary | ICD-10-CM | POA: Insufficient documentation

## 2019-07-12 DIAGNOSIS — Z79899 Other long term (current) drug therapy: Secondary | ICD-10-CM | POA: Insufficient documentation

## 2019-07-12 DIAGNOSIS — Z951 Presence of aortocoronary bypass graft: Secondary | ICD-10-CM | POA: Insufficient documentation

## 2019-07-12 DIAGNOSIS — R9431 Abnormal electrocardiogram [ECG] [EKG]: Secondary | ICD-10-CM | POA: Insufficient documentation

## 2019-07-12 DIAGNOSIS — Z8249 Family history of ischemic heart disease and other diseases of the circulatory system: Secondary | ICD-10-CM | POA: Insufficient documentation

## 2019-07-12 DIAGNOSIS — Z7901 Long term (current) use of anticoagulants: Secondary | ICD-10-CM | POA: Insufficient documentation

## 2019-07-12 DIAGNOSIS — I25810 Atherosclerosis of coronary artery bypass graft(s) without angina pectoris: Secondary | ICD-10-CM | POA: Insufficient documentation

## 2019-07-12 DIAGNOSIS — Z8673 Personal history of transient ischemic attack (TIA), and cerebral infarction without residual deficits: Secondary | ICD-10-CM | POA: Insufficient documentation

## 2019-07-12 DIAGNOSIS — Z6830 Body mass index (BMI) 30.0-30.9, adult: Secondary | ICD-10-CM | POA: Insufficient documentation

## 2019-07-12 DIAGNOSIS — I4819 Other persistent atrial fibrillation: Secondary | ICD-10-CM | POA: Insufficient documentation

## 2019-07-12 DIAGNOSIS — Z888 Allergy status to other drugs, medicaments and biological substances status: Secondary | ICD-10-CM | POA: Insufficient documentation

## 2019-07-12 DIAGNOSIS — Z87891 Personal history of nicotine dependence: Secondary | ICD-10-CM | POA: Insufficient documentation

## 2019-07-12 DIAGNOSIS — E785 Hyperlipidemia, unspecified: Secondary | ICD-10-CM | POA: Insufficient documentation

## 2019-07-12 DIAGNOSIS — Z823 Family history of stroke: Secondary | ICD-10-CM | POA: Insufficient documentation

## 2019-07-12 DIAGNOSIS — I110 Hypertensive heart disease with heart failure: Secondary | ICD-10-CM | POA: Insufficient documentation

## 2019-07-12 DIAGNOSIS — I4892 Unspecified atrial flutter: Secondary | ICD-10-CM | POA: Insufficient documentation

## 2019-07-12 DIAGNOSIS — I4510 Unspecified right bundle-branch block: Secondary | ICD-10-CM | POA: Insufficient documentation

## 2019-07-12 DIAGNOSIS — Z91040 Latex allergy status: Secondary | ICD-10-CM | POA: Insufficient documentation

## 2019-07-12 DIAGNOSIS — I5042 Chronic combined systolic (congestive) and diastolic (congestive) heart failure: Secondary | ICD-10-CM | POA: Insufficient documentation

## 2019-07-12 DIAGNOSIS — Z881 Allergy status to other antibiotic agents status: Secondary | ICD-10-CM | POA: Insufficient documentation

## 2019-07-12 DIAGNOSIS — I429 Cardiomyopathy, unspecified: Secondary | ICD-10-CM | POA: Insufficient documentation

## 2019-07-12 DIAGNOSIS — Z950 Presence of cardiac pacemaker: Secondary | ICD-10-CM | POA: Insufficient documentation

## 2019-07-12 DIAGNOSIS — Z91018 Allergy to other foods: Secondary | ICD-10-CM | POA: Insufficient documentation

## 2019-07-12 NOTE — Progress Notes (Signed)
Primary Care Physician: [name], MD Primary Cardiologist: Dr Harrington Challenger Primary Electrophysiologist: Dr Caryl Comes Referring Physician: Dr Charma Igo Ashley is a 53 y.o. [name] with a history of atrial fibrillation, atrial flutter w/ RVR in 10/2018 with associate LV dysfunction, CAD s/p CABG 2013, MVP with severe MR s/p repair, CVA, HTN, and tobacco abuse who presents for follow up in the AF Clinic. Patient underwent DCCV in 01/2019 but had ERAF.  She developed profound bradycardia and junctional rhythm and her Digoxin and Diltiazem was DC'd. Her rate was uncontrolled again and she was symptomatic. She then saw Dr. Caryl Comes who recommended Dofetilide. She was admitted for dofetilide loading but unfortunately had QT prolongation on the full dose and breakthrough afib on lower doses. She was started on amiodarone 05/06/19. Patient underwent DCCV on 06/14/19 and had a 10 second pause followed by a junctional rhythm requiring atropine and ephedrine. She had a Zio patch placed which showed significant pauses and bradycardia. She had a PPM implanted on 06/25/19. She is on warfarin for a CHADS2VASC score of 6.  On follow up today, patient reports that she has done well since leaving the hospital. She states that she feels like she has more energy since the PPM implant. Device interrogation has shown no mode switches. She is tolerating the medication without difficulty.   Today, she denies symptoms of palpitations, chest pain, shortness of breath, orthopnea, PND, lower extremity edema, dizziness, presyncope, syncope, snoring, daytime somnolence, bleeding, or neurologic sequela. The patient is tolerating medications without difficulties and is otherwise without complaint today.    she has a BMI of Body mass index is 30.17 kg/m.[name] Filed Weights   07/12/19 1035  Weight: 90 kg    Family History  Problem Relation Age of Onset  . Hypertension Mother   . Stroke Mother   . Heart disease Father   .  COPD Father   . Anesthesia problems Neg Hx   . Hypotension Neg Hx   . Malignant hyperthermia Neg Hx   . Pseudochol deficiency Neg Hx      Atrial Fibrillation Management history:  Previous antiarrhythmic drugs: dofetilide (failed), amiodarone Previous cardioversions: 01/25/19, 06/14/19 Previous ablations: none CHADS2VASC score: 6 Anticoagulation history: warfarin   Past Medical History:  Diagnosis Date  . Abdominal pain    ? chronic cholecystitis; admxn to [facility] 9/12 (CT, USN, HIDA done)  . Atrial fibrillation ([place])   . Cardiomyopathy    echo 9/12: mild LVH, EF 35-40%, mod MR (difficult to judge /post leaflet restricted), mod BAE, mod RVE, mod TR   . Cardiomyopathy ([place])    a. echo 9/12: mild LVH, EF 35-40%, mod MR (difficult to judge /post leaflet restricted), mod BAE, mod RVE, mod TR. b. Echo 11/2011: mild LVH, EF 55-60%, s/p MV repair w/o sig MR/MS, mildly dilated RV/RA, PASP 58mmHg. // AFL dx 10/2018 - Echo 10/2018: EF 30, diff HK, s/p MV repair with trivial MR, mean MV 7 but not c/w significant mitral stenosis, mild RAE, severe LAE, mod reduced RVSF   . Carotid artery disease ([place])    a. Duplex 06/2015: 50% RECA, 1-39% BICA.  [name] Coronary artery disease    a. s/p CABG 10/2011 - LIMA-LAD, SVG-PDA, SVG-OM, SVG-diagonal.  . Dyslipidemia   . Essential hypertension   . Hemifacial spasm   . History of Doppler ultrasound    a. Carotid US [facility] in [place], [name]) 7/17: no hemodynamically significant ICA stenosis  . Mitral regurgitation  a. s/p MV repair 10/2011 - on Coumadin for 3 months afterwards then d/c'd by surgery.  . Recurrent upper respiratory infection (URI)   . 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 Stroke [facility])    a. age 39 - in setting of cigs and OCPs. //  b. s/p acute L parietal CVA >> tPA - [facility] in [place], [name]  . Tobacco abuse    Past Surgical History:  Procedure Laterality Date  . CARDIAC CATHETERIZATION    .  CARDIOVERSION N/A 01/25/2019   Procedure: CARDIOVERSION;  Surgeon: [name], MD;  Location: [facility] ENDOSCOPY;  Service: Cardiovascular;  Laterality: N/A;  . CARDIOVERSION N/A 06/14/2019   Procedure: CARDIOVERSION;  Surgeon: [name], MD;  Location: [place];  Service: Cardiovascular;  Laterality: N/A;  . CESAREAN SECTION    . cesarean section    . CORONARY ARTERY BYPASS GRAFT  10/21/2011   Procedure: CORONARY ARTERY BYPASS GRAFTING (CABG);  Surgeon: [name], MD;  Location: [place];  Service: Open Heart Surgery;  Laterality: N/A;  cabg x four, using right leg greater saphenous vein harvested endoscopically  . DENTAL SURGERY    . MITRAL VALVE REPAIR  10/21/2011   Procedure: MITRAL VALVE REPAIR (MVR);  Surgeon: [name], MD;  Location: [place];  Service: Open Heart Surgery;  Laterality: N/A;  . PACEMAKER IMPLANT N/A 06/25/2019   Procedure: PACEMAKER IMPLANT;  Surgeon: [name], MD;  Location: [place] CV LAB;  Service: Cardiovascular;  Laterality: N/A;  . TEE WITHOUT CARDIOVERSION  06/30/2011   Procedure: TRANSESOPHAGEAL ECHOCARDIOGRAM (TEE);  Surgeon: [name], MD;  Location: [facility] ENDOSCOPY;  Service: Cardiovascular;  Laterality: N/A;  . TONSILLECTOMY    . TUBAL LIGATION      Current Outpatient Medications  Medication Sig Dispense Refill  . acetaminophen (TYLENOL) 500 MG tablet Take 500-1,000 mg by mouth every 6 (six) hours as needed for headache.    [name] amiodarone (PACERONE) 200 MG tablet Take 1 tablet (200 mg total) by mouth daily. 90 tablet 3  . amLODipine (NORVASC) 5 MG tablet Take 5 mg by mouth daily.    [name] atorvastatin (LIPITOR) 80 MG tablet Take 1 tablet (80 mg total) by mouth daily at 6 PM. 90 tablet 3  . diphenhydrAMINE (BENADRYL) 25 MG tablet Take 25 mg by mouth every 6 (six) hours as needed for allergies.    . famotidine (PEPCID) 20 MG tablet Take 20 mg by mouth daily as needed for indigestion.     . furosemide (LASIX) 20 MG tablet TAKE 1 TABLET BY MOUTH  AS NEEDED FOR  SWELLING (Patient taking differently: Take 20 mg by mouth daily as needed for edema. ) 90 tablet 2  . losartan (COZAAR) 50 MG tablet Take 1 tablet (50 mg total) by mouth daily. 90 tablet 3  . Saline 0.2 % SOLN Place 1 spray into both nostrils as needed (for congestion).     . vitamin B-12 (CYANOCOBALAMIN) 1000 MCG tablet Take 1 tablet (1,000 mcg total) by mouth daily.    . vitamin C (ASCORBIC ACID) 500 MG tablet Take 1,000 mg by mouth 2 (two) times daily.     [name] warfarin (COUMADIN) 5 MG tablet Take 2.5 mg by mouth in the evening on Sun/Wed/Fri and 5 mg on Mon/Tues/Thurs/Sat 30 tablet 1   Current Facility-Administered Medications  Medication Dose Route Frequency Provider Last Rate Last Dose  . incobotulinumtoxinA (XEOMIN) 50 units injection 50  Units  50 Units Intramuscular Q90 days Marcial Pacas, MD   50 Units at 11/30/16 1546  . incobotulinumtoxinA (XEOMIN) 50 units injection 50 Units  50 Units Intramuscular Q90 days Marcial Pacas, MD   50 Units at 07/25/17 [phone]  . incobotulinumtoxinA (XEOMIN) 50 units injection 50 Units  50 Units Intramuscular Q90 days Marcial Pacas, MD   50 Units at 07/26/17 1217    Allergies  Allergen Reactions  . Azithromycin Nausea Only  . Latex Rash  . Spinach Hives  . Tape Other (See Comments)    PLEASE USE PAPER TAPE!! AFFECTS SKIN BADLY!!!!    Social History   Socioeconomic History  . Marital status: Married    Spouse name: Not on file  . Number of children: 42  . Years of education: 2  . Highest education level: Not on file  Occupational History  . 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  Social Needs  . Financial resource strain: Not on file  . Food insecurity    Worry: Not on file    Inability: Not on file  . Transportation needs    Medical: Not on file    Non-medical: Not on file  Tobacco Use  . Smoking status: Former Smoker    Packs/day: 1.00    Years: 30.00    Pack years: 30.00    Quit date:  05/07/2011    Years since quitting: 8.1  . Smokeless tobacco: Never Used  Substance and Sexual Activity  . Alcohol use: Yes    Alcohol/week: 1.0 standard drinks    Types: 1 Glasses of wine per week    Comment: 1 glass of red wine nightly  . Drug use: No  . Sexual activity: Yes  Lifestyle  . Physical activity    Days per week: Not on file    Minutes per session: Not on file  . Stress: Not on file  Relationships  . Social Herbalist on phone: Not on file    Gets together: Not on file    Attends religious service: Not on file    Active member of club or organization: Not on file    Attends meetings of clubs or organizations: Not on file    Relationship status: Not on file  . Intimate partner violence    Fear of current or ex partner: Not on file    Emotionally abused: Not on file    Physically abused: Not on file    Forced sexual activity: Not on file  Other Topics Concern  . Not on file  Social History Narrative   Lives at home with husband and son.   Right-handed.   2 cups caffeine daily.     ROS- All systems are reviewed and negative except as per the HPI above.  Physical Exam: Vitals:   07/12/19 1035  BP: (!) 160/74  Pulse: 60  Weight: 90 kg  Height: 5\' 8"  (1.727 m)    GEN- The patient is well appearing obese [name], alert and oriented x 3 today.   HEENT-head normocephalic, atraumatic, sclera clear, conjunctiva pink, hearing intact, trachea midline. Right ptosis  Lungs- Clear to ausculation bilaterally, normal work of breathing Heart- Regular rate and rhythm, no murmurs, rubs or gallops  GI- soft, NT, ND, + BS Extremities- no clubbing, cyanosis, or edema MS- no significant deformity or atrophy Skin- no rash or lesion Psych- euthymic mood, full affect Neuro- strength and sensation are intact   Wt Readings  from Last 3 Encounters:  07/12/19 90 kg  06/26/19 88.8 kg  06/25/19 90.3 kg    EKG today demonstrates A-paced rhythm with prolonged  conduction HR 60, inc RBBB, PR 218, QRS 114, QTc 466  Echo 03/05/19 demonstrated  1. The left ventricle has normal systolic function, with an ejection fraction of 60-65%. The cavity size was normal.  2. The right ventricle has normal systolc function. The cavity was normal. There is no increase in right ventricular wall thickness.  3. Aortic valve regurgitation was not assessed by color flow Doppler.  4. Pulmonic valve regurgitation was not assessed by color flow Doppler.  Epic records are reviewed at length today  Assessment and Plan:  1. Persistent atrial fibrillation/atrial flutter  Patient failed dofetilide 2/2 QT prolongation and was started on amiodarone 05/06/19. Per Dr Rayann Heman, with substantial atriopathy s/p prior valve repair, she is thought to be a poor ablation candidate. PPM interrogation 11/19 showed no mode switches or ventricular high rates. Continue amiodarone 200 mg daily. PFTs scheduled.  Continue warfarin Recent TSH and LFTs reviewed.    This patients CHA2DS2-VASc Score and unadjusted Ischemic Stroke Rate (% per year) is equal to 9.7 % stroke rate/year from a score of 6  Above score calculated as 1 point each if present [CHF, HTN, DM, Vascular=MI/PAD/Aortic Plaque, Age if 65-74, or [name]] Above score calculated as 2 points each if present [Age > 75, or Stroke/TIA/TE]   2. Obesity Body mass index is 30.17 kg/m. Lifestyle modification was discussed and encouraged including regular physical activity and weight reduction.  3. CAD S/p CAGB.  No anginal symptoms.  4. Combined chronic systolic/diastolic CHF Recent echo shows EF 60-65% No signs or symptoms of fluid overload. Coreg d/c due to symptomatic bradycardia. She is on ARB.  5. MVP with MR S/p repair 2013.  6. Sinus node dysfunction Pauses up to 10 seconds in setting of DCCV/post termination.  S/p PPM by Dr Rayann Heman. Followed by Dr Caryl Comes and the device clinic.  7. HTN Elevated today. Followed in the HTN  clinic.   Follow up with Dr Caryl Comes as scheduled.    [facility] [address] [place], [place] [address] [phone] 07/12/2019 10:54 AM

## 2019-07-22 ENCOUNTER — Ambulatory Visit: Payer: Self-pay | Attending: Family Medicine | Admitting: Family Medicine

## 2019-07-22 ENCOUNTER — Other Ambulatory Visit: Payer: Self-pay

## 2019-07-22 ENCOUNTER — Encounter: Payer: Self-pay | Admitting: Family Medicine

## 2019-07-22 DIAGNOSIS — I2510 Atherosclerotic heart disease of native coronary artery without angina pectoris: Secondary | ICD-10-CM

## 2019-07-22 DIAGNOSIS — I495 Sick sinus syndrome: Secondary | ICD-10-CM

## 2019-07-22 DIAGNOSIS — I4819 Other persistent atrial fibrillation: Secondary | ICD-10-CM

## 2019-07-22 NOTE — Progress Notes (Signed)
Virtual Visit via Telephone Note  I connected with [place], on 07/22/2019 at 2:33 PM by telephone due to the COVID-19 pandemic and verified that I am speaking with the correct person using two identifiers.   Consent: I discussed the limitations, risks, security and privacy concerns of performing an evaluation and management service by telephone and the availability of in person appointments. I also discussed with the patient that there may be a patient responsible charge related to this service. The patient expressed understanding and agreed to proceed.   Location of Patient: Home  Location of Provider: Clinic   Persons participating in Telemedicine visit: Shahd Venzor Funaro Ozzie Hoyle Dr. Margarita Rana     History of Present Illness: Ashley [name] is a 53 year old female with medical history significant for  hypertension,previous CVA CAD status post CABG, MVP s/p mitral valve repair, atrial fibrillation failedTikosyn treatmentdue to prolonged QTC, hospitalized in 05/2019 for DCCV with period of asystole, junctional rhythm in the 20s which was treated with atropine and ephedrine and dopamine for hypotension.  She recently had a pacemaker placed as her Zio patch had showed sinus pauses and bradycardia.   She states she has more energy now ever since placement of her pacemaker and is compliant on Coumadin.  She had a visit at A. fib clinic 10 days ago when device was interrogated with no switches noted. She denies presence of chest pain, dyspnea, palpitations. Denies additional concerns today. Past Medical History:  Diagnosis Date  . Abdominal pain    ? chronic cholecystitis; admxn to [facility] 9/12 (CT, USN, HIDA done)  . Atrial fibrillation ([place])   . Cardiomyopathy    echo 9/12: mild LVH, EF 35-40%, mod MR (difficult to judge /post leaflet restricted), mod BAE, mod RVE, mod TR   . Cardiomyopathy ([place])    a. echo 9/12: mild LVH, EF 35-40%, mod MR (difficult to  judge /post leaflet restricted), mod BAE, mod RVE, mod TR. b. Echo 11/2011: mild LVH, EF 55-60%, s/p MV repair w/o sig MR/MS, mildly dilated RV/RA, PASP 61mmHg. // AFL dx 10/2018 - Echo 10/2018: EF 30, diff HK, s/p MV repair with trivial MR, mean MV 7 but not c/w significant mitral stenosis, mild RAE, severe LAE, mod reduced RVSF   . Carotid artery disease ([place])    a. Duplex 06/2015: 50% RECA, 1-39% BICA.  [name] Coronary artery disease    a. s/p CABG 10/2011 - LIMA-LAD, SVG-PDA, SVG-OM, SVG-diagonal.  . Dyslipidemia   . Essential hypertension   . Hemifacial spasm   . History of Doppler ultrasound    a. Carotid US [facility] in [place], [name]) 7/17: no hemodynamically significant ICA stenosis  . Mitral regurgitation    a. s/p MV repair 10/2011 - on Coumadin for 3 months afterwards then d/c'd by surgery.  . Recurrent upper respiratory infection (URI)   . 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 Stroke [facility])    a. age 38 - in setting of cigs and OCPs. //  b. s/p acute L parietal CVA >> tPA - [facility] in [place], [name]  . Tobacco abuse    Allergies  Allergen Reactions  . Azithromycin Nausea Only  . Latex Rash  . Spinach Hives  . Tape Other (See Comments)    PLEASE USE PAPER TAPE!! AFFECTS SKIN BADLY!!!!    Current Outpatient Medications on File Prior to Visit  Medication Sig Dispense Refill  . acetaminophen (TYLENOL)  500 MG tablet Take 500-1,000 mg by mouth every 6 (six) hours as needed for headache.    [name] amiodarone (PACERONE) 200 MG tablet Take 1 tablet (200 mg total) by mouth daily. 90 tablet 3  . amLODipine (NORVASC) 5 MG tablet Take 5 mg by mouth daily.    [name] atorvastatin (LIPITOR) 80 MG tablet Take 1 tablet (80 mg total) by mouth daily at 6 PM. 90 tablet 3  . diphenhydrAMINE (BENADRYL) 25 MG tablet Take 25 mg by mouth every 6 (six) hours as needed for allergies.    . famotidine (PEPCID) 20 MG tablet Take 20 mg by mouth daily as needed for indigestion.      . furosemide (LASIX) 20 MG tablet TAKE 1 TABLET BY MOUTH AS NEEDED FOR  SWELLING (Patient taking differently: Take 20 mg by mouth daily as needed for edema. ) 90 tablet 2  . losartan (COZAAR) 50 MG tablet Take 1 tablet (50 mg total) by mouth daily. 90 tablet 3  . Saline 0.2 % SOLN Place 1 spray into both nostrils as needed (for congestion).     . vitamin B-12 (CYANOCOBALAMIN) 1000 MCG tablet Take 1 tablet (1,000 mcg total) by mouth daily.    . vitamin C (ASCORBIC ACID) 500 MG tablet Take 1,000 mg by mouth 2 (two) times daily.     [name] warfarin (COUMADIN) 5 MG tablet Take 2.5 mg by mouth in the evening on Sun/Wed/Fri and 5 mg on Mon/Tues/Thurs/Sat 30 tablet 1   Current Facility-Administered Medications on File Prior to Visit  Medication Dose Route Frequency Provider Last Rate Last Dose  . incobotulinumtoxinA (XEOMIN) 50 units injection 50 Units  50 Units Intramuscular Q90 days Marcial Pacas, MD   50 Units at 11/30/16 1546  . incobotulinumtoxinA (XEOMIN) 50 units injection 50 Units  50 Units Intramuscular Q90 days Marcial Pacas, MD   50 Units at 07/25/17 [phone]  . incobotulinumtoxinA (XEOMIN) 50 units injection 50 Units  50 Units Intramuscular Q90 days Marcial Pacas, MD   50 Units at 07/26/17 1217    Observations/Objective: Awake, alert, oriented x3 Not in acute distress  CMP Latest Ref Rng & Units 06/26/2019 06/25/2019 06/19/2019  Glucose 70 - 99 mg/dL 117(H) 151(H) -  BUN 6 - 20 mg/dL 16 14 -  Creatinine 0.44 - 1.00 mg/dL 0.83 1.01(H) -  Sodium 135 - 145 mmol/L 139 138 -  Potassium 3.5 - 5.1 mmol/L 3.8 4.9 -  Chloride 98 - 111 mmol/L 107 102 -  CO2 22 - 32 mmol/L 22 22 -  Calcium 8.9 - 10.3 mg/dL 9.4 9.6 -  Total Protein 6.0 - 8.5 g/dL - - 7.1  Total Bilirubin 0.0 - 1.2 mg/dL - - 1.8(H)  Alkaline Phos 39 - 117 IU/L - - 212(H)  AST 0 - 40 IU/L - - 17  ALT 0 - 32 IU/L - - 16    Lipid Panel     Component Value Date/Time   CHOL 114 11/16/2017 0838   TRIG 68 11/16/2017 0838   HDL 43 11/16/2017  0838   CHOLHDL 2.7 11/16/2017 0838   CHOLHDL 3.8 07/01/2016 0939   VLDL 25 07/01/2016 0939   LDLCALC 57 11/16/2017 0838   LABVLDL 14 11/16/2017 0838     Assessment and Plan: 1. Sinus node dysfunction (HCC) Status post pacemaker placement Doing well   2. Other persistent atrial fibrillation ([place]) Currently on anticoagulation with Coumadin Followed by the Coumadin clinic Last INR was 2.4 on 07/01/2019 Followed by A. fib clinic  3. Coronary artery disease involving native coronary artery of native heart without angina pectoris Status post CABG Stable  Follow Up Instructions: 3 months   I discussed the assessment and treatment plan with the patient. The patient was provided an opportunity to ask questions and all were answered. The patient agreed with the plan and demonstrated an understanding of the instructions.   The patient was advised to call back or seek an in-person evaluation if the symptoms worsen or if the condition fails to improve as anticipated.     I provided 17 minutes total of non-face-to-face time during this encounter including median intraservice time, reviewing previous notes, labs, imaging, medications, management and patient verbalized understanding.     [name], MD, FAAFP. [facility] and [place], [place]   07/22/2019, 2:33 PM

## 2019-07-22 NOTE — Progress Notes (Signed)
Patient has been called and DOB has been verified. Patient has been screened and transferred to PCP to start phone visit.     

## 2019-07-23 ENCOUNTER — Telehealth: Payer: Self-pay | Admitting: Emergency Medicine

## 2019-07-23 NOTE — Telephone Encounter (Signed)
Patient reports more energy and better quality of life sine PPM insertion. Unaware of AF episodes . Has appointment with Dr Caryl Comes and Dr Rayann Heman for follow-up. Will determine which appointment patient should attend and let her know.

## 2019-07-29 ENCOUNTER — Ambulatory Visit (INDEPENDENT_AMBULATORY_CARE_PROVIDER_SITE_OTHER): Payer: Self-pay | Admitting: Pharmacist

## 2019-07-29 ENCOUNTER — Other Ambulatory Visit: Payer: Self-pay

## 2019-07-29 ENCOUNTER — Ambulatory Visit (INDEPENDENT_AMBULATORY_CARE_PROVIDER_SITE_OTHER): Payer: Self-pay | Admitting: *Deleted

## 2019-07-29 ENCOUNTER — Other Ambulatory Visit (HOSPITAL_COMMUNITY): Payer: Self-pay

## 2019-07-29 ENCOUNTER — Other Ambulatory Visit (HOSPITAL_COMMUNITY)
Admission: RE | Admit: 2019-07-29 | Discharge: 2019-07-29 | Disposition: A | Discharge status: Home / Self Care | Payer: Self-pay | Source: Ambulatory Visit | Attending: Internal Medicine | Admitting: Internal Medicine

## 2019-07-29 VITALS — BP 138/76 | HR 94

## 2019-07-29 DIAGNOSIS — Z01812 Encounter for preprocedural laboratory examination: Secondary | ICD-10-CM | POA: Insufficient documentation

## 2019-07-29 DIAGNOSIS — Z20828 Contact with and (suspected) exposure to other viral communicable diseases: Secondary | ICD-10-CM | POA: Insufficient documentation

## 2019-07-29 DIAGNOSIS — I10 Essential (primary) hypertension: Secondary | ICD-10-CM

## 2019-07-29 DIAGNOSIS — Z5181 Encounter for therapeutic drug level monitoring: Secondary | ICD-10-CM

## 2019-07-29 DIAGNOSIS — I4891 Unspecified atrial fibrillation: Secondary | ICD-10-CM

## 2019-07-29 DIAGNOSIS — Z8673 Personal history of transient ischemic attack (TIA), and cerebral infarction without residual deficits: Secondary | ICD-10-CM

## 2019-07-29 LAB — BASIC METABOLIC PANEL
BUN/Creatinine Ratio: 12 (ref 9–23)
BUN: 11 mg/dL (ref 6–24)
CO2: 25 mmol/L (ref 20–29)
Calcium: 9.3 mg/dL (ref 8.7–10.2)
Chloride: 100 mmol/L (ref 96–106)
Creatinine, Ser: 0.9 mg/dL (ref 0.57–1.00)
GFR calc Af Amer: 84 mL/min/{1.73_m2} (ref >59)
GFR calc non Af Amer: 73 mL/min/{1.73_m2} (ref >59)
Glucose: 93 mg/dL (ref 65–99)
Potassium: 3.9 mmol/L (ref 3.5–5.2)
Sodium: 141 mmol/L (ref 134–144)

## 2019-07-29 LAB — POCT INR: INR: 3 (ref 2.0–3.0)

## 2019-07-29 MED ORDER — AMLODIPINE BESYLATE 10 MG PO TABS: 10.0000 mg | ORAL_TABLET | Freq: Every day | ORAL | 3 refills | Status: DC

## 2019-07-29 NOTE — Progress Notes (Addendum)
Patient ID: Ashley [name]                 DOB: 1966/06/02                      MRN: [id number]     HPI: Ashley [name] is a 53 y.o. female referred by Dr. Caryl Comes to HTN clinic. PMH is significant for CAD, VHD, (CABG, MV repair, 2013), ICM, HTN, HLD, stroke (at 53 y/o in setting of tobacco use and OBC) another stroke 2017, and paroxysmal AFib. Patient was last seen in HTN clinic on 07/04/19. At this visit, BP was very elevated at 188/82, however home readings ranged 140-150/70-80. Pt attributed this to white coat HTN and salty diet. Losartan was increased to 50mg  daily. She was seen in afib clinic 1 week later and BP was 160/74, no med changes made. Pt presents today for follow up.  Pt presents today in good spirits. Pt denies dizziness, falls, headache, or vision changes. She is tolerating higher dose of losartan well. Had swelling in her legs 1 night after eating salty food - took Lasix then (averages once a week). Felt some afib over the weekend. Usually takes her meds at 10am - she took them earlier this morning 2 hours ago at 6:30am Home BP cuff previously brought to clinic and matched clinic readings well. Reports she and her family all experience white coat hypertension.  Current HTN meds: amlodipine 5 mg daily, losartan 50 mg daily, furosemide 20 mg PRN edema   Previously tried: carvedilol (bradycardia), diltiazem (stopped for cardioversion, discharged with amiodarone following cardioversion), hydrochlorothiazide (stopped at hospital discharge 10/2011, no intolerance noted), lisinopril (held while titrating up on rate-controlling medications to avoid hypotension), metoprolol (stopped at hospital discharge 10/2011 and switched back to PTA carvedilol)  BP goal: <130/4mmHg  Family History: COPD in her father; Heart disease in her father; Hypertension in her mother; Stroke in her mother.  Social History: former smoker - quit about 7 years ago; red wine 2 glasses per day  Diet: does  not cook with salt, bake and grilled meats, fried once every 3 months, fruits and vegetables, limits greens with coumadin   Exercise: more exercise since PPM  Home BP readings: 125/65 - 183/90, HR 59 - 77. Almost all readings above goal although readings have improved the past few days [id number] systolic.  Wt Readings from Last 3 Encounters:  07/12/19 198 lb 6.4 oz (90 kg)  06/26/19 195 lb 11.2 oz (88.8 kg)  06/25/19 199 lb (90.3 kg)   BP Readings from Last 3 Encounters:  07/12/19 (!) 160/74  07/04/19 (!) 188/82  06/26/19 (!) 170/83   Pulse Readings from Last 3 Encounters:  07/12/19 60  07/04/19 69  06/26/19 75    Renal function: CrCl cannot be calculated (Patient's most recent lab result is older than the maximum 21 days allowed.).  Past Medical History:  Diagnosis Date  . Abdominal pain    ? chronic cholecystitis; admxn to [facility] 9/12 (CT, USN, HIDA done)  . Atrial fibrillation ([place])   . Cardiomyopathy    echo 9/12: mild LVH, EF 35-40%, mod MR (difficult to judge /post leaflet restricted), mod BAE, mod RVE, mod TR   . Cardiomyopathy ([place])    a. echo 9/12: mild LVH, EF 35-40%, mod MR (difficult to judge /post leaflet restricted), mod BAE, mod RVE, mod TR. b. Echo 11/2011: mild LVH, EF 55-60%, s/p MV repair w/o sig MR/MS, mildly dilated RV/RA, PASP 33mmHg. //  AFL dx 10/2018 - Echo 10/2018: EF 30, diff HK, s/p MV repair with trivial MR, mean MV 7 but not c/w significant mitral stenosis, mild RAE, severe LAE, mod reduced RVSF   . Carotid artery disease ([place])    a. Duplex 06/2015: 50% RECA, 1-39% BICA.  [name] Coronary artery disease    a. s/p CABG 10/2011 - LIMA-LAD, SVG-PDA, SVG-OM, SVG-diagonal.  . Dyslipidemia   . Essential hypertension   . Hemifacial spasm   . History of Doppler ultrasound    a. Carotid US [facility] in [place], [name]) 7/17: no hemodynamically significant ICA stenosis  . Mitral regurgitation    a. s/p MV repair 10/2011 - on Coumadin for 3 months afterwards  then d/c'd by surgery.  . Recurrent upper respiratory infection (URI)   . 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 Stroke [facility])    a. age 9 - in setting of cigs and OCPs. //  b. s/p acute L parietal CVA >> tPA - [facility] in [place], [name]  . Tobacco abuse     Current Outpatient Medications on File Prior to Visit  Medication Sig Dispense Refill  . acetaminophen (TYLENOL) 500 MG tablet Take 500-1,000 mg by mouth every 6 (six) hours as needed for headache.    [name] amiodarone (PACERONE) 200 MG tablet Take 1 tablet (200 mg total) by mouth daily. 90 tablet 3  . amLODipine (NORVASC) 5 MG tablet Take 5 mg by mouth daily.    [name] atorvastatin (LIPITOR) 80 MG tablet Take 1 tablet (80 mg total) by mouth daily at 6 PM. 90 tablet 3  . diphenhydrAMINE (BENADRYL) 25 MG tablet Take 25 mg by mouth every 6 (six) hours as needed for allergies.    . famotidine (PEPCID) 20 MG tablet Take 20 mg by mouth daily as needed for indigestion.     . furosemide (LASIX) 20 MG tablet TAKE 1 TABLET BY MOUTH AS NEEDED FOR  SWELLING (Patient taking differently: Take 20 mg by mouth daily as needed for edema. ) 90 tablet 2  . losartan (COZAAR) 50 MG tablet Take 1 tablet (50 mg total) by mouth daily. 90 tablet 3  . Saline 0.2 % SOLN Place 1 spray into both nostrils as needed (for congestion).     . vitamin B-12 (CYANOCOBALAMIN) 1000 MCG tablet Take 1 tablet (1,000 mcg total) by mouth daily.    . vitamin C (ASCORBIC ACID) 500 MG tablet Take 1,000 mg by mouth 2 (two) times daily.     [name] warfarin (COUMADIN) 5 MG tablet Take 2.5 mg by mouth in the evening on Sun/Wed/Fri and 5 mg on Mon/Tues/Thurs/Sat 30 tablet 1   Current Facility-Administered Medications on File Prior to Visit  Medication Dose Route Frequency Provider Last Rate Last Dose  . incobotulinumtoxinA (XEOMIN) 50 units injection 50 Units  50 Units Intramuscular Q90 days Marcial Pacas, MD   50 Units at 11/30/16 1546  . incobotulinumtoxinA (XEOMIN) 50  units injection 50 Units  50 Units Intramuscular Q90 days Marcial Pacas, MD   50 Units at 07/25/17 [phone]  . incobotulinumtoxinA (XEOMIN) 50 units injection 50 Units  50 Units Intramuscular Q90 days Marcial Pacas, MD   50 Units at 07/26/17 1217    Allergies  Allergen Reactions  . Azithromycin Nausea Only  . Latex Rash  . Spinach Hives  . Tape Other (See Comments)    PLEASE USE PAPER TAPE!! AFFECTS SKIN BADLY!!!!  Assessment/Plan:  1. Hypertension - BP improved today however remains above goal <130/10mmHg in clinic and at home. Will increase amlodipine to 10mg  daily and continue losartan 50mg  daily and furosemide prn. Counseled pt to monitor for any LEE on higher dose of amlodipine. Checking BMET today with recent dose increase of losartan. Pt will continue to monitor BP at home and bring readings to next visit in 6 weeks - same day as INR check.  Aaryan Essman E. Schylar Wuebker, PharmD, BCACP, [place] [id number], [place], [place] [address] Phone: [phone]; Fax: [phone] 07/29/2019 8:57 AM

## 2019-07-29 NOTE — Patient Instructions (Addendum)
It was nice to see you today  Your blood pressure goal is less than 130/19mmHg  INCREASE your amlodipine to 10mg  daily. Monitor for any swelling in your legs  Continue taking losartan 50mg  daily and furosemide as needed for edema  Monitor your blood pressure at home and bring your readings to your next visit on January 18th at 10:15am (same day as your Coumadin check)

## 2019-07-29 NOTE — Patient Instructions (Signed)
Description   Continue same dosage 1 tablet daily except for 1/2 tablet on Sundays, Wednesdays, and Fridays. Call Coumadin Clinic [phone] if you have any changes in your medication or upcoming procedures.  Recheck INR in 6 weeks. Pt stated that she is waiting on financial advisor to review some things before she bring paper work for [place] back.

## 2019-07-30 LAB — NOVEL CORONAVIRUS, NAA (HOSP ORDER, SEND-OUT TO REF LAB; TAT 18-24 HRS): SARS-CoV-2, NAA: NOT DETECTED

## 2019-08-01 ENCOUNTER — Ambulatory Visit (INDEPENDENT_AMBULATORY_CARE_PROVIDER_SITE_OTHER): Payer: Self-pay | Admitting: Internal Medicine

## 2019-08-01 ENCOUNTER — Encounter: Payer: Self-pay | Admitting: *Deleted

## 2019-08-01 ENCOUNTER — Other Ambulatory Visit: Payer: Self-pay

## 2019-08-01 DIAGNOSIS — I4819 Other persistent atrial fibrillation: Secondary | ICD-10-CM

## 2019-08-01 DIAGNOSIS — R0602 Shortness of breath: Secondary | ICD-10-CM

## 2019-08-01 LAB — PULMONARY FUNCTION TEST
DL/VA % pred: 114 %
DL/VA: 4.76 ml/min/mmHg/L
DLCO unc % pred: 78 %
DLCO unc: 18.6 ml/min/mmHg
FEF 25-75 Post: 2.1 L/sec
FEF 25-75 Pre: 1.51 L/sec
FEF2575-%Change-Post: 39 %
FEF2575-%Pred-Post: 73 %
FEF2575-%Pred-Pre: 53 %
FEV1-%Change-Post: 11 %
FEV1-%Pred-Post: 70 %
FEV1-%Pred-Pre: 62 %
FEV1-Post: 2.18 L
FEV1-Pre: 1.95 L
FEV1FVC-%Change-Post: -1 %
FEV1FVC-%Pred-Pre: 92 %
FEV6-%Change-Post: 9 %
FEV6-%Pred-Post: 74 %
FEV6-%Pred-Pre: 68 %
FEV6-Post: 2.87 L
FEV6-Pre: 2.63 L
FEV6FVC-%Change-Post: 0 %
FEV6FVC-%Pred-Post: 103 %
FEV6FVC-%Pred-Pre: 102 %
FVC-%Change-Post: 13 %
FVC-%Pred-Post: 76 %
FVC-%Pred-Pre: 67 %
FVC-Post: 3.02 L
FVC-Pre: 2.65 L
Post FEV1/FVC ratio: 72 %
Post FEV6/FVC ratio: 100 %
Pre FEV1/FVC ratio: 74 %
Pre FEV6/FVC Ratio: 99 %
RV % pred: 155 %
RV: 3.21 L
TLC % pred: 96 %
TLC: 5.48 L

## 2019-08-01 NOTE — Telephone Encounter (Signed)
Phone note opened in error.  

## 2019-08-01 NOTE — Progress Notes (Signed)
Full PFT performed today. °

## 2019-08-01 NOTE — Telephone Encounter (Deleted)
-----   Message from [name], MD sent at 07/19/2019  4:38 PM EST ----- Regarding: RE: f/u plan If she prefers to see Dr Caryl Comes, I am happy for him to see her.   Ok to cancel follow-up with me if she prefers and keep follow-up with Dr Caryl Comes.    ----- Message ----- From: [name], MD Sent: 07/11/2019  12:45 PM EST To: [name], MD, Mechele Dawley, RN Subject: RE: f/u plan                                   Jeneen Rinks whatever you want to do, but I am glad to see her and reprogram her and we can skip the billing if that's better for you Thanks SK    ----- Message ----- From: Mechele Dawley, RN Sent: 07/11/2019  10:06 AM EST To: [name], MD, [name], MD Subject: f/u plan                                       Dr. Rayann Heman and Dr. Caryl Comes--  Ms. Mrozek is scheduled for f/u with Dr. Caryl Comes on 09/18/19 (71m f/u) and with Dr. Rayann Heman on 09/30/19 (91 day post-PPM implant). She would prefer to continue following with Dr. Caryl Comes long term, but is aware she may need to keep her 9 day f/u with Dr. Rayann Heman. Should I cancel 1/27 appointment with Dr. Caryl Comes?  Thanks, Raquel Sarna

## 2019-08-30 LAB — FECAL OCCULT BLOOD, IMMUNOCHEMICAL

## 2019-09-08 NOTE — Progress Notes (Signed)
Patient ID: Ashley [name]                 DOB: 11/17/1965                      MRN: [id number]     HPI: Dejon Kassebaum Burnham is a 54 y.o. female referred by Dr. Caryl Comes to HTN clinic. PMH is significant for CAD, VHD, (CABG, MV repair, 2013), ICM, HTN, HLD, stroke (at 54 y/o in setting of tobacco use and OBC) another stroke 2017, and paroxysmal AFib.  At last visit in HTN clinic on 07/29/19, patient reported tolerating losartan 50 mg and takes furosemide once weekly on average. Most BP readings were systolic [id number] so amlodipine was increased to 10 mg and patient was counseled to monitor for edema. BMET normal except glucose elevated at 117.  Patient arrives to HTN clinic for follow-up. Patient reports she is doing well and tries to go on walks to stay active. She cooks at home most of the time, but sometimes food is high in sodium. She uses her Lasix 2-3x per week as she notes some LEE on days where she eats more salt. She denies dizziness, shortness of breath, or headaches. She also states she is taking medications as instructed and checks her BP and HR three times a day. The patient brought home BP and HR readings to clinic and it showed large fluctuation in BP with half of them above goal. HR readings have improved. BP in clinic 134/64 and HR 61.   Current HTN meds: amlodipine 10 mg daily, losartan 50 mg daily, furosemide 20 mg PRN edema   Previously tried: carvedilol (bradycardia), diltiazem (stopped for cardioversion, discharged with amiodarone following cardioversion), hydrochlorothiazide (stopped at hospital discharge 10/2011, no intolerance noted), lisinopril (held while titrating up on rate-controlling medications to avoid hypotension), metoprolol (stopped at hospital discharge 10/2011 and switched back to PTA carvedilol)  BP goal: <130/48mmHg  Family History: COPD in her father; Heart disease in her father; Hypertension in her mother; Stroke in her mother.  Social History:  former smoker - quit about 7 years ago; red wine 2 glasses per day  Diet: does not cook with salt, bake and grilled meats, fried once every 3 months, fruits and vegetables, limits greens with coumadin. No sodas, 16 oz sweet tea, half and half coffee  Exercise: more exercise since PPM  Home BP readings: BP 117/60-165/83 with most readings systolic [id number]. HR 70's-80's, then decreases to 60's in the past 2 weeks  Wt Readings from Last 3 Encounters:  07/12/19 198 lb 6.4 oz (90 kg)  06/26/19 195 lb 11.2 oz (88.8 kg)  06/25/19 199 lb (90.3 kg)   BP Readings from Last 3 Encounters:  07/29/19 138/76  07/12/19 (!) 160/74  07/04/19 (!) 188/82   Pulse Readings from Last 3 Encounters:  07/29/19 94  07/12/19 60  07/04/19 69    Renal function: CrCl cannot be calculated (Patient's most recent lab result is older than the maximum 21 days allowed.).  Past Medical History:  Diagnosis Date  . Abdominal pain    ? chronic cholecystitis; admxn to [facility] 9/12 (CT, USN, HIDA done)  . Atrial fibrillation ([place])   . Cardiomyopathy    echo 9/12: mild LVH, EF 35-40%, mod MR (difficult to judge /post leaflet restricted), mod BAE, mod RVE, mod TR   . Cardiomyopathy ([place])    a. echo 9/12: mild LVH, EF 35-40%, mod MR (difficult to judge /post leaflet restricted), mod  BAE, mod RVE, mod TR. b. Echo 11/2011: mild LVH, EF 55-60%, s/p MV repair w/o sig MR/MS, mildly dilated RV/RA, PASP 38mmHg. // AFL dx 10/2018 - Echo 10/2018: EF 30, diff HK, s/p MV repair with trivial MR, mean MV 7 but not c/w significant mitral stenosis, mild RAE, severe LAE, mod reduced RVSF   . Carotid artery disease ([place])    a. Duplex 06/2015: 50% RECA, 1-39% BICA.  [name] Coronary artery disease    a. s/p CABG 10/2011 - LIMA-LAD, SVG-PDA, SVG-OM, SVG-diagonal.  . Dyslipidemia   . Essential hypertension   . Hemifacial spasm   . History of Doppler ultrasound    a. Carotid US [facility] in [place], [name]) 7/17: no hemodynamically  significant ICA stenosis  . Mitral regurgitation    a. s/p MV repair 10/2011 - on Coumadin for 3 months afterwards then d/c'd by surgery.  . Recurrent upper respiratory infection (URI)   . 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 Stroke [facility])    a. age 18 - in setting of cigs and OCPs. //  b. s/p acute L parietal CVA >> tPA - [facility] in [place], [name]  . Tobacco abuse     Current Outpatient Medications on File Prior to Visit  Medication Sig Dispense Refill  . acetaminophen (TYLENOL) 500 MG tablet Take 500-1,000 mg by mouth every 6 (six) hours as needed for headache.    [name] amiodarone (PACERONE) 200 MG tablet Take 1 tablet (200 mg total) by mouth daily. 90 tablet 3  . amLODipine (NORVASC) 10 MG tablet Take 1 tablet (10 mg total) by mouth daily. 90 tablet 3  . atorvastatin (LIPITOR) 80 MG tablet Take 1 tablet (80 mg total) by mouth daily at 6 PM. 90 tablet 3  . diphenhydrAMINE (BENADRYL) 25 MG tablet Take 25 mg by mouth every 6 (six) hours as needed for allergies.    . famotidine (PEPCID) 20 MG tablet Take 20 mg by mouth daily as needed for indigestion.     . furosemide (LASIX) 20 MG tablet TAKE 1 TABLET BY MOUTH AS NEEDED FOR  SWELLING (Patient taking differently: Take 20 mg by mouth daily as needed for edema. ) 90 tablet 2  . losartan (COZAAR) 50 MG tablet Take 1 tablet (50 mg total) by mouth daily. 90 tablet 3  . Saline 0.2 % SOLN Place 1 spray into both nostrils as needed (for congestion).     . vitamin B-12 (CYANOCOBALAMIN) 1000 MCG tablet Take 1 tablet (1,000 mcg total) by mouth daily.    . vitamin C (ASCORBIC ACID) 500 MG tablet Take 1,000 mg by mouth 2 (two) times daily.     [name] warfarin (COUMADIN) 5 MG tablet Take 2.5 mg by mouth in the evening on Sun/Wed/Fri and 5 mg on Mon/Tues/Thurs/Sat 30 tablet 1   Current Facility-Administered Medications on File Prior to Visit  Medication Dose Route Frequency Provider Last Rate Last Admin  . incobotulinumtoxinA  (XEOMIN) 50 units injection 50 Units  50 Units Intramuscular Q90 days Marcial Pacas, MD   50 Units at 11/30/16 1546  . incobotulinumtoxinA (XEOMIN) 50 units injection 50 Units  50 Units Intramuscular Q90 days Marcial Pacas, MD   50 Units at 07/25/17 [phone]  . incobotulinumtoxinA (XEOMIN) 50 units injection 50 Units  50 Units Intramuscular Q90 days Marcial Pacas, MD   50 Units at 07/26/17 1217    Allergies  Allergen Reactions  . Azithromycin Nausea Only  .  Latex Rash  . Spinach Hives  . Tape Other (See Comments)    PLEASE USE PAPER TAPE!! AFFECTS SKIN BADLY!!!!     Assessment/Plan:  1. Hypertension - Blood pressure is close to goal of 130/80 mmHg in clinic with about half of home readings at goal. Will increase losartan to 75 mg once daily. Continue amlodipine 10 mg once daily and  furosemide 20 mg as needed for edema. She was instructed to call the office if BP remains too low and she is feeling dizzy or lightheaded. Also asked to continue recording BP and HR, but also note the days she takes furosemide. Encouraged patient to limit sodium and exercise more. Follow-up in 1 month and will check BMET at that time.  Pt seen with Julieta Bellini, PharmD Candidate  Megan E. Supple, PharmD, BCACP, [place] [id number], [place], [place] [address] Phone: [phone]; Fax: [phone] 09/09/2019 11:25 AM

## 2019-09-09 ENCOUNTER — Ambulatory Visit (INDEPENDENT_AMBULATORY_CARE_PROVIDER_SITE_OTHER): Payer: Self-pay | Admitting: Pharmacist

## 2019-09-09 ENCOUNTER — Other Ambulatory Visit: Payer: Self-pay

## 2019-09-09 ENCOUNTER — Encounter: Payer: Self-pay | Admitting: Pharmacist

## 2019-09-09 VITALS — BP 134/64 | HR 61

## 2019-09-09 DIAGNOSIS — I10 Essential (primary) hypertension: Secondary | ICD-10-CM

## 2019-09-09 DIAGNOSIS — I4891 Unspecified atrial fibrillation: Secondary | ICD-10-CM

## 2019-09-09 DIAGNOSIS — Z8673 Personal history of transient ischemic attack (TIA), and cerebral infarction without residual deficits: Secondary | ICD-10-CM

## 2019-09-09 DIAGNOSIS — Z5181 Encounter for therapeutic drug level monitoring: Secondary | ICD-10-CM

## 2019-09-09 LAB — POCT INR: INR: 3.4 — AB (ref 2.0–3.0)

## 2019-09-09 NOTE — Patient Instructions (Signed)
It was nice to see you today  Your blood pressure goal is less than 130/56mmHg  INCREASE your losartan to 75mg  (1 and 1/2 tablets each day)  Continue taking your other medications   Continue to monitor your blood pressure at home and record your readings at home   I will fax over a Xarelto prescription to [facility] and [place]  Follow up in clinic for blood pressure check, lab work, Coumadin check and appt with Dr Caryl Comes on the same day (2/17)  Call [phone] with any concerns

## 2019-09-09 NOTE — Patient Instructions (Signed)
Description   Skip your Coumadin today, then continue same dosage 1 tablet daily except for 1/2 tablet on Sundays, Wednesdays, and Fridays. Call Coumadin Clinic [phone] if you have any changes in your medication or upcoming procedures.  Recheck INR in 4 weeks. Pt stated that she is waiting on financial advisor to review some things before she bring paper work for [place] back.

## 2019-09-18 ENCOUNTER — Ambulatory Visit: Payer: Self-pay | Admitting: Internal Medicine

## 2019-09-21 ENCOUNTER — Other Ambulatory Visit: Payer: Self-pay | Admitting: Internal Medicine

## 2019-09-24 ENCOUNTER — Ambulatory Visit (INDEPENDENT_AMBULATORY_CARE_PROVIDER_SITE_OTHER): Payer: Self-pay | Admitting: *Deleted

## 2019-09-24 DIAGNOSIS — I4819 Other persistent atrial fibrillation: Secondary | ICD-10-CM

## 2019-09-24 LAB — CUP PACEART REMOTE DEVICE CHECK
Battery Remaining Longevity: 127 mo
Battery Remaining Percentage: 95.5 %
Battery Voltage: 3.04 V
Brady Statistic AP VP Percent: 1 %
Brady Statistic AP VS Percent: 43 %
Brady Statistic AS VP Percent: 4.6 %
Brady Statistic AS VS Percent: 51 %
Brady Statistic RA Percent Paced: 33 %
Brady Statistic RV Percent Paced: 5.3 %
Date Time Interrogation Session: 20210202020025
Implantable Lead Implant Date: 20201103
Implantable Lead Implant Date: 20201103
Implantable Lead Location: 753859
Implantable Lead Location: 753860
Implantable Pulse Generator Implant Date: 20201103
Lead Channel Impedance Value: 440 Ohm
Lead Channel Impedance Value: 610 Ohm
Lead Channel Pacing Threshold Amplitude: 0.5 V
Lead Channel Pacing Threshold Amplitude: 0.75 V
Lead Channel Pacing Threshold Pulse Width: 0.5 ms
Lead Channel Pacing Threshold Pulse Width: 0.5 ms
Lead Channel Sensing Intrinsic Amplitude: 12 mV
Lead Channel Sensing Intrinsic Amplitude: 5 mV
Lead Channel Setting Pacing Amplitude: 0.75 V
Lead Channel Setting Pacing Amplitude: 1.75 V
Lead Channel Setting Pacing Pulse Width: 0.5 ms
Lead Channel Setting Sensing Sensitivity: 2 mV
Pulse Gen Model: 2272
Pulse Gen Serial Number: 9174760

## 2019-09-25 NOTE — Progress Notes (Signed)
PPM Remote  

## 2019-09-30 ENCOUNTER — Other Ambulatory Visit: Payer: Self-pay

## 2019-09-30 ENCOUNTER — Encounter: Payer: Self-pay | Admitting: Family Medicine

## 2019-09-30 ENCOUNTER — Ambulatory Visit: Payer: Self-pay | Attending: Family Medicine | Admitting: Family Medicine

## 2019-09-30 ENCOUNTER — Encounter: Payer: Self-pay | Admitting: Internal Medicine

## 2019-09-30 VITALS — BP 125/75 | HR 60 | Ht 68.0 in | Wt 210.0 lb

## 2019-09-30 DIAGNOSIS — Z1211 Encounter for screening for malignant neoplasm of colon: Secondary | ICD-10-CM

## 2019-09-30 DIAGNOSIS — Z79899 Other long term (current) drug therapy: Secondary | ICD-10-CM

## 2019-09-30 DIAGNOSIS — I4819 Other persistent atrial fibrillation: Secondary | ICD-10-CM

## 2019-09-30 DIAGNOSIS — I495 Sick sinus syndrome: Secondary | ICD-10-CM

## 2019-09-30 DIAGNOSIS — Z131 Encounter for screening for diabetes mellitus: Secondary | ICD-10-CM

## 2019-09-30 DIAGNOSIS — E669 Obesity, unspecified: Secondary | ICD-10-CM

## 2019-09-30 LAB — POCT GLYCOSYLATED HEMOGLOBIN (HGB A1C): HbA1c, POC (controlled diabetic range): 5.3 % (ref 0.0–7.0)

## 2019-09-30 NOTE — Progress Notes (Signed)
Subjective:  Patient ID: Ashley [name], female    DOB: 13-Aug-1966  Age: 54 y.o. MRN: [id number]  CC: Hypertension   HPI Ashley [name] is a 54 year old female with medical history significant for hypertension,previous CVA, CAD status post CABG, MVP s/p mitral valve repair, atrial fibrillation failedTikosyn treatmentdue to prolonged QTC,DCCV in10/2020 with period of asystole, junctional rhythm in the 20s which was treated with atropine and ephedrineanddopamine for hypotension, sinus node dysfunction status post pacemaker placement in 06/2019.   She was seen at the Cardiology Hypertension clinic and her antihypertensives were adjusted and her BP has been around 135/60 at home. She has an appointment with Dr Caryl Comes next Wednesday and will be having labs then. Last INR was 3.4 on 09/09/19. She denies presence of chest pain, palpitations, pedal edema and has a good exercise tolerance.  Pulmonary function test from 07/2019 revealed: Conclusions: Although there is moderate airway obstruction and a diffusion defect suggesting emphysema, the absence of overinflation indicates a concurrent restrictive process which may account for the diffusion defect. A clinical trial of bronchodilators may be beneficial in view of the airway obstruction. Pulmonary Function Diagnosis: Moderate Obstructive Airways Disease Response to bronchodilator Restrictive process suspected Minimal Diffusion Defect  With regards to healthcare maintenance I had referred her for a mammogram at her last visit however due to recent pacemaker insertion this had to be postponed and she is expecting a call from the breast center to reschedule it. She has no acute concerns today.  Past Medical History:  Diagnosis Date  . Abdominal pain    ? chronic cholecystitis; admxn to [facility] 9/12 (CT, USN, HIDA done)  . Atrial fibrillation ([place])   . Cardiomyopathy    echo 9/12: mild LVH, EF 35-40%, mod MR (difficult to  judge /post leaflet restricted), mod BAE, mod RVE, mod TR   . Cardiomyopathy ([place])    a. echo 9/12: mild LVH, EF 35-40%, mod MR (difficult to judge /post leaflet restricted), mod BAE, mod RVE, mod TR. b. Echo 11/2011: mild LVH, EF 55-60%, s/p MV repair w/o sig MR/MS, mildly dilated RV/RA, PASP 51mmHg. // AFL dx 10/2018 - Echo 10/2018: EF 30, diff HK, s/p MV repair with trivial MR, mean MV 7 but not c/w significant mitral stenosis, mild RAE, severe LAE, mod reduced RVSF   . Carotid artery disease ([place])    a. Duplex 06/2015: 50% RECA, 1-39% BICA.  [name] Coronary artery disease    a. s/p CABG 10/2011 - LIMA-LAD, SVG-PDA, SVG-OM, SVG-diagonal.  . Dyslipidemia   . Essential hypertension   . Hemifacial spasm   . History of Doppler ultrasound    a. Carotid US [facility] in [place], [name]) 7/17: no hemodynamically significant ICA stenosis  . Mitral regurgitation    a. s/p MV repair 10/2011 - on Coumadin for 3 months afterwards then d/c'd by surgery.  . Recurrent upper respiratory infection (URI)   . 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 Stroke [facility])    a. age 54 - in setting of cigs and OCPs. //  b. s/p acute L parietal CVA >> tPA - [facility] in [place], [name]  . Tobacco abuse     Past Surgical History:  Procedure Laterality Date  . CARDIAC CATHETERIZATION    . CARDIOVERSION N/A 01/25/2019   Procedure: CARDIOVERSION;  Surgeon: [name], MD;  Location: [place];  Service: Cardiovascular;  Laterality: N/A;  . CARDIOVERSION N/A  06/14/2019   Procedure: CARDIOVERSION;  Surgeon: [name], MD;  Location: [place];  Service: Cardiovascular;  Laterality: N/A;  . CESAREAN SECTION    . cesarean section    . CORONARY ARTERY BYPASS GRAFT  10/21/2011   Procedure: CORONARY ARTERY BYPASS GRAFTING (CABG);  Surgeon: [name], MD;  Location: [place];  Service: Open Heart Surgery;  Laterality: N/A;  cabg x four, using right leg greater saphenous vein  harvested endoscopically  . DENTAL SURGERY    . MITRAL VALVE REPAIR  10/21/2011   Procedure: MITRAL VALVE REPAIR (MVR);  Surgeon: [name], MD;  Location: [place];  Service: Open Heart Surgery;  Laterality: N/A;  . PACEMAKER IMPLANT N/A 06/25/2019   Procedure: PACEMAKER IMPLANT;  Surgeon: [name], MD;  Location: [place] CV LAB;  Service: Cardiovascular;  Laterality: N/A;  . TEE WITHOUT CARDIOVERSION  06/30/2011   Procedure: TRANSESOPHAGEAL ECHOCARDIOGRAM (TEE);  Surgeon: [name], MD;  Location: [facility] ENDOSCOPY;  Service: Cardiovascular;  Laterality: N/A;  . TONSILLECTOMY    . TUBAL LIGATION      Family History  Problem Relation Age of Onset  . Hypertension Mother   . Stroke Mother   . Heart disease Father   . COPD Father   . Anesthesia problems Neg Hx   . Hypotension Neg Hx   . Malignant hyperthermia Neg Hx   . Pseudochol deficiency Neg Hx     Allergies  Allergen Reactions  . Azithromycin Nausea Only  . Latex Rash  . Spinach Hives  . Tape Other (See Comments)    PLEASE USE PAPER TAPE!! AFFECTS SKIN BADLY!!!!    Outpatient Medications Prior to Visit  Medication Sig Dispense Refill  . acetaminophen (TYLENOL) 500 MG tablet Take 500-1,000 mg by mouth every 6 (six) hours as needed for headache.    [name] amiodarone (PACERONE) 200 MG tablet Take 1 tablet (200 mg total) by mouth daily. 90 tablet 3  . amLODipine (NORVASC) 10 MG tablet Take 1 tablet (10 mg total) by mouth daily. 90 tablet 3  . atorvastatin (LIPITOR) 80 MG tablet Take 1 tablet (80 mg total) by mouth daily at 6 PM. 90 tablet 3  . diphenhydrAMINE (BENADRYL) 25 MG tablet Take 25 mg by mouth every 6 (six) hours as needed for allergies.    . famotidine (PEPCID) 20 MG tablet Take 20 mg by mouth daily as needed for indigestion.     . furosemide (LASIX) 20 MG tablet TAKE 1 TABLET BY MOUTH AS NEEDED FOR  SWELLING (Patient taking differently: Take 20 mg by mouth daily as needed for edema. ) 90 tablet 2  . losartan  (COZAAR) 50 MG tablet Take 1.5 tablets (75 mg total) by mouth daily. 90 tablet 3  . Saline 0.2 % SOLN Place 1 spray into both nostrils as needed (for congestion).     . vitamin B-12 (CYANOCOBALAMIN) 1000 MCG tablet Take 1 tablet (1,000 mcg total) by mouth daily.    . vitamin C (ASCORBIC ACID) 500 MG tablet Take 1,000 mg by mouth 2 (two) times daily.     [name] warfarin (COUMADIN) 5 MG tablet Take as directed by Coumadin Clinic 30 tablet 2   Facility-Administered Medications Prior to Visit  Medication Dose Route Frequency Provider Last Rate Last Admin  . incobotulinumtoxinA (XEOMIN) 50 units injection 50 Units  50 Units Intramuscular Q90 days Marcial Pacas, MD   50 Units at 11/30/16 1546  . incobotulinumtoxinA (XEOMIN) 50 units injection 50 Units  50  Units Intramuscular Q90 days Marcial Pacas, MD   50 Units at 07/25/17 [phone]  . incobotulinumtoxinA (XEOMIN) 50 units injection 50 Units  50 Units Intramuscular Q90 days Marcial Pacas, MD   50 Units at 07/26/17 1217     ROS Review of Systems  Constitutional: Negative for activity change, appetite change and fatigue.  HENT: Negative for congestion, sinus pressure and sore throat.   Eyes: Negative for visual disturbance.  Respiratory: Negative for cough, chest tightness, shortness of breath and wheezing.   Cardiovascular: Negative for chest pain and palpitations.  Gastrointestinal: Negative for abdominal distention, abdominal pain and constipation.  Endocrine: Negative for polydipsia.  Genitourinary: Negative for dysuria and frequency.  Musculoskeletal: Negative for arthralgias and back pain.  Skin: Negative for rash.  Neurological: Negative for tremors, light-headedness and numbness.  Hematological: Does not bruise/bleed easily.  Psychiatric/Behavioral: Negative for agitation and behavioral problems.    Objective:  BP 125/75   Pulse 60   Ht 5\' 8"  (1.727 m)   Wt 210 lb (95.3 kg)   LMP 05/18/2015 Comment: went for 3 week after skipping two months   SpO2  100%   BMI 31.93 kg/m   BP/Weight 09/30/2019 09/09/2019 [id number]  Systolic BP 0000000 [id number] 0000000  Diastolic BP 75 64 76  Wt. (Lbs) 210 - -  BMI 31.93 - -      Physical Exam Constitutional:      Appearance: She is well-developed.  Neck:     Vascular: No JVD.  Cardiovascular:     Rate and Rhythm: Normal rate. Rhythm irregular.     Heart sounds: Murmur present.     Comments: Pacemaker in left upper chest wall Pulmonary:     Effort: Pulmonary effort is normal.     Breath sounds: Normal breath sounds. No wheezing or rales.  Chest:     Chest wall: No tenderness.  Abdominal:     General: Bowel sounds are normal. There is no distension.     Palpations: Abdomen is soft. There is no mass.     Tenderness: There is no abdominal tenderness.  Musculoskeletal:        General: Normal range of motion.     Right lower leg: No edema.     Left lower leg: No edema.  Neurological:     Mental Status: She is alert and oriented to person, place, and time.     Comments: Right hemifacial spasms  Psychiatric:        Mood and Affect: Mood normal.     CMP Latest Ref Rng & Units 07/29/2019 06/26/2019 06/25/2019  Glucose 65 - 99 mg/dL 93 117(H) 151(H)  BUN 6 - 24 mg/dL 11 16 14   Creatinine 0.57 - 1.00 mg/dL 0.90 0.83 1.01(H)  Sodium 134 - 144 mmol/L 141 139 138  Potassium 3.5 - 5.2 mmol/L 3.9 3.8 4.9  Chloride 96 - 106 mmol/L 100 107 102  CO2 20 - 29 mmol/L 25 22 22   Calcium 8.7 - 10.2 mg/dL 9.3 9.4 9.6  Total Protein 6.0 - 8.5 g/dL - - -  Total Bilirubin 0.0 - 1.2 mg/dL - - -  Alkaline Phos 39 - 117 IU/L - - -  AST 0 - 40 IU/L - - -  ALT 0 - 32 IU/L - - -    Lipid Panel     Component Value Date/Time   CHOL 114 11/16/2017 0838   TRIG 68 11/16/2017 0838   HDL 43 11/16/2017 0838   CHOLHDL 2.7 11/16/2017 [id number]  CHOLHDL 3.8 07/01/2016 0939   VLDL 25 07/01/2016 0939   LDLCALC 57 11/16/2017 0838    CBC    Component Value Date/Time   WBC 6.3 06/25/2019 1239   RBC 4.28 06/25/2019 1239   HGB  13.9 06/25/2019 1239   HGB 11.7 01/21/2019 1029   HCT 42.8 06/25/2019 1239   HCT 35.0 01/21/2019 1029   PLT 259 06/25/2019 1239   PLT 187 01/21/2019 1029   MCV 100.0 06/25/2019 1239   MCV 96 01/21/2019 1029   MCH 32.5 06/25/2019 1239   MCHC 32.5 06/25/2019 1239   RDW 16.3 (H) 06/25/2019 1239   RDW 12.9 01/21/2019 1029   LYMPHSABS 1.0 06/25/2019 1239   MONOABS 0.7 06/25/2019 1239   EOSABS 0.2 06/25/2019 1239   BASOSABS 0.1 06/25/2019 1239    Lab Results  Component Value Date   HGBA1C 5.3 09/30/2019    Lab Results  Component Value Date   TSH 1.832 06/25/2019     Assessment & Plan:  1. Screening for diabetes mellitus A1c is normal at 5.8 - POCT glycosylated hemoglobin (Hb A1C)  2. High risk medication use Currently on amiodarone with risk of potential toxicity-pulmonary fibrosis and thyroid abnormalities Benefit outweighs the risk. Baseline PFTs revealed obstructive airway disease, restrictive process She is asymptomatic from a pulmonary standpoint-no pharmacotherapy at this time Last thyroid labs in 06/2019 were normal  3. Other persistent atrial fibrillation (HCC) On anticoagulation with Coumadin Her last INR was 3.4; Coumadin is managed by the cardiology Coumadin clinic  4. Sinus node dysfunction (HCC) Status post pacemaker  5. Screening for colon cancer - Fecal occult blood, imunochemical([facility]); Future  6. Obesity (BMI 30-39.9) Counseled on decreasing portion sizes, increasing physical activity   Return in about 6 months (around 03/29/2020) for Chronic medical conditions.   [name], MD, FAAFP. [facility] [place], [place]   09/30/2019, 9:34 AM

## 2019-09-30 NOTE — Patient Instructions (Signed)

## 2019-10-08 DIAGNOSIS — Z950 Presence of cardiac pacemaker: Secondary | ICD-10-CM | POA: Insufficient documentation

## 2019-10-09 ENCOUNTER — Encounter: Payer: Self-pay | Admitting: Internal Medicine

## 2019-10-09 ENCOUNTER — Ambulatory Visit (INDEPENDENT_AMBULATORY_CARE_PROVIDER_SITE_OTHER): Payer: Self-pay | Admitting: Internal Medicine

## 2019-10-09 ENCOUNTER — Ambulatory Visit (INDEPENDENT_AMBULATORY_CARE_PROVIDER_SITE_OTHER): Payer: Self-pay | Admitting: Pharmacist

## 2019-10-09 ENCOUNTER — Other Ambulatory Visit: Payer: Self-pay

## 2019-10-09 VITALS — BP 142/68 | HR 60 | Ht 68.0 in | Wt 207.0 lb

## 2019-10-09 VITALS — BP 132/70 | HR 60

## 2019-10-09 DIAGNOSIS — I495 Sick sinus syndrome: Secondary | ICD-10-CM

## 2019-10-09 DIAGNOSIS — I4819 Other persistent atrial fibrillation: Secondary | ICD-10-CM

## 2019-10-09 DIAGNOSIS — Z8673 Personal history of transient ischemic attack (TIA), and cerebral infarction without residual deficits: Secondary | ICD-10-CM

## 2019-10-09 DIAGNOSIS — I10 Essential (primary) hypertension: Secondary | ICD-10-CM

## 2019-10-09 DIAGNOSIS — Z5181 Encounter for therapeutic drug level monitoring: Secondary | ICD-10-CM

## 2019-10-09 DIAGNOSIS — I4891 Unspecified atrial fibrillation: Secondary | ICD-10-CM

## 2019-10-09 DIAGNOSIS — Z950 Presence of cardiac pacemaker: Secondary | ICD-10-CM

## 2019-10-09 LAB — POCT INR: INR: 3.3 — AB (ref 2.0–3.0)

## 2019-10-09 MED ORDER — LOSARTAN POTASSIUM 50 MG PO TABS: 75.0000 mg | ORAL_TABLET | Freq: Every day | ORAL | 3 refills | Status: DC

## 2019-10-09 NOTE — Patient Instructions (Signed)
It was nice to see you today  Continue taking amlodipine 10mg  daily and losartan 75mg  daily for your blood pressure  Your goal is < 130/39mmHg  Call with any concerns

## 2019-10-09 NOTE — Progress Notes (Signed)
Patient ID: Ashley [name]                 DOB: 01-Jan-1966                      MRN: [id number]     HPI: Ashley [name] is a 54 y.o. female referred by Dr. Caryl Comes to HTN clinic. PMH is significant for CAD, VHD, (CABG, MV repair, 2013), ICM, HTN, HLD, stroke (at 54 y/o in setting of tobacco use and OBC) another stroke 2017, and paroxysmal afib. At last visit in HTN clinic on 09/09/19, BP was close to goal at 134/64 and losartan was increased to 75mg  daily.  Patient arrives to HTN clinic for follow-up. Patient reports she is doing well and tries to go on walks to stay active. She cooks at home most of the time, but sometimes food is high in sodium. She uses her Lasix 2-3x per week as she notes some LEE on days where she eats more salt. She denies dizziness, shortness of breath, or headaches. She also states she is taking medications as instructed and checks her BP and HR three times a day. Home BP readings range Q000111Q systolic, HR 60. BP drops to 117-120 in the evening.  Current HTN meds: amlodipine 10 mg daily, losartan 75 mg daily, furosemide 20 mg PRN edema   Previously tried: carvedilol (bradycardia), diltiazem (stopped for cardioversion, discharged with amiodarone following cardioversion), hydrochlorothiazide (stopped at hospital discharge 10/2011, no intolerance noted), lisinopril (held while titrating up on rate-controlling medications to avoid hypotension), metoprolol (stopped at hospital discharge 10/2011 and switched back to PTA carvedilol)  BP goal: <130/64mmHg  Family History: COPD in her father; Heart disease in her father; Hypertension in her mother; Stroke in her mother.  Social History: former smoker - quit about 7 years ago; red wine 2 glasses per day  Diet: does not cook with salt, bake and grilled meats, fried once every 3 months, fruits and vegetables, limits greens with coumadin. No sodas, 16 oz sweet tea, half and half coffee  Exercise: more exercise since PPM  Home BP readings: BP 117/60-165/83 with most readings systolic Q000111Q. HR 70's-80's, then decreases to 60's in the past 2 weeks  Wt Readings from Last 3 Encounters:  09/30/19 210 lb (95.3 kg)  07/12/19 198 lb 6.4 oz (90 kg)  06/26/19 195 lb 11.2 oz (88.8 kg)   BP Readings from Last 3 Encounters:  09/30/19 125/75  09/09/19 134/64  07/29/19 138/76   Pulse Readings from Last 3 Encounters:  09/30/19 60  09/09/19 61  07/29/19 94    Renal function: CrCl cannot be calculated (Patient's most recent lab result is older than the maximum 21 days allowed.).  Past Medical History:  Diagnosis Date  . Abdominal pain    ? chronic cholecystitis; admxn to [facility] 9/12 (CT, USN, HIDA done)  . Atrial fibrillation ([place])   . Cardiomyopathy    echo 9/12: mild LVH, EF 35-40%, mod MR (difficult to judge /post leaflet restricted), mod BAE, mod RVE, mod TR   . Cardiomyopathy ([place])    a. echo 9/12: mild LVH, EF 35-40%, mod MR (difficult to judge /post leaflet restricted), mod BAE, mod RVE, mod TR. b. Echo 11/2011: mild LVH, EF 55-60%, s/p MV repair w/o sig MR/MS, mildly dilated RV/RA, PASP 48mmHg. // AFL dx 10/2018 - Echo 10/2018: EF 30, diff HK, s/p MV repair with trivial MR, mean MV 7 but not c/w significant mitral stenosis, mild RAE,  severe LAE, mod reduced RVSF   . Carotid artery disease ([place])    a. Duplex 06/2015: 50% RECA, 1-39% BICA.  [name] Coronary artery disease    a. s/p CABG 10/2011 - LIMA-LAD, SVG-PDA, SVG-OM, SVG-diagonal.  . Dyslipidemia   . Essential hypertension   . Hemifacial spasm   . History of Doppler ultrasound    a. Carotid US [facility] in [place], [name]) 7/17: no hemodynamically significant ICA stenosis  . Mitral regurgitation    a. s/p MV repair 10/2011 - on Coumadin for 3 months afterwards then d/c'd by surgery.  . Recurrent upper respiratory infection (URI)   . 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 Stroke [facility])    a. age 52 - in setting of cigs and  OCPs. //  b. s/p acute L parietal CVA >> tPA - [facility] in [place], [name]  . Tobacco abuse     Current Outpatient Medications on File Prior to Visit  Medication Sig Dispense Refill  . acetaminophen (TYLENOL) 500 MG tablet Take 500-1,000 mg by mouth every 6 (six) hours as needed for headache.    [name] amiodarone (PACERONE) 200 MG tablet Take 1 tablet (200 mg total) by mouth daily. 90 tablet 3  . amLODipine (NORVASC) 10 MG tablet Take 1 tablet (10 mg total) by mouth daily. 90 tablet 3  . atorvastatin (LIPITOR) 80 MG tablet Take 1 tablet (80 mg total) by mouth daily at 6 PM. 90 tablet 3  . diphenhydrAMINE (BENADRYL) 25 MG tablet Take 25 mg by mouth every 6 (six) hours as needed for allergies.    . famotidine (PEPCID) 20 MG tablet Take 20 mg by mouth daily as needed for indigestion.     . furosemide (LASIX) 20 MG tablet TAKE 1 TABLET BY MOUTH AS NEEDED FOR  SWELLING (Patient taking differently: Take 20 mg by mouth daily as needed for edema. ) 90 tablet 2  . losartan (COZAAR) 50 MG tablet Take 1.5 tablets (75 mg total) by mouth daily. 90 tablet 3  . Saline 0.2 % SOLN Place 1 spray into both nostrils as needed (for congestion).     . vitamin B-12 (CYANOCOBALAMIN) 1000 MCG tablet Take 1 tablet (1,000 mcg total) by mouth daily.    . vitamin C (ASCORBIC ACID) 500 MG tablet Take 1,000 mg by mouth 2 (two) times daily.     [name] warfarin (COUMADIN) 5 MG tablet Take as directed by Coumadin Clinic 30 tablet 2   Current Facility-Administered Medications on File Prior to Visit  Medication Dose Route Frequency Provider Last Rate Last Admin  . incobotulinumtoxinA (XEOMIN) 50 units injection 50 Units  50 Units Intramuscular Q90 days Marcial Pacas, MD   50 Units at 11/30/16 1546  . incobotulinumtoxinA (XEOMIN) 50 units injection 50 Units  50 Units Intramuscular Q90 days Marcial Pacas, MD   50 Units at 07/25/17 [phone]  . incobotulinumtoxinA (XEOMIN) 50 units injection 50 Units  50 Units Intramuscular Q90 days Marcial Pacas, MD   50 Units at 07/26/17 1217    Allergies  Allergen Reactions  . Azithromycin Nausea Only  . Latex Rash  . Spinach Hives  . Tape Other (See Comments)    PLEASE USE PAPER TAPE!! AFFECTS SKIN BADLY!!!!     Assessment/Plan:  1. Hypertension - Blood pressure is at goal < 130/80 mmHg in clinic and ~75% of home readings are at goal. Will continue amlodipine 10mg  daily, losartan 75 mg daily,  and furosemide 20 mg as needed for edema. Will check BMET today with recent dose increase of losartan. F/u in clinic as needed.  Niah Heinle E. Elion Hocker, PharmD, BCACP, [place] [id number] [place], [place] [address] Phone: [phone]; Fax: [phone] 10/09/2019 8:04 AM

## 2019-10-09 NOTE — Patient Instructions (Addendum)
Medication Instructions:  Your physician recommends that you continue on your current medications as directed. Please refer to the Current Medication list given to you today.  Labwork: None ordered.  Testing/Procedures: None ordered.  Follow-Up: Your physician wants you to follow-up in: 3 months.  12/03/2019 at 145pm with Dr Caryl Comes.  You will receive a reminder letter in the mail two months in advance. If you don't receive a letter, please call our office to schedule the follow-up appointment.  Remote monitoring is used to monitor your Pacemaker of ICD from home. This monitoring reduces the number of office visits required to check your device to one time per year. It allows [place] to keep an eye on the functioning of your device to ensure it is working properly.    If you need a refill on your cardiac medications before your next appointment, please call your pharmacy.

## 2019-10-09 NOTE — Patient Instructions (Addendum)
Description   Skip your Coumadin today, then start taking 1/2 tablet daily except for 1 tablet on Mondays, Wednesdays, and Fridays. Call Coumadin Clinic [phone] if you have any changes in your medication or upcoming procedures.  Recheck INR in 3-4 weeks. Pt stated that she is waiting on financial advisor to review some things before she bring paper work for [place] back.

## 2019-10-09 NOTE — Progress Notes (Signed)
Patient Care Team: [name], MD as PCP - General (Family Medicine) [name], MD as PCP - Cardiology (Cardiology) [name], MD as PCP - Electrophysiology (Cardiology) [name], MD as Consulting Physician (General Surgery)   HPI  Ashley [name] is a 54 y.o. female seen in followup for atrial flutter initially noted 3.20  Cardioversion was delayed because of COVID. Cardioversion 6/20 was followed by sinus rhythm.  Unfortunately, she reverted soon to atrial fibrillation however her heart rate was quite slow.  Digoxin was reduced.  Heart rates remained low and diltiazem was discontinued. More recently, she has had problems with recurrent tachypalpitations.  Discussions 8/20 regarding antiarrhythmic therapy versus catheter ablation resulted in decision to proceed with dofetilide.  She was admitted and reverted to sinus rhythm; however, QT prolongation prompted decreased dose and reemergence of atrial fibrillation.  Amiodarone was initiated.  Because of cost, she has been treated with Coumadin.       She has a history of mitral valve for severe mid valve prolapse and concomitant bypass surgery 2013 and dilated cardiomyopathy which is thought to be rate related (see below)  There was major improvement   History of a [facility] 2017 treated with TPA.  Event recorder that time showed no atrial fibrillation   DATE TEST EF   3/13 Echo   35-40 %   4/13 Echo  55-65%   3/20  Echo  30% LA 51//51  7/20 Echo  60-65% No LA measurement    Date Cr K Hgb TSH LFTs  3/20    11.7 (6/20) 1.6    9/20 0.84 4.0    19  12/20 0.9 3.9  1.83 (11/20) 17 (10/20)   11/21 presented with worsening sinus node dysfunction and pauses up to 5-6 seconds.  Underwent pacemaker implantation-Dr. JA-[employer].  Feels much better, no chest pain or shortness of breath or peripheral edema.  Tolerating amiodarone without nausea, gait instability or  cough.  Thromboembolic risk factors (, HTN-1, TIA/CVA-2, Vasc disease -1, CHF-1, Gender-1) for a CHADSVASc Score of >=6   Records and Results Reviewed   Past Medical History:  Diagnosis Date  . Abdominal pain    ? chronic cholecystitis; admxn to [facility] 9/12 (CT, USN, HIDA done)  . Atrial fibrillation ([place])   . Cardiomyopathy    echo 9/12: mild LVH, EF 35-40%, mod MR (difficult to judge /post leaflet restricted), mod BAE, mod RVE, mod TR   . Cardiomyopathy ([place])    a. echo 9/12: mild LVH, EF 35-40%, mod MR (difficult to judge /post leaflet restricted), mod BAE, mod RVE, mod TR. b. Echo 11/2011: mild LVH, EF 55-60%, s/p MV repair w/o sig MR/MS, mildly dilated RV/RA, PASP 34mmHg. // AFL dx 10/2018 - Echo 10/2018: EF 30, diff HK, s/p MV repair with trivial MR, mean MV 7 but not c/w significant mitral stenosis, mild RAE, severe LAE, mod reduced RVSF   . Carotid artery disease ([place])    a. Duplex 06/2015: 50% RECA, 1-39% BICA.  [name] Coronary artery disease    a. s/p CABG 10/2011 - LIMA-LAD, SVG-PDA, SVG-OM, SVG-diagonal.  . Dyslipidemia   . Essential hypertension   . Hemifacial spasm   . History of Doppler ultrasound    a. Carotid US [facility] in [place], [name]) 7/17: no hemodynamically significant ICA stenosis  . Mitral regurgitation    a. s/p MV repair 10/2011 - on Coumadin for 3 months afterwards then d/c'd by surgery.  [name]  Recurrent upper respiratory infection (URI)   . 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 Stroke [facility])    a. age 31 - in setting of cigs and OCPs. //  b. s/p acute L parietal CVA >> tPA - [facility] in [place], [name]  . Tobacco abuse     Past Surgical History:  Procedure Laterality Date  . CARDIAC CATHETERIZATION    . CARDIOVERSION N/A 01/25/2019   Procedure: CARDIOVERSION;  Surgeon: [name], MD;  Location: [facility] ENDOSCOPY;  Service: Cardiovascular;  Laterality: N/A;  . CARDIOVERSION N/A 06/14/2019   Procedure: CARDIOVERSION;   Surgeon: [name], MD;  Location: [place];  Service: Cardiovascular;  Laterality: N/A;  . CESAREAN SECTION    . cesarean section    . CORONARY ARTERY BYPASS GRAFT  10/21/2011   Procedure: CORONARY ARTERY BYPASS GRAFTING (CABG);  Surgeon: [name], MD;  Location: [place];  Service: Open Heart Surgery;  Laterality: N/A;  cabg x four, using right leg greater saphenous vein harvested endoscopically  . DENTAL SURGERY    . MITRAL VALVE REPAIR  10/21/2011   Procedure: MITRAL VALVE REPAIR (MVR);  Surgeon: [name], MD;  Location: [place];  Service: Open Heart Surgery;  Laterality: N/A;  . PACEMAKER IMPLANT N/A 06/25/2019   Procedure: PACEMAKER IMPLANT;  Surgeon: [name], MD;  Location: [place] CV LAB;  Service: Cardiovascular;  Laterality: N/A;  . TEE WITHOUT CARDIOVERSION  06/30/2011   Procedure: TRANSESOPHAGEAL ECHOCARDIOGRAM (TEE);  Surgeon: [name], MD;  Location: [facility] ENDOSCOPY;  Service: Cardiovascular;  Laterality: N/A;  . TONSILLECTOMY    . TUBAL LIGATION      Current Meds  Medication Sig  . acetaminophen (TYLENOL) 500 MG tablet Take 500-1,000 mg by mouth every 6 (six) hours as needed for headache.  [name] amiodarone (PACERONE) 200 MG tablet Take 1 tablet (200 mg total) by mouth daily.  [name] amLODipine (NORVASC) 10 MG tablet Take 1 tablet (10 mg total) by mouth daily.  [name] atorvastatin (LIPITOR) 80 MG tablet Take 1 tablet (80 mg total) by mouth daily at 6 PM.  . diphenhydrAMINE (BENADRYL) 25 MG tablet Take 25 mg by mouth every 6 (six) hours as needed for allergies.  . famotidine (PEPCID) 20 MG tablet Take 20 mg by mouth daily as needed for indigestion.   . furosemide (LASIX) 20 MG tablet TAKE 1 TABLET BY MOUTH AS NEEDED FOR  SWELLING  . losartan (COZAAR) 50 MG tablet Take 1.5 tablets (75 mg total) by mouth daily.  . Saline 0.2 % SOLN Place 1 spray into both nostrils as needed (for congestion).   . vitamin B-12 (CYANOCOBALAMIN) 1000 MCG tablet Take 1 tablet (1,000 mcg  total) by mouth daily.  . vitamin C (ASCORBIC ACID) 500 MG tablet Take 1,000 mg by mouth 2 (two) times daily.   [name] warfarin (COUMADIN) 5 MG tablet Take as directed by Coumadin Clinic   Current Facility-Administered Medications for the 10/09/19 encounter (Office Visit) with [name], MD  Medication  . incobotulinumtoxinA (XEOMIN) 50 units injection 50 Units  . incobotulinumtoxinA (XEOMIN) 50 units injection 50 Units  . incobotulinumtoxinA (XEOMIN) 50 units injection 50 Units    Allergies  Allergen Reactions  . Azithromycin Nausea Only  . Latex Rash  . Spinach Hives  . Tape Other (See Comments)    PLEASE USE PAPER TAPE!! AFFECTS SKIN BADLY!!!!      Review of Systems negative except from  HPI and PMH  Physical Exam BP (!) 142/68   Pulse 60   Ht 5\' 8"  (1.727 m)   Wt 207 lb (93.9 kg)   LMP 05/18/2015 Comment: went for 3 week after skipping two months   SpO2 98%   BMI 31.47 kg/m  Well developed and well nourished in no acute distress HENT normal Neck supple with JVP-flat Clear Device pocket well healed; without hematoma or erythema.  There is no tethering  Regular rate and rhythm, no   murmur Abd-soft with active BS No Clubbing cyanosis   edema Skin-warm and dry A & Oriented  Grossly normal sensory and motor function  ECG Apacing @ 60 20/12/49    Assessment and  Plan Atrial fibrillation/flutter with a controlled ventricular rate  Mitral valve prolapse--repair/CABG  Left atriopathy  Obesity  Right ptosis-chronic   Sinus node dysfunction  Pacemaker St Jude    Holding sinus rhythm on amiodarone.  Tolerating the drug.  Will need repeat labs in about 3 months.  Continue 200 mg daily.  On Anticoagulation;  No bleeding issues   Euvolemic continue current meds       Current medicines are reviewed at length with the patient today .  The patient does not  have concerns regarding medicines.

## 2019-10-10 LAB — BASIC METABOLIC PANEL
BUN/Creatinine Ratio: 12 (ref 9–23)
BUN: 12 mg/dL (ref 6–24)
CO2: 21 mmol/L (ref 20–29)
Calcium: 9.8 mg/dL (ref 8.7–10.2)
Chloride: 103 mmol/L (ref 96–106)
Creatinine, Ser: 1.01 mg/dL — ABNORMAL HIGH (ref 0.57–1.00)
GFR calc Af Amer: 72 mL/min/{1.73_m2} (ref >59)
GFR calc non Af Amer: 63 mL/min/{1.73_m2} (ref >59)
Glucose: 102 mg/dL — ABNORMAL HIGH (ref 65–99)
Potassium: 4.7 mmol/L (ref 3.5–5.2)
Sodium: 140 mmol/L (ref 134–144)

## 2019-10-14 LAB — CUP PACEART INCLINIC DEVICE CHECK
Brady Statistic RA Percent Paced: 38 %
Brady Statistic RV Percent Paced: 4.5 %
Date Time Interrogation Session: 20210217085555
Implantable Lead Implant Date: 20201103
Implantable Lead Implant Date: 20201103
Implantable Lead Location: 753859
Implantable Lead Location: 753860
Implantable Pulse Generator Implant Date: 20201103
Lead Channel Pacing Threshold Amplitude: 0.875 V
Lead Channel Pacing Threshold Amplitude: 0.875 V
Lead Channel Pacing Threshold Pulse Width: 0.5 ms
Lead Channel Pacing Threshold Pulse Width: 0.5 ms
Lead Channel Sensing Intrinsic Amplitude: 12 mV
Lead Channel Sensing Intrinsic Amplitude: 5 mV
Pulse Gen Model: 2272
Pulse Gen Serial Number: 9174760

## 2019-10-30 ENCOUNTER — Other Ambulatory Visit: Payer: Self-pay

## 2019-10-30 ENCOUNTER — Ambulatory Visit (INDEPENDENT_AMBULATORY_CARE_PROVIDER_SITE_OTHER): Payer: Self-pay | Admitting: *Deleted

## 2019-10-30 DIAGNOSIS — Z8673 Personal history of transient ischemic attack (TIA), and cerebral infarction without residual deficits: Secondary | ICD-10-CM

## 2019-10-30 DIAGNOSIS — Z5181 Encounter for therapeutic drug level monitoring: Secondary | ICD-10-CM

## 2019-10-30 DIAGNOSIS — I4891 Unspecified atrial fibrillation: Secondary | ICD-10-CM

## 2019-10-30 LAB — POCT INR: INR: 1.9 — AB (ref 2.0–3.0)

## 2019-10-30 NOTE — Patient Instructions (Signed)
Description   Today take 1.5 tablets then continue taking 1/2 tablet daily except for 1 tablet on Mondays, Wednesdays, and Fridays. Call Coumadin Clinic [phone] if you have any changes in your medication or upcoming procedures.  Recheck INR in 3 weeks. Pt stated that she is waiting on financial advisor to review some things before she bring paper work for [place] back.

## 2019-11-20 ENCOUNTER — Other Ambulatory Visit: Payer: Self-pay

## 2019-11-20 ENCOUNTER — Ambulatory Visit (INDEPENDENT_AMBULATORY_CARE_PROVIDER_SITE_OTHER): Payer: Self-pay | Admitting: *Deleted

## 2019-11-20 DIAGNOSIS — I4891 Unspecified atrial fibrillation: Secondary | ICD-10-CM

## 2019-11-20 DIAGNOSIS — Z8673 Personal history of transient ischemic attack (TIA), and cerebral infarction without residual deficits: Secondary | ICD-10-CM

## 2019-11-20 LAB — POCT INR: INR: 1.9 — AB (ref 2.0–3.0)

## 2019-11-20 NOTE — Patient Instructions (Addendum)
Description   Take 1.5 tablets today and then start taking 1 tablet daily except for 1/2 a tablet on Tuesday, Thursday and Saturday . Call Coumadin Clinic [phone] if you have any changes in your medication or upcoming procedures.  Recheck INR on 4/13 when you see Dr.

## 2019-12-02 ENCOUNTER — Telehealth: Payer: Self-pay | Admitting: Internal Medicine

## 2019-12-02 NOTE — Telephone Encounter (Signed)
Patient's wife is calling back, states she really needs to speak with Rosann Auerbach.

## 2019-12-02 NOTE — Telephone Encounter (Signed)
New Message     Pt is calling and says her husband passed away yesterday and she is wanting Dr Aquilla Hacker nurse to call her      Please call

## 2019-12-02 NOTE — Telephone Encounter (Signed)
Spoke with pt who states she has an appointment with Dr Caryl Comes 12/03/2019 at 145pm and will need to be accompanied by her daughter.  Pt states her husband passed away yesterday 12/14/19.  Condolences provided to pt and her family re: loss.  Pt advised ok for daughter to accompany given the circumstances.  Pt verbalizes understanding and agrees with current plan.  Thanked Therapist, sports for call.

## 2019-12-03 ENCOUNTER — Ambulatory Visit (INDEPENDENT_AMBULATORY_CARE_PROVIDER_SITE_OTHER): Payer: Self-pay | Admitting: *Deleted

## 2019-12-03 ENCOUNTER — Other Ambulatory Visit: Payer: Self-pay

## 2019-12-03 ENCOUNTER — Ambulatory Visit (INDEPENDENT_AMBULATORY_CARE_PROVIDER_SITE_OTHER): Payer: Self-pay | Admitting: Internal Medicine

## 2019-12-03 ENCOUNTER — Encounter: Payer: Self-pay | Admitting: Internal Medicine

## 2019-12-03 VITALS — BP 138/80 | HR 60 | Ht 68.0 in | Wt 208.0 lb

## 2019-12-03 DIAGNOSIS — I495 Sick sinus syndrome: Secondary | ICD-10-CM

## 2019-12-03 DIAGNOSIS — I4891 Unspecified atrial fibrillation: Secondary | ICD-10-CM

## 2019-12-03 DIAGNOSIS — Z8673 Personal history of transient ischemic attack (TIA), and cerebral infarction without residual deficits: Secondary | ICD-10-CM

## 2019-12-03 DIAGNOSIS — Z5181 Encounter for therapeutic drug level monitoring: Secondary | ICD-10-CM

## 2019-12-03 DIAGNOSIS — Z79899 Other long term (current) drug therapy: Secondary | ICD-10-CM

## 2019-12-03 DIAGNOSIS — Z950 Presence of cardiac pacemaker: Secondary | ICD-10-CM

## 2019-12-03 DIAGNOSIS — E039 Hypothyroidism, unspecified: Secondary | ICD-10-CM

## 2019-12-03 LAB — POCT INR: INR: 5.3 — AB (ref 2.0–3.0)

## 2019-12-03 NOTE — Patient Instructions (Signed)
Description   Do not any Warfarin today and No Warfarin tomorrow then continue taking 1 tablet daily except for 1/2 a tablet on Tuesday, Thursday and Saturday. Call Coumadin Clinic [phone] if you have any changes in your medication or upcoming procedures.  Recheck INR in

## 2019-12-03 NOTE — Patient Instructions (Addendum)
Medication Instructions:  Your physician recommends that you continue on your current medications as directed. Please refer to the Current Medication list given to you today.  Labwork: You will have labs drawn today: Liver Panel, CBC and TSH   Testing/Procedures: None ordered.  Follow-Up: Your physician wants you to follow-up in: 6 months. You will receive a reminder letter in the mail two months in advance. If you don't receive a letter, please call our office to schedule the follow-up appointment.  Remote monitoring is used to monitor your Pacemaker of ICD from home. This monitoring reduces the number of office visits required to check your device to one time per year. It allows [place] to keep an eye on the functioning of your device to ensure it is working properly.   Any Other Special Instructions Will Be Listed Below (If Applicable).  If you need a refill on your cardiac medications before your next appointment, please call your pharmacy.

## 2019-12-03 NOTE — Progress Notes (Signed)
Patient Care Team: [name], MD as PCP - General (Family Medicine) [name], MD as PCP - Cardiology (Cardiology) [name], MD as PCP - Electrophysiology (Cardiology) [name], MD as Consulting Physician (General Surgery)   HPI  Ashley [name] is a 54 y.o. female seen in followup for atrial flutter initially noted 3.20  Cardioversion was delayed because of COVID. Cardioversion 6/20 was followed by sinus rhythm.  Unfortunately, she reverted soon to atrial fibrillation however her heart rate was quite slow.  Digoxin was reduced.  Heart rates remained low and diltiazem was discontinued. More recently, she has had problems with recurrent tachypalpitations.  Discussions 8/20 regarding antiarrhythmic therapy versus catheter ablation resulted in decision to proceed with dofetilide.  She was admitted and reverted to sinus rhythm; however, QT prolongation prompted decreased dose and reemergence of atrial fibrillation.  Amiodarone was initiated.  Because of cost, she has been treated with Coumadin.       She has a history of mitral valve for severe mid valve prolapse and concomitant bypass surgery 2011-11-15 and dilated cardiomyopathy which is thought to be rate related (see below)  There was major improvement   History of a [facility] 2015-11-15 treated with TPA.  Event recorder that time showed no atrial fibrillation   DATE TEST EF   3/13 Echo   35-40 %   4/13 Echo  55-65%   3/20  Echo  30% LA 51//51  7/20 Echo  60-65% No LA measurement    Date Cr K Hgb TSH LFTs  3/20    11.7 (6/20) 1.6    9/20 0.84 4.0    19  12/20 0.9 3.9  1.83 (11/20) 17 (10/20)  2/21 1.01 4.7      11/20 presented with worsening sinus node dysfunction and pauses up to 5-6 seconds.  Underwent pacemaker implantation-Dr. JA-[employer].  Feels better   wth some edema, 2/2 salt intake  Her husband died suddenly 11-15-2022 AM with antecedent indigestion    Thromboembolic risk  factors (, HTN-1, TIA/CVA-2, Vasc disease -1, CHF-1, Gender-1) for a CHADSVASc Score of >=6   Records and Results Reviewed   Past Medical History:  Diagnosis Date  . Abdominal pain    ? chronic cholecystitis; admxn to [facility] 9/12 (CT, USN, HIDA done)  . Atrial fibrillation ([place])   . Cardiomyopathy    echo 9/12: mild LVH, EF 35-40%, mod MR (difficult to judge /post leaflet restricted), mod BAE, mod RVE, mod TR   . Cardiomyopathy ([place])    a. echo 9/12: mild LVH, EF 35-40%, mod MR (difficult to judge /post leaflet restricted), mod BAE, mod RVE, mod TR. b. Echo 11/2011: mild LVH, EF 55-60%, s/p MV repair w/o sig MR/MS, mildly dilated RV/RA, PASP 26mmHg. // AFL dx 2018-11-15 - Echo 11-15-2018: EF 30, diff HK, s/p MV repair with trivial MR, mean MV 7 but not c/w significant mitral stenosis, mild RAE, severe LAE, mod reduced RVSF   . Carotid artery disease ([place])    a. Duplex 06/2015: 50% RECA, 1-39% BICA.  [name] Coronary artery disease    a. s/p CABG 11-15-2011 - LIMA-LAD, SVG-PDA, SVG-OM, SVG-diagonal.  . Dyslipidemia   . Essential hypertension   . Hemifacial spasm   . History of Doppler ultrasound    a. Carotid US [facility] in [place], [name]) 7/17: no hemodynamically significant ICA stenosis  . Mitral regurgitation    a. s/p MV repair November 15, 2011 - on Coumadin for  3 months afterwards then d/c'd by surgery.  . Recurrent upper respiratory infection (URI)   . 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 Stroke [facility])    a. age 78 - in setting of cigs and OCPs. //  b. s/p acute L parietal CVA >> tPA - [facility] in [place], [name]  . Tobacco abuse     Past Surgical History:  Procedure Laterality Date  . CARDIAC CATHETERIZATION    . CARDIOVERSION N/A 01/25/2019   Procedure: CARDIOVERSION;  Surgeon: [name], MD;  Location: [facility] ENDOSCOPY;  Service: Cardiovascular;  Laterality: N/A;  . CARDIOVERSION N/A 06/14/2019   Procedure: CARDIOVERSION;  Surgeon: [name], MD;   Location: [place];  Service: Cardiovascular;  Laterality: N/A;  . CESAREAN SECTION    . cesarean section    . CORONARY ARTERY BYPASS GRAFT  10/21/2011   Procedure: CORONARY ARTERY BYPASS GRAFTING (CABG);  Surgeon: [name], MD;  Location: [place];  Service: Open Heart Surgery;  Laterality: N/A;  cabg x four, using right leg greater saphenous vein harvested endoscopically  . DENTAL SURGERY    . MITRAL VALVE REPAIR  10/21/2011   Procedure: MITRAL VALVE REPAIR (MVR);  Surgeon: [name], MD;  Location: [place];  Service: Open Heart Surgery;  Laterality: N/A;  . PACEMAKER IMPLANT N/A 06/25/2019   Procedure: PACEMAKER IMPLANT;  Surgeon: [name], MD;  Location: [place] CV LAB;  Service: Cardiovascular;  Laterality: N/A;  . TEE WITHOUT CARDIOVERSION  06/30/2011   Procedure: TRANSESOPHAGEAL ECHOCARDIOGRAM (TEE);  Surgeon: [name], MD;  Location: [facility] ENDOSCOPY;  Service: Cardiovascular;  Laterality: N/A;  . TONSILLECTOMY    . TUBAL LIGATION      Current Meds  Medication Sig  . acetaminophen (TYLENOL) 500 MG tablet Take 500-1,000 mg by mouth every 6 (six) hours as needed for headache.  [name] amiodarone (PACERONE) 200 MG tablet Take 1 tablet (200 mg total) by mouth daily.  [name] amLODipine (NORVASC) 10 MG tablet Take 1 tablet (10 mg total) by mouth daily.  [name] atorvastatin (LIPITOR) 80 MG tablet Take 1 tablet (80 mg total) by mouth daily at 6 PM.  . diphenhydrAMINE (BENADRYL) 25 MG tablet Take 25 mg by mouth every 6 (six) hours as needed for allergies.  . famotidine (PEPCID) 20 MG tablet Take 20 mg by mouth daily as needed for indigestion.   . furosemide (LASIX) 20 MG tablet TAKE 1 TABLET BY MOUTH AS NEEDED FOR  SWELLING  . losartan (COZAAR) 50 MG tablet Take 1.5 tablets (75 mg total) by mouth daily.  . Saline 0.2 % SOLN Place 1 spray into both nostrils as needed (for congestion).   . vitamin B-12 (CYANOCOBALAMIN) 1000 MCG tablet Take 1 tablet (1,000 mcg total) by mouth daily.  . vitamin  C (ASCORBIC ACID) 500 MG tablet Take 1,000 mg by mouth 2 (two) times daily.   [name] warfarin (COUMADIN) 5 MG tablet Take as directed by Coumadin Clinic   Current Facility-Administered Medications for the 12/03/19 encounter (Office Visit) with [name], MD  Medication  . incobotulinumtoxinA (XEOMIN) 50 units injection 50 Units  . incobotulinumtoxinA (XEOMIN) 50 units injection 50 Units  . incobotulinumtoxinA (XEOMIN) 50 units injection 50 Units    Allergies  Allergen Reactions  . Azithromycin Nausea Only  . Latex Rash  . Spinach Hives  . Tape Other (See Comments)    PLEASE USE PAPER TAPE!! AFFECTS SKIN BADLY!!!!  Review of Systems negative except from HPI and PMH  Physical Exam BP 138/80   Pulse 60   Ht 5\' 8"  (1.727 m)   Wt 208 lb (94.3 kg)   LMP 05/18/2015 Comment: went for 3 week after skipping two months   SpO2 96%   BMI 31.63 kg/m  Well developed and well nourished in no acute distress HENT normal Neck supple with JVP-flat Clear Device pocket well healed; without hematoma or erythema.  There is no tethering  Regular rate and rhythm, no    murmur Abd-soft with active BS No Clubbing cyanosis   edema Skin-warm and dry A & Oriented  Grossly normal sensory and motor function  ECG  A pacing 21/10/49 RsR'   Assessment and  Plan Atrial fibrillation/flutter with a controlled ventricular rate  Mitral valve prolapse--repair/CABG  Left atriopathy  Obesity  Right ptosis-chronic   Sinus node dysfunction  High Risk Medication Surveillance AMIODARONE  Pacemaker St Jude   Grief sudden death in her husband on 2022-11-25    Holding sinus on amiod  AF burden < 1%  Volume overloaded  2/2 dietary indiscretion in setting of her husbands sudden death  Encouraged less salt  tolerating amio  Needs surveillance labs  Talked about her husband     Current medicines are reviewed at length with the patient today .  The patient does not  have concerns regarding  medicines.

## 2019-12-04 LAB — CBC
Hematocrit: 40.5 % (ref 34.0–46.6)
Hemoglobin: 13.9 g/dL (ref 11.1–15.9)
MCH: 33.9 pg — ABNORMAL HIGH (ref 26.6–33.0)
MCHC: 34.3 g/dL (ref 31.5–35.7)
MCV: 99 fL — ABNORMAL HIGH (ref 79–97)
Platelets: 286 10*3/uL (ref 150–450)
RBC: 4.1 x10E6/uL (ref 3.77–5.28)
RDW: 12.9 % (ref 11.7–15.4)
WBC: 6.8 10*3/uL (ref 3.4–10.8)

## 2019-12-04 LAB — HEPATIC FUNCTION PANEL
ALT: 25 IU/L (ref 0–32)
AST: 27 IU/L (ref 0–40)
Albumin: 5.2 g/dL — ABNORMAL HIGH (ref 3.8–4.9)
Alkaline Phosphatase: 137 IU/L — ABNORMAL HIGH (ref 39–117)
Bilirubin Total: 1.4 mg/dL — ABNORMAL HIGH (ref 0.0–1.2)
Bilirubin, Direct: 0.32 mg/dL (ref 0.00–0.40)
Total Protein: 7.8 g/dL (ref 6.0–8.5)

## 2019-12-04 LAB — TSH: TSH: 1.43 u[IU]/mL (ref 0.450–4.500)

## 2019-12-10 ENCOUNTER — Telehealth: Payer: Self-pay | Admitting: Internal Medicine

## 2019-12-10 NOTE — Telephone Encounter (Signed)
° °  Pt is calling to follow up husband's death certificate, she said he husband is a pat of Dr. Caryl Comes and they've been waiting for him to sign the death certificate. Husband's name Ashley [name] DOB: 04/23/1965, she said she also wanted to get a call back from the nurse   Please call

## 2019-12-11 LAB — CUP PACEART INCLINIC DEVICE CHECK
Battery Remaining Longevity: 128 mo
Battery Voltage: 3.02 V
Brady Statistic RA Percent Paced: 67 %
Brady Statistic RV Percent Paced: 0.49 %
Date Time Interrogation Session: 20210413135100
Implantable Lead Implant Date: 20201103
Implantable Lead Implant Date: 20201103
Implantable Lead Location: 753859
Implantable Lead Location: 753860
Implantable Pulse Generator Implant Date: 20201103
Lead Channel Impedance Value: 425 Ohm
Lead Channel Impedance Value: 587.5 Ohm
Lead Channel Pacing Threshold Amplitude: 0.625 V
Lead Channel Pacing Threshold Amplitude: 0.75 V
Lead Channel Pacing Threshold Pulse Width: 0.5 ms
Lead Channel Pacing Threshold Pulse Width: 0.5 ms
Lead Channel Sensing Intrinsic Amplitude: 12 mV
Lead Channel Sensing Intrinsic Amplitude: 5 mV
Lead Channel Setting Pacing Amplitude: 0.875
Lead Channel Setting Pacing Amplitude: 1.75 V
Lead Channel Setting Pacing Pulse Width: 0.5 ms
Lead Channel Setting Sensing Sensitivity: 2 mV
Pulse Gen Model: 2272
Pulse Gen Serial Number: 9174760

## 2019-12-11 NOTE — Telephone Encounter (Signed)
Phone call and spoke with pt advised Dr Caryl Comes has signed husband's (Beatrice) death certificate and certificate has been returned by funeral home for additional information and signature. Death certificate will be forwarded to Dr Caryl Comes. She verbalizes understanding and agrees with current plan.

## 2019-12-13 ENCOUNTER — Other Ambulatory Visit: Payer: Self-pay

## 2019-12-13 ENCOUNTER — Ambulatory Visit (INDEPENDENT_AMBULATORY_CARE_PROVIDER_SITE_OTHER): Payer: Self-pay | Admitting: *Deleted

## 2019-12-13 DIAGNOSIS — Z8673 Personal history of transient ischemic attack (TIA), and cerebral infarction without residual deficits: Secondary | ICD-10-CM

## 2019-12-13 DIAGNOSIS — I4891 Unspecified atrial fibrillation: Secondary | ICD-10-CM

## 2019-12-13 DIAGNOSIS — Z5181 Encounter for therapeutic drug level monitoring: Secondary | ICD-10-CM

## 2019-12-13 LAB — POCT INR: INR: 2.9 (ref 2.0–3.0)

## 2019-12-13 NOTE — Patient Instructions (Signed)
Description   Continue taking 1 tablet daily except for 1/2 a tablet on Tuesday, Thursday and Saturday. Call Coumadin Clinic [phone] if you have any changes in your medication or upcoming procedures.  Recheck INR in 3 weeks.

## 2019-12-24 ENCOUNTER — Other Ambulatory Visit: Payer: Self-pay | Admitting: Cardiology

## 2019-12-24 ENCOUNTER — Ambulatory Visit (INDEPENDENT_AMBULATORY_CARE_PROVIDER_SITE_OTHER): Payer: Self-pay | Admitting: *Deleted

## 2019-12-24 DIAGNOSIS — I495 Sick sinus syndrome: Secondary | ICD-10-CM

## 2019-12-24 LAB — CUP PACEART REMOTE DEVICE CHECK
Battery Remaining Longevity: 124 mo
Battery Remaining Percentage: 95.5 %
Battery Voltage: 3.02 V
Brady Statistic AP VP Percent: 1 %
Brady Statistic AP VS Percent: 55 %
Brady Statistic AS VP Percent: 1 %
Brady Statistic AS VS Percent: 44 %
Brady Statistic RA Percent Paced: 53 %
Brady Statistic RV Percent Paced: 1 %
Date Time Interrogation Session: 20210504020013
Implantable Lead Implant Date: 20201103
Implantable Lead Implant Date: 20201103
Implantable Lead Location: 753859
Implantable Lead Location: 753860
Implantable Pulse Generator Implant Date: 20201103
Lead Channel Impedance Value: 440 Ohm
Lead Channel Impedance Value: 590 Ohm
Lead Channel Pacing Threshold Amplitude: 0.75 V
Lead Channel Pacing Threshold Amplitude: 0.875 V
Lead Channel Pacing Threshold Pulse Width: 0.5 ms
Lead Channel Pacing Threshold Pulse Width: 0.5 ms
Lead Channel Sensing Intrinsic Amplitude: 12 mV
Lead Channel Sensing Intrinsic Amplitude: 5 mV
Lead Channel Setting Pacing Amplitude: 1 V
Lead Channel Setting Pacing Amplitude: 1.875
Lead Channel Setting Pacing Pulse Width: 0.5 ms
Lead Channel Setting Sensing Sensitivity: 2 mV
Pulse Gen Model: 2272
Pulse Gen Serial Number: 9174760

## 2019-12-25 NOTE — Progress Notes (Signed)
Remote pacemaker transmission.   

## 2020-01-03 ENCOUNTER — Other Ambulatory Visit: Payer: Self-pay

## 2020-01-03 ENCOUNTER — Ambulatory Visit (INDEPENDENT_AMBULATORY_CARE_PROVIDER_SITE_OTHER): Payer: Self-pay

## 2020-01-03 DIAGNOSIS — I4891 Unspecified atrial fibrillation: Secondary | ICD-10-CM

## 2020-01-03 DIAGNOSIS — Z5181 Encounter for therapeutic drug level monitoring: Secondary | ICD-10-CM

## 2020-01-03 DIAGNOSIS — Z8673 Personal history of transient ischemic attack (TIA), and cerebral infarction without residual deficits: Secondary | ICD-10-CM

## 2020-01-03 LAB — POCT INR: INR: 3.6 — AB (ref 2.0–3.0)

## 2020-01-03 NOTE — Patient Instructions (Signed)
Description   Skip today's dosage of Coumadin, then start taking 1/2 tablet daily except 1 tablet on Mondays, Wednesdays and Fridays.  Call Coumadin Clinic [phone] if you have any changes in your medication or upcoming procedures.  Recheck INR in 3 weeks.

## 2020-01-10 ENCOUNTER — Other Ambulatory Visit: Payer: Self-pay | Admitting: Internal Medicine

## 2020-01-13 ENCOUNTER — Telehealth: Payer: Self-pay | Admitting: *Deleted

## 2020-01-13 MED ORDER — WARFARIN SODIUM 5 MG PO TABS: ORAL_TABLET | ORAL | 2 refills | Status: DC

## 2020-01-13 NOTE — Telephone Encounter (Signed)
Pt would like a refill of Lipitor.   Thank you!

## 2020-01-13 NOTE — Telephone Encounter (Signed)
Pt's medication has already been sent to pt's pharmacy today. Confirmation received.

## 2020-01-24 ENCOUNTER — Other Ambulatory Visit: Payer: Self-pay

## 2020-01-24 ENCOUNTER — Ambulatory Visit (INDEPENDENT_AMBULATORY_CARE_PROVIDER_SITE_OTHER): Payer: Self-pay

## 2020-01-24 DIAGNOSIS — Z5181 Encounter for therapeutic drug level monitoring: Secondary | ICD-10-CM

## 2020-01-24 DIAGNOSIS — Z8673 Personal history of transient ischemic attack (TIA), and cerebral infarction without residual deficits: Secondary | ICD-10-CM

## 2020-01-24 DIAGNOSIS — I4891 Unspecified atrial fibrillation: Secondary | ICD-10-CM

## 2020-01-24 LAB — POCT INR: INR: 1.7 — AB (ref 2.0–3.0)

## 2020-01-24 NOTE — Patient Instructions (Signed)
Description   Take 1.5 tablets today, then start taking 1 tablet daily except 1/2 tablet on Tuesdays, Thursdays, and Saturdays.  Call Coumadin Clinic [phone] if you have any changes in your medication or upcoming procedures.  Recheck INR in 3 weeks.

## 2020-02-14 ENCOUNTER — Ambulatory Visit (INDEPENDENT_AMBULATORY_CARE_PROVIDER_SITE_OTHER): Payer: Self-pay | Admitting: Pharmacist

## 2020-02-14 ENCOUNTER — Other Ambulatory Visit: Payer: Self-pay

## 2020-02-14 DIAGNOSIS — Z8673 Personal history of transient ischemic attack (TIA), and cerebral infarction without residual deficits: Secondary | ICD-10-CM

## 2020-02-14 DIAGNOSIS — Z5181 Encounter for therapeutic drug level monitoring: Secondary | ICD-10-CM

## 2020-02-14 DIAGNOSIS — I4891 Unspecified atrial fibrillation: Secondary | ICD-10-CM

## 2020-02-14 LAB — POCT INR: INR: 2.4 (ref 2.0–3.0)

## 2020-02-14 NOTE — Patient Instructions (Signed)
Description   Take 1 tablet daily except 1/2 tablet on Tuesdays, Thursdays, and Saturdays.  Call Coumadin Clinic [phone] if you have any changes in your medication or upcoming procedures.  Recheck INR in 4 weeks.

## 2020-02-26 ENCOUNTER — Telehealth: Payer: Self-pay | Admitting: Internal Medicine

## 2020-02-26 MED ORDER — FUROSEMIDE 20 MG PO TABS: ORAL_TABLET | ORAL | 0 refills | Status: DC

## 2020-02-26 NOTE — Telephone Encounter (Signed)
Pt's medication was sent to pt's pharmacy as requested. Confirmation received.  °

## 2020-02-26 NOTE — Telephone Encounter (Signed)
*  STAT* If patient is at the pharmacy, call can be transferred to refill team.   1. Which medications need to be refilled? (please list name of each medication and dose if known)  furosemide (LASIX) 20 MG tablet  2. Which pharmacy/location (including street and city if local pharmacy) is medication to be sent to? [facility] [place]   3. Do they need a 30 day or 90 day supply? 90 day supply

## 2020-03-13 ENCOUNTER — Other Ambulatory Visit: Payer: Self-pay

## 2020-03-13 ENCOUNTER — Ambulatory Visit (INDEPENDENT_AMBULATORY_CARE_PROVIDER_SITE_OTHER): Payer: Self-pay

## 2020-03-13 DIAGNOSIS — Z8673 Personal history of transient ischemic attack (TIA), and cerebral infarction without residual deficits: Secondary | ICD-10-CM

## 2020-03-13 DIAGNOSIS — Z5181 Encounter for therapeutic drug level monitoring: Secondary | ICD-10-CM

## 2020-03-13 DIAGNOSIS — I4891 Unspecified atrial fibrillation: Secondary | ICD-10-CM

## 2020-03-13 LAB — POCT INR: INR: 3.2 — AB (ref 2.0–3.0)

## 2020-03-13 NOTE — Patient Instructions (Signed)
Hold today and then continue taking 1 tablet daily except 1/2 tablet on Tuesdays, Thursdays, and Saturdays.  Call Coumadin Clinic [phone] if you have any changes in your medication or upcoming procedures.  Recheck INR in 3 weeks.

## 2020-03-16 ENCOUNTER — Encounter: Payer: Self-pay | Admitting: Family Medicine

## 2020-03-16 ENCOUNTER — Ambulatory Visit: Payer: Self-pay | Attending: Family Medicine | Admitting: Family Medicine

## 2020-03-16 ENCOUNTER — Ambulatory Visit (HOSPITAL_BASED_OUTPATIENT_CLINIC_OR_DEPARTMENT_OTHER): Payer: Self-pay | Admitting: Licensed Clinical Social Worker

## 2020-03-16 ENCOUNTER — Other Ambulatory Visit: Payer: Self-pay

## 2020-03-16 VITALS — BP 149/60 | HR 60 | Temp 97.7°F | Ht 68.0 in | Wt 208.0 lb

## 2020-03-16 DIAGNOSIS — Z634 Disappearance and death of family member: Secondary | ICD-10-CM

## 2020-03-16 DIAGNOSIS — I10 Essential (primary) hypertension: Secondary | ICD-10-CM

## 2020-03-16 DIAGNOSIS — Z598 Other problems related to housing and economic circumstances: Secondary | ICD-10-CM

## 2020-03-16 DIAGNOSIS — I495 Sick sinus syndrome: Secondary | ICD-10-CM

## 2020-03-16 DIAGNOSIS — I4819 Other persistent atrial fibrillation: Secondary | ICD-10-CM

## 2020-03-16 DIAGNOSIS — R7303 Prediabetes: Secondary | ICD-10-CM

## 2020-03-16 DIAGNOSIS — Z79899 Other long term (current) drug therapy: Secondary | ICD-10-CM

## 2020-03-16 DIAGNOSIS — Z599 Problem related to housing and economic circumstances, unspecified: Secondary | ICD-10-CM

## 2020-03-16 LAB — POCT GLYCOSYLATED HEMOGLOBIN (HGB A1C): Hemoglobin A1C: 5.1 % (ref 4.0–5.6)

## 2020-03-16 LAB — GLUCOSE, POCT (MANUAL RESULT ENTRY): POC Glucose: 164 mg/dl — AB (ref 70–99)

## 2020-03-16 NOTE — Progress Notes (Signed)
Subjective:  Patient ID: [place], female    DOB: 11-26-1965  Age: 54 y.o. MRN: [id number]  CC: Follow-up (Pt. is here for a follow up. Pt. would like to talk to PCP regarding a form for her disablility. )   HPI Ashley [name]  is a 54 year old female with medical historysignificant for hypertension,previous CVA,CAD status post CABG, MVP s/pmitral valve repair, atrial fibrillation failedTikosyn treatmentdue to prolonged QTC,DCCV in10/2020 with period of asystole, junctional rhythm in the 20s which was treated with atropine and ephedrineanddopamine for hypotension, sinus node dysfunction status post pacemaker placement in 06/2019. Last seen by Dr. Caryl Comes for follow-up of her A. fib in 11/2019 with recommendations to follow-up in 6 months. Currently on amiodarone and last thyroid panel was normal. Also on chronic anticoagulation with Coumadin and last INR was 3.2; INR is monitored by the Coumadin clinic. Pacemaker was last interrogated in 01-20-20  Husband dies suddenly of a heart attack in 11/2019. Her kids are helping with her rent now as her husband was her main sole of income.  She has questions about the process for applying for disability as she would like to do that now. She has no depression or anxiety and states she is currently in the anger phase of bereavement. Denies presence of shortness of breath, chest pain, pedal edema and she has a good exercise tolerance.  Past Medical History:  Diagnosis Date   Abdominal pain    ? chronic cholecystitis; admxn to [facility] 9/12 (CT, USN, HIDA done)   Atrial fibrillation ([place])    Cardiomyopathy    echo 9/12: mild LVH, EF 35-40%, mod MR (difficult to judge /post leaflet restricted), mod BAE, mod RVE, mod TR    Cardiomyopathy ([place])    a. echo 9/12: mild LVH, EF 35-40%, mod MR (difficult to judge /post leaflet restricted), mod BAE, mod RVE, mod TR. b. Echo 11/2011: mild LVH, EF 55-60%, s/p MV repair w/o sig MR/MS, mildly  dilated RV/RA, PASP 61mmHg. // AFL dx 10/2018 - Echo 10/2018: EF 30, diff HK, s/p MV repair with trivial MR, mean MV 7 but not c/w significant mitral stenosis, mild RAE, severe LAE, mod reduced RVSF    Carotid artery disease ([place])    a. Duplex 06/2015: 50% RECA, 1-39% BICA.   Coronary artery disease    a. s/p CABG 10/2011 - LIMA-LAD, SVG-PDA, SVG-OM, SVG-diagonal.   Dyslipidemia    Essential hypertension    Hemifacial spasm    History of Doppler ultrasound    a. Carotid US [facility] in [place], [name]) 7/17: no hemodynamically significant ICA stenosis   Mitral regurgitation    a. s/p MV repair 10/2011 - on Coumadin for 3 months afterwards then d/c'd by surgery.   Recurrent upper respiratory infection (URI)    S/P CABG x 4 10/21/2011   S/P mitral valve repair 10/21/2011   Stroke [facility])    a. age 38 - in setting of cigs and OCPs. //  b. s/p acute L parietal CVA >> tPA - [facility] in [place], [name]   Tobacco abuse     Past Surgical History:  Procedure Laterality Date   CARDIAC CATHETERIZATION     CARDIOVERSION N/A 01/25/2019   Procedure: CARDIOVERSION;  Surgeon: [name], MD;  Location: [facility] ENDOSCOPY;  Service: Cardiovascular;  Laterality: N/A;   CARDIOVERSION N/A 06/14/2019   Procedure: CARDIOVERSION;  Surgeon: [name], MD;  Location: [place];  Service: Cardiovascular;  Laterality: N/A;  CESAREAN SECTION     cesarean section     CORONARY ARTERY BYPASS GRAFT  10/21/2011   Procedure: CORONARY ARTERY BYPASS GRAFTING (CABG);  Surgeon: [name], MD;  Location: [place];  Service: Open Heart Surgery;  Laterality: N/A;  cabg x four, using right leg greater saphenous vein harvested endoscopically   DENTAL SURGERY     MITRAL VALVE REPAIR  10/21/2011   Procedure: MITRAL VALVE REPAIR (MVR);  Surgeon: [name], MD;  Location: [place];  Service: Open Heart Surgery;  Laterality: N/A;   PACEMAKER IMPLANT N/A 06/25/2019   Procedure: PACEMAKER  IMPLANT;  Surgeon: [name], MD;  Location: [place] CV LAB;  Service: Cardiovascular;  Laterality: N/A;   TEE WITHOUT CARDIOVERSION  06/30/2011   Procedure: TRANSESOPHAGEAL ECHOCARDIOGRAM (TEE);  Surgeon: [name], MD;  Location: [facility] ENDOSCOPY;  Service: Cardiovascular;  Laterality: N/A;   TONSILLECTOMY     TUBAL LIGATION      Family History  Problem Relation Age of Onset   Hypertension Mother    Stroke Mother    Heart disease Father    COPD Father    Anesthesia problems Neg Hx    Hypotension Neg Hx    Malignant hyperthermia Neg Hx    Pseudochol deficiency Neg Hx     Allergies  Allergen Reactions   Azithromycin Nausea Only   Latex Rash   Spinach Hives   Tape Other (See Comments)    PLEASE USE PAPER TAPE!! AFFECTS SKIN BADLY!!!!    Outpatient Medications Prior to Visit  Medication Sig Dispense Refill   acetaminophen (TYLENOL) 500 MG tablet Take 500-1,000 mg by mouth every 6 (six) hours as needed for headache.     amiodarone (PACERONE) 200 MG tablet Take 1 tablet (200 mg total) by mouth daily. 90 tablet 3   amLODipine (NORVASC) 10 MG tablet Take 1 tablet (10 mg total) by mouth daily. 90 tablet 3   atorvastatin (LIPITOR) 80 MG tablet Take 1 tablet (80 mg total) by mouth daily. Please make yearly appt with Dr. Harrington Challenger for August before anymore refills. 1st attempt 90 tablet 0   diphenhydrAMINE (BENADRYL) 25 MG tablet Take 25 mg by mouth every 6 (six) hours as needed for allergies.     famotidine (PEPCID) 20 MG tablet Take 20 mg by mouth daily as needed for indigestion.      furosemide (LASIX) 20 MG tablet TAKE 1 TABLET BY MOUTH AS NEEDED FOR  SWELLING. Please make yearly appt with Dr. Harrington Challenger for August before anymore refills. 1st attempt 90 tablet 0   losartan (COZAAR) 50 MG tablet Take 1.5 tablets (75 mg total) by mouth daily. 135 tablet 3   Saline 0.2 % SOLN Place 1 spray into both nostrils as needed (for congestion).      vitamin B-12  (CYANOCOBALAMIN) 1000 MCG tablet Take 1 tablet (1,000 mcg total) by mouth daily.     vitamin C (ASCORBIC ACID) 500 MG tablet Take 1,000 mg by mouth 2 (two) times daily.      warfarin (COUMADIN) 5 MG tablet Take 1/2 a tablet to 1 tablet by mouth daily as directed by Coumadin Clinic 30 tablet 2   Facility-Administered Medications Prior to Visit  Medication Dose Route Frequency Provider Last Rate Last Admin   incobotulinumtoxinA (XEOMIN) 50 units injection 50 Units  50 Units Intramuscular Q90 days Marcial Pacas, MD   50 Units at 11/30/16 1546   incobotulinumtoxinA (XEOMIN) 50 units injection 50 Units  50 Units Intramuscular Q90  days Marcial Pacas, MD   50 Units at 07/25/17 0843   incobotulinumtoxinA (XEOMIN) 50 units injection 50 Units  50 Units Intramuscular Q90 days Marcial Pacas, MD   50 Units at 07/26/17 1217     ROS Review of Systems  Constitutional: Negative for activity change, appetite change and fatigue.  HENT: Negative for congestion, sinus pressure and sore throat.   Eyes: Negative for visual disturbance.  Respiratory: Negative for cough, chest tightness, shortness of breath and wheezing.   Cardiovascular: Negative for chest pain and palpitations.  Gastrointestinal: Negative for abdominal distention, abdominal pain and constipation.  Endocrine: Negative for polydipsia.  Genitourinary: Negative for dysuria and frequency.  Musculoskeletal: Negative for arthralgias and back pain.  Skin: Negative for rash.  Neurological: Negative for tremors, light-headedness and numbness.  Hematological: Does not bruise/bleed easily.  Psychiatric/Behavioral: Negative for agitation and behavioral problems.    Objective:  BP (!) 149/60 (BP Location: Left Arm, Patient Position: Sitting, Cuff Size: Large)    Pulse 60    Temp 97.7 F (36.5 C) (Temporal)    Ht 5\' 8"  (1.727 m)    Wt (!) 208 lb (94.3 kg)    LMP 05/18/2015 Comment: went for 3 week after skipping two months    SpO2 99%    BMI 31.63 kg/m    BP/Weight 03/16/2020 12/03/2019 2/95/6213  Systolic BP 086 578 469  Diastolic BP 60 80 70  Wt. (Lbs) 208 208 -  BMI 31.63 31.63 -      Physical Exam Constitutional:      Appearance: She is well-developed.  Neck:     Vascular: No JVD.  Cardiovascular:     Rate and Rhythm: Normal rate.     Heart sounds: Normal heart sounds. No murmur heard.   Pulmonary:     Effort: Pulmonary effort is normal.     Breath sounds: Normal breath sounds. No wheezing or rales.  Chest:     Chest wall: No tenderness.  Abdominal:     General: Bowel sounds are normal. There is no distension.     Palpations: Abdomen is soft. There is no mass.     Tenderness: There is no abdominal tenderness.  Musculoskeletal:        General: Normal range of motion.     Right lower leg: No edema.     Left lower leg: No edema.  Neurological:     Mental Status: She is alert and oriented to person, place, and time.  Psychiatric:        Mood and Affect: Mood normal.     CMP Latest Ref Rng & Units 12/03/2019 10/09/2019 07/29/2019  Glucose 65 - 99 mg/dL - 102(H) 93  BUN 6 - 24 mg/dL - 12 11  Creatinine 0.57 - 1.00 mg/dL - 1.01(H) 0.90  Sodium 134 - 144 mmol/L - 140 141  Potassium 3.5 - 5.2 mmol/L - 4.7 3.9  Chloride 96 - 106 mmol/L - 103 100  CO2 20 - 29 mmol/L - 21 25  Calcium 8.7 - 10.2 mg/dL - 9.8 9.3  Total Protein 6.0 - 8.5 g/dL 7.8 - -  Total Bilirubin 0.0 - 1.2 mg/dL 1.4(H) - -  Alkaline Phos 39 - 117 IU/L 137(H) - -  AST 0 - 40 IU/L 27 - -  ALT 0 - 32 IU/L 25 - -    Lipid Panel     Component Value Date/Time   CHOL 114 11/16/2017 0838   TRIG 68 11/16/2017 0838   HDL 43 11/16/2017  0838   CHOLHDL 2.7 11/16/2017 0838   CHOLHDL 3.8 07/01/2016 0939   VLDL 25 07/01/2016 0939   LDLCALC 57 11/16/2017 0838    CBC    Component Value Date/Time   WBC 6.8 12/03/2019 1431   WBC 6.3 06/25/2019 1239   RBC 4.10 12/03/2019 1431   RBC 4.28 06/25/2019 1239   HGB 13.9 12/03/2019 1431   HCT 40.5 12/03/2019 1431    PLT 286 12/03/2019 1431   MCV 99 (H) 12/03/2019 1431   MCH 33.9 (H) 12/03/2019 1431   MCH 32.5 06/25/2019 1239   MCHC 34.3 12/03/2019 1431   MCHC 32.5 06/25/2019 1239   RDW 12.9 12/03/2019 1431   LYMPHSABS 1.0 06/25/2019 1239   MONOABS 0.7 06/25/2019 1239   EOSABS 0.2 06/25/2019 1239   BASOSABS 0.1 06/25/2019 1239    Lab Results  Component Value Date   HGBA1C 5.3 09/30/2019    Assessment & Plan:   1. Prediabetes A1c of 5.3 Diet controlled - Glucose (CBG) - HgB A1c  2. Bereavement Brief session of psychotherapy provided in the clinic She has support at this time and does not need additional management  3. Other persistent atrial fibrillation (HCC) On chronic anticoagulation with Coumadin; last INR was supratherapeutic at 3.2 Follow-up with the Coumadin clinic for adjustment of regimen Continue with amiodarone Follow-up with electrophysiology  4. Sinus node dysfunction (HCC) Status post pacemaker Monitored by cardiology  5. Essential hypertension Slightly above goal with blood pressure of 149/60 Blood pressure was 138/80 at her last visit hence I will make no regimen changes today Continue losartan, amiodarone Counseled on blood pressure goal of less than 130/80, low-sodium, DASH diet, medication compliance, 150 minutes of moderate intensity exercise per week. Discussed medication compliance, adverse effects.  6. High risk medication use High risk medications include amiodarone and Coumadin We will need to keep an eye on her thyroid panel-last thyroid panel from 11/2018 was normal PFTs to evaluate for pulmonary fibrosis was performed 07/2019   Return in about 6 months (around 09/16/2020) for Chronic disease management.   [name], MD, FAAFP. [facility] and [place], [place]   03/16/2020, 3:55 PM

## 2020-03-24 ENCOUNTER — Ambulatory Visit (INDEPENDENT_AMBULATORY_CARE_PROVIDER_SITE_OTHER): Payer: Self-pay | Admitting: *Deleted

## 2020-03-24 DIAGNOSIS — I495 Sick sinus syndrome: Secondary | ICD-10-CM

## 2020-03-24 LAB — CUP PACEART REMOTE DEVICE CHECK
Battery Remaining Longevity: 119 mo
Battery Remaining Percentage: 95.5 %
Battery Voltage: 3.02 V
Brady Statistic AP VP Percent: 1.1 %
Brady Statistic AP VS Percent: 51 %
Brady Statistic AS VP Percent: 3.1 %
Brady Statistic AS VS Percent: 43 %
Brady Statistic RA Percent Paced: 41 %
Brady Statistic RV Percent Paced: 4.2 %
Date Time Interrogation Session: 20210803020018
Implantable Lead Implant Date: 20201103
Implantable Lead Implant Date: 20201103
Implantable Lead Location: 753859
Implantable Lead Location: 753860
Implantable Pulse Generator Implant Date: 20201103
Lead Channel Impedance Value: 430 Ohm
Lead Channel Impedance Value: 610 Ohm
Lead Channel Pacing Threshold Amplitude: 0.625 V
Lead Channel Pacing Threshold Amplitude: 0.875 V
Lead Channel Pacing Threshold Pulse Width: 0.5 ms
Lead Channel Pacing Threshold Pulse Width: 0.5 ms
Lead Channel Sensing Intrinsic Amplitude: 12 mV
Lead Channel Sensing Intrinsic Amplitude: 5 mV
Lead Channel Setting Pacing Amplitude: 0.875
Lead Channel Setting Pacing Amplitude: 1.875
Lead Channel Setting Pacing Pulse Width: 0.5 ms
Lead Channel Setting Sensing Sensitivity: 2 mV
Pulse Gen Model: 2272
Pulse Gen Serial Number: 9174760

## 2020-03-24 NOTE — BH Specialist Note (Signed)
[place] Initial Visit  MRN: [id number] Name: [place]  Number of [place] Clinician visits:: 1/6 Session Start time: 4:18 p.m. session End time: 4: 38 PM Total time: 15  Type of Service: [place] Interpretor:No. Interpretor Name and Language: NA   Warm Hand Off Completed.       SUBJECTIVE: Ashley [name] is a 54 y.o. female accompanied by Self Patient was referred by Dr. Margarita Rana for bereavement and resources. Patient reports the following symptoms/concerns: Patient is interested in applying for disability due to chronic medical conditions.  Patient shared that spouse passed April 2021 unexpectedly Duration of problem: Ongoing; Severity of problem: NA  OBJECTIVE: Mood: Pleasant and Affect: Appropriate Risk of harm to self or others: No plan to harm self or others  LIFE CONTEXT: Family and Social: Patient receives support from 2 adult children, who she resides with School/Work: Patient is interested in applying for disability.  She obtains food stamps Self-Care: Patient was successful in identifying healthy coping skills such as walking, listening to music, and spending time with a 12-year-old neighbor Life Changes: Patient reports ongoing psychosocial stressors related to medical health conditions  GOALS ADDRESSED: Patient will: 1. Reduce symptoms of: stress 2. Increase knowledge and/or ability of: coping skills  3. Demonstrate ability to: Increase healthy adjustment to current life circumstances  INTERVENTIONS: Interventions utilized: Solution-Focused Strategies, Supportive Counseling, Psychoeducation and/or Health Education and Link to [employer]  Standardized Assessments completed: GAD-7 and PHQ 2&9  ASSESSMENT: Patient currently experiencing psychosocial stressors related to chronic medical health conditions and financial strain.  Patient is currently grieving the passing  of spouse.  She received strong support from adult children.    Patient may benefit from supportive resources.  LCSW discussed correlation between physical health conditions and stress.  Patient was successful in identifying healthy coping skills and appeared to have good insight.  Supportive resources on how to apply for disability and financial counseling were discussed and provided.  PLAN: 1. Follow up with behavioral health clinician on : Contact LCSW with any additional behavioral health and/or resource needs 2. Behavioral recommendations: Utilize healthy coping skills and supportive resources provided 3. Referral(s): [place] (In Clinic) 4. "From scale of 1-10, how likely are you to follow plan?":   Rebekah [place], LCSW 03/24/2020 10:38 AM

## 2020-03-25 ENCOUNTER — Other Ambulatory Visit: Payer: Self-pay

## 2020-03-25 MED ORDER — LOSARTAN POTASSIUM 50 MG PO TABS: 75.0000 mg | ORAL_TABLET | Freq: Every day | ORAL | 2 refills | Status: DC

## 2020-03-26 NOTE — Progress Notes (Signed)
Remote pacemaker transmission.   

## 2020-04-02 ENCOUNTER — Ambulatory Visit (INDEPENDENT_AMBULATORY_CARE_PROVIDER_SITE_OTHER): Payer: Self-pay

## 2020-04-02 ENCOUNTER — Other Ambulatory Visit: Payer: Self-pay

## 2020-04-02 DIAGNOSIS — Z8673 Personal history of transient ischemic attack (TIA), and cerebral infarction without residual deficits: Secondary | ICD-10-CM

## 2020-04-02 DIAGNOSIS — I4891 Unspecified atrial fibrillation: Secondary | ICD-10-CM

## 2020-04-02 DIAGNOSIS — Z5181 Encounter for therapeutic drug level monitoring: Secondary | ICD-10-CM

## 2020-04-02 LAB — POCT INR: INR: 2 (ref 2.0–3.0)

## 2020-04-02 NOTE — Patient Instructions (Signed)
Description   Continue taking 1 tablet daily except 1/2 tablet on Tuesdays, Thursdays, and Saturdays.  Call Coumadin Clinic [phone] if you have any changes in your medication or upcoming procedures.  Recheck INR in 4 weeks.

## 2020-04-08 ENCOUNTER — Other Ambulatory Visit: Payer: Self-pay

## 2020-04-08 MED ORDER — ATORVASTATIN CALCIUM 80 MG PO TABS: 80.0000 mg | ORAL_TABLET | Freq: Every day | ORAL | 2 refills | Status: DC

## 2020-05-04 ENCOUNTER — Ambulatory Visit (INDEPENDENT_AMBULATORY_CARE_PROVIDER_SITE_OTHER): Payer: Self-pay

## 2020-05-04 ENCOUNTER — Other Ambulatory Visit: Payer: Self-pay

## 2020-05-04 DIAGNOSIS — I4891 Unspecified atrial fibrillation: Secondary | ICD-10-CM

## 2020-05-04 DIAGNOSIS — Z8673 Personal history of transient ischemic attack (TIA), and cerebral infarction without residual deficits: Secondary | ICD-10-CM

## 2020-05-04 DIAGNOSIS — Z5181 Encounter for therapeutic drug level monitoring: Secondary | ICD-10-CM

## 2020-05-04 LAB — POCT INR: INR: 1.4 — AB (ref 2.0–3.0)

## 2020-05-04 NOTE — Patient Instructions (Signed)
Description   Take 1 and 1/2 tablets today and 1 tablet tomorrow, then continue taking 1 tablet daily except 1/2 tablet on Tuesdays, Thursdays, and Saturdays.  Call Coumadin Clinic [phone] if you have any changes in your medication or upcoming procedures.  Recheck INR in 2 weeks.

## 2020-05-07 ENCOUNTER — Other Ambulatory Visit: Payer: Self-pay | Admitting: Internal Medicine

## 2020-05-07 ENCOUNTER — Other Ambulatory Visit: Payer: Self-pay | Admitting: *Deleted

## 2020-05-07 ENCOUNTER — Telehealth: Payer: Self-pay | Admitting: Internal Medicine

## 2020-05-07 MED ORDER — FUROSEMIDE 20 MG PO TABS: ORAL_TABLET | ORAL | 2 refills | Status: DC

## 2020-05-07 MED ORDER — WARFARIN SODIUM 5 MG PO TABS: ORAL_TABLET | ORAL | 2 refills | Status: DC

## 2020-05-07 MED ORDER — AMLODIPINE BESYLATE 10 MG PO TABS
10.0000 mg | ORAL_TABLET | Freq: Every day | ORAL | 2 refills | Status: DC
Start: 2020-05-07 — End: 2021-01-15

## 2020-05-07 NOTE — Telephone Encounter (Signed)
Spoke with [facility] pharmacist who would like to clarify pt's Furosemide is once daily and to notify providers of interaction between Amiodarone and Warfarin.  Pharmacy advised pt's Furosemide is ordered as 20mg  - 1 tablet daily by mouth.  Will forward information to Pharm-D for review and recommendation of Amiodarone and Warfarin.

## 2020-05-07 NOTE — Telephone Encounter (Signed)
New Message  [facility] is calling to clarify the Directions for  furosemide (LASIX) 20 MG tablet  Please call

## 2020-05-08 NOTE — Telephone Encounter (Signed)
I called and left VM on [facility] machine that we are aware of amio/warfarin drug interaction and it is being managed by our coumadin clinic

## 2020-05-08 NOTE — Telephone Encounter (Signed)
Patient has been on amiodarone and warfain for a long time. Interaction is being managed by coumadin clinic.There is no changes that need to be made.

## 2020-05-13 NOTE — Telephone Encounter (Signed)
MM  thx

## 2020-05-19 ENCOUNTER — Other Ambulatory Visit: Payer: Self-pay

## 2020-05-19 ENCOUNTER — Ambulatory Visit (INDEPENDENT_AMBULATORY_CARE_PROVIDER_SITE_OTHER): Payer: Self-pay | Admitting: Pharmacist

## 2020-05-19 DIAGNOSIS — Z8673 Personal history of transient ischemic attack (TIA), and cerebral infarction without residual deficits: Secondary | ICD-10-CM

## 2020-05-19 DIAGNOSIS — Z5181 Encounter for therapeutic drug level monitoring: Secondary | ICD-10-CM

## 2020-05-19 DIAGNOSIS — I4891 Unspecified atrial fibrillation: Secondary | ICD-10-CM

## 2020-05-19 LAB — POCT INR: INR: 2 (ref 2.0–3.0)

## 2020-05-19 NOTE — Patient Instructions (Addendum)
Description   Continue taking 1 tablet daily except 1/2 tablet on Tuesdays, Thursdays, and Saturdays.  Call Coumadin Clinic [phone] if you have any changes in your medication or upcoming procedures.  Recheck INR in 3 weeks.

## 2020-05-27 ENCOUNTER — Other Ambulatory Visit: Payer: Self-pay

## 2020-05-27 MED ORDER — AMIODARONE HCL 200 MG PO TABS
200.0000 mg | ORAL_TABLET | Freq: Every day | ORAL | 1 refills | Status: DC
Start: 2020-05-27 — End: 2020-12-11

## 2020-06-03 NOTE — Progress Notes (Signed)
Electrophysiology Office Note Date: 06/04/2020  ID:  [place], DOB [birth year], MRN [id number]  PCP: [name], MD Electrophysiologist: Caryl Comes  CC: Pacemaker follow-up  Ashley [name] is a 54 y.o. female seen today for Dr Caryl Comes.  She presents today for routine electrophysiology followup.  Since last being seen in our clinic, the patient reports doing very well.  She denies chest pain, palpitations, dyspnea, PND, orthopnea, nausea, vomiting, dizziness, syncope, edema, weight gain, or early satiety.  Device History: STJ dual chamber PPM implanted 2020 for SND   Past Medical History:  Diagnosis Date  . Abdominal pain    ? chronic cholecystitis; admxn to [facility] 9/12 (CT, USN, HIDA done)  . Atrial fibrillation ([place])   . Cardiomyopathy    echo 9/12: mild LVH, EF 35-40%, mod MR (difficult to judge /post leaflet restricted), mod BAE, mod RVE, mod TR   . Cardiomyopathy ([place])    a. echo 9/12: mild LVH, EF 35-40%, mod MR (difficult to judge /post leaflet restricted), mod BAE, mod RVE, mod TR. b. Echo 11/2011: mild LVH, EF 55-60%, s/p MV repair w/o sig MR/MS, mildly dilated RV/RA, PASP 17mmHg. // AFL dx 10/2018 - Echo 10/2018: EF 30, diff HK, s/p MV repair with trivial MR, mean MV 7 but not c/w significant mitral stenosis, mild RAE, severe LAE, mod reduced RVSF   . Carotid artery disease ([place])    a. Duplex 06/2015: 50% RECA, 1-39% BICA.  [name] Coronary artery disease    a. s/p CABG 10/2011 - LIMA-LAD, SVG-PDA, SVG-OM, SVG-diagonal.  . Dyslipidemia   . Essential hypertension   . Hemifacial spasm   . History of Doppler ultrasound    a. Carotid US [facility] in [place], [name]) 7/17: no hemodynamically significant ICA stenosis  . Mitral regurgitation    a. s/p MV repair 10/2011 - on Coumadin for 3 months afterwards then d/c'd by surgery.  . Recurrent upper respiratory infection (URI)   . 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 Stroke [facility])    a.  age 3 - in setting of cigs and OCPs. //  b. s/p acute L parietal CVA >> tPA - [facility] in [place], [name]  . Tobacco abuse    Past Surgical History:  Procedure Laterality Date  . CARDIAC CATHETERIZATION    . CARDIOVERSION N/A 01/25/2019   Procedure: CARDIOVERSION;  Surgeon: [name], MD;  Location: [facility] ENDOSCOPY;  Service: Cardiovascular;  Laterality: N/A;  . CARDIOVERSION N/A 06/14/2019   Procedure: CARDIOVERSION;  Surgeon: [name], MD;  Location: [place];  Service: Cardiovascular;  Laterality: N/A;  . CESAREAN SECTION    . cesarean section    . CORONARY ARTERY BYPASS GRAFT  10/21/2011   Procedure: CORONARY ARTERY BYPASS GRAFTING (CABG);  Surgeon: [name], MD;  Location: [place];  Service: Open Heart Surgery;  Laterality: N/A;  cabg x four, using right leg greater saphenous vein harvested endoscopically  . DENTAL SURGERY    . MITRAL VALVE REPAIR  10/21/2011   Procedure: MITRAL VALVE REPAIR (MVR);  Surgeon: [name], MD;  Location: [place];  Service: Open Heart Surgery;  Laterality: N/A;  . PACEMAKER IMPLANT N/A 06/25/2019   Procedure: PACEMAKER IMPLANT;  Surgeon: [name], MD;  Location: [place] CV LAB;  Service: Cardiovascular;  Laterality: N/A;  . TEE WITHOUT CARDIOVERSION  06/30/2011   Procedure: TRANSESOPHAGEAL ECHOCARDIOGRAM (TEE);  Surgeon: [name], MD;  Location: [facility] ENDOSCOPY;  Service: Cardiovascular;  Laterality: N/A;  . TONSILLECTOMY    . TUBAL LIGATION      Current Outpatient Medications  Medication Sig Dispense Refill  . acetaminophen (TYLENOL) 500 MG tablet Take 500-1,000 mg by mouth every 6 (six) hours as needed for headache.    [name] amiodarone (PACERONE) 200 MG tablet Take 1 tablet (200 mg total) by mouth daily. 90 tablet 1  . amLODipine (NORVASC) 10 MG tablet Take 1 tablet (10 mg total) by mouth daily. 90 tablet 2  . atorvastatin (LIPITOR) 80 MG tablet Take 1 tablet (80 mg total) by mouth daily. 90 tablet 2  .  diphenhydrAMINE (BENADRYL) 25 MG tablet Take 25 mg by mouth every 6 (six) hours as needed for allergies.    . famotidine (PEPCID) 20 MG tablet Take 20 mg by mouth daily as needed for indigestion.     . furosemide (LASIX) 20 MG tablet TAKE 1 TABLET BY MOUTH AS NEEDED FOR  SWELLING. 90 tablet 2  . losartan (COZAAR) 50 MG tablet Take 1.5 tablets (75 mg total) by mouth daily. 135 tablet 2  . Saline 0.2 % SOLN Place 1 spray into both nostrils as needed (for congestion).     . vitamin B-12 (CYANOCOBALAMIN) 1000 MCG tablet Take 1 tablet (1,000 mcg total) by mouth daily.    . vitamin C (ASCORBIC ACID) 500 MG tablet Take 1,000 mg by mouth 2 (two) times daily.     [name] warfarin (COUMADIN) 5 MG tablet Take 1/2 a tablet to 1 tablet by mouth daily as directed by Coumadin Clinic 30 tablet 2   Current Facility-Administered Medications  Medication Dose Route Frequency Provider Last Rate Last Admin  . incobotulinumtoxinA (XEOMIN) 50 units injection 50 Units  50 Units Intramuscular Q90 days Marcial Pacas, MD   50 Units at 11/30/16 1546  . incobotulinumtoxinA (XEOMIN) 50 units injection 50 Units  50 Units Intramuscular Q90 days Marcial Pacas, MD   50 Units at 07/25/17 [phone]  . incobotulinumtoxinA (XEOMIN) 50 units injection 50 Units  50 Units Intramuscular Q90 days Marcial Pacas, MD   50 Units at 07/26/17 1217    Allergies:   Azithromycin, Latex, Spinach, and Tape   Social History: Social History   Socioeconomic History  . Marital status: Married    Spouse name: Not on file  . Number of children: 35  . Years of education: 2  . Highest education level: Not on file  Occupational History  . 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  Tobacco Use  . Smoking status: Former Smoker    Packs/day: 1.00    Years: 30.00    Pack years: 30.00    Quit date: 05/07/2011    Years since quitting: 9.0  . Smokeless tobacco: Never Used  Vaping Use  . Vaping Use: Never used  Substance and Sexual  Activity  . Alcohol use: Yes    Alcohol/week: 1.0 standard drink    Types: 1 Glasses of wine per week    Comment: 1 glass of red wine nightly  . Drug use: No  . Sexual activity: Yes  Other Topics Concern  . Not on file  Social History Narrative   Lives at home with husband and son.   Right-handed.   2 cups caffeine daily.   Social Determinants of Health   Financial Resource Strain:   . Difficulty of Paying Living Expenses: Not on file  Food Insecurity:   . Worried About Estate manager/land agent  of Food in the Last Year: Not on file  . Ran Out of Food in the Last Year: Not on file  Transportation Needs:   . Lack of Transportation (Medical): Not on file  . Lack of Transportation (Non-Medical): Not on file  Physical Activity:   . Days of Exercise per Week: Not on file  . Minutes of Exercise per Session: Not on file  Stress:   . Feeling of Stress : Not on file  Social Connections:   . Frequency of Communication with Friends and Family: Not on file  . Frequency of Social Gatherings with Friends and Family: Not on file  . Attends Religious Services: Not on file  . Active Member of Clubs or Organizations: Not on file  . Attends Archivist Meetings: Not on file  . Marital Status: Not on file  Intimate Partner Violence:   . Fear of Current or Ex-Partner: Not on file  . Emotionally Abused: Not on file  . Physically Abused: Not on file  . Sexually Abused: Not on file    Family History: Family History  Problem Relation Age of Onset  . Hypertension Mother   . Stroke Mother   . Heart disease Father   . COPD Father   . Anesthesia problems Neg Hx   . Hypotension Neg Hx   . Malignant hyperthermia Neg Hx   . Pseudochol deficiency Neg Hx      Review of Systems: All other systems reviewed and are otherwise negative except as noted above.   Physical Exam: VS:  BP 140/60   Pulse 60   Ht 5\' 8"  (1.727 m)   Wt 204 lb (92.5 kg)   LMP 05/18/2015 Comment: went for 3 week after  skipping two months   SpO2 99%   BMI 31.02 kg/m  , BMI Body mass index is 31.02 kg/m.  GEN- The patient is well appearing, alert and oriented x 3 today.   HEENT: normocephalic, atraumatic; sclera clear, conjunctiva pink; hearing intact; oropharynx clear; neck supple  Lungs- Clear to ausculation bilaterally, normal work of breathing.  No wheezes, rales, rhonchi Heart- Regular rate and rhythm   GI- soft, non-tender, non-distended, bowel sounds present  Extremities- no clubbing, cyanosis, or edema  MS- no significant deformity or atrophy Skin- warm and dry, no rash or lesion; PPM pocket well healed Psych- euthymic mood, full affect Neuro- strength and sensation are intact  PPM Interrogation- reviewed in detail today,  See PACEART report  EKG:  EKG is not ordered today.   Recent Labs: 06/25/2019: Magnesium 2.3 10/09/2019: BUN 12; Creatinine, Ser 1.01; Potassium 4.7; Sodium 140 12/03/2019: ALT 25; Hemoglobin 13.9; Platelets 286; TSH 1.430   Wt Readings from Last 3 Encounters:  06/04/20 204 lb (92.5 kg)  03/16/20 (!) 208 lb (94.3 kg)  12/03/19 208 lb (94.3 kg)     Other studies Reviewed: Additional studies/ records that were reviewed today include: Dr Olin Pia notes  Assessment and Plan:  1.  Symptomatic bradycardia Normal PPM function See Pace Art report No changes today  2.  Paroxysmal atrial fibrillation Burden by device interrogation 11% Continue OAC and Amiodarone No bleeding issues Amio surveillance labs today    Current medicines are reviewed at length with the patient today.   The patient does not have concerns regarding her medicines.  The following changes were made today:  none  Labs/ tests ordered today include: TSH, LFT No orders of the defined types were placed in this encounter.    Disposition:  Follow up with Delilah Shan, Dr Caryl Comes 6 months   Signed, Chanetta Marshall, NP 06/04/2020 10:21 AM  [place] [id number] [phone] (office) [phone] (fax)

## 2020-06-04 ENCOUNTER — Ambulatory Visit (INDEPENDENT_AMBULATORY_CARE_PROVIDER_SITE_OTHER): Payer: Self-pay | Admitting: Nurse Practitioner

## 2020-06-04 ENCOUNTER — Encounter: Payer: Self-pay | Admitting: Nurse Practitioner

## 2020-06-04 ENCOUNTER — Other Ambulatory Visit: Payer: Self-pay

## 2020-06-04 ENCOUNTER — Ambulatory Visit (INDEPENDENT_AMBULATORY_CARE_PROVIDER_SITE_OTHER): Payer: Self-pay | Admitting: Pharmacist

## 2020-06-04 VITALS — BP 140/60 | HR 60 | Ht 68.0 in | Wt 204.0 lb

## 2020-06-04 DIAGNOSIS — Z5181 Encounter for therapeutic drug level monitoring: Secondary | ICD-10-CM

## 2020-06-04 DIAGNOSIS — I4891 Unspecified atrial fibrillation: Secondary | ICD-10-CM

## 2020-06-04 DIAGNOSIS — I4819 Other persistent atrial fibrillation: Secondary | ICD-10-CM

## 2020-06-04 DIAGNOSIS — Z8673 Personal history of transient ischemic attack (TIA), and cerebral infarction without residual deficits: Secondary | ICD-10-CM

## 2020-06-04 DIAGNOSIS — I495 Sick sinus syndrome: Secondary | ICD-10-CM

## 2020-06-04 LAB — CUP PACEART INCLINIC DEVICE CHECK
Date Time Interrogation Session: 20211014102149
Implantable Lead Implant Date: 20201103
Implantable Lead Implant Date: 20201103
Implantable Lead Location: 753859
Implantable Lead Location: 753860
Implantable Pulse Generator Implant Date: 20201103
Pulse Gen Model: 2272
Pulse Gen Serial Number: 9174760

## 2020-06-04 LAB — HEPATIC FUNCTION PANEL
ALT: 27 IU/L (ref 0–32)
AST: 19 IU/L (ref 0–40)
Albumin: 4.7 g/dL (ref 3.8–4.9)
Alkaline Phosphatase: 104 IU/L (ref 44–121)
Bilirubin Total: 0.5 mg/dL (ref 0.0–1.2)
Bilirubin, Direct: 0.19 mg/dL (ref 0.00–0.40)
Total Protein: 6.9 g/dL (ref 6.0–8.5)

## 2020-06-04 LAB — TSH: TSH: 1.75 u[IU]/mL (ref 0.450–4.500)

## 2020-06-04 LAB — POCT INR: INR: 2.1 (ref 2.0–3.0)

## 2020-06-04 NOTE — Patient Instructions (Addendum)
Medication Instructions:  *If you need a refill on your cardiac medications before your next appointment, please call your pharmacy*  Lab Work: Your physician has recommended that you have lab work today: TSH and Hepatic Function Panel If you have labs (blood work) drawn today and your tests are completely normal, you will receive your results only by:  MyChart Message (if you have MyChart) OR  A paper copy in the mail If you have any lab test that is abnormal or we need to change your treatment, we will call you to review the results.  Follow-Up: At [facility], you and your health needs are our priority.  As part of our continuing mission to provide you with exceptional heart care, we have created designated Provider Care Teams.  These Care Teams include your primary Cardiologist (physician) and Advanced Practice Providers (APPs -  Physician Assistants and Nurse Practitioners) who all work together to provide you with the care you need, when you need it.  We recommend signing up for the patient portal called "MyChart".  Sign up information is provided on this After Visit Summary.  MyChart is used to connect with patients for Virtual Visits (Telemedicine).  Patients are able to view lab/test results, encounter notes, upcoming appointments, etc.  Non-urgent messages can be sent to your provider as well.   To learn more about what you can do with MyChart, go to [email or web address].    Your next apYour physician wants you to follow-up in: 6 MONTHS with Dr. Carson Myrtle:    You will receive a reminder letter in the mail two months in advance. If you don't receive a letter, please call our office to schedule the follow-up appointment.  Remote monitoring is used to monitor your Pacemaker from home. This monitoring reduces the number of office visits required to check your device to one time per year. It allows [place] to keep an eye on the functioning of your device to ensure it is working  properly. You are scheduled for a device check from home on 06/23/20. You may send your transmission at any time that day. If you have a wireless device, the transmission will be sent automatically. After your physician reviews your transmission, you will receive a postcard with your next transmission date.  The format for your next appointment:   In Person with Virl Axe, MD

## 2020-06-04 NOTE — Patient Instructions (Signed)
Description   Continue taking 1 tablet daily except 1/2 tablet on Tuesdays, Thursdays, and Saturdays.  Call Coumadin Clinic [phone] if you have any changes in your medication or upcoming procedures.  Recheck INR in 5 weeks.

## 2020-06-05 ENCOUNTER — Telehealth: Payer: Self-pay

## 2020-06-05 NOTE — Telephone Encounter (Signed)
-----   Message from Ashley Berthold, NP sent at 06/05/2020  8:21 AM EDT ----- Please notify patient of stable labs. Thanks!

## 2020-06-05 NOTE — Telephone Encounter (Signed)
Pt is aware and agreeable to stable labs.  

## 2020-06-23 ENCOUNTER — Ambulatory Visit (INDEPENDENT_AMBULATORY_CARE_PROVIDER_SITE_OTHER): Payer: Self-pay

## 2020-06-23 DIAGNOSIS — I495 Sick sinus syndrome: Secondary | ICD-10-CM

## 2020-06-23 LAB — CUP PACEART REMOTE DEVICE CHECK
Battery Remaining Longevity: 117 mo
Battery Remaining Percentage: 95.5 %
Battery Voltage: 3.02 V
Brady Statistic AP VP Percent: 1 %
Brady Statistic AP VS Percent: 79 %
Brady Statistic AS VP Percent: 1 %
Brady Statistic AS VS Percent: 19 %
Brady Statistic RA Percent Paced: 79 %
Brady Statistic RV Percent Paced: 1 %
Date Time Interrogation Session: 20211102020014
Implantable Lead Implant Date: 20201103
Implantable Lead Implant Date: 20201103
Implantable Lead Location: 753859
Implantable Lead Location: 753860
Implantable Pulse Generator Implant Date: 20201103
Lead Channel Impedance Value: 440 Ohm
Lead Channel Impedance Value: 600 Ohm
Lead Channel Pacing Threshold Amplitude: 0.625 V
Lead Channel Pacing Threshold Amplitude: 1 V
Lead Channel Pacing Threshold Pulse Width: 0.5 ms
Lead Channel Pacing Threshold Pulse Width: 0.5 ms
Lead Channel Sensing Intrinsic Amplitude: 12 mV
Lead Channel Sensing Intrinsic Amplitude: 5 mV
Lead Channel Setting Pacing Amplitude: 0.875
Lead Channel Setting Pacing Amplitude: 2 V
Lead Channel Setting Pacing Pulse Width: 0.5 ms
Lead Channel Setting Sensing Sensitivity: 2 mV
Pulse Gen Model: 2272
Pulse Gen Serial Number: 9174760

## 2020-06-25 NOTE — Progress Notes (Signed)
Remote pacemaker transmission.   

## 2020-07-09 ENCOUNTER — Other Ambulatory Visit: Payer: Self-pay

## 2020-07-09 ENCOUNTER — Ambulatory Visit (INDEPENDENT_AMBULATORY_CARE_PROVIDER_SITE_OTHER): Payer: Self-pay | Admitting: *Deleted

## 2020-07-09 DIAGNOSIS — I4891 Unspecified atrial fibrillation: Secondary | ICD-10-CM

## 2020-07-09 DIAGNOSIS — Z8673 Personal history of transient ischemic attack (TIA), and cerebral infarction without residual deficits: Secondary | ICD-10-CM

## 2020-07-09 DIAGNOSIS — Z5181 Encounter for therapeutic drug level monitoring: Secondary | ICD-10-CM

## 2020-07-09 LAB — POCT INR: INR: 1.7 — AB (ref 2.0–3.0)

## 2020-07-09 NOTE — Patient Instructions (Addendum)
Description   Today take 1 tablet then continue taking 1 tablet daily except 1/2 tablet on Tuesdays, Thursdays, and Saturdays. Call Coumadin Clinic [phone] if you have any changes in your medication or upcoming procedures.  Recheck INR in 3 weeks.

## 2020-07-30 ENCOUNTER — Other Ambulatory Visit: Payer: Self-pay

## 2020-07-30 ENCOUNTER — Ambulatory Visit (INDEPENDENT_AMBULATORY_CARE_PROVIDER_SITE_OTHER): Payer: Self-pay | Admitting: Pharmacist

## 2020-07-30 DIAGNOSIS — Z5181 Encounter for therapeutic drug level monitoring: Secondary | ICD-10-CM

## 2020-07-30 DIAGNOSIS — I4891 Unspecified atrial fibrillation: Secondary | ICD-10-CM

## 2020-07-30 DIAGNOSIS — Z8673 Personal history of transient ischemic attack (TIA), and cerebral infarction without residual deficits: Secondary | ICD-10-CM

## 2020-07-30 LAB — POCT INR: INR: 1.9 — AB (ref 2.0–3.0)

## 2020-07-30 NOTE — Patient Instructions (Signed)
Today take 1 tablet then start taking 1 tablet daily except 1/2 tablet on Tuesdays and Saturdays. Call Coumadin Clinic [phone] if you have any changes in your medication or upcoming procedures.  Recheck INR in 2 weeks.

## 2020-08-13 ENCOUNTER — Other Ambulatory Visit: Payer: Self-pay

## 2020-08-13 ENCOUNTER — Ambulatory Visit (INDEPENDENT_AMBULATORY_CARE_PROVIDER_SITE_OTHER): Payer: Self-pay | Admitting: *Deleted

## 2020-08-13 DIAGNOSIS — Z8673 Personal history of transient ischemic attack (TIA), and cerebral infarction without residual deficits: Secondary | ICD-10-CM

## 2020-08-13 DIAGNOSIS — I4891 Unspecified atrial fibrillation: Secondary | ICD-10-CM

## 2020-08-13 DIAGNOSIS — Z5181 Encounter for therapeutic drug level monitoring: Secondary | ICD-10-CM

## 2020-08-13 LAB — POCT INR: INR: 3.3 — AB (ref 2.0–3.0)

## 2020-08-13 NOTE — Patient Instructions (Addendum)
  Description   Do not take any Warfarin then continue taking 1 tablet daily except 1/2 tablet on Tuesdays and Saturdays. Call Coumadin Clinic [phone] if you have any changes in your medication or upcoming procedures.  Recheck INR in 2 weeks.

## 2020-08-18 ENCOUNTER — Other Ambulatory Visit: Payer: Self-pay | Admitting: Internal Medicine

## 2020-08-27 ENCOUNTER — Ambulatory Visit (INDEPENDENT_AMBULATORY_CARE_PROVIDER_SITE_OTHER): Payer: Self-pay | Admitting: *Deleted

## 2020-08-27 ENCOUNTER — Other Ambulatory Visit: Payer: Self-pay

## 2020-08-27 DIAGNOSIS — I4891 Unspecified atrial fibrillation: Secondary | ICD-10-CM

## 2020-08-27 DIAGNOSIS — Z8673 Personal history of transient ischemic attack (TIA), and cerebral infarction without residual deficits: Secondary | ICD-10-CM

## 2020-08-27 LAB — POCT INR: INR: 2 (ref 2.0–3.0)

## 2020-08-27 NOTE — Patient Instructions (Signed)
Description   Continue taking 1 tablet daily except 1/2 tablet on Tuesdays and Saturdays. Call Coumadin Clinic [phone] if you have any changes in your medication or upcoming procedures.  Recheck INR in 3 weeks.

## 2020-09-17 ENCOUNTER — Ambulatory Visit (INDEPENDENT_AMBULATORY_CARE_PROVIDER_SITE_OTHER): Payer: Self-pay | Admitting: *Deleted

## 2020-09-17 ENCOUNTER — Other Ambulatory Visit: Payer: Self-pay

## 2020-09-17 DIAGNOSIS — I4891 Unspecified atrial fibrillation: Secondary | ICD-10-CM

## 2020-09-17 DIAGNOSIS — Z5181 Encounter for therapeutic drug level monitoring: Secondary | ICD-10-CM

## 2020-09-17 DIAGNOSIS — Z8673 Personal history of transient ischemic attack (TIA), and cerebral infarction without residual deficits: Secondary | ICD-10-CM

## 2020-09-17 LAB — POCT INR: INR: 1.6 — AB (ref 2.0–3.0)

## 2020-09-17 NOTE — Patient Instructions (Signed)
Description   Today take 1.5 tablets then continue taking 1 tablet daily except 1/2 tablet on Tuesdays and Saturdays. Call Coumadin Clinic [phone] if you have any changes in your medication or upcoming procedures.  Recheck INR in 3 weeks.

## 2020-09-21 ENCOUNTER — Telehealth: Payer: Self-pay | Admitting: Internal Medicine

## 2020-09-21 NOTE — Telephone Encounter (Signed)
New message:    Patient has a question concering her daughter having some of the same issues she is having. Please call patient.

## 2020-09-22 ENCOUNTER — Ambulatory Visit (INDEPENDENT_AMBULATORY_CARE_PROVIDER_SITE_OTHER): Payer: Self-pay

## 2020-09-22 DIAGNOSIS — I495 Sick sinus syndrome: Secondary | ICD-10-CM

## 2020-09-22 LAB — CUP PACEART REMOTE DEVICE CHECK
Battery Remaining Longevity: 125 mo
Battery Remaining Percentage: 95.5 %
Battery Voltage: 3.02 V
Brady Statistic AP VP Percent: 1.3 %
Brady Statistic AP VS Percent: 69 %
Brady Statistic AS VP Percent: 1 %
Brady Statistic AS VS Percent: 29 %
Brady Statistic RA Percent Paced: 68 %
Brady Statistic RV Percent Paced: 1.5 %
Date Time Interrogation Session: 20220201020013
Implantable Lead Implant Date: 20201103
Implantable Lead Implant Date: 20201103
Implantable Lead Location: 753859
Implantable Lead Location: 753860
Implantable Pulse Generator Implant Date: 20201103
Lead Channel Impedance Value: 440 Ohm
Lead Channel Impedance Value: 600 Ohm
Lead Channel Pacing Threshold Amplitude: 0.625 V
Lead Channel Pacing Threshold Amplitude: 0.875 V
Lead Channel Pacing Threshold Pulse Width: 0.5 ms
Lead Channel Pacing Threshold Pulse Width: 0.5 ms
Lead Channel Sensing Intrinsic Amplitude: 12 mV
Lead Channel Sensing Intrinsic Amplitude: 5 mV
Lead Channel Setting Pacing Amplitude: 0.875
Lead Channel Setting Pacing Amplitude: 1.875
Lead Channel Setting Pacing Pulse Width: 0.5 ms
Lead Channel Setting Sensing Sensitivity: 2 mV
Pulse Gen Model: 2272
Pulse Gen Serial Number: 9174760

## 2020-09-22 NOTE — Telephone Encounter (Signed)
Attempted phone call to pt.  Left voicemail message to contact RN at [phone]. 

## 2020-09-22 NOTE — Telephone Encounter (Signed)
Pt states her adult daughter was recently diagnosed with and recovered from Covid but continues to have elevated BP and heart rate.  Denies additional symptoms other than fatigue.  Pt would like for daughter to be evaluated by Dr Caryl Comes.  Pt advised daughter should see her PCP if she has not already.  Pt states daughter does not have a PCP.  Provided PCP hotline number and encouraged to have her daughter call. Pt states she will also talk to her PCP office.  Advised pt should start with PCP evaluation and if needed referral can be made for Dr Caryl Comes.  Pt verbalized understanding and states she will pass along information to her daughter.  Pt thanked Therapist, sports for call.

## 2020-09-22 NOTE — Telephone Encounter (Signed)
Follow Up:     Pt is returning Marsha's call from this morning.

## 2020-09-23 ENCOUNTER — Encounter: Payer: Self-pay | Admitting: Family Medicine

## 2020-09-23 ENCOUNTER — Other Ambulatory Visit: Payer: Self-pay

## 2020-09-23 ENCOUNTER — Ambulatory Visit: Payer: Self-pay | Attending: Family Medicine | Admitting: Family Medicine

## 2020-09-23 VITALS — BP 121/70 | HR 87 | Ht 68.0 in | Wt 204.0 lb

## 2020-09-23 DIAGNOSIS — Z1231 Encounter for screening mammogram for malignant neoplasm of breast: Secondary | ICD-10-CM

## 2020-09-23 DIAGNOSIS — I495 Sick sinus syndrome: Secondary | ICD-10-CM

## 2020-09-23 DIAGNOSIS — I2510 Atherosclerotic heart disease of native coronary artery without angina pectoris: Secondary | ICD-10-CM

## 2020-09-23 DIAGNOSIS — I4819 Other persistent atrial fibrillation: Secondary | ICD-10-CM

## 2020-09-23 DIAGNOSIS — I10 Essential (primary) hypertension: Secondary | ICD-10-CM

## 2020-09-23 NOTE — Progress Notes (Signed)
Subjective:  Patient ID: Walthill, female    DOB: 1965-09-05  Age: 55 y.o. MRN: [id number]  CC: Hypertension   HPI Ashley [name] is a 56year old female with medical historysignificant for hypertension,previous CVA,CAD status post CABG, MVP s/pmitral valve repair, atrial fibrillation failedTikosyn treatmentdue to prolonged QTC,DCCV in10/2020 with period of asystole, junctional rhythm in the 20s which was treated with atropine and ephedrineanddopamine for hypotension,sinus node dysfunction status post pacemaker placement in 06/2019 here for follow-up visit.  Last seen by cardiology in 05/2020, next appointment is in 12/2020.  Last device check was on 09/22/2020 which revealed AF burden < 1%, longest episode approx 3 hrs. Overall, rates controlled with some RVR (flutter).  INR is being managed by the cardiology Coumadin clinic and last INR was 1.6 on 09/17/2020. Denies presence of chest pain, dyspnea, pedal edema. Ashley [name] has no concerns today Past Medical History:  Diagnosis Date  . Abdominal pain    ? chronic cholecystitis; [facility] (CT, USN, HIDA done)  . Atrial fibrillation ([place])   . Cardiomyopathy    echo 9/12: mild LVH, EF 35-40%, mod MR (difficult to judge /post leaflet restricted), mod BAE, mod RVE, mod TR   . Cardiomyopathy ([place])    a. echo 9/12: mild LVH, EF 35-40%, mod MR (difficult to judge /post leaflet restricted), mod BAE, mod RVE, mod TR. b. Echo 11/2011: mild LVH, EF 55-60%, s/p MV repair w/o sig MR/MS, mildly dilated RV/RA, PASP 77mmHg. // AFL dx 10/2018 - Echo 10/2018: EF 30, diff HK, s/p MV repair with trivial MR, mean MV 7 but not c/w significant mitral stenosis, mild RAE, severe LAE, mod reduced RVSF   . Carotid artery disease ([place])    a. Duplex 06/2015: 50% RECA, 1-39% BICA.  [name] Coronary artery disease    a. s/p CABG 10/2011 - LIMA-LAD, SVG-PDA, SVG-OM, SVG-diagonal.  . Dyslipidemia   . Essential hypertension   . Hemifacial spasm   .  History of Doppler ultrasound    a. Carotid US [facility] in [place], [name]) 7/17: no hemodynamically significant ICA stenosis  . Mitral regurgitation    a. s/p MV repair 10/2011 - on Coumadin for 3 months afterwards then d/c'd by surgery.  . Recurrent upper respiratory infection (URI)   . 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 Stroke [facility])    a. age 18 - in setting of cigs and OCPs. //  b. s/p acute L parietal CVA >> tPA - [facility] in [place], [name]  . Tobacco abuse     Past Surgical History:  Procedure Laterality Date  . CARDIAC CATHETERIZATION    . CARDIOVERSION N/A 01/25/2019   Procedure: CARDIOVERSION;  Surgeon: [name], MD;  Location: [facility] ENDOSCOPY;  Service: Cardiovascular;  Laterality: N/A;  . CARDIOVERSION N/A 06/14/2019   Procedure: CARDIOVERSION;  Surgeon: [name], MD;  Location: [place];  Service: Cardiovascular;  Laterality: N/A;  . CESAREAN SECTION    . cesarean section    . CORONARY ARTERY BYPASS GRAFT  10/21/2011   Procedure: CORONARY ARTERY BYPASS GRAFTING (CABG);  Surgeon: [name], MD;  Location: [place];  Service: Open Heart Surgery;  Laterality: N/A;  cabg x four, using right leg greater saphenous vein harvested endoscopically  . DENTAL SURGERY    . MITRAL VALVE REPAIR  10/21/2011   Procedure: MITRAL VALVE REPAIR (MVR);  Surgeon: [name], MD;  Location[facility]  OR;  Service: Open Heart Surgery;  Laterality: N/A;  . PACEMAKER IMPLANT N/A 06/25/2019   Procedure: PACEMAKER IMPLANT;  Surgeon: [name], MD;  Location: [place] CV LAB;  Service: Cardiovascular;  Laterality: N/A;  . TEE WITHOUT CARDIOVERSION  06/30/2011   Procedure: TRANSESOPHAGEAL ECHOCARDIOGRAM (TEE);  Surgeon: [name], MD;  Location: [facility] ENDOSCOPY;  Service: Cardiovascular;  Laterality: N/A;  . TONSILLECTOMY    . TUBAL LIGATION      Family History  Problem Relation Age of Onset  . Hypertension Mother   . Stroke Mother   .  Heart disease Father   . COPD Father   . Anesthesia problems Neg Hx   . Hypotension Neg Hx   . Malignant hyperthermia Neg Hx   . Pseudochol deficiency Neg Hx     Allergies  Allergen Reactions  . Azithromycin Nausea Only  . Latex Rash  . Spinach Hives  . Tape Other (See Comments)    PLEASE USE PAPER TAPE!! AFFECTS SKIN BADLY!!!!    Outpatient Medications Prior to Visit  Medication Sig Dispense Refill  . acetaminophen (TYLENOL) 500 MG tablet Take 500-1,000 mg by mouth every 6 (six) hours as needed for headache.    [name] amiodarone (PACERONE) 200 MG tablet Take 1 tablet (200 mg total) by mouth daily. 90 tablet 1  . amLODipine (NORVASC) 10 MG tablet Take 1 tablet (10 mg total) by mouth daily. 90 tablet 2  . atorvastatin (LIPITOR) 80 MG tablet Take 1 tablet (80 mg total) by mouth daily. 90 tablet 2  . diphenhydrAMINE (BENADRYL) 25 MG tablet Take 25 mg by mouth every 6 (six) hours as needed for allergies.    . famotidine (PEPCID) 20 MG tablet Take 20 mg by mouth daily as needed for indigestion.     . furosemide (LASIX) 20 MG tablet TAKE 1 TABLET BY MOUTH AS NEEDED FOR  SWELLING. 90 tablet 2  . losartan (COZAAR) 50 MG tablet Take 1.5 tablets (75 mg total) by mouth daily. 135 tablet 2  . Saline 0.2 % SOLN Place 1 spray into both nostrils as needed (for congestion).     . vitamin B-12 (CYANOCOBALAMIN) 1000 MCG tablet Take 1 tablet (1,000 mcg total) by mouth daily.    . vitamin C (ASCORBIC ACID) 500 MG tablet Take 1,000 mg by mouth 2 (two) times daily.     [name] warfarin (JANTOVEN) 5 MG tablet TAKE ONE-HALF TO ONE TABLET BY MOUTH ONE TIME DAILY AS DIRECTED BY COUMADIN CLINIC 30 tablet 2   Facility-Administered Medications Prior to Visit  Medication Dose Route Frequency Provider Last Rate Last Admin  . incobotulinumtoxinA (XEOMIN) 50 units injection 50 Units  50 Units Intramuscular Q90 days Marcial Pacas, MD   50 Units at 11/30/16 1546  . incobotulinumtoxinA (XEOMIN) 50 units injection 50 Units  50  Units Intramuscular Q90 days Marcial Pacas, MD   50 Units at 07/25/17 [id number]  . incobotulinumtoxinA (XEOMIN) 50 units injection 50 Units  50 Units Intramuscular Q90 days Marcial Pacas, MD   50 Units at 07/26/17 1217     ROS Review of Systems  Constitutional: Negative for activity change, appetite change and fatigue.  HENT: Negative for congestion, sinus pressure and sore throat.   Eyes: Negative for visual disturbance.  Respiratory: Negative for cough, chest tightness, shortness of breath and wheezing.   Cardiovascular: Negative for chest pain and palpitations.  Gastrointestinal: Negative for abdominal distention, abdominal pain and constipation.  Endocrine: Negative for polydipsia.  Genitourinary: Negative for dysuria and  frequency.  Musculoskeletal: Negative for arthralgias and back pain.  Skin: Negative for rash.  Neurological: Negative for tremors, light-headedness and numbness.  Hematological: Does not bruise/bleed easily.  Psychiatric/Behavioral: Negative for agitation and behavioral problems.    Objective:  BP 121/70   Pulse 87   Ht 5\' 8"  (1.727 m)   Wt 204 lb (92.5 kg)   LMP 05/18/2015 Comment: went for 3 week after skipping two months   SpO2 100%   BMI 31.02 kg/m   BP/Weight 09/23/2020 06/04/2020 [id number]  Systolic BP [id number]  Diastolic BP 70 60 60  Wt. (Lbs) 204 204 208  BMI 31.02 31.02 31.63      Physical Exam Constitutional:      Appearance: Ashley [name] is well-developed.  Neck:     Vascular: No JVD.  Cardiovascular:     Rate and Rhythm: Normal rate.     Heart sounds: Normal heart sounds. No murmur heard.   Pulmonary:     Effort: Pulmonary effort is normal.     Breath sounds: Normal breath sounds. No wheezing or rales.  Chest:     Chest wall: No tenderness.  Abdominal:     General: Bowel sounds are normal. There is no distension.     Palpations: Abdomen is soft. There is no mass.     Tenderness: There is no abdominal tenderness.  Musculoskeletal:         General: Normal range of motion.     Right lower leg: No edema.     Left lower leg: No edema.  Neurological:     Mental Status: Ashley [name] is alert and oriented to person, place, and time.  Psychiatric:        Mood and Affect: Mood normal.     CMP Latest Ref Rng & Units 06/04/2020 12/03/2019 10/09/2019  Glucose 65 - 99 mg/dL - - 102(H)  BUN 6 - 24 mg/dL - - 12  Creatinine 0.57 - 1.00 mg/dL - - 1.01(H)  Sodium 134 - 144 mmol/L - - 140  Potassium 3.5 - 5.2 mmol/L - - 4.7  Chloride 96 - 106 mmol/L - - 103  CO2 20 - 29 mmol/L - - 21  Calcium 8.7 - 10.2 mg/dL - - 9.8  Total Protein 6.0 - 8.5 g/dL 6.9 7.8 -  Total Bilirubin 0.0 - 1.2 mg/dL 0.5 1.4(H) -  Alkaline Phos 44 - 121 IU/L 104 137(H) -  AST 0 - 40 IU/L 19 27 -  ALT 0 - 32 IU/L 27 25 -    Lipid Panel     Component Value Date/Time   CHOL 114 11/16/2017 0838   TRIG 68 11/16/2017 0838   HDL 43 11/16/2017 0838   CHOLHDL 2.7 11/16/2017 0838   CHOLHDL 3.8 07/01/2016 0939   VLDL 25 07/01/2016 0939   LDLCALC 57 11/16/2017 0838    CBC    Component Value Date/Time   WBC 6.8 12/03/2019 1431   WBC 6.3 06/25/2019 1239   RBC 4.10 12/03/2019 1431   RBC 4.28 06/25/2019 1239   HGB 13.9 12/03/2019 1431   HCT 40.5 12/03/2019 1431   PLT 286 12/03/2019 1431   MCV 99 (H) 12/03/2019 1431   MCH 33.9 (H) 12/03/2019 1431   MCH 32.5 06/25/2019 1239   MCHC 34.3 12/03/2019 1431   MCHC 32.5 06/25/2019 1239   RDW 12.9 12/03/2019 1431   LYMPHSABS 1.0 06/25/2019 1239   MONOABS 0.7 06/25/2019 1239   EOSABS 0.2 06/25/2019 1239   BASOSABS 0.1 06/25/2019 1239  Lab Results  Component Value Date   HGBA1C 5.1 03/16/2020    Assessment & Plan:  1. Other persistent atrial fibrillation (HCC) Stable Continue amiodarone On anticoagulation with warfarin managed by the Coumadin clinic  2. Essential hypertension Controlled Counseled on blood pressure goal of less than 130/80, low-sodium, DASH diet, medication compliance, 150 minutes of moderate  intensity exercise per week. Discussed medication compliance, adverse effects.  3. Coronary artery disease involving native coronary artery of native heart without angina pectoris Asymptomatic Risk factor modification Continue statin  4. Sinus node dysfunction (HCC) Status post pacemaker placement Follow-up with EP  5. Encounter for screening mammogram for malignant neoplasm of breast - MM 3D SCREEN BREAST BILATERAL; Future    No orders of the defined types were placed in this encounter.   Follow-up: Return in about 6 months (around 03/23/2021) for Chronic disease management.       [name], MD, FAAFP. [facility] [place], [place]   09/23/2020, 1:14 PM

## 2020-09-23 NOTE — Patient Instructions (Signed)
Physical Activity With Heart Disease Being active has many benefits, especially if you have heart disease. Physical activity can help you do more and feel healthier. Start slowly, and increase the amount of time you spend being active. Most adults should aim for physical activity that:  Makes you breathe harder and raises your heart rate (aerobic activity). Try to get at least 150 minutes of aerobic activity each week. This is about 30 minutes each day, 5 days a week.  Helps build muscle strength (strengthening activity). Do this at least 2 times a week. Always talk with your health care provider before starting any new activity program or if you have any changes in your condition. What are the benefits of physical activity? When you have heart disease, physical activity can help:  Lower your blood pressure.  Lower your cholesterol.  Control your weight.  Improve your sleep.  Help control your blood sugar.  Improve your heart and lung function.  Reduce your risk for blood clots (thrombophlebitis).  Improve your energy level.  Reduce stress. What are some types of physical activity I could try? There are many ways to be active. Talk with your health care provider about what types and intensity of activity is right for you. Aerobic activity Aerobic (cardiovascular) activity can be moderate or vigorous intensity, depending on how hard you are working. Moderate-intensity activity includes:  Walking.  Slow bicycling.  Water aerobics.  Dancing.  Light gardening or house work. Vigorous-intensity activity includes:  Jogging or running.  Stair climbing.  Swimming laps.  Hiking uphill.  Heavy gardening, such as digging trenches.   Strengthening activity Strengthening activities work your muscles to build strength. Some examples include:  Doing push-ups, sit-ups, or pull-ups.  Lifting small weights.  Using resistance bands.   Flexibility Flexibility activities  lengthen your muscles to keep them flexible and less tight and improve your balance. Some examples include:  Stretching.  Yoga.  Tai chi.  Ballet barre.   Follow these instructions at home: How to get started  Talk with your health care provider about: ? What types of activities are safe for you. ? If you should check your pulse or take other precautions during physical activity.  Get a calendar. Write down a schedule and plan for your new routine.  Take time to find out what works for you. Consider: ? Joining a community program, such as a biking group, yoga class, local gym, or swimming pool membership. ? Be active on your own by downloading free workout applications on a smartphone or other devices, or by purchasing workout DVDs.  If you have not been active, begin with sessions that last 10-15 minutes. Gradually work up to sessions that last 20-30 minutes, 5 times a week. Follow all of your health care provider's recommendations.  Be patient with yourself. It takes time to build up strength and lung capacity. Safety  Exercise in an indoor, climate-controlled facility, as told by your health care provider. You may need to do this if: ? There are extreme outdoor conditions, such as heat, humidity, or cold. ? There is an air pollution advisory. Your local news, board of health, or hospital can provide information on air quality.  Take extra precautions as told by your health care provider. This may include: ? Monitoring your heart rate. ? Avoiding heavy lifting. ? Understanding how your medicines can affect you during physical activity. Certain medicines may cause heat intolerance or changes in blood sugar. ? Slowing down to rest when you   need to. ? Keeping nitroglycerin spray and tablets with you at all times if you have angina. Use them as told to prevent and treat symptoms.  Drink plenty of water before, during, and after physical activity.  Know what symptoms may be signs  of a problem. Stop physical activity right away if you have any of these symptoms. Get help right away if you have any of the following during exercise:  Chest pain, shortness of breath, or feel very tired.  Pain in the arm, shoulder, neck, or jaw.  Feel weak, dizzy, or light-headed.  An irregular heart rate, or your heart rate is greater than 100 beats per minute (bpm) before exercise. These symptoms may represent a serious problem that is an emergency. Do not wait to see if the symptoms go away. Get medical help right away. Call your local emergency services (911 in the [place] Do not drive yourself to the hospital.  Summary  Physical activity has many benefits, especially if you have heart disease.  Before starting an activity program, talk with your health care provider about how often to be active and what type of activity is safe for you.  Your physical activity plan may include moderate or vigorous aerobic activity, strengthening activities, and flexibility.  Know what symptoms may be signs of a problem. Stop physical activity right away and call emergency services (911 in the [place] if you have any of these symptoms. This information is not intended to replace advice given to you by your health care provider. Make sure you discuss any questions you have with your health care provider. Document Revised: 08/30/2017 Document Reviewed: 08/30/2017 Elsevier Patient Education  2021 [employer].

## 2020-10-01 NOTE — Progress Notes (Signed)
Remote pacemaker transmission.   

## 2020-10-08 ENCOUNTER — Ambulatory Visit (INDEPENDENT_AMBULATORY_CARE_PROVIDER_SITE_OTHER): Payer: Self-pay | Admitting: *Deleted

## 2020-10-08 ENCOUNTER — Other Ambulatory Visit: Payer: Self-pay

## 2020-10-08 DIAGNOSIS — Z5181 Encounter for therapeutic drug level monitoring: Secondary | ICD-10-CM

## 2020-10-08 DIAGNOSIS — I4891 Unspecified atrial fibrillation: Secondary | ICD-10-CM

## 2020-10-08 DIAGNOSIS — Z8673 Personal history of transient ischemic attack (TIA), and cerebral infarction without residual deficits: Secondary | ICD-10-CM

## 2020-10-08 LAB — POCT INR: INR: 2.3 (ref 2.0–3.0)

## 2020-10-08 NOTE — Patient Instructions (Signed)
Description   Continue taking 1 tablet daily except 1/2 tablet on Tuesdays and Saturdays. Call Coumadin Clinic [phone] if you have any changes in your medication or upcoming procedures.  Recheck INR in 4 weeks.

## 2020-11-06 ENCOUNTER — Ambulatory Visit (INDEPENDENT_AMBULATORY_CARE_PROVIDER_SITE_OTHER): Payer: Self-pay | Admitting: *Deleted

## 2020-11-06 ENCOUNTER — Other Ambulatory Visit: Payer: Self-pay

## 2020-11-06 DIAGNOSIS — Z8673 Personal history of transient ischemic attack (TIA), and cerebral infarction without residual deficits: Secondary | ICD-10-CM

## 2020-11-06 DIAGNOSIS — Z5181 Encounter for therapeutic drug level monitoring: Secondary | ICD-10-CM

## 2020-11-06 DIAGNOSIS — I4891 Unspecified atrial fibrillation: Secondary | ICD-10-CM

## 2020-11-06 LAB — POCT INR: INR: 3.2 — AB (ref 2.0–3.0)

## 2020-11-06 NOTE — Patient Instructions (Addendum)
Description   Take warfarin 1/2 a tablet today and then continue to take 1 tablet daily except for 1/2 a tablet on Tuesdays and Saturdays. Recheck INR in 3 weeks. Coumadin Clinic [phone].

## 2020-11-23 ENCOUNTER — Other Ambulatory Visit: Payer: Self-pay | Admitting: Internal Medicine

## 2020-11-24 ENCOUNTER — Other Ambulatory Visit: Payer: Self-pay | Admitting: *Deleted

## 2020-11-24 MED ORDER — WARFARIN SODIUM 5 MG PO TABS: ORAL_TABLET | ORAL | 2 refills | Status: DC

## 2020-11-27 ENCOUNTER — Other Ambulatory Visit: Payer: Self-pay

## 2020-11-27 ENCOUNTER — Other Ambulatory Visit: Payer: Self-pay | Admitting: *Deleted

## 2020-11-27 ENCOUNTER — Ambulatory Visit (INDEPENDENT_AMBULATORY_CARE_PROVIDER_SITE_OTHER): Payer: Self-pay | Admitting: Pharmacist

## 2020-11-27 DIAGNOSIS — Z8673 Personal history of transient ischemic attack (TIA), and cerebral infarction without residual deficits: Secondary | ICD-10-CM

## 2020-11-27 DIAGNOSIS — Z5181 Encounter for therapeutic drug level monitoring: Secondary | ICD-10-CM

## 2020-11-27 DIAGNOSIS — I4891 Unspecified atrial fibrillation: Secondary | ICD-10-CM

## 2020-11-27 LAB — POCT INR: INR: 3.8 — AB (ref 2.0–3.0)

## 2020-11-27 MED ORDER — LOSARTAN POTASSIUM 50 MG PO TABS: 75.0000 mg | ORAL_TABLET | Freq: Every day | ORAL | 2 refills | Status: DC

## 2020-11-27 NOTE — Patient Instructions (Addendum)
Description   Hold dose today and then change schedule to 1/2 tablet Tuesday, Thursday, and Saturday and take 1 tablet the rest of the week. Recheck INR in 2 weeks. Coumadin Clinic [phone].

## 2020-12-08 ENCOUNTER — Other Ambulatory Visit: Payer: Self-pay

## 2020-12-08 ENCOUNTER — Ambulatory Visit
Admission: RE | Admit: 2020-12-08 | Discharge: 2020-12-08 | Disposition: A | Discharge status: Home / Self Care | Payer: No Typology Code available for payment source | Source: Ambulatory Visit | Attending: Family Medicine | Admitting: Family Medicine

## 2020-12-08 DIAGNOSIS — Z1231 Encounter for screening mammogram for malignant neoplasm of breast: Secondary | ICD-10-CM

## 2020-12-10 ENCOUNTER — Telehealth: Payer: Self-pay

## 2020-12-10 NOTE — Telephone Encounter (Signed)
-----   Message from [name], MD sent at 12/09/2020  4:59 PM EDT ----- Mammogram is negative for malignancy

## 2020-12-10 NOTE — Telephone Encounter (Signed)
Pt of informed of mammogram results verbalized understanding and voiced no other concerns.

## 2020-12-10 NOTE — Telephone Encounter (Signed)
Attempted to call pt no answer left VM to return call.

## 2020-12-11 ENCOUNTER — Ambulatory Visit (INDEPENDENT_AMBULATORY_CARE_PROVIDER_SITE_OTHER): Payer: Self-pay | Admitting: *Deleted

## 2020-12-11 ENCOUNTER — Other Ambulatory Visit: Payer: Self-pay

## 2020-12-11 DIAGNOSIS — Z8673 Personal history of transient ischemic attack (TIA), and cerebral infarction without residual deficits: Secondary | ICD-10-CM

## 2020-12-11 DIAGNOSIS — I4891 Unspecified atrial fibrillation: Secondary | ICD-10-CM

## 2020-12-11 DIAGNOSIS — Z5181 Encounter for therapeutic drug level monitoring: Secondary | ICD-10-CM

## 2020-12-11 LAB — POCT INR: INR: 2.3 (ref 2.0–3.0)

## 2020-12-11 MED ORDER — AMIODARONE HCL 200 MG PO TABS: 200.0000 mg | ORAL_TABLET | Freq: Every day | ORAL | 1 refills | Status: DC

## 2020-12-11 NOTE — Patient Instructions (Signed)
Description    Continue to take Warfarin 1 tablet daily except for 1/2 a tablet on Tuesday, Thursday and Saturday.  Recheck INR in 3 weeks. Coumadin Clinic [phone].

## 2020-12-22 ENCOUNTER — Ambulatory Visit (INDEPENDENT_AMBULATORY_CARE_PROVIDER_SITE_OTHER): Payer: Self-pay

## 2020-12-22 DIAGNOSIS — I495 Sick sinus syndrome: Secondary | ICD-10-CM

## 2020-12-22 LAB — CUP PACEART REMOTE DEVICE CHECK
Battery Remaining Longevity: 121 mo
Battery Remaining Percentage: 95.5 %
Battery Voltage: 3.02 V
Brady Statistic AP VP Percent: 1.1 %
Brady Statistic AP VS Percent: 62 %
Brady Statistic AS VP Percent: 1 %
Brady Statistic AS VS Percent: 35 %
Brady Statistic RA Percent Paced: 57 %
Brady Statistic RV Percent Paced: 2.2 %
Date Time Interrogation Session: 20220503020016
Implantable Lead Implant Date: 20201103
Implantable Lead Implant Date: 20201103
Implantable Lead Location: 753859
Implantable Lead Location: 753860
Implantable Pulse Generator Implant Date: 20201103
Lead Channel Impedance Value: 440 Ohm
Lead Channel Impedance Value: 580 Ohm
Lead Channel Pacing Threshold Amplitude: 0.75 V
Lead Channel Pacing Threshold Amplitude: 1.125 V
Lead Channel Pacing Threshold Pulse Width: 0.5 ms
Lead Channel Pacing Threshold Pulse Width: 0.5 ms
Lead Channel Sensing Intrinsic Amplitude: 12 mV
Lead Channel Sensing Intrinsic Amplitude: 5 mV
Lead Channel Setting Pacing Amplitude: 1 V
Lead Channel Setting Pacing Amplitude: 2.125
Lead Channel Setting Pacing Pulse Width: 0.5 ms
Lead Channel Setting Sensing Sensitivity: 2 mV
Pulse Gen Model: 2272
Pulse Gen Serial Number: 9174760

## 2020-12-29 ENCOUNTER — Ambulatory Visit (INDEPENDENT_AMBULATORY_CARE_PROVIDER_SITE_OTHER): Payer: Self-pay | Admitting: *Deleted

## 2020-12-29 ENCOUNTER — Other Ambulatory Visit: Payer: Self-pay | Admitting: Family Medicine

## 2020-12-29 ENCOUNTER — Other Ambulatory Visit: Payer: Self-pay

## 2020-12-29 ENCOUNTER — Encounter: Payer: Self-pay | Admitting: Internal Medicine

## 2020-12-29 ENCOUNTER — Ambulatory Visit (INDEPENDENT_AMBULATORY_CARE_PROVIDER_SITE_OTHER): Payer: Self-pay | Admitting: Internal Medicine

## 2020-12-29 VITALS — BP 146/72 | HR 60 | Ht 68.5 in | Wt 205.8 lb

## 2020-12-29 DIAGNOSIS — Z8673 Personal history of transient ischemic attack (TIA), and cerebral infarction without residual deficits: Secondary | ICD-10-CM

## 2020-12-29 DIAGNOSIS — I480 Paroxysmal atrial fibrillation: Secondary | ICD-10-CM

## 2020-12-29 DIAGNOSIS — R221 Localized swelling, mass and lump, neck: Secondary | ICD-10-CM

## 2020-12-29 DIAGNOSIS — Z5181 Encounter for therapeutic drug level monitoring: Secondary | ICD-10-CM

## 2020-12-29 DIAGNOSIS — I495 Sick sinus syndrome: Secondary | ICD-10-CM

## 2020-12-29 DIAGNOSIS — I4891 Unspecified atrial fibrillation: Secondary | ICD-10-CM

## 2020-12-29 DIAGNOSIS — Z79899 Other long term (current) drug therapy: Secondary | ICD-10-CM

## 2020-12-29 LAB — POCT INR: INR: 3 (ref 2.0–3.0)

## 2020-12-29 NOTE — Patient Instructions (Signed)
Description   Continue taking Warfarin 1 tablet daily except for 1/2 tablet on Tuesday, Thursday and Saturday. Recheck INR in 4 weeks. Coumadin Clinic [phone].

## 2020-12-29 NOTE — Patient Instructions (Signed)
Medication Instructions:  Your physician recommends that you continue on your current medications as directed. Please refer to the Current Medication list given to you today.  *If you need a refill on your cardiac medications before your next appointment, please call your pharmacy*   Lab Work: TSH and Liver Panel today  If you have labs (blood work) drawn today and your tests are completely normal, you will receive your results only by: [name] MyChart Message (if you have MyChart) OR . A paper copy in the mail If you have any lab test that is abnormal or we need to change your treatment, we will call you to review the results.   Testing/Procedures: None ordered.    Follow-Up: At [facility], you and your health needs are our priority.  As part of our continuing mission to provide you with exceptional heart care, we have created designated Provider Care Teams.  These Care Teams include your primary Cardiologist (physician) and Advanced Practice Providers (APPs -  Physician Assistants and Nurse Practitioners) who all work together to provide you with the care you need, when you need it.  We recommend signing up for the patient portal called "MyChart".  Sign up information is provided on this After Visit Summary.  MyChart is used to connect with patients for Virtual Visits (Telemedicine).  Patients are able to view lab/test results, encounter notes, upcoming appointments, etc.  Non-urgent messages can be sent to your provider as well.   To learn more about what you can do with MyChart, go to [email or web address].    Your next appointment:   6 month(s)  The format for your next appointment:   In Person  Provider:   Virl Axe, MD

## 2020-12-29 NOTE — Progress Notes (Unsigned)
Please schedule neck ultrasound for this patient.  Cardiology sent me a note indicating she did have neck swelling. Thanks

## 2020-12-29 NOTE — Progress Notes (Signed)
Patient Care Team: [name], MD as PCP - General (Family Medicine) [name], MD as PCP - Cardiology (Cardiology) [name], MD as PCP - Electrophysiology (Cardiology) [name], MD as Consulting Physician (General Surgery)   HPI  CC atrial [name] is a 55 y.o. female seen in followup for atrial flutter and sinus node dysfunction prompting pacemaker implantation 11/20 [facility]   Cardioversion was delayed because of COVID. Cardioversion 6/20 was followed by sinus rhythm.  Unfortunately, she reverted soon to atrial fibrillation however her heart rate was quite slow.  Digoxin was reduced.  Heart rates remained low and diltiazem was discontinued.  More recently, she has had problems with recurrent tachypalpitations.  Discussions 8/20 regarding antiarrhythmic therapy versus catheter ablation resulted in decision to proceed with dofetilide.  She was admitted and reverted to sinus rhythm; however, QT prolongation prompted decreased dose and reemergence of atrial fibrillation.  Amiodarone was initiated.  Because of cost, she has been treated with Coumadin.       She has a history of mitral valve for severe mid valve prolapse and concomitant bypass surgery 2013 and dilated cardiomyopathy which is thought to be rate related (see below)  There was major improvement   History of a [facility] 2017 treated with TPA.  Event recorder that time showed no atrial fibrillation   DATE TEST EF   3/13 Echo   35-40 %   4/13 Echo  55-65%   3/20  Echo  30% LA 51//51  7/20 Echo  60-65% No LA measurement     Date Cr K Hgb TSH LFTs  3/20    11.7 (6/20) 1.6    9/20 0.84 4.0    19  12/20 0.9 3.9  1.83 (11/20) 17 (10/20)  2/21 1.01 4.7       Thromboembolic risk factors (, HTN-1, TIA/CVA-2, Vasc disease -1, CHF-1, Gender-1) for a CHADSVASc Score of >=6  No interval atrial fibrillation of which she is aware.  No bleeding.  Some chest  discomfort but mostly with lifting not with walking or carrying.  No nocturnal dyspnea or orthopnea.  Lots of stress.   Past Medical History:  Diagnosis Date  . Abdominal pain    ? chronic cholecystitis; admxn to [name] 9/12 (CT, USN, HIDA done)  . Atrial fibrillation ([place])   . Cardiomyopathy    echo 9/12: mild LVH, EF 35-40%, mod MR (difficult to judge /post leaflet restricted), mod BAE, mod RVE, mod TR   . Cardiomyopathy ([name])    a. echo 9/12: mild LVH, EF 35-40%, mod MR (difficult to judge /post leaflet restricted), mod BAE, mod RVE, mod TR. b. Echo 11/2011: mild LVH, EF 55-60%, s/p MV repair w/o sig MR/MS, mildly dilated RV/RA, PASP 51mmHg. // AFL dx 10/2018 - Echo 10/2018: EF 30, diff HK, s/p MV repair with trivial MR, mean MV 7 but not c/w significant mitral stenosis, mild RAE, severe LAE, mod reduced RVSF   . Carotid artery disease ([name])    a. Duplex 06/2015: 50% RECA, 1-39% BICA.  [name] Coronary artery disease    a. s/p CABG 10/2011 - LIMA-LAD, SVG-PDA, SVG-OM, SVG-diagonal.  . Dyslipidemia   . Essential hypertension   . Hemifacial spasm   . History of Doppler ultrasound    a. Carotid US [name], [name]) 7/17: no hemodynamically significant ICA stenosis  . Mitral regurgitation    a. s/p MV repair 10/2011 - on  Coumadin for 3 months afterwards then d/c'd by surgery.  . Recurrent upper respiratory infection (URI)   . 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 Stroke [facility])    a. age 30 - in setting of cigs and OCPs. //  b. s/p acute L parietal CVA >> tPA - [name] in [place], [name]  . Tobacco abuse     Past Surgical History:  Procedure Laterality Date  . CARDIAC CATHETERIZATION    . CARDIOVERSION N/A 01/25/2019   Procedure: CARDIOVERSION;  Surgeon: [name], MD;  Location: [name];  Service: Cardiovascular;  Laterality: N/A;  . CARDIOVERSION N/A 06/14/2019   Procedure: CARDIOVERSION;  Surgeon: [name], MD;   Location: [name];  Service: Cardiovascular;  Laterality: N/A;  . CESAREAN SECTION    . cesarean section    . CORONARY ARTERY BYPASS GRAFT  10/21/2011   Procedure: CORONARY ARTERY BYPASS GRAFTING (CABG);  Surgeon: [name], MD;  Location: [name];  Service: Open Heart Surgery;  Laterality: N/A;  cabg x four, using right leg greater saphenous vein harvested endoscopically  . DENTAL SURGERY    . MITRAL VALVE REPAIR  10/21/2011   Procedure: MITRAL VALVE REPAIR (MVR);  Surgeon: [name], MD;  Location: [name];  Service: Open Heart Surgery;  Laterality: N/A;  . PACEMAKER IMPLANT N/A 06/25/2019   Procedure: PACEMAKER IMPLANT;  Surgeon: [name], MD;  Location: [name];  Service: Cardiovascular;  Laterality: N/A;  . TEE WITHOUT CARDIOVERSION  06/30/2011   Procedure: TRANSESOPHAGEAL ECHOCARDIOGRAM (TEE);  Surgeon: [name], MD;  Location: [name];  Service: Cardiovascular;  Laterality: N/A;  . TONSILLECTOMY    . TUBAL LIGATION      Current Meds  Medication Sig  . acetaminophen (TYLENOL) 500 MG tablet Take 500-1,000 mg by mouth every 6 (six) hours as needed for headache.  [name] amiodarone (PACERONE) 200 MG tablet Take 1 tablet (200 mg total) by mouth daily.  [name] amLODipine (NORVASC) 10 MG tablet Take 1 tablet (10 mg total) by mouth daily.  [name] atorvastatin (LIPITOR) 80 MG tablet Take 1 tablet (80 mg total) by mouth daily.  . diphenhydrAMINE (BENADRYL) 25 MG tablet Take 25 mg by mouth every 6 (six) hours as needed for allergies.  . famotidine (PEPCID) 20 MG tablet Take 20 mg by mouth daily as needed for indigestion.   . furosemide (LASIX) 20 MG tablet TAKE 1 TABLET BY MOUTH AS NEEDED FOR  SWELLING.  [name] losartan (COZAAR) 50 MG tablet Take 1.5 tablets (75 mg total) by mouth daily.  . Saline 0.2 % SOLN Place 1 spray into both nostrils as needed (for congestion).   . vitamin B-12 (CYANOCOBALAMIN) 1000 MCG tablet Take 1 tablet (1,000 mcg total) by mouth daily.  . vitamin C  (ASCORBIC ACID) 500 MG tablet Take 1,000 mg by mouth 2 (two) times daily.   [name] warfarin (JANTOVEN) 5 MG tablet TAKE ONE-HALF TO ONE TABLET BY MOUTH ONE TIME DAILY AS DIRECTED BY COUMADIN CLINIC   Current Facility-Administered Medications for the 12/29/20 encounter (Office Visit) with [name], MD  Medication  . incobotulinumtoxinA (XEOMIN) 50 units injection 50 Units  . incobotulinumtoxinA (XEOMIN) 50 units injection 50 Units  . incobotulinumtoxinA (XEOMIN) 50 units injection 50 Units    Allergies  Allergen Reactions  . Azithromycin Nausea Only  . Latex Rash  . Spinach Hives  . Tape Other (See Comments)    PLEASE  USE PAPER TAPE!! AFFECTS SKIN BADLY!!!!      Review of Systems negative except from HPI and PMH  Physical Exam BP (!) 146/72   Pulse 60   Ht 5' 8.5" (1.74 m)   Wt 205 lb 12.8 oz (93.4 kg)   LMP 05/18/2015 Comment: went for 3 week after skipping two months   SpO2 96%   BMI 30.84 kg/m  Well developed and nourished in no acute distress HENT normal Neck supple with JVP-7 cm* Clear Device pocket well healed; without hematoma or erythema.  There is no tethering  Regular rate and rhythm, 2/6 murmur  r gallops Abd-soft with active BS No Clubbing cyanosis edema Skin-warm and dry A & Oriented  Grossly normal sensory and motor function  ECG atrial pacing at 60 Intervals 19/11/26  Assessment and  Plan Atrial fibrillation/flutter with a controlled ventricular rate  Mitral valve prolapse--repair/CABG  Left atriopathy  Obesity  Right ptosis-chronic   Sinus node dysfunction  High Risk Medication Surveillance AMIODARONE  Pacemaker St Jude   Grief sudden death in her husband    Atrial fibrillation burden remains about 3-4%.  Recent increase and is now settled down again.  We will continue her on amiodarone.  I discussed ablation before but may need to broach it again.  Has a cough, chronic, related in the past to postnasal drip and has not changed since  initiation of amiodarone  Device function is normal.  No symptoms of ischemia; on statins with a LDL at target  Fullness in her left neck.  Will reach out to her primary care physician for further evaluation       Current medicines are reviewed at length with the patient today .  The patient does not  have concerns regarding medicines.

## 2020-12-30 LAB — TSH: TSH: 1.12 u[IU]/mL (ref 0.450–4.500)

## 2020-12-30 LAB — HEPATIC FUNCTION PANEL
ALT: 25 IU/L (ref 0–32)
AST: 26 IU/L (ref 0–40)
Albumin: 4.9 g/dL (ref 3.8–4.9)
Alkaline Phosphatase: 117 IU/L (ref 44–121)
Bilirubin Total: 0.7 mg/dL (ref 0.0–1.2)
Bilirubin, Direct: 0.23 mg/dL (ref 0.00–0.40)
Total Protein: 7.1 g/dL (ref 6.0–8.5)

## 2020-12-31 NOTE — Progress Notes (Signed)
Pt was called and a VM was left informing patient that she can view her appointment date and time via her mychart.

## 2021-01-04 ENCOUNTER — Other Ambulatory Visit: Payer: Self-pay

## 2021-01-04 ENCOUNTER — Other Ambulatory Visit: Payer: Self-pay | Admitting: Family Medicine

## 2021-01-04 ENCOUNTER — Ambulatory Visit (HOSPITAL_COMMUNITY)
Admission: RE | Admit: 2021-01-04 | Discharge: 2021-01-04 | Disposition: A | Discharge status: Home / Self Care | Payer: Medicaid Other | Source: Ambulatory Visit | Attending: Family Medicine | Admitting: Family Medicine

## 2021-01-04 DIAGNOSIS — R221 Localized swelling, mass and lump, neck: Secondary | ICD-10-CM | POA: Insufficient documentation

## 2021-01-04 DIAGNOSIS — Z1211 Encounter for screening for malignant neoplasm of colon: Secondary | ICD-10-CM

## 2021-01-06 ENCOUNTER — Other Ambulatory Visit: Payer: Self-pay | Admitting: Family Medicine

## 2021-01-06 DIAGNOSIS — R195 Other fecal abnormalities: Secondary | ICD-10-CM

## 2021-01-06 LAB — FECAL OCCULT BLOOD, IMMUNOCHEMICAL: Fecal Occult Bld: POSITIVE — AB

## 2021-01-07 ENCOUNTER — Encounter: Payer: Self-pay | Admitting: Physician Assistant

## 2021-01-13 NOTE — Progress Notes (Signed)
Remote pacemaker transmission.   

## 2021-01-15 ENCOUNTER — Other Ambulatory Visit: Payer: Self-pay

## 2021-01-15 MED ORDER — AMLODIPINE BESYLATE 10 MG PO TABS: 10.0000 mg | ORAL_TABLET | Freq: Every day | ORAL | 3 refills | Status: DC

## 2021-01-22 ENCOUNTER — Other Ambulatory Visit: Payer: Self-pay | Admitting: *Deleted

## 2021-01-22 MED ORDER — ATORVASTATIN CALCIUM 80 MG PO TABS: 80.0000 mg | ORAL_TABLET | Freq: Every day | ORAL | 3 refills | Status: DC

## 2021-01-28 ENCOUNTER — Other Ambulatory Visit: Payer: Self-pay

## 2021-01-28 ENCOUNTER — Ambulatory Visit (INDEPENDENT_AMBULATORY_CARE_PROVIDER_SITE_OTHER): Payer: Self-pay | Admitting: *Deleted

## 2021-01-28 DIAGNOSIS — Z8673 Personal history of transient ischemic attack (TIA), and cerebral infarction without residual deficits: Secondary | ICD-10-CM

## 2021-01-28 DIAGNOSIS — Z5181 Encounter for therapeutic drug level monitoring: Secondary | ICD-10-CM

## 2021-01-28 DIAGNOSIS — I4891 Unspecified atrial fibrillation: Secondary | ICD-10-CM

## 2021-01-28 LAB — POCT INR: INR: 2.4 (ref 2.0–3.0)

## 2021-01-28 NOTE — Patient Instructions (Signed)
Description   Continue taking Warfarin 1 tablet daily except for 1/2 tablet on Tuesday, Thursday and Saturday. Recheck INR in 5 weeks. Coumadin Clinic [phone].

## 2021-02-02 ENCOUNTER — Other Ambulatory Visit: Payer: Self-pay | Admitting: Internal Medicine

## 2021-02-03 ENCOUNTER — Telehealth: Payer: Self-pay

## 2021-02-03 ENCOUNTER — Encounter: Payer: Self-pay | Admitting: Physician Assistant

## 2021-02-03 ENCOUNTER — Other Ambulatory Visit (INDEPENDENT_AMBULATORY_CARE_PROVIDER_SITE_OTHER): Payer: Self-pay

## 2021-02-03 ENCOUNTER — Ambulatory Visit (INDEPENDENT_AMBULATORY_CARE_PROVIDER_SITE_OTHER): Payer: Self-pay | Admitting: Physician Assistant

## 2021-02-03 VITALS — BP 144/60 | HR 53 | Ht 68.0 in | Wt 206.0 lb

## 2021-02-03 DIAGNOSIS — R195 Other fecal abnormalities: Secondary | ICD-10-CM

## 2021-02-03 DIAGNOSIS — Z7901 Long term (current) use of anticoagulants: Secondary | ICD-10-CM

## 2021-02-03 DIAGNOSIS — I480 Paroxysmal atrial fibrillation: Secondary | ICD-10-CM

## 2021-02-03 LAB — CBC WITH DIFFERENTIAL/PLATELET
Basophils Absolute: 0.1 10*3/uL (ref 0.0–0.1)
Basophils Relative: 1 % (ref 0.0–3.0)
Eosinophils Absolute: 0.1 10*3/uL (ref 0.0–0.7)
Eosinophils Relative: 1.7 % (ref 0.0–5.0)
HCT: 37.8 % (ref 36.0–46.0)
Hemoglobin: 12.7 g/dL (ref 12.0–15.0)
Lymphocytes Relative: 15.4 % (ref 12.0–46.0)
Lymphs Abs: 1 10*3/uL (ref 0.7–4.0)
MCHC: 33.7 g/dL (ref 30.0–36.0)
MCV: 94 fl (ref 78.0–100.0)
Monocytes Absolute: 0.9 10*3/uL (ref 0.1–1.0)
Monocytes Relative: 13.3 % — ABNORMAL HIGH (ref 3.0–12.0)
Neutro Abs: 4.5 10*3/uL (ref 1.4–7.7)
Neutrophils Relative %: 68.6 % (ref 43.0–77.0)
Platelets: 247 10*3/uL (ref 150.0–400.0)
RBC: 4.03 Mil/uL (ref 3.87–5.11)
RDW: 15.1 % (ref 11.5–15.5)
WBC: 6.6 10*3/uL (ref 4.0–10.5)

## 2021-02-03 NOTE — Progress Notes (Signed)
Subjective:    Patient ID: [place], female    DOB: 03-24-1966, 55 y.o.   MRN: [id number]  HPI Ashley [name] is a pleasant 55 year old white female, new to GI today referred by Dr. [name] for heme positive stool. Patient has not had any prior GI evaluation/no prior colonoscopy.  She completed the Hemoccult as part of Routine Physical.  Patient is chronically anticoagulated on Coumadin, has history of atrial fibrillation/flutter, and is followed by Dr. Caryl Comes.  Also with coronary artery disease status post CABG x4 and previous mitral valve repair.  She had a CVA in 2018 and prior CVA 1989.  She has history of cardiomyopathy however most recent echo July 2020 showed EF of 60%, history of sick sinus syndrome status post pacemaker.  Patient has no current GI symptoms.  She denies any abdominal pain, has not had any changes in bowel habits and no ongoing issues with diarrhea or constipation.  She has not noted any melena or hematochezia.  She says she has very occasional heartburn for which she will take a Pepcid once a month or so, no dysphagia or odynophagia.  No use of aspirin or NSAIDs. Family history is negative for GI disease as far she is aware  No recent CBC.   Review of Systems. Pertinent positive and negative review of systems were noted in the above HPI section.  All other review of systems was otherwise negative.   Outpatient Encounter Medications as of 02/03/2021  Medication Sig   acetaminophen (TYLENOL) 500 MG tablet Take 500-1,000 mg by mouth every 6 (six) hours as needed for headache.   amiodarone (PACERONE) 200 MG tablet Take 1 tablet (200 mg total) by mouth daily.   amLODipine (NORVASC) 10 MG tablet Take 1 tablet (10 mg total) by mouth daily.   atorvastatin (LIPITOR) 80 MG tablet Take 1 tablet (80 mg total) by mouth daily.   diphenhydrAMINE (BENADRYL) 25 MG tablet Take 25 mg by mouth every 6 (six) hours as needed for allergies.   famotidine (PEPCID) 20 MG tablet  Take 20 mg by mouth daily as needed for indigestion.    furosemide (LASIX) 20 MG tablet TAKE 1 TABLET BY MOUTH AS NEEDED FOR  SWELLING. (Patient taking differently: daily as needed. TAKE 1 TABLET BY MOUTH AS NEEDED FOR  SWELLING.)   losartan (COZAAR) 50 MG tablet Take 1.5 tablets (75 mg total) by mouth daily.   Saline 0.2 % SOLN Place 1 spray into both nostrils as needed (for congestion).    vitamin B-12 (CYANOCOBALAMIN) 1000 MCG tablet Take 1 tablet (1,000 mcg total) by mouth daily.   vitamin C (ASCORBIC ACID) 500 MG tablet Take 1,000 mg by mouth 2 (two) times daily.    warfarin (JANTOVEN) 5 MG tablet TAKE ONE-HALF TO ONE TABLET BY MOUTH ONE TIME DAILY AS DIRECTED BY COUMADIN CLINIC   Facility-Administered Encounter Medications as of 02/03/2021  Medication   incobotulinumtoxinA (XEOMIN) 50 units injection 50 Units   incobotulinumtoxinA (XEOMIN) 50 units injection 50 Units   incobotulinumtoxinA (XEOMIN) 50 units injection 50 Units   Allergies  Allergen Reactions   Azithromycin Nausea Only   Latex Rash   Spinach Hives   Tape Other (See Comments)    PLEASE USE PAPER TAPE!! AFFECTS SKIN BADLY!!!!   Patient Active Problem List   Diagnosis Date Noted   Pacemaker 10/08/2019   Acquired thrombophilia ([place]) 07/12/2019   Sick sinus syndrome ([place]) 06/25/2019   A-fib ([place]) 06/14/2019   Postprocedural cardiogenic shock, initial encounter ([place])  06/14/2019   Chronic combined systolic and diastolic heart failure ([place]) 06/05/2019   Atrial fibrillation ([place]) 03/22/2019   Encounter for therapeutic drug monitoring 03/22/2019   Persistent atrial fibrillation ([place])    History of stroke 01/01/2018   Former smoker 07/03/2017   B12 deficiency 06/21/2016   Hemifacial spasm 04/13/2016   Cerebrovascular accident (CVA) due to thrombosis of cerebral artery ([place])    Cardiomyopathy ([place])    Essential hypertension    S/P mitral valve repair 10/21/2011   S/P CABG x 4 10/21/2011   Coronary artery disease  07/11/2011   CAD (coronary artery disease) 06/21/2011   Dyslipidemia 06/21/2011   Mitral regurgitation 06/21/2011   Social History   Socioeconomic History   Marital status: Married    Spouse name: Not on file   Number of children: 16   Years of education: 2   Highest education level: Not on file  Occupational History   Occupation: Nature conservation officer    Comment: 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  Tobacco Use   Smoking status: Former    Packs/day: 1.00    Years: 30.00    Pack years: 30.00    Types: Cigarettes    Quit date: 05/07/2011    Years since quitting: 9.7   Smokeless tobacco: Never  Vaping Use   Vaping Use: Never used  Substance and Sexual Activity   Alcohol use: Yes    Alcohol/week: 1.0 standard drink    Types: 1 Glasses of wine per week    Comment: 1 glass of red wine nightly   Drug use: No   Sexual activity: Yes  Other Topics Concern   Not on file  Social History Narrative   Lives at home with husband and son.   Right-handed.   2 cups caffeine daily.   Social Determinants of Health   Financial Resource Strain: Not on file  Food Insecurity: Not on file  Transportation Needs: Not on file  Physical Activity: Not on file  Stress: Not on file  Social Connections: Not on file  Intimate Partner Violence: Not on file    Ashley [name]'s family history includes Bone cancer in her maternal grandmother; COPD in her father; Heart disease in her father; Hypertension in her mother; Stroke in her mother.      Objective:    Vitals:   02/03/21 1516  BP: (!) 144/60  Pulse: (!) 53  SpO2: 98%    Physical Exam Well-developed well-nourished older white female in no acute distress.   Weight, 206 BMI 31.3  HEENT; nontraumatic normocephalic, EOMI, PE R LA, sclera anicteric. Oropharynx; not examined today Neck; supple, no JVD Cardiovascular; regular rate and rhythm with S1-S2, no murmur rub or gallop sternal scar pacemaker chest wall Pulmonary; Clear  bilaterally Abdomen; soft, nontender, nondistended, no palpable mass or hepatosplenomegaly, bowel sounds are active Rectal; not done, recently documented heme positive Skin; benign exam, no jaundice rash or appreciable lesions Extremities; no clubbing cyanosis or edema skin warm and dry Neuro/Psych; alert and oriented x4, grossly nonfocal mood and affect appropriate        Assessment & Plan:   #58 55 year old female with asymptomatic heme positive stool in setting of chronic anticoagulation with Coumadin. No prior GI evaluation  Rule out occult colonic neoplasm  #2 history of atrial fib flutter #3 coronary artery disease status post CABG x4 #4 status post prior mitral valve repair #5 sick sinus syndrome status post pacemaker #6 history of CVA x2, last event 2018 #7 history of cardiomyopathy last EF  July 2000 2060%  Plan; check CBC today Patient will be scheduled for colonoscopy with Dr. Lyndel Safe.  Procedure was discussed in detail with the patient including indications risks and benefits and she is agreeable to proceed. Patient will need to hold Coumadin for 5 days prior to procedure, we will communicate with her cardiologist/Dr. Caryl Comes to assure this is reasonable for this patient.  She may benefit from Lovenox bridge, will defer to Dr. Caryl Comes. Further recommendations pending findings at colonoscopy.  Denzel Etienne S Lanea Vankirk PA-C 02/03/2021   Cc: [name], MD

## 2021-02-03 NOTE — Telephone Encounter (Signed)
[facility] Pre-operative Risk Assessment     Request for surgical clearance:     Endoscopy Procedure  What type of surgery is being performed?     Colonoscopy  When is this surgery scheduled?     03/10/2021  What type of clearance is required ?   Pharmacy  Are there any medications that need to be held prior to surgery and how long? Warfarin starting 5 days prior with possible bridge  Practice name and name of physician performing surgery?      [facility]  What is your office phone and fax number?      Phone- [phone]  [phone]  Anesthesia type (None, local, MAC, general) ?       MAC

## 2021-02-03 NOTE — Patient Instructions (Signed)
If you are age 57 or younger, your body mass index should be between 19-25. Your Body mass index is 31.32 kg/m. If this is out of the aformentioned range listed, please consider follow up with your Primary Care Provider.  __________________________________________________________  The [place] [facility] providers would like to encourage you to use [facility] to communicate with providers for non-urgent requests or questions.  Due to long hold times on the telephone, sending your provider a message by [facility] may be a faster and more efficient way to get a response.  Please allow 48 business hours for a response.  Please remember that this is for non-urgent requests.   Your provider has requested that you go to the basement level for lab work before leaving today. Press "B" on the elevator. The lab is located at the first door on the left as you exit the elevator.  You will be contaced by our office prior to your procedure for directions on holding your Coumadin/Warfarin.  If you do not hear from our office 1 week prior to your scheduled procedure, please call [phone] to discuss.  Thank you for entrusting me with your care and choosing [facility].  Amy Esterwood, PA-C

## 2021-02-04 NOTE — Telephone Encounter (Signed)
    Ashley [name] DOB:  07/15/1966  MRN:  [id number]   Primary Cardiologist: Dorris Carnes, MD  Chart reviewed as part of pre-operative protocol coverage. Given past medical history and time since last visit, based on ACC/AHA guidelines, [place] would be at acceptable risk for the planned procedure without further cardiovascular testing.   Patient with diagnosis of afib on warfarin for anticoagulation.     Procedure: colonoscopy Date of procedure: 03/10/21   CHA2DS2-VASc Score = 6  This indicates a 9.7% annual risk of stroke. The patient's score is based upon: CHF History: Yes HTN History: Yes Diabetes History: No Stroke History: Yes Vascular Disease History: Yes Age Score: 0 Gender Score: 1    She will need updated BMP prior to bridge. She will get drawn when she comes for coumadin clinic apt on 7/13 Platelet count 247   Per office protocol, patient can hold warfarin for 5 days prior to procedure. Patient WILL need bridging with Lovenox (enoxaparin) around procedure.   This will be coordinated in the ch st coumadin clinic.  The patient was advised that if she develops new symptoms prior to surgery to contact our office to arrange for a follow-up visit, and she verbalized understanding.  I will route this recommendation to the requesting party via Epic fax function and remove from pre-op pool.  Please call with questions.  Kathyrn Drown, NP 02/04/2021, 1:05 PM

## 2021-02-04 NOTE — Telephone Encounter (Signed)
Patient with diagnosis of afib on warfarin for anticoagulation.    Procedure: colonoscopy Date of procedure: 03/10/21  CHA2DS2-VASc Score = 6  This indicates a 9.7% annual risk of stroke. The patient's score is based upon: CHF History: Yes HTN History: Yes Diabetes History: No Stroke History: Yes Vascular Disease History: Yes Age Score: 0 Gender Score: 1    She will need updated BMP prior to bridge. She will get drawn when she comes for coumadin clinic apt on 7/13 Platelet count 247  Per office protocol, patient can hold warfarin for 5 days prior to procedure.   Patient WILL need bridging with Lovenox (enoxaparin) around procedure.  This will be coordinated in the ch st coumadin clinic.

## 2021-02-05 NOTE — Telephone Encounter (Signed)
I have spoken to patient to advise that per cardiology, she may hold coumadin 5 days prior to procedure but she WILL need Lovenox bridge. Patient verbalizes understanding of this and indicates that she has already spoken to cardiology about this and is scheduled for lovenox bridging.

## 2021-03-03 ENCOUNTER — Other Ambulatory Visit: Payer: Self-pay

## 2021-03-03 ENCOUNTER — Ambulatory Visit (INDEPENDENT_AMBULATORY_CARE_PROVIDER_SITE_OTHER): Payer: Self-pay | Admitting: *Deleted

## 2021-03-03 DIAGNOSIS — I4891 Unspecified atrial fibrillation: Secondary | ICD-10-CM

## 2021-03-03 DIAGNOSIS — Z8673 Personal history of transient ischemic attack (TIA), and cerebral infarction without residual deficits: Secondary | ICD-10-CM

## 2021-03-03 DIAGNOSIS — Z5181 Encounter for therapeutic drug level monitoring: Secondary | ICD-10-CM

## 2021-03-03 DIAGNOSIS — I480 Paroxysmal atrial fibrillation: Secondary | ICD-10-CM

## 2021-03-03 LAB — BASIC METABOLIC PANEL
BUN/Creatinine Ratio: 14 (ref 9–23)
BUN: 13 mg/dL (ref 6–24)
CO2: 23 mmol/L (ref 20–29)
Calcium: 9.1 mg/dL (ref 8.7–10.2)
Chloride: 102 mmol/L (ref 96–106)
Creatinine, Ser: 0.95 mg/dL (ref 0.57–1.00)
Glucose: 97 mg/dL (ref 65–99)
Potassium: 4.3 mmol/L (ref 3.5–5.2)
Sodium: 139 mmol/L (ref 134–144)
eGFR: 70 mL/min/{1.73_m2} (ref >59)

## 2021-03-03 LAB — POCT INR: INR: 2.7 (ref 2.0–3.0)

## 2021-03-03 MED ORDER — ENOXAPARIN SODIUM 150 MG/ML IJ SOSY: 150.0000 mg | PREFILLED_SYRINGE | INTRAMUSCULAR | 1 refills | Status: DC

## 2021-03-03 NOTE — Progress Notes (Signed)
7/14: Last dose of warfarin.  7/15: No warfarin or enoxaparin (Lovenox).  7/16: Inject enoxaparin 150mg  in the fatty abdominal tissue at least 2 inches from the belly button once a day at 8pm No warfarin.  7/17: Inject enoxaparin in the fatty tissue at 8pm. No warfarin.  7/18: Inject enoxaparin in the fatty tissue at 8pm. No warfarin.  7/19: No lovenox. No warfarin.  7/20: Procedure Day - No enoxaparin - Resume warfarin in the evening or as directed by doctor (take an extra half tablet with usual dose for 2 days then resume normal dose).  7/21: Resume enoxaparin inject in the fatty tissue at 8am and take warfarin  7/22: Inject enoxaparin in the fatty tissue at 8am and take warfarin  7/23: Inject enoxaparin in the fatty tissue at 8 am and take warfarin  7/24: Inject enoxaparin in the fatty tissue at 8am and take warfarin  7/25: Inject enoxaparin in the fatty tissue at 8am and take warfarin  7/26: warfarin appt to check INR.

## 2021-03-03 NOTE — Patient Instructions (Addendum)
7/14: Last dose of warfarin.  7/15: No warfarin or enoxaparin (Lovenox).  7/16: Inject enoxaparin 150mg  in the fatty abdominal tissue at least 2 inches from the belly button once a day at 8pm No warfarin.  7/17: Inject enoxaparin in the fatty tissue at 8pm. No warfarin.  7/18: Inject enoxaparin in the fatty tissue at 8pm. No warfarin.  7/19: No lovenox. No warfarin.  7/20: Procedure Day - No enoxaparin - Resume warfarin in the evening or as directed by doctor (take an extra half tablet with usual dose for 2 days then resume normal dose).  7/21: Resume enoxaparin inject in the fatty tissue at 8am and take warfarin  7/22: Inject enoxaparin in the fatty tissue at 8am and take warfarin  7/23: Inject enoxaparin in the fatty tissue at 8 am and take warfarin  7/24: Inject enoxaparin in the fatty tissue at 8am and take warfarin  7/25: Inject enoxaparin in the fatty tissue at 8am and take warfarin  7/26: warfarin appt to check INR.   Description   Normal dose of warfarin: 1 tablet daily except for 1/2 tablet on Tuesday, Thursday and Saturday. Recheck INR 1 week after procedure.  Coumadin Clinic [phone].

## 2021-03-04 ENCOUNTER — Telehealth: Payer: Self-pay | Admitting: Pharmacist

## 2021-03-04 NOTE — Telephone Encounter (Signed)
Called patient and advised lab work looked normal and to continue Lovenox bridge as scheduled

## 2021-03-10 ENCOUNTER — Ambulatory Visit (AMBULATORY_SURGERY_CENTER): Payer: Self-pay | Admitting: Gastroenterology

## 2021-03-10 ENCOUNTER — Encounter: Payer: Self-pay | Admitting: Gastroenterology

## 2021-03-10 ENCOUNTER — Other Ambulatory Visit: Payer: Self-pay

## 2021-03-10 VITALS — BP 136/67 | HR 63 | Temp 97.7°F | Resp 21 | Ht 68.0 in | Wt 206.0 lb

## 2021-03-10 DIAGNOSIS — K635 Polyp of colon: Secondary | ICD-10-CM

## 2021-03-10 DIAGNOSIS — K640 First degree hemorrhoids: Secondary | ICD-10-CM

## 2021-03-10 DIAGNOSIS — R195 Other fecal abnormalities: Secondary | ICD-10-CM

## 2021-03-10 DIAGNOSIS — K5730 Diverticulosis of large intestine without perforation or abscess without bleeding: Secondary | ICD-10-CM

## 2021-03-10 DIAGNOSIS — D125 Benign neoplasm of sigmoid colon: Secondary | ICD-10-CM

## 2021-03-10 MED ORDER — SODIUM CHLORIDE 0.9 % IV SOLN: 500.0000 mL | Freq: Once | INTRAVENOUS | Status: DC

## 2021-03-10 NOTE — Op Note (Signed)
[name] Procedure Date: 03/10/2021 7:18 AM MRN: [id number] Endoscopist: Jackquline Denmark , MD Age: 55 Referring MD:  Date of Birth: [birth year] Gender: Female Account #: [id number] Procedure:                Colonoscopy Indications:              Heme positive stool with normal hemoglobin. Medicines:                Monitored Anesthesia Care Procedure:                Pre-Anesthesia Assessment:                           - Prior to the procedure, a History and Physical                            was performed, and patient medications and                            allergies were reviewed. The patient's tolerance of                            previous anesthesia was also reviewed. The risks                            and benefits of the procedure and the sedation                            options and risks were discussed with the patient.                            All questions were answered, and informed consent                            was obtained. Prior Anticoagulants: The patient has                            taken Coumadin (warfarin), last dose was 5 days                            prior to procedure with Lovenox bridging. ASA Grade                            Assessment: III - A patient with severe systemic                            disease. After reviewing the risks and benefits,                            the patient was deemed in satisfactory condition to                            undergo the procedure.  After obtaining informed consent, the colonoscope                            was passed under direct vision. Throughout the                            procedure, the patient's blood pressure, pulse, and                            oxygen saturations were monitored continuously. The                            PCF-HQ190L Colonoscope was introduced through the                            anus and advanced to the the  cecum, identified by                            appendiceal orifice and ileocecal valve. The                            colonoscopy was performed without difficulty. The                            patient tolerated the procedure well. The quality                            of the bowel preparation was good. The ileocecal                            valve, appendiceal orifice, and rectum were                            photographed. Scope In: 8:21:11 AM Scope Out: 8:37:21 AM Scope Withdrawal Time: 0 hours 11 minutes 32 seconds  Total Procedure Duration: 0 hours 16 minutes 10 seconds  Findings:                 A 6 mm polyp was found in the mid sigmoid colon.                            The polyp was sessile. The polyp was removed with a                            cold snare. Resection and retrieval were complete.                           A few medium-mouthed diverticula were found in the                            sigmoid colon.                           Non-bleeding internal hemorrhoids were found during  retroflexion. The hemorrhoids were small and Grade                            I (internal hemorrhoids that do not prolapse).                           The exam was otherwise without abnormality on                            direct and retroflexion views. Complications:            No immediate complications. Estimated Blood Loss:     Estimated blood loss: none. Impression:               - One 6 mm polyp in the mid sigmoid colon, removed                            with a cold snare. Resected and retrieved.                           - Moderate sigmoid diverticulosis.                           - Non-bleeding internal hemorrhoids.                           - The examination was otherwise normal on direct                            and retroflexion views. Recommendation:           - Patient has a contact number available for                            emergencies. The  signs and symptoms of potential                            delayed complications were discussed with the                            patient. Return to normal activities tomorrow.                            Written discharge instructions were provided to the                            patient.                           - High fiber diet.                           - Resume Coumadin (warfarin) at prior dose today.                            Take Lovenox tonight and then again tomorrow.                           -  Await pathology results.                           - Repeat colonoscopy for surveillance based on                            pathology results.                           - The findings and recommendations were discussed                            with the patient's daughter. If found to be anemic                            in future, would recommend further GI evaluation. Jackquline Denmark, MD 03/10/2021 8:44:09 AM This report has been signed electronically.

## 2021-03-10 NOTE — Progress Notes (Signed)
Per Dr. Lyndel Safe after speaking with pt and her daughter, they already have written instructions on how to take LOVENOX injections.  Also pt will restart COUMADIN today at prior dose.    No problems noted in the recovery room. maw

## 2021-03-10 NOTE — Patient Instructions (Addendum)
Handouts were given to your care partner on polyps, diverticulosis, hemorrhoids, and a high fiber diet with liberal fluid intake. Restart your COUMADIN on(warfarin) at prior today.  Take LOVENOX injection as already directed per Dr. Lyndel Safe.  You may resume your other current medications today. Await biopsy results.  May take 1-3 weeks to receive pathology results. Please call if any questions or concerns.     YOU HAD AN ENDOSCOPIC PROCEDURE TODAY AT [facility]:   Refer to the procedure report that was given to you for any specific questions about what was found during the examination.  If the procedure report does not answer your questions, please call your gastroenterologist to clarify.  If you requested that your care partner not be given the details of your procedure findings, then the procedure report has been included in a sealed envelope for you to review at your convenience later.  YOU SHOULD EXPECT: Some feelings of bloating in the abdomen. Passage of more gas than usual.  Walking can help get rid of the air that was put into your GI tract during the procedure and reduce the bloating. If you had a lower endoscopy (such as a colonoscopy or flexible sigmoidoscopy) you may notice spotting of blood in your stool or on the toilet paper. If you underwent a bowel prep for your procedure, you may not have a normal bowel movement for a few days.  Please Note:  You might notice some irritation and congestion in your nose or some drainage.  This is from the oxygen used during your procedure.  There is no need for concern and it should clear up in a day or so.  SYMPTOMS TO REPORT IMMEDIATELY:  Following lower endoscopy (colonoscopy or flexible sigmoidoscopy):  Excessive amounts of blood in the stool  Significant tenderness or worsening of abdominal pains  Swelling of the abdomen that is new, acute  Fever of 100F or higher   For urgent or emergent issues, a gastroenterologist can  be reached at any hour by calling [phone]. Do not use MyChart messaging for urgent concerns.    DIET:  We do recommend a small meal at first, but then you may proceed to your regular diet.  Drink plenty of fluids but you should avoid alcoholic beverages for 24 hours.  ACTIVITY:  You should plan to take it easy for the rest of today and you should NOT DRIVE or use heavy machinery until tomorrow (because of the sedation medicines used during the test).    FOLLOW UP: Our staff will call the number listed on your records 48-72 hours following your procedure to check on you and address any questions or concerns that you may have regarding the information given to you following your procedure. If we do not reach you, we will leave a message.  We will attempt to reach you two times.  During this call, we will ask if you have developed any symptoms of COVID 19. If you develop any symptoms (ie: fever, flu-like symptoms, shortness of breath, cough etc.) before then, please call [phone].  If you test positive for Covid 19 in the 2 weeks post procedure, please call and report this information to [place].    If any biopsies were taken you will be contacted by phone or by letter within the next 1-3 weeks.  Please call us at [phone] if you have not heard about the biopsies in 3 weeks.    SIGNATURES/CONFIDENTIALITY: You and/or your care partner have  signed paperwork which will be entered into your electronic medical record.  These signatures attest to the fact that that the information above on your After Visit Summary has been reviewed and is understood.  Full responsibility of the confidentiality of this discharge information lies with you and/or your care-partner.

## 2021-03-10 NOTE — Progress Notes (Signed)
pt tolerated well. VSS. awake and to recovery. Report given to RN.  

## 2021-03-12 ENCOUNTER — Telehealth: Payer: Self-pay

## 2021-03-12 NOTE — Telephone Encounter (Signed)
  Follow up Call-  Call back number 03/10/2021  Post procedure Call Back phone  # [phone]  Permission to leave phone message Yes  Some recent data might be hidden     Patient questions:  Do you have a fever, pain , or abdominal swelling? No. Pain Score  0 *  Have you tolerated food without any problems? Yes.    Have you been able to return to your normal activities? Yes.    Do you have any questions about your discharge instructions: Diet   No. Medications  No. Follow up visit  No.  Do you have questions or concerns about your Care? No.  Actions: * If pain score is 4 or above: No action needed, pain <4.

## 2021-03-16 ENCOUNTER — Ambulatory Visit (INDEPENDENT_AMBULATORY_CARE_PROVIDER_SITE_OTHER): Payer: Self-pay

## 2021-03-16 ENCOUNTER — Other Ambulatory Visit: Payer: Self-pay

## 2021-03-16 DIAGNOSIS — Z5181 Encounter for therapeutic drug level monitoring: Secondary | ICD-10-CM

## 2021-03-16 DIAGNOSIS — I4891 Unspecified atrial fibrillation: Secondary | ICD-10-CM

## 2021-03-16 DIAGNOSIS — Z8673 Personal history of transient ischemic attack (TIA), and cerebral infarction without residual deficits: Secondary | ICD-10-CM

## 2021-03-16 LAB — POCT INR: INR: 2.8 (ref 2.0–3.0)

## 2021-03-16 MED ORDER — FUROSEMIDE 20 MG PO TABS: ORAL_TABLET | ORAL | 2 refills | Status: DC

## 2021-03-16 MED ORDER — WARFARIN SODIUM 5 MG PO TABS: ORAL_TABLET | ORAL | 2 refills | Status: DC

## 2021-03-16 NOTE — Telephone Encounter (Signed)
Pt seen in Coumadin Clinic requesting refill on Lasix rx.

## 2021-03-16 NOTE — Patient Instructions (Signed)
Description   Stop Lovenox injections.  Continue on same dosage 1 tablet daily except for 1/2 tablet on Tuesdays, Thursdays and Saturdays. Recheck INR 4 weeks.  Coumadin Clinic [phone].

## 2021-03-23 ENCOUNTER — Ambulatory Visit (INDEPENDENT_AMBULATORY_CARE_PROVIDER_SITE_OTHER): Payer: Self-pay

## 2021-03-23 DIAGNOSIS — I495 Sick sinus syndrome: Secondary | ICD-10-CM

## 2021-03-23 LAB — CUP PACEART REMOTE DEVICE CHECK
Battery Remaining Longevity: 105 mo
Battery Remaining Percentage: 87 %
Battery Voltage: 3.02 V
Brady Statistic AP VP Percent: 1.4 %
Brady Statistic AP VS Percent: 59 %
Brady Statistic AS VP Percent: 1 %
Brady Statistic AS VS Percent: 37 %
Brady Statistic RA Percent Paced: 53 %
Brady Statistic RV Percent Paced: 2.7 %
Date Time Interrogation Session: 20220802020012
Implantable Lead Implant Date: 20201103
Implantable Lead Implant Date: 20201103
Implantable Lead Location: 753859
Implantable Lead Location: 753860
Implantable Pulse Generator Implant Date: 20201103
Lead Channel Impedance Value: 480 Ohm
Lead Channel Impedance Value: 590 Ohm
Lead Channel Pacing Threshold Amplitude: 0.75 V
Lead Channel Pacing Threshold Amplitude: 1 V
Lead Channel Pacing Threshold Pulse Width: 0.5 ms
Lead Channel Pacing Threshold Pulse Width: 0.5 ms
Lead Channel Sensing Intrinsic Amplitude: 12 mV
Lead Channel Sensing Intrinsic Amplitude: 5 mV
Lead Channel Setting Pacing Amplitude: 1 V
Lead Channel Setting Pacing Amplitude: 2 V
Lead Channel Setting Pacing Pulse Width: 0.5 ms
Lead Channel Setting Sensing Sensitivity: 2 mV
Pulse Gen Model: 2272
Pulse Gen Serial Number: 9174760

## 2021-03-24 ENCOUNTER — Encounter: Payer: Self-pay | Admitting: Gastroenterology

## 2021-04-13 ENCOUNTER — Ambulatory Visit (INDEPENDENT_AMBULATORY_CARE_PROVIDER_SITE_OTHER): Payer: Self-pay | Admitting: *Deleted

## 2021-04-13 ENCOUNTER — Other Ambulatory Visit: Payer: Self-pay

## 2021-04-13 DIAGNOSIS — Z5181 Encounter for therapeutic drug level monitoring: Secondary | ICD-10-CM

## 2021-04-13 DIAGNOSIS — I4891 Unspecified atrial fibrillation: Secondary | ICD-10-CM

## 2021-04-13 DIAGNOSIS — Z8673 Personal history of transient ischemic attack (TIA), and cerebral infarction without residual deficits: Secondary | ICD-10-CM

## 2021-04-13 LAB — POCT INR: INR: 3.1 — AB (ref 2.0–3.0)

## 2021-04-13 NOTE — Patient Instructions (Signed)
Description   Do not take your 1/2 tablet tonight then continue on same dosage 1 tablet daily except for 1/2 tablet on Tuesdays, Thursdays and Saturdays. Recheck INR 4 weeks.  Coumadin Clinic [phone].

## 2021-04-19 NOTE — Progress Notes (Signed)
Remote pacemaker transmission.   

## 2021-05-12 ENCOUNTER — Other Ambulatory Visit: Payer: Self-pay

## 2021-05-12 ENCOUNTER — Ambulatory Visit (INDEPENDENT_AMBULATORY_CARE_PROVIDER_SITE_OTHER): Payer: Self-pay | Admitting: *Deleted

## 2021-05-12 DIAGNOSIS — Z5181 Encounter for therapeutic drug level monitoring: Secondary | ICD-10-CM

## 2021-05-12 DIAGNOSIS — I4891 Unspecified atrial fibrillation: Secondary | ICD-10-CM

## 2021-05-12 DIAGNOSIS — Z8673 Personal history of transient ischemic attack (TIA), and cerebral infarction without residual deficits: Secondary | ICD-10-CM

## 2021-05-12 LAB — POCT INR: INR: 2.1 (ref 2.0–3.0)

## 2021-05-12 NOTE — Patient Instructions (Signed)
Description   Continue on same dosage 1 tablet daily except for 1/2 tablet on Tuesdays, Thursdays and Saturdays. Recheck INR 5 weeks.  Coumadin Clinic [phone].

## 2021-06-10 ENCOUNTER — Other Ambulatory Visit: Payer: Self-pay | Admitting: *Deleted

## 2021-06-10 MED ORDER — AMIODARONE HCL 200 MG PO TABS: 200.0000 mg | ORAL_TABLET | Freq: Every day | ORAL | 0 refills | Status: DC

## 2021-06-16 ENCOUNTER — Other Ambulatory Visit: Payer: Self-pay

## 2021-06-16 ENCOUNTER — Ambulatory Visit (INDEPENDENT_AMBULATORY_CARE_PROVIDER_SITE_OTHER): Payer: Self-pay

## 2021-06-16 DIAGNOSIS — Z8673 Personal history of transient ischemic attack (TIA), and cerebral infarction without residual deficits: Secondary | ICD-10-CM

## 2021-06-16 DIAGNOSIS — I4891 Unspecified atrial fibrillation: Secondary | ICD-10-CM

## 2021-06-16 LAB — POCT INR: INR: 1.7 — AB (ref 2.0–3.0)

## 2021-06-16 MED ORDER — LOSARTAN POTASSIUM 50 MG PO TABS: 75.0000 mg | ORAL_TABLET | Freq: Every day | ORAL | 1 refills | Status: DC

## 2021-06-16 NOTE — Patient Instructions (Signed)
Description   Take 2 tablets today and then continue on same dosage 1 tablet daily except for 1/2 tablet on Tuesdays, Thursdays and Saturdays. Recheck INR 2 weeks.  Coumadin Clinic [phone].

## 2021-06-22 ENCOUNTER — Ambulatory Visit (INDEPENDENT_AMBULATORY_CARE_PROVIDER_SITE_OTHER): Payer: Self-pay

## 2021-06-22 DIAGNOSIS — I4891 Unspecified atrial fibrillation: Secondary | ICD-10-CM

## 2021-06-22 LAB — CUP PACEART REMOTE DEVICE CHECK
Battery Remaining Longevity: 101 mo
Battery Remaining Percentage: 84 %
Battery Voltage: 3.02 V
Brady Statistic AP VP Percent: 1.4 %
Brady Statistic AP VS Percent: 53 %
Brady Statistic AS VP Percent: 1.4 %
Brady Statistic AS VS Percent: 43 %
Brady Statistic RA Percent Paced: 49 %
Brady Statistic RV Percent Paced: 2.9 %
Date Time Interrogation Session: 20221101020015
Implantable Lead Implant Date: 20201103
Implantable Lead Implant Date: 20201103
Implantable Lead Location: 753859
Implantable Lead Location: 753860
Implantable Pulse Generator Implant Date: 20201103
Lead Channel Impedance Value: 450 Ohm
Lead Channel Impedance Value: 590 Ohm
Lead Channel Pacing Threshold Amplitude: 0.75 V
Lead Channel Pacing Threshold Amplitude: 1 V
Lead Channel Pacing Threshold Pulse Width: 0.5 ms
Lead Channel Pacing Threshold Pulse Width: 0.5 ms
Lead Channel Sensing Intrinsic Amplitude: 12 mV
Lead Channel Sensing Intrinsic Amplitude: 5 mV
Lead Channel Setting Pacing Amplitude: 1 V
Lead Channel Setting Pacing Amplitude: 2 V
Lead Channel Setting Pacing Pulse Width: 0.5 ms
Lead Channel Setting Sensing Sensitivity: 2 mV
Pulse Gen Model: 2272
Pulse Gen Serial Number: 9174760

## 2021-06-29 NOTE — Progress Notes (Signed)
Remote pacemaker transmission.   

## 2021-06-30 ENCOUNTER — Ambulatory Visit (INDEPENDENT_AMBULATORY_CARE_PROVIDER_SITE_OTHER): Payer: Self-pay | Admitting: *Deleted

## 2021-06-30 ENCOUNTER — Other Ambulatory Visit: Payer: Self-pay

## 2021-06-30 DIAGNOSIS — Z8673 Personal history of transient ischemic attack (TIA), and cerebral infarction without residual deficits: Secondary | ICD-10-CM

## 2021-06-30 DIAGNOSIS — I4891 Unspecified atrial fibrillation: Secondary | ICD-10-CM

## 2021-06-30 DIAGNOSIS — Z5181 Encounter for therapeutic drug level monitoring: Secondary | ICD-10-CM

## 2021-06-30 LAB — POCT INR: INR: 2.5 (ref 2.0–3.0)

## 2021-06-30 MED ORDER — WARFARIN SODIUM 5 MG PO TABS: ORAL_TABLET | ORAL | 0 refills | Status: DC

## 2021-06-30 NOTE — Patient Instructions (Signed)
Description   Continue on same dosage 1 tablet daily except for 1/2 tablet on Tuesdays, Thursdays and Saturdays. Recheck INR 3 weeks.  Coumadin Clinic [phone].

## 2021-06-30 NOTE — Progress Notes (Signed)
Prescription refill request received for warfarin Lov: Caryl Comes, 12/29/2020 Next INR check: 11/30 Warfarin tablet strength: 5mg 

## 2021-07-21 ENCOUNTER — Other Ambulatory Visit: Payer: Self-pay

## 2021-07-21 ENCOUNTER — Ambulatory Visit (INDEPENDENT_AMBULATORY_CARE_PROVIDER_SITE_OTHER): Payer: Self-pay

## 2021-07-21 DIAGNOSIS — I4891 Unspecified atrial fibrillation: Secondary | ICD-10-CM

## 2021-07-21 DIAGNOSIS — Z5181 Encounter for therapeutic drug level monitoring: Secondary | ICD-10-CM

## 2021-07-21 DIAGNOSIS — Z8673 Personal history of transient ischemic attack (TIA), and cerebral infarction without residual deficits: Secondary | ICD-10-CM

## 2021-07-21 LAB — POCT INR: INR: 1.7 — AB (ref 2.0–3.0)

## 2021-07-21 NOTE — Patient Instructions (Signed)
Description   Take 1.5 tablets today, then start taking 1 tablet daily except for 1/2 tablet on Tuesdays and Saturdays. Recheck INR 3 weeks.  Coumadin Clinic [phone].

## 2021-08-10 ENCOUNTER — Other Ambulatory Visit (HOSPITAL_COMMUNITY): Payer: Self-pay | Admitting: Obstetrics and Gynecology

## 2021-08-10 DIAGNOSIS — Z0271 Encounter for disability determination: Secondary | ICD-10-CM

## 2021-08-11 ENCOUNTER — Other Ambulatory Visit: Payer: Self-pay

## 2021-08-11 ENCOUNTER — Ambulatory Visit (INDEPENDENT_AMBULATORY_CARE_PROVIDER_SITE_OTHER): Payer: Self-pay

## 2021-08-11 DIAGNOSIS — Z5181 Encounter for therapeutic drug level monitoring: Secondary | ICD-10-CM

## 2021-08-11 DIAGNOSIS — I4891 Unspecified atrial fibrillation: Secondary | ICD-10-CM

## 2021-08-11 DIAGNOSIS — Z8673 Personal history of transient ischemic attack (TIA), and cerebral infarction without residual deficits: Secondary | ICD-10-CM

## 2021-08-11 LAB — POCT INR: INR: 2.1 (ref 2.0–3.0)

## 2021-08-11 NOTE — Patient Instructions (Signed)
Description   Continue taking 1 tablet daily except for 1/2 tablet on Tuesdays and Saturdays. Recheck INR 4 weeks.  Coumadin Clinic [phone].

## 2021-08-19 ENCOUNTER — Telehealth: Payer: Self-pay | Admitting: Internal Medicine

## 2021-08-19 NOTE — Telephone Encounter (Signed)
Attempted phone call to pt to advise Dr Caryl Comes is not the ordering provider for echo and she will need to contact tht ordering provider if she has questions related to why testing was ordered. Unable to leave voicemail message due to mailbox being full.

## 2021-08-19 NOTE — Telephone Encounter (Signed)
Patient is scheduled for and Echocardiogram on Aug 27, 2021.  She would like why she has to have the Echo.  She would like for you to contact her before she has the test.

## 2021-08-26 NOTE — Telephone Encounter (Signed)
Spoke with pt and advised Dr Caryl Comes is not ordering provider for echo.  Pt states she is aware now as she received a letter re: echo scheduled for 08/27/2021.  Pt thanked Therapist, sports for the call.

## 2021-08-27 ENCOUNTER — Other Ambulatory Visit: Payer: Self-pay

## 2021-08-27 ENCOUNTER — Ambulatory Visit (HOSPITAL_COMMUNITY): Payer: Disability Insurance | Attending: Cardiology

## 2021-08-27 DIAGNOSIS — Z0271 Encounter for disability determination: Secondary | ICD-10-CM | POA: Insufficient documentation

## 2021-08-27 LAB — ECHOCARDIOGRAM COMPLETE
AR max vel: 1.93 cm2
AV Area VTI: 1.93 cm2
AV Area mean vel: 1.94 cm2
AV Mean grad: 13 mmHg
AV Peak grad: 23.8 mmHg
Ao pk vel: 2.44 m/s
Area-P 1/2: 1.67 cm2
MV M vel: 5.68 m/s
MV Peak grad: 129 mmHg
MV VTI: 1.51 cm2
Radius: 0.5 cm
S' Lateral: 4.1 cm

## 2021-09-08 ENCOUNTER — Other Ambulatory Visit: Payer: Self-pay | Admitting: Internal Medicine

## 2021-09-13 ENCOUNTER — Other Ambulatory Visit: Payer: Self-pay

## 2021-09-13 ENCOUNTER — Ambulatory Visit (INDEPENDENT_AMBULATORY_CARE_PROVIDER_SITE_OTHER): Payer: Self-pay | Admitting: *Deleted

## 2021-09-13 DIAGNOSIS — I4891 Unspecified atrial fibrillation: Secondary | ICD-10-CM

## 2021-09-13 DIAGNOSIS — Z8673 Personal history of transient ischemic attack (TIA), and cerebral infarction without residual deficits: Secondary | ICD-10-CM

## 2021-09-13 DIAGNOSIS — Z5181 Encounter for therapeutic drug level monitoring: Secondary | ICD-10-CM

## 2021-09-13 LAB — POCT INR: INR: 2.8 (ref 2.0–3.0)

## 2021-09-13 NOTE — Patient Instructions (Addendum)
Description   Continue taking 1 tablet daily except for 1/2 tablet on Tuesdays and Saturdays. Recheck INR 4 weeks. Coumadin Clinic [phone].

## 2021-09-21 ENCOUNTER — Ambulatory Visit (INDEPENDENT_AMBULATORY_CARE_PROVIDER_SITE_OTHER): Payer: Self-pay

## 2021-09-21 ENCOUNTER — Telehealth: Payer: Self-pay

## 2021-09-21 DIAGNOSIS — I495 Sick sinus syndrome: Secondary | ICD-10-CM

## 2021-09-21 LAB — CUP PACEART REMOTE DEVICE CHECK
Battery Remaining Longevity: 58 mo
Battery Remaining Percentage: 82 %
Battery Voltage: 3.01 V
Brady Statistic AP VP Percent: 1.4 %
Brady Statistic AP VS Percent: 55 %
Brady Statistic AS VP Percent: 2.5 %
Brady Statistic AS VS Percent: 40 %
Brady Statistic RA Percent Paced: 49 %
Brady Statistic RV Percent Paced: 3.9 %
Date Time Interrogation Session: 20230131020017
Implantable Lead Implant Date: 20201103
Implantable Lead Implant Date: 20201103
Implantable Lead Location: 753859
Implantable Lead Location: 753860
Implantable Pulse Generator Implant Date: 20201103
Lead Channel Impedance Value: 430 Ohm
Lead Channel Impedance Value: 580 Ohm
Lead Channel Pacing Threshold Amplitude: 0.625 V
Lead Channel Pacing Threshold Amplitude: 2.25 V
Lead Channel Pacing Threshold Pulse Width: 0.5 ms
Lead Channel Pacing Threshold Pulse Width: 0.5 ms
Lead Channel Sensing Intrinsic Amplitude: 12 mV
Lead Channel Sensing Intrinsic Amplitude: 5 mV
Lead Channel Setting Pacing Amplitude: 3.75 V
Lead Channel Setting Pacing Amplitude: 5 V
Lead Channel Setting Pacing Pulse Width: 0.5 ms
Lead Channel Setting Sensing Sensitivity: 2 mV
Pulse Gen Model: 2272
Pulse Gen Serial Number: 9174760

## 2021-09-21 NOTE — Telephone Encounter (Signed)
Scheduled remote reviewed. Normal device function.  Presenting rhythm AFlutter with controlled rates, onset 09/19/21 @ 1052. AT/AF burden 1.3% (decreased), longest current ongoing episode. rates controlled. RV pacing threshold at high output (5.0v) most likely due to inability to accurately measure threshold due to recent AF episode, will adapt down upon return to sinus if normal. Rates controlled. [place]- Warfarin, on Amiodarone. Routed for persistent AF per protocol.   Next remote 91 days- JJB  Successful telephone call to patient to assess for s/s know history AF and to assist with sending manual transmission for V auto capture assessment. Output returned to 1.0 as patient is now in 1:1 tachy rhythm (standing during transmission). Patient states she is not symptomatic with her AF but is aware of intermittent palpitations. She has been engaged with AF clinic in the past and aware to call for appointment if she becomes more symptomatic or if her HR increases. She is provided AF clinic contact. Medications reviewed. Patient compliant with amio, lasix and coumadin. Does endorse viral illness at beginning of Jan however home covid test was negative. Patient is overdue for appointment with Dr. Caryl Comes as there was no response from recall. Will send to scheduling. Patient aware.

## 2021-09-29 NOTE — Progress Notes (Signed)
Remote pacemaker transmission.   

## 2021-10-08 ENCOUNTER — Other Ambulatory Visit: Payer: Self-pay | Admitting: Internal Medicine

## 2021-10-11 ENCOUNTER — Other Ambulatory Visit: Payer: Self-pay

## 2021-10-11 ENCOUNTER — Ambulatory Visit (INDEPENDENT_AMBULATORY_CARE_PROVIDER_SITE_OTHER): Payer: Self-pay | Admitting: *Deleted

## 2021-10-11 DIAGNOSIS — I4891 Unspecified atrial fibrillation: Secondary | ICD-10-CM

## 2021-10-11 DIAGNOSIS — Z5181 Encounter for therapeutic drug level monitoring: Secondary | ICD-10-CM

## 2021-10-11 DIAGNOSIS — Z8673 Personal history of transient ischemic attack (TIA), and cerebral infarction without residual deficits: Secondary | ICD-10-CM

## 2021-10-11 LAB — PROTIME-INR
INR: 6.5 (ref 0.9–1.2)
Prothrombin Time: 60.9 s — ABNORMAL HIGH (ref 9.1–12.0)

## 2021-10-11 LAB — POCT INR: INR: 6.4 — AB (ref 2.0–3.0)

## 2021-10-11 NOTE — Patient Instructions (Signed)
Description   Spoke with pt and advised to hold today's dose, hold tomorrow's dose, and Wednesday's dose then continue taking 1 tablet daily except for 1/2 tablet on Tuesdays and Saturdays. Recheck INR 1 week. Coumadin Clinic [phone].

## 2021-10-18 ENCOUNTER — Other Ambulatory Visit: Payer: Self-pay

## 2021-10-18 ENCOUNTER — Ambulatory Visit (INDEPENDENT_AMBULATORY_CARE_PROVIDER_SITE_OTHER): Payer: Self-pay

## 2021-10-18 DIAGNOSIS — I4891 Unspecified atrial fibrillation: Secondary | ICD-10-CM

## 2021-10-18 DIAGNOSIS — Z5181 Encounter for therapeutic drug level monitoring: Secondary | ICD-10-CM

## 2021-10-18 DIAGNOSIS — Z8673 Personal history of transient ischemic attack (TIA), and cerebral infarction without residual deficits: Secondary | ICD-10-CM

## 2021-10-18 LAB — POCT INR: INR: 1.7 — AB (ref 2.0–3.0)

## 2021-10-18 NOTE — Patient Instructions (Signed)
Description   Take 1.5 tablets today and then continue taking 1 tablet daily except for 1/2 tablet on Tuesdays and Saturdays. Recheck INR 2 weeks. Coumadin Clinic [phone].

## 2021-11-01 ENCOUNTER — Ambulatory Visit (INDEPENDENT_AMBULATORY_CARE_PROVIDER_SITE_OTHER): Payer: Medicaid Other | Admitting: Internal Medicine

## 2021-11-01 ENCOUNTER — Encounter: Payer: Self-pay | Admitting: Internal Medicine

## 2021-11-01 ENCOUNTER — Other Ambulatory Visit: Payer: Self-pay

## 2021-11-01 ENCOUNTER — Ambulatory Visit (INDEPENDENT_AMBULATORY_CARE_PROVIDER_SITE_OTHER): Payer: Medicaid Other | Admitting: *Deleted

## 2021-11-01 VITALS — BP 124/68 | HR 122 | Ht 68.0 in | Wt 204.4 lb

## 2021-11-01 DIAGNOSIS — R600 Localized edema: Secondary | ICD-10-CM | POA: Diagnosis not present

## 2021-11-01 DIAGNOSIS — Z8673 Personal history of transient ischemic attack (TIA), and cerebral infarction without residual deficits: Secondary | ICD-10-CM

## 2021-11-01 DIAGNOSIS — Z950 Presence of cardiac pacemaker: Secondary | ICD-10-CM | POA: Diagnosis not present

## 2021-11-01 DIAGNOSIS — E039 Hypothyroidism, unspecified: Secondary | ICD-10-CM

## 2021-11-01 DIAGNOSIS — I4891 Unspecified atrial fibrillation: Secondary | ICD-10-CM | POA: Diagnosis not present

## 2021-11-01 DIAGNOSIS — Z5181 Encounter for therapeutic drug level monitoring: Secondary | ICD-10-CM

## 2021-11-01 DIAGNOSIS — I495 Sick sinus syndrome: Secondary | ICD-10-CM | POA: Diagnosis not present

## 2021-11-01 DIAGNOSIS — D649 Anemia, unspecified: Secondary | ICD-10-CM

## 2021-11-01 DIAGNOSIS — Z79899 Other long term (current) drug therapy: Secondary | ICD-10-CM | POA: Diagnosis not present

## 2021-11-01 DIAGNOSIS — I480 Paroxysmal atrial fibrillation: Secondary | ICD-10-CM

## 2021-11-01 LAB — POCT INR: INR: 2.1 (ref 2.0–3.0)

## 2021-11-01 MED ORDER — AMIODARONE HCL 200 MG PO TABS: ORAL_TABLET | ORAL | 0 refills | Status: DC

## 2021-11-01 MED ORDER — APIXABAN 5 MG PO TABS: 5.0000 mg | ORAL_TABLET | Freq: Two times a day (BID) | ORAL | 3 refills | Status: DC

## 2021-11-01 MED ORDER — LOSARTAN POTASSIUM 50 MG PO TABS: 75.0000 mg | ORAL_TABLET | Freq: Every day | ORAL | 1 refills | Status: DC

## 2021-11-01 NOTE — Progress Notes (Signed)
Patient Care Team: [name], MD as PCP - General (Family Medicine) [name], MD as PCP - Cardiology (Cardiology) [name], MD as PCP - Electrophysiology (Cardiology) [name], MD as Consulting Physician (General Surgery)   HPI  CC [place] is a 56 y.o. female seen in followup for atrial flutter/atrial fibrillation and sinus node dysfunction prompting pacemaker implantation 11/20 [facility].  Has ischemic heart disease with bypass surgery 2013 with mitral valve annuloplasty for also mitral valve prolapse  Recurrent tachypalpitations.  Discussions regarding antiarrhythmic therapy versus ablation chose dofetilide.  QT prolongation precluded its continuation and she was started on amiodarone.  Anticoagulations with Coumadin because of cost.  Prior stroke at an outside hospital treated tPA 2017  Recurrent palpitations.  Associated with shortness of breath.  Has developed right lower extremity edema over the last couple of weeks.  Has been on Coumadin with therapeutic INR.  Denies chest pain.  No orthopnea or nocturnal dyspnea.        DATE TEST EF    3/13 Echo   35-40 %    4/13 Echo  55-65%    3/20  Echo  30% LA 51//51  7/20 Echo  60-65% No LA measurement  1/23 Echo  50-55% LAE 51  mL/m Pressure half-time increased from 70--132 ms      Date Cr K Hgb TSH LFTs  3/20    11.7 (6/20) 1.6    9/20 0.84 4.0    19  12/20 0.9 3.9  1.83 (11/20) 17 (10/20)  2/21 1.01 4.7     7/22 0.96 4.3 12.7   1.12 26    Thromboembolic risk factors (, HTN-1, TIA/CVA-2, Vasc disease -1, CHF-1, Gender-1) for a CHADSVASc Score of >=6       Past Medical History:  Diagnosis Date   Abdominal pain    ? chronic cholecystitis; admx[facility] to [facility] 9/12 (CT, USN, HIDA done)   Atrial fibrillation ([place])    Cardiomyopathy    echo 9/12: mild LVH, EF 35-40%, mod MR (difficult to judge /post leaflet restricted), mod BAE, mod RVE, mod TR    Cardiomyopathy ([place])     a. echo 9/12: mild LVH, EF 35-40%, mod MR (difficult to judge /post leaflet restricted), mod BAE, mod RVE, mod TR. b. Echo 11/2011: mild LVH, EF 55-60%, s/p MV repair w/o sig MR/MS, mildly dilated RV/RA, PASP 30mHg. // AFL dx 10/2018 - Echo 10/2018: EF 30, diff HK, s/p MV repair with trivial MR, mean MV 7 but not c/w significant mitral stenosis, mild RAE, severe LAE, mod reduced RVSF    Carotid artery disease ([place]    a. Duplex 06/2015: 50% RECA, 1-39% BICA.   CHF (congestive heart failure) (HCC)    Clotting disorder (HCC)    Coronary artery disease    a. s/p CABG 10/2011 - LIMA-LAD, SVG-PDA, SVG-OM, SVG-diagonal.   Dyslipidemia    Essential hypertension    Heart murmur    Hemifacial spasm    History of Doppler ultrasound    a. [facility] [place] [place]: no hemodynamically significant ICA stenosis   Hyperlipidemia    Mitral regurgitation    a. s/p MV repair 10/2011 - on Coumadin for 3 months afterwards then d/c'd by surgery.   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 age  22 - in setting of cigs and OCPs. //  b. s/p acute L parietal CVA >> tPA - [facility] in [place], [name]   Stroke [facility]) 01/2017   Tobacco abuse     Past Surgical History:  Procedure Laterality Date   CARDIAC CATHETERIZATION     CARDIOVERSION N/A 01/25/2019   Procedure: CARDIOVERSION;  Surgeon: [name], MD;  Location: [facility] ENDOSCOPY;  Service: Cardiovascular;  Laterality: N/A;   CARDIOVERSION N/A 06/14/2019   Procedure: CARDIOVERSION;  Surgeon: [name], MD;  Location: [place];  Service: Cardiovascular;  Laterality: N/A;   [place]   twins   CORONARY ARTERY BYPASS GRAFT  10/21/2011   Procedure: CORONARY ARTERY BYPASS GRAFTING (CABG);  Surgeon: [name], MD;  Location: [place];  Service: Open Heart Surgery;  Laterality: N/A;  cabg x four, using right leg  greater saphenous vein harvested endoscopically   DENTAL SURGERY     MITRAL VALVE REPAIR  10/21/2011   Procedure: MITRAL VALVE REPAIR (MVR);  Surgeon: [name], MD;  Location: [place];  Service: Open Heart Surgery;  Laterality: N/A;   PACEMAKER IMPLANT N/A 06/25/2019   Procedure: PACEMAKER IMPLANT;  Surgeon: [name], MD;  Location: [place] CV LAB;  Service: Cardiovascular;  Laterality: N/A;   TEE WITHOUT CARDIOVERSION  06/30/2011   Procedure: TRANSESOPHAGEAL ECHOCARDIOGRAM (TEE);  Surgeon: [name], MD;  Location: [facility] ENDOSCOPY;  Service: Cardiovascular;  Laterality: N/A;   TONSILLECTOMY     TUBAL LIGATION      Current Meds  Medication Sig   acetaminophen (TYLENOL) 500 MG tablet Take 500-1,000 mg by mouth every 6 (six) hours as needed for headache.   amiodarone (PACERONE) 200 MG tablet TAKE ONE TABLET BY MOUTH ONE TIME DAILY   amLODipine (NORVASC) 10 MG tablet Take 1 tablet (10 mg total) by mouth daily.   atorvastatin (LIPITOR) 80 MG tablet Take 1 tablet (80 mg total) by mouth daily.   diphenhydrAMINE (BENADRYL) 25 MG tablet Take 25 mg by mouth every 6 (six) hours as needed for allergies.   enoxaparin (LOVENOX) 150 MG/ML injection Inject 1 mL (150 mg total) into the skin daily.   famotidine (PEPCID) 20 MG tablet Take 20 mg by mouth daily as needed for indigestion.    furosemide (LASIX) 20 MG tablet TAKE 1 TABLET BY MOUTH AS NEEDED FOR  SWELLING.   losartan (COZAAR) 50 MG tablet Take 1.5 tablets (75 mg total) by mouth daily.   Saline 0.2 % SOLN Place 1 spray into both nostrils as needed (for congestion).    vitamin B-12 (CYANOCOBALAMIN) 1000 MCG tablet Take 1 tablet (1,000 mcg total) by mouth daily.   vitamin C (ASCORBIC ACID) 500 MG tablet Take 1,000 mg by mouth 2 (two) times daily.    warfarin (JANTOVEN) 5 MG tablet TAKE ONE-HALF TO ONE TABLET BY MOUTH ONE TIME DAILY AS DIRECTED BY COUMADIN CLINIC   Current Facility-Administered Medications for the 11/01/21 encounter  (Office Visit) with [name], MD  Medication   incobotulinumtoxinA (XEOMIN) 50 units injection 50 Units   incobotulinumtoxinA (XEOMIN) 50 units injection 50 Units   incobotulinumtoxinA (XEOMIN) 50 units injection 50 Units    Allergies  Allergen Reactions   Azithromycin Nausea Only   Latex Rash   Spinach Hives   Tape Other (See Comments)    PLEASE USE PAPER TAPE!! AFFECTS SKIN BADLY!!!!      Review of Systems negative except from HPI and PMH  Physical  Exam BP 124/68    Pulse (!) 122    Ht '5\' 8"'$  (1.727 m)    Wt 204 lb 6.4 oz (92.7 kg)    LMP 05/18/2015 Comment: went for 3 week after skipping two months    SpO2 96%    BMI 31.08 kg/m  Well developed and well nourished in no acute distress HENT normal Neck supple   Clear Device pocket well healed; without hematoma or erythema.  There is no tethering  Rapid but regular rate and rhythm, no  gallop   Abd-soft with active BS No Clubbing cyanosis 2+ on R to mid thigh edema Skin-warm and dry A & Oriented  Grossly normal sensory and motor function  ECG atrial tachycardia at 122 Intervals 14/13/40 Right bundle branch block right axis deviation   Efforts to pace terminate were associated with suppression and then reemergence with gradual acceleration consistent with a focal atrial tachycardia   Assessment and  Plan Atrial fibrillation/flutter with a controlled ventricular rate  Atrial tachycardia  Hypertension  Mitral valve prolapse--repair/CABG - Left atriopathy-severe  Obesity  Right ptosis-chronic   Sinus node dysfunction  High Risk Medication Surveillance AMIODARONE  Pacemaker [employer]     Atrial fibrillation is relatively quiescient.  Amiodarone has been quite effective.  Needs amiodarone surveillance laboratories.  Also needs a CBC on anticoagulation.  Given the atrial tachycardia being nearly incessant and comprising a significant portion of her beats estimated about 13%, we will increase the amiodarone  to 400 twice daily again x2 weeks 200 twice daily x2 weeks.  With her left atriopathy have not sanguine an ablation procedure would be successful.  We may end up having to consider that alternatively AV junction ablation if he cannot control the rhythm.  She has unilateral lower extremity swelling.  She has been therapeutically anticoagulated albeit with labile INR.  We will check a venous Doppler.  She may need further imaging.  In the event that there is a clot she will probably need a filter.  She is otherwise euvolemic.  We will continue her on her Lasix as needed and continue her losartan 75 daily and her amlodipine at 10 daily with stable and controlled blood pressure  She was thought to have progressive mitral stenosis based on pressure half-time assessment from her most recent echo.  I have reached out to Dr. Johney Frame and  she has agreed to help care for Mrs. Kobayashi and valvular heart disease.       Current medicines are reviewed at length with the patient today .  The patient does not  have concerns regarding medicines.

## 2021-11-01 NOTE — Patient Instructions (Addendum)
Description   ?Continue to take warfarin 1 tablet daily excpet for 1/2 a tablet on Tuesday and Saturdays. Recheck INR in 2 weeks. Coumadin Clinic [phone]. ? ?Late entry- After anticoag visit pt saw Dr Caryl Comes who stopped her warfarin and started her on Eliquis. Pt is to stop warfarin and start Eliquis tomorrow. Spoke with Rosann Auerbach Dr. Olin Pia RN.  ?  ?  ?

## 2021-11-01 NOTE — Patient Instructions (Signed)
Medication Instructions:  ?Your physician has recommended you make the following change in your medication:  ? ?** Stop Warfarin ? ?** Start Eliquis '5mg'$  - 1 tablet by mouth twice daily ? ?** Restart Amiodarone '400mg'$  - 2 tablets by mouth twice daily x 2 weeks then  ? ?'200mg'$  - 1 tablet by mouith twice daily x 2 weeks then ? ?'200mg'$  - 1 tablet by mouth daily after that. ? ?*If you need a refill on your cardiac medications before your next appointment, please call your pharmacy* ? ? ?Lab Work: ?CBC, TSH and Liver panel today ? ?If you have labs (blood work) drawn today and your tests are completely normal, you will receive your results only by: ?MyChart Message (if you have MyChart) OR ?A paper copy in the mail ?If you have any lab test that is abnormal or we need to change your treatment, we will call you to review the results. ? ? ?Testing/Procedures: lower extremity doppler of right leg ?Your physician has requested that you have a lower or upper extremity venous duplex. This test is an ultrasound of the veins in the legs or arms. It looks at venous blood flow that carries blood from the heart to the legs or arms. Allow one hour for a Lower Venous exam. Allow thirty minutes for an Upper Venous exam. There are no restrictions or special instructions. ?  ? ? ? ?Follow-Up: ?At [facility], you and your health needs are our priority.  As part of our continuing mission to provide you with exceptional heart care, we have created designated Provider Care Teams.  These Care Teams include your primary Cardiologist (physician) and Advanced Practice Providers (APPs -  Physician Assistants and Nurse Practitioners) who all work together to provide you with the care you need, when you need it. ? ?We recommend signing up for the patient portal called "MyChart".  Sign up information is provided on this After Visit Summary.  MyChart is used to connect with patients for Virtual Visits (Telemedicine).  Patients are able to view  lab/test results, encounter notes, upcoming appointments, etc.  Non-urgent messages can be sent to your provider as well.   ?To learn more about what you can do with MyChart, go to [email or web address].   ? ?Your next appointment:   ?6 months with APP:1}  ? ? ?Other Instructions ?Appointment with Dr Johney Frame ?

## 2021-11-02 LAB — HEPATIC FUNCTION PANEL
ALT: 13 IU/L (ref 0–32)
AST: 15 IU/L (ref 0–40)
Albumin: 4.8 g/dL (ref 3.8–4.9)
Alkaline Phosphatase: 130 IU/L — ABNORMAL HIGH (ref 44–121)
Bilirubin Total: 1 mg/dL (ref 0.0–1.2)
Bilirubin, Direct: 0.3 mg/dL (ref 0.00–0.40)
Total Protein: 7.1 g/dL (ref 6.0–8.5)

## 2021-11-02 LAB — CBC
Hematocrit: 36.9 % (ref 34.0–46.6)
Hemoglobin: 11.3 g/dL (ref 11.1–15.9)
MCH: 28.6 pg (ref 26.6–33.0)
MCHC: 30.6 g/dL — ABNORMAL LOW (ref 31.5–35.7)
MCV: 93 fL (ref 79–97)
Platelets: 270 10*3/uL (ref 150–450)
RBC: 3.95 x10E6/uL (ref 3.77–5.28)
RDW: 13.5 % (ref 11.7–15.4)
WBC: 7 10*3/uL (ref 3.4–10.8)

## 2021-11-02 LAB — TSH: TSH: 1.86 u[IU]/mL (ref 0.450–4.500)

## 2021-11-05 ENCOUNTER — Other Ambulatory Visit: Payer: Self-pay

## 2021-11-05 ENCOUNTER — Ambulatory Visit (HOSPITAL_COMMUNITY)
Admission: RE | Admit: 2021-11-05 | Discharge: 2021-11-05 | Disposition: A | Discharge status: Home / Self Care | Payer: Medicaid Other | Source: Ambulatory Visit | Attending: Internal Medicine | Admitting: Internal Medicine

## 2021-11-05 DIAGNOSIS — R600 Localized edema: Secondary | ICD-10-CM

## 2021-12-04 ENCOUNTER — Other Ambulatory Visit: Payer: Self-pay

## 2021-12-04 ENCOUNTER — Emergency Department (HOSPITAL_COMMUNITY): Payer: Medicaid Other

## 2021-12-04 ENCOUNTER — Inpatient Hospital Stay (HOSPITAL_COMMUNITY)
Admission: EM | Admit: 2021-12-04 | Discharge: 2021-12-06 | DRG: 309 | Disposition: A | Discharge status: Home / Self Care | Payer: Medicaid Other | Attending: Internal Medicine | Admitting: Internal Medicine

## 2021-12-04 ENCOUNTER — Encounter (HOSPITAL_COMMUNITY): Payer: Self-pay | Admitting: Emergency Medicine

## 2021-12-04 DIAGNOSIS — G4733 Obstructive sleep apnea (adult) (pediatric): Secondary | ICD-10-CM | POA: Diagnosis present

## 2021-12-04 DIAGNOSIS — Z881 Allergy status to other antibiotic agents status: Secondary | ICD-10-CM

## 2021-12-04 DIAGNOSIS — I340 Nonrheumatic mitral (valve) insufficiency: Secondary | ICD-10-CM | POA: Diagnosis present

## 2021-12-04 DIAGNOSIS — I471 Supraventricular tachycardia: Principal | ICD-10-CM | POA: Diagnosis present

## 2021-12-04 DIAGNOSIS — E785 Hyperlipidemia, unspecified: Secondary | ICD-10-CM | POA: Diagnosis present

## 2021-12-04 DIAGNOSIS — I4819 Other persistent atrial fibrillation: Secondary | ICD-10-CM | POA: Diagnosis present

## 2021-12-04 DIAGNOSIS — Z6829 Body mass index (BMI) 29.0-29.9, adult: Secondary | ICD-10-CM

## 2021-12-04 DIAGNOSIS — Z9851 Tubal ligation status: Secondary | ICD-10-CM

## 2021-12-04 DIAGNOSIS — Z79899 Other long term (current) drug therapy: Secondary | ICD-10-CM

## 2021-12-04 DIAGNOSIS — Z823 Family history of stroke: Secondary | ICD-10-CM

## 2021-12-04 DIAGNOSIS — D689 Coagulation defect, unspecified: Secondary | ICD-10-CM | POA: Diagnosis present

## 2021-12-04 DIAGNOSIS — I4892 Unspecified atrial flutter: Secondary | ICD-10-CM | POA: Diagnosis present

## 2021-12-04 DIAGNOSIS — D649 Anemia, unspecified: Secondary | ICD-10-CM | POA: Diagnosis present

## 2021-12-04 DIAGNOSIS — Z91048 Other nonmedicinal substance allergy status: Secondary | ICD-10-CM

## 2021-12-04 DIAGNOSIS — I255 Ischemic cardiomyopathy: Secondary | ICD-10-CM | POA: Diagnosis present

## 2021-12-04 DIAGNOSIS — I4891 Unspecified atrial fibrillation: Secondary | ICD-10-CM | POA: Diagnosis present

## 2021-12-04 DIAGNOSIS — Z950 Presence of cardiac pacemaker: Secondary | ICD-10-CM

## 2021-12-04 DIAGNOSIS — I429 Cardiomyopathy, unspecified: Secondary | ICD-10-CM

## 2021-12-04 DIAGNOSIS — Z8673 Personal history of transient ischemic attack (TIA), and cerebral infarction without residual deficits: Secondary | ICD-10-CM

## 2021-12-04 DIAGNOSIS — I110 Hypertensive heart disease with heart failure: Secondary | ICD-10-CM | POA: Diagnosis not present

## 2021-12-04 DIAGNOSIS — E669 Obesity, unspecified: Secondary | ICD-10-CM | POA: Diagnosis present

## 2021-12-04 DIAGNOSIS — Z91018 Allergy to other foods: Secondary | ICD-10-CM

## 2021-12-04 DIAGNOSIS — I10 Essential (primary) hypertension: Secondary | ICD-10-CM | POA: Diagnosis present

## 2021-12-04 DIAGNOSIS — I509 Heart failure, unspecified: Secondary | ICD-10-CM | POA: Diagnosis not present

## 2021-12-04 DIAGNOSIS — Z951 Presence of aortocoronary bypass graft: Secondary | ICD-10-CM

## 2021-12-04 DIAGNOSIS — Z91040 Latex allergy status: Secondary | ICD-10-CM

## 2021-12-04 DIAGNOSIS — Z7901 Long term (current) use of anticoagulants: Secondary | ICD-10-CM

## 2021-12-04 DIAGNOSIS — I2510 Atherosclerotic heart disease of native coronary artery without angina pectoris: Secondary | ICD-10-CM | POA: Diagnosis present

## 2021-12-04 DIAGNOSIS — Z20822 Contact with and (suspected) exposure to covid-19: Secondary | ICD-10-CM | POA: Diagnosis present

## 2021-12-04 DIAGNOSIS — Z9889 Other specified postprocedural states: Secondary | ICD-10-CM

## 2021-12-04 DIAGNOSIS — Z8249 Family history of ischemic heart disease and other diseases of the circulatory system: Secondary | ICD-10-CM

## 2021-12-04 DIAGNOSIS — I5032 Chronic diastolic (congestive) heart failure: Secondary | ICD-10-CM | POA: Diagnosis present

## 2021-12-04 LAB — CBC
HCT: 34.4 % — ABNORMAL LOW (ref 36.0–46.0)
Hemoglobin: 10.2 g/dL — ABNORMAL LOW (ref 12.0–15.0)
MCH: 27.5 pg (ref 26.0–34.0)
MCHC: 29.7 g/dL — ABNORMAL LOW (ref 30.0–36.0)
MCV: 92.7 fL (ref 80.0–100.0)
Platelets: 349 10*3/uL (ref 150–400)
RBC: 3.71 MIL/uL — ABNORMAL LOW (ref 3.87–5.11)
RDW: 16 % — ABNORMAL HIGH (ref 11.5–15.5)
WBC: 6.2 10*3/uL (ref 4.0–10.5)
nRBC: 0 % (ref 0.0–0.2)

## 2021-12-04 LAB — BASIC METABOLIC PANEL
Anion gap: 12 (ref 5–15)
BUN: 10 mg/dL (ref 6–20)
CO2: 24 mmol/L (ref 22–32)
Calcium: 9 mg/dL (ref 8.9–10.3)
Chloride: 103 mmol/L (ref 98–111)
Creatinine, Ser: 1 mg/dL (ref 0.44–1.00)
GFR, Estimated: >60 mL/min (ref >60)
Glucose, Bld: 111 mg/dL — ABNORMAL HIGH (ref 70–99)
Potassium: 3.9 mmol/L (ref 3.5–5.1)
Sodium: 139 mmol/L (ref 135–145)

## 2021-12-04 LAB — BRAIN NATRIURETIC PEPTIDE: B Natriuretic Peptide: 1336.9 pg/mL — ABNORMAL HIGH (ref 0.0–100.0)

## 2021-12-04 LAB — HEPATIC FUNCTION PANEL
ALT: 23 U/L (ref 0–44)
AST: 26 U/L (ref 15–41)
Albumin: 3.6 g/dL (ref 3.5–5.0)
Alkaline Phosphatase: 120 U/L (ref 38–126)
Bilirubin, Direct: 0.3 mg/dL — ABNORMAL HIGH (ref 0.0–0.2)
Indirect Bilirubin: 0.8 mg/dL (ref 0.3–0.9)
Total Bilirubin: 1.1 mg/dL (ref 0.3–1.2)
Total Protein: 7.3 g/dL (ref 6.5–8.1)

## 2021-12-04 LAB — TROPONIN I (HIGH SENSITIVITY): Troponin I (High Sensitivity): 21 ng/L — ABNORMAL HIGH (ref <18)

## 2021-12-04 LAB — RESP PANEL BY RT-PCR (FLU A&B, COVID) ARPGX2
Influenza A by PCR: NEGATIVE
Influenza B by PCR: NEGATIVE
SARS Coronavirus 2 by RT PCR: NEGATIVE

## 2021-12-04 MED ORDER — DILTIAZEM LOAD VIA INFUSION
15.0000 mg | Freq: Once | INTRAVENOUS | Status: AC
  Administered 2021-12-04: 15 mg via INTRAVENOUS
  Filled 2021-12-04: qty 15

## 2021-12-04 MED ORDER — DILTIAZEM HCL-DEXTROSE 125-5 MG/125ML-% IV SOLN (PREMIX)
5.0000 mg/h | INTRAVENOUS | Status: DC
  Administered 2021-12-04: 5 mg/h via INTRAVENOUS
  Filled 2021-12-04: qty 125

## 2021-12-04 MED ORDER — FUROSEMIDE 10 MG/ML IJ SOLN
20.0000 mg | Freq: Once | INTRAMUSCULAR | Status: AC
  Administered 2021-12-04: 20 mg via INTRAVENOUS
  Filled 2021-12-04: qty 2

## 2021-12-04 NOTE — ED Triage Notes (Signed)
C/o bilateral lower extremity edema and SOB with exertion.  States she saw Dr. Caryl Comes on 3/13 for same and had meds adjusted but not feeling any better. ?

## 2021-12-04 NOTE — ED Provider Notes (Signed)
?[facility] ?Provider Note ? ? ?CSN: [id number] ?Arrival date & time: 12/04/21  1815 ? ?  ? ?History ? ?Chief Complaint  ?Patient presents with  ? Shortness of Breath  ? ? ?Ashley [name] is a 56 y.o. female. ? ?Patient is a 56 year old female with a history of hypertension, cardiomyopathy with most recent EF of 50 to 55% with a history of recurrent A-fib/flutter status post pacemaker who continues to go in and out of a tachycardic rhythm and is currently on amiodarone, mitral regurgitation status postrepair, CABG x4 vessels, CHF on daily Lasix who is currently on Coumadin and has been therapeutic who is presenting today due to elevated heart rate, lower extremity swelling and shortness of breath.  Patient reports this has been an ongoing issue for several weeks now.  She saw her cardiologist in March and at that time was increased on her amiodarone 400 mg for 2 weeks and then 200 mg for 2 weeks and she is now back on the daily dose.  She reports that did not help her heart rate at all.  When she was seen in the office he she had atrial tachycardia at that time but was otherwise doing well.  She reports in the last week she has gained 6 pounds had persistent swelling in her legs and finds it very difficult to do her daily activity due to the shortness of breath.  Her heart rate has been around 140 today all day despite taking her medications.  She denies any significant cough, congestion or fever.  She has no chest pain or abdominal pain.  She denies any vomiting or diarrhea.  She has had no medication changes except for the ones noted above. ? ?The history is provided by the patient.  ?Shortness of Breath ? ?  ? ?Home Medications ?Prior to Admission medications   ?Medication Sig Start Date End Date Taking? Authorizing Provider  ?acetaminophen (TYLENOL) 500 MG tablet Take 500-1,000 mg by mouth every 6 (six) hours as needed for headache.    [provider]   ?amiodarone (PACERONE) 200 MG tablet TAKE ONE TABLET BY MOUTH ONE TIME DAILY 09/08/21   [name], MD  ?amiodarone (PACERONE) 200 MG tablet Restart Amiodarone '400mg'$  - 2 tablet by mouth twice daily x 2 weeks then reduce to '200mg'$  - 1 tablet by mouth twice daily x 2 weeks then return to you normal dosing of '200mg'$  - 1 tablet by mouth daily 11/01/21   [name], MD  ?amLODipine (NORVASC) 10 MG tablet Take 1 tablet (10 mg total) by mouth daily. 01/15/21   [name], MD  ?apixaban (ELIQUIS) 5 MG TABS tablet Take 1 tablet (5 mg total) by mouth 2 (two) times daily. 11/01/21   [name], MD  ?atorvastatin (LIPITOR) 80 MG tablet Take 1 tablet (80 mg total) by mouth daily. 01/22/21   [name], MD  ?diphenhydrAMINE (BENADRYL) 25 MG tablet Take 25 mg by mouth every 6 (six) hours as needed for allergies.    [provider]  ?famotidine (PEPCID) 20 MG tablet Take 20 mg by mouth daily as needed for indigestion.     [provider]  ?furosemide (LASIX) 20 MG tablet TAKE 1 TABLET BY MOUTH AS NEEDED FOR  SWELLING. 03/16/21   [name], MD  ?losartan (COZAAR) 50 MG tablet Take 1.5 tablets (75 mg total) by mouth daily. 11/01/21   [name], MD  ?Saline 0.2 % SOLN  Place 1 spray into both nostrils as needed (for congestion).     [provider]  ?vitamin B-12 (CYANOCOBALAMIN) 1000 MCG tablet Take 1 tablet (1,000 mcg total) by mouth daily. 06/21/16   Rosalin Hawking, MD  ?vitamin C (ASCORBIC ACID) 500 MG tablet Take 1,000 mg by mouth 2 (two) times daily.     [provider]  ?   ? ?Allergies    ?Azithromycin, Latex, Spinach, and Tape   ? ?Review of Systems   ?Review of Systems  ?Respiratory:  Positive for shortness of breath.   ? ?Physical Exam ?Updated Vital Signs ?BP 118/75   Pulse (!) 135   Temp 98.9 ?F (37.2 ?C) (Oral)   Resp (!) 26   LMP 05/18/2015 Comment: went for 3 week after skipping two months   SpO2 94%  ?Physical Exam ?Vitals and nursing note reviewed.   ?Constitutional:   ?   General: She is not in acute distress. ?   Appearance: She is well-developed.  ?HENT:  ?   Head: Normocephalic and atraumatic.  ?Eyes:  ?   Conjunctiva/sclera: Conjunctivae normal.  ?   Pupils: Pupils are equal, round, and reactive to light.  ?Cardiovascular:  ?   Rate and Rhythm: Regular rhythm. Tachycardia present.  ?   Heart sounds: No murmur heard. ?Pulmonary:  ?   Effort: Pulmonary effort is normal. Tachypnea present. No respiratory distress.  ?   Breath sounds: Examination of the right-lower field reveals decreased breath sounds. Examination of the left-lower field reveals decreased breath sounds. Decreased breath sounds present. No wheezing or rales.  ?Abdominal:  ?   General: There is no distension.  ?   Palpations: Abdomen is soft.  ?   Tenderness: There is no abdominal tenderness. There is no guarding or rebound.  ?Musculoskeletal:     ?   General: No tenderness. Normal range of motion.  ?   Cervical back: Normal range of motion and neck supple.  ?   Right lower leg: Edema present.  ?   Left lower leg: Edema present.  ?Skin: ?   General: Skin is warm and dry.  ?   Findings: No erythema or rash.  ?Neurological:  ?   Mental Status: She is alert and oriented to person, place, and time. Mental status is at baseline.  ?Psychiatric:     ?   Mood and Affect: Mood normal.     ?   Behavior: Behavior normal.  ? ? ?ED Results / Procedures / Treatments   ?Labs ?(all labs ordered are listed, but only abnormal results are displayed) ?Labs Reviewed  ?BASIC METABOLIC PANEL - Abnormal; Notable for the following components:  ?    Result Value  ? Glucose, Bld 111 (*)   ? All other components within normal limits  ?CBC - Abnormal; Notable for the following components:  ? RBC 3.71 (*)   ? Hemoglobin 10.2 (*)   ? HCT 34.4 (*)   ? MCHC 29.7 (*)   ? RDW 16.0 (*)   ? All other components within normal limits  ?BRAIN NATRIURETIC PEPTIDE - Abnormal; Notable for the following components:  ? B Natriuretic  Peptide 1,336.9 (*)   ? All other components within normal limits  ?HEPATIC FUNCTION PANEL - Abnormal; Notable for the following components:  ? Bilirubin, Direct 0.3 (*)   ? All other components within normal limits  ?TROPONIN I (HIGH SENSITIVITY) - Abnormal; Notable for the following components:  ? Troponin I (High Sensitivity) 21 (*)   ?  All other components within normal limits  ?RESP PANEL BY RT-PCR (FLU A&B, COVID) ARPGX2  ?I-STAT BETA HCG BLOOD, ED ([facility], [facility], [facility] ONLY)  ?TROPONIN I (HIGH SENSITIVITY)  ? ? ?EKG ?EKG Interpretation ? ?Date/Time:  Saturday December 04 2021 18:26:20 EDT ?Ventricular Rate:  138 ?PR Interval:  104 ?QRS Duration: 128 ?QT Interval:  338 ?QTC Calculation: 512 ?R Axis:   137 ?Text Interpretation: tachycardia Right bundle branch block When compared with ECG of 12-Jul-2019 10:32, PREVIOUS ECG IS PRESENT Confirmed by Blanchie Dessert [phone]) on 12/04/2021 9:26:10 PM ? ?Radiology ?DG Chest 2 View ? ?Result Date: 12/04/2021 ?CLINICAL DATA:  Shortness of breath. EXAM: CHEST - 2 VIEW COMPARISON:  June 26, 2019 FINDINGS: There is a dual lead AICD. Multiple sternal wires and vascular clips are seen. Stable, chronic appearing increased lung markings are noted without evidence of focal consolidation, pleural effusion or pneumothorax. The cardiac silhouette is mildly enlarged and unchanged in size. There is moderate severity calcification of the aortic arch. The visualized skeletal structures are unremarkable. IMPRESSION: 1. Evidence of prior median sternotomy/CABG. 2. Stable cardiomegaly with chronic appearing increased lung markings. 3. No acute or active cardiopulmonary disease. Electronically Signed   By: Virgina Norfolk M.D.   On: 12/04/2021 20:52   ? ?Procedures ?Procedures  ? ? ?Medications Ordered in ED ?Medications  ?diltiazem (CARDIZEM) 1 mg/mL load via infusion 15 mg (15 mg Intravenous Bolus from Bag 12/04/21 2313)  ?  And  ?diltiazem (CARDIZEM) 125 mg in dextrose 5% 125 mL (1 mg/mL)  infusion (5 mg/hr Intravenous New Bag/Given 12/04/21 2313)  ?furosemide (LASIX) injection 20 mg (20 mg Intravenous Given 12/04/21 2308)  ? ? ?ED Course/ Medical Decision Making/ A&P ?  ?                        ?Medical Decision M

## 2021-12-05 ENCOUNTER — Inpatient Hospital Stay (HOSPITAL_COMMUNITY): Payer: Medicaid Other

## 2021-12-05 DIAGNOSIS — Z7901 Long term (current) use of anticoagulants: Secondary | ICD-10-CM | POA: Diagnosis not present

## 2021-12-05 DIAGNOSIS — I4819 Other persistent atrial fibrillation: Secondary | ICD-10-CM | POA: Diagnosis not present

## 2021-12-05 DIAGNOSIS — I4892 Unspecified atrial flutter: Secondary | ICD-10-CM | POA: Diagnosis not present

## 2021-12-05 DIAGNOSIS — E785 Hyperlipidemia, unspecified: Secondary | ICD-10-CM | POA: Diagnosis not present

## 2021-12-05 DIAGNOSIS — I110 Hypertensive heart disease with heart failure: Secondary | ICD-10-CM | POA: Diagnosis not present

## 2021-12-05 DIAGNOSIS — I480 Paroxysmal atrial fibrillation: Secondary | ICD-10-CM | POA: Diagnosis not present

## 2021-12-05 DIAGNOSIS — I509 Heart failure, unspecified: Secondary | ICD-10-CM | POA: Diagnosis not present

## 2021-12-05 DIAGNOSIS — Z950 Presence of cardiac pacemaker: Secondary | ICD-10-CM | POA: Diagnosis not present

## 2021-12-05 DIAGNOSIS — D689 Coagulation defect, unspecified: Secondary | ICD-10-CM | POA: Diagnosis not present

## 2021-12-05 DIAGNOSIS — Z951 Presence of aortocoronary bypass graft: Secondary | ICD-10-CM | POA: Diagnosis not present

## 2021-12-05 DIAGNOSIS — I5032 Chronic diastolic (congestive) heart failure: Secondary | ICD-10-CM | POA: Diagnosis not present

## 2021-12-05 DIAGNOSIS — I2510 Atherosclerotic heart disease of native coronary artery without angina pectoris: Secondary | ICD-10-CM | POA: Diagnosis not present

## 2021-12-05 DIAGNOSIS — E669 Obesity, unspecified: Secondary | ICD-10-CM | POA: Diagnosis not present

## 2021-12-05 DIAGNOSIS — I4891 Unspecified atrial fibrillation: Secondary | ICD-10-CM | POA: Diagnosis not present

## 2021-12-05 DIAGNOSIS — G4733 Obstructive sleep apnea (adult) (pediatric): Secondary | ICD-10-CM | POA: Diagnosis not present

## 2021-12-05 DIAGNOSIS — Z8673 Personal history of transient ischemic attack (TIA), and cerebral infarction without residual deficits: Secondary | ICD-10-CM | POA: Diagnosis not present

## 2021-12-05 DIAGNOSIS — Z91018 Allergy to other foods: Secondary | ICD-10-CM | POA: Diagnosis not present

## 2021-12-05 DIAGNOSIS — Z6829 Body mass index (BMI) 29.0-29.9, adult: Secondary | ICD-10-CM | POA: Diagnosis not present

## 2021-12-05 DIAGNOSIS — Z79899 Other long term (current) drug therapy: Secondary | ICD-10-CM | POA: Diagnosis not present

## 2021-12-05 DIAGNOSIS — Z91048 Other nonmedicinal substance allergy status: Secondary | ICD-10-CM | POA: Diagnosis not present

## 2021-12-05 DIAGNOSIS — I255 Ischemic cardiomyopathy: Secondary | ICD-10-CM | POA: Diagnosis not present

## 2021-12-05 DIAGNOSIS — I471 Supraventricular tachycardia: Secondary | ICD-10-CM | POA: Diagnosis not present

## 2021-12-05 DIAGNOSIS — Z881 Allergy status to other antibiotic agents status: Secondary | ICD-10-CM | POA: Diagnosis not present

## 2021-12-05 DIAGNOSIS — D649 Anemia, unspecified: Secondary | ICD-10-CM | POA: Diagnosis not present

## 2021-12-05 DIAGNOSIS — Z9851 Tubal ligation status: Secondary | ICD-10-CM | POA: Diagnosis not present

## 2021-12-05 DIAGNOSIS — Z20822 Contact with and (suspected) exposure to covid-19: Secondary | ICD-10-CM | POA: Diagnosis not present

## 2021-12-05 DIAGNOSIS — I248 Other forms of acute ischemic heart disease: Secondary | ICD-10-CM | POA: Diagnosis not present

## 2021-12-05 DIAGNOSIS — Z91040 Latex allergy status: Secondary | ICD-10-CM | POA: Diagnosis not present

## 2021-12-05 LAB — FERRITIN: Ferritin: 20 ng/mL (ref 11–307)

## 2021-12-05 LAB — BASIC METABOLIC PANEL
Anion gap: 10 (ref 5–15)
BUN: 10 mg/dL (ref 6–20)
CO2: 24 mmol/L (ref 22–32)
Calcium: 8.7 mg/dL — ABNORMAL LOW (ref 8.9–10.3)
Chloride: 105 mmol/L (ref 98–111)
Creatinine, Ser: 1.02 mg/dL — ABNORMAL HIGH (ref 0.44–1.00)
GFR, Estimated: >60 mL/min (ref >60)
Glucose, Bld: 117 mg/dL — ABNORMAL HIGH (ref 70–99)
Potassium: 3.5 mmol/L (ref 3.5–5.1)
Sodium: 139 mmol/L (ref 135–145)

## 2021-12-05 LAB — IRON AND TIBC
Iron: 20 ug/dL — ABNORMAL LOW (ref 28–170)
Saturation Ratios: 5 % — ABNORMAL LOW (ref 10.4–31.8)
TIBC: 391 ug/dL (ref 250–450)
UIBC: 371 ug/dL

## 2021-12-05 LAB — CBC
HCT: 33.1 % — ABNORMAL LOW (ref 36.0–46.0)
Hemoglobin: 10.1 g/dL — ABNORMAL LOW (ref 12.0–15.0)
MCH: 28 pg (ref 26.0–34.0)
MCHC: 30.5 g/dL (ref 30.0–36.0)
MCV: 91.7 fL (ref 80.0–100.0)
Platelets: 317 10*3/uL (ref 150–400)
RBC: 3.61 MIL/uL — ABNORMAL LOW (ref 3.87–5.11)
RDW: 16 % — ABNORMAL HIGH (ref 11.5–15.5)
WBC: 6.5 10*3/uL (ref 4.0–10.5)
nRBC: 0 % (ref 0.0–0.2)

## 2021-12-05 LAB — TROPONIN I (HIGH SENSITIVITY): Troponin I (High Sensitivity): 24 ng/L — ABNORMAL HIGH (ref <18)

## 2021-12-05 MED ORDER — FUROSEMIDE 10 MG/ML IJ SOLN
40.0000 mg | Freq: Once | INTRAMUSCULAR | Status: AC
  Administered 2021-12-05: 40 mg via INTRAVENOUS
  Filled 2021-12-05: qty 4

## 2021-12-05 MED ORDER — ACETAMINOPHEN 325 MG PO TABS: 650.0000 mg | ORAL_TABLET | ORAL | Status: DC | PRN

## 2021-12-05 MED ORDER — PROPOFOL 10 MG/ML IV BOLUS
INTRAVENOUS | Status: AC
  Filled 2021-12-05: qty 20

## 2021-12-05 MED ORDER — AMIODARONE HCL IN DEXTROSE 360-4.14 MG/200ML-% IV SOLN
60.0000 mg/h | INTRAVENOUS | Status: AC
Start: 2021-12-05 — End: 2021-12-05
  Administered 2021-12-05: 60 mg/h via INTRAVENOUS
  Filled 2021-12-05: qty 200

## 2021-12-05 MED ORDER — APIXABAN 5 MG PO TABS
5.0000 mg | ORAL_TABLET | Freq: Two times a day (BID) | ORAL | Status: DC
  Administered 2021-12-05 – 2021-12-06 (×3): 5 mg via ORAL
  Filled 2021-12-05 (×4): qty 1

## 2021-12-05 MED ORDER — FAMOTIDINE 20 MG PO TABS: 20.0000 mg | ORAL_TABLET | Freq: Every day | ORAL | Status: DC | PRN

## 2021-12-05 MED ORDER — PROPOFOL 10 MG/ML IV BOLUS
INTRAVENOUS | Status: AC | PRN
Start: 2021-12-05 — End: 2021-12-05
  Administered 2021-12-05 (×2): 40 mg via INTRAVENOUS

## 2021-12-05 MED ORDER — DIPHENHYDRAMINE HCL 25 MG PO CAPS: 25.0000 mg | ORAL_CAPSULE | Freq: Four times a day (QID) | ORAL | Status: DC | PRN

## 2021-12-05 MED ORDER — ONDANSETRON HCL 4 MG/2ML IJ SOLN: 4.0000 mg | Freq: Four times a day (QID) | INTRAMUSCULAR | Status: DC | PRN

## 2021-12-05 MED ORDER — AMIODARONE HCL IN DEXTROSE 360-4.14 MG/200ML-% IV SOLN
30.0000 mg/h | INTRAVENOUS | Status: DC
  Administered 2021-12-05: 30 mg/h via INTRAVENOUS
  Filled 2021-12-05: qty 200

## 2021-12-05 MED ORDER — AMIODARONE LOAD VIA INFUSION
150.0000 mg | Freq: Once | INTRAVENOUS | Status: AC
  Administered 2021-12-05: 150 mg via INTRAVENOUS
  Filled 2021-12-05: qty 83.34

## 2021-12-05 MED ORDER — ATORVASTATIN CALCIUM 80 MG PO TABS
80.0000 mg | ORAL_TABLET | Freq: Every evening | ORAL | Status: DC
  Administered 2021-12-05: 80 mg via ORAL
  Filled 2021-12-05: qty 1

## 2021-12-05 NOTE — Sedation Documentation (Signed)
Pt converted to NSR with AV pacing  ?

## 2021-12-05 NOTE — Progress Notes (Signed)
? ?Progress Note ? ?Patient Name: Ashley [name] ?Date of Encounter: 12/05/2021 ? ?Primary Cardiologist: Dorris Carnes, MD  ? ? ? ?Patient Profile  ?   ?56 y.o. female with recurrent atrial fib/flutter,SND with prev pacemaker,CABG admitted  4/15 with CHF assoc with volume overload and elevated HR ? ?Seen inoffice 3/23 with higher burden of Atach and amio increased.  Not sure candidate for ablation given atriopathy ( >50 ml/m2) and MV disease  ? ?Short of breath and edematous for the last couple weeks ? ?Subjective  ? ?Feeling much better following diuresis last night ? ?Inpatient Medications  ?  ?Scheduled Meds: ? apixaban  5 mg Oral BID  ? incobotulinumtoxinA  50 Units Intramuscular Q90 days  ? incobotulinumtoxinA  50 Units Intramuscular Q90 days  ? incobotulinumtoxinA  50 Units Intramuscular Q90 days  ? ?Continuous Infusions: ? amiodarone 30 mg/hr (12/05/21 0729)  ? ?PRN Meds: ?acetaminophen, ondansetron (ZOFRAN) IV  ? ?Vital Signs  ?  ?Vitals:  ? 12/05/21 0330 12/05/21 0430 12/05/21 0700 12/05/21 0730  ?BP: 118/79 108/72 106/88 111/61  ?Pulse: (!) 127 (!) 128 (!) 114 (!) 102  ?Resp: (!) 22 (!) 21 17 (!) 21  ?Temp:      ?TempSrc:      ?SpO2: 100% 99% 98% 96%  ? ? ?Intake/Output Summary (Last 24 hours) at 12/05/2021 0756 ?Last data filed at 12/05/2021 0720 ?Gross per 24 hour  ?Intake 202.25 ml  ?Output --  ?Net 202.25 ml  ? ?There were no vitals filed for this visit. ? ?Telemetry  ?  ?Atrial tachycardia with variable conduction mostly one-to-one- Personally Reviewed ? ?ECG  ?  ?Atrial tachycardia- Personally Reviewed ? ?Physical Exam  ?  ?GEN: No acute distress.   ?Neck: No JVD ?Cardiac: Rapid RR, no murmurs, rubs, or gallops.  ?Respiratory: Bibasilar crackles y. ?GI: Soft, nontender, non-distended  ?MS: Trace edema; No deformity. ?Neuro:  Nonfocal  ?Psych: Normal affect  ? ?Labs  ?  ?Chemistry ?Recent Labs  ?Lab 12/04/21 ?7782 12/04/21 ?2141 12/05/21 ?4235  ?NA 139  --  139  ?K 3.9  --  3.5  ?CL 103  --  105   ?CO2 24  --  24  ?GLUCOSE 111*  --  117*  ?BUN 10  --  10  ?CREATININE 1.00  --  1.02*  ?CALCIUM 9.0  --  8.7*  ?PROT  --  7.3  --   ?ALBUMIN  --  3.6  --   ?AST  --  26  --   ?ALT  --  23  --   ?ALKPHOS  --  120  --   ?BILITOT  --  1.1  --   ?GFRNONAA >60  --  >60  ?ANIONGAP 12  --  10  ?  ? ?Hematology ?Recent Labs  ?Lab 12/04/21 ?3614 12/05/21 ?4315  ?WBC 6.2 6.5  ?RBC 3.71* 3.61*  ?HGB 10.2* 10.1*  ?HCT 34.4* 33.1*  ?MCV 92.7 91.7  ?MCH 27.5 28.0  ?MCHC 29.7* 30.5  ?RDW 16.0* 16.0*  ?PLT 349 317  ? ? ?Cardiac EnzymesNo results for input(s): TROPONINI in the last 168 hours. No results for input(s): TROPIPOC in the last 168 hours.  ? ?BNP ?Recent Labs  ?Lab 12/04/21 ?2200  ?BNP 1,336.9*  ?  ? ?DDimer No results for input(s): DDIMER in the last 168 hours.  ? ?Radiology  ?  ?DG Chest 2 View ? ?Result Date: 12/04/2021 ?CLINICAL DATA:  Shortness of breath. EXAM: CHEST - 2 VIEW COMPARISON:  June 26, 2019 FINDINGS: There is a dual lead AICD. Multiple sternal wires and vascular clips are seen. Stable, chronic appearing increased lung markings are noted without evidence of focal consolidation, pleural effusion or pneumothorax. The cardiac silhouette is mildly enlarged and unchanged in size. There is moderate severity calcification of the aortic arch. The visualized skeletal structures are unremarkable. IMPRESSION: 1. Evidence of prior median sternotomy/CABG. 2. Stable cardiomegaly with chronic appearing increased lung markings. 3. No acute or active cardiopulmonary disease. Electronically Signed   By: Virgina Norfolk M.D.   On: 12/04/2021 20:52   ? ?Cardiac Studies  ? ?  ?Assessment & Plan  ?  ?Atrial tachycardia ? ?HFpEF ? ?Amio ? ?Anemia new and progressive  ? ? ?Repeat echo to assess LV function, mitral valve and atrium ?Continue AMIO for now--tried pace termination with acceleration to atrial flutter at a cycle length of about 260 ms   there may have been another flutter at about 300 ms ?Will review with  colleagues about possible atrial ablation--vs AVN  given pacing, I suspect that the latter is going to be the preferred strategy ? ?Check iron labs with the above anemia, this could be aggravating ?We will do stool guaiac. ? ?Continue lasix we will repeat ? ?Anticipate cardioversion this morning for further outpatient work-up ? ? ? ?For questions or updates, please contact [place] ?Please consult [email or web address] for contact info under Cardiology/STEMI. ?  ?   ?Signed, ?Virl Axe, MD  ?12/05/2021, 7:56 AM   ? ?

## 2021-12-05 NOTE — H&P (Signed)
?Cardiology Admission History and Physical:  ? ?Patient ID: Ashley [name] ?MRN: [id number]; DOB: 02-27-1966  ? ?Admission date: 12/04/2021 ? ?PCP:  [name], MD ?  ?[facility] Providers ?Cardiologist:  Dorris Carnes, MD  ?Electrophysiologist:  Virl Axe, MD     ? ? ?Chief Complaint: SOB ? ?Patient Profile:  ? ?Ashley [name] is a 56 y.o. female with pmh sx for hypertension, ischemic cardiomyopathy with mildly reduced EF ( most recent EF of 50 to 55%), recurrent A-fib/flutter status post pacemaker who continues to go in and out of a tachycardic rhythm and is currently on amiodarone, mitral valve prolapse status postrepair, CABG x4 vessels, who is being seen 12/05/2021 for the evaluation of SOB/volume overload and elevated HR.  ? ?History of Present Illness:  ? ?[name] is a 56 y.o. female with pmh sx for hypertension, ischemic cardiomyopathy with mildly reduced EF ( most recent EF of 50 to 55%), recurrent A-fib/flutter status post pacemaker who continues to go in and out of a tachycardic rhythm and is currently on amiodarone, mitral valve prolapse status postrepair, CABG x4 vessels, who is being seen 12/05/2021 for the evaluation of SOB/volume overload and elevated HR. She has been following in cardiology clinic and taking PO amiodarone but she says her HR have been in 130 persistently for months despite that. What made her concern was that during the past week, she started gaining weight, had leg edema and difficulty in breathing hence came to the ED. She denies any significant cough, congestion or fever.  She has no chest pain or abdominal pain.  She denies any vomiting or diarrhea. She now takes apixaban as her insuance changed- has been compliant with it. In the ED, her BNP was 1200s; troponin 21; EKG showed rapid atrial tachycardia in 140s. Cardiology was consulted for admission.  ? ? ?Past Medical History:  ?Diagnosis Date  ? Abdominal pain   ? ? chronic cholecystitis; admxn to [facility]  9/12 (CT, USN, HIDA done)  ? Atrial fibrillation ([place])   ? Cardiomyopathy   ? echo 9/12: mild LVH, EF 35-40%, mod MR (difficult to judge /post leaflet restricted), mod BAE, mod RVE, mod TR   ? Cardiomyopathy ([place])   ? a. echo 9/12: mild LVH, EF 35-40%, mod MR (difficult to judge /post leaflet restricted), mod BAE, mod RVE, mod TR. b. Echo 11/2011: mild LVH, EF 55-60%, s/p MV repair w/o sig MR/MS, mildly dilated RV/RA, PASP 71mHg. // AFL dx 10/2018 - Echo 10/2018: EF 30, diff HK, s/p MV repair with trivial MR, mean MV 7 but not c/w significant mitral stenosis, mild RAE, severe LAE, mod reduced RVSF   ? Carotid artery disease ([place]   ? a. Duplex 06/2015: 50% RECA, 1-39% BICA.  ? CHF (congestive heart failure) ([place]   ? Clotting disorder ([place]   ? Coronary artery disease   ? a. s/p CABG 10/2011 - LIMA-LAD, SVG-PDA, SVG-OM, SVG-diagonal.  ? Dyslipidemia   ? Essential hypertension   ? Heart murmur   ? Hemifacial spasm   ? History of Doppler ultrasound   ? a. [facility] [place] [place]: no hemodynamically significant ICA stenosis  ? Hyperlipidemia   ? Mitral regurgitation   ? a. s/p MV repair 10/2011 - on Coumadin for 3 months afterwards then d/c'd by surgery.  ? Recurrent upper respiratory infection (URI)   ? 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 S/P mitral valve repair 10/21/2011  ? Stroke ([facility] 1989  ?  a. age 60 - in setting of cigs and OCPs. //  b. s/p acute L parietal CVA >> tPA - [facility] in [place], [name]  ? Stroke [facility]) 01/2017  ? Tobacco abuse   ? ? ?Past Surgical History:  ?Procedure Laterality Date  ? CARDIAC CATHETERIZATION    ? CARDIOVERSION N/A 01/25/2019  ? Procedure: CARDIOVERSION;  Surgeon: [name], MD;  Location: [place];  Service: Cardiovascular;  Laterality: N/A;  ? CARDIOVERSION N/A 06/14/2019  ? Procedure: CARDIOVERSION;  Surgeon: [name], MD;  Location: [place];  Service: Cardiovascular;  Laterality: N/A;  ? [place]  ? twins  ?  CORONARY ARTERY BYPASS GRAFT  10/21/2011  ? Procedure: CORONARY ARTERY BYPASS GRAFTING (CABG);  Surgeon: [name], MD;  Location: [place];  Service: Open Heart Surgery;  Laterality: N/A;  cabg x four, using right leg greater saphenous vein harvested endoscopically  ? DENTAL SURGERY    ? MITRAL VALVE REPAIR  10/21/2011  ? Procedure: MITRAL VALVE REPAIR (MVR);  Surgeon: [name], MD;  Location: [place];  Service: Open Heart Surgery;  Laterality: N/A;  ? PACEMAKER IMPLANT N/A 06/25/2019  ? Procedure: PACEMAKER IMPLANT;  Surgeon: [name], MD;  Location: [place] CV LAB;  Service: Cardiovascular;  Laterality: N/A;  ? TEE WITHOUT CARDIOVERSION  06/30/2011  ? Procedure: TRANSESOPHAGEAL ECHOCARDIOGRAM (TEE);  Surgeon: [name], MD;  Location: [facility] ENDOSCOPY;  Service: Cardiovascular;  Laterality: N/A;  ? TONSILLECTOMY    ? TUBAL LIGATION    ?  ? ?Medications Prior to Admission: ?Prior to Admission medications   ?Medication Sig Start Date End Date Taking? Authorizing Provider  ?acetaminophen (TYLENOL) 500 MG tablet Take 500-1,000 mg by mouth every 6 (six) hours as needed for headache.   Yes [provider]  ?amiodarone (PACERONE) 200 MG tablet TAKE ONE TABLET BY MOUTH ONE TIME DAILY ?Patient taking differently: Take 200 mg by mouth daily. 09/08/21  Yes [name], MD  ?amLODipine (NORVASC) 10 MG tablet Take 1 tablet (10 mg total) by mouth daily. 01/15/21  Yes [name], MD  ?apixaban (ELIQUIS) 5 MG TABS tablet Take 1 tablet (5 mg total) by mouth 2 (two) times daily. 11/01/21  Yes [name], MD  ?atorvastatin (LIPITOR) 80 MG tablet Take 1 tablet (80 mg total) by mouth daily. ?Patient taking differently: Take 80 mg by mouth every evening. 01/22/21  Yes [name], MD  ?diphenhydrAMINE (BENADRYL) 25 MG tablet Take 25 mg by mouth every 6 (six) hours as needed for allergies.   Yes [provider]  ?famotidine (PEPCID) 20 MG tablet Take 20 mg by mouth daily as needed for  indigestion.    Yes [provider]  ?furosemide (LASIX) 20 MG tablet TAKE 1 TABLET BY MOUTH AS NEEDED FOR  SWELLING. ?Patient taking differently: Take 20 mg by mouth daily as needed for fluid or edema. 03/16/21  Yes [name], MD  ?losartan (COZAAR) 50 MG tablet Take 1.5 tablets (75 mg total) by mouth daily. 11/01/21  Yes [name], MD  ?Saline 0.2 % SOLN Place 1 spray into both nostrils as needed (for congestion).    Yes [provider]  ?vitamin B-12 (CYANOCOBALAMIN) 1000 MCG tablet Take 1 tablet (1,000 mcg total) by mouth daily. 06/21/16  Yes Rosalin Hawking, MD  ?vitamin C (ASCORBIC ACID) 500 MG tablet Take 1,000 mg by mouth 2 (two) times daily.    Yes [provider]  ?amiodarone (PACERONE) 200 MG tablet Restart Amiodarone '400mg'$  - 2 tablet by mouth twice daily x 2 weeks then reduce to '200mg'$  - 1 tablet by mouth twice daily x 2 weeks then return to you normal dosing of '200mg'$  - 1 tablet by mouth daily ?Patient not taking: Reported on 12/04/2021 11/01/21   [name], MD  ?  ? ?Allergies:    ?Allergies  ?Allergen Reactions  ? Azithromycin Nausea Only  ? Latex Rash  ? Spinach Hives  ? Tape Other (See Comments)  ?  PLEASE USE PAPER TAPE!! AFFECTS SKIN BADLY!!!!  ? ? ?Social History:   ?Social History  ? ?Socioeconomic History  ? Marital status: Married  ?  Spouse name: Not on file  ? Number of children: 16  ? Years of education: 2  ? Highest education level: Not on file  ?Occupational History  ? Occupation: Payroll/Paperwork  ?  Comment: 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  ?Tobacco Use  ? Smoking status: Former  ?  Packs/day: 1.00  ?  Years: 30.00  ?  Pack years: 30.00  ?  Types: Cigarettes  ?  Quit date: 05/07/2011  ?  Years since quitting: 10.5  ? Smokeless tobacco: Never  ?Vaping Use  ? Vaping Use: Never used  ?Substance and Sexual Activity  ? Alcohol use: Yes  ?  Alcohol/week: 1.0 standard drink  ?  Types: 1 Glasses of wine per week  ?  Comment: 1 glass of red wine nightly  ?  Drug use: No  ? Sexual activity: Yes  ?Other Topics Concern  ? Not on file  ?Social History Narrative  ? Lives at home with husband and son.  ? Right-handed.  ? 2 cups caffeine daily.  ? ?Social Determinants of H

## 2021-12-05 NOTE — Sedation Documentation (Signed)
Medication dose calculated and verified for: [employer], approxmately 90 kg ? ?

## 2021-12-05 NOTE — Sedation Documentation (Signed)
Pt synchronized cardioversion with 200 J ?

## 2021-12-05 NOTE — TOC Initial Note (Signed)
Transition of Care (TOC) - Initial/Assessment Note  ? ? ?Patient Details  ?Name: Ashley [name] ?MRN: [id number] ?Date of Birth: [birth year] ? ?Transition of Care (TOC) CM/SW Contact:    ?Verdell Carmine, RN ?Phone Number: ?12/05/2021, 10:27 AM ? ?Clinical Narrative:                 ? ?Transition of Care Department [facility]) has reviewed patient and no TOC needs have been identified at this time. We will continue to monitor patient advancement through interdisciplinary progression rounds. If new patient transition needs arise, please place a TOC consult. ?  ?  ?  ?  ? ? ?Patient Goals and CMS Choice ?  ?  ?  ? ?Expected Discharge Plan and Services ?  ?  ?  ?  ?  ?                ?  ?  ?  ?  ?  ?  ?  ?  ?  ?  ? ?Prior Living Arrangements/Services ?  ?  ?  ?       ?  ?  ?  ?  ? ?Activities of Daily Living ?  ?  ? ?Permission Sought/Granted ?  ?  ?   ?   ?   ?   ? ?Emotional Assessment ?  ?  ?  ?  ?  ?  ? ?Admission diagnosis:  Atrial fibrillation ([place]) [I48.91] ?Patient Active Problem List  ? Diagnosis Date Noted  ? Pacemaker 10/08/2019  ? Acquired thrombophilia ([place]) 07/12/2019  ? Sick sinus syndrome ([place]) 06/25/2019  ? A-fib ([place]) 06/14/2019  ? Postprocedural cardiogenic shock, initial encounter ([place]) 06/14/2019  ? Chronic combined systolic and diastolic heart failure ([place]) 06/05/2019  ? Atrial fibrillation ([place]) 03/22/2019  ? Encounter for therapeutic drug monitoring 03/22/2019  ? Persistent atrial fibrillation ([place])   ? History of stroke 01/01/2018  ? Former smoker 07/03/2017  ? B12 deficiency 06/21/2016  ? Hemifacial spasm 04/13/2016  ? Cerebrovascular accident (CVA) due to thrombosis of cerebral artery ([place])   ? Cardiomyopathy ([place])   ? Essential hypertension   ? S/P mitral valve repair 10/21/2011  ? 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 Coronary artery disease 07/11/2011  ? CAD (coronary artery disease) 06/21/2011  ? Dyslipidemia 06/21/2011  ? Mitral regurgitation 06/21/2011  ? ?PCP:  [name], MD ?Pharmacy:    ?[facility] [address] [place], [place]. AT [address] ?[place] [place] [address] ?Phone: [phone] Fax: [phone] ? ? ? ? ?Social Determinants of Health (SDOH) Interventions ?  ? ?Readmission Risk Interventions ?   ? View : No data to display.  ?  ?  ?  ? ? ? ?

## 2021-12-05 NOTE — ED Provider Notes (Signed)
1:09 PM ?Patient being admitted to our cardiology colleagues.  At bedside I discussed her admission with our colleague Dr. Caryl Comes.  Indications for cardioversion given persistent arrhythmia.  We reviewed the patient's pacemaker rhythm via telemetry device, and the patient consented for cardioversion.  This was performed by myself and Dr. Caryl Comes, without complication. ? ?.Sedation ? ?Date/Time: 12/05/2021 12:00 PM ?Performed by: Carmin Muskrat, MD ?Authorized by: Carmin Muskrat, MD  ? ?Consent:  ?  Consent obtained:  Verbal ?  Consent given by:  Patient ?  Risks discussed:  Dysrhythmia, inadequate sedation, respiratory compromise necessitating ventilatory assistance and intubation, nausea and vomiting ?  Alternatives discussed:  Anxiolysis ?Universal protocol:  ?  Procedure explained and questions answered to patient or proxy's satisfaction: yes   ?  Relevant documents present and verified: yes   ?  Test results available: yes   ?  Imaging studies available: yes   ?  Required blood products, implants, devices, and special equipment available: yes   ?  Site/side marked: yes   ?  Immediately prior to procedure, a time out was called: yes   ?  Patient identity confirmed:  Verbally with patient ?Indications:  ?  Procedure performed:  Cardioversion ?  Procedure necessitating sedation performed by:  Different physician (Dr. Caryl Comes) ?Pre-sedation assessment:  ?  Time since last food or drink:  12 ?  ASA classification: class 2 - patient with mild systemic disease   ?  Mouth opening:  2 finger widths ?  Thyromental distance:  2 finger widths ?  Mallampati score:  III - soft palate, base of uvula visible ?  Neck mobility: reduced   ?  Pre-sedation assessments completed and reviewed: airway patency, cardiovascular function, hydration status, mental status, nausea/vomiting, pain level, respiratory function and temperature   ?  Pre-sedation assessment completed:  12/05/2021 12:00 PM ?Immediate pre-procedure details:  ?   Reassessment: Patient reassessed immediately prior to procedure   ?  Reviewed: vital signs and relevant labs/tests   ?  Verified: bag valve mask available, emergency equipment available, intubation equipment available, IV patency confirmed, oxygen available, reversal medications available and suction available   ?Procedure details (see MAR for exact dosages):  ?  Preoxygenation:  Nasal cannula ?  Sedation:  Propofol ?  Intended level of sedation: deep ?  Intra-procedure monitoring:  Blood pressure monitoring, cardiac monitor, continuous pulse oximetry, continuous capnometry, frequent vital sign checks and frequent LOC assessments ?  Intra-procedure events: none   ?  Total Provider sedation time (minutes):  20 ?Post-procedure details:  ?  Post-sedation assessment completed:  12/05/2021 1:00 PM ?  Attendance: Constant attendance by certified staff until patient recovered   ?  Recovery: Patient returned to pre-procedure baseline   ?  Post-sedation assessments completed and reviewed: airway patency, cardiovascular function, hydration status, mental status, nausea/vomiting, pain level, respiratory function and temperature   ?  Patient is stable for discharge or admission: yes   ?  Procedure completion:  Tolerated well, no immediate complications ? ?  ?Carmin Muskrat, MD ?12/05/21 1312 ? ?

## 2021-12-05 NOTE — CV Procedure (Signed)
Preop Dx atrial flutter ?Post op DX  NSR  ? ?Procedure  DC Cardioversion ? ? ?Pt was sedated by anesthesia receiving 60 mg Propafol ? ?A synchronized shock 200 joules restored sinus Rhythm ? ?Pt tolerated without difficulty ? ?

## 2021-12-06 ENCOUNTER — Inpatient Hospital Stay (HOSPITAL_COMMUNITY): Payer: Medicaid Other

## 2021-12-06 DIAGNOSIS — I4891 Unspecified atrial fibrillation: Secondary | ICD-10-CM

## 2021-12-06 DIAGNOSIS — I248 Other forms of acute ischemic heart disease: Secondary | ICD-10-CM | POA: Diagnosis not present

## 2021-12-06 LAB — ECHOCARDIOGRAM COMPLETE
Area-P 1/2: 2.32 cm2
Calc EF: 45.1 %
Height: 68 in
MV M vel: 5.4 m/s
MV Peak grad: 116.6 mmHg
MV VTI: 1.08 cm2
P 1/2 time: 738 msec
Radius: 0.4 cm
S' Lateral: 4.8 cm
Single Plane A2C EF: 42.7 %
Single Plane A4C EF: 43.8 %
Weight: 3316.8 oz

## 2021-12-06 MED ORDER — AMIODARONE HCL 200 MG PO TABS: 200.0000 mg | ORAL_TABLET | Freq: Two times a day (BID) | ORAL | 6 refills | Status: DC

## 2021-12-06 MED ORDER — FERROUS SULFATE 325 (65 FE) MG PO TABS: 325.0000 mg | ORAL_TABLET | Freq: Every day | ORAL | 3 refills | Status: DC

## 2021-12-06 MED ORDER — FERROUS SULFATE 325 (65 FE) MG PO TABS: 325.0000 mg | ORAL_TABLET | Freq: Every day | ORAL | Status: DC

## 2021-12-06 MED ORDER — AMIODARONE HCL 200 MG PO TABS
200.0000 mg | ORAL_TABLET | Freq: Two times a day (BID) | ORAL | Status: DC
  Administered 2021-12-06: 200 mg via ORAL
  Filled 2021-12-06: qty 1

## 2021-12-06 MED ORDER — SODIUM CHLORIDE 0.9 % IV SOLN
510.0000 mg | Freq: Once | INTRAVENOUS | Status: AC
  Administered 2021-12-06: 510 mg via INTRAVENOUS
  Filled 2021-12-06: qty 17

## 2021-12-06 NOTE — Progress Notes (Signed)
Pt with no complaints this am.  ? ?Note O2 desat overnight. Plan to ambulated and assess O2.  She will at least need sleep study.  ? ?Discharge pending echo.  ? ?Full note pending disposition.  ? ?Lollie Marrow, PA-C  ?12/06/2021 8:56 AM  ?

## 2021-12-06 NOTE — Discharge Summary (Signed)
? ? ?ELECTROPHYSIOLOGY DISCHARGE SUMMARY  ? ? ?Patient ID: Ashley [name],  ?MRN: [id number], DOB/AGE: February 28, 1966 56 y.o. ? ?Admit date: 12/04/2021 ?Discharge date: 12/06/2021 ? ?Primary Care Physician: [name], MD  ?Primary Cardiologist: Dorris Carnes, MD  ?Electrophysiologist: Dr. Caryl Comes ? ?Primary Discharge Diagnosis:  ?Atrial tachycardia ? ?Secondary Discharge Diagnosis:  ?HFpEF ?Amiodarone use ?Anemia ? ? ?Procedures This Admission:  ?DC cardioversion 12/05/2021 by Dr. Caryl Comes ? ?Echocardiogram 4/17 ? ?Brief HPI: ?Ashley [name] is a 56 y.o. female with a history of recurrent fib flutter, SND s/p PPM, and h/o CABG  admitted for volume overload and RVR.  ? ?Hospital Course:  ?The patient was admitted as above. Had previously been seen in office 3/23 with higher burden of AT and amio increased. Felt not ideal candidate for ablation.  They were monitored on telemetry overnight which demonstrated atrial tachycardia with rapid rates. Attempts to pace out were unsuccessful -> accelerated to flutter. Underwent DCC and maintained NSR up to discharge. IDA noted with down-trending Hgb. Iron infused and started as po for outpatient. Will need close PCP follow up. The patient was examined and considered to be stable for discharge.  Wound care and restrictions were reviewed with the patient.  The patient will be seen back by  EP APP in 2 weeks for post hospital care. Dr. Caryl Comes to discuss possibility of atrial ablation vs AV nodal ablation and pacing with EP team.  ? ?Pt also noted to have O2 desat overnight. Ambulated which showed she maintains sats > 95% with ambulation.  Likely OSA. Will arrange sleep study as outpatient.  ? ?Home on amiodarone 200 mg BID and will titrate down at follow up. ? ?Physical Exam: ?Vitals:  ? 12/06/21 0619 12/06/21 0726 12/06/21 1226 12/06/21 1233  ?BP: (!) 105/58  128/65 (!) 149/72  ?Pulse: 73 79 66 67  ?Resp: 18 18 (!) 24 (!) 23  ?Temp: 98.6 ?F (37 ?C) 98.8 ?F (37.1 ?C)     ?TempSrc: Oral Oral    ?SpO2: 96% 95% 94% 94%  ?Weight:      ?Height:      ? ? ?GEN- The patient is well appearing, alert and oriented x 3 today.   ?HEENT: normocephalic, atraumatic; sclera clear, conjunctiva pink; hearing intact; oropharynx clear; neck supple  ?Lungs- Clear to ausculation bilaterally, normal work of breathing.  No wheezes, rales, rhonchi ?Heart- Regular rate and rhythm, no murmurs, rubs or gallops  ?GI- soft, non-tender, non-distended, bowel sounds present  ?Extremities- no clubbing, cyanosis, or edema; DP/PT/radial pulses 2+ bilaterally, groin without hematoma/bruit ?MS- no significant deformity or atrophy ?Skin- warm and dry, no rash or lesion ?Psych- euthymic mood, full affect ?Neuro- strength and sensation are intact ? ? ?Labs: ?  ?Lab Results  ?Component Value Date  ? WBC 6.5 12/05/2021  ? HGB 10.1 (L) 12/05/2021  ? HCT 33.1 (L) 12/05/2021  ? MCV 91.7 12/05/2021  ? PLT 317 12/05/2021  ?  ?Recent Labs  ?Lab 12/04/21 ?2141 12/05/21 ?0412  ?NA  --  139  ?K  --  3.5  ?CL  --  105  ?CO2  --  24  ?BUN  --  10  ?CREATININE  --  1.02*  ?CALCIUM  --  8.7*  ?PROT 7.3  --   ?BILITOT 1.1  --   ?ALKPHOS 120  --   ?ALT 23  --   ?AST 26  --   ?GLUCOSE  --  117*  ? ? ? ?Discharge Medications:  ?Allergies as  of 12/06/2021   ? ?   Reactions  ? Azithromycin Nausea Only  ? Latex Rash  ? Spinach Hives  ? Tape Other (See Comments)  ? PLEASE USE PAPER TAPE!! AFFECTS SKIN BADLY!!!!  ? ?  ? ?  ?Medication List  ?  ? ?TAKE these medications   ? ?acetaminophen 500 MG tablet ?Commonly known as: TYLENOL ?Take 500-1,000 mg by mouth every 6 (six) hours as needed for headache. ?  ?amiodarone 200 MG tablet ?Commonly known as: PACERONE ?Take 1 tablet (200 mg total) by mouth 2 (two) times daily. ?What changed:  ?when to take this ?Another medication with the same name was removed. Continue taking this medication, and follow the directions you see here. ?  ?amLODipine 10 MG tablet ?Commonly known as: NORVASC ?Take 1 tablet (10  mg total) by mouth daily. ?  ?apixaban 5 MG Tabs tablet ?Commonly known as: ELIQUIS ?Take 1 tablet (5 mg total) by mouth 2 (two) times daily. ?  ?atorvastatin 80 MG tablet ?Commonly known as: LIPITOR ?Take 1 tablet (80 mg total) by mouth daily. ?What changed: when to take this ?  ?diphenhydrAMINE 25 MG tablet ?Commonly known as: BENADRYL ?Take 25 mg by mouth every 6 (six) hours as needed for allergies. ?  ?famotidine 20 MG tablet ?Commonly known as: PEPCID ?Take 20 mg by mouth daily as needed for indigestion. ?  ?ferrous sulfate 325 (65 FE) MG tablet ?Take 1 tablet (325 mg total) by mouth daily with breakfast. ?Start taking on: December 07, 2021 ?  ?furosemide 20 MG tablet ?Commonly known as: LASIX ?TAKE 1 TABLET BY MOUTH AS NEEDED FOR  SWELLING. ?What changed:  ?how much to take ?how to take this ?when to take this ?reasons to take this ?additional instructions ?  ?losartan 50 MG tablet ?Commonly known as: COZAAR ?Take 1.5 tablets (75 mg total) by mouth daily. ?  ?Saline Spray 0.2 % Soln ?Place 1 spray into both nostrils as needed (for congestion). ?  ?vitamin B-12 1000 MCG tablet ?Commonly known as: CYANOCOBALAMIN ?Take 1 tablet (1,000 mcg total) by mouth daily. ?  ?vitamin C 500 MG tablet ?Commonly known as: ASCORBIC ACID ?Take 1,000 mg by mouth 2 (two) times daily. ?  ? ?  ? ? ?Disposition:  ? ? Follow-up Information   ? ? Shirley Friar, PA-C Follow up.   ?Specialty: Physician Assistant ?Why: on 5/9 at 0920 for post hospital follow up ?Contact information: ?[address] ?[place] [address] ?[phone] ? ? ?  ?  ? ? [name], MD. Schedule an appointment as soon as possible for a visit.   ?Specialty: Family Medicine ?Contact information: ?[address] ?[address] ?[name] [address] ?[phone] ? ? ?  ?  ? ?  ?  ? ?  ? ? ?Duration of Discharge Encounter: Greater than 30 minutes including physician time. ? ?Signed, ?Shirley Friar, PA-C  ?12/06/2021 ?1:00 PM ? ?  ?

## 2021-12-06 NOTE — Progress Notes (Signed)
1226 BP 128/65 HR 84, ambulated hall independently with steady gait. No chest pain. O2 sats remain at 95-100% on room air. 1233 BP 149/72 HR 67 after ambulation. ?

## 2021-12-06 NOTE — Progress Notes (Signed)
Pt sats dropped to 84% on room air while sleeping, pt denied shortness of breath or any other c/o.  Applied 2L of oxgen per [place] with sats increasing to 94-95%.  Pt states she was referred to have a sleep study a couple of years ago but never scheduled.  Will continue to monitor. ?

## 2021-12-06 NOTE — Progress Notes (Signed)
?  Echocardiogram ?2D Echocardiogram has been performed. ? ?Fidel Levy ?12/06/2021, 11:08 AM ?

## 2021-12-10 ENCOUNTER — Telehealth: Payer: Self-pay | Admitting: Internal Medicine

## 2021-12-10 NOTE — Telephone Encounter (Signed)
Spoke with pt who reports bilateral lower extremity swelling greater on right side x 1 day.  Pt denies current CP, SOB or dizziness.  BP 135/70 HR 64 and regular per pt.  She is taking Furosemide '20mg'$  - 1 tablet daily.  She reports she is compliant with medications.  She is following a low Na+ diet and elevating feet and legs while sitting. ?Pt advised will discuss with Dr Caryl Comes and contact with further recommendations.  Pt verbalizes understanding and agrees with current plan. ?

## 2021-12-10 NOTE — Telephone Encounter (Signed)
Pt c/o swelling: STAT is pt has developed SOB within 24 hours ? ?If swelling, where is the swelling located? Knee to feet on right, foot on left ? ?How much weight have you gained and in what time span? N/A ? ?Have you gained 3 pounds in a day or 5 pounds in a week? No  ? ?Do you have a log of your daily weights (if so, list)? No  ? ?Are you currently taking a fluid pill? Yes  ? ?Are you currently SOB? No  ? ?Have you traveled recently? No   ?

## 2021-12-10 NOTE — Telephone Encounter (Signed)
Spoke with pt and advised per Dr Caryl Comes increase Furosemide to '40mg'$  - 2 tablets by mouth x 2 days.  Reviewed ED precautions.  Pt verbalizes understanding and agrees with current plan. ?

## 2021-12-15 ENCOUNTER — Telehealth: Payer: Self-pay | Admitting: Internal Medicine

## 2021-12-15 NOTE — Telephone Encounter (Signed)
?  Pt c/o swelling: STAT is pt has developed SOB within 24 hours ? ?If swelling, where is the swelling located? Legs and calf ? ?How much weight have you gained and in what time span? No  ? ?Have you gained 3 pounds in a day or 5 pounds in a week? No  ? ?Do you have a log of your daily weights (if so, list)? None  ? ?Are you currently taking a fluid pill? Yes  ? ?Are you currently SOB? Yes, occasionally  ? ?Have you traveled recently? no  ? ? ?Pt said, after increasing her fluid pill her swelling is still there. She denied any weight gain and now she gets SOB occasionally  ?

## 2021-12-15 NOTE — Telephone Encounter (Signed)
Returned call to Pt. ? ?Pt recently hospitalized with atrial tachycardia. ? ?Post hospitalization she called in to report increased lower extremity edema.  Lasix was increased for 2 days. ? ?Pt returning call to office today to advise edema persists and is becoming painful.  She is requesting to be seen sooner than July to establish with gen cards.  States she had recent Echo that showed "something is wrong with my heart".   ? ?Pt appointment made with DOD 12/16/21 to reestablish with general cardiology for heart failure management. ? ?Pt aware and thanked nurse for call. ? ? ?

## 2021-12-16 ENCOUNTER — Ambulatory Visit (INDEPENDENT_AMBULATORY_CARE_PROVIDER_SITE_OTHER): Payer: Medicaid Other | Admitting: Interventional Cardiology

## 2021-12-16 ENCOUNTER — Other Ambulatory Visit: Payer: Self-pay | Admitting: Internal Medicine

## 2021-12-16 ENCOUNTER — Encounter: Payer: Self-pay | Admitting: Interventional Cardiology

## 2021-12-16 VITALS — BP 112/74 | HR 131 | Ht 68.0 in | Wt 199.6 lb

## 2021-12-16 DIAGNOSIS — I5021 Acute systolic (congestive) heart failure: Secondary | ICD-10-CM

## 2021-12-16 DIAGNOSIS — R600 Localized edema: Secondary | ICD-10-CM

## 2021-12-16 DIAGNOSIS — Z950 Presence of cardiac pacemaker: Secondary | ICD-10-CM

## 2021-12-16 DIAGNOSIS — I25118 Atherosclerotic heart disease of native coronary artery with other forms of angina pectoris: Secondary | ICD-10-CM

## 2021-12-16 DIAGNOSIS — I4891 Unspecified atrial fibrillation: Secondary | ICD-10-CM

## 2021-12-16 NOTE — Progress Notes (Signed)
?  ?Cardiology Office Note ? ? ?Date:  12/16/2021  ? ?ID:  [place], DOB Nov 23, 1965, MRN [id number] ? ?PCP:  [name], MD  ? ? ?No chief complaint on file. ? ? ? ?Wt Readings from Last 3 Encounters:  ?12/16/21 199 lb 9.6 oz (90.5 kg)  ?12/05/21 207 lb 4.8 oz (94 kg)  ?11/01/21 204 lb 6.4 oz (92.7 kg)  ?  ? ?  ?History of Present Illness: ?[place] is a 56 y.o. female with paroxysmal atrial fibrillation seen by Dr. Caryl Comes.  STJ dual chamber PPM implanted 2020 for SND. ? ?Prior records show: ?"Coronary artery disease ?Mitral valve prolapse with severe mitral regurgitation ?Status post CABG plus mitral valve repair in March 2013 ?Systolic CHF 2/2 Dilated cardiomyopathy w/ EF 35-40 >> improved to normal ?EF down to 30% in setting of AFib/Flutter w/ RVR 10/2018 ?History of CVA ?Admitted to [facility] in [place], [name] in 2017 w/ acute L parietal CVA tx with tPA ?Event monitor in 2017: no AFib ?Atrial Flutter ?CHADS2-VASc=6 (CHF, HTN, CVA, CAD, female) >> Apixaban  ?Hypertension ?Hyperlipidemia ?Tobacco use ?  ?Ms. Rua was noted to be in AFlutter w/ RVR in 10/2018.  She was started on anticoagulation and rate controlling drugs with an eye towards cardioversion.  However, management was delayed due to restrictions imposed by COVID-19.  A follow up echo demonstrated worsening LV function with an EF of 30.  She was ultimately seen by Dr. Harrington Challenger and set up for cardioversion on 01/25/2019 with restoration of NSR. She was last seen 02/19/2019.  She was back in atrial fibrillation and her HR was very slow.  She was asymptomatic.  Her Digoxin was reduced.  A follow up echocardiogram 7/14 showed improved LVF with EF 60-65.  Her HR remained low and she was ultimately taken off of Diltiazem and Digoxin. " ? ?Patient called the office on 12/15/21 reporting: "who reports bilateral lower extremity swelling greater on right side x 1 day.  Pt denies current CP, SOB or dizziness.  BP 135/70 HR 64 and regular  per pt.  She is taking Furosemide '20mg'$  - 1 tablet daily.  She reports she is compliant with medications.  She is following a low Na+ diet and elevating feet and legs while sitting."  Lasix was increased.  ? ?She was in the hospitalized in 4/23.  She had a cardioversion, amiodarone increased, and thought not to be a good candidate for an ablation. ? ?Echo showed EF 30-35% in 4/23. ? ?She feels that fast HR has increased and that the cardioversion lasted for 1 week. ? ? ? ?Past Medical History:  ?Diagnosis Date  ? Abdominal pain   ? ? chronic cholecystitis; admxn to [facility] 9/12 (CT, USN, HIDA done)  ? Atrial fibrillation ([place])   ? Cardiomyopathy   ? echo 9/12: mild LVH, EF 35-40%, mod MR (difficult to judge /post leaflet restricted), mod BAE, mod RVE, mod TR   ? Cardiomyopathy ([place])   ? a. echo 9/12: mild LVH, EF 35-40%, mod MR (difficult to judge /post leaflet restricted), mod BAE, mod RVE, mod TR. b. Echo 11/2011: mild LVH, EF 55-60%, s/p MV repair w/o sig MR/MS, mildly dilated RV/RA, PASP 71mHg. // AFL dx 10/2018 - Echo 10/2018: EF 30, diff HK, s/p MV repair with trivial MR, mean MV 7 but not c/w significant mitral stenosis, mild RAE, severe LAE, mod reduced RVSF   ? Carotid artery disease ([place]   ? a. Duplex 06/2015: 50%  RECA, 1-39% BICA.  ? CHF (congestive heart failure) ([place])   ? Clotting disorder ([place])   ? Coronary artery disease   ? a. s/p CABG 10/2011 - LIMA-LAD, SVG-PDA, SVG-OM, SVG-diagonal.  ? Dyslipidemia   ? Essential hypertension   ? Heart murmur   ? Hemifacial spasm   ? History of Doppler ultrasound   ? a. Carotid US [facility] in [place], [name]) 7/17: no hemodynamically significant ICA stenosis  ? Hyperlipidemia   ? Mitral regurgitation   ? a. s/p MV repair 10/2011 - on Coumadin for 3 months afterwards then d/c'd by surgery.  ? Recurrent upper respiratory infection (URI)   ? 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 S/P mitral valve repair 10/21/2011  ? Stroke [facility]) 1989  ? a. age 76 - in setting of cigs and OCPs.  //  b. s/p acute L parietal CVA >> tPA - [facility] in [place], [name]  ? Stroke [facility]) 01/2017  ? Tobacco abuse   ? ? ?Past Surgical History:  ?Procedure Laterality Date  ? CARDIAC CATHETERIZATION    ? CARDIOVERSION N/A 01/25/2019  ? Procedure: CARDIOVERSION;  Surgeon: [name], MD;  Location: [place];  Service: Cardiovascular;  Laterality: N/A;  ? CARDIOVERSION N/A 06/14/2019  ? Procedure: CARDIOVERSION;  Surgeon: [name], MD;  Location: [place];  Service: Cardiovascular;  Laterality: N/A;  ? [place]  ? twins  ? CORONARY ARTERY BYPASS GRAFT  10/21/2011  ? Procedure: CORONARY ARTERY BYPASS GRAFTING (CABG);  Surgeon: [name], MD;  Location: [place];  Service: Open Heart Surgery;  Laterality: N/A;  cabg x four, using right leg greater saphenous vein harvested endoscopically  ? DENTAL SURGERY    ? MITRAL VALVE REPAIR  10/21/2011  ? Procedure: MITRAL VALVE REPAIR (MVR);  Surgeon: [name], MD;  Location: [place];  Service: Open Heart Surgery;  Laterality: N/A;  ? PACEMAKER IMPLANT N/A 06/25/2019  ? Procedure: PACEMAKER IMPLANT;  Surgeon: [name], MD;  Location: [place] CV LAB;  Service: Cardiovascular;  Laterality: N/A;  ? TEE WITHOUT CARDIOVERSION  06/30/2011  ? Procedure: TRANSESOPHAGEAL ECHOCARDIOGRAM (TEE);  Surgeon: [name], MD;  Location: [facility];  Service: Cardiovascular;  Laterality: N/A;  ? TONSILLECTOMY    ? TUBAL LIGATION    ? ? ? ?Current Outpatient Medications  ?Medication Sig Dispense Refill  ? acetaminophen (TYLENOL) 500 MG tablet Take 500-1,000 mg by mouth every 6 (six) hours as needed for headache.    ? amiodarone (PACERONE) 200 MG tablet Take 1 tablet (200 mg total) by mouth 2 (two) times daily. 60 tablet 6  ? amLODipine (NORVASC) 10 MG tablet Take 1 tablet (10 mg total) by mouth daily. 90 tablet 3  ? apixaban (ELIQUIS) 5 MG TABS tablet Take 1 tablet (5 mg total) by mouth 2 (two) times daily. 180 tablet 3  ? atorvastatin  (LIPITOR) 80 MG tablet Take 1 tablet (80 mg total) by mouth daily. 90 tablet 3  ? diphenhydrAMINE (BENADRYL) 25 MG tablet Take 25 mg by mouth every 6 (six) hours as needed for allergies.    ? famotidine (PEPCID) 20 MG tablet Take 20 mg by mouth daily as needed for indigestion.     ? ferrous sulfate 325 (65 FE) MG tablet Take 1 tablet (325 mg total) by mouth daily with breakfast. 30 tablet 3  ? furosemide (LASIX) 20 MG tablet TAKE 1 TABLET BY MOUTH AS NEEDED FOR  SWELLING. (  Patient taking differently: Take 20 mg by mouth daily as needed for fluid or edema.) 90 tablet 2  ? losartan (COZAAR) 50 MG tablet Take 1.5 tablets (75 mg total) by mouth daily. 135 tablet 1  ? Saline 0.2 % SOLN Place 1 spray into both nostrils as needed (for congestion).     ? vitamin B-12 (CYANOCOBALAMIN) 1000 MCG tablet Take 1 tablet (1,000 mcg total) by mouth daily.    ? vitamin C (ASCORBIC ACID) 500 MG tablet Take 1,000 mg by mouth 2 (two) times daily.     ? ?Current Facility-Administered Medications  ?Medication Dose Route Frequency Provider Last Rate Last Admin  ? incobotulinumtoxinA (XEOMIN) 50 units injection 50 Units  50 Units Intramuscular Q90 days Marcial Pacas, MD   50 Units at 11/30/16 1546  ? incobotulinumtoxinA (XEOMIN) 50 units injection 50 Units  50 Units Intramuscular Q90 days Marcial Pacas, MD   50 Units at 07/25/17 [phone]  ? incobotulinumtoxinA (XEOMIN) 50 units injection 50 Units  50 Units Intramuscular Q90 days Marcial Pacas, MD   50 Units at 07/26/17 1217  ? ? ?Allergies:   Azithromycin, Latex, Spinach, and Tape  ? ? ?Social History:  The patient  reports that she quit smoking about 10 years ago. Her smoking use included cigarettes. She has a 30.00 pack-year smoking history. She has never used smokeless tobacco. She reports current alcohol use of about 1.0 standard drink per week. She reports that she does not use drugs.  ? ?Family History:  The patient's family history includes Bone cancer in her maternal grandmother; COPD in her  father; Heart disease in her father; Hypertension in her mother; Stroke in her mother.  ? ? ?ROS:  Please see the history of present illness.   Otherwise, review of systems are positive for leg swelling- n

## 2021-12-16 NOTE — Patient Instructions (Signed)
Medication Instructions:  ?Your physician recommends that you continue on your current medications as directed. Please refer to the Current Medication list given to you today. ? ?*If you need a refill on your cardiac medications before your next appointment, please call your pharmacy* ? ? ?Lab Work: ?Lab work to be done today--BMP ?If you have labs (blood work) drawn today and your tests are completely normal, you will receive your results only by: ?MyChart Message (if you have MyChart) OR ?A paper copy in the mail ?If you have any lab test that is abnormal or we need to change your treatment, we will call you to review the results. ? ? ?Testing/Procedures: ?none ? ? ?Follow-Up: ?At [facility], you and your health needs are our priority.  As part of our continuing mission to provide you with exceptional heart care, we have created designated Provider Care Teams.  These Care Teams include your primary Cardiologist (physician) and Advanced Practice Providers (APPs -  Physician Assistants and Nurse Practitioners) who all work together to provide you with the care you need, when you need it. ? ?We recommend signing up for the patient portal called "MyChart".  Sign up information is provided on this After Visit Summary.  MyChart is used to connect with patients for Virtual Visits (Telemedicine).  Patients are able to view lab/test results, encounter notes, upcoming appointments, etc.  Non-urgent messages can be sent to your provider as well.   ?To learn more about what you can do with MyChart, go to [email or web address].   ? ?Your next appointment:   ?As planned ? ?The format for your next appointment:   ?In Person ? ?Provider:   ?Dr Johney Frame and Dr Caryl Comes ? ?If primary card or EP is not listed click here to update    :1}  ? ? ?Other Instructions ?  ? ?Important Information About Sugar ? ? ? ? ? ? ?

## 2021-12-17 ENCOUNTER — Encounter (HOSPITAL_COMMUNITY): Payer: Self-pay

## 2021-12-17 ENCOUNTER — Emergency Department (HOSPITAL_COMMUNITY): Payer: Medicaid Other

## 2021-12-17 ENCOUNTER — Other Ambulatory Visit: Payer: Self-pay

## 2021-12-17 ENCOUNTER — Inpatient Hospital Stay (HOSPITAL_COMMUNITY)
Admission: EM | Admit: 2021-12-17 | Discharge: 2021-12-22 | DRG: 291 | Disposition: A | Discharge status: Home / Self Care | Payer: Medicaid Other | Attending: Internal Medicine | Admitting: Internal Medicine

## 2021-12-17 ENCOUNTER — Emergency Department (HOSPITAL_BASED_OUTPATIENT_CLINIC_OR_DEPARTMENT_OTHER): Payer: Medicaid Other

## 2021-12-17 DIAGNOSIS — I4819 Other persistent atrial fibrillation: Secondary | ICD-10-CM | POA: Diagnosis present

## 2021-12-17 DIAGNOSIS — Z950 Presence of cardiac pacemaker: Secondary | ICD-10-CM

## 2021-12-17 DIAGNOSIS — I495 Sick sinus syndrome: Secondary | ICD-10-CM | POA: Diagnosis present

## 2021-12-17 DIAGNOSIS — Z8249 Family history of ischemic heart disease and other diseases of the circulatory system: Secondary | ICD-10-CM

## 2021-12-17 DIAGNOSIS — I429 Cardiomyopathy, unspecified: Secondary | ICD-10-CM

## 2021-12-17 DIAGNOSIS — Z87891 Personal history of nicotine dependence: Secondary | ICD-10-CM

## 2021-12-17 DIAGNOSIS — I4891 Unspecified atrial fibrillation: Secondary | ICD-10-CM | POA: Diagnosis present

## 2021-12-17 DIAGNOSIS — Z823 Family history of stroke: Secondary | ICD-10-CM

## 2021-12-17 DIAGNOSIS — Z951 Presence of aortocoronary bypass graft: Secondary | ICD-10-CM | POA: Diagnosis not present

## 2021-12-17 DIAGNOSIS — I420 Dilated cardiomyopathy: Secondary | ICD-10-CM

## 2021-12-17 DIAGNOSIS — R9431 Abnormal electrocardiogram [ECG] [EKG]: Secondary | ICD-10-CM | POA: Diagnosis present

## 2021-12-17 DIAGNOSIS — R9389 Abnormal findings on diagnostic imaging of other specified body structures: Secondary | ICD-10-CM | POA: Diagnosis not present

## 2021-12-17 DIAGNOSIS — I5043 Acute on chronic combined systolic (congestive) and diastolic (congestive) heart failure: Secondary | ICD-10-CM | POA: Diagnosis present

## 2021-12-17 DIAGNOSIS — Z9889 Other specified postprocedural states: Secondary | ICD-10-CM | POA: Diagnosis not present

## 2021-12-17 DIAGNOSIS — I255 Ischemic cardiomyopathy: Secondary | ICD-10-CM | POA: Diagnosis present

## 2021-12-17 DIAGNOSIS — I083 Combined rheumatic disorders of mitral, aortic and tricuspid valves: Secondary | ICD-10-CM | POA: Diagnosis present

## 2021-12-17 DIAGNOSIS — Z9109 Other allergy status, other than to drugs and biological substances: Secondary | ICD-10-CM

## 2021-12-17 DIAGNOSIS — Z7901 Long term (current) use of anticoagulants: Secondary | ICD-10-CM

## 2021-12-17 DIAGNOSIS — J189 Pneumonia, unspecified organism: Secondary | ICD-10-CM | POA: Diagnosis present

## 2021-12-17 DIAGNOSIS — M7989 Other specified soft tissue disorders: Secondary | ICD-10-CM | POA: Diagnosis not present

## 2021-12-17 DIAGNOSIS — I10 Essential (primary) hypertension: Secondary | ICD-10-CM | POA: Diagnosis not present

## 2021-12-17 DIAGNOSIS — Z8673 Personal history of transient ischemic attack (TIA), and cerebral infarction without residual deficits: Secondary | ICD-10-CM

## 2021-12-17 DIAGNOSIS — Z881 Allergy status to other antibiotic agents status: Secondary | ICD-10-CM

## 2021-12-17 DIAGNOSIS — R011 Cardiac murmur, unspecified: Secondary | ICD-10-CM | POA: Diagnosis present

## 2021-12-17 DIAGNOSIS — R0600 Dyspnea, unspecified: Secondary | ICD-10-CM | POA: Diagnosis not present

## 2021-12-17 DIAGNOSIS — I2510 Atherosclerotic heart disease of native coronary artery without angina pectoris: Secondary | ICD-10-CM | POA: Diagnosis not present

## 2021-12-17 DIAGNOSIS — Z91018 Allergy to other foods: Secondary | ICD-10-CM

## 2021-12-17 DIAGNOSIS — D72819 Decreased white blood cell count, unspecified: Secondary | ICD-10-CM | POA: Diagnosis present

## 2021-12-17 DIAGNOSIS — R188 Other ascites: Secondary | ICD-10-CM | POA: Diagnosis present

## 2021-12-17 DIAGNOSIS — D689 Coagulation defect, unspecified: Secondary | ICD-10-CM | POA: Diagnosis present

## 2021-12-17 DIAGNOSIS — Z79899 Other long term (current) drug therapy: Secondary | ICD-10-CM

## 2021-12-17 DIAGNOSIS — E785 Hyperlipidemia, unspecified: Secondary | ICD-10-CM | POA: Diagnosis present

## 2021-12-17 DIAGNOSIS — I4892 Unspecified atrial flutter: Secondary | ICD-10-CM | POA: Diagnosis present

## 2021-12-17 DIAGNOSIS — I509 Heart failure, unspecified: Secondary | ICD-10-CM

## 2021-12-17 DIAGNOSIS — Z91040 Latex allergy status: Secondary | ICD-10-CM

## 2021-12-17 DIAGNOSIS — I110 Hypertensive heart disease with heart failure: Principal | ICD-10-CM | POA: Diagnosis present

## 2021-12-17 DIAGNOSIS — I471 Supraventricular tachycardia: Secondary | ICD-10-CM | POA: Diagnosis not present

## 2021-12-17 LAB — CBC WITH DIFFERENTIAL/PLATELET
Abs Immature Granulocytes: 0.01 10*3/uL (ref 0.00–0.07)
Basophils Absolute: 0 10*3/uL (ref 0.0–0.1)
Basophils Relative: 1 %
Eosinophils Absolute: 0.1 10*3/uL (ref 0.0–0.5)
Eosinophils Relative: 3 %
HCT: 37.5 % (ref 36.0–46.0)
Hemoglobin: 11.1 g/dL — ABNORMAL LOW (ref 12.0–15.0)
Immature Granulocytes: 0 %
Lymphocytes Relative: 20 %
Lymphs Abs: 0.7 10*3/uL (ref 0.7–4.0)
MCH: 27.6 pg (ref 26.0–34.0)
MCHC: 29.6 g/dL — ABNORMAL LOW (ref 30.0–36.0)
MCV: 93.3 fL (ref 80.0–100.0)
Monocytes Absolute: 0.6 10*3/uL (ref 0.1–1.0)
Monocytes Relative: 15 %
Neutro Abs: 2.3 10*3/uL (ref 1.7–7.7)
Neutrophils Relative %: 61 %
Platelets: 227 10*3/uL (ref 150–400)
RBC: 4.02 MIL/uL (ref 3.87–5.11)
RDW: 17.9 % — ABNORMAL HIGH (ref 11.5–15.5)
WBC: 3.8 10*3/uL — ABNORMAL LOW (ref 4.0–10.5)
nRBC: 0 % (ref 0.0–0.2)

## 2021-12-17 LAB — GLUCOSE, CAPILLARY: Glucose-Capillary: 93 mg/dL (ref 70–99)

## 2021-12-17 LAB — BASIC METABOLIC PANEL
Anion gap: 8 (ref 5–15)
BUN/Creatinine Ratio: 12 (ref 9–23)
BUN: 11 mg/dL (ref 6–24)
BUN: 12 mg/dL (ref 6–20)
CO2: 21 mmol/L (ref 20–29)
CO2: 25 mmol/L (ref 22–32)
Calcium: 9.5 mg/dL (ref 8.7–10.2)
Calcium: 9.4 mg/dL (ref 8.9–10.3)
Chloride: 104 mmol/L (ref 96–106)
Chloride: 107 mmol/L (ref 98–111)
Creatinine, Ser: 0.93 mg/dL (ref 0.57–1.00)
Creatinine, Ser: 1 mg/dL (ref 0.44–1.00)
GFR, Estimated: >60 mL/min (ref >60)
Glucose, Bld: 107 mg/dL — ABNORMAL HIGH (ref 70–99)
Glucose: 99 mg/dL (ref 70–99)
Potassium: 4.5 mmol/L (ref 3.5–5.2)
Potassium: 4.5 mmol/L (ref 3.5–5.1)
Sodium: 143 mmol/L (ref 134–144)
Sodium: 140 mmol/L (ref 135–145)
eGFR: 72 mL/min/{1.73_m2} (ref >59)

## 2021-12-17 LAB — D-DIMER, QUANTITATIVE: D-Dimer, Quant: 17.29 ug/mL-FEU — ABNORMAL HIGH (ref 0.00–0.50)

## 2021-12-17 LAB — BRAIN NATRIURETIC PEPTIDE: B Natriuretic Peptide: 1304.4 pg/mL — ABNORMAL HIGH (ref 0.0–100.0)

## 2021-12-17 MED ORDER — FUROSEMIDE 10 MG/ML IJ SOLN
40.0000 mg | Freq: Every day | INTRAMUSCULAR | Status: DC
  Administered 2021-12-18 – 2021-12-19 (×2): 40 mg via INTRAVENOUS
  Filled 2021-12-17 (×2): qty 4

## 2021-12-17 MED ORDER — SODIUM CHLORIDE 0.9 % IV SOLN: 250.0000 mL | INTRAVENOUS | Status: DC | PRN

## 2021-12-17 MED ORDER — ONDANSETRON HCL 4 MG/2ML IJ SOLN: 4.0000 mg | Freq: Four times a day (QID) | INTRAMUSCULAR | Status: DC | PRN

## 2021-12-17 MED ORDER — SODIUM CHLORIDE 0.9% FLUSH: 3.0000 mL | INTRAVENOUS | Status: DC | PRN

## 2021-12-17 MED ORDER — APIXABAN 5 MG PO TABS
5.0000 mg | ORAL_TABLET | Freq: Two times a day (BID) | ORAL | Status: DC
  Administered 2021-12-17 – 2021-12-22 (×10): 5 mg via ORAL
  Filled 2021-12-17 (×10): qty 1

## 2021-12-17 MED ORDER — AMIODARONE HCL IN DEXTROSE 360-4.14 MG/200ML-% IV SOLN
30.0000 mg/h | INTRAVENOUS | Status: DC
Start: 2021-12-17 — End: 2021-12-21
  Administered 2021-12-18 – 2021-12-21 (×7): 30 mg/h via INTRAVENOUS
  Filled 2021-12-17 (×8): qty 200

## 2021-12-17 MED ORDER — DOXYCYCLINE HYCLATE 100 MG PO TABS
100.0000 mg | ORAL_TABLET | Freq: Two times a day (BID) | ORAL | Status: DC
  Administered 2021-12-17 – 2021-12-22 (×10): 100 mg via ORAL
  Filled 2021-12-17 (×10): qty 1

## 2021-12-17 MED ORDER — AMIODARONE HCL IN DEXTROSE 360-4.14 MG/200ML-% IV SOLN
60.0000 mg/h | INTRAVENOUS | Status: AC
  Administered 2021-12-17 (×3): 60 mg/h via INTRAVENOUS
  Filled 2021-12-17: qty 200
  Filled 2021-12-17: qty 400

## 2021-12-17 MED ORDER — AMIODARONE LOAD VIA INFUSION
150.0000 mg | Freq: Once | INTRAVENOUS | Status: AC
  Administered 2021-12-17: 150 mg via INTRAVENOUS
  Filled 2021-12-17: qty 83.34

## 2021-12-17 MED ORDER — SODIUM CHLORIDE 0.9% FLUSH
3.0000 mL | Freq: Two times a day (BID) | INTRAVENOUS | Status: DC
  Administered 2021-12-17 – 2021-12-22 (×9): 3 mL via INTRAVENOUS

## 2021-12-17 MED ORDER — AMLODIPINE BESYLATE 5 MG PO TABS
10.0000 mg | ORAL_TABLET | Freq: Every day | ORAL | Status: DC
  Filled 2021-12-17: qty 2

## 2021-12-17 MED ORDER — ACETAMINOPHEN 325 MG PO TABS
650.0000 mg | ORAL_TABLET | ORAL | Status: DC | PRN
  Administered 2021-12-18: 650 mg via ORAL
  Filled 2021-12-17: qty 2

## 2021-12-17 MED ORDER — LOSARTAN POTASSIUM 50 MG PO TABS
75.0000 mg | ORAL_TABLET | Freq: Every day | ORAL | Status: AC
  Administered 2021-12-18: 75 mg via ORAL
  Filled 2021-12-17: qty 2

## 2021-12-17 MED ORDER — FUROSEMIDE 10 MG/ML IJ SOLN
40.0000 mg | Freq: Once | INTRAMUSCULAR | Status: AC
  Administered 2021-12-17: 40 mg via INTRAVENOUS
  Filled 2021-12-17: qty 4

## 2021-12-17 MED ORDER — ATORVASTATIN CALCIUM 40 MG PO TABS
80.0000 mg | ORAL_TABLET | Freq: Every day | ORAL | Status: DC
  Administered 2021-12-18 – 2021-12-22 (×5): 80 mg via ORAL
  Filled 2021-12-17 (×5): qty 2

## 2021-12-17 MED ORDER — IOHEXOL 350 MG/ML SOLN
65.0000 mL | Freq: Once | INTRAVENOUS | Status: AC | PRN
Start: 2021-12-17 — End: 2021-12-17
  Administered 2021-12-17: 65 mL via INTRAVENOUS

## 2021-12-17 MED ORDER — SODIUM CHLORIDE 0.9 % IV SOLN
2.0000 g | INTRAVENOUS | Status: AC
  Administered 2021-12-17 – 2021-12-21 (×5): 2 g via INTRAVENOUS
  Filled 2021-12-17 (×5): qty 20

## 2021-12-17 NOTE — Assessment & Plan Note (Addendum)
Patient was admitted to the cardiac unit and received aggressive diuresis with IV furosemide, negative fluid balance has achieved, -4,240 ml, with improvement in her symptoms.  ?Patient sp mitral valve repair in the past.  ? ?Echocardiogram showed reduction in systolic function 30 to 92% with global hypokinesis. Mildly D shaped interventricular septum, moderate reduction in RV systolic function, moderate RV enlargement, RVSP 58.3 mmHg. Severe left atrial dilatation. Moderate to severe mitral stenosis with mean gradient 9 mmHg. Severe tricuspid valve regurgitation.  ? ?Patient will continue heart failure management with entresto and metoprolol.  ?Follow up with structural hear team as outpatient.  ?

## 2021-12-17 NOTE — ED Triage Notes (Signed)
Pt arrived POV c/o worsening SHOB and right leg swelling since 4/16 after having a cardioversion. Pt denies any pain. ?

## 2021-12-17 NOTE — Assessment & Plan Note (Signed)
Functional, not AICD. ?Monitor unfortunately reads "V Tach" when ever the pacemaker is pacing (although patient is not actually in V. Tach). ?

## 2021-12-17 NOTE — Consult Note (Signed)
? ?CONSULTATION NOTE  ? ?Patient Name: Ashley [name] ?Date of Encounter: 12/17/2021 ?Cardiologist: Dorris Carnes, MD ?Electrophysiologist: Virl Axe, MD ?Advanced Heart Failure: None ? ? ?Chief Complaint  ? ?Shortness of breath ? ?Patient Profile  ? ?56 yo female with PAF, s/p ST Jude PPM, s/p CABG with dilated CM and LVEF 30-35% on 12/06/2021, now presents with shortness of breath ? ?HPI  ? ?Ashley [name] is a 56 y.o. female who is being seen today for the evaluation of VT at the request of Dr. Matilde Sprang. This is a 56 year old female with a history of PAF, coronary artery disease status post CABG and mitral valve repair in 2013, mitral valve prolapse prior to that with severe MR, history of dilated cardiomyopathy with EF 35 to 40% that improved however more recently down to 30 to 35% during a recent hospitalization about 2 weeks ago.  She was seen in the office yesterday by Dr. Irish Lack and noted to be in a fast rate,, likely A-fib with RVR.  In the past she has had prior cardioversions and had been on a number of antiarrhythmic therapies.  She could not tolerate digoxin or diltiazem and had been on dofetilide which was also failed.  She has subsequently been on amiodarone 200 mg twice a day.  In the office yesterday she was thought to have had some improvement in her edema but she says that she did not feel that she was significantly better and therefore prompted her to come back to the emergency department.  I was called for ventricular tachycardia, which apparently she has a history of, after however reviewing her EKGs and showing them to the electrophysiology physician assistant, we agree that she is actually intermittently ventricular pacing and tracking the high rates of her A-fib which appears to be uncontrolled. ? ?PMHx  ? ?Past Medical History:  ?Diagnosis Date  ? Abdominal pain   ? ? chronic cholecystitis; admxn to [facility] 9/12 (CT, USN, HIDA done)  ? Atrial fibrillation ([place])   ? Cardiomyopathy    ? echo 9/12: mild LVH, EF 35-40%, mod MR (difficult to judge /post leaflet restricted), mod BAE, mod RVE, mod TR   ? Cardiomyopathy ([place])   ? a. echo 9/12: mild LVH, EF 35-40%, mod MR (difficult to judge /post leaflet restricted), mod BAE, mod RVE, mod TR. b. Echo 11/2011: mild LVH, EF 55-60%, s/p MV repair w/o sig MR/MS, mildly dilated RV/RA, PASP 32mHg. // AFL dx 10/2018 - Echo 10/2018: EF 30, diff HK, s/p MV repair with trivial MR, mean MV 7 but not c/w significant mitral stenosis, mild RAE, severe LAE, mod reduced RVSF   ? Carotid artery disease ([place]   ? a. Duplex 06/2015: 50% RECA, 1-39% BICA.  ? CHF (congestive heart failure) ([place]   ? Clotting disorder ([place]   ? Coronary artery disease   ? a. s/p CABG 10/2011 - LIMA-LAD, SVG-PDA, SVG-OM, SVG-diagonal.  ? Dyslipidemia   ? Essential hypertension   ? Heart murmur   ? Hemifacial spasm   ? History of Doppler ultrasound   ? [facility] [place] [place] [facility]: no hemodynamically significant ICA stenosis  ? Hyperlipidemia   ? Mitral regurgitation   ? a. s/p MV repair 10/2011 - on Coumadin for 3 months afterwards then d/c'd by surgery.  ? Recurrent upper respiratory infection (URI)   ? 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 S/P mitral valve repair 10/21/2011  ? Stroke ([facility] 1989  ? a.  age 46 - in setting of cigs and OCPs. //  b. s/p acute L parietal CVA >> tPA - [facility] in [place], [name]  ? Stroke [facility]) 01/2017  ? Tobacco abuse   ? ? ?Past Surgical History:  ?Procedure Laterality Date  ? CARDIAC CATHETERIZATION    ? CARDIOVERSION N/A 01/25/2019  ? Procedure: CARDIOVERSION;  Surgeon: [name], MD;  Location: [place];  Service: Cardiovascular;  Laterality: N/A;  ? CARDIOVERSION N/A 06/14/2019  ? Procedure: CARDIOVERSION;  Surgeon: [name], MD;  Location: [place];  Service: Cardiovascular;  Laterality: N/A;  ? [place]  ? twins  ? CORONARY ARTERY BYPASS GRAFT  10/21/2011  ? Procedure: CORONARY ARTERY BYPASS  GRAFTING (CABG);  Surgeon: [name], MD;  Location: [place];  Service: Open Heart Surgery;  Laterality: N/A;  cabg x four, using right leg greater saphenous vein harvested endoscopically  ? DENTAL SURGERY    ? MITRAL VALVE REPAIR  10/21/2011  ? Procedure: MITRAL VALVE REPAIR (MVR);  Surgeon: [name], MD;  Location: [place];  Service: Open Heart Surgery;  Laterality: N/A;  ? PACEMAKER IMPLANT N/A 06/25/2019  ? Procedure: PACEMAKER IMPLANT;  Surgeon: [name], MD;  Location: [place] CV LAB;  Service: Cardiovascular;  Laterality: N/A;  ? TEE WITHOUT CARDIOVERSION  06/30/2011  ? Procedure: TRANSESOPHAGEAL ECHOCARDIOGRAM (TEE);  Surgeon: [name], MD;  Location: [facility] ENDOSCOPY;  Service: Cardiovascular;  Laterality: N/A;  ? TONSILLECTOMY    ? TUBAL LIGATION    ? ? ?FAMHx  ? ?Family History  ?Problem Relation Age of Onset  ? Hypertension Mother   ? Stroke Mother   ? Heart disease Father   ? COPD Father   ? Bone cancer Maternal Grandmother   ? Anesthesia problems Neg Hx   ? Hypotension Neg Hx   ? Malignant hyperthermia Neg Hx   ? Pseudochol deficiency Neg Hx   ? Colon cancer Neg Hx   ? ? ?SOCHx  ? ? reports that she quit smoking about 10 years ago. Her smoking use included cigarettes. She has a 30.00 pack-year smoking history. She has never used smokeless tobacco. She reports current alcohol use of about 1.0 standard drink per week. She reports that she does not use drugs. ? ?Outpatient Medications  ? ?Current Facility-Administered Medications on File Prior to Encounter  ?Medication Dose Route Frequency Provider Last Rate Last Admin  ? incobotulinumtoxinA (XEOMIN) 50 units injection 50 Units  50 Units Intramuscular Q90 days Marcial Pacas, MD   50 Units at 11/30/16 1546  ? incobotulinumtoxinA (XEOMIN) 50 units injection 50 Units  50 Units Intramuscular Q90 days Marcial Pacas, MD   50 Units at 07/25/17 [phone]  ? incobotulinumtoxinA (XEOMIN) 50 units injection 50 Units  50 Units Intramuscular Q90 days Marcial Pacas, MD   50 Units at 07/26/17 1217  ? ?Current Outpatient Medications on File Prior to Encounter  ?Medication Sig Dispense Refill  ? acetaminophen (TYLENOL) 500 MG tablet Take 500-1,000 mg by mouth every 6 (six) hours as needed for headache.    ? amiodarone (PACERONE) 200 MG tablet Take 1 tablet (200 mg total) by mouth 2 (two) times daily. 60 tablet 6  ? amLODipine (NORVASC) 10 MG tablet Take 1 tablet (10 mg total) by mouth daily. 90 tablet 3  ? apixaban (ELIQUIS) 5 MG TABS tablet Take 1 tablet (5 mg total) by mouth 2 (two) times daily. 180 tablet 3  ? atorvastatin (LIPITOR) 80  MG tablet Take 1 tablet (80 mg total) by mouth daily. 90 tablet 3  ? diphenhydrAMINE (BENADRYL) 25 MG tablet Take 25 mg by mouth every 6 (six) hours as needed for allergies.    ? famotidine (PEPCID) 20 MG tablet Take 20 mg by mouth daily as needed for indigestion.     ? ferrous sulfate 325 (65 FE) MG tablet Take 1 tablet (325 mg total) by mouth daily with breakfast. 30 tablet 3  ? furosemide (LASIX) 20 MG tablet TAKE 1 TABLET BY MOUTH AS NEEDED FOR SWELLING 90 tablet 3  ? losartan (COZAAR) 50 MG tablet Take 1.5 tablets (75 mg total) by mouth daily. 135 tablet 1  ? Saline 0.2 % SOLN Place 1 spray into both nostrils as needed (for congestion).     ? vitamin B-12 (CYANOCOBALAMIN) 1000 MCG tablet Take 1 tablet (1,000 mcg total) by mouth daily.    ? vitamin C (ASCORBIC ACID) 500 MG tablet Take 1,000 mg by mouth 2 (two) times daily.     ? ? ?Inpatient Medications  ?  ?Scheduled Meds: ? incobotulinumtoxinA  50 Units Intramuscular Q90 days  ? incobotulinumtoxinA  50 Units Intramuscular Q90 days  ? incobotulinumtoxinA  50 Units Intramuscular Q90 days  ? ? ?Continuous Infusions: ? amiodarone 60 mg/hr (12/17/21 1757)  ? Followed by  ? amiodarone    ? ? ?PRN Meds: ?  ? ?ALLERGIES  ? ?Allergies  ?Allergen Reactions  ? Azithromycin Nausea Only  ? Latex Rash  ? Spinach Hives  ? Tape Other (See Comments)  ?  PLEASE USE PAPER TAPE!! AFFECTS SKIN BADLY!!!!   ? ? ?ROS  ? ?Pertinent items noted in HPI and remainder of comprehensive ROS otherwise negative. ? ?Vitals  ? ?Vitals:  ? 12/17/21 1441 12/17/21 1700 12/17/21 1730 12/17/21 1758  ?BP: 123/80 125/78 113/71 10

## 2021-12-17 NOTE — ED Notes (Signed)
Patient having runs of [facility] MD notified and new EKG captured ? ?

## 2021-12-17 NOTE — Assessment & Plan Note (Addendum)
No acute coronary syndrome. ?Continue with metoprolol and statin therapy.  ?

## 2021-12-17 NOTE — ED Provider Triage Note (Signed)
Emergency Medicine Provider Triage Evaluation Note ? ?Shepherd , a 56 y.o. female  was evaluated in triage.  Pt complains of shortness of breath.  Patient states she had a recent cardioversion for atrial fibrillation.  She was doing well for several days, but over the past couple of days she is felt like her heart rate is getting higher again.  She has had associated shortness of breath.  Has noted some right leg swelling and redness.  This has been ongoing for about a week and a half now. She is on Eliquis.  She has had no change in her medical therapy. ? ?Review of Systems  ?Positive: Shortness of breath, leg swelling, palpitations ?Negative:  ? ?Physical Exam  ?BP 125/88 (BP Location: Right Arm)   Pulse (!) 133   Temp 98.5 ?F (36.9 ?C) (Oral)   Resp 18   LMP 05/18/2015 Comment: went for 3 week after skipping two months   SpO2 94%  ?Gen:   Awake, no distress   ?Resp:  Normal effort  ?MSK:   Moves extremities without difficulty  ?Other:  Right leg is swollen with some erythema in the calf.  There is tenderness in posterior calf.  Pedal pulses intact. ?Lungs are clear to auscultation bilaterally. ?She was initially tachycardic to 133, however while I was room room heart rate normalized on the monitor. ? ?Medical Decision Making  ?Medically screening exam initiated at 1:06 PM.  Appropriate orders placed.  Abeytas was informed that the remainder of the evaluation will be completed by another provider, this initial triage assessment does not replace that evaluation, and the importance of remaining in the ED until their evaluation is complete. ? ?DVT study ordered, Shortness of breath workup.  ?  Adolphus Birchwood, PA-C ?12/17/21 1307 ? ?

## 2021-12-17 NOTE — Assessment & Plan Note (Addendum)
Patient was treated for community acquired pneumonia with antibiotic therapy.  ?Plan to follow up with pulmonary as outpatient.  ?

## 2021-12-17 NOTE — H&P (Signed)
?History and Physical  ? ? ?Patient: Ashley [name] XMI:[id number] DOB: [birth year] ?DOA: 12/17/2021 ?DOS: the patient was seen and examined on 12/17/2021 ?PCP: [name], MD  ?Patient coming from: Home ? ?Chief Complaint:  ?Chief Complaint  ?Patient presents with  ? Shortness of Breath  ? ?HPI: Ashley [name] is a 56 y.o. female with medical history significant of A.Fib, CAD s/p CABG, HFrEF (30-35% as of 2 weeks ago), HTN. ? ?Pt in to ED with worsening BLE edema and progressive dyspnea.  Feels like she never really got better from last hospitalization (2 weeks ago).  Symptoms constant, persistent. ? ?In ED some concern for VT but this just A.Fib RVR paced (see Dr. Debara Pickett note). ? ? ?Review of Systems: As mentioned in the history of present illness. All other systems reviewed and are negative. ?Past Medical History:  ?Diagnosis Date  ? Abdominal pain   ? ? chronic cholecystitis; adm[facility]n to [facility] 9/12 (CT, USN, HIDA done)  ? Atrial fibrillation ([place])   ? Cardiomyopathy   ? echo 9/12: mild LVH, EF 35-40%, mod MR (difficult to judge /post leaflet restricted), mod BAE, mod RVE, mod TR   ? Cardiomyopathy ([place])   ? a. echo 9/12: mild LVH, EF 35-40%, mod MR (difficult to judge /post leaflet restricted), mod BAE, mod RVE, mod TR. b. Echo 11/2011: mild LVH, EF 55-60%, s/p MV repair w/o sig MR/MS, mildly dilated RV/RA, PASP 10mHg. // AFL dx 10/2018 - Echo 10/2018: EF 30, diff HK, s/p MV repair with trivial MR, mean MV 7 but not c/w significant mitral stenosis, mild RAE, severe LAE, mod reduced RVSF   ? Carotid artery disease ([place]   ? a. Duplex 06/2015: 50% RECA, 1-39% BICA.  ? CHF (congestive heart failure) ([place]   ? Clotting disorder ([place]   ? Coronary artery disease   ? a. s/p CABG 10/2011 - LIMA-LAD, SVG-PDA, SVG-OM, SVG-diagonal.  ? Dyslipidemia   ? Essential hypertension   ? Heart murmur   ? Hemifacial spasm   ? History of Doppler ultrasound   ? a[place] [facility] [place] 7[place]: no  hemodynamically significant ICA stenosis  ? Hyperlipidemia   ? Mitral regurgitation   ? a. s/p MV repair 10/2011 - on Coumadin for 3 months afterwards then d/c'd by surgery.  ? Recurrent upper respiratory infection (URI)   ? 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 S/P mitral valve repair 10/21/2011  ? Stroke ([facility] 1989  ? a. age 56- in setting of cigs and OCPs. //  b. [facility] [place] ? St[facility] ([facility] 01/2017  ? Tobacco abuse   ? ?Past Surgical History:  ?Procedure Laterality Date  ? CARDIAC CATHETERIZATION    ? CARDIOVERSION N/A 01/25/2019  ? Procedure: CARDIOVERSION;  Surgeon: HPixie Casino MD;  [place]  Service: Cardiovascular;  Laterality: N/A;  ? CARDIOVERSION N/A 06/14/2019  ? Procedure: CARDIOVERSION;  Surgeon: HPixie Casino MD;  [place]  Service: Cardiovascular;  Laterality: N/A;  ? CAxis ? twins  ? CORONARY ARTERY BYPASS GRAFT  10/21/2011  ? Procedure: CORONARY ARTERY BYPASS GRAFTING (CABG);  Surgeon: CRexene Alberts MD;  [place]  Service: Open Heart Surgery;  Laterality: N/A;  cabg x four, using right leg greater saphenous vein harvested endoscopically  ? DENTAL SURGERY    ? MITRAL VALVE REPAIR  10/21/2011  ?  Procedure: MITRAL VALVE REPAIR (MVR);  Surgeon: [name], MD;  Location: [place];  Service: Open Heart Surgery;  Laterality: N/A;  ? PACEMAKER IMPLANT N/A 06/25/2019  ? Procedure: PACEMAKER IMPLANT;  Surgeon: [name], MD;  Location: [place] CV LAB;  Service: Cardiovascular;  Laterality: N/A;  ? TEE WITHOUT CARDIOVERSION  06/30/2011  ? Procedure: TRANSESOPHAGEAL ECHOCARDIOGRAM (TEE);  Surgeon: [name], MD;  Location: [facility] ENDOSCOPY;  Service: Cardiovascular;  Laterality: N/A;  ? TONSILLECTOMY    ? TUBAL LIGATION    ? ?Social History:  reports that she quit smoking about 10 years ago. Her smoking use included cigarettes. She has a 30.00 pack-year smoking history. She has never  used smokeless tobacco. She reports current alcohol use of about 1.0 standard drink per week. She reports that she does not use drugs. ? ?Allergies  ?Allergen Reactions  ? Azithromycin Nausea Only  ? Latex Rash  ? Spinach Hives  ? Tape Other (See Comments)  ?  PLEASE USE PAPER TAPE!! AFFECTS SKIN BADLY!!!!  ? ? ?Family History  ?Problem Relation Age of Onset  ? Hypertension Mother   ? Stroke Mother   ? Heart disease Father   ? COPD Father   ? Bone cancer Maternal Grandmother   ? Anesthesia problems Neg Hx   ? Hypotension Neg Hx   ? Malignant hyperthermia Neg Hx   ? Pseudochol deficiency Neg Hx   ? Colon cancer Neg Hx   ? ? ?Prior to Admission medications   ?Medication Sig Start Date End Date Taking? Authorizing Provider  ?acetaminophen (TYLENOL) 500 MG tablet Take 500-1,000 mg by mouth every 6 (six) hours as needed for headache.    [provider]  ?amiodarone (PACERONE) 200 MG tablet Take 1 tablet (200 mg total) by mouth 2 (two) times daily. 12/06/21   Shirley Friar, PA-C  ?amLODipine (NORVASC) 10 MG tablet Take 1 tablet (10 mg total) by mouth daily. 01/15/21   [name], MD  ?apixaban (ELIQUIS) 5 MG TABS tablet Take 1 tablet (5 mg total) by mouth 2 (two) times daily. 11/01/21   [name], MD  ?atorvastatin (LIPITOR) 80 MG tablet Take 1 tablet (80 mg total) by mouth daily. 01/22/21   [name], MD  ?diphenhydrAMINE (BENADRYL) 25 MG tablet Take 25 mg by mouth every 6 (six) hours as needed for allergies.    [provider]  ?famotidine (PEPCID) 20 MG tablet Take 20 mg by mouth daily as needed for indigestion.     [provider]  ?ferrous sulfate 325 (65 FE) MG tablet Take 1 tablet (325 mg total) by mouth daily with breakfast. 12/07/21   Shirley Friar, PA-C  ?furosemide (LASIX) 20 MG tablet TAKE 1 TABLET BY MOUTH AS NEEDED FOR SWELLING 12/16/21   [name], MD  ?losartan (COZAAR) 50 MG tablet Take 1.5 tablets (75 mg total) by mouth daily. 11/01/21    [name], MD  ?Saline 0.2 % SOLN Place 1 spray into both nostrils as needed (for congestion).     [provider]  ?vitamin B-12 (CYANOCOBALAMIN) 1000 MCG tablet Take 1 tablet (1,000 mcg total) by mouth daily. 06/21/16   Rosalin Hawking, MD  ?vitamin C (ASCORBIC ACID) 500 MG tablet Take 1,000 mg by mouth 2 (two) times daily.     [provider]  ? ? ?Physical Exam: ?Vitals:  ? 12/17/21 1730 12/17/21 1758 12/17/21 1915 12/17/21 1945  ?BP: 113/71 107/77 124/76 121/83  ?Pulse: [name])  102 (!) 101 98 73  ?Resp: 20 19 (!) 21 20  ?Temp:      ?TempSrc:      ?SpO2: 94% 96% 91% 94%  ?Weight:      ?Height:      ? ?Constitutional: NAD, calm, comfortable ?Eyes: PERRL, lids and conjunctivae normal ?ENMT: Mucous membranes are moist. Posterior pharynx clear of any exudate or lesions.Normal dentition.  ?Neck: normal, supple, no masses, no thyromegaly ?Respiratory: clear to auscultation bilaterally, no wheezing, no crackles. Normal respiratory effort. No accessory muscle use.  ?Cardiovascular: IRR, IRR, 2+ BLE edema ?Abdomen: no tenderness, no masses palpated. No hepatosplenomegaly. Bowel sounds positive.  ?Musculoskeletal: no clubbing / cyanosis. No joint deformity upper and lower extremities. Good ROM, no contractures. Normal muscle tone.  ?Skin: no rashes, lesions, ulcers. No induration ?Neurologic: CN 2-12 grossly intact. Sensation intact, DTR normal. Strength 5/5 in all 4.  ?Psychiatric: Normal judgment and insight. Alert and oriented x 3. Normal mood.  ? ?Data Reviewed: ? ?BNP 1304 ?WBC 3.8k ?D.Dimer 17.2 ? ?CTA chest = no PE, does have a 2cm nodular area of infection vs inflammation vs possibly neoplasm at R lung [place], also some lymphadenopathy possibly just reactive.  Recd follow up after treatment.  Also small-mod volume ascites and contrast reflux suggesting R heart dysfunction. ? ?[place] RLE = no DVT ? ?Assessment and Plan: ?* Acute on chronic combined systolic and diastolic CHF (congestive heart failure)  ([place]) ?EF 30-35% ?See cards note ?CHF pathway ?Lasix ?Got '40mg'$  IV x1 in ED ?Will put on '40mg'$  IV daily for the moment (will need adjusting im sure) ?BMP Daily ?Strict intake and output ?Cont losartan for the moment

## 2021-12-17 NOTE — Progress Notes (Signed)
Patient arrived on unit. CHG bath complete. Purewick in place. Amiodarone infusing and continued. Dr. Alcario Drought at bedside. Telemetry alarming VTACH. Rhythm verified by this RN and Dr. Alcario Drought. Rhythm identified as atrial fibrillation with rapid ventricular response and pacemaker-telemetry monitor interfering causing the monitor to misidentify patient heart rhythm.  ? ?Patient has uncontrolled twitching of R eye. Status discussed with patient and family. 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  ? ?BP: 123/93 (102) ?HR: 102 ?SpO2: 95% RA ?RR:23 ? ? ?

## 2021-12-17 NOTE — Assessment & Plan Note (Addendum)
Patient was placed on IV amiodarone with improvement in rate control. Patient converted to sinus rhythm. ?Continue rate and rhythm control with metoprolol and amiodarone, anticoagulation with apixaban.  ? ?Sinus node dysfunction, pacer in place.  ?

## 2021-12-17 NOTE — Progress Notes (Signed)
Right lower extremity venous duplex has been completed. ?Preliminary results can be found in CV Proc through chart review.  ?Results were given to Paulita Cradle PA. ? ?12/17/21 2:19 PM ?Carlos Levering RVT   ?

## 2021-12-17 NOTE — Assessment & Plan Note (Addendum)
Continue blood pressure control with entresto and metoprolol.  ?Diuresis with furosemide.  ?

## 2021-12-17 NOTE — ED Notes (Signed)
Patient being placed on zoll pads as precaution.  MD going to call cardio ?

## 2021-12-17 NOTE — ED Provider Notes (Signed)
?[facility] ?Provider Note ? ?CSN: [id number] ?Arrival date & time: 12/17/21 1249 ? ?Chief Complaint(s) ?Shortness of Breath ? ?HPI ?Ashley [name] is a 56 y.o. female with PMH recurrent atrial flutter and ventricular tachycardia with pacemaker placement but no AICD placement, cardiomyopathy with EF 35 to 40% who presents emergency department for evaluation of shortness of breath.  She states that she has been compliant with her medication but over the last 3 days has had progressive worsening lower extremity edema and shortness of breath.  She states that she feels her heart intermittently fluttering.  She denies chest pain, abdominal pain, nausea, vomiting or other systemic symptoms. ? ? ?Past Medical History ?Past Medical History:  ?Diagnosis Date  ? Abdominal pain   ? ? chronic cholecystitis; admxn to [facility] 9/12 (CT, USN, HIDA done)  ? Atrial fibrillation ([place])   ? Cardiomyopathy   ? echo 9/12: mild LVH, EF 35-40%, mod MR (difficult to judge /post leaflet restricted), mod BAE, mod RVE, mod TR   ? Cardiomyopathy ([place])   ? a. echo 9/12: mild LVH, EF 35-40%, mod MR (difficult to judge /post leaflet restricted), mod BAE, mod RVE, mod TR. b. Echo 11/2011: mild LVH, EF 55-60%, s/p MV repair w/o sig MR/MS, mildly dilated RV/RA, PASP 6mHg. // AFL dx 10/2018 - Echo 10/2018: EF 30, diff HK, s/p MV repair with trivial MR, mean MV 7 but not c/w significant mitral stenosis, mild RAE, severe LAE, mod reduced RVSF   ? Carotid artery disease ([place]   ? a. Duplex 06/2015: 50% RECA, 1-39% BICA.  ? CHF (congestive heart failure) ([place]   ? Clotting disorder ([facility]   ? Coronary artery disease   ? a. s/p CABG 10/2011 - LIMA-LAD, SVG-PDA, SVG-OM, SVG-diagonal.  ? Dyslipidemia   ? Essential hypertension   ? Heart murmur   ? Hemifacial spasm   ? History of Doppler ultrasound   ? a. Carotid [place] [place] [place] 7/17: no hemodynamically significant ICA stenosis  ? Hyperlipidemia    ? Mitral regurgitation   ? a. s/p MV repair 10/2011 - on Coumadin for 3 months afterwards then d/c'd by surgery.  ? Recurrent upper respiratory infection (URI)   ? 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 S/P mitral valve repair 10/21/2011  ? Stroke ([facility] 1989  ? a. age 56- in setting of cigs and OCPs. //  b. s/p acute L parietal CVA >> [facility] [place] [place] ? Stroke ([facility] 01/2017  ? Tobacco abuse   ? ?Patient Active Problem List  ? Diagnosis Date Noted  ? Pacemaker 10/08/2019  ? Acquired thrombophilia ([place] 07/12/2019  ? Sick sinus syndrome ([place] 06/25/2019  ? A-fib ([place] 06/14/2019  ? Postprocedural cardiogenic shock, initial encounter ([place] 06/14/2019  ? Chronic combined systolic and diastolic heart failure ([place] 06/05/2019  ? Atrial fibrillation ([place] 03/22/2019  ? Encounter for therapeutic drug monitoring 03/22/2019  ? Persistent atrial fibrillation ([place]   ? History of stroke 01/01/2018  ? Former smoker 07/03/2017  ? B12 deficiency 06/21/2016  ? Hemifacial spasm 04/13/2016  ? Cerebrovascular accident (CVA) due to thrombosis of cerebral artery ([place]   ? Cardiomyopathy ([facility]   ? Essential hypertension   ? S/P mitral valve repair 10/21/2011  ? 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 Coronary artery disease 07/11/2011  ? CAD (coronary artery disease) 06/21/2011  ? Dyslipidemia 06/21/2011  ? Mitral regurgitation 06/21/2011  ? ?Home  Medication(s) ?Prior to Admission medications   ?Medication Sig Start Date End Date Taking? Authorizing Provider  ?acetaminophen (TYLENOL) 500 MG tablet Take 500-1,000 mg by mouth every 6 (six) hours as needed for headache.    [provider]  ?amiodarone (PACERONE) 200 MG tablet Take 1 tablet (200 mg total) by mouth 2 (two) times daily. 12/06/21   Shirley Friar, PA-C  ?amLODipine (NORVASC) 10 MG tablet Take 1 tablet (10 mg total) by mouth daily. 01/15/21   [name], MD  ?apixaban (ELIQUIS) 5 MG TABS tablet Take 1 tablet (5 mg total) by mouth 2 (two) times daily.  11/01/21   [name], MD  ?atorvastatin (LIPITOR) 80 MG tablet Take 1 tablet (80 mg total) by mouth daily. 01/22/21   [name], MD  ?diphenhydrAMINE (BENADRYL) 25 MG tablet Take 25 mg by mouth every 6 (six) hours as needed for allergies.    [provider]  ?famotidine (PEPCID) 20 MG tablet Take 20 mg by mouth daily as needed for indigestion.     [provider]  ?ferrous sulfate 325 (65 FE) MG tablet Take 1 tablet (325 mg total) by mouth daily with breakfast. 12/07/21   Shirley Friar, PA-C  ?furosemide (LASIX) 20 MG tablet TAKE 1 TABLET BY MOUTH AS NEEDED FOR SWELLING 12/16/21   [name], MD  ?losartan (COZAAR) 50 MG tablet Take 1.5 tablets (75 mg total) by mouth daily. 11/01/21   [name], MD  ?Saline 0.2 % SOLN Place 1 spray into both nostrils as needed (for congestion).     [provider]  ?vitamin B-12 (CYANOCOBALAMIN) 1000 MCG tablet Take 1 tablet (1,000 mcg total) by mouth daily. 06/21/16   Rosalin Hawking, MD  ?vitamin C (ASCORBIC ACID) 500 MG tablet Take 1,000 mg by mouth 2 (two) times daily.     [provider]  ?                                                                                                                                  ?Past Surgical History ?Past Surgical History:  ?Procedure Laterality Date  ? CARDIAC CATHETERIZATION    ? CARDIOVERSION N/A 01/25/2019  ? Procedure: CARDIOVERSION;  Surgeon: [name], MD;  Location: [place];  Service: Cardiovascular;  Laterality: N/A;  ? CARDIOVERSION N/A 06/14/2019  ? Procedure: CARDIOVERSION;  Surgeon: [name], MD;  Location: ;  Service: Cardiovascular;  Laterality: N/A;  ? [place]  ? twins  ? CORONARY ARTERY BYPASS GRAFT  10/21/2011  ? Procedure: CORONARY ARTERY BYPASS GRAFTING (CABG);  Surgeon: [name], MD;  Location: [place];  Service: Open Heart Surgery;  Laterality: N/A;  cabg x four, using right leg greater saphenous vein  harvested endoscopically  ? DENTAL SURGERY    ? MITRAL VALVE REPAIR  10/21/2011  ? Procedure: MITRAL VALVE REPAIR (MVR);  Surgeon: Valentina Gu  [name], MD;  Location: [place];  Service: Open Heart Surgery;  Laterality: N/A;  ? PACEMAKER IMPLANT N/A 06/25/2019  ? Procedure: PACEMAKER IMPLANT;  Surgeon: [name], MD;  Location: [place] CV LAB;  Service: Cardiovascular;  Laterality: N/A;  ? TEE WITHOUT CARDIOVERSION  06/30/2011  ? Procedure: TRANSESOPHAGEAL ECHOCARDIOGRAM (TEE);  Surgeon: [name], MD;  Location: [facility] ENDOSCOPY;  Service: Cardiovascular;  Laterality: N/A;  ? TONSILLECTOMY    ? TUBAL LIGATION    ? ?Family History ?Family History  ?Problem Relation Age of Onset  ? Hypertension Mother   ? Stroke Mother   ? Heart disease Father   ? COPD Father   ? Bone cancer Maternal Grandmother   ? Anesthesia problems Neg Hx   ? Hypotension Neg Hx   ? Malignant hyperthermia Neg Hx   ? Pseudochol deficiency Neg Hx   ? Colon cancer Neg Hx   ? ? ?Social History ?Social History  ? ?Tobacco Use  ? Smoking status: Former  ?  Packs/day: 1.00  ?  Years: 30.00  ?  Pack years: 30.00  ?  Types: Cigarettes  ?  Quit date: 05/07/2011  ?  Years since quitting: 10.6  ? Smokeless tobacco: Never  ?Vaping Use  ? Vaping Use: Never used  ?Substance Use Topics  ? Alcohol use: Yes  ?  Alcohol/week: 1.0 standard drink  ?  Types: 1 Glasses of wine per week  ?  Comment: 1 glass of red wine nightly  ? Drug use: No  ? ?Allergies ?Azithromycin, Latex, Spinach, and Tape ? ?Review of Systems ?Review of Systems  ?Respiratory:  Positive for shortness of breath.   ?Cardiovascular:  Positive for leg swelling.  ? ?Physical Exam ?Vital Signs  ?I have reviewed the triage vital signs ?BP 125/78   Pulse (!) 105   Temp 98.5 ?F (36.9 ?C) (Oral)   Resp (!) 21   Ht '5\' 8"'$  (1.727 m)   Wt 90.3 kg   LMP 05/18/2015 Comment: went for 3 week after skipping two months   SpO2 95%   BMI 30.26 kg/m?  ? ?Physical Exam ?Vitals and nursing note reviewed.   ?Constitutional:   ?   General: She is not in acute distress. ?   Appearance: She is well-developed.  ?HENT:  ?   Head: Normocephalic and atraumatic.  ?Eyes:  ?   Conjunctiva/sclera: Conjunctivae normal.  ?Cardiovascular:  ?

## 2021-12-18 DIAGNOSIS — R0600 Dyspnea, unspecified: Secondary | ICD-10-CM | POA: Diagnosis not present

## 2021-12-18 DIAGNOSIS — D72819 Decreased white blood cell count, unspecified: Secondary | ICD-10-CM | POA: Diagnosis not present

## 2021-12-18 DIAGNOSIS — Z950 Presence of cardiac pacemaker: Secondary | ICD-10-CM | POA: Diagnosis not present

## 2021-12-18 DIAGNOSIS — I4819 Other persistent atrial fibrillation: Secondary | ICD-10-CM | POA: Diagnosis not present

## 2021-12-18 DIAGNOSIS — E785 Hyperlipidemia, unspecified: Secondary | ICD-10-CM | POA: Diagnosis not present

## 2021-12-18 DIAGNOSIS — I083 Combined rheumatic disorders of mitral, aortic and tricuspid valves: Secondary | ICD-10-CM | POA: Diagnosis not present

## 2021-12-18 DIAGNOSIS — I509 Heart failure, unspecified: Secondary | ICD-10-CM

## 2021-12-18 DIAGNOSIS — I420 Dilated cardiomyopathy: Secondary | ICD-10-CM | POA: Diagnosis not present

## 2021-12-18 DIAGNOSIS — I2510 Atherosclerotic heart disease of native coronary artery without angina pectoris: Secondary | ICD-10-CM | POA: Diagnosis not present

## 2021-12-18 DIAGNOSIS — R188 Other ascites: Secondary | ICD-10-CM | POA: Diagnosis not present

## 2021-12-18 DIAGNOSIS — I5043 Acute on chronic combined systolic (congestive) and diastolic (congestive) heart failure: Secondary | ICD-10-CM

## 2021-12-18 DIAGNOSIS — Z951 Presence of aortocoronary bypass graft: Secondary | ICD-10-CM | POA: Diagnosis not present

## 2021-12-18 DIAGNOSIS — I471 Supraventricular tachycardia: Secondary | ICD-10-CM | POA: Diagnosis not present

## 2021-12-18 DIAGNOSIS — Z881 Allergy status to other antibiotic agents status: Secondary | ICD-10-CM | POA: Diagnosis not present

## 2021-12-18 DIAGNOSIS — I4892 Unspecified atrial flutter: Secondary | ICD-10-CM | POA: Diagnosis not present

## 2021-12-18 DIAGNOSIS — I255 Ischemic cardiomyopathy: Secondary | ICD-10-CM | POA: Diagnosis not present

## 2021-12-18 DIAGNOSIS — R011 Cardiac murmur, unspecified: Secondary | ICD-10-CM | POA: Diagnosis not present

## 2021-12-18 DIAGNOSIS — R000 Tachycardia, unspecified: Secondary | ICD-10-CM | POA: Diagnosis not present

## 2021-12-18 DIAGNOSIS — R0602 Shortness of breath: Secondary | ICD-10-CM | POA: Diagnosis not present

## 2021-12-18 DIAGNOSIS — Z823 Family history of stroke: Secondary | ICD-10-CM | POA: Diagnosis not present

## 2021-12-18 DIAGNOSIS — I495 Sick sinus syndrome: Secondary | ICD-10-CM | POA: Diagnosis not present

## 2021-12-18 DIAGNOSIS — D689 Coagulation defect, unspecified: Secondary | ICD-10-CM | POA: Diagnosis not present

## 2021-12-18 DIAGNOSIS — I10 Essential (primary) hypertension: Secondary | ICD-10-CM | POA: Diagnosis not present

## 2021-12-18 DIAGNOSIS — Z8249 Family history of ischemic heart disease and other diseases of the circulatory system: Secondary | ICD-10-CM | POA: Diagnosis not present

## 2021-12-18 DIAGNOSIS — I110 Hypertensive heart disease with heart failure: Secondary | ICD-10-CM | POA: Diagnosis not present

## 2021-12-18 DIAGNOSIS — I4891 Unspecified atrial fibrillation: Secondary | ICD-10-CM | POA: Diagnosis not present

## 2021-12-18 DIAGNOSIS — Z87891 Personal history of nicotine dependence: Secondary | ICD-10-CM | POA: Diagnosis not present

## 2021-12-18 DIAGNOSIS — I480 Paroxysmal atrial fibrillation: Secondary | ICD-10-CM | POA: Diagnosis not present

## 2021-12-18 DIAGNOSIS — Z8673 Personal history of transient ischemic attack (TIA), and cerebral infarction without residual deficits: Secondary | ICD-10-CM | POA: Diagnosis not present

## 2021-12-18 DIAGNOSIS — R9431 Abnormal electrocardiogram [ECG] [EKG]: Secondary | ICD-10-CM | POA: Diagnosis not present

## 2021-12-18 DIAGNOSIS — J189 Pneumonia, unspecified organism: Secondary | ICD-10-CM | POA: Diagnosis not present

## 2021-12-18 LAB — BASIC METABOLIC PANEL
Anion gap: 9 (ref 5–15)
BUN: 9 mg/dL (ref 6–20)
CO2: 26 mmol/L (ref 22–32)
Calcium: 9 mg/dL (ref 8.9–10.3)
Chloride: 105 mmol/L (ref 98–111)
Creatinine, Ser: 0.99 mg/dL (ref 0.44–1.00)
GFR, Estimated: >60 mL/min (ref >60)
Glucose, Bld: 91 mg/dL (ref 70–99)
Potassium: 3.4 mmol/L — ABNORMAL LOW (ref 3.5–5.1)
Sodium: 140 mmol/L (ref 135–145)

## 2021-12-18 LAB — HIV ANTIBODY (ROUTINE TESTING W REFLEX): HIV Screen 4th Generation wRfx: NONREACTIVE

## 2021-12-18 MED ORDER — POTASSIUM CHLORIDE CRYS ER 20 MEQ PO TBCR
40.0000 meq | EXTENDED_RELEASE_TABLET | Freq: Once | ORAL | Status: AC
  Administered 2021-12-18: 40 meq via ORAL
  Filled 2021-12-18: qty 2

## 2021-12-18 MED ORDER — SACUBITRIL-VALSARTAN 24-26 MG PO TABS
1.0000 | ORAL_TABLET | Freq: Two times a day (BID) | ORAL | Status: DC
  Administered 2021-12-19 – 2021-12-22 (×7): 1 via ORAL
  Filled 2021-12-18 (×7): qty 1

## 2021-12-18 MED ORDER — METOPROLOL SUCCINATE ER 25 MG PO TB24
12.5000 mg | ORAL_TABLET | Freq: Every day | ORAL | Status: DC
  Filled 2021-12-18: qty 1

## 2021-12-18 NOTE — Progress Notes (Signed)
? ?Progress Note ? ?Patient Name: Ashley [name] ?Date of Encounter: 12/18/2021 ? ?No Name [facility] Cardiologist: Dorris Carnes, MD  ? ?Subjective  ? ?Mild improvement of SOB ? ?Inpatient Medications  ?  ?Scheduled Meds: ? amLODipine  10 mg Oral Daily  ? apixaban  5 mg Oral BID  ? atorvastatin  80 mg Oral Daily  ? doxycycline  100 mg Oral Q12H  ? furosemide  40 mg Intravenous Daily  ? losartan  75 mg Oral Daily  ? sodium chloride flush  3 mL Intravenous Q12H  ? ?Continuous Infusions: ? sodium chloride    ? amiodarone 30 mg/hr (12/18/21 0522)  ? cefTRIAXone (ROCEPHIN)  IV Stopped (12/17/21 2100)  ? ?PRN Meds: ?sodium chloride, acetaminophen, ondansetron (ZOFRAN) IV, sodium chloride flush  ? ?Vital Signs  ?  ?Vitals:  ? 12/18/21 0245 12/18/21 0300 12/18/21 0437 12/18/21 0755  ?BP: (!) 124/110 96/72 126/61 103/64  ?Pulse: 90 (!) 114 96 73  ?Resp: '19 18 18 20  '$ ?Temp:   98.4 ?F (36.9 ?C) 99 ?F (37.2 ?C)  ?TempSrc:   Oral Oral  ?SpO2: 96% 95% 95% 97%  ?Weight:   87.5 kg   ?Height:      ? ? ?Intake/Output Summary (Last 24 hours) at 12/18/2021 0938 ?Last data filed at 12/18/2021 0439 ?Gross per 24 hour  ?Intake --  ?Output 1300 ml  ?Net -1300 ml  ? ? ?  12/18/2021  ?  4:37 AM 12/17/2021  ?  9:15 PM 12/17/2021  ?  8:57 PM  ?Last 3 Weights  ?Weight (lbs) 192 lb 12.8 oz 164 lb 8 oz 196 lb 4.8 oz  ?Weight (kg) 87.454 kg 74.617 kg 89.041 kg  ?   ? ?Telemetry  ?  ?Afib variable rates - Personally Reviewed ? ?ECG  ?  ?N/a - Personally Reviewed ? ?Physical Exam  ? ?GEN: No acute distress.   ?Neck: elevated JVD ?Cardiac: irreg, tachy ?Respiratory: crackles bilateral bases ?GI: Soft, nontender, non-distended  ?JY:NWGNF bilateral edema ?Neuro:  Nonfocal  ?Psych: Normal affect  ? ?Labs  ?  ?High Sensitivity Troponin:   ?Recent Labs  ?Lab 12/04/21 ?2141 12/05/21 ?0412  ?TROPONINIHS 21* 24*  ?   ?Chemistry ?Recent Labs  ?Lab 12/16/21 ?1644 12/17/21 ?1313 12/18/21 ?0335  ?NA 143 140 140  ?K 4.5 4.5 3.4*  ?CL 104 107 105  ?CO2 '21 25 26   '$ ?GLUCOSE 99 107* 91  ?BUN '11 12 9  '$ ?CREATININE 0.93 1.00 0.99  ?CALCIUM 9.5 9.4 9.0  ?GFRNONAA  --  >60 >60  ?ANIONGAP  --  8 9  ?  ?Lipids No results for input(s): CHOL, TRIG, HDL, LABVLDL, LDLCALC, CHOLHDL in the last 168 hours.  ?Hematology ?Recent Labs  ?Lab 12/17/21 ?1313  ?WBC 3.8*  ?RBC 4.02  ?HGB 11.1*  ?HCT 37.5  ?MCV 93.3  ?MCH 27.6  ?MCHC 29.6*  ?RDW 17.9*  ?PLT 227  ? ?Thyroid No results for input(s): TSH, FREET4 in the last 168 hours.  ?BNP ?Recent Labs  ?Lab 12/17/21 ?1313  ?BNP 1,304.4*  ?  ?DDimer  ?Recent Labs  ?Lab 12/17/21 ?1313  ?DDIMER 17.29*  ?  ? ?Radiology  ?  ?DG Chest 2 View ? ?Result Date: 12/17/2021 ?CLINICAL DATA:  Shortness of breath. EXAM: CHEST - 2 VIEW COMPARISON:  December 04, 2021. FINDINGS: Stable cardiomegaly. Status post coronary bypass graft. Left-sided pacemaker is unchanged in position. No acute pulmonary disease is noted. Bony thorax is unremarkable. IMPRESSION: No active cardiopulmonary disease. Electronically Signed  By: Marijo Conception M.D.   On: 12/17/2021 13:36  ? ?CT Angio Chest PE W/Cm &/Or Wo Cm ? ?Result Date: 12/17/2021 ?CLINICAL DATA:  Pulmonary embolism (PE) suspected, positive D-dimer. Atrial fibrillation with recent cardioversion EXAM: CT ANGIOGRAPHY CHEST WITH CONTRAST TECHNIQUE: Multidetector CT imaging of the chest was performed using the standard protocol during bolus administration of intravenous contrast. Multiplanar CT image reconstructions and MIPs were obtained to evaluate the vascular anatomy. RADIATION DOSE REDUCTION: This exam was performed according to the departmental dose-optimization program which includes automated exposure control, adjustment of the mA and/or kV according to patient size and/or use of iterative reconstruction technique. CONTRAST:  7m OMNIPAQUE IOHEXOL 350 MG/ML SOLN COMPARISON:  X-ray 12/17/2021, CT 10/25/2011 FINDINGS: Cardiovascular: Satisfactory opacification of the pulmonary arteries to the segmental level. No evidence of  pulmonary embolism. Thoracic aorta is nonaneurysmal. Atherosclerotic calcifications of the aorta and coronary arteries. Cardiomegaly. No pericardial effusion. Previous CABG. Left-sided implanted cardiac device. Mediastinum/Nodes: Enlarged mediastinal and right hilar lymph nodes. Reference nodes include 1.7 cm precarinal node (series 7, image 112) and 1.8 cm right hilar node (series 7, image 127). No axillary lymphadenopathy. Thyroid, trachea, and esophagus within normal limits. Lungs/Pleura: Focal area of nodularity at the right lung apex measuring up to 2.0 cm (series 8, image 20). Two additional areas of ground-glass nodularity within the more inferior aspect of the right upper lobe anteriorly (series 8, images 43 and 48). Mild lingular atelectasis. No pleural effusion or pneumothorax. Upper Abdomen: Small-moderate volume ascites within the included upper abdomen. There are a few subcentimeter low-density lesions within the liver, too small to characterize. Reflux of contrast into the IVC and hepatic veins. Musculoskeletal: No chest wall abnormality. No acute or significant osseous findings. Review of the MIP images confirms the above findings. IMPRESSION: 1. No evidence of pulmonary embolism. 2. Focal area of nodularity at the right lung apex measuring up to 2.0 cm with additional areas of ground-glass nodularity within the more inferior aspect of the right upper lobe. Findings are nonspecific and could represent an infectious or inflammatory process. Neoplasm is not excluded and short interval follow-up CT in 3 months is recommended following appropriate treatment. 3. Enlarged mediastinal and right hilar lymph nodes, nonspecific but may be reactive. Attention on follow-up. 4. Small-moderate volume ascites within the included upper abdomen. 5. Cardiomegaly with reflux of contrast into the IVC and hepatic veins, suggesting right heart dysfunction. 6. Aortic and coronary artery atherosclerosis (ICD10-I70.0).  Electronically Signed   By: NDavina PokeD.O.   On: 12/17/2021 19:10  ? ?VAS [place]OWER EXTREMITY VENOUS (DVT) (7a-7p) ? ?Result Date: 12/17/2021 ? Lower Venous DVT Study Patient Name:  C[place] Date of Exam:   12/17/2021 Medical Rec #: [id number]             Accession #:    [id number] of Birth: [birth year]            Patient Gender: F Patient Age:   54years [facility]      VAS [place]OWER EXTREMITY VENOUS (DVT) Referring Phys: GRACE LOEFFLER --------------------------------------------------------------------------------  Indications: Swelling.  Risk Factors: None identified. Limitations: Poor ultrasound/tissue interface. Comparison Study: No prior studies. Performing Technologist: GOliver HumRVT  Examination Guidelines: A complete evaluation includes B-mode imaging, spectral Doppler, color Doppler, and power Doppler as needed of all accessible portions of each vessel. Bilateral testing is considered an integral part of a complete examination. Limited examinations for reoccurring  indications may be performed as noted. The reflux portion of the exam is performed with the patient in reverse Trendelenburg.  +---------+---------------+---------+-----------+----------+--------------+ RIGHT    CompressibilityPhasicitySpontaneityPropertiesThrombus Aging +---------+---------------+---------+-----------+----------+--------------+ CFV      Full           Yes      Yes                                 +---------+---------------+---------+-----------+----------+--------------+ SFJ      Full                                                        +---------+---------------+---------+-----------+----------+--------------+ FV Prox  Full                                                        +---------+---------------+---------+-----------+----------+--------------+ FV Mid   Full                                                         +---------+---------------+---------+-----------+----------+--------------+ FV DistalFull                                                        +---------+---------------+---------+-----------+----------+--------------+ PFV      Full

## 2021-12-18 NOTE — Plan of Care (Signed)

## 2021-12-18 NOTE — Progress Notes (Signed)
?PROGRESS NOTE ? ? ? ?Ashley [name]  JOA:[id number] DOB: 1966/03/30 DOA: 12/17/2021 ?PCP: [name], MD  ? ? ?Brief Narrative:  ?56 year old with open mitral valve repair, CAD status post CABG, ischemic cardiomyopathy with ejection fraction 30 to 35%, hypertension, persistent A-fib, recent admission for fluid overload comes back to the hospital with progressive bilateral edema and shortness of breath.  In the emergency room hemodynamically stable.  She had A-fib with RVR, paced rhythm.  Evidence of fluid overload.  Admitted with cardiology consultation. ? ? ?Assessment & Plan: ?  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  Replace potassium. ?Started on Entresto and metoprolol today.  We will continue to uptitrate. ?Her fluid overload could be related to abnormal rhythm.  Cardiology following. ? ?Atrial fibrillation with RVR, persistent A-fib.  Atrial tachycardia. ?Sick sinus syndrome status post pacemaker. ?Patient continues to have multiple atrial tachycardia, recurrent A-fib. ?Loaded with amiodarone and currently remains on amiodarone infusion.  Therapeutic on Eliquis.  Followed by cardiology.  Continue amiodarone today. ? ?Abnormal CT scan of the chest: CT angiogram showed 2 cm area of apical abnormality on the lower end of right upper lobe.  No recent comparison scans. ?Empiric treatment with antibiotics.  We will send referral to pulmonary clinic for tomorrow follow-up. ? ? ?DVT prophylaxis:  ?apixaban (ELIQUIS) tablet 5 mg  ? ?Code Status: Full code ?Family Communication: None at the bedside ?Disposition Plan: Status is: Inpatient ?Remains inpatient appropriate because: Abnormal heart rhythm.  On vasoactive infusions. ?  ? ? ?Consultants:  ?Cardiology ? ?Procedures:  ?None ? ?Antimicrobials:  ?Rocephin and doxycycline 4/28--- ? ? ?Subjective: ?Patient seen and examined.  Feels much better  overnight.  She was able to at least get out of the bed to the bedside commode without losing breath.  Denies any chest pain or palpitations. ?Telemetry shows irregular rhythm, multiple atrial tachycardia.  Urine output 1300 mL overnight. ? ?Objective: ?Vitals:  ? 12/18/21 0245 12/18/21 0300 12/18/21 0437 12/18/21 0755  ?BP: (!) 124/110 96/72 126/61 103/64  ?Pulse: 90 (!) 114 96 73  ?Resp: '19 18 18 20  '$ ?Temp:   98.4 ?F (36.9 ?C) 99 ?F (37.2 ?C)  ?TempSrc:   Oral Oral  ?SpO2: 96% 95% 95% 97%  ?Weight:   87.5 kg   ?Height:      ? ? ?Intake/Output Summary (Last 24 hours) at 12/18/2021 1102 ?Last data filed at 12/18/2021 0439 ?Gross per 24 hour  ?Intake --  ?Output 1300 ml  ?Net -1300 ml  ? ?Filed Weights  ? 12/17/21 2057 12/17/21 2115 12/18/21 0437  ?Weight: 89 kg 74.6 kg 87.5 kg  ? ? ?Examination: ? ?General exam: Appears calm and comfortable on interview. ?Respiratory system: No added sounds.  Poor air entry at bases. ?Cardiovascular system: S1 & S2 heard, irregularly irregular.   ?Trace edema on the right leg.  No edema on the left leg.  Pacemaker in place. ?Gastrointestinal system: Abdomen is nondistended, soft and nontender. No organomegaly or masses felt. Normal bowel sounds heard. ?Central nervous system: Alert and oriented. No focal neurological deficits. ?Extremities: Symmetric 5 x 5 power. ?Skin: No rashes, lesions or ulcers ?Psychiatry: Judgement and insight appear normal. Mood & affect appropriate.  ? ? ? ?Data Reviewed: I have personally reviewed following labs and imaging studies ? ?CBC: ?Recent Labs  ?Lab 12/17/21 ?1313  ?WBC 3.8*  ?NEUTROABS 2.3  ?HGB 11.1*  ?HCT 37.5  ?MCV 93.3  ?  PLT 227  ? ?Basic Metabolic Panel: ?Recent Labs  ?Lab 12/16/21 ?1644 12/17/21 ?1313 12/18/21 ?0335  ?NA 143 140 140  ?K 4.5 4.5 3.4*  ?CL 104 107 105  ?CO2 '21 25 26  '$ ?GLUCOSE 99 107* 91  ?BUN '11 12 9  '$ ?CREATININE 0.93 1.00 0.99  ?CALCIUM 9.5 9.4 9.0  ? ?GFR: ?Estimated Creatinine Clearance: 72.5 mL/min (by C-G formula based on  SCr of 0.99 mg/dL). ?Liver Function Tests: ?No results for input(s): AST, ALT, ALKPHOS, BILITOT, PROT, ALBUMIN in the last 168 hours. ?No results for input(s): LIPASE, AMYLASE in the last 168 hours. ?No results for input(s): AMMONIA in the last 168 hours. ?Coagulation Profile: ?No results for input(s): INR, PROTIME in the last 168 hours. ?Cardiac Enzymes: ?No results for input(s): CKTOTAL, CKMB, CKMBINDEX, TROPONINI in the last 168 hours. ?BNP (last 3 results) ?No results for input(s): PROBNP in the last 8760 hours. ?HbA1C: ?No results for input(s): HGBA1C in the last 72 hours. ?CBG: ?Recent Labs  ?Lab 12/17/21 ?2123  ?GLUCAP 93  ? ?Lipid Profile: ?No results for input(s): CHOL, HDL, LDLCALC, TRIG, CHOLHDL, LDLDIRECT in the last 72 hours. ?Thyroid Function Tests: ?No results for input(s): TSH, T4TOTAL, FREET4, T3FREE, THYROIDAB in the last 72 hours. ?Anemia Panel: ?No results for input(s): VITAMINB12, FOLATE, FERRITIN, TIBC, IRON, RETICCTPCT in the last 72 hours. ?Sepsis Labs: ?No results for input(s): PROCALCITON, LATICACIDVEN in the last 168 hours. ? ?No results found for this or any previous visit (from the past 240 hour(s)).  ? ? ? ? ? ?Radiology Studies: ?DG Chest 2 View ? ?Result Date: 12/17/2021 ?CLINICAL DATA:  Shortness of breath. EXAM: CHEST - 2 VIEW COMPARISON:  December 04, 2021. FINDINGS: Stable cardiomegaly. Status post coronary bypass graft. Left-sided pacemaker is unchanged in position. No acute pulmonary disease is noted. Bony thorax is unremarkable. IMPRESSION: No active cardiopulmonary disease. Electronically Signed   By: Marijo Conception M.D.   On: 12/17/2021 13:36  ? ?CT Angio Chest PE W/Cm &/Or Wo Cm ? ?Result Date: 12/17/2021 ?CLINICAL DATA:  Pulmonary embolism (PE) suspected, positive D-dimer. Atrial fibrillation with recent cardioversion EXAM: CT ANGIOGRAPHY CHEST WITH CONTRAST TECHNIQUE: Multidetector CT imaging of the chest was performed using the standard protocol during bolus administration of  intravenous contrast. Multiplanar CT image reconstructions and MIPs were obtained to evaluate the vascular anatomy. RADIATION DOSE REDUCTION: This exam was performed according to the departmental dose-optimization program which includes automated exposure control, adjustment of the mA and/or kV according to patient size and/or use of iterative reconstruction technique. CONTRAST:  54m OMNIPAQUE IOHEXOL 350 MG/ML SOLN COMPARISON:  X-ray 12/17/2021, CT 10/25/2011 FINDINGS: Cardiovascular: Satisfactory opacification of the pulmonary arteries to the segmental level. No evidence of pulmonary embolism. Thoracic aorta is nonaneurysmal. Atherosclerotic calcifications of the aorta and coronary arteries. Cardiomegaly. No pericardial effusion. Previous CABG. Left-sided implanted cardiac device. Mediastinum/Nodes: Enlarged mediastinal and right hilar lymph nodes. Reference nodes include 1.7 cm precarinal node (series 7, image 112) and 1.8 cm right hilar node (series 7, image 127). No axillary lymphadenopathy. Thyroid, trachea, and esophagus within normal limits. Lungs/Pleura: Focal area of nodularity at the right lung apex measuring up to 2.0 cm (series 8, image 20). Two additional areas of ground-glass nodularity within the more inferior aspect of the right upper lobe anteriorly (series 8, images 43 and 48). Mild lingular atelectasis. No pleural effusion or pneumothorax. Upper Abdomen: Small-moderate volume ascites within the included upper abdomen. There are a few subcentimeter low-density lesions within the liver, too small to  characterize. Reflux of contrast into the IVC and hepatic veins. Musculoskeletal: No chest wall abnormality. No acute or significant osseous findings. Review of the MIP images confirms the above findings. IMPRESSION: 1. No evidence of pulmonary embolism. 2. Focal area of nodularity at the right lung apex measuring up to 2.0 cm with additional areas of ground-glass nodularity within the more inferior  aspect of the right upper lobe. Findings are nonspecific and could represent an infectious or inflammatory process. Neoplasm is not excluded and short interval follow-up CT in 3 months is recommended following a

## 2021-12-19 DIAGNOSIS — D72819 Decreased white blood cell count, unspecified: Secondary | ICD-10-CM | POA: Diagnosis not present

## 2021-12-19 DIAGNOSIS — R188 Other ascites: Secondary | ICD-10-CM | POA: Diagnosis not present

## 2021-12-19 DIAGNOSIS — I083 Combined rheumatic disorders of mitral, aortic and tricuspid valves: Secondary | ICD-10-CM | POA: Diagnosis not present

## 2021-12-19 DIAGNOSIS — I471 Supraventricular tachycardia: Secondary | ICD-10-CM | POA: Diagnosis not present

## 2021-12-19 DIAGNOSIS — I495 Sick sinus syndrome: Secondary | ICD-10-CM | POA: Diagnosis not present

## 2021-12-19 DIAGNOSIS — J189 Pneumonia, unspecified organism: Secondary | ICD-10-CM | POA: Diagnosis not present

## 2021-12-19 DIAGNOSIS — I480 Paroxysmal atrial fibrillation: Secondary | ICD-10-CM

## 2021-12-19 DIAGNOSIS — I4819 Other persistent atrial fibrillation: Secondary | ICD-10-CM | POA: Diagnosis not present

## 2021-12-19 DIAGNOSIS — R000 Tachycardia, unspecified: Secondary | ICD-10-CM

## 2021-12-19 DIAGNOSIS — D689 Coagulation defect, unspecified: Secondary | ICD-10-CM | POA: Diagnosis not present

## 2021-12-19 DIAGNOSIS — I420 Dilated cardiomyopathy: Secondary | ICD-10-CM | POA: Diagnosis not present

## 2021-12-19 DIAGNOSIS — I110 Hypertensive heart disease with heart failure: Secondary | ICD-10-CM | POA: Diagnosis not present

## 2021-12-19 DIAGNOSIS — I5043 Acute on chronic combined systolic (congestive) and diastolic (congestive) heart failure: Secondary | ICD-10-CM | POA: Diagnosis not present

## 2021-12-19 DIAGNOSIS — I4892 Unspecified atrial flutter: Secondary | ICD-10-CM | POA: Diagnosis not present

## 2021-12-19 LAB — BASIC METABOLIC PANEL
Anion gap: 10 (ref 5–15)
BUN: 11 mg/dL (ref 6–20)
CO2: 25 mmol/L (ref 22–32)
Calcium: 8.8 mg/dL — ABNORMAL LOW (ref 8.9–10.3)
Chloride: 104 mmol/L (ref 98–111)
Creatinine, Ser: 0.97 mg/dL (ref 0.44–1.00)
GFR, Estimated: >60 mL/min (ref >60)
Glucose, Bld: 114 mg/dL — ABNORMAL HIGH (ref 70–99)
Potassium: 3.6 mmol/L (ref 3.5–5.1)
Sodium: 139 mmol/L (ref 135–145)

## 2021-12-19 MED ORDER — FUROSEMIDE 10 MG/ML IJ SOLN: 40.0000 mg | Freq: Two times a day (BID) | INTRAMUSCULAR | Status: DC

## 2021-12-19 MED ORDER — DAPAGLIFLOZIN PROPANEDIOL 10 MG PO TABS
10.0000 mg | ORAL_TABLET | Freq: Every day | ORAL | Status: DC
  Administered 2021-12-19 – 2021-12-22 (×4): 10 mg via ORAL
  Filled 2021-12-19 (×4): qty 1

## 2021-12-19 MED ORDER — METOPROLOL TARTRATE 25 MG PO TABS
25.0000 mg | ORAL_TABLET | Freq: Three times a day (TID) | ORAL | Status: AC
  Administered 2021-12-19 – 2021-12-21 (×9): 25 mg via ORAL
  Filled 2021-12-19 (×9): qty 1

## 2021-12-19 MED ORDER — FUROSEMIDE 10 MG/ML IJ SOLN
60.0000 mg | Freq: Two times a day (BID) | INTRAMUSCULAR | Status: DC
  Administered 2021-12-19 – 2021-12-21 (×4): 60 mg via INTRAVENOUS
  Filled 2021-12-19 (×4): qty 6

## 2021-12-19 NOTE — Progress Notes (Signed)
? ?Progress Note ? ?Patient Name: Ashley [name] ?Date of Encounter: 12/19/2021 ? ?[facility] Cardiologist: Dorris Carnes, MD  ? ?Subjective  ? ?Ongoing SOB ? ?Inpatient Medications  ?  ?Scheduled Meds: ? apixaban  5 mg Oral BID  ? atorvastatin  80 mg Oral Daily  ? doxycycline  100 mg Oral Q12H  ? furosemide  40 mg Intravenous Daily  ? metoprolol tartrate  25 mg Oral Q8H  ? sacubitril-valsartan  1 tablet Oral BID  ? sodium chloride flush  3 mL Intravenous Q12H  ? ?Continuous Infusions: ? sodium chloride    ? amiodarone 30 mg/hr (12/19/21 0735)  ? cefTRIAXone (ROCEPHIN)  IV 2 g (12/18/21 2055)  ? ?PRN Meds: ?sodium chloride, acetaminophen, ondansetron (ZOFRAN) IV, sodium chloride flush  ? ?Vital Signs  ?  ?Vitals:  ? 12/19/21 0002 12/19/21 0151 12/19/21 0459 12/19/21 0500  ?BP: (!) 141/81 130/62 130/62 122/69  ?Pulse: 88 92 94 98  ?Resp: '20 20 20 20  '$ ?Temp: 98.1 ?F (36.7 ?C) 98 ?F (36.7 ?C) 97.7 ?F (36.5 ?C)   ?TempSrc: Oral Oral Oral   ?SpO2: 100% 92% 91% (!) 89%  ?Weight: 87.7 kg     ?Height:      ? ? ?Intake/Output Summary (Last 24 hours) at 12/19/2021 1031 ?Last data filed at 12/19/2021 0930 ?Gross per 24 hour  ?Intake 1558.81 ml  ?Output 1300 ml  ?Net 258.81 ml  ? ? ?  12/19/2021  ? 12:02 AM 12/18/2021  ?  4:37 AM 12/17/2021  ?  9:15 PM  ?Last 3 Weights  ?Weight (lbs) 193 lb 4.8 oz 192 lb 12.8 oz 164 lb 8 oz  ?Weight (kg) 87.68 kg 87.454 kg 74.617 kg  ?   ? ?Telemetry  ?  ?Afib variable rates - Personally Reviewed ? ?ECG  ?  ?N/a - Personally Reviewed ? ?Physical Exam  ? ?GEN: No acute distress.   ?Neck: + JVD ?Cardiac: irreg ?Respiratory: Clear to auscultation bilaterally. ?GI: Soft, nontender, non-distended  ?MS: No edema; No deformity. ?Neuro:  Nonfocal  ?Psych: Normal affect  ? ?Labs  ?  ?High Sensitivity Troponin:   ?Recent Labs  ?Lab 12/04/21 ?2141 12/05/21 ?0412  ?TROPONINIHS 21* 24*  ?   ?Chemistry ?Recent Labs  ?Lab 12/17/21 ?1313 12/18/21 ?0335 12/19/21 ?0250  ?NA 140 140 139  ?K 4.5 3.4* 3.6  ?CL  107 105 104  ?CO2 '25 26 25  '$ ?GLUCOSE 107* 91 114*  ?BUN '12 9 11  '$ ?CREATININE 1.00 0.99 0.97  ?CALCIUM 9.4 9.0 8.8*  ?GFRNONAA >60 >60 >60  ?ANIONGAP '8 9 10  '$ ?  ?Lipids No results for input(s): CHOL, TRIG, HDL, LABVLDL, LDLCALC, CHOLHDL in the last 168 hours.  ?Hematology ?Recent Labs  ?Lab 12/17/21 ?1313  ?WBC 3.8*  ?RBC 4.02  ?HGB 11.1*  ?HCT 37.5  ?MCV 93.3  ?MCH 27.6  ?MCHC 29.6*  ?RDW 17.9*  ?PLT 227  ? ?Thyroid No results for input(s): TSH, FREET4 in the last 168 hours.  ?BNP ?Recent Labs  ?Lab 12/17/21 ?1313  ?BNP 1,304.4*  ?  ?DDimer  ?Recent Labs  ?Lab 12/17/21 ?1313  ?DDIMER 17.29*  ?  ? ?Radiology  ?  ?DG Chest 2 View ? ?Result Date: 12/17/2021 ?CLINICAL DATA:  Shortness of breath. EXAM: CHEST - 2 VIEW COMPARISON:  December 04, 2021. FINDINGS: Stable cardiomegaly. Status post coronary bypass graft. Left-sided pacemaker is unchanged in position. No acute pulmonary disease is noted. Bony thorax is unremarkable. IMPRESSION: No active cardiopulmonary disease. Electronically Signed  By: Marijo Conception M.D.   On: 12/17/2021 13:36  ? ?CT Angio Chest PE W/Cm &/Or Wo Cm ? ?Result Date: 12/17/2021 ?CLINICAL DATA:  Pulmonary embolism (PE) suspected, positive D-dimer. Atrial fibrillation with recent cardioversion EXAM: CT ANGIOGRAPHY CHEST WITH CONTRAST TECHNIQUE: Multidetector CT imaging of the chest was performed using the standard protocol during bolus administration of intravenous contrast. Multiplanar CT image reconstructions and MIPs were obtained to evaluate the vascular anatomy. RADIATION DOSE REDUCTION: This exam was performed according to the departmental dose-optimization program which includes automated exposure control, adjustment of the mA and/or kV according to patient size and/or use of iterative reconstruction technique. CONTRAST:  63m OMNIPAQUE IOHEXOL 350 MG/ML SOLN COMPARISON:  X-ray 12/17/2021, CT 10/25/2011 FINDINGS: Cardiovascular: Satisfactory opacification of the pulmonary arteries to the  segmental level. No evidence of pulmonary embolism. Thoracic aorta is nonaneurysmal. Atherosclerotic calcifications of the aorta and coronary arteries. Cardiomegaly. No pericardial effusion. Previous CABG. Left-sided implanted cardiac device. Mediastinum/Nodes: Enlarged mediastinal and right hilar lymph nodes. Reference nodes include 1.7 cm precarinal node (series 7, image 112) and 1.8 cm right hilar node (series 7, image 127). No axillary lymphadenopathy. Thyroid, trachea, and esophagus within normal limits. Lungs/Pleura: Focal area of nodularity at the right lung apex measuring up to 2.0 cm (series 8, image 20). Two additional areas of ground-glass nodularity within the more inferior aspect of the right upper lobe anteriorly (series 8, images 43 and 48). Mild lingular atelectasis. No pleural effusion or pneumothorax. Upper Abdomen: Small-moderate volume ascites within the included upper abdomen. There are a few subcentimeter low-density lesions within the liver, too small to characterize. Reflux of contrast into the IVC and hepatic veins. Musculoskeletal: No chest wall abnormality. No acute or significant osseous findings. Review of the MIP images confirms the above findings. IMPRESSION: 1. No evidence of pulmonary embolism. 2. Focal area of nodularity at the right lung apex measuring up to 2.0 cm with additional areas of ground-glass nodularity within the more inferior aspect of the right upper lobe. Findings are nonspecific and could represent an infectious or inflammatory process. Neoplasm is not excluded and short interval follow-up CT in 3 months is recommended following appropriate treatment. 3. Enlarged mediastinal and right hilar lymph nodes, nonspecific but may be reactive. Attention on follow-up. 4. Small-moderate volume ascites within the included upper abdomen. 5. Cardiomegaly with reflux of contrast into the IVC and hepatic veins, suggesting right heart dysfunction. 6. Aortic and coronary artery  atherosclerosis (ICD10-I70.0). Electronically Signed   By: NDavina PokeD.O.   On: 12/17/2021 19:10  ? ?VAS [place]OWER EXTREMITY VENOUS (DVT) (7a-7p) ? ?Result Date: 12/17/2021 ? Lower Venous DVT Study Patient Name:  CStafford Courthouse Date of Exam:   12/17/2021 Medical Rec #: [id number]             Accession #:    [id number] of Birth: [birth year]            Patient Gender: F Patient Age:   89years [facility]      VAS [place]OWER EXTREMITY VENOUS (DVT) Referring Phys: GRACE LOEFFLER --------------------------------------------------------------------------------  Indications: Swelling.  Risk Factors: None identified. Limitations: Poor ultrasound/tissue interface. Comparison Study: No prior studies. Performing Technologist: GOliver HumRVT  Examination Guidelines: A complete evaluation includes B-mode imaging, spectral Doppler, color Doppler, and power Doppler as needed of all accessible portions of each vessel. Bilateral testing is considered an integral part of a complete examination. Limited examinations for reoccurring  indications may be performed as noted. The reflux portion of the exam is performed with the patient in reverse Trendelenburg.  +---------+---------------+---------+-----------+----------+--------------+ RIGHT    CompressibilityPhasicitySpontaneityPropertiesThrombus Aging +---------+---------------+---------+-----------+----------+--------------+ CFV      Full           Yes      Yes                                 +---------+---------------+---------+-----------+----------+--------------+ SFJ      Full                                                        +---------+---------------+---------+-----------+----------+--------------+ FV Prox  Full                                                        +---------+---------------+---------+-----------+----------+--------------+ FV Mid   Full                                                         +---------+---------------+---------+-----------+----------+--------------+ FV DistalFull                                                        +---------+---------------+---------+-----------+----------+--------------+ PFV

## 2021-12-19 NOTE — Plan of Care (Signed)
Patient had episode of BBB,and vtach on tele according to central monitor, On assessment patient denies any sob or chest pain no signs of acute distress.  Kristopher Oppenheim DO notified, no new orders.Patient continued to monitor. ?

## 2021-12-19 NOTE — Progress Notes (Signed)
CSW acknowledges consult for SNF/HH. The patient will require PT/OT evaluations. TOC will assist with disposition planning once the evaluations have been completed.  °  °TOC will continue to follow.    °

## 2021-12-19 NOTE — Consult Note (Signed)
?Cardiology Consultation:  ? ?Patient ID: Ashley [name] ?MRN: [id number]; DOB: [birth year] ? ?Admit date: 12/17/2021 ?Date of Consult: 12/19/2021 ? ?PCP:  Ashley Rakes, MD ?  ?[facility] Providers ?Cardiologist:  Dorris Carnes, MD  ?Electrophysiologist:  Virl Axe, MD     ? ? ?Patient Profile:  ? ?Ashley [name] is a 56 y.o. female with a hx of atrial fibrillation, coronary artery disease, mitral stenosis, chronic systolic heart failure who is being seen 12/19/2021 for the evaluation of atrial fibrillation at the request of Carlyle Dolly. ? ?History of Present Illness:  ? ?[name] she was seen in cardiology clinic and was noted to be tachycardic.  She was sent to the emergency room.  She was found to be volume overloaded in the emergency room.  She is currently on amiodarone 200 mg twice daily.  She remains volume overloaded today.  She feels well and is without major complaint other than mild shortness of breath.  She is unaware of tachypalpitations. ? ? ?Past Medical History:  ?Diagnosis Date  ? Abdominal pain   ? ? chronic cholecystitis; admxn to [facility] 9/12 (CT, USN, HIDA done)  ? Atrial fibrillation ([place])   ? Cardiomyopathy   ? echo 9/12: mild LVH, EF 35-40%, mod MR (difficult to judge /post leaflet restricted), mod BAE, mod RVE, mod TR   ? Cardiomyopathy ([place])   ? a. echo 9/12: mild LVH, EF 35-40%, mod MR (difficult to judge /post leaflet restricted), mod BAE, mod RVE, mod TR. b. Echo 11/2011: mild LVH, EF 55-60%, s/p MV repair w/o sig MR/MS, mildly dilated RV/RA, PASP 61mHg. // AFL dx 10/2018 - Echo 10/2018: EF 30, diff HK, s/p MV repair with trivial MR, mean MV 7 but not c/w significant mitral stenosis, mild RAE, severe LAE, mod reduced RVSF   ? Carotid artery disease ([place]   ? a. Duplex 06/2015: 50% RECA, 1-39% BICA.  ? CHF (congestive heart failure) ([place]   ? Clotting disorder ([place]   ? Coronary artery disease   ? a. s/p CABG 10/2011 - LIMA-LAD, SVG-PDA, SVG-OM, SVG-diagonal.  ?  Dyslipidemia   ? Essential hypertension   ? Heart murmur   ? Hemifacial spasm   ? History of Doppler ultrasound   ? [facility] [facility] [place]: no hemodynamically significant ICA stenosis  ? Hyperlipidemia   ? Mitral regurgitation   ? a. s/p MV repair 10/2011 - on Coumadin for 3 months afterwards then d/c'd by surgery.  ? Recurrent upper respiratory infection (URI)   ? 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 S/P mitral valve repair 10/21/2011  ? Stroke ([facility] 1989  ? a. age 56- in setting of cigs and OCPs. //  b. s/p acute L parietal CVA >> [place] [facility] [place] [facility] [place] ? Stroke ([facility] 01/2017  ? Tobacco abuse   ? ? ?Past Surgical History:  ?Procedure Laterality Date  ? CARDIAC CATHETERIZATION    ? CARDIOVERSION N/A 01/25/2019  ? Procedure: CARDIOVERSION;  Surgeon: HPixie Casino MD;  [place]  Service: Cardiovascular;  Laterality: N/A;  ? CARDIOVERSION N/A 06/14/2019  ? Procedure: CARDIOVERSION;  Surgeon: HPixie Casino MD;  [place]  Service: Cardiovascular;  Laterality: N/A;  ? [place] ? [place]  ? CORONARY ARTERY BYPASS GRAFT  10/21/2011  ? Procedure: CORONARY ARTERY BYPASS GRAFTING (CABG);  Surgeon: CRexene Alberts MD;  [place]  Service: Open  Heart Surgery;  Laterality: N/A;  cabg x four, using right leg greater saphenous vein harvested endoscopically  ? DENTAL SURGERY    ? MITRAL VALVE REPAIR  10/21/2011  ? Procedure: MITRAL VALVE REPAIR (MVR);  Surgeon: [name], MD;  Location: [place];  Service: Open Heart Surgery;  Laterality: N/A;  ? PACEMAKER IMPLANT N/A 06/25/2019  ? Procedure: PACEMAKER IMPLANT;  Surgeon: [name], MD;  Location: [place] CV LAB;  Service: Cardiovascular;  Laterality: N/A;  ? TEE WITHOUT CARDIOVERSION  06/30/2011  ? Procedure: TRANSESOPHAGEAL ECHOCARDIOGRAM (TEE);  Surgeon: [name], MD;  Location: [facility] ENDOSCOPY;  Service: Cardiovascular;  Laterality: N/A;  ? TONSILLECTOMY    ?  TUBAL LIGATION    ?  ? ?Home Medications:  ?Prior to Admission medications   ?Medication Sig Start Date End Date Taking? Authorizing Provider  ?acetaminophen (TYLENOL) 500 MG tablet Take 500-1,000 mg by mouth every 6 (six) hours as needed for headache.   Yes [provider]  ?amiodarone (PACERONE) 200 MG tablet Take 1 tablet (200 mg total) by mouth 2 (two) times daily. 12/06/21  Yes Shirley Friar, PA-C  ?amLODipine (NORVASC) 10 MG tablet Take 1 tablet (10 mg total) by mouth daily. 01/15/21  Yes [name], MD  ?apixaban (ELIQUIS) 5 MG TABS tablet Take 1 tablet (5 mg total) by mouth 2 (two) times daily. 11/01/21  Yes [name], MD  ?atorvastatin (LIPITOR) 80 MG tablet Take 1 tablet (80 mg total) by mouth daily. 01/22/21  Yes [name], MD  ?diphenhydrAMINE (BENADRYL) 25 MG tablet Take 25 mg by mouth every 6 (six) hours as needed for allergies.   Yes [provider]  ?famotidine (PEPCID) 20 MG tablet Take 20 mg by mouth daily as needed for indigestion.    Yes [provider]  ?ferrous sulfate 325 (65 FE) MG tablet Take 1 tablet (325 mg total) by mouth daily with breakfast. 12/07/21  Yes Tillery, Satira Mccallum, PA-C  ?furosemide (LASIX) 20 MG tablet TAKE 1 TABLET BY MOUTH AS NEEDED FOR SWELLING ?Patient taking differently: Take 20 mg by mouth daily as needed for fluid. 12/16/21  Yes [name], MD  ?losartan (COZAAR) 50 MG tablet Take 1.5 tablets (75 mg total) by mouth daily. 11/01/21  Yes [name], MD  ?Saline 0.2 % SOLN Place 1 spray into both nostrils daily as needed (for congestion).   Yes [provider]  ?vitamin B-12 (CYANOCOBALAMIN) 1000 MCG tablet Take 1 tablet (1,000 mcg total) by mouth daily. 06/21/16  Yes Rosalin Hawking, MD  ?vitamin C (ASCORBIC ACID) 500 MG tablet Take 1,000 mg by mouth 2 (two) times daily.    Yes [provider]  ? ? ?Inpatient Medications: ?Scheduled Meds: ? apixaban  5 mg Oral BID  ? atorvastatin  80 mg Oral Daily   ? doxycycline  100 mg Oral Q12H  ? furosemide  40 mg Intravenous Daily  ? metoprolol succinate  12.5 mg Oral Daily  ? sacubitril-valsartan  1 tablet Oral BID  ? sodium chloride flush  3 mL Intravenous Q12H  ? ?Continuous Infusions: ? sodium chloride    ? amiodarone 30 mg/hr (12/19/21 0735)  ? cefTRIAXone (ROCEPHIN)  IV 2 g (12/18/21 2055)  ? ?PRN Meds: ?sodium chloride, acetaminophen, ondansetron (ZOFRAN) IV, sodium chloride flush ? ?Allergies:    ?Allergies  ?Allergen Reactions  ? Azithromycin Nausea Only  ? Latex Rash  ? Spinach Hives  ? Tape Other (See Comments)  ?  PLEASE USE PAPER TAPE!! AFFECTS SKIN BADLY!!!!  ? ? ?Social History:   ?Social History  ? ?Socioeconomic History  ? Marital status: Married  ?  Spouse name: Not on file  ? Number of children: 16  ? Years of education: 2  ? Highest education level: Not on file  ?Occupational History  ? Occupation: Payroll/Paperwork  ?  Comment: 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  ?Tobacco Use  ? Smoking status: Former  ?  Packs/day: 1.00  ?  Years: 30.00  ?  Pack years: 30.00  ?  Types: Cigarettes  ?  Quit date: 05/07/2011  ?  Years since quitting: 10.6  ? Smokeless tobacco: Never  ?Vaping Use  ? Vaping Use: Never used  ?Substance and Sexual Activity  ? Alcohol use: Yes  ?  Alcohol/week: 1.0 standard drink  ?  Types: 1 Glasses of wine per week  ?  Comment: 1 glass of red wine nightly  ? Drug use: No  ? Sexual activity: Yes  ?Other Topics Concern  ? Not on file  ?Social History Narrative  ? Lives at home with husband and son.  ? Right-handed.  ? 2 cups caffeine daily.  ? ?Social Determinants of Health  ? ?Financial Resource Strain: Not on file  ?Food Insecurity: Not on file  ?Transportation Needs: Not on file  ?Physical Activity: Not on file  ?Stress: Not on file  ?Social Connections: Not on file  ?Intimate Partner Violence: Not on file  ?  ?Family History:   ? ?Family History  ?Problem Relation Age of Onset  ? Hypertension Mother   ? Stroke Mother   ? Heart disease  Father   ? COPD Father   ? Bone cancer Maternal Grandmother   ? Anesthesia problems Neg Hx   ? Hypotension Neg Hx   ? Malignant hyperthermia Neg Hx   ? Pseudochol deficiency Neg Hx   ? Colon cancer Neg Hx   ?  ? ?R

## 2021-12-19 NOTE — Plan of Care (Signed)

## 2021-12-19 NOTE — Progress Notes (Signed)
?PROGRESS NOTE ? ? ? ?Ashley [name]  MWN:[id number] DOB: Oct 19, 1965 DOA: 12/17/2021 ?PCP: [name], MD  ? ? ?Brief Narrative:  ?56 year old with open mitral valve repair, CAD status post CABG, ischemic cardiomyopathy with ejection fraction 30 to 35%, hypertension, persistent A-fib, recent admission for fluid overload comes back to the hospital with progressive bilateral edema and shortness of breath.  In the emergency room hemodynamically stable.  She had A-fib with RVR, paced rhythm.  Evidence of fluid overload.  Admitted with cardiology consultation. ? ? ?Assessment & Plan: ?  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  Electrolytes are adequate. ?Started on Entresto and metoprolol .   ?Her fluid overload could be related to abnormal rhythm.  Cardiology following. ? ?Atrial fibrillation with RVR, persistent A-fib.  Atrial tachycardia. ?Sick sinus syndrome status post pacemaker. ?Remains rate controlled a flutter.  Seen by EP cardiology.  Currently remains on amiodarone infusion.  Therapeutic on Eliquis.  Started on metoprolol every 8 hours. ? ?Abnormal CT scan of the chest: CT angiogram showed 2 cm area of apical abnormality on the lower end of right upper lobe.  No recent comparison scans. ?Empiric treatment with antibiotics.  Referral sent to pulmonary tumor clinic for follow-up. ? ? ?DVT prophylaxis:  ?apixaban (ELIQUIS) tablet 5 mg  ? ?Code Status: Full code ?Family Communication: Daughter on the phone. ?Disposition Plan: Status is: Inpatient ?Remains inpatient appropriate because: Abnormal heart rhythm.  On vasoactive infusions. ?  ? ? ?Consultants:  ?Cardiology ? ?Procedures:  ?None ? ?Antimicrobials:  ?Rocephin and doxycycline 4/28--- ? ? ?Subjective: ? ?Patient seen and examined.  No overnight events.  Denies any chest pain or palpitations.  Was able to get to the bedside commode without losing  breath. ? ?Objective: ?Vitals:  ? 12/19/21 0151 12/19/21 0459 12/19/21 0500 12/19/21 1128  ?BP: 130/62 130/62 122/69 (!) 138/59  ?Pulse: 92 94 98 79  ?Resp: '20 20 20 17  '$ ?Temp: 98 ?F (36.7 ?C) 97.7 ?F (36.5 ?C)  98 ?F (36.7 ?C)  ?TempSrc: Oral Oral  Oral  ?SpO2: 92% 91% (!) 89% 94%  ?Weight:      ?Height:      ? ? ?Intake/Output Summary (Last 24 hours) at 12/19/2021 1202 ?Last data filed at 12/19/2021 0930 ?Gross per 24 hour  ?Intake 1558.81 ml  ?Output 1300 ml  ?Net 258.81 ml  ? ?Filed Weights  ? 12/17/21 2115 12/18/21 0437 12/19/21 0002  ?Weight: 74.6 kg 87.5 kg 87.7 kg  ? ? ?Examination: ? ?General exam: Appears calm and comfortable.  Mostly on room air. ?Respiratory system: No added sounds.  Poor air entry at bases. ?Cardiovascular system: S1 & S2 heard, irregularly irregular.   ?No edema.  Pacemaker in place. ?Gastrointestinal system: Abdomen is nondistended, soft and nontender. No organomegaly or masses felt. Normal bowel sounds heard. ?Central nervous system: Alert and oriented. No focal neurological deficits. ?Extremities: Symmetric 5 x 5 power. ?Skin: No rashes, lesions or ulcers ?Psychiatry: Judgement and insight appear normal. Mood & affect appropriate.  ? ? ? ?Data Reviewed: I have personally reviewed following labs and imaging studies ? ?CBC: ?Recent Labs  ?Lab 12/17/21 ?1313  ?WBC 3.8*  ?NEUTROABS 2.3  ?HGB 11.1*  ?HCT 37.5  ?MCV 93.3  ?PLT 227  ? ?Basic Metabolic Panel: ?Recent Labs  ?Lab 12/16/21 ?1644 12/17/21 ?1313 12/18/21 ?0335 12/19/21 ?0250  ?NA 143 140 140 139  ?K 4.5 4.5 3.4* 3.6  ?CL  104 107 105 104  ?CO2 '21 25 26 25  '$ ?GLUCOSE 99 107* 91 114*  ?BUN '11 12 9 11  '$ ?CREATININE 0.93 1.00 0.99 0.97  ?CALCIUM 9.5 9.4 9.0 8.8*  ? ?GFR: ?Estimated Creatinine Clearance: 74.1 mL/min (by C-G formula based on SCr of 0.97 mg/dL). ?Liver Function Tests: ?No results for input(s): AST, ALT, ALKPHOS, BILITOT, PROT, ALBUMIN in the last 168 hours. ?No results for input(s): LIPASE, AMYLASE in the last 168  hours. ?No results for input(s): AMMONIA in the last 168 hours. ?Coagulation Profile: ?No results for input(s): INR, PROTIME in the last 168 hours. ?Cardiac Enzymes: ?No results for input(s): CKTOTAL, CKMB, CKMBINDEX, TROPONINI in the last 168 hours. ?BNP (last 3 results) ?No results for input(s): PROBNP in the last 8760 hours. ?HbA1C: ?No results for input(s): HGBA1C in the last 72 hours. ?CBG: ?Recent Labs  ?Lab 12/17/21 ?2123  ?GLUCAP 93  ? ?Lipid Profile: ?No results for input(s): CHOL, HDL, LDLCALC, TRIG, CHOLHDL, LDLDIRECT in the last 72 hours. ?Thyroid Function Tests: ?No results for input(s): TSH, T4TOTAL, FREET4, T3FREE, THYROIDAB in the last 72 hours. ?Anemia Panel: ?No results for input(s): VITAMINB12, FOLATE, FERRITIN, TIBC, IRON, RETICCTPCT in the last 72 hours. ?Sepsis Labs: ?No results for input(s): PROCALCITON, LATICACIDVEN in the last 168 hours. ? ?No results found for this or any previous visit (from the past 240 hour(s)).  ? ? ? ? ? ?Radiology Studies: ?DG Chest 2 View ? ?Result Date: 12/17/2021 ?CLINICAL DATA:  Shortness of breath. EXAM: CHEST - 2 VIEW COMPARISON:  December 04, 2021. FINDINGS: Stable cardiomegaly. Status post coronary bypass graft. Left-sided pacemaker is unchanged in position. No acute pulmonary disease is noted. Bony thorax is unremarkable. IMPRESSION: No active cardiopulmonary disease. Electronically Signed   By: Marijo Conception M.D.   On: 12/17/2021 13:36  ? ?CT Angio Chest PE W/Cm &/Or Wo Cm ? ?Result Date: 12/17/2021 ?CLINICAL DATA:  Pulmonary embolism (PE) suspected, positive D-dimer. Atrial fibrillation with recent cardioversion EXAM: CT ANGIOGRAPHY CHEST WITH CONTRAST TECHNIQUE: Multidetector CT imaging of the chest was performed using the standard protocol during bolus administration of intravenous contrast. Multiplanar CT image reconstructions and MIPs were obtained to evaluate the vascular anatomy. RADIATION DOSE REDUCTION: This exam was performed according to the  departmental dose-optimization program which includes automated exposure control, adjustment of the mA and/or kV according to patient size and/or use of iterative reconstruction technique. CONTRAST:  59m OMNIPAQUE IOHEXOL 350 MG/ML SOLN COMPARISON:  X-ray 12/17/2021, CT 10/25/2011 FINDINGS: Cardiovascular: Satisfactory opacification of the pulmonary arteries to the segmental level. No evidence of pulmonary embolism. Thoracic aorta is nonaneurysmal. Atherosclerotic calcifications of the aorta and coronary arteries. Cardiomegaly. No pericardial effusion. Previous CABG. Left-sided implanted cardiac device. Mediastinum/Nodes: Enlarged mediastinal and right hilar lymph nodes. Reference nodes include 1.7 cm precarinal node (series 7, image 112) and 1.8 cm right hilar node (series 7, image 127). No axillary lymphadenopathy. Thyroid, trachea, and esophagus within normal limits. Lungs/Pleura: Focal area of nodularity at the right lung apex measuring up to 2.0 cm (series 8, image 20). Two additional areas of ground-glass nodularity within the more inferior aspect of the right upper lobe anteriorly (series 8, images 43 and 48). Mild lingular atelectasis. No pleural effusion or pneumothorax. Upper Abdomen: Small-moderate volume ascites within the included upper abdomen. There are a few subcentimeter low-density lesions within the liver, too small to characterize. Reflux of contrast into the IVC and hepatic veins. Musculoskeletal: No chest wall abnormality. No acute or significant osseous findings. Review of  the MIP images confirms the above findings. IMPRESSION: 1. No evidence of pulmonary embolism. 2. Focal area of nodularity at the right lung apex measuring up to 2.0 cm with additional areas of ground-glass nodularity within the more inferior aspect of the right upper lobe. Findings are nonspecific and could represent an infectious or inflammatory process. Neoplasm is not excluded and short interval follow-up CT in 3 months  is recommended following appropriate treatment. 3. Enlarged mediastinal and right hilar lymph nodes, nonspecific but may be reactive. Attention on follow-up. 4. Small-moderate volume ascites within the included upper

## 2021-12-20 ENCOUNTER — Other Ambulatory Visit (HOSPITAL_COMMUNITY): Payer: Self-pay

## 2021-12-20 DIAGNOSIS — I4891 Unspecified atrial fibrillation: Secondary | ICD-10-CM | POA: Diagnosis not present

## 2021-12-20 DIAGNOSIS — I10 Essential (primary) hypertension: Secondary | ICD-10-CM | POA: Diagnosis not present

## 2021-12-20 DIAGNOSIS — I5043 Acute on chronic combined systolic (congestive) and diastolic (congestive) heart failure: Secondary | ICD-10-CM | POA: Diagnosis not present

## 2021-12-20 DIAGNOSIS — Z9889 Other specified postprocedural states: Secondary | ICD-10-CM | POA: Diagnosis not present

## 2021-12-20 DIAGNOSIS — I2510 Atherosclerotic heart disease of native coronary artery without angina pectoris: Secondary | ICD-10-CM

## 2021-12-20 LAB — BASIC METABOLIC PANEL
Anion gap: 9 (ref 5–15)
BUN: 13 mg/dL (ref 6–20)
CO2: 26 mmol/L (ref 22–32)
Calcium: 8.9 mg/dL (ref 8.9–10.3)
Chloride: 105 mmol/L (ref 98–111)
Creatinine, Ser: 1.04 mg/dL — ABNORMAL HIGH (ref 0.44–1.00)
GFR, Estimated: >60 mL/min (ref >60)
Glucose, Bld: 123 mg/dL — ABNORMAL HIGH (ref 70–99)
Potassium: 3.4 mmol/L — ABNORMAL LOW (ref 3.5–5.1)
Sodium: 140 mmol/L (ref 135–145)

## 2021-12-20 MED ORDER — POTASSIUM CHLORIDE CRYS ER 20 MEQ PO TBCR
40.0000 meq | EXTENDED_RELEASE_TABLET | Freq: Two times a day (BID) | ORAL | Status: AC
  Administered 2021-12-20 – 2021-12-21 (×4): 40 meq via ORAL
  Filled 2021-12-20 (×4): qty 2

## 2021-12-20 NOTE — Plan of Care (Signed)

## 2021-12-20 NOTE — Evaluation (Signed)
Occupational Therapy Evaluation and Discharge ?Patient Details ?Name: Ashley [name] ?MRN: [id number] ?DOB: May 15, 1966 ?Today's Date: 12/20/2021 ? ? ?History of Present Illness This 56 yo female presened to the ED with worsening BLE edema and progressive dyspnea. Pt found to have acute on chronic combined systolic and diastolic CHF and abnormal CT of the chest 2cm area of either PNA vs inflammation vs possibly neoplasm seen on CT.  PHMx:A.Fib, CAD s/p CABG, HFrEF (30-35% as of 2 weeks ago), HTN.  ? ?Clinical Impression ?  ?This 56 yo female admitted with above presents to acute OT at an independent level in her hospital room for basic ADLs. No balance issues noted with her getting around in room. Sats stayed between 93-95% on RA. No further OT needs, we will sign off.  ?   ? ?Recommendations for follow up therapy are one component of a multi-disciplinary discharge planning process, led by the attending physician.  Recommendations may be updated based on patient status, additional functional criteria and insurance authorization.  ? ?Follow Up Recommendations ? No OT follow up  ?  ?Assistance Recommended at Discharge None  ?   ?Functional Status Assessment ? Patient has not had a recent decline in their functional status  ?Equipment Recommendations ? None recommended by OT  ?  ?   ?Precautions / Restrictions Precautions ?Precautions: None ?Restrictions ?Weight Bearing Restrictions: No  ? ?  ? ?Mobility Bed Mobility ?Overal bed mobility: Independent ?  ?  ?  ?  ?  ?  ?  ?  ? ?Transfers ?  ?Equipment used: Standard walker ?  ?  ?  ?  ?  ?  ?  ?  ?  ? ?  ?Balance Overall balance assessment: Independent ?  ?  ?  ?  ?  ?  ?  ?  ?  ?  ?  ?  ?  ?  ?  ?  ?  ?  ?   ? ?ADL either performed or assessed with clinical judgement  ? ?ADL Overall ADL's : Independent ?  ?  ?  ?  ?  ?  ?  ?  ?  ?  ?  ?  ?  ?  ?  ?  ?  ?  ?  ?   ? ? ? ?Vision Patient Visual Report: No change from baseline ?   ?   ?   ?   ? ?Pertinent Vitals/Pain  Pain Assessment ?Pain Assessment: No/denies pain  ? ? ? ?Hand Dominance Right ?  ?Extremity/Trunk Assessment Upper Extremity Assessment ?Upper Extremity Assessment: Overall WFL for tasks assessed ?  ?  ?  ?  ?  ?Communication Communication ?Communication: No difficulties ?  ?Cognition Arousal/Alertness: Awake/alert ?Behavior During Therapy: [facility] for tasks assessed/performed ?Overall Cognitive Status: Within Functional Limits for tasks assessed ?  ?  ?  ?  ?  ?  ?  ?  ?  ?  ?  ?  ?  ?  ?  ?  ?  ?  ?  ?General Comments  On RA pt ranged from 93-95% O2 while moving about room, pt left on 1 liter when I left (she was on this when I arrived) ? ?  ?   ?   ? ? ?Home Living Family/patient expects to be discharged to:: Private residence ?Living Arrangements: Children ?Available Help at Discharge: Family;Available PRN/intermittently ?Type of Home: House ?Home Access: Stairs to enter ?Entrance Stairs-Number of Steps: 3 ?Entrance Stairs-Rails: None ?Home  Layout: One level ?  ?  ?Bathroom Shower/Tub: Walk-in shower ?  ?Bathroom Toilet: Standard ?  ?  ?Home Equipment: None ?  ?Additional Comments: Watches a 56 yo 1-2 days a week. Pt is usually quite active. ?  ? ?  ?Prior Functioning/Environment Prior Level of Function : Independent/Modified Independent;Driving ?  ?  ?  ?  ?  ?  ?  ?  ?  ? ?  ?  ?OT Problem List: Cardiopulmonary status limiting activity (educated on purse lipped breathing) ?  ?   ?   ?OT Goals(Current goals can be found in the care plan section) Acute Rehab OT Goals ?Patient Stated Goal: to go home and get back to everyday normal activity  ?   ? ?   ?AM-PAC OT "6 Clicks" Daily Activity     ?Outcome Measure Help from another person eating meals?: None ?Help from another person taking care of personal grooming?: None ?Help from another person toileting, which includes using toliet, bedpan, or urinal?: None ?Help from another person bathing (including washing, rinsing, drying)?: None ?Help from another person to put on  and taking off regular upper body clothing?: None ?Help from another person to put on and taking off regular lower body clothing?: None ?6 Click Score: 24 ?  ?End of Session Nurse Communication:  (NT--pt can ge up to [facility] on her own) ? ?Activity Tolerance: Patient tolerated treatment well ?Patient left: in chair;with call bell/phone within reach ? ?OT Visit Diagnosis: Muscle weakness (generalized) (M62.81)  ?              ?Time: [phone] ?OT Time Calculation (min): 28 min ?Charges:  OT General Charges ?$OT Visit: 1 Visit ?OT Evaluation ?$OT Eval Moderate Complexity: 1 Mod ?OT Treatments ?$Self Care/Home Management : 8-22 mins ? ?[place], OTR/L ?Acute Rehab Services ?Pager [phone] ?Office [phone] ? ? ? ?Almon Register ?12/20/2021, 9:37 AM ?

## 2021-12-20 NOTE — Progress Notes (Signed)
?PROGRESS NOTE ? ? ? ?Ashley [name]  NLZ:[id number] DOB: Apr 29, 1966 DOA: 12/17/2021 ?PCP: [name], MD  ? ? ?Brief Narrative:  ?56 year old with open mitral valve repair, CAD status post CABG, ischemic cardiomyopathy with ejection fraction 30 to 35%, hypertension, persistent A-fib, recent admission for fluid overload comes back to the hospital with progressive bilateral leg edema and shortness of breath.  In the emergency room hemodynamically stable.  She had A-fib with RVR, paced rhythm.  Evidence of fluid overload.  Admitted with cardiology consultation. ? ? ?Assessment & Plan: ?  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   ?Remains on IV Lasix 60 mg twice daily.  Urine output 2500 mL last 24 hours.  Net -2.6 L since admission.  Weight loss 9 pounds.  Appropriately responding. ?Started on Entresto and metoprolol .   ?Followed by cardiology. ?Replace potassium further today. ? ?Atrial fibrillation with RVR, persistent A-fib.  Atrial tachycardia. ?Sick sinus syndrome status post pacemaker. ?Remains rate controlled a flutter.  Rate control is better today.  She is on oral metoprolol and IV amiodarone.  Can likely be changed to oral amiodarone today.  Therapeutic on Eliquis.  ? ?Abnormal CT scan of the chest: CT angiogram showed 2 cm area of apical abnormality on the lower end of right upper lobe.  No recent comparison scans. ?Empiric treatment with antibiotics.  Referral sent to pulmonary tumor clinic for follow-up. ? ? ?DVT prophylaxis:  ?apixaban (ELIQUIS) tablet 5 mg  ? ?Code Status: Full code ?Family Communication: Daughter and son at the bedside. ?Disposition Plan: Status is: Inpatient ?Remains inpatient appropriate because: Abnormal heart rhythm.  On vasoactive infusions. ?  ? ? ?Consultants:  ?Cardiology ? ?Procedures:  ?None ? ?Antimicrobials:  ?Rocephin and doxycycline  4/28--- ? ? ?Subjective: ? ?Seen and examined.  She worked with occupational therapy and now sitting in chair.  Has some swelling of the legs but breathing has improved.  Denies any chest pain or palpitations. ?Telemetry with rate controlled a flutter, paced rhythm.  Most of her rates are very well controlled now.  He still remains on amiodarone infusion. ? ?Objective: ?Vitals:  ? 12/19/21 0500 12/19/21 1128 12/19/21 2000 12/20/21 0314  ?BP: 122/69 (!) 138/59 110/62 (!) 145/64  ?Pulse: 98 79 94 62  ?Resp: '20 17 19 '$ (!) 21  ?Temp:  98 ?F (36.7 ?C) 99.2 ?F (37.3 ?C) 98.6 ?F (37 ?C)  ?TempSrc:  Oral Oral Oral  ?SpO2: (!) 89% 94% 91% 90%  ?Weight:    87 kg  ?Height:      ? ? ?Intake/Output Summary (Last 24 hours) at 12/20/2021 1005 ?Last data filed at 12/20/2021 0909 ?Gross per 24 hour  ?Intake 752.24 ml  ?Output 2600 ml  ?Net -1847.76 ml  ? ?Filed Weights  ? 12/18/21 0437 12/19/21 0002 12/20/21 0314  ?Weight: 87.5 kg 87.7 kg 87 kg  ? ? ?Examination: ? ?General exam: Appears calm and comfortable.  On 1 L oxygen. ?Respiratory system: No added sounds.   ?Cardiovascular system: S1 & S2 heard, irregularly irregular.   ?Trace bilateral pedal edema.  Pacemaker in place. ?Gastrointestinal system: Abdomen is nondistended, soft and nontender. No organomegaly or masses felt. Normal bowel sounds heard. ?Central nervous system: Alert and oriented. No focal neurological deficits. ?Extremities: Symmetric 5 x 5 power. ?Skin: No rashes, lesions or ulcers ?Psychiatry: Judgement and insight appear normal. Mood & affect appropriate.  ? ? ? ?Data Reviewed: I have  personally reviewed following labs and imaging studies ? ?CBC: ?Recent Labs  ?Lab 12/17/21 ?1313  ?WBC 3.8*  ?NEUTROABS 2.3  ?HGB 11.1*  ?HCT 37.5  ?MCV 93.3  ?PLT 227  ? ?Basic Metabolic Panel: ?Recent Labs  ?Lab 12/16/21 ?1644 12/17/21 ?1313 12/18/21 ?0335 12/19/21 ?0250 12/20/21 ?0206  ?NA 143 140 140 139 140  ?K 4.5 4.5 3.4* 3.6 3.4*  ?CL 104 107 105 104 105  ?CO2 '21 25 26 25 26   '$ ?GLUCOSE 99 107* 91 114* 123*  ?BUN '11 12 9 11 13  '$ ?CREATININE 0.93 1.00 0.99 0.97 1.04*  ?CALCIUM 9.5 9.4 9.0 8.8* 8.9  ? ?GFR: ?Estimated Creatinine Clearance: 68.9 mL/min (A) (by C-G formula based on SCr of 1.04 mg/dL (H)). ?Liver Function Tests: ?No results for input(s): AST, ALT, ALKPHOS, BILITOT, PROT, ALBUMIN in the last 168 hours. ?No results for input(s): LIPASE, AMYLASE in the last 168 hours. ?No results for input(s): AMMONIA in the last 168 hours. ?Coagulation Profile: ?No results for input(s): INR, PROTIME in the last 168 hours. ?Cardiac Enzymes: ?No results for input(s): CKTOTAL, CKMB, CKMBINDEX, TROPONINI in the last 168 hours. ?BNP (last 3 results) ?No results for input(s): PROBNP in the last 8760 hours. ?HbA1C: ?No results for input(s): HGBA1C in the last 72 hours. ?CBG: ?Recent Labs  ?Lab 12/17/21 ?2123  ?GLUCAP 93  ? ?Lipid Profile: ?No results for input(s): CHOL, HDL, LDLCALC, TRIG, CHOLHDL, LDLDIRECT in the last 72 hours. ?Thyroid Function Tests: ?No results for input(s): TSH, T4TOTAL, FREET4, T3FREE, THYROIDAB in the last 72 hours. ?Anemia Panel: ?No results for input(s): VITAMINB12, FOLATE, FERRITIN, TIBC, IRON, RETICCTPCT in the last 72 hours. ?Sepsis Labs: ?No results for input(s): PROCALCITON, LATICACIDVEN in the last 168 hours. ? ?No results found for this or any previous visit (from the past 240 hour(s)).  ? ? ? ? ? ?Radiology Studies: ?No results found. ? ? ? ? ? ?Scheduled Meds: ? apixaban  5 mg Oral BID  ? atorvastatin  80 mg Oral Daily  ? dapagliflozin propanediol  10 mg Oral Daily  ? doxycycline  100 mg Oral Q12H  ? furosemide  60 mg Intravenous BID  ? metoprolol tartrate  25 mg Oral Q8H  ? potassium chloride  40 mEq Oral BID  ? sacubitril-valsartan  1 tablet Oral BID  ? sodium chloride flush  3 mL Intravenous Q12H  ? ?Continuous Infusions: ? sodium chloride    ? amiodarone 30 mg/hr (12/19/21 2103)  ? cefTRIAXone (ROCEPHIN)  IV 2 g (12/19/21 2047)  ? ? ? LOS: 2 days  ? ? ?Time spent:  35 minutes ? ? ? ?Barb Merino, MD ?[facility] ?Pager [phone] ? ?

## 2021-12-20 NOTE — Progress Notes (Signed)
? ?Progress Note ? ?Patient Name: Ashley [name] ?Date of Encounter: 12/20/2021 ? ?[facility] Cardiologist: Dorris Carnes, MD  ? ?Subjective  ? ?Feeling better.  Breathing improving.   ? ?Inpatient Medications  ?  ?Scheduled Meds: ? apixaban  5 mg Oral BID  ? atorvastatin  80 mg Oral Daily  ? dapagliflozin propanediol  10 mg Oral Daily  ? doxycycline  100 mg Oral Q12H  ? furosemide  60 mg Intravenous BID  ? metoprolol tartrate  25 mg Oral Q8H  ? potassium chloride  40 mEq Oral BID  ? sacubitril-valsartan  1 tablet Oral BID  ? sodium chloride flush  3 mL Intravenous Q12H  ? ?Continuous Infusions: ? sodium chloride    ? amiodarone 30 mg/hr (12/19/21 2103)  ? cefTRIAXone (ROCEPHIN)  IV 2 g (12/19/21 2047)  ? ?PRN Meds: ?sodium chloride, acetaminophen, ondansetron (ZOFRAN) IV, sodium chloride flush  ? ?Vital Signs  ?  ?Vitals:  ? 12/19/21 0500 12/19/21 1128 12/19/21 2000 12/20/21 0314  ?BP: 122/69 (!) 138/59 110/62 (!) 145/64  ?Pulse: 98 79 94 62  ?Resp: '20 17 19 '$ (!) 21  ?Temp:  98 ?F (36.7 ?C) 99.2 ?F (37.3 ?C) 98.6 ?F (37 ?C)  ?TempSrc:  Oral Oral Oral  ?SpO2: (!) 89% 94% 91% 90%  ?Weight:    87 kg  ?Height:      ? ? ?Intake/Output Summary (Last 24 hours) at 12/20/2021 0935 ?Last data filed at 12/20/2021 0909 ?Gross per 24 hour  ?Intake 752.24 ml  ?Output 2600 ml  ?Net -1847.76 ml  ? ? ?  12/20/2021  ?  3:14 AM 12/19/2021  ? 12:02 AM 12/18/2021  ?  4:37 AM  ?Last 3 Weights  ?Weight (lbs) 191 lb 12.8 oz 193 lb 4.8 oz 192 lb 12.8 oz  ?Weight (kg) 87 kg 87.68 kg 87.454 kg  ?   ? ?Telemetry  ?  ? Atrial fibrillation.  Now APVS and ASVP.  4 NSVT- Personally Reviewed ? ?ECG  ?  ?Atrial tachycardia.  Frequent ventricular pacing - Personally Reviewed ? ?Physical Exam  ? ?VS:  BP (!) 145/64 (BP Location: Left Arm)   Pulse 62   Temp 98.6 ?F (37 ?C) (Oral)   Resp (!) 21   Ht '5\' 8"'$  (1.727 m)   Wt 87 kg   LMP 05/18/2015 Comment: went for 3 week after skipping two months   SpO2 90%   BMI 29.16 kg/m?  , BMI Body mass index is  29.16 kg/m?. ?GENERAL:  Well appearing ?HEENT: Pupils equal round and reactive, fundi not visualized, oral mucosa unremarkable ?NECK:  +jugular venous distention, waveform within normal limits, carotid upstroke brisk and symmetric, no bruits, no thyromegaly ?LUNGS:  Mild basilar crackles ?HEART:  Mostly regular with ectopy.  PMI not displaced or sustained,S1 and S2 within normal limits, no S3, no S4, no clicks, no rubs, no murmurs ?ABD:  Flat, positive bowel sounds normal in frequency in pitch, no bruits, no rebound, no guarding, no midline pulsatile mass, no hepatomegaly, no splenomegaly ?EXT:  2 plus pulses throughout, R>L LE edema, no cyanosis no clubbing ?SKIN:  No rashes no nodules ?NEURO:  Cranial nerves II through XII grossly intact, motor grossly intact throughout ?PSYCH:  Cognitively intact, oriented to person place and time ? ? ?Labs  ?  ?High Sensitivity Troponin:   ?Recent Labs  ?Lab 12/04/21 ?2141 12/05/21 ?0412  ?TROPONINIHS 21* 24*  ?   ?Chemistry ?Recent Labs  ?Lab 12/18/21 ?0335 12/19/21 ?0250 12/20/21 ?0206  ?  NA 140 139 140  ?K 3.4* 3.6 3.4*  ?CL 105 104 105  ?CO2 '26 25 26  '$ ?GLUCOSE 91 114* 123*  ?BUN '9 11 13  '$ ?CREATININE 0.99 0.97 1.04*  ?CALCIUM 9.0 8.8* 8.9  ?GFRNONAA >60 >60 >60  ?ANIONGAP '9 10 9  '$ ?  ?Lipids No results for input(s): CHOL, TRIG, HDL, LABVLDL, LDLCALC, CHOLHDL in the last 168 hours.  ?Hematology ?Recent Labs  ?Lab 12/17/21 ?1313  ?WBC 3.8*  ?RBC 4.02  ?HGB 11.1*  ?HCT 37.5  ?MCV 93.3  ?MCH 27.6  ?MCHC 29.6*  ?RDW 17.9*  ?PLT 227  ? ?Thyroid No results for input(s): TSH, FREET4 in the last 168 hours.  ?BNP ?Recent Labs  ?Lab 12/17/21 ?1313  ?BNP 1,304.4*  ?  ?DDimer  ?Recent Labs  ?Lab 12/17/21 ?1313  ?DDIMER 17.29*  ?  ? ?Radiology  ?  ?No results found. ? ?Cardiac Studies  ? ?Echo 12/06/21 ? 1. Left ventricular ejection fraction, by estimation, is 30 to 35%. The  ?left ventricle has moderately decreased function. The left ventricle  ?demonstrates global hypokinesis. Left  ventricular diastolic parameters are  ?indeterminate.  ? 2. Mildly D-shaped interventricular septum suggests a degree of RV  ?pressure/volume overload. Right ventricular systolic function is  ?moderately reduced. The right ventricular size is moderately enlarged.  ?There is moderately elevated pulmonary artery  ?systolic pressure. The estimated right ventricular systolic pressure is  ?83.4 mmHg.  ? 3. Left atrial size was severely dilated.  ? 4. Right atrial size was moderately dilated.  ? 5. S/p mitral valve repair. The posterior leaflet appears severely  ?restricted. Mean gradient 9 mmHg with MVA 1.08 cm^2 by VTI suggests  ?moderate-severe mitral stenosis. At least moderate mitral regurgitation  ?(may not be fully visualized, thick CW doppler  ? jet).  ? 6. The tricuspid valve is abnormal. Tricuspid valve regurgitation is  ?severe.  ? 7. The aortic valve is tricuspid. There is mild calcification of the  ?aortic valve. Aortic valve regurgitation is trivial. Aortic valve  ?sclerosis/calcification is present, without any evidence of aortic  ?stenosis.  ? 8. The inferior vena cava is dilated in size with <50% respiratory  ?variability, suggesting right atrial pressure of 15 mmHg. ?  ? ?Patient Profile  ?   ?56 y.o. female with paroxysmal atrial fibrillation, atrial tachycardia, s/p ST Jude PPM, CAD s/p CABG, chronic systolic and diastolic heart failure admitted with acute on chronic systolic and diastolic heart failure  ? ?Assessment & Plan  ?  ?# Acute on chronic systolic and diastolic heart failure:  ?Yesterday she was net -1.5L.  She remains volume overloaded.  Continue with IV Lasix today and likely transition to oral diuretics tomorrow.  Continue metoprolol, [place], and [place].  Plan to switch metoprolol to succinate once her dose is determined. ? ?# Paroxysmal atrial fibrillation:  ?Converted to sinus rhythm on IV amiodarone. EP is seeing her.  Yesterday she had some atrial tachycardia.  Today she is back in  sinus rhythm and has atrial and ventricular pacing.  Continue amiodarone and Eliquis.  Her metoprolol dose was increased by EP.  Appreciate their recommendations.  ? ?# Sinus node dsyfunction:  ?S/s St. Jude PPM. ? ?# CAD:  ?# Hyperlipidemia:  ?S/p CABG in 2013.  Not an active issue.  Continue atorvastatin and metoprolol. ?   ? ?For questions or updates, please contact [place] ?Please consult [email or web address] for contact info under  ? ?  ?   ?Signed, ?Skeet Latch, MD  ?  12/20/2021, 9:35 AM   ? ?

## 2021-12-20 NOTE — Evaluation (Signed)
Physical Therapy Evaluation ?Patient Details ?Name: Ashley [name] ?MRN: [id number] ?DOB: Mar 18, 1966 ?Today's Date: 12/20/2021 ? ?History of Present Illness ? This 56 yo female presened to the ED with worsening BLE edema and progressive dyspnea. Pt found to have acute on chronic combined systolic and diastolic CHF and abnormal CT of the chest 2cm area of either PNA vs inflammation vs possibly neoplasm seen on CT.  PHMx:A.Fib, CAD s/p CABG, HFrEF (30-35% as of 2 weeks ago), HTN.  ?Clinical Impression ? Pt presents to PT with slightly unsteady gait due to illness and inactivity. Expect pt will make good progress back to baseline with mobility. Will follow acutely but doubt pt will need PT after DC.  ?   ?   ? ?Recommendations for follow up therapy are one component of a multi-disciplinary discharge planning process, led by the attending physician.  Recommendations may be updated based on patient status, additional functional criteria and insurance authorization. ? ?Follow Up Recommendations No PT follow up ? ?  ?Assistance Recommended at Discharge None  ?Patient can return home with the following ?   ? ?  ?Equipment Recommendations None recommended by PT  ?Recommendations for Other Services ?    ?  ?Functional Status Assessment Patient has had a recent decline in their functional status and demonstrates the ability to make significant improvements in function in a reasonable and predictable amount of time.  ? ?  ?Precautions / Restrictions Precautions ?Precautions: None ?Restrictions ?Weight Bearing Restrictions: No  ? ?  ? ?Mobility ? Bed Mobility ?Overal bed mobility: Independent ?  ?  ?  ?  ?  ?  ?  ?  ? ?Transfers ?Overall transfer level: Modified independent ?Equipment used: None ?  ?  ?  ?  ?  ?  ?  ?  ?  ? ?Ambulation/Gait ?Ambulation/Gait assistance: Supervision ?Gait Distance (Feet): 350 Feet ?Assistive device: None ?Gait Pattern/deviations: Step-through pattern, Wide base of support ?Gait velocity:  adequate ?Gait velocity interpretation: >2.62 ft/sec, indicative of community ambulatory ?  ?General Gait Details: Supervision with incr distance with heavy footfalls. Pt amb in non skid socks and typically wears shoes. ? ?Stairs ?  ?  ?  ?  ?  ? ?Wheelchair Mobility ?  ? ?Modified Rankin (Stroke Patients Only) ?  ? ?  ? ?Balance Overall balance assessment: Mild deficits observed, not formally tested ?  ?  ?  ?  ?  ?  ?  ?  ?  ?  ?  ?  ?  ?  ?  ?  ?  ?  ?   ? ? ? ?Pertinent Vitals/Pain Pain Assessment ?Pain Assessment: No/denies pain  ? ? ?Home Living Family/patient expects to be discharged to:: Private residence ?Living Arrangements: Children ?Available Help at Discharge: Family;Available PRN/intermittently ?Type of Home: House ?Home Access: Stairs to enter ?Entrance Stairs-Rails: None ?Entrance Stairs-Number of Steps: 3 ?  ?Home Layout: One level ?Home Equipment: None ?Additional Comments: Watches a 56 yo 1-2 days a week. Pt is usually quite active.  ?  ?Prior Function Prior Level of Function : Independent/Modified Independent;Driving ?  ?  ?  ?  ?  ?  ?  ?  ?  ? ? ?Hand Dominance  ? Dominant Hand: Right ? ?  ?Extremity/Trunk Assessment  ? Upper Extremity Assessment ?Upper Extremity Assessment: Defer to OT evaluation ?  ? ?Lower Extremity Assessment ?Lower Extremity Assessment: Overall WFL for tasks assessed ?  ? ?   ?Communication  ? Communication:  No difficulties  ?Cognition Arousal/Alertness: Awake/alert ?Behavior During Therapy: [facility] for tasks assessed/performed ?Overall Cognitive Status: Within Functional Limits for tasks assessed ?  ?  ?  ?  ?  ?  ?  ?  ?  ?  ?  ?  ?  ?  ?  ?  ?  ?  ?  ? ?  ?General Comments General comments (skin integrity, edema, etc.): SpO2 >93% on RA with activity ? ?  ?Exercises    ? ?Assessment/Plan  ?  ?PT Assessment Patient needs continued PT services  ?PT Problem List Decreased balance;Decreased mobility ? ?   ?  ?PT Treatment Interventions Gait training;Balance training;Therapeutic  activities;Functional mobility training;Patient/family education   ? ?PT Goals (Current goals can be found in the Care Plan section)  ?Acute Rehab PT Goals ?Patient Stated Goal: return home ?PT Goal Formulation: With patient ?Time For Goal Achievement: 12/27/21 ?Potential to Achieve Goals: Good ? ?  ?Frequency Min 2X/week ?  ? ? ?Co-evaluation   ?  ?  ?  ?  ? ? ?  ?AM-PAC PT "6 Clicks" Mobility  ?Outcome Measure Help needed turning from your back to your side while in a flat bed without using bedrails?: None ?Help needed moving from lying on your back to sitting on the side of a flat bed without using bedrails?: None ?Help needed moving to and from a bed to a chair (including a wheelchair)?: None ?Help needed standing up from a chair using your arms (e.g., wheelchair or bedside chair)?: None ?Help needed to walk in hospital room?: A Little ?Help needed climbing 3-5 steps with a railing? : A Little ?6 Click Score: 22 ? ?  ?End of Session   ?Activity Tolerance: Patient tolerated treatment well ?Patient left: in bed;with call bell/phone within reach ?  ?PT Visit Diagnosis: Other abnormalities of gait and mobility (R26.89) ?  ? ?Time: 4825-0037 ?PT Time Calculation (min) (ACUTE ONLY): 13 min ? ? ?Charges:   PT Evaluation ?$PT Eval Low Complexity: 1 Low ?  ?  ?   ? ? ?[facility] PT ?Acute Rehabilitation Services ?Office [phone] ? ? ?Ashley [name] [facility] ?12/20/2021, 7:02 PM ? ?

## 2021-12-21 ENCOUNTER — Ambulatory Visit (INDEPENDENT_AMBULATORY_CARE_PROVIDER_SITE_OTHER): Payer: Self-pay

## 2021-12-21 ENCOUNTER — Encounter (HOSPITAL_COMMUNITY): Payer: Self-pay | Admitting: Internal Medicine

## 2021-12-21 ENCOUNTER — Other Ambulatory Visit (HOSPITAL_COMMUNITY): Payer: Self-pay

## 2021-12-21 DIAGNOSIS — I2510 Atherosclerotic heart disease of native coronary artery without angina pectoris: Secondary | ICD-10-CM | POA: Diagnosis not present

## 2021-12-21 DIAGNOSIS — I495 Sick sinus syndrome: Secondary | ICD-10-CM

## 2021-12-21 DIAGNOSIS — Z9889 Other specified postprocedural states: Secondary | ICD-10-CM | POA: Diagnosis not present

## 2021-12-21 DIAGNOSIS — I10 Essential (primary) hypertension: Secondary | ICD-10-CM

## 2021-12-21 DIAGNOSIS — I4891 Unspecified atrial fibrillation: Secondary | ICD-10-CM | POA: Diagnosis not present

## 2021-12-21 DIAGNOSIS — I5043 Acute on chronic combined systolic (congestive) and diastolic (congestive) heart failure: Secondary | ICD-10-CM | POA: Diagnosis not present

## 2021-12-21 LAB — BASIC METABOLIC PANEL
Anion gap: 10 (ref 5–15)
BUN: 13 mg/dL (ref 6–20)
CO2: 22 mmol/L (ref 22–32)
Calcium: 9 mg/dL (ref 8.9–10.3)
Chloride: 105 mmol/L (ref 98–111)
Creatinine, Ser: 0.97 mg/dL (ref 0.44–1.00)
GFR, Estimated: >60 mL/min (ref >60)
Glucose, Bld: 112 mg/dL — ABNORMAL HIGH (ref 70–99)
Potassium: 3.7 mmol/L (ref 3.5–5.1)
Sodium: 137 mmol/L (ref 135–145)

## 2021-12-21 MED ORDER — AMIODARONE HCL 200 MG PO TABS: 200.0000 mg | ORAL_TABLET | Freq: Every day | ORAL | Status: DC

## 2021-12-21 MED ORDER — METOPROLOL SUCCINATE ER 100 MG PO TB24
100.0000 mg | ORAL_TABLET | Freq: Every day | ORAL | Status: DC
Start: 2021-12-22 — End: 2021-12-22
  Administered 2021-12-22: 100 mg via ORAL
  Filled 2021-12-21: qty 1

## 2021-12-21 MED ORDER — AMIODARONE HCL 200 MG PO TABS
200.0000 mg | ORAL_TABLET | Freq: Two times a day (BID) | ORAL | Status: DC
Start: 2021-12-21 — End: 2021-12-22
  Administered 2021-12-21 – 2021-12-22 (×3): 200 mg via ORAL
  Filled 2021-12-21 (×3): qty 1

## 2021-12-21 MED ORDER — FUROSEMIDE 40 MG PO TABS
40.0000 mg | ORAL_TABLET | Freq: Two times a day (BID) | ORAL | Status: DC
  Administered 2021-12-22: 40 mg via ORAL
  Filled 2021-12-21: qty 1

## 2021-12-21 NOTE — Progress Notes (Signed)
Heart Failure Navigator Progress Note ? ?Following this hospitalization to assess for HV TOC readiness.  ? ?Following for F/U with TOC. ? ?Earnestine Leys, BSN, RN ?Heart Failure Nurse Navigator ?Secure Chat Only  ?

## 2021-12-21 NOTE — Progress Notes (Signed)
? ?PCP:  [name], MD ?Primary Cardiologist: Dorris Carnes, MD ?Electrophysiologist: Virl Axe, MD  ? ?Ashley [name] is a 56 y.o. female seen today for Virl Axe, MD for post hospital follow up.  ? ?Admitted 4/28 - 5/3 for acute/chronic CHF and atrial fibrillation. Converted to NSR on IV amiodarone and meds adjusted and diuresed. Discharged on amio, eliquis, and toprol for her AF/AFL. ?  ? Since discharge from the hospital, patient reports great. Yesterday was the first day in a "long time" that she woke up without edema in her feet. She has not felt any further fib/flutter.  she denies chest pain, palpitations, dyspnea, PND, orthopnea, nausea, vomiting, dizziness, syncope, weight gain, or early satiety. She has mild edema in the evenings. ? ?Past Medical History:  ?Diagnosis Date  ? Abdominal pain   ? ? chronic cholecystitis; admxn to [facility] 9/12 (CT, USN, HIDA done)  ? Atrial fibrillation ([place])   ? Cardiomyopathy   ? echo 9/12: mild LVH, EF 35-40%, mod MR (difficult to judge /post leaflet restricted), mod BAE, mod RVE, mod TR   ? Cardiomyopathy ([place])   ? a. echo 9/12: mild LVH, EF 35-40%, mod MR (difficult to judge /post leaflet restricted), mod BAE, mod RVE, mod TR. b. Echo 11/2011: mild LVH, EF 55-60%, s/p MV repair w/o sig MR/MS, mildly dilated RV/RA, PASP 18mHg. // AFL dx 10/2018 - Echo 10/2018: EF 30, diff HK, s/p MV repair with trivial MR, mean MV 7 but not c/w significant mitral stenosis, mild RAE, severe LAE, mod reduced RVSF   ? Carotid artery disease ([place]   ? a. Duplex 06/2015: 50% RECA, 1-39% BICA.  ? CHF (congestive heart failure) ([place]   ? Clotting disorder ([place]   ? Coronary artery disease   ? a. s/p CABG 10/2011 - LIMA-LAD, SVG-PDA, SVG-OM, SVG-diagonal.  ? Dyslipidemia   ? Essential hypertension   ? Heart murmur   ? Hemifacial spasm   ? History of Doppler ultrasound   ? [facility]. [facility] [place] [place]: no hemodynamically significant ICA stenosis  ?  Hyperlipidemia   ? Mitral regurgitation   ? a. s/p MV repair 10/2011 - on Coumadin for 3 months afterwards then d/c'd by surgery.  ? Recurrent upper respiratory infection (URI)   ? 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 S/P mitral valve repair 10/21/2011  ? Stroke ([facility] 1989  ? a. age 56- in setting of cigs and OCPs. //  b. s/p acute L parietal CV[facility] [place] [place] ? Stroke ([facility]/2017  ? Tobacco abuse   ? ?Past Surgical History:  ?Procedure Laterality Date  ? CARDIAC CATHETERIZATION    ? CARDIOVERSION N/A 01/25/2019  ? Procedure: CARDIOVERSION;  Surgeon: HPixie Casino MD;  [place]  Service: Cardiovascular;  Laterality: N/A;  ? CARDIOVERSION N/A 06/14/2019  ? Procedure: CARDIOVERSION;  Surgeon: HPixie Casino MD;  [place]  Service: Cardiovascular;  Laterality: N/A;  ? CR[place] ? twins  ? CORONARY ARTERY BYPASS GRAFT  10/21/2011  ? Procedure: CORONARY ARTERY BYPASS GRAFTING (CABG);  Surgeon: CRexene Alberts MD;  [place]  Service: Open Heart Surgery;  Laterality: N/A;  cabg x four, using right leg greater saphenous vein harvested endoscopically  ? DENTAL SURGERY    ? MITRAL VALVE REPAIR  10/21/2011  ? Procedure: MITRAL VALVE REPAIR (MVR);  Surgeon: CRexene Alberts MD;  [facility];  Service: Open Heart Surgery;  Laterality: N/A;  ? PACEMAKER IMPLANT N/A 06/25/2019  ? Procedure: PACEMAKER IMPLANT;  Surgeon: [name], MD;  Location[place] CV LAB;  Service: Cardiovascular;  Laterality: N/A;  ? TEE WITHOUT CARDIOVERSION  06/30/2011  ? Procedure: TRANSESOPHAGEAL ECHOCARDIOGRAM (TEE);  Surgeon: [name], MD;  Location: [facility] ENDOSCOPY;  Service: Cardiovascular;  Laterality: N/A;  ? TONSILLECTOMY    ? TUBAL LIGATION    ? ? ?Current Outpatient Medications  ?Medication Sig Dispense Refill  ? acetaminophen (TYLENOL) 500 MG tablet Take 500-1,000 mg by mouth every 6 (six) hours as needed for headache.    ? amiodarone (PACERONE)  200 MG tablet Take 1 tablet (200 mg total) by mouth 2 (two) times daily. 60 tablet 6  ? apixaban (ELIQUIS) 5 MG TABS tablet Take 1 tablet (5 mg total) by mouth 2 (two) times daily. 180 tablet 3  ? atorvastatin (LIPITOR) 80 MG tablet Take 1 tablet (80 mg total) by mouth daily. 90 tablet 3  ? dapagliflozin propanediol (FARXIGA) 10 MG TABS tablet Take 1 tablet (10 mg total) by mouth daily. 30 tablet 0  ? diphenhydrAMINE (BENADRYL) 25 MG tablet Take 25 mg by mouth every 6 (six) hours as needed for allergies.    ? famotidine (PEPCID) 20 MG tablet Take 20 mg by mouth daily as needed for indigestion.     ? ferrous sulfate 325 (65 FE) MG tablet Take 1 tablet (325 mg total) by mouth daily with breakfast. 30 tablet 3  ? furosemide (LASIX) 40 MG tablet Take 1 tablet (40 mg total) by mouth 2 (two) times daily. 60 tablet 0  ? metoprolol succinate (TOPROL-XL) 100 MG 24 hr tablet Take 1 tablet (100 mg total) by mouth daily. Take with or immediately following a meal. 30 tablet 0  ? sacubitril-valsartan (ENTRESTO) 24-26 MG Take 1 tablet by mouth 2 (two) times daily. 60 tablet 0  ? Saline 0.2 % SOLN Place 1 spray into both nostrils daily as needed (for congestion).    ? vitamin B-12 (CYANOCOBALAMIN) 1000 MCG tablet Take 1 tablet (1,000 mcg total) by mouth daily.    ? vitamin C (ASCORBIC ACID) 500 MG tablet Take 1,000 mg by mouth 2 (two) times daily.     ? ?Current Facility-Administered Medications  ?Medication Dose Route Frequency Provider Last Rate Last Admin  ? incobotulinumtoxinA (XEOMIN) 50 units injection 50 Units  50 Units Intramuscular Q90 days Marcial Pacas, MD   50 Units at 11/30/16 1546  ? incobotulinumtoxinA (XEOMIN) 50 units injection 50 Units  50 Units Intramuscular Q90 days Marcial Pacas, MD   50 Units at 07/25/17 [phone]  ? incobotulinumtoxinA (XEOMIN) 50 units injection 50 Units  50 Units Intramuscular Q90 days Marcial Pacas, MD   50 Units at 07/26/17 1217  ? ? ?Allergies  ?Allergen Reactions  ? Azithromycin Nausea Only  ? Latex  Rash  ? Spinach Hives  ? Tape Other (See Comments)  ?  PLEASE USE PAPER TAPE!! AFFECTS SKIN BADLY!!!!  ? ? ?Social History  ? ?Socioeconomic History  ? Marital status: Widowed  ?  Spouse name: Not on file  ? Number of children: 2  ? Years of education: Not on file  ? Highest education level: Bachelor's degree (e.g., BA, AB, BS)  ?Occupational History  ? Occupation: Payroll/Paperwork  ?  Comment: 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  ? Occupation: Disability  ?  Comment: since march 2023  ?Tobacco Use  ? Smoking status: Former  ?  Packs/day: 1.00  ?  Years: 30.00  ?  Pack years: 30.00  ?  Types: Cigarettes  ?  Quit date: 05/07/2011  ?  Years since quitting: 10.6  ? Smokeless tobacco: Never  ?Vaping Use  ? Vaping Use: Never used  ?Substance and Sexual Activity  ? Alcohol use: Yes  ?  Alcohol/week: 1.0 standard drink  ?  Types: 1 Cans of beer per week  ?  Comment: can of beer 1 x week  ? Drug use: No  ? Sexual activity: Yes  ?Other Topics Concern  ? Not on file  ?Social History Narrative  ? Lives at home with husband and son.  ? Right-handed.  ? 2 cups caffeine daily.  ? ?Social Determinants of Health  ? ?Financial Resource Strain: Not on file  ?Food Insecurity: No Food Insecurity  ? Worried About Charity fundraiser in the Last Year: Never true  ? Ran Out of Food in the Last Year: Never true  ?Transportation Needs: No Transportation Needs  ? Lack of Transportation (Medical): No  ? Lack of Transportation (Non-Medical): No  ?Physical Activity: Not on file  ?Stress: Not on file  ?Social Connections: Not on file  ?Intimate Partner Violence: Not on file  ? ? ? ?Review of Systems: ?All other systems reviewed and are otherwise negative except as noted above. ? ?Physical Exam: ?Vitals:  ? 12/28/21 0927  ?BP: 140/80  ?Pulse: 60  ?SpO2: 95%  ?Weight: 184 lb (83.5 kg)  ?Height: '5\' 8"'$  (1.727 m)  ? ? ?GEN- The patient is well appearing, alert and oriented x 3 today.   ?HEENT: normocephalic, atraumatic; sclera clear, conjunctiva  pink; hearing intact; oropharynx clear; neck supple, no JVP ?Lymph- no cervical lymphadenopathy ?Lungs- Clear to ausculation bilaterally, normal work of breathing.  No wheezes, rales, rhonchi ?Heart- Regular rate and

## 2021-12-21 NOTE — TOC Benefit Eligibility Note (Signed)
Patient Advocate Encounter ? ?Insurance verification completed.   ? ?The patient is currently admitted and upon discharge could be taking Entresto 24-26 mg. ? ?Requires Prior Authorization ? ?The patient is currently admitted and upon discharge could be taking Farxiga 10 mg. ? ?Requires Prior Authorization ? ?The patient is insured through [place] ? ? ? ?Lyndel Safe, CPhT ?Pharmacy Patient Advocate Specialist ?[place] Patient Advocate Team ?Direct Number: [phone]  Fax: [phone] ? ? ? ? ? ?  ?

## 2021-12-21 NOTE — TOC Benefit Eligibility Note (Signed)
Patient Advocate Encounter ?  ?Received notification that prior authorization for Farxiga '10MG'$  tablets is required. ?  ?PA submitted on 12/21/2021 ?Key [id number] ?Status is pending ?   ? ? ? ?Lyndel Safe, CPhT ?Pharmacy Patient Advocate Specialist ?[place] Patient Advocate Team ?Direct Number: [phone]  Fax: [phone]  ?

## 2021-12-21 NOTE — Plan of Care (Signed)

## 2021-12-21 NOTE — TOC Benefit Eligibility Note (Addendum)
Patient Advocate Encounter ?  ?Received notification that prior authorization for Entresto 24-'26MG'$  tablets is required. ?  ?PA submitted on 12/21/2021 ?Key [id number] ?Status is pending ?   ? ? ? ?Lyndel Safe, CPhT ?Pharmacy Patient Advocate Specialist ?[place] Patient Advocate Team ?Direct Number: [phone]  Fax: [phone]  ? ? ? ? ? ?  ?

## 2021-12-21 NOTE — Progress Notes (Signed)
Heart Failure Nurse Navigator Progress Note ? ?PCP: [name], MD ?PCP-Cardiologist: Caryl Comes ?Admission Diagnosis: Acute on chronic systolic and diastolic heart failure.  ?Admitted from: Home ? ?Presentation:   ?[place] presented with worsening shortness of breath and bilateral leg edema, Afib-RVR, paced rhythm, fluid overload, BNP 1,304, IV lasix given, amiodarone infusion started,  ? ?Education was done with patient on the sign and symptoms of heart failure, daily weights, when to call her doctor or go to the ER, Medication compliance, diet/fluid restriction. Patient voiced her understanding of all education and stated , she knew something was off very early on and came in to the hospital. Patient will be coming to [facility] on 01/03/22 @ 9am, she will have her son and daughter with her as well.  ? ?ECHO/ LVEF: 30-35% ? ?Clinical Course: ? ?Past Medical History:  ?Diagnosis Date  ? Abdominal pain   ? ? chronic cholecystitis; admxn to [facility] 9/12 (CT, USN, HIDA done)  ? Atrial fibrillation ([place])   ? Cardiomyopathy   ? echo 9/12: mild LVH, EF 35-40%, mod MR (difficult to judge /post leaflet restricted), mod BAE, mod RVE, mod TR   ? Cardiomyopathy ([place])   ? a. echo 9/12: mild LVH, EF 35-40%, mod MR (difficult to judge /post leaflet restricted), mod BAE, mod RVE, mod TR. b. Echo 11/2011: mild LVH, EF 55-60%, s/p MV repair w/o sig MR/MS, mildly dilated RV/RA, PASP 11mHg. // AFL dx 10/2018 - Echo 10/2018: EF 30, diff HK, s/p MV repair with trivial MR, mean MV 7 but not c/w significant mitral stenosis, mild RAE, severe LAE, mod reduced RVSF   ? Carotid artery disease ([place]   ? a. Duplex 06/2015: 50% RECA, 1-39% BICA.  ? CHF (congestive heart failure) ([place]   ? Clotting disorder ([place]   ? Coronary artery disease   ? a. s/p CABG 10/2011 - LIMA-LAD, SVG-PDA, SVG-OM, SVG-diagonal.  ? Dyslipidemia   ? Essential hypertension   ? Heart murmur   ? Hemifacial spasm   ? History of Doppler ultrasound   ? [facility] [place] [place] [facility]: no hemodynamically significant ICA stenosis  ? Hyperlipidemia   ? Mitral regurgitation   ? a. s/p MV repair 10/2011 - on Coumadin for 3 months afterwards then d/c'd by surgery.  ? Recurrent upper respiratory infection (URI)   ? 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 S/P mitral valve repair 10/21/2011  ? St[facility] ([facility]  ? a. age [age]- in setting of cigs and OCPs. //  b. s/p acute L parietal [facility] [place] [place] ? St[facility] ([facility] 01/2017  ? Tobacco abuse   ?  ? ?Social History  ? ?Socioeconomic History  ? Marital status: Widowed  ?  Spouse name: Not on file  ? Number of children: 2  ? Years of education: Not on file  ? Highest education level: Bachelor's degree (e.g., BA, AB, BS)  ?Occupational History  ? Occupation: Payroll/Paperwork  ?  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 Occupation: Disability  ?  Comment: since march 2023  ?Tobacco Use  ? Smoking status: Former  ?  Packs/day: 1.00  ?  Years: 30.00  ?  Pack years: 30.00  ?  Types: Cigarettes  ?  Quit date: 05/07/2011  ?  Years since quitting: 10.6  ? Smokeless tobacco: Never  ?Vaping Use  ? Vaping Use: Never used  ?Substance and  Sexual Activity  ? Alcohol use: Yes  ?  Alcohol/week: 1.0 standard drink  ?  Types: 1 Cans of beer per week  ?  Comment: can of beer 1 x week  ? Drug use: No  ? Sexual activity: Yes  ?Other Topics Concern  ? Not on file  ?Social History Narrative  ? Lives at home with husband and son.  ? Right-handed.  ? 2 cups caffeine daily.  ? ?Social Determinants of Health  ? ?Financial Resource Strain: Not on file  ?Food Insecurity: No Food Insecurity  ? Worried About Charity fundraiser in the Last Year: Never true  ? Ran Out of Food in the Last Year: Never true  ?Transportation Needs: No Transportation Needs  ? Lack of Transportation (Medical): No  ? Lack of Transportation (Non-Medical): No  ?Physical Activity: Not on file  ?Stress: Not on file  ?Social Connections: Not on file   ? ? ?High Risk Criteria for Readmission and/or Poor Patient Outcomes: ?Heart failure hospital admissions (last 6 months): 1  ?No Show rate: 1 % ?Difficult social situation: No ?Demonstrates medication adherence: Yes ?Primary Language: English ?Literacy level: Reading, writing, and comprehension. ? ?Barriers of Care:   ?Diet/fluid restrictions ? ? ?Considerations/Referrals:  ? ?Referral made to Heart Failure Pharmacist Stewardship: No ?Referral made to Heart Failure CSW/NCM TOC: No ?Referral made to Heart & Vascular TOC clinic: Yes, 01/03/2022 @ 9 am ? ?Items for Follow-up on DC/TOC: ?Optimize medications ?Diet/fluid restrictions ?Daily weights ? ? ?Earnestine Leys, BSN, RN ?Heart Failure Nurse Navigator ?Secure Chat Only   ?

## 2021-12-21 NOTE — Progress Notes (Signed)
?PROGRESS NOTE ? ? ? ?Ashley [name]  BDZ:[id number] DOB: 1966/01/28 DOA: 12/17/2021 ?PCP: [name], MD  ? ? ?Brief Narrative:  ?56 year old with open mitral valve repair, CAD status post CABG, ischemic cardiomyopathy with ejection fraction 30 to 35%, hypertension, persistent A-fib, recent admission for fluid overload comes back to the hospital with progressive bilateral leg edema and shortness of breath.  In the emergency room hemodynamically stable.  She had A-fib with RVR, paced rhythm.  Evidence of fluid overload.  Admitted with cardiology consultation. ? ? ?Assessment & Plan: ?  ?Acute on chronic combined heart failure with known ischemic cardiomyopathy: ?Patient was admitted and treated aggressively with IV diuresis, she is clinically improving and responding well.  Cardiology planning to change today to oral Lasix and monitor status. ?Patient is on Entresto and Toprol-XL.  Potassium is adequate. ? ?Atrial fibrillation with RVR, persistent A-fib.  Atrial tachycardia. ?Sick sinus syndrome status post pacemaker. ?Remains in sinus rhythm today.  She is on amiodarone infusion.  Possibly can go on oral amiodarone and long-acting metoprolol today.  She is therapeutic on Eliquis.  She does have atrial tachycardia occasional liquid however her heart rate is well controlled than before. ?She will have follow-up with EP. ? ?Abnormal CT scan of the chest: CT angiogram showed 2 cm area of apical abnormality on the lower end of right upper lobe.  No recent comparison scans. ?She had no evidence of infection.  Rocephin day 5/5 today.  Doxycycline day 5 today.  Empiric antibiotics, will prescribe doxycycline for 2 weeks on discharge.  Already sent referral to pulmonary tumor clinic for follow-up. ? ?Case discussed with cardiology.  Planning to change to oral antibiotics today, monitor for atrial tachycardia, fluid balance and oxygen requirement today.  Anticipate home tomorrow. ? ? ?DVT prophylaxis:  ?apixaban  (ELIQUIS) tablet 5 mg  ? ?Code Status: Full code ?Family Communication: Son at the bedside. ?Disposition Plan: Status is: Inpatient ?Remains inpatient appropriate because: Abnormal heart rhythm.  Active heart failure treatment. ?  ? ? ?Consultants:  ?Cardiology ? ?Procedures:  ?None ? ?Antimicrobials:  ?Rocephin and doxycycline 4/28--- ? ? ?Subjective: ? ?Patient seen and examined.  She was able to walk better.  Did not get short of breath but was put back on 2 L oxygen.  Legs are less painful and less swollen. ?Telemetry shows AV paced rhythm, sinus and rate controlled with very rare atrial tachycardias.  Patient did not have any chest pain or palpitations. ? ?Objective: ?Vitals:  ? 12/20/21 1052 12/20/21 2120 12/21/21 0055 12/21/21 0300  ?BP: (!) 124/56 (!) 129/106 (!) 134/59   ?Pulse: 60     ?Resp: 17     ?Temp: 98.5 ?F (36.9 ?C) 98.7 ?F (37.1 ?C) 98.5 ?F (36.9 ?C)   ?TempSrc: Oral Oral Oral   ?SpO2: 96%     ?Weight:    87 kg  ?Height:      ? ? ?Intake/Output Summary (Last 24 hours) at 12/21/2021 1147 ?Last data filed at 12/21/2021 0900 ?Gross per 24 hour  ?Intake 719.83 ml  ?Output 1400 ml  ?Net -680.17 ml  ? ?Filed Weights  ? 12/19/21 0002 12/20/21 0314 12/21/21 0300  ?Weight: 87.7 kg 87 kg 87 kg  ? ? ?Examination: ? ?General exam: Appears calm and comfortable on minimal oxygen through nasal cannula. ?Respiratory system: No added sounds.   ?Cardiovascular system: S1 & S2 heard, regular. ? Pacemaker in place.  No edema. ?Gastrointestinal system: Abdomen is nondistended, soft and nontender. No organomegaly or  masses felt. Normal bowel sounds heard. ?Central nervous system: Alert and oriented. No focal neurological deficits. ?Extremities: Symmetric 5 x 5 power. ?Skin: No rashes, lesions or ulcers ?Psychiatry: Judgement and insight appear normal. Mood & affect appropriate.  ? ? ? ?Data Reviewed: I have personally reviewed following labs and imaging studies ? ?CBC: ?Recent Labs  ?Lab 12/17/21 ?1313  ?WBC 3.8*   ?NEUTROABS 2.3  ?HGB 11.1*  ?HCT 37.5  ?MCV 93.3  ?PLT 227  ? ?Basic Metabolic Panel: ?Recent Labs  ?Lab 12/17/21 ?1313 12/18/21 ?0335 12/19/21 ?0250 12/20/21 ?0206 12/21/21 ?8119  ?NA 140 140 139 140 137  ?K 4.5 3.4* 3.6 3.4* 3.7  ?CL 107 105 104 105 105  ?CO2 '25 26 25 26 22  '$ ?GLUCOSE 107* 91 114* 123* 112*  ?BUN '12 9 11 13 13  '$ ?CREATININE 1.00 0.99 0.97 1.04* 0.97  ?CALCIUM 9.4 9.0 8.8* 8.9 9.0  ? ?GFR: ?Estimated Creatinine Clearance: 73.8 mL/min (by C-G formula based on SCr of 0.97 mg/dL). ?Liver Function Tests: ?No results for input(s): AST, ALT, ALKPHOS, BILITOT, PROT, ALBUMIN in the last 168 hours. ?No results for input(s): LIPASE, AMYLASE in the last 168 hours. ?No results for input(s): AMMONIA in the last 168 hours. ?Coagulation Profile: ?No results for input(s): INR, PROTIME in the last 168 hours. ?Cardiac Enzymes: ?No results for input(s): CKTOTAL, CKMB, CKMBINDEX, TROPONINI in the last 168 hours. ?BNP (last 3 results) ?No results for input(s): PROBNP in the last 8760 hours. ?HbA1C: ?No results for input(s): HGBA1C in the last 72 hours. ?CBG: ?Recent Labs  ?Lab 12/17/21 ?2123  ?GLUCAP 93  ? ?Lipid Profile: ?No results for input(s): CHOL, HDL, LDLCALC, TRIG, CHOLHDL, LDLDIRECT in the last 72 hours. ?Thyroid Function Tests: ?No results for input(s): TSH, T4TOTAL, FREET4, T3FREE, THYROIDAB in the last 72 hours. ?Anemia Panel: ?No results for input(s): VITAMINB12, FOLATE, FERRITIN, TIBC, IRON, RETICCTPCT in the last 72 hours. ?Sepsis Labs: ?No results for input(s): PROCALCITON, LATICACIDVEN in the last 168 hours. ? ?No results found for this or any previous visit (from the past 240 hour(s)).  ? ? ? ? ? ?Radiology Studies: ?No results found. ? ? ? ? ? ?Scheduled Meds: ? amiodarone  200 mg Oral BID  ? apixaban  5 mg Oral BID  ? atorvastatin  80 mg Oral Daily  ? dapagliflozin propanediol  10 mg Oral Daily  ? doxycycline  100 mg Oral Q12H  ? [START ON 12/22/2021] furosemide  40 mg Oral BID  ? [START ON 12/22/2021]  metoprolol succinate  100 mg Oral Daily  ? metoprolol tartrate  25 mg Oral Q8H  ? potassium chloride  40 mEq Oral BID  ? sacubitril-valsartan  1 tablet Oral BID  ? sodium chloride flush  3 mL Intravenous Q12H  ? ?Continuous Infusions: ? sodium chloride    ? cefTRIAXone (ROCEPHIN)  IV 2 g (12/20/21 2127)  ? ? ? LOS: 3 days  ? ? ?Time spent: 35 minutes ? ? ? ?Barb Merino, MD ?[facility] ?Pager [phone] ? ?

## 2021-12-21 NOTE — Progress Notes (Signed)
? ?Progress Note ? ?Patient Name: Ashley [name] ?Date of Encounter: 12/21/2021 ? ?[facility] Cardiologist: Dorris Carnes, MD  ? ?Subjective  ? ?Feeling better.  Breathing improving.   ? ?Inpatient Medications  ?  ?Scheduled Meds: ? apixaban  5 mg Oral BID  ? atorvastatin  80 mg Oral Daily  ? dapagliflozin propanediol  10 mg Oral Daily  ? doxycycline  100 mg Oral Q12H  ? furosemide  60 mg Intravenous BID  ? metoprolol tartrate  25 mg Oral Q8H  ? potassium chloride  40 mEq Oral BID  ? sacubitril-valsartan  1 tablet Oral BID  ? sodium chloride flush  3 mL Intravenous Q12H  ? ?Continuous Infusions: ? sodium chloride    ? amiodarone 30 mg/hr (12/21/21 0347)  ? cefTRIAXone (ROCEPHIN)  IV 2 g (12/20/21 2127)  ? ?PRN Meds: ?sodium chloride, acetaminophen, ondansetron (ZOFRAN) IV, sodium chloride flush  ? ?Vital Signs  ?  ?Vitals:  ? 12/20/21 1052 12/20/21 2120 12/21/21 0055 12/21/21 0300  ?BP: (!) 124/56 (!) 129/106 (!) 134/59   ?Pulse: 60     ?Resp: 17     ?Temp: 98.5 ?F (36.9 ?C) 98.7 ?F (37.1 ?C) 98.5 ?F (36.9 ?C)   ?TempSrc: Oral Oral Oral   ?SpO2: 96%     ?Weight:    87 kg  ?Height:      ? ? ?Intake/Output Summary (Last 24 hours) at 12/21/2021 1059 ?Last data filed at 12/21/2021 0900 ?Gross per 24 hour  ?Intake 719.83 ml  ?Output 1400 ml  ?Net -680.17 ml  ? ? ?  12/21/2021  ?  3:00 AM 12/20/2021  ?  3:14 AM 12/19/2021  ? 12:02 AM  ?Last 3 Weights  ?Weight (lbs) 191 lb 11.2 oz 191 lb 12.8 oz 193 lb 4.8 oz  ?Weight (kg) 86.955 kg 87 kg 87.68 kg  ?   ? ?Telemetry  ?  ? Atrial fibrillation.  Now APVS and ASVP.  4 NSVT- Personally Reviewed ? ?ECG  ?  ?Atrial tachycardia.  Frequent ventricular pacing - Personally Reviewed ? ?Physical Exam  ? ?VS:  BP (!) 134/59   Pulse 60   Temp 98.5 ?F (36.9 ?C) (Oral)   Resp 17   Ht '5\' 8"'$  (1.727 m)   Wt 87 kg   LMP 05/18/2015 Comment: went for 3 week after skipping two months   SpO2 96%   BMI 29.15 kg/m?  , BMI Body mass index is 29.15 kg/m?. ?GENERAL:  Well appearing ?HEENT: Pupils  equal round and reactive, fundi not visualized, oral mucosa unremarkable ?NECK:  no jugular venous distention, waveform within normal limits, carotid upstroke brisk and symmetric, no bruits, no thyromegaly ?LUNGS:  CTAB. ?HEART:  Mostly regular with ectopy.  PMI not displaced or sustained,S1 and S2 within normal limits, no S3, no S4, no clicks, no rubs, no murmurs ?ABD:  Flat, positive bowel sounds normal in frequency in pitch, no bruits, no rebound, no guarding, no midline pulsatile mass, no hepatomegaly, no splenomegaly ?EXT:  2 plus pulses throughout, trace R>L LE edema, no cyanosis no clubbing ?SKIN:  No rashes no nodules ?NEURO:  Cranial nerves II through XII grossly intact, motor grossly intact throughout ?PSYCH:  Cognitively intact, oriented to person place and time ? ? ?Labs  ?  ?High Sensitivity Troponin:   ?Recent Labs  ?Lab 12/04/21 ?2141 12/05/21 ?0412  ?TROPONINIHS 21* 24*  ?   ?Chemistry ?Recent Labs  ?Lab 12/19/21 ?0250 12/20/21 ?0206 12/21/21 ?9390  ?NA 139 140 137  ?  K 3.6 3.4* 3.7  ?CL 104 105 105  ?CO2 '25 26 22  '$ ?GLUCOSE 114* 123* 112*  ?BUN '11 13 13  '$ ?CREATININE 0.97 1.04* 0.97  ?CALCIUM 8.8* 8.9 9.0  ?GFRNONAA >60 >60 >60  ?ANIONGAP '10 9 10  '$ ?  ?Lipids No results for input(s): CHOL, TRIG, HDL, LABVLDL, LDLCALC, CHOLHDL in the last 168 hours.  ?Hematology ?Recent Labs  ?Lab 12/17/21 ?1313  ?WBC 3.8*  ?RBC 4.02  ?HGB 11.1*  ?HCT 37.5  ?MCV 93.3  ?MCH 27.6  ?MCHC 29.6*  ?RDW 17.9*  ?PLT 227  ? ?Thyroid No results for input(s): TSH, FREET4 in the last 168 hours.  ?BNP ?Recent Labs  ?Lab 12/17/21 ?1313  ?BNP 1,304.4*  ?  ?DDimer  ?Recent Labs  ?Lab 12/17/21 ?1313  ?DDIMER 17.29*  ?  ? ?Radiology  ?  ?No results found. ? ?Cardiac Studies  ? ?Echo 12/06/21 ? 1. Left ventricular ejection fraction, by estimation, is 30 to 35%. The  ?left ventricle has moderately decreased function. The left ventricle  ?demonstrates global hypokinesis. Left ventricular diastolic parameters are  ?indeterminate.  ? 2. Mildly  D-shaped interventricular septum suggests a degree of RV  ?pressure/volume overload. Right ventricular systolic function is  ?moderately reduced. The right ventricular size is moderately enlarged.  ?There is moderately elevated pulmonary artery  ?systolic pressure. The estimated right ventricular systolic pressure is  ?76.5 mmHg.  ? 3. Left atrial size was severely dilated.  ? 4. Right atrial size was moderately dilated.  ? 5. S/p mitral valve repair. The posterior leaflet appears severely  ?restricted. Mean gradient 9 mmHg with MVA 1.08 cm^2 by VTI suggests  ?moderate-severe mitral stenosis. At least moderate mitral regurgitation  ?(may not be fully visualized, thick CW doppler  ? jet).  ? 6. The tricuspid valve is abnormal. Tricuspid valve regurgitation is  ?severe.  ? 7. The aortic valve is tricuspid. There is mild calcification of the  ?aortic valve. Aortic valve regurgitation is trivial. Aortic valve  ?sclerosis/calcification is present, without any evidence of aortic  ?stenosis.  ? 8. The inferior vena cava is dilated in size with <50% respiratory  ?variability, suggesting right atrial pressure of 15 mmHg. ?  ? ?Patient Profile  ?   ?56 y.o. female with paroxysmal atrial fibrillation, atrial tachycardia, s/p ST Jude PPM, CAD s/p CABG, mitral valve prolapse s/p repair 4650, chronic systolic and diastolic heart failure admitted with acute on chronic systolic and diastolic heart failure  ? ?Assessment & Plan  ?  ?# Acute on chronic systolic and diastolic heart failure:  ?# Severe MR/TR: ?# Moderate-severe MS: ?Volume status has improved.  She is nearly euvolemic.  We will transition Lasix to 40 mg p.o. twice daily.  At home she was taking only 20 mg as needed.  This hospitalization her amlodipine was discontinued.  She was started on Entresto and metoprolol.  She was also started on Farxiga.  We will consolidate the metoprolol to 100 mg of succinate daily.  She will need a repeat echo in a few months to reassess  her heart failure and valvular heart disease.  She had moderate to severe stenosis of her repaired mitral valve with a mean gradient of 9 mmHg.  She also had at least moderate mitral regurgitation and severe TR.  She may need to be followed by the structural heart team as an outpatient. ? ?# Paroxysmal atrial fibrillation:  ?Converted to sinus rhythm on IV amiodarone. EP is seeing her.  Yesterday she had  some atrial tachycardia.  Today she is back in sinus rhythm and has atrial and ventricular pacing.  Continue amiodarone and Eliquis.  We will convert amiodarone back to 200 mg p.o. twice daily.  We will also consolidate metoprolol as above. ? ?# Sinus node dsyfunction:  ?S/s St. Jude PPM. ? ?# CAD:  ?# Hyperlipidemia:  ?S/p CABG in 2013.  Not an active issue.  Continue atorvastatin and metoprolol. ?   ? ?For questions or updates, please contact [place] ?Please consult [email or web address] for contact info under  ? ?  ?   ?Signed, ?Skeet Latch, MD  ?12/21/2021, 10:59 AM   ? ?

## 2021-12-21 NOTE — TOC Benefit Eligibility Note (Signed)
Patient Advocate Encounter ? ?Prior Authorization for Farxiga 10 mg has been approved.   ? ?PA# [id number] ?Effective dates: 12/21/2021 through 12/22/2022 ? ?Patients co-pay is $4.00.  ? ? ? ?Lyndel Safe, CPhT ?Pharmacy Patient Advocate Specialist ?[place] Patient Advocate Team ?Direct Number: [phone]  Fax: [phone]  ?

## 2021-12-21 NOTE — TOC Benefit Eligibility Note (Signed)
Patient Advocate Encounter ? ?Prior Authorization for Entresto 24-26 mg has been approved.   ? ?PA# [id number] ?Effective dates: 12/21/2021 through 12/22/2022 ? ?Patients co-pay is $4.00.  ? ? ? ?Lyndel Safe, CPhT ?Pharmacy Patient Advocate Specialist ?[place] Patient Advocate Team ?Direct Number: [phone]  Fax: [phone]  ?

## 2021-12-21 NOTE — TOC Transition Note (Signed)
Transition of Care (TOC) - CM/SW Discharge Note ? ? ?Patient Details  ?Name: Izzabella Besse Adamik ?MRN: [id number] ?Date of Birth: 1966/02/15 ? ?Transition of Care (TOC) CM/SW Contact:  ?Zenon Mayo, RN ?Phone Number: ?12/21/2021, 4:33 PM ? ? ?Clinical Narrative:    ?From home,indep, amio drip changed to po, plan for dc tomorrow , she states her daughter or son will transport her home in the morning.  She has no needs.  ? ? ?  ?  ? ? ?Patient Goals and CMS Choice ?  ?  ?  ? ?Discharge Placement ?  ?           ?  ?  ?  ?  ? ?Discharge Plan and Services ?  ?  ?           ?  ?  ?  ?  ?  ?  ?  ?  ?  ?  ? ?Social Determinants of Health (SDOH) Interventions ?Food Insecurity Interventions: Intervention Not Indicated ?Housing Interventions: Intervention Not Indicated ?Transportation Interventions: Intervention Not Indicated ? ? ?Readmission Risk Interventions ?   ? View : No data to display.  ?  ?  ?  ? ? ? ? ? ?

## 2021-12-22 ENCOUNTER — Other Ambulatory Visit (HOSPITAL_COMMUNITY): Payer: Self-pay

## 2021-12-22 DIAGNOSIS — I2510 Atherosclerotic heart disease of native coronary artery without angina pectoris: Secondary | ICD-10-CM | POA: Diagnosis not present

## 2021-12-22 DIAGNOSIS — I4891 Unspecified atrial fibrillation: Secondary | ICD-10-CM | POA: Diagnosis not present

## 2021-12-22 DIAGNOSIS — I5043 Acute on chronic combined systolic (congestive) and diastolic (congestive) heart failure: Secondary | ICD-10-CM | POA: Diagnosis not present

## 2021-12-22 DIAGNOSIS — R9389 Abnormal findings on diagnostic imaging of other specified body structures: Secondary | ICD-10-CM | POA: Diagnosis not present

## 2021-12-22 DIAGNOSIS — I10 Essential (primary) hypertension: Secondary | ICD-10-CM | POA: Diagnosis not present

## 2021-12-22 DIAGNOSIS — Z9889 Other specified postprocedural states: Secondary | ICD-10-CM | POA: Diagnosis not present

## 2021-12-22 LAB — BASIC METABOLIC PANEL
Anion gap: 8 (ref 5–15)
BUN: 15 mg/dL (ref 6–20)
CO2: 23 mmol/L (ref 22–32)
Calcium: 9.1 mg/dL (ref 8.9–10.3)
Chloride: 108 mmol/L (ref 98–111)
Creatinine, Ser: 0.96 mg/dL (ref 0.44–1.00)
GFR, Estimated: >60 mL/min (ref >60)
Glucose, Bld: 101 mg/dL — ABNORMAL HIGH (ref 70–99)
Potassium: 4.3 mmol/L (ref 3.5–5.1)
Sodium: 139 mmol/L (ref 135–145)

## 2021-12-22 MED ORDER — DAPAGLIFLOZIN PROPANEDIOL 10 MG PO TABS
10.0000 mg | ORAL_TABLET | Freq: Every day | ORAL | 0 refills | Status: DC
  Filled 2021-12-22: qty 30, 30d supply, fill #0

## 2021-12-22 MED ORDER — FUROSEMIDE 40 MG PO TABS
40.0000 mg | ORAL_TABLET | Freq: Two times a day (BID) | ORAL | 0 refills | Status: DC
  Filled 2021-12-22: qty 60, 30d supply, fill #0

## 2021-12-22 MED ORDER — METOPROLOL SUCCINATE ER 100 MG PO TB24
100.0000 mg | ORAL_TABLET | Freq: Every day | ORAL | 0 refills | Status: DC
  Filled 2021-12-22: qty 30, 30d supply, fill #0

## 2021-12-22 MED ORDER — SACUBITRIL-VALSARTAN 24-26 MG PO TABS
1.0000 | ORAL_TABLET | Freq: Two times a day (BID) | ORAL | 0 refills | Status: DC
  Filled 2021-12-22: qty 60, 30d supply, fill #0

## 2021-12-22 NOTE — Progress Notes (Signed)
? ?Progress Note ? ?Patient Name: Ashley [name] ?Date of Encounter: 12/22/2021 ? ?[facility] Cardiologist: Dorris Carnes, MD  ? ?Subjective  ? ?No complaints feels well. Hopes to go home soon.  ? ?Inpatient Medications  ?  ?Scheduled Meds: ? amiodarone  200 mg Oral BID  ? apixaban  5 mg Oral BID  ? atorvastatin  80 mg Oral Daily  ? dapagliflozin propanediol  10 mg Oral Daily  ? doxycycline  100 mg Oral Q12H  ? furosemide  40 mg Oral BID  ? metoprolol succinate  100 mg Oral Daily  ? sacubitril-valsartan  1 tablet Oral BID  ? sodium chloride flush  3 mL Intravenous Q12H  ? ?Continuous Infusions: ? sodium chloride    ? ?PRN Meds: ?sodium chloride, acetaminophen, ondansetron (ZOFRAN) IV, sodium chloride flush  ? ?Vital Signs  ?  ?Vitals:  ? 12/21/21 0055 12/21/21 0300 12/21/21 2155 12/21/21 2157  ?BP: (!) 134/59   127/80  ?Pulse:    60  ?Resp:      ?Temp: 98.5 ?F (36.9 ?C)  98 ?F (36.7 ?C)   ?TempSrc: Oral  Oral   ?SpO2:    96%  ?Weight:  87 kg    ?Height:      ? ? ?Intake/Output Summary (Last 24 hours) at 12/22/2021 0845 ?Last data filed at 12/22/2021 7124 ?Gross per 24 hour  ?Intake 100 ml  ?Output 1351 ml  ?Net -1251 ml  ? ? ?  12/21/2021  ?  3:00 AM 12/20/2021  ?  3:14 AM 12/19/2021  ? 12:02 AM  ?Last 3 Weights  ?Weight (lbs) 191 lb 11.2 oz 191 lb 12.8 oz 193 lb 4.8 oz  ?Weight (kg) 86.955 kg 87 kg 87.68 kg  ?   ? ?Telemetry  ?  ?Atrial pacing - Personally Reviewed ? ?ECG  ?  ?No new - Personally Reviewed ? ?Physical Exam  ? ?GEN: No acute distress.   ?Neck: No JVD sitting up in bed ?Cardiac: RRR, no murmurs, rubs, or gallops.  ?Respiratory: Clear to auscultation bilaterally. ?GI: Soft, nontender, non-distended  ?MS: tr edema on rt lower ext, no edema on left; No deformity. ?Neuro:  Nonfocal  ?Psych: Normal affect  ? ?Labs  ?  ?High Sensitivity Troponin:   ?Recent Labs  ?Lab 12/04/21 ?2141 12/05/21 ?0412  ?TROPONINIHS 21* 24*  ?   ?Chemistry ?Recent Labs  ?Lab 12/20/21 ?0206 12/21/21 ?5809 12/22/21 ?0327  ?NA 140 137  139  ?K 3.4* 3.7 4.3  ?CL 105 105 108  ?CO2 '26 22 23  '$ ?GLUCOSE 123* 112* 101*  ?BUN '13 13 15  '$ ?CREATININE 1.04* 0.97 0.96  ?CALCIUM 8.9 9.0 9.1  ?GFRNONAA >60 >60 >60  ?ANIONGAP '9 10 8  '$ ?  ?Lipids No results for input(s): CHOL, TRIG, HDL, LABVLDL, LDLCALC, CHOLHDL in the last 168 hours.  ?Hematology ?Recent Labs  ?Lab 12/17/21 ?1313  ?WBC 3.8*  ?RBC 4.02  ?HGB 11.1*  ?HCT 37.5  ?MCV 93.3  ?MCH 27.6  ?MCHC 29.6*  ?RDW 17.9*  ?PLT 227  ? ?Thyroid No results for input(s): TSH, FREET4 in the last 168 hours.  ?BNP ?Recent Labs  ?Lab 12/17/21 ?1313  ?BNP 1,304.4*  ?  ?DDimer  ?Recent Labs  ?Lab 12/17/21 ?1313  ?DDIMER 17.29*  ?  ? ?Radiology  ?  ?No results found. ? ?Cardiac Studies  ? ?Echo 12/06/21 ? 1. Left ventricular ejection fraction, by estimation, is 30 to 35%. The  ?left ventricle has moderately decreased function. The left ventricle  ?  demonstrates global hypokinesis. Left ventricular diastolic parameters are  ?indeterminate.  ? 2. Mildly D-shaped interventricular septum suggests a degree of RV  ?pressure/volume overload. Right ventricular systolic function is  ?moderately reduced. The right ventricular size is moderately enlarged.  ?There is moderately elevated pulmonary artery  ?systolic pressure. The estimated right ventricular systolic pressure is  ?61.6 mmHg.  ? 3. Left atrial size was severely dilated.  ? 4. Right atrial size was moderately dilated.  ? 5. S/p mitral valve repair. The posterior leaflet appears severely  ?restricted. Mean gradient 9 mmHg with MVA 1.08 cm^2 by VTI suggests  ?moderate-severe mitral stenosis. At least moderate mitral regurgitation  ?(may not be fully visualized, thick CW doppler  ? jet).  ? 6. The tricuspid valve is abnormal. Tricuspid valve regurgitation is  ?severe.  ? 7. The aortic valve is tricuspid. There is mild calcification of the  ?aortic valve. Aortic valve regurgitation is trivial. Aortic valve  ?sclerosis/calcification is present, without any evidence of aortic   ?stenosis.  ? 8. The inferior vena cava is dilated in size with <50% respiratory  ?variability, suggesting right atrial pressure of 15 mmHg. ?  ? ?Patient Profile  ?   ?56 y.o. female with paroxysmal atrial fibrillation, atrial tachycardia, s/p ST Jude PPM, CAD s/p CABG, mitral valve prolapse s/p repair 0737, chronic systolic and diastolic heart failure admitted with acute on chronic systolic and diastolic heart failure.  ? ?Assessment & Plan  ?  ?# Acute on chronic systolic and diastolic heart failure:  ?# Severe MR/TR: ?# Moderate-severe MS: ?Volume status has improved. Neg 4.4 L this admit though not much change in wt.  She is nearly euvolemic.  We will transition Lasix to 40 mg p.o. twice daily.  At home she was taking only 20 mg as needed.  This hospitalization her amlodipine was discontinued.  She was started on Entresto and metoprolol.  She was also started on Farxiga.  We will consolidate the metoprolol to 100 mg of succinate daily.  She will need a repeat echo in a few months to reassess her heart failure and valvular heart disease.  She had moderate to severe stenosis of her repaired mitral valve with a mean gradient of 9 mmHg.  She also had at least moderate mitral regurgitation and severe TR.  She may need to be followed by the structural heart team as an outpatient. ? ?# Paroxysmal atrial fibrillation:  ?Converted to sinus rhythm on IV amiodarone. EP is seeing her.  Yesterday she had some atrial tachycardia.  Today she is back in sinus rhythm and has atrial and ventricular pacing.  Continue amiodarone and Eliquis.  We will convert amiodarone back to 200 mg p.o. twice daily.  We will also consolidate metoprolol as above. ?-has appt with EP PA 12/28/21  ? ?# Sinus node dsyfunction:  ?S/s St. Jude PPM. ?  ?# CAD:  ?# Hyperlipidemia:  ?S/p CABG in 2013.  Not an active issue.  Continue atorvastatin and metoprolol. ?   ?   ? ?For questions or updates, please contact [place] ?Please consult [email or web address]  for contact info under  ? ?  ?   ?Signed, ?Cecilie Kicks, NP  ?12/22/2021, 8:45 AM   ? ?

## 2021-12-22 NOTE — Discharge Summary (Signed)
?Physician Discharge Summary ?  ?Patient: Ashley [name] MRN: [id number] DOB: [birth year]  ?Admit date:     12/17/2021  ?Discharge date: 12/22/21  ?Discharge Physician: Jimmy Picket Fallon Haecker  ? ?PCP: [name], MD  ? ?Recommendations at discharge:  ? ? Patient was placed on metoprolol, entresto and empagliflozin for heart failure ?Continue amiodarone 200 mg bid and anticoagulation with apixaban ?Increased furosemide to 40 mg twice daily.   ? ?Discharge Diagnoses: ?Principal Problem: ?  Acute on chronic combined systolic and diastolic CHF (congestive heart failure) ([place]) ?Active Problems: ?  Atrial fibrillation with RVR ([place]) ?  Abnormal CT of the chest ?  CAD (coronary artery disease) ?  Essential hypertension ?  Pacemaker ?  S/P mitral valve repair ?  S/P CABG x 4 ?  Cardiomyopathy ([place]) ?  CHF exacerbation ([place]) ? ?Resolved Problems: ?  * No resolved hospital problems. * ? ?Hospital Course: ?No notes on file ? ?Assessment and Plan: ?* Acute on chronic combined systolic and diastolic CHF (congestive heart failure) ([place]) ?Patient was admitted to the cardiac unit and received aggressive diuresis with IV furosemide, negative fluid balance has achieved, -4,240 ml, with improvement in her symptoms.  ?Patient sp mitral valve repair in the past.  ? ?Echocardiogram showed reduction in systolic function 30 to 28% with global hypokinesis. Mildly D shaped interventricular septum, moderate reduction in RV systolic function, moderate RV enlargement, RVSP 58.3 mmHg. Severe left atrial dilatation. Moderate to severe mitral stenosis with mean gradient 9 mmHg. Severe tricuspid valve regurgitation.  ? ?Patient will continue heart failure management with entresto and metoprolol.  ?Follow up with structural hear team as outpatient.  ? ?Atrial fibrillation with RVR ([place]) ?Patient was placed on IV amiodarone with improvement in rate control. Patient converted to sinus rhythm. ?Continue rate and rhythm control with metoprolol  and amiodarone, anticoagulation with apixaban.  ? ?Sinus node dysfunction, pacer in place.  ? ?CAD (coronary artery disease) ?No acute coronary syndrome. ?Continue with metoprolol and statin therapy.  ? ?Abnormal CT of the chest ?Patient was treated for community acquired pneumonia with antibiotic therapy.  ?Plan to follow up with pulmonary as outpatient.  ? ?Essential hypertension ?Continue blood pressure control with entresto and metoprolol.  ?Diuresis with furosemide.  ? ? ? ? ?  ? ? ?Consultants: cardiology  ?Procedures performed: none   ?Disposition: Home ?Diet recommendation:  ?Cardiac diet ?DISCHARGE MEDICATION: ?Allergies as of 12/22/2021   ? ?   Reactions  ? Azithromycin Nausea Only  ? Latex Rash  ? Spinach Hives  ? Tape Other (See Comments)  ? PLEASE USE PAPER TAPE!! AFFECTS SKIN BADLY!!!!  ? ?  ? ?  ?Medication List  ?  ? ?STOP taking these medications   ? ?amLODipine 10 MG tablet ?Commonly known as: NORVASC ?  ?losartan 50 MG tablet ?Commonly known as: COZAAR ?  ? ?  ? ?TAKE these medications   ? ?acetaminophen 500 MG tablet ?Commonly known as: TYLENOL ?Take 500-1,000 mg by mouth every 6 (six) hours as needed for headache. ?  ?amiodarone 200 MG tablet ?Commonly known as: PACERONE ?Take 1 tablet (200 mg total) by mouth 2 (two) times daily. ?  ?apixaban 5 MG Tabs tablet ?Commonly known as: ELIQUIS ?Take 1 tablet (5 mg total) by mouth 2 (two) times daily. ?  ?atorvastatin 80 MG tablet ?Commonly known as: LIPITOR ?Take 1 tablet (80 mg total) by mouth daily. ?  ?dapagliflozin propanediol 10 MG Tabs tablet ?Commonly known as: FARXIGA ?Take 1 tablet (  10 mg total) by mouth daily. ?Start taking on: Dec 23, 2021 ?  ?diphenhydrAMINE 25 MG tablet ?Commonly known as: BENADRYL ?Take 25 mg by mouth every 6 (six) hours as needed for allergies. ?  ?famotidine 20 MG tablet ?Commonly known as: PEPCID ?Take 20 mg by mouth daily as needed for indigestion. ?  ?ferrous sulfate 325 (65 FE) MG tablet ?Take 1 tablet (325 mg total)  by mouth daily with breakfast. ?  ?furosemide 40 MG tablet ?Commonly known as: LASIX ?Take 1 tablet (40 mg total) by mouth 2 (two) times daily. ?What changed:  ?medication strength ?See the new instructions. ?  ?metoprolol succinate 100 MG 24 hr tablet ?Commonly known as: TOPROL-XL ?Take 1 tablet (100 mg total) by mouth daily. Take with or immediately following a meal. ?Start taking on: Dec 23, 2021 ?  ?sacubitril-valsartan 24-26 MG ?Commonly known as: ENTRESTO ?Take 1 tablet by mouth 2 (two) times daily. ?  ?Saline Spray 0.2 % Soln ?Place 1 spray into both nostrils daily as needed (for congestion). ?  ?vitamin B-12 1000 MCG tablet ?Commonly known as: CYANOCOBALAMIN ?Take 1 tablet (1,000 mcg total) by mouth daily. ?  ?vitamin C 500 MG tablet ?Commonly known as: ASCORBIC ACID ?Take 1,000 mg by mouth 2 (two) times daily. ?  ? ?  ? ? Follow-up Information   ? ? [facility]. Go on 01/03/2022.   ?Specialty: Cardiology ?Why: at 9:00AM  ?call the number above for parking instructions close to CLinic ?this is in [facility] ?Contact information: ?[address] ?[id number] mc ?[place] ?[phone] ? ?  ?  ? ? Shirley Friar, PA-C Follow up on 12/28/2021.   ?Specialty: Physician Assistant ?Why: at 9:20 AM for pacer eval. ?Contact information: ?[address] ?[place] [address] ?[phone] ? ? ?  ?  ? ?  ?  ? ?  ? ?Discharge Exam: ?Filed Weights  ? 12/19/21 0002 12/20/21 0314 12/21/21 0300  ?Weight: 87.7 kg 87 kg 87 kg  ? ?BP (!) 124/54 (BP Location: Left Arm)   Pulse 66   Temp 98 ?F (36.7 ?C) (Oral)   Resp 18   Ht '5\' 8"'$  (1.727 m)   Wt 87 kg   LMP 05/18/2015 Comment: went for 3 week after skipping two months   SpO2 96%   BMI 29.15 kg/m?  ? ?Patient with no chest pain or dyspnea.  ? ?Neurology awake and alert ?ENT with no pallor ?Cardiovascular with S1 and S2 present diastolic murmur at the right sternal border ?No JVD ?No  lower extremity edema ?Respiratory with no wheezing or rales ?Abdomen not distended  ? ?Condition at discharge: stable ? ?The results of significant diagnostics from this hospitalization (including imaging, microbiology, ancillary and laboratory) are listed below for reference.  ? ?Imaging Studies: ?DG Chest 2 View ? ?Result Date: 12/17/2021 ?CLINICAL DATA:  Shortness of breath. EXAM: CHEST - 2 VIEW COMPARISON:  December 04, 2021. FINDINGS: Stable cardiomegaly. Status post coronary bypass graft. Left-sided pacemaker is unchanged in position. No acute pulmonary disease is noted. Bony thorax is unremarkable. IMPRESSION: No active cardiopulmonary disease. Electronically Signed   By: Marijo Conception M.D.   On: 12/17/2021 13:36  ? ?DG Chest 2 View ? ?Result Date: 12/04/2021 ?CLINICAL DATA:  Shortness of breath. EXAM: CHEST - 2 VIEW COMPARISON:  June 26, 2019 FINDINGS: There is a dual lead AICD. Multiple sternal wires and vascular clips are seen. Stable, chronic  appearing increased lung markings are noted without evidence of focal consolidation, pleural effusion or pneumothorax. The cardiac silhouette is mildly enlarged and unchanged in size. There is moderate severity calcification of the aortic arch. The visualized skeletal structures are unremarkable. IMPRESSION: 1. Evidence of prior median sternotomy/CABG. 2. Stable cardiomegaly with chronic appearing increased lung markings. 3. No acute or active cardiopulmonary disease. Electronically Signed   By: Virgina Norfolk M.D.   On: 12/04/2021 20:52  ? ?CT Angio Chest PE W/Cm &/Or Wo Cm ? ?Result Date: 12/17/2021 ?CLINICAL DATA:  Pulmonary embolism (PE) suspected, positive D-dimer. Atrial fibrillation with recent cardioversion EXAM: CT ANGIOGRAPHY CHEST WITH CONTRAST TECHNIQUE: Multidetector CT imaging of the chest was performed using the standard protocol during bolus administration of intravenous contrast. Multiplanar CT image reconstructions and MIPs were obtained to  evaluate the vascular anatomy. RADIATION DOSE REDUCTION: This exam was performed according to the departmental dose-optimization program which includes automated exposure control, adjustment of the mA and/or kV ac

## 2021-12-22 NOTE — Assessment & Plan Note (Signed)
Continue

## 2021-12-22 NOTE — Progress Notes (Signed)
Physical Therapy Treatment ?Patient Details ?Name: Ashley [name] Seats ?MRN: [id number] ?DOB: 1966/02/14 ?Today's Date: 12/22/2021 ? ? ?History of Present Illness This 56 yo female presened to the ED with worsening BLE edema and progressive dyspnea. Pt found to have acute on chronic combined systolic and diastolic CHF and abnormal CT of the chest 2cm area of either PNA vs inflammation vs possibly neoplasm seen on CT.  PHMx:A.Fib, CAD s/p CABG, HFrEF (30-35% as of 2 weeks ago), HTN. ? ?  ?PT Comments  ? ? Patient progressing well towards PT goals. Reports feeling better today with improved swelling in BLEs. Session focused on ambulation and stair training. Tolerated stair negotiation with supervision for safety, using rails for support due to first time performing but at home pt reports she has family to assist with stairs as needed. Noted to have 2/4 DOE with Sp02 >93% on RA with HR up to 119 bpm with activity. Discussed importance of energy conservation techniques, decreased salt intake, monitoring 02/HR and overall safe mobility esp when walking on uneven surfaces. Pt reports functioning at 80% of baseline, per report. Safe to d/c home from a mobility stand point. Will follow for higher level balance next session. ?  ?Recommendations for follow up therapy are one component of a multi-disciplinary discharge planning process, led by the attending physician.  Recommendations may be updated based on patient status, additional functional criteria and insurance authorization. ? ?Follow Up Recommendations ? No PT follow up ?  ?  ?Assistance Recommended at Discharge None  ?Patient can return home with the following   ?  ?Equipment Recommendations ? None recommended by PT  ?  ?Recommendations for Other Services   ? ? ?  ?Precautions / Restrictions Precautions ?Precautions: None ?Restrictions ?Weight Bearing Restrictions: No  ?  ? ?Mobility ? Bed Mobility ?Overal bed mobility: Independent ?  ?  ?  ?  ?  ?  ?  ?   ? ?Transfers ?Overall transfer level: Modified independent ?Equipment used: None ?  ?  ?  ?  ?  ?  ?  ?General transfer comment: Stood from EOB x1. ?  ? ?Ambulation/Gait ?Ambulation/Gait assistance: Supervision ?Gait Distance (Feet): 350 Feet ?Assistive device: None ?Gait Pattern/deviations: Step-through pattern, Wide base of support, Drifts right/left ?  ?Gait velocity interpretation: >2.62 ft/sec, indicative of community ambulatory ?  ?General Gait Details: Mostly steady gait with wide BoS and mild drifting. 2/4 DOE. Sp02 remained >93%. HR up to 119 bpm. ? ? ?Stairs ?Stairs: Yes ?Stairs assistance: Supervision ?Stair Management: Alternating pattern, Step to pattern, One rail Left ?Number of Stairs: 4 ?General stair comments: Cues for safety. Use of rail for support, has family support to assist at home. ? ? ?Wheelchair Mobility ?  ? ?Modified Rankin (Stroke Patients Only) ?  ? ? ?  ?Balance Overall balance assessment: Needs assistance ?Sitting-balance support: Feet supported, No upper extremity supported ?Sitting balance-Leahy Scale: Good ?  ?  ?Standing balance support: During functional activity ?Standing balance-Leahy Scale: Good ?Standing balance comment: Able to reach down to floor and pick up shoes without difficulty or LOB ?  ?  ?  ?  ?  ?  ?  ?  ?  ?  ?  ?  ? ?  ?Cognition Arousal/Alertness: Awake/alert ?Behavior During Therapy: [facility] for tasks assessed/performed ?Overall Cognitive Status: Within Functional Limits for tasks assessed ?  ?  ?  ?  ?  ?  ?  ?  ?  ?  ?  ?  ?  ?  ?  ?  ?  ?  ?  ? ?  ?  Exercises   ? ?  ?General Comments General comments (skin integrity, edema, etc.): Sp02 >93% on RA with activity and HR up to 119 bpm ?  ?  ? ?Pertinent Vitals/Pain Pain Assessment ?Pain Assessment: No/denies pain  ? ? ?Home Living   ?  ?  ?  ?  ?  ?  ?  ?  ?  ?   ?  ?Prior Function    ?  ?  ?   ? ?PT Goals (current goals can now be found in the care plan section) Progress towards PT goals: Progressing toward  goals ? ?  ?Frequency ? ? ? Min 2X/week ? ? ? ?  ?PT Plan Current plan remains appropriate  ? ? ?Co-evaluation   ?  ?  ?  ?  ? ?  ?AM-PAC PT "6 Clicks" Mobility   ?Outcome Measure ? Help needed turning from your back to your side while in a flat bed without using bedrails?: None ?Help needed moving from lying on your back to sitting on the side of a flat bed without using bedrails?: None ?Help needed moving to and from a bed to a chair (including a wheelchair)?: None ?Help needed standing up from a chair using your arms (e.g., wheelchair or bedside chair)?: None ?Help needed to walk in hospital room?: A Little ?Help needed climbing 3-5 steps with a railing? : A Little ?6 Click Score: 22 ? ?  ?End of Session Equipment Utilized During Treatment: Gait belt ?Activity Tolerance: Patient tolerated treatment well ?Patient left: in bed;with call bell/phone within reach ?Nurse Communication: Mobility status ?PT Visit Diagnosis: Other abnormalities of gait and mobility (R26.89) ?  ? ? ?Time: 9518-8416 ?PT Time Calculation (min) (ACUTE ONLY): 16 min ? ?Charges:  $Gait Training: 8-22 mins          ?          ? ?Marisa Severin, PT, DPT ?Acute Rehabilitation Services ?Secure chat preferred ?Office [phone] ? ? ? ? ? ?[place] ?12/22/2021, 8:15 AM ? ?

## 2021-12-22 NOTE — Hospital Course (Addendum)
Mrs. Reise was admitted to the hospital with the working diagnosis of decompensated heart failure in the setting of mitral valve stenosis.  ? ?56 yo female with the past medical history of atrial fibrillation, coronary artery disease, hypertension and heart failure who presented with dyspnea. Reported worsening lower extremity edema and progressive dyspnea. On her initial physical examination her blood pressure was 113/71, HR 102, RR 20, 02 saturation 94%. Lungs with no wheezing or rales, heart with S1 and S2 present irregularly irregular, abdomen not distended, and positive lower extremity edema. ? ?Na 140, K 4,5 CL 107, bicarbonate 25, glucose 107 bun 12 and cr 1,0  ?BNP 1,304  ?Wbc 3,8 hgb 11,9 plt 227  ?D dimer 17,29  ? ?Chest radiograph with cardiomegaly, bilateral hilar vascular congestion. Pacer with one atrial and one ventricular lead.  ? ?EKG 136 bpm, normal axis, right bundle branch block, sinus rhythm with PVC, noisy baseline, no significant ST segment or T wave changes.  ? ?CT chest with no pulmonary embolism, bilateral ground glass opacities. 2,0 cm nodule at the right upper lobe.  ?Lower extremity US negative for DVT.  ? ?Patient was placed on furosemide for diuresis.  ?Echocardiogram with severe mitral and tricuspid regurgitation, moderate to severe mitral stenosis.  ?Placed on IV amiodarone for rate control with good response.  ? ?Patient no transitioned to oral diuretic therapy and her heart failure regimen was adjusted.  ?

## 2021-12-23 ENCOUNTER — Telehealth: Payer: Self-pay

## 2021-12-23 LAB — CUP PACEART REMOTE DEVICE CHECK
Battery Remaining Longevity: 88 mo
Battery Remaining Percentage: 79 %
Battery Voltage: 3.01 V
Brady Statistic AP VP Percent: 5.3 %
Brady Statistic AP VS Percent: 14 %
Brady Statistic AS VP Percent: 11 %
Brady Statistic AS VS Percent: 65 %
Brady Statistic RA Percent Paced: 15 %
Brady Statistic RV Percent Paced: 16 %
Date Time Interrogation Session: 20230503120931
Implantable Lead Implant Date: 20201103
Implantable Lead Implant Date: 20201103
Implantable Lead Location: 753859
Implantable Lead Location: 753860
Implantable Pulse Generator Implant Date: 20201103
Lead Channel Impedance Value: 430 Ohm
Lead Channel Impedance Value: 530 Ohm
Lead Channel Pacing Threshold Amplitude: 0.875 V
Lead Channel Pacing Threshold Amplitude: 1.125 V
Lead Channel Pacing Threshold Pulse Width: 0.5 ms
Lead Channel Pacing Threshold Pulse Width: 0.5 ms
Lead Channel Sensing Intrinsic Amplitude: 12 mV
Lead Channel Sensing Intrinsic Amplitude: 4.4 mV
Lead Channel Setting Pacing Amplitude: 1.125
Lead Channel Setting Pacing Amplitude: 2 V
Lead Channel Setting Pacing Pulse Width: 0.5 ms
Lead Channel Setting Sensing Sensitivity: 2 mV
Pulse Gen Model: 2272
Pulse Gen Serial Number: 9174760

## 2021-12-23 NOTE — Telephone Encounter (Signed)
Transition Care Management Follow-up Telephone Call ?Date of discharge and from where: 12/22/2021, [facility] ?How have you been since you were released from the hospital? She stated that she is feeling better today.  ?Any questions or concerns? No ? ?Items Reviewed: ?Did the pt receive and understand the discharge instructions provided? Yes  ?Medications obtained and verified? Yes  - she said she has all of her medications and did not have any questions about the med regime.  ?Other? No  ?Any new allergies since your discharge? No  ?Dietary orders reviewed? Yes ?Do you have support at home? Yes - family ? ?Home Care and Equipment/Supplies: ?Were home health services ordered? no ?If so, what is the name of the agency? N/a  ?Has the agency set up a time to come to the patient's home? not applicable ?Were any new equipment or medical supplies ordered?  No ?What is the name of the medical supply agency? N/a ?Were you able to get the supplies/equipment? not applicable ?Do you have any questions related to the use of the equipment or supplies? No ? ?She has a scale to track daily weights.  Weight this morning: 189.2 lbs ? ?Functional Questionnaire: (I = Independent and D = Dependent) ?ADLs: independent ? ? ?Follow up appointments reviewed: ? ?PCP Hospital f/u appt confirmed?  She said she will call the clinic to schedule an appointment  when she is ready.  She said she has multiple appointments already scheduled ?[facility] f/u appt confirmed? Yes  Scheduled to see cardiology - 12/28/2021, [place] - 01/03/2022 ?Are transportation arrangements needed? No  ?If their condition worsens, is the pt aware to call PCP or go to the Emergency Dept.? Yes ?Was the patient provided with contact information for the PCP's office or ED? Yes ?Was to pt encouraged to call back with questions or concerns? Yes ? ?

## 2021-12-28 ENCOUNTER — Encounter: Payer: Self-pay | Admitting: Student

## 2021-12-28 ENCOUNTER — Ambulatory Visit (INDEPENDENT_AMBULATORY_CARE_PROVIDER_SITE_OTHER): Payer: Medicaid Other | Admitting: Student

## 2021-12-28 VITALS — BP 140/80 | HR 60 | Ht 68.0 in | Wt 184.0 lb

## 2021-12-28 DIAGNOSIS — I25118 Atherosclerotic heart disease of native coronary artery with other forms of angina pectoris: Secondary | ICD-10-CM | POA: Diagnosis not present

## 2021-12-28 DIAGNOSIS — Z950 Presence of cardiac pacemaker: Secondary | ICD-10-CM | POA: Diagnosis not present

## 2021-12-28 DIAGNOSIS — R600 Localized edema: Secondary | ICD-10-CM | POA: Diagnosis not present

## 2021-12-28 DIAGNOSIS — I4891 Unspecified atrial fibrillation: Secondary | ICD-10-CM

## 2021-12-28 LAB — CUP PACEART INCLINIC DEVICE CHECK
Battery Remaining Longevity: 86 mo
Battery Voltage: 3.01 V
Brady Statistic RA Percent Paced: 23 %
Brady Statistic RV Percent Paced: 19 %
Date Time Interrogation Session: 20230509100301
Implantable Lead Implant Date: 20201103
Implantable Lead Implant Date: 20201103
Implantable Lead Location: 753859
Implantable Lead Location: 753860
Implantable Pulse Generator Implant Date: 20201103
Lead Channel Impedance Value: 437.5 Ohm
Lead Channel Impedance Value: 550 Ohm
Lead Channel Pacing Threshold Amplitude: 0.875 V
Lead Channel Pacing Threshold Amplitude: 1.375 V
Lead Channel Pacing Threshold Pulse Width: 0.5 ms
Lead Channel Pacing Threshold Pulse Width: 0.5 ms
Lead Channel Sensing Intrinsic Amplitude: 12 mV
Lead Channel Sensing Intrinsic Amplitude: 5 mV
Lead Channel Setting Pacing Amplitude: 1.125
Lead Channel Setting Pacing Amplitude: 2.5 V
Lead Channel Setting Pacing Pulse Width: 0.5 ms
Lead Channel Setting Sensing Sensitivity: 2 mV
Pulse Gen Model: 2272
Pulse Gen Serial Number: 9174760

## 2021-12-28 LAB — COMPREHENSIVE METABOLIC PANEL
ALT: 22 IU/L (ref 0–32)
AST: 19 IU/L (ref 0–40)
Albumin/Globulin Ratio: 1.7 (ref 1.2–2.2)
Albumin: 4.4 g/dL (ref 3.8–4.9)
Alkaline Phosphatase: 118 IU/L (ref 44–121)
BUN/Creatinine Ratio: 14 (ref 9–23)
BUN: 16 mg/dL (ref 6–24)
Bilirubin Total: 0.9 mg/dL (ref 0.0–1.2)
CO2: 26 mmol/L (ref 20–29)
Calcium: 9.7 mg/dL (ref 8.7–10.2)
Chloride: 98 mmol/L (ref 96–106)
Creatinine, Ser: 1.14 mg/dL — ABNORMAL HIGH (ref 0.57–1.00)
Globulin, Total: 2.6 g/dL (ref 1.5–4.5)
Glucose: 102 mg/dL — ABNORMAL HIGH (ref 70–99)
Potassium: 4.2 mmol/L (ref 3.5–5.2)
Sodium: 140 mmol/L (ref 134–144)
Total Protein: 7 g/dL (ref 6.0–8.5)
eGFR: 56 mL/min/{1.73_m2} — ABNORMAL LOW (ref >59)

## 2021-12-28 NOTE — Patient Instructions (Signed)
Medication Instructions:  ?Your physician has recommended you make the following change in your medication:  ? ?Continue Amiodarone twice daily for 1 week then decrease to 1 tablet daily ? ?*If you need a refill on your cardiac medications before your next appointment, please call your pharmacy* ? ? ?Lab Work: ?TODAY: CMET ? ?If you have labs (blood work) drawn today and your tests are completely normal, you will receive your results only by: ?MyChart Message (if you have MyChart) OR ?A paper copy in the mail ?If you have any lab test that is abnormal or we need to change your treatment, we will call you to review the results. ? ? ?Follow-Up: ?At [facility], you and your health needs are our priority.  As part of our continuing mission to provide you with exceptional heart care, we have created designated Provider Care Teams.  These Care Teams include your primary Cardiologist (physician) and Advanced Practice Providers (APPs -  Physician Assistants and Nurse Practitioners) who all work together to provide you with the care you need, when you need it. ? ? ?Your next appointment:   ?2 month(s) ? ?The format for your next appointment:   ?In Person ? ?Provider:   ?Virl Axe, MD{ ? ?  ?

## 2022-01-02 NOTE — H&P (View-Only) (Signed)
HEART & VASCULAR TRANSITION OF CARE CONSULT NOTE     Referring Physician: Dr. Cathlean Sauer Primary Care: Dr. Margarita Rana Primary Cardiologist: Dr. Irish Lack  HPI: Referred to clinic by Dr. Cathlean Sauer with [facility] for heart failure consultation. 56 y.o. female with history of CAD s/p CABG in 2013, MVP with severe MR s/p MV repair at time of CABG, chronic systolic CHF, sinus node dysfunction s/p St. 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   Found to be in atrial flutter with RVR 10/2018. EF down to 30% on echo. Underwent DCCV 01/2019. She had recurrence of AF but EF had improved to 60-65% on subsequent echo 07/20.   Echo 01/23: EF 50-55%  Has been on amiodarone for atrial arrhythmias. Amiodarone increased 03/23 d/t atrial tachycardia.   Admitted 04/23 with AT with RVR and volume overload. Attempts to pace out were unsuccessful and she underwent DCCV. She was readmitted about 10 days later with recurrent AF and volume overload. She was placed on amiodarone drip and converted to SR. Diuresed with IV lasix. Transitioned to P.O. lasxi 40 mg BID at discharge. Echo: EF 30-35%, RV moderately reduced, RVSP 58 mmHg, severe LAE, posterior leaflet mitral valve appears severely restricted, moderate to severe mitral stenosis with mean gradient 9 mmHg and moderate MR ( may not be fully visualized, thick CW doppler jet), severe TR  She reports feeling much better over the last couple of weeks. Weight is down about 30 lb over the last month, currently 182 lb on home scale. Dyspnea about 85% better. No orthopnea or PND. Notes a little bit of lower extremity edema at end of day (R>L), resolves by morning. Denies CP or palpitations. Doing well with fluid and sodium restriction, did have indiscretion yesterday for Mother's Day holiday.   Has about 3 alcoholic beverages weekly. Quit smoking April 2023.   Taking all meds as prescribed. Lives with her  daughter and son.     Review of Systems: [y] = yes, '[ ]'$  = no   General: Weight gain '[ ]'$ ; Weight loss [Y]; Anorexia '[ ]'$ ; Fatigue '[ ]'$ ; Fever '[ ]'$ ; Chills '[ ]'$ ; Weakness '[ ]'$   Cardiac: Chest pain/pressure '[ ]'$ ; Resting SOB '[ ]'$ ; Exertional SOB [Y]; Orthopnea '[ ]'$ ; Pedal Edema '[ ]'$ ; Palpitations '[ ]'$ ; Syncope '[ ]'$ ; Presyncope '[ ]'$ ; Paroxysmal nocturnal dyspnea'[ ]'$   Pulmonary: Cough '[ ]'$ ; Wheezing'[ ]'$ ; Hemoptysis'[ ]'$ ; Sputum '[ ]'$ ; Snoring '[ ]'$   GI: Vomiting'[ ]'$ ; Dysphagia'[ ]'$ ; Melena'[ ]'$ ; Hematochezia '[ ]'$ ; Heartburn'[ ]'$ ; Abdominal pain '[ ]'$ ; Constipation '[ ]'$ ; Diarrhea '[ ]'$ ; BRBPR '[ ]'$   GU: Hematuria'[ ]'$ ; Dysuria '[ ]'$ ; Nocturia'[ ]'$   Vascular: Pain in legs with walking '[ ]'$ ; Pain in feet with lying flat '[ ]'$ ; Non-healing sores '[ ]'$ ; Stroke [Y]; TIA '[ ]'$ ; Slurred speech '[ ]'$ ;  Neuro: Headaches'[ ]'$ ; Vertigo'[ ]'$ ; Seizures'[ ]'$ ; Paresthesias'[ ]'$ ;Blurred vision '[ ]'$ ; Diplopia '[ ]'$ ; Vision changes '[ ]'$   Ortho/Skin: Arthritis '[ ]'$ ; Joint pain '[ ]'$ ; Muscle pain '[ ]'$ ; Joint swelling '[ ]'$ ; Back Pain '[ ]'$ ; Rash '[ ]'$   Psych: Depression'[ ]'$ ; Anxiety'[ ]'$   Heme: Bleeding problems '[ ]'$ ; Clotting disorders '[ ]'$ ; Anemia '[ ]'$   Endocrine: Diabetes '[ ]'$ ; Thyroid dysfunction'[ ]'$    Past Medical History:  Diagnosis Date   Abdominal pain    ? chronic cholecystitis; admxn to [facility] 9/12 (CT, USN, HIDA done)   Atrial fibrillation ([place])    Cardiomyopathy    echo  9/12: mild LVH, EF 35-40%, mod MR (difficult to judge /post leaflet restricted), mod BAE, mod RVE, mod TR    Cardiomyopathy ([place])    a. echo 9/12: mild LVH, EF 35-40%, mod MR (difficult to judge /post leaflet restricted), mod BAE, mod RVE, mod TR. b. Echo 11/2011: mild LVH, EF 55-60%, s/p MV repair w/o sig MR/MS, mildly dilated RV/RA, PASP 34mHg. // AFL dx 10/2018 - Echo 10/2018: EF 30, diff HK, s/p MV repair with trivial MR, mean MV 7 but not c/w significant mitral stenosis, mild RAE, severe LAE, mod reduced RVSF    Carotid artery disease ([place]    a. Duplex 06/2015: 50% RECA, 1-39% BICA.   CHF (congestive heart  failure) (HCC)    Clotting disorder (HCC)    Coronary artery disease    a. s/p CABG 10/2011 - LIMA-LAD, SVG-PDA, SVG-OM, SVG-diagonal.   Dyslipidemia    Essential hypertension    Heart murmur    Hemifacial spasm    History of Doppler ultrasound    [facility] [place] [place] [facility]: no hemodynamically significant ICA stenosis   Hyperlipidemia    Mitral regurgitation    a. s/p MV repair 10/2011 - on Coumadin for 3 months afterwards then d/c'd by surgery.   Recurrent upper respiratory infection (URI)    S/P CABG x 4 10/21/2011   S/P mitral valve repair 10/21/2011   [place] ([place] 1989   a. age 56- in setting of cigs and OCPs. //  b. s/p acute L [facility] [place] [place]  [facility]   Tobacco abuse     Current Outpatient Medications  Medication Sig Dispense Refill   acetaminophen (TYLENOL) 500 MG tablet Take 500-1,000 mg by mouth every 6 (six) hours as needed for headache.     amiodarone (PACERONE) 200 MG tablet Take 200 mg by mouth daily.     apixaban (ELIQUIS) 5 MG TABS tablet Take 1 tablet (5 mg total) by mouth 2 (two) times daily. 180 tablet 3   atorvastatin (LIPITOR) 80 MG tablet Take 1 tablet (80 mg total) by mouth daily. 90 tablet 3   dapagliflozin propanediol (FARXIGA) 10 MG TABS tablet Take 1 tablet (10 mg total) by mouth daily. 30 tablet 0   diphenhydrAMINE (BENADRYL) 25 MG tablet Take 25 mg by mouth every 6 (six) hours as needed for allergies.     famotidine (PEPCID) 20 MG tablet Take 20 mg by mouth daily as needed for indigestion.      ferrous sulfate 325 (65 FE) MG tablet Take 1 tablet (325 mg total) by mouth daily with breakfast. 30 tablet 3   furosemide (LASIX) 40 MG tablet Take 1 tablet (40 mg total) by mouth 2 (two) times daily. 60 tablet 0   metoprolol succinate (TOPROL-XL) 100 MG 24 hr tablet Take 1 tablet (100 mg total) by mouth daily. Take with or immediately following a meal. 30 tablet 0    sacubitril-valsartan (ENTRESTO) 24-26 MG Take 1 tablet by mouth 2 (two) times daily. 60 tablet 0   Saline 0.2 % SOLN Place 1 spray into both nostrils daily as needed (for congestion).     spironolactone (ALDACTONE) 25 MG tablet Take 1 tablet (25 mg total) by mouth daily. 30 tablet 2   vitamin B-12 (CYANOCOBALAMIN) 1000 MCG tablet Take 1 tablet (1,000 mcg total) by mouth daily.     vitamin C (ASCORBIC ACID) 500 MG tablet Take 1,000  mg by mouth 2 (two) times daily.      Current Facility-Administered Medications  Medication Dose Route Frequency Provider Last Rate Last Admin   incobotulinumtoxinA (XEOMIN) 50 units injection 50 Units  50 Units Intramuscular Q90 days Marcial Pacas, MD   50 Units at 11/30/16 1546   incobotulinumtoxinA (XEOMIN) 50 units injection 50 Units  50 Units Intramuscular Q90 days Marcial Pacas, MD   50 Units at 07/25/17 0843   incobotulinumtoxinA (XEOMIN) 50 units injection 50 Units  50 Units Intramuscular Q90 days Marcial Pacas, MD   50 Units at 07/26/17 1217    Allergies  Allergen Reactions   Azithromycin Nausea Only   Latex Rash   Spinach Hives   Tape Other (See Comments)    PLEASE USE PAPER TAPE!! AFFECTS SKIN BADLY!!!!      Social History   Socioeconomic History   Marital status: Widowed    Spouse name: Not on file   Number of children: 2   Years of education: Not on file   Highest education level: Bachelor's degree (e.g., BA, AB, BS)  Occupational History   Occupation: Payroll/Paperwork    Comment: 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   Occupation: Disability    Comment: since march 2023  Tobacco Use   Smoking status: Former    Packs/day: 1.00    Years: 30.00    Pack years: 30.00    Types: Cigarettes    Quit date: 05/07/2011    Years since quitting: 10.6   Smokeless tobacco: Never  Vaping Use   Vaping Use: Never used  Substance and Sexual Activity   Alcohol use: Yes    Alcohol/week: 1.0 standard drink    Types: 1 Cans of beer per week    Comment: can of  beer 1 x week   Drug use: No   Sexual activity: Yes  Other Topics Concern   Not on file  Social History Narrative   Lives at home with husband and son.   Right-handed.   2 cups caffeine daily.   Social Determinants of Health   Financial Resource Strain: Not on file  Food Insecurity: No Food Insecurity   Worried About Charity fundraiser in the Last Year: Never true   Ran Out of Food in the Last Year: Never true  Transportation Needs: No Transportation Needs   Lack of Transportation (Medical): No   Lack of Transportation (Non-Medical): No  Physical Activity: Not on file  Stress: Not on file  Social Connections: Not on file  Intimate Partner Violence: Not on file      Family History  Problem Relation Age of Onset   Hypertension Mother    Stroke Mother    Heart disease Father    COPD Father    Bone cancer Maternal Grandmother    Anesthesia problems Neg Hx    Hypotension Neg Hx    Malignant hyperthermia Neg Hx    Pseudochol deficiency Neg Hx    Colon cancer Neg Hx     Vitals:   01/03/22 0910  BP: 130/78  Pulse: 64  SpO2: 98%  Weight: 82.6 kg    PHYSICAL EXAM: General:  No distress. Ambulated into clinic.  HEENT: normal Neck: supple. no JVD. Carotids 2+ bilat; no bruits. No lymphadenopathy or thryomegaly appreciated. Cor: PMI nondisplaced. Regular rate & rhythm. No rubs, gallops, 2/6 HSM. Lungs: clear Abdomen: soft, nontender, nondistended.  Extremities: no cyanosis, clubbing, rash, trace edema Neuro: alert & oriented x 3, cranial nerves grossly intact. moves all  4 extremities w/o difficulty. Affect pleasant.   ASSESSMENT & PLAN: Chronic systolic CHF: -Echo 93/8182: EF 55-60% -Echo 10/2018: EF 30%. In setting of AFL with RVR -Echo 02/2019: EF 60-65% -Echo 01/23: EF 50-55% -Recurrent atrial arrhythmias end of January 2023 -Echo 04/23 in setting of atrial tach with RVR: EF 30-35%, mildly D-shaped interventicular spetum suggesting degree of RV overload, RV  moderately reduced, RVSP 58 mmHg, severe LAE, s/p MV repair, posterior leaflet appears severely restricted, mean gradient across MV 9 mmHg with MVA 1.08cm2 by VTI suggesting moderate to severe MS, at least moderate MR, severe TR, dilated IVC with estimated RAP 15 mmHg -Etiology not certain. ? D/t progressive valvular disease +/- tachymediated in setting of atrial arrhythmias. EF has been variable over the years, has previously recovered with restoration of SR. Reviewed her most recent echo with Dr. Haroldine Laws. Appears to have severe MR and possibly severely mitral stenosis.  -Scheduled for TEE with Dr. Audie Box. See discussion below. -Has known hx CAD and prior CABG. If requires intervention for mitral valve, will eventually require LHC. -NYHA IIIa. Volume looks okay on exam but ReDS 37%. Taking 40 mg furosemide BID. Will have her take an extra 40 mg for 2 days. -Continue Metoprolol XL 100 mg daily -Continue Entresto 24/26 mg BID -Continue Farxiga 10 mg daily -Add spiro 12.5 mg daily -BMET, BNP, CBC today. BMET again in 1 week.  2. CAD: -S/p CABG (LIMA to LAD, SVG to PDA, SVG to OM, SVG to diagonal) in 2013 -No aspirin with anticoagulation -Continue statin -Denies angina  3. S/p mitral valve repair with severe MR/MS: -Most recent echo reviewed with Dr. Haroldine Laws. Has severe MR and possibly severe mitral stenosis. Posterior leaflet restricted.  -Suspect severe MR contributing to atrial arrhythmias -Arranged for TEE with Dr. Audie Box -Refer to Structural Heart Team to see if appropriate candidate for transcatheter MV replacement (appointment after TEE completed). Otherwise, may need to be considered for surgical mitral valve replacement.  4. Tricuspid regurgitation: -Severe on echo 04/23  4. Atrial arrhythmias: -Maintaining SR on ECG today -Continue amiodarone 200 mg daily per EP -Anticoagulated with Eliquis 5 mg BID. May need to consider warfarin for anticoagulation if TEE confirms significant  mitral stenosis  5. Tobacco use: -Quit smoking cold [place] 04/23  6. HTN:  -BP stable -GDMT as above  7. HLD:  -On high-intensity statin  8. Iron deficiency anemia: -Received feraheme during admit 04/23 -Hgb 10.1 in 04/23.  -No overt bleeding -CBC today  NYHA IIIa GDMT  Diuretic-lasxi 40 BID BB-metoprolol xl 100 mg daily Ace/ARB/ARNI-entresto 24/26 BID MRA-adding spiro 12.5 mg daily SGLT2i-entresto 24/26 mg BID    Referred to HFSW (PCP, Medications, Transportation, ETOH Abuse, Drug Abuse, Insurance, Financial ): No Refer to Pharmacy: No Refer to Home Health: No Refer to Advanced Heart Failure Clinic: Yes  Refer to General Cardiology: No, establishing with Dr. Johney Frame July 2023  Follow up: [facility] June 8th with APP/Dr. Haroldine Laws, Dr. Johney Frame as scheduled July 2023. Referred to Structural Heart, Dr. Burt Knack.  Marlyce Huge PA-C  Patient seen and examined with the above-signed Advanced Practice Provider and/or Housestaff. I personally reviewed laboratory data, imaging studies and relevant notes. I independently examined the patient and formulated the important aspects of the plan. I have edited the note to reflect any of my changes or salient points. I have personally discussed the plan with the patient and/or family.  56 y/o female smoker with h/o CAD and MR s/p CABG/MVR in 2013. Multiple admits  for ADHF and AF with RVR recently. Echo with EF down to 30-35% with significant MR and apparent MS. RV strain  Now in NSR on amio.   NYHA III  General:  Sitting in chair No resp difficulty HEENT: normal Neck: supple. Jvp 7. Carotids 2+ bilat; no bruits. No lymphadenopathy or thryomegaly appreciated. Cor: PMI nondisplaced. Regular rate & rhythm. 2/6 MR Lungs: decreased throughout Abdomen: soft, nontender, nondistended. No hepatosplenomegaly. No bruits or masses. Good bowel sounds. Extremities: no cyanosis, clubbing, rash, edema Neuro: alert &  orientedx3, cranial nerves grossly intact. moves all 4 extremities w/o difficulty. Affect pleasant  Suspect major issue is recurrent MR +/- MS. Suspect she may need MVR. Will schedule TEE with Dr. Audie Box. Will likely need cath as well. Continue amio. Volume status mildly elevated (ReDS 37%) in setting of MR.   Will follow closely in HF Clinic.   Glori Bickers, MD  10:21 AM

## 2022-01-02 NOTE — Progress Notes (Addendum)
HEART & VASCULAR TRANSITION OF CARE CONSULT NOTE     Referring Physician: Dr. Cathlean Sauer Primary Care: Dr. Margarita Rana Primary Cardiologist: Dr. Irish Lack  HPI: Referred to clinic by Dr. Cathlean Sauer with [facility] for heart failure consultation. 56 y.o. female with history of CAD s/p CABG in 2013, MVP with severe MR s/p MV repair at time of CABG, chronic systolic CHF, sinus node dysfunction s/p 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   Found to be in atrial flutter with RVR 10/2018. EF down to 30% on echo. Underwent DCCV 01/2019. She had recurrence of AF but EF had improved to 60-65% on subsequent echo 07/20.   Echo 01/23: EF 50-55%  Has been on amiodarone for atrial arrhythmias. Amiodarone increased 03/23 d/t atrial tachycardia.   Admitted 04/23 with AT with RVR and volume overload. Attempts to pace out were unsuccessful and she underwent DCCV. She was readmitted about 10 days later with recurrent AF and volume overload. She was placed on amiodarone drip and converted to SR. Diuresed with IV lasix. Transitioned to P.O. lasxi 40 mg BID at discharge. Echo: EF 30-35%, RV moderately reduced, RVSP 58 mmHg, severe LAE, posterior leaflet mitral valve appears severely restricted, moderate to severe mitral stenosis with mean gradient 9 mmHg and moderate MR ( may not be fully visualized, thick CW doppler jet), severe TR  She reports feeling much better over the last couple of weeks. Weight is down about 30 lb over the last month, currently 182 lb on home scale. Dyspnea about 85% better. No orthopnea or PND. Notes a little bit of lower extremity edema at end of day (R>L), resolves by morning. Denies CP or palpitations. Doing well with fluid and sodium restriction, did have indiscretion yesterday for Mother's Day holiday.   Has about 3 alcoholic beverages weekly. Quit smoking April 2023.   Taking all meds as prescribed. Lives with her  daughter and son.     Review of Systems: [y] = yes, '[ ]'$  = no   General: Weight gain '[ ]'$ ; Weight loss [Y]; Anorexia '[ ]'$ ; Fatigue '[ ]'$ ; Fever '[ ]'$ ; Chills '[ ]'$ ; Weakness '[ ]'$   Cardiac: Chest pain/pressure '[ ]'$ ; Resting SOB '[ ]'$ ; Exertional SOB [Y]; Orthopnea '[ ]'$ ; Pedal Edema '[ ]'$ ; Palpitations '[ ]'$ ; Syncope '[ ]'$ ; Presyncope '[ ]'$ ; Paroxysmal nocturnal dyspnea'[ ]'$   Pulmonary: Cough '[ ]'$ ; Wheezing'[ ]'$ ; Hemoptysis'[ ]'$ ; Sputum '[ ]'$ ; Snoring '[ ]'$   GI: Vomiting'[ ]'$ ; Dysphagia'[ ]'$ ; Melena'[ ]'$ ; Hematochezia '[ ]'$ ; Heartburn'[ ]'$ ; Abdominal pain '[ ]'$ ; Constipation '[ ]'$ ; Diarrhea '[ ]'$ ; BRBPR '[ ]'$   GU: Hematuria'[ ]'$ ; Dysuria '[ ]'$ ; Nocturia'[ ]'$   Vascular: Pain in legs with walking '[ ]'$ ; Pain in feet with lying flat '[ ]'$ ; Non-healing sores '[ ]'$ ; Stroke [Y]; TIA '[ ]'$ ; Slurred speech '[ ]'$ ;  Neuro: Headaches'[ ]'$ ; Vertigo'[ ]'$ ; Seizures'[ ]'$ ; Paresthesias'[ ]'$ ;Blurred vision '[ ]'$ ; Diplopia '[ ]'$ ; Vision changes '[ ]'$   Ortho/Skin: Arthritis '[ ]'$ ; Joint pain '[ ]'$ ; Muscle pain '[ ]'$ ; Joint swelling '[ ]'$ ; Back Pain '[ ]'$ ; Rash '[ ]'$   Psych: Depression'[ ]'$ ; Anxiety'[ ]'$   Heme: Bleeding problems '[ ]'$ ; Clotting disorders '[ ]'$ ; Anemia '[ ]'$   Endocrine: Diabetes '[ ]'$ ; Thyroid dysfunction'[ ]'$    Past Medical History:  Diagnosis Date   Abdominal pain    ? chronic cholecystitis; admxn to [facility] 9/12 (CT, USN, HIDA done)   Atrial fibrillation ([place])    Cardiomyopathy    echo  9/12: mild LVH, EF 35-40%, mod MR (difficult to judge /post leaflet restricted), mod BAE, mod RVE, mod TR    Cardiomyopathy ([place])    a. echo 9/12: mild LVH, EF 35-40%, mod MR (difficult to judge /post leaflet restricted), mod BAE, mod RVE, mod TR. b. Echo 11/2011: mild LVH, EF 55-60%, s/p MV repair w/o sig MR/MS, mildly dilated RV/RA, PASP 7mHg. // AFL dx 10/2018 - Echo 10/2018: EF 30, diff HK, s/p MV repair with trivial MR, mean MV 7 but not c/w significant mitral stenosis, mild RAE, severe LAE, mod reduced RVSF    [place]id artery disease [place]    a. Duplex 06/2015: 50% RECA, 1-39% BICA.   CHF (congestive heart  failure) (HCC)    Clotting disorder (HCC)    Coronary artery disease    a. s/p CABG 10/2011 - LIMA-LAD, SVG-PDA, SVG-OM, SVG-diagonal.   Dyslipidemia    Essential hypertension    Heart murmur    Hemifacial spasm    History of Doppler ultrasound    a. [facility] [place] [place]: no hemodynamically significant ICA stenosis   Hyperlipidemia    Mitral regurgitation    a. s/p MV repair 10/2011 - on Coumadin for 3 months afterwards then d/c'd by surgery.   Recurrent upper respiratory infection (URI)    S/P CABG x 4 10/21/2011   S/P mitral valve repair 10/21/2011   [place]   a. age 56- in setting of cigs and OCPs. //  b. s/p acute L parietal CV[facility] >> [facility] [place] [place] [place]  [facility]   Tobacco abuse     Current Outpatient Medications  Medication Sig Dispense Refill   acetaminophen (TYLENOL) 500 MG tablet Take 500-1,000 mg by mouth every 6 (six) hours as needed for headache.     amiodarone (PACERONE) 200 MG tablet Take 200 mg by mouth daily.     apixaban (ELIQUIS) 5 MG TABS tablet Take 1 tablet (5 mg total) by mouth 2 (two) times daily. 180 tablet 3   atorvastatin (LIPITOR) 80 MG tablet Take 1 tablet (80 mg total) by mouth daily. 90 tablet 3   dapagliflozin propanediol (FARXIGA) 10 MG TABS tablet Take 1 tablet (10 mg total) by mouth daily. 30 tablet 0   diphenhydrAMINE (BENADRYL) 25 MG tablet Take 25 mg by mouth every 6 (six) hours as needed for allergies.     famotidine (PEPCID) 20 MG tablet Take 20 mg by mouth daily as needed for indigestion.      ferrous sulfate 325 (65 FE) MG tablet Take 1 tablet (325 mg total) by mouth daily with breakfast. 30 tablet 3   furosemide (LASIX) 40 MG tablet Take 1 tablet (40 mg total) by mouth 2 (two) times daily. 60 tablet 0   metoprolol succinate (TOPROL-XL) 100 MG 24 hr tablet Take 1 tablet (100 mg total) by mouth daily. Take with or immediately following a meal. 30 tablet 0    sacubitril-valsartan (ENTRESTO) 24-26 MG Take 1 tablet by mouth 2 (two) times daily. 60 tablet 0   Saline 0.2 % SOLN Place 1 spray into both nostrils daily as needed (for congestion).     spironolactone (ALDACTONE) 25 MG tablet Take 1 tablet (25 mg total) by mouth daily. 30 tablet 2   vitamin B-12 (CYANOCOBALAMIN) 1000 MCG tablet Take 1 tablet (1,000 mcg total) by mouth daily.     vitamin C (ASCORBIC ACID) 500 MG tablet Take 1,000  mg by mouth 2 (two) times daily.      Current Facility-Administered Medications  Medication Dose Route Frequency Provider Last Rate Last Admin   incobotulinumtoxinA (XEOMIN) 50 units injection 50 Units  50 Units Intramuscular Q90 days Marcial Pacas, MD   50 Units at 11/30/16 1546   incobotulinumtoxinA (XEOMIN) 50 units injection 50 Units  50 Units Intramuscular Q90 days Marcial Pacas, MD   50 Units at 07/25/17 0843   incobotulinumtoxinA (XEOMIN) 50 units injection 50 Units  50 Units Intramuscular Q90 days Marcial Pacas, MD   50 Units at 07/26/17 1217    Allergies  Allergen Reactions   Azithromycin Nausea Only   Latex Rash   Spinach Hives   Tape Other (See Comments)    PLEASE USE PAPER TAPE!! AFFECTS SKIN BADLY!!!!      Social History   Socioeconomic History   Marital status: Widowed    Spouse name: Not on file   Number of children: 2   Years of education: Not on file   Highest education level: Bachelor's degree (e.g., BA, AB, BS)  Occupational History   Occupation: Payroll/Paperwork    Comment: 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   Occupation: Disability    Comment: since march 2023  Tobacco Use   Smoking status: Former    Packs/day: 1.00    Years: 30.00    Pack years: 30.00    Types: Cigarettes    Quit date: 05/07/2011    Years since quitting: 10.6   Smokeless tobacco: Never  Vaping Use   Vaping Use: Never used  Substance and Sexual Activity   Alcohol use: Yes    Alcohol/week: 1.0 standard drink    Types: 1 Cans of beer per week    Comment: can of  beer 1 x week   Drug use: No   Sexual activity: Yes  Other Topics Concern   Not on file  Social History Narrative   Lives at home with husband and son.   Right-handed.   2 cups caffeine daily.   Social Determinants of Health   Financial Resource Strain: Not on file  Food Insecurity: No Food Insecurity   Worried About Charity fundraiser in the Last Year: Never true   Ran Out of Food in the Last Year: Never true  Transportation Needs: No Transportation Needs   Lack of Transportation (Medical): No   Lack of Transportation (Non-Medical): No  Physical Activity: Not on file  Stress: Not on file  Social Connections: Not on file  Intimate Partner Violence: Not on file      Family History  Problem Relation Age of Onset   Hypertension Mother    Stroke Mother    Heart disease Father    COPD Father    Bone cancer Maternal Grandmother    Anesthesia problems Neg Hx    Hypotension Neg Hx    Malignant hyperthermia Neg Hx    Pseudochol deficiency Neg Hx    Colon cancer Neg Hx     Vitals:   01/03/22 0910  BP: 130/78  Pulse: 64  SpO2: 98%  Weight: 82.6 kg    PHYSICAL EXAM: General:  No distress. Ambulated into clinic.  HEENT: normal Neck: supple. no JVD. Carotids 2+ bilat; no bruits. No lymphadenopathy or thryomegaly appreciated. Cor: PMI nondisplaced. Regular rate & rhythm. No rubs, gallops, 2/6 HSM. Lungs: clear Abdomen: soft, nontender, nondistended.  Extremities: no cyanosis, clubbing, rash, trace edema Neuro: alert & oriented x 3, cranial nerves grossly intact. moves all  4 extremities w/o difficulty. Affect pleasant.   ASSESSMENT & PLAN: Chronic systolic CHF: -Echo 40/9811: EF 55-60% -Echo 10/2018: EF 30%. In setting of AFL with RVR -Echo 02/2019: EF 60-65% -Echo 01/23: EF 50-55% -Recurrent atrial arrhythmias end of January 2023 -Echo 04/23 in setting of atrial tach with RVR: EF 30-35%, mildly D-shaped interventicular spetum suggesting degree of RV overload, RV  moderately reduced, RVSP 58 mmHg, severe LAE, s/p MV repair, posterior leaflet appears severely restricted, mean gradient across MV 9 mmHg with MVA 1.08cm2 by VTI suggesting moderate to severe MS, at least moderate MR, severe TR, dilated IVC with estimated RAP 15 mmHg -Etiology not certain. ? D/t progressive valvular disease +/- tachymediated in setting of atrial arrhythmias. EF has been variable over the years, has previously recovered with restoration of SR. Reviewed her most recent echo with Dr. Haroldine Laws. Appears to have severe MR and possibly severely mitral stenosis.  -Scheduled for TEE with Dr. Audie Box. See discussion below. -Has known hx CAD and prior CABG. If requires intervention for mitral valve, will eventually require LHC. -NYHA IIIa. Volume looks okay on exam but ReDS 37%. Taking 40 mg furosemide BID. Will have her take an extra 40 mg for 2 days. -Continue Metoprolol XL 100 mg daily -Continue Entresto 24/26 mg BID -Continue Farxiga 10 mg daily -Add spiro 12.5 mg daily -BMET, BNP, CBC today. BMET again in 1 week.  2. CAD: -S/p CABG (LIMA to LAD, SVG to PDA, SVG to OM, SVG to diagonal) in 2013 -No aspirin with anticoagulation -Continue statin -Denies angina  3. S/p mitral valve repair with severe MR/MS: -Most recent echo reviewed with Dr. Haroldine Laws. Has severe MR and possibly severe mitral stenosis. Posterior leaflet restricted.  -Suspect severe MR contributing to atrial arrhythmias -Arranged for TEE with Dr. Audie Box -Refer to Structural Heart Team to see if appropriate candidate for transcatheter MV replacement (appointment after TEE completed). Otherwise, may need to be considered for surgical mitral valve replacement.  4. Tricuspid regurgitation: -Severe on echo 04/23  4. Atrial arrhythmias: -Maintaining SR on ECG today -Continue amiodarone 200 mg daily per EP -Anticoagulated with Eliquis 5 mg BID. May need to consider warfarin for anticoagulation if TEE confirms significant  mitral stenosis  5. Tobacco use: -Quit smoking cold [place] 04/23  6. HTN:  -BP stable -GDMT as above  7. HLD:  -On high-intensity statin  8. Iron deficiency anemia: -Received feraheme during admit 04/23 -Hgb 10.1 in 04/23.  -No overt bleeding -CBC today  NYHA IIIa GDMT  Diuretic-lasxi 40 BID BB-metoprolol xl 100 mg daily Ace/ARB/ARNI-entresto 24/26 BID MRA-adding spiro 12.5 mg daily SGLT2i-entresto 24/26 mg BID    Referred to HFSW (PCP, Medications, Transportation, ETOH Abuse, Drug Abuse, Insurance, Financial ): No Refer to Pharmacy: No Refer to Home Health: No Refer to Advanced Heart Failure Clinic: Yes  Refer to General Cardiology: No, establishing with Dr. Johney Frame July 2023  Follow up: [facility] June 8th with APP/Dr. Haroldine Laws, Dr. Johney Frame as scheduled July 2023. Referred to Structural Heart, Dr. Burt Knack.  Marlyce Huge PA-C  Patient seen and examined with the above-signed Advanced Practice Provider and/or Housestaff. I personally reviewed laboratory data, imaging studies and relevant notes. I independently examined the patient and formulated the important aspects of the plan. I have edited the note to reflect any of my changes or salient points. I have personally discussed the plan with the patient and/or family.  56 y/o female smoker with h/o CAD and MR s/p CABG/MVR in 2013. Multiple admits  for ADHF and AF with RVR recently. Echo with EF down to 30-35% with significant MR and apparent MS. RV strain  Now in NSR on amio.   NYHA III  General:  Sitting in chair No resp difficulty HEENT: normal Neck: supple. Jvp 7. Carotids 2+ bilat; no bruits. No lymphadenopathy or thryomegaly appreciated. Cor: PMI nondisplaced. Regular rate & rhythm. 2/6 MR Lungs: decreased throughout Abdomen: soft, nontender, nondistended. No hepatosplenomegaly. No bruits or masses. Good bowel sounds. Extremities: no cyanosis, clubbing, rash, edema Neuro: alert &  orientedx3, cranial nerves grossly intact. moves all 4 extremities w/o difficulty. Affect pleasant  Suspect major issue is recurrent MR +/- MS. Suspect she may need MVR. Will schedule TEE with Dr. Audie Box. Will likely need cath as well. Continue amio. Volume status mildly elevated (ReDS 37%) in setting of MR.   Will follow closely in HF Clinic.   Glori Bickers, MD  10:21 AM

## 2022-01-03 ENCOUNTER — Encounter (HOSPITAL_COMMUNITY): Payer: Self-pay

## 2022-01-03 ENCOUNTER — Telehealth (HOSPITAL_COMMUNITY): Payer: Self-pay

## 2022-01-03 ENCOUNTER — Ambulatory Visit (HOSPITAL_COMMUNITY)
Admit: 2022-01-03 | Discharge: 2022-01-03 | Disposition: A | Discharge status: Home / Self Care | Payer: Medicaid Other | Attending: Physician Assistant | Admitting: Physician Assistant

## 2022-01-03 ENCOUNTER — Telehealth (HOSPITAL_COMMUNITY): Payer: Self-pay | Admitting: *Deleted

## 2022-01-03 VITALS — BP 130/78 | HR 64 | Wt 182.2 lb

## 2022-01-03 DIAGNOSIS — I498 Other specified cardiac arrhythmias: Secondary | ICD-10-CM | POA: Diagnosis not present

## 2022-01-03 DIAGNOSIS — Z952 Presence of prosthetic heart valve: Secondary | ICD-10-CM | POA: Diagnosis not present

## 2022-01-03 DIAGNOSIS — Z79899 Other long term (current) drug therapy: Secondary | ICD-10-CM | POA: Insufficient documentation

## 2022-01-03 DIAGNOSIS — I10 Essential (primary) hypertension: Secondary | ICD-10-CM

## 2022-01-03 DIAGNOSIS — I5022 Chronic systolic (congestive) heart failure: Secondary | ICD-10-CM | POA: Insufficient documentation

## 2022-01-03 DIAGNOSIS — E785 Hyperlipidemia, unspecified: Secondary | ICD-10-CM | POA: Diagnosis not present

## 2022-01-03 DIAGNOSIS — Z720 Tobacco use: Secondary | ICD-10-CM | POA: Diagnosis not present

## 2022-01-03 DIAGNOSIS — I110 Hypertensive heart disease with heart failure: Secondary | ICD-10-CM | POA: Insufficient documentation

## 2022-01-03 DIAGNOSIS — D508 Other iron deficiency anemias: Secondary | ICD-10-CM | POA: Diagnosis not present

## 2022-01-03 DIAGNOSIS — I081 Rheumatic disorders of both mitral and tricuspid valves: Secondary | ICD-10-CM | POA: Diagnosis not present

## 2022-01-03 DIAGNOSIS — D509 Iron deficiency anemia, unspecified: Secondary | ICD-10-CM | POA: Diagnosis not present

## 2022-01-03 DIAGNOSIS — Z8673 Personal history of transient ischemic attack (TIA), and cerebral infarction without residual deficits: Secondary | ICD-10-CM | POA: Insufficient documentation

## 2022-01-03 DIAGNOSIS — I340 Nonrheumatic mitral (valve) insufficiency: Secondary | ICD-10-CM

## 2022-01-03 DIAGNOSIS — Z87891 Personal history of nicotine dependence: Secondary | ICD-10-CM | POA: Diagnosis not present

## 2022-01-03 DIAGNOSIS — Z7901 Long term (current) use of anticoagulants: Secondary | ICD-10-CM | POA: Diagnosis not present

## 2022-01-03 DIAGNOSIS — Z951 Presence of aortocoronary bypass graft: Secondary | ICD-10-CM | POA: Insufficient documentation

## 2022-01-03 DIAGNOSIS — I2510 Atherosclerotic heart disease of native coronary artery without angina pectoris: Secondary | ICD-10-CM | POA: Diagnosis not present

## 2022-01-03 LAB — CBC
HCT: 41.6 % (ref 36.0–46.0)
Hemoglobin: 13.1 g/dL (ref 12.0–15.0)
MCH: 28.7 pg (ref 26.0–34.0)
MCHC: 31.5 g/dL (ref 30.0–36.0)
MCV: 91 fL (ref 80.0–100.0)
Platelets: 244 10*3/uL (ref 150–400)
RBC: 4.57 MIL/uL (ref 3.87–5.11)
RDW: 18 % — ABNORMAL HIGH (ref 11.5–15.5)
WBC: 6.7 10*3/uL (ref 4.0–10.5)
nRBC: 0 % (ref 0.0–0.2)

## 2022-01-03 LAB — BASIC METABOLIC PANEL
Anion gap: 10 (ref 5–15)
BUN: 20 mg/dL (ref 6–20)
CO2: 29 mmol/L (ref 22–32)
Calcium: 9.6 mg/dL (ref 8.9–10.3)
Chloride: 101 mmol/L (ref 98–111)
Creatinine, Ser: 1.14 mg/dL — ABNORMAL HIGH (ref 0.44–1.00)
GFR, Estimated: 56 mL/min — ABNORMAL LOW (ref >60)
Glucose, Bld: 123 mg/dL — ABNORMAL HIGH (ref 70–99)
Potassium: 3.8 mmol/L (ref 3.5–5.1)
Sodium: 140 mmol/L (ref 135–145)

## 2022-01-03 LAB — BRAIN NATRIURETIC PEPTIDE: B Natriuretic Peptide: 3437.2 pg/mL — ABNORMAL HIGH (ref 0.0–100.0)

## 2022-01-03 MED ORDER — SPIRONOLACTONE 25 MG PO TABS: 25.0000 mg | ORAL_TABLET | Freq: Every day | ORAL | 2 refills | Status: DC

## 2022-01-03 MED ORDER — DAPAGLIFLOZIN PROPANEDIOL 10 MG PO TABS: 10.0000 mg | ORAL_TABLET | Freq: Every day | ORAL | 0 refills | Status: DC

## 2022-01-03 MED ORDER — SPIRONOLACTONE 25 MG PO TABS: 12.5000 mg | ORAL_TABLET | Freq: Every day | ORAL | 2 refills | Status: DC

## 2022-01-03 MED ORDER — SACUBITRIL-VALSARTAN 24-26 MG PO TABS: 1.0000 | ORAL_TABLET | Freq: Two times a day (BID) | ORAL | 0 refills | Status: DC

## 2022-01-03 NOTE — Telephone Encounter (Signed)
Spoke to patient about Spironolactone dose being 12.5 mg daily. ?As per Marlyce Huge PA patients BNP  lab  value is elevated and PA wants follow up appointment sooner. Patient scheduled with TOC  for 01/10/22@ 12:00. Patient is aware and agreeable to  change. ?

## 2022-01-03 NOTE — Addendum Note (Signed)
Encounter addended by: Stanford Scotland, RN on: 01/03/2022 2:17 PM ? Actions taken: Pharmacy for encounter modified, Order list changed

## 2022-01-03 NOTE — Telephone Encounter (Signed)
Called to confirm Heart & Vascular Transitions of Care appointment at 9 am on 01/03/22. Patient reminded to bring all medications and pill box organizer with them. Confirmed patient has transportation. Gave directions, instructed to utilize [place] parking. ? ?Confirmed appointment prior to ending call.   ? ?Earnestine Leys, BSN, RN ?Heart Failure Nurse Navigator ?Secure Chat Only  ?

## 2022-01-03 NOTE — Patient Instructions (Signed)
Good to see you today! ? ?Take extra Lasix 40 mg x 2 days ? ?Start Spironolactone 12.5 mg(1/2 tablet) daily ? ?Labs done today, your results will be available in MyChart, we will contact you for abnormal readings. ? ? ?Follow up lab work in 10 days ? ?Your physician recommends that you schedule a follow-up appointment in: 1 week with app clinic ? ?Your physician recommends that you schedule a follow-up appointment in: 2 months with Dr. Haroldine Laws ? ? ? ?you are scheduled for a TEE/Cardioversion/TEE Cardioversion on may 31  with Dr. Viona Gilmore o'Neal.  Please arrive at the [facility] (Main Entrance A) at [facility]: [place], [place] [address] at 08oo am/pm. (1 hour prior to procedure unless lab work is needed; if lab work is needed arrive 1.5 hours ahead) ? ?DIET: Nothing to eat or drink after midnight except a sip of water with medications (see medication instructions below) ? ?Medication Instructions: ?Hold Lasix am  ? ?Continue your anticoagulant: Eliquis ?You will need to continue your anticoagulant after your procedure until you  are told by your  ?Provider that it is safe to stop ? ? ? ?You must have a responsible person to drive you home and stay in the waiting area during your procedure. Failure to do so could result in cancellation. ? ?Interior and spatial designer cards. ? ?*Special Note: Every effort is made to have your procedure done on time. Occasionally there are emergencies that occur at the hospital that may cause delays. Please be patient if a delay does occur.  ? ? ? ? ?If you have any questions or concerns before your next appointment please send [place] a message through [place] or call our office at [phone].   ? ?TO LEAVE A MESSAGE FOR THE NURSE SELECT OPTION 2, PLEASE LEAVE A MESSAGE INCLUDING: ?YOUR NAME ?DATE OF BIRTH ?CALL BACK NUMBER ?REASON FOR CALL**this is important as we prioritize the call backs ? ?YOU WILL RECEIVE A CALL BACK THE SAME DAY AS LONG AS YOU CALL BEFORE 4:00 PM ? ?At  the [facility], you and your health needs are our priority. As part of our continuing mission to provide you with exceptional heart care, we have created designated Provider Care Teams. These Care Teams include your primary Cardiologist (physician) and Advanced Practice Providers (APPs- Physician Assistants and Nurse Practitioners) who all work together to provide you with the care you need, when you need it.  ? ?You may see any of the following providers on your designated Care Team at your next follow up: ?Dr Glori Bickers ?Dr Loralie Champagne ?Darrick Grinder, NP ?Lyda Jester, PA ?Jessica Milford,NP ?Marlyce Huge, PA ?Audry Riles, PharmD ? ? ?Please be sure to bring in all your medications bottles to every appointment.  ? ? ?

## 2022-01-03 NOTE — Addendum Note (Signed)
Encounter addended by: Joette Catching, PA-C on: 01/03/2022 2:37 PM ? Actions taken: Clinical Note Signed

## 2022-01-03 NOTE — Progress Notes (Signed)
ReDS Vest / Clip - 01/03/22 1000   ? ?  ? ReDS Vest / Clip  ? Station Marker D   ? Ruler Value 29   ? ReDS Value Range Moderate volume overload   ? ReDS Actual Value 37   ? ?  ?  ? ?  ? ? ?

## 2022-01-03 NOTE — Progress Notes (Signed)
Heart and Vascular Center Transitions of Care Clinic ?Heart Failure Pharmacist Encounter ? ?PCP: [name], MD ?PCP-Cardiologist: Dorris Carnes, MD ? ?HPI:  ?56 yo F with CAD s/p CABG, MVP with severe MR (noted after MV repair), CHF, sinus node dysfunction s//p PPM, CVA, afib, HTN, HLD, and tobacco use. She presented to the ED on 4/28 with volume overload and atrial tachycardia. CXR on admission without active disease. Her last ECHO was done on 4/17 and LVEF was 30-35% (was 50-55% in Jan) with moderately reduced RV. Losartan was discontinued and she was started on Entresto + Farxiga. She was discharged on 5/3.   ? ?She had an outpatient visit on 5/9 and weight was down further from discharge. Edema was improved with only notable LE edema at night. BP at this visit was 140/80 HR 60.  ? ?Today, [place] presents to the [facility] with her daughter for follow up. Overall, she feels very well. Denies shortness of breath, DOE, orthopnea/PND, lightheadedness, dizziness, chest pain, or palpitations. She says she only has minimal edema in her feet at night. She checks her weight daily at home and notates that she has seen a consistent decline since she left the hospital. She checks her BP regularly at home and it stays about 130/80s. She has been following a low-sodium and fluid restricted diet at home and takes her medications as prescribed without concerns. ? ?ReDS today 37% ? ?HF Medications: ?Entresto 24/26 mg BID ?Metoprolol XL 100 mg daily ?Farxiga 10 mg daily ?Furosemide 40 mg BID ? ?Has the patient been experiencing any side effects to the medications prescribed?  no ? ?Does the patient have any problems obtaining medications due to transportation or finances?   No - has Managed Medicaid ? ?Understanding of regimen: good ?Understanding of indications: good ?Potential of compliance: excellent ?Patient understands to avoid NSAIDs. ?Patient understands to avoid decongestants. ?  ?Pertinent  Lab Values: ?Serum creatinine 1.14, BUN 20, Potassium 3.8, Sodium 140, BNP 3437.2  ? ?Vital Signs: ?Weight: 182 lbs (discharge weight: 191 lbs) ?Blood pressure: 130/78  ?Heart rate: 64  ?ReDS: 37% ? ?Medication Assistance / Insurance Benefits Check: ?Does the patient have prescription insurance?  Yes ?Type of insurance plan: Managed Medicaid ? ?Outpatient Pharmacy:  ?Current outpatient pharmacy: [facility] ?Was the [facility] pharmacy used to supply discharge medications? yes  ?If [facility] pharmacy was used, were the refills transferred out to current pharmacy yet? no - sending refills today ?Is the patient willing to transition their outpatient pharmacy to utilize a [facility] outpatient pharmacy with or without mail order?   No  [facility] not contracted with insurance ? ?Assessment: ?1) Chronic systolic CHF (EF 16-38%). NYHA class II symptoms. Slighlly volume up on exam today. ?- Continue Entresto 24/26 mg BID ?- Continue metoprolol XL 100 mg daily ?- Start spironolactone 12.5 mg daily - BMET in 1 week ?- Continue Farxiga 10 mg daily ?- Continue furosemide 40 mg BID ?- She has been educated on current HF medications and potential additions to HF medication regimen ?- She verbalizes understanding that over the next few months, these medication doses may change and more medications may be added to optimize HF regimen ?- She has been educated on basic disease state pathophysiology and goals of therapy ? ? ?Plan: ?1) Medication changes: ?- Start spironolactone 12.5 mg daily  ?- BMET in 1 week ? ?2) Patient Assistance: ?- None needed - has Managed Medicaid ? ?3) Follow up: ?- Next appointment with lab in  1 week and with APP clinic on 6/8 ? ?Kerby Nora, PharmD, BCPS ?Heart Failure Transitions of Care Clinic Pharmacist ?[phone] ? ? ?

## 2022-01-05 NOTE — Progress Notes (Signed)
Remote pacemaker transmission.   

## 2022-01-09 NOTE — Progress Notes (Addendum)
HEART & VASCULAR TRANSITION OF CARE CLINIC OTE     Referring Physician: Dr. Cathlean Sauer Primary Care: Dr. Margarita Rana Primary Cardiologist: Establishing with Dr. Johney Frame, HF Dr. Haroldine Laws  HPI: Referred to clinic by Dr. Cathlean Sauer with [facility] for heart failure consultation. 56 y.o. female with history of CAD s/p CABG in 2013, MVP with severe MR s/p MV repair at time of CABG, chronic systolic CHF, sinus node dysfunction s/p [employer] 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   Found to be in atrial flutter with RVR 10/2018. EF down to 30% on echo. Underwent DCCV 01/2019. She had recurrence of AF but EF had improved to 60-65% on subsequent echo 07/20.   Echo 01/23: EF 50-55%  Has been on amiodarone for atrial arrhythmias. Amiodarone increased 03/23 d/t atrial tachycardia.   Admitted 04/23 with AT with RVR and volume overload. Attempts to pace out were unsuccessful and she underwent DCCV. She was readmitted about 10 days later with recurrent AF and volume overload. Also treated with abx for possible CAP. She was placed on amiodarone drip and converted to SR. Diuresed with IV lasix. Transitioned to P.O. lasxi 40 mg BID at discharge. Echo: EF 30-35%, RV moderately reduced, RVSP 58 mmHg, severe LAE, posterior leaflet mitral valve appears severely restricted, moderate to severe mitral stenosis with mean gradient 9 mmHg and moderate MR ( may not be fully visualized, thick CW doppler jet), severe TR  Seen in TOC for initial visit 01/03/2022. Home weight 182 lb, down about 30 lb over period of a month. Volume appeared mildly elevated and lasix increased for 2 days. ReDS 37% and BNP 1,304>>3,437  Referred for TEE to better assess MR and TR.  Here today for close follow-up. She is accompanied by her daughter. Her home weight is down from 182 lb last week to 175 lb today. She is feeling well. No significant dyspnea, orthopnea, or PND. LE edema  improved. She went to visit family this weekend and walked quite a bit without difficulty. Watching fluid and sodium intake.  Has about 3 alcoholic beverages weekly. Quit smoking April 2023.   Taking all meds as prescribed. Lives with her daughter and son.     Review of Systems: Cardiac and Respiratory. Negative except as mentioned in HPI   Past Medical History:  Diagnosis Date   Abdominal pain    ? chronic cholecystitis; admxn to [facility] 9/12 (CT, USN, HIDA done)   Atrial fibrillation ([place])    Cardiomyopathy    echo 9/12: mild LVH, EF 35-40%, mod MR (difficult to judge /post leaflet restricted), mod BAE, mod RVE, mod TR    Cardiomyopathy ([place])    a. echo 9/12: mild LVH, EF 35-40%, mod MR (difficult to judge /post leaflet restricted), mod BAE, mod RVE, mod TR. b. Echo 11/2011: mild LVH, EF 55-60%, s/p MV repair w/o sig MR/MS, mildly dilated RV/RA, PASP 43mHg. // AFL dx 10/2018 - Echo 10/2018: EF 30, diff HK, s/p MV repair with trivial MR, mean MV 7 but not c/w significant mitral stenosis, mild RAE, severe LAE, mod reduced RVSF    Car[place]id artery disease [place]    a. Duplex 06/2015: 50% RECA, 1-39% BICA.   CHF (congestive heart failure) (HCC)    Clotting disorder (HCC)    Coronary artery disease    a. s/p CABG 10/2011 - LIMA-LAD, SVG-PDA, SVG-OM, SVG-diagonal.   Dyslipidemia    Essential hypertension    Heart murmur  Hemifacial spasm    History of Doppler ultrasound    a. Carotid [facility] in [place], [name]) 7/17: no hemodynamically significant ICA stenosis   Hyperlipidemia    Mitral regurgitation    a. s/p MV repair 10/2011 - on Coumadin for 3 months afterwards then d/c'd by surgery.   Recurrent upper respiratory infection (URI)    S/P CABG x 4 10/21/2011   S/P mitral valve repair 10/21/2011   Stroke ([place]) 1989   a. age 12 - in setting of cigs and OCPs. //  b. s/p acute L parietal CVA >> tPA - [facility] in [place], [name]   Stroke [facility]) 01/2017   Tobacco abuse      Current Outpatient Medications  Medication Sig Dispense Refill   acetaminophen (TYLENOL) 500 MG tablet Take 500-1,000 mg by mouth every 6 (six) hours as needed for headache.     amiodarone (PACERONE) 200 MG tablet Take 200 mg by mouth daily.     apixaban (ELIQUIS) 5 MG TABS tablet Take 1 tablet (5 mg total) by mouth 2 (two) times daily. 180 tablet 3   atorvastatin (LIPITOR) 80 MG tablet Take 1 tablet (80 mg total) by mouth daily. 90 tablet 3   dapagliflozin propanediol (FARXIGA) 10 MG TABS tablet Take 1 tablet (10 mg total) by mouth daily. 30 tablet 0   diphenhydrAMINE (BENADRYL) 25 MG tablet Take 25 mg by mouth every 6 (six) hours as needed for allergies.     famotidine (PEPCID) 20 MG tablet Take 20 mg by mouth daily as needed for indigestion.      ferrous sulfate 325 (65 FE) MG tablet Take 1 tablet (325 mg total) by mouth daily with breakfast. 30 tablet 3   metoprolol succinate (TOPROL-XL) 100 MG 24 hr tablet Take 1 tablet (100 mg total) by mouth daily. Take with or immediately following a meal. 30 tablet 0   sacubitril-valsartan (ENTRESTO) 24-26 MG Take 1 tablet by mouth 2 (two) times daily. 60 tablet 0   Saline 0.2 % SOLN Place 1 spray into both nostrils daily as needed (for congestion).     spironolactone (ALDACTONE) 25 MG tablet Take 0.5 tablets (12.5 mg total) by mouth daily. 15 tablet 2   vitamin B-12 (CYANOCOBALAMIN) 1000 MCG tablet Take 1 tablet (1,000 mcg total) by mouth daily.     vitamin C (ASCORBIC ACID) 500 MG tablet Take 1,000 mg by mouth 2 (two) times daily.      furosemide (LASIX) 40 MG tablet Take 1 tablet (40 mg total) by mouth daily. Take extra tablet in PM every other day. 30 tablet 0   Current Facility-Administered Medications  Medication Dose Route Frequency Provider Last Rate Last Admin   incobotulinumtoxinA (XEOMIN) 50 units injection 50 Units  50 Units Intramuscular Q90 days Marcial Pacas, MD   50 Units at 11/30/16 1546   incobotulinumtoxinA (XEOMIN) 50 units  injection 50 Units  50 Units Intramuscular Q90 days Marcial Pacas, MD   50 Units at 07/25/17 0843   incobotulinumtoxinA (XEOMIN) 50 units injection 50 Units  50 Units Intramuscular Q90 days Marcial Pacas, MD   50 Units at 07/26/17 1217    Allergies  Allergen Reactions   Azithromycin Nausea Only   Latex Rash   Spinach Hives   Tape Other (See Comments)    PLEASE USE PAPER TAPE!! AFFECTS SKIN BADLY!!!!      Social History   Socioeconomic History   Marital status: Widowed    Spouse name: Not  on file   Number of children: 2   Years of education: Not on file   Highest education level: Bachelor's degree (e.g., BA, AB, BS)  Occupational History   Occupation: Payroll/Paperwork    Comment: 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   Occupation: Disability    Comment: since march 2023  Tobacco Use   Smoking status: Former    Packs/day: 1.00    Years: 30.00    Pack years: 30.00    Types: Cigarettes    Quit date: 05/07/2011    Years since quitting: 10.6   Smokeless tobacco: Never  Vaping Use   Vaping Use: Never used  Substance and Sexual Activity   Alcohol use: Yes    Alcohol/week: 1.0 standard drink    Types: 1 Cans of beer per week    Comment: can of beer 1 x week   Drug use: No   Sexual activity: Yes  Other Topics Concern   Not on file  Social History Narrative   Lives at home with husband and son.   Right-handed.   2 cups caffeine daily.   Social Determinants of Health   Financial Resource Strain: Not on file  Food Insecurity: No Food Insecurity   Worried About Charity fundraiser in the Last Year: Never true   Ran Out of Food in the Last Year: Never true  Transportation Needs: No Transportation Needs   Lack of Transportation (Medical): No   Lack of Transportation (Non-Medical): No  Physical Activity: Not on file  Stress: Not on file  Social Connections: Not on file  Intimate Partner Violence: Not on file      Family History  Problem Relation Age of Onset    Hypertension Mother    Stroke Mother    Heart disease Father    COPD Father    Bone cancer Maternal Grandmother    Anesthesia problems Neg Hx    Hypotension Neg Hx    Malignant hyperthermia Neg Hx    Pseudochol deficiency Neg Hx    Colon cancer Neg Hx     Vitals:   01/10/22 1205  BP: 136/66  Pulse: 60  SpO2: 95%  Weight: 79.6 kg (175 lb 8 oz)     PHYSICAL EXAM: General:  No distress. Ambulated into clinic.  HEENT: normal Neck: supple. no JVD. Carotids 2+ bilat; no bruits. No lymphadenopathy or thryomegaly appreciated. Cor: PMI nondisplaced. Regular rate & rhythm. No rubs, gallops, 2/6 MR murmur Lungs: diminished Abdomen: soft, nontender, nondistended.  Extremities: no cyanosis, clubbing, rash, edema Neuro: alert & orientedx3, cranial nerves grossly intact. moves all 4 extremities w/o difficulty. Affect pleasant    ASSESSMENT & PLAN: Chronic systolic CHF: -Echo 45/8099: EF 55-60% -Echo 10/2018: EF 30%. In setting of AFL with RVR -Echo 02/2019: EF 60-65% -Echo 01/23: EF 50-55% -Recurrent atrial arrhythmias end of January 2023 -Echo 04/23 in setting of atrial tach with RVR: EF 30-35%, mildly D-shaped interventicular spetum suggesting degree of RV overload, RV moderately reduced, RVSP 58 mmHg, severe LAE, s/p MV repair, posterior leaflet appears severely restricted, mean gradient across MV 9 mmHg with MVA 1.08cm2 by VTI suggesting moderate to severe MS, at least moderate MR, severe TR, dilated IVC with estimated RAP 15 mmHg -Etiology not certain. ? D/t progressive valvular disease +/- tachymediated in setting of atrial arrhythmias. EF has been variable over the years, has previously recovered with restoration of SR. Reviewed her most recent echo with Dr. Haroldine Laws. Appears to have severe MR and possibly severely mitral  stenosis.  -Scheduled for TEE with Dr. Audie Box. See discussion below. -Has known hx CAD and prior CABG. If requires intervention for mitral valve, will eventually  require LHC. -NYHA IIIa. Volume looks good. ReDS only 21%. Decrease furosemide to 40 mg daily alternating with 40 mg every other day -Continue Metoprolol XL 100 mg daily -Continue Entresto 24/26 mg BID -Continue Farxiga 10 mg daily -Continue spiro 12.5 mg daily -BMET and BNP today  2. CAD: -S/p CABG (LIMA to LAD, SVG to PDA, SVG to OM, SVG to diagonal) in 2013 -No aspirin with anticoagulation -Continue statin -Denies angina  3. S/p mitral valve repair with severe MR/MS: -Most recent echo reviewed with Dr. Haroldine Laws. Has severe MR and possibly severe mitral stenosis. Posterior leaflet restricted.  -Suspect severe MR contributing to atrial arrhythmias -Has TEE with Dr. Audie Box 05/31 -Refertrf to Structural Heart Team to see if appropriate candidate for transcatheter MV replacement (appointment after TEE completed). Otherwise, may need to be considered for surgical mitral valve replacement.  4. Tricuspid regurgitation: -Severe on echo 04/23  4. Atrial arrhythmias: -SR on ECG last visit 05/15. Regular rhythm on exam today -Continue amiodarone 200 mg daily per EP -Anticoagulated with Eliquis 5 mg BID. May need to consider warfarin for anticoagulation if TEE confirms significant mitral stenosis  5. Tobacco use: -Quit smoking cold [place] 04/23  6. HTN:  -BP stable -GDMT as above  7. HLD:  -On high-intensity statin  8. Iron deficiency anemia: -Received feraheme during admit 04/23 -Hgb stable at 13 in 05/23 -No overt bleeding  9. Pulmonary nodules: -Noted on CT scan 04/23. Nodularity up to 2 cm right lung apex and areas of ground-glass nodularity RUL. Findings possibly infectious or inflammatory. Neoplasm not exculded. -Seeing Pulmonary today   NYHA IIIa GDMT  Diuretic-lasix 40 daily alternating with 40 mg BID every other day BB-metoprolol xl 100 mg daily Ace/ARB/ARNI-entresto 24/26 BID MRA-adding spiro 12.5 mg daily SGLT2i-entresto 24/26 mg BID    Referred to HFSW  (PCP, Medications, Transportation, ETOH Abuse, Drug Abuse, Insurance, Financial ): No Refer to Pharmacy: Yes Refer to Home Health: No Refer to Advanced Heart Failure Clinic: Yes  Refer to General Cardiology: No, establishing with Dr. Johney Frame July 2023  Follow up: [facility] June 8th with APP/Dr. Haroldine Laws, Dr. Johney Frame as scheduled July 2023. Referred to Structural Heart, Dr. Burt Knack.  Marlyce Huge PA-C

## 2022-01-09 NOTE — Addendum Note (Signed)
Encounter addended by: Jolaine Artist, MD on: 01/09/2022 10:22 AM  Actions taken: Clinical Note Signed, Level of Service modified

## 2022-01-10 ENCOUNTER — Encounter (HOSPITAL_COMMUNITY): Payer: Self-pay | Admitting: Cardiovascular Disease

## 2022-01-10 ENCOUNTER — Other Ambulatory Visit (HOSPITAL_COMMUNITY): Payer: Disability Insurance

## 2022-01-10 ENCOUNTER — Encounter (HOSPITAL_COMMUNITY): Payer: Self-pay

## 2022-01-10 ENCOUNTER — Telehealth (HOSPITAL_COMMUNITY): Payer: Self-pay | Admitting: *Deleted

## 2022-01-10 ENCOUNTER — Ambulatory Visit (HOSPITAL_COMMUNITY)
Admission: RE | Admit: 2022-01-10 | Discharge: 2022-01-10 | Disposition: A | Discharge status: Home / Self Care | Payer: Medicaid Other | Source: Ambulatory Visit | Attending: Physician Assistant | Admitting: Physician Assistant

## 2022-01-10 ENCOUNTER — Ambulatory Visit (INDEPENDENT_AMBULATORY_CARE_PROVIDER_SITE_OTHER): Payer: Medicaid Other | Admitting: Pulmonary Disease

## 2022-01-10 ENCOUNTER — Encounter: Payer: Self-pay | Admitting: Pulmonary Disease

## 2022-01-10 VITALS — BP 136/66 | HR 60 | Wt 175.5 lb

## 2022-01-10 VITALS — BP 128/62 | HR 60 | Temp 98.9°F | Ht 68.0 in | Wt 175.8 lb

## 2022-01-10 DIAGNOSIS — R918 Other nonspecific abnormal finding of lung field: Secondary | ICD-10-CM

## 2022-01-10 DIAGNOSIS — I110 Hypertensive heart disease with heart failure: Secondary | ICD-10-CM | POA: Insufficient documentation

## 2022-01-10 DIAGNOSIS — I2510 Atherosclerotic heart disease of native coronary artery without angina pectoris: Secondary | ICD-10-CM | POA: Diagnosis not present

## 2022-01-10 DIAGNOSIS — I340 Nonrheumatic mitral (valve) insufficiency: Secondary | ICD-10-CM | POA: Diagnosis not present

## 2022-01-10 DIAGNOSIS — I5022 Chronic systolic (congestive) heart failure: Secondary | ICD-10-CM | POA: Insufficient documentation

## 2022-01-10 DIAGNOSIS — Z87891 Personal history of nicotine dependence: Secondary | ICD-10-CM

## 2022-01-10 DIAGNOSIS — Z8673 Personal history of transient ischemic attack (TIA), and cerebral infarction without residual deficits: Secondary | ICD-10-CM | POA: Insufficient documentation

## 2022-01-10 DIAGNOSIS — Z951 Presence of aortocoronary bypass graft: Secondary | ICD-10-CM | POA: Insufficient documentation

## 2022-01-10 DIAGNOSIS — I361 Nonrheumatic tricuspid (valve) insufficiency: Secondary | ICD-10-CM | POA: Diagnosis not present

## 2022-01-10 DIAGNOSIS — I081 Rheumatic disorders of both mitral and tricuspid valves: Secondary | ICD-10-CM | POA: Insufficient documentation

## 2022-01-10 DIAGNOSIS — I5043 Acute on chronic combined systolic (congestive) and diastolic (congestive) heart failure: Secondary | ICD-10-CM | POA: Diagnosis not present

## 2022-01-10 DIAGNOSIS — D509 Iron deficiency anemia, unspecified: Secondary | ICD-10-CM | POA: Diagnosis not present

## 2022-01-10 DIAGNOSIS — E785 Hyperlipidemia, unspecified: Secondary | ICD-10-CM | POA: Insufficient documentation

## 2022-01-10 DIAGNOSIS — Z79899 Other long term (current) drug therapy: Secondary | ICD-10-CM | POA: Diagnosis not present

## 2022-01-10 DIAGNOSIS — Z7901 Long term (current) use of anticoagulants: Secondary | ICD-10-CM | POA: Diagnosis not present

## 2022-01-10 DIAGNOSIS — R911 Solitary pulmonary nodule: Secondary | ICD-10-CM | POA: Diagnosis not present

## 2022-01-10 DIAGNOSIS — I10 Essential (primary) hypertension: Secondary | ICD-10-CM

## 2022-01-10 LAB — BASIC METABOLIC PANEL
Anion gap: 10 (ref 5–15)
BUN: 35 mg/dL — ABNORMAL HIGH (ref 6–20)
CO2: 25 mmol/L (ref 22–32)
Calcium: 10.1 mg/dL (ref 8.9–10.3)
Chloride: 103 mmol/L (ref 98–111)
Creatinine, Ser: 1.21 mg/dL — ABNORMAL HIGH (ref 0.44–1.00)
GFR, Estimated: 52 mL/min — ABNORMAL LOW (ref >60)
Glucose, Bld: 123 mg/dL — ABNORMAL HIGH (ref 70–99)
Potassium: 4.3 mmol/L (ref 3.5–5.1)
Sodium: 138 mmol/L (ref 135–145)

## 2022-01-10 LAB — BRAIN NATRIURETIC PEPTIDE: B Natriuretic Peptide: 1518.9 pg/mL — ABNORMAL HIGH (ref 0.0–100.0)

## 2022-01-10 MED ORDER — FUROSEMIDE 40 MG PO TABS: 40.0000 mg | ORAL_TABLET | Freq: Every day | ORAL | 0 refills | Status: DC

## 2022-01-10 NOTE — Patient Instructions (Signed)
Medication Changes:  CHANGE lasix. Take extra tablet in PM every other day.   Lab Work:  Labs done today, your results will be available in MyChart, we will contact you for abnormal readings.  Special Instructions // Education:  Do the following things EVERYDAY: Weigh yourself in the morning before breakfast. Write it down and keep it in a log. Take your medicines as prescribed Eat low salt foods--Limit salt (sodium) to 2000 mg per day.  Stay as active as you can everyday Limit all fluids for the day to less than 2 liters  Follow-Up in: keep as scheduled  At the [facility], you and your health needs are our priority. We have a designated team specialized in the treatment of Heart Failure. This Care Team includes your primary Heart Failure Specialized Cardiologist (physician), Advanced Practice Providers (APPs- Physician Assistants and Nurse Practitioners), and Pharmacist who all work together to provide you with the care you need, when you need it.   You may see any of the following providers on your designated Care Team at your next follow up:  Dr Glori Bickers Dr Haynes Kerns, NP Lyda Jester, [place] [facility] [place], [place] Audry Riles, PharmD   Please be sure to bring in all your medications bottles to every appointment.   Need to Contact us:  If you have any questions or concerns before your next appointment please send [place] a message through [place] or call our office at [phone].    TO LEAVE A MESSAGE FOR THE NURSE SELECT OPTION 2, PLEASE LEAVE A MESSAGE INCLUDING: YOUR NAME DATE OF BIRTH CALL BACK NUMBER REASON FOR CALL**this is important as we prioritize the call backs  YOU WILL RECEIVE A CALL BACK THE SAME DAY AS LONG AS YOU CALL BEFORE 4:00 PM

## 2022-01-10 NOTE — Patient Instructions (Signed)
Thank you for visiting Dr. Valeta Harms at [facility]. Today we recommend the following:  Orders Placed This Encounter  Procedures   CT Super D Chest Wo Contrast   Follow up with [place] after ct chest.   Return in about 3 months (around 04/12/2022) for with Eric Form, NP.    Please do your part to reduce the spread of COVID-19.

## 2022-01-10 NOTE — Progress Notes (Signed)
Synopsis: Referred in May 2023 for lung nodule by [name], MD  Subjective:   PATIENT ID: Ashley [name] GENDER: female DOB: 1966-07-23, MRN: [id number]  Chief Complaint  Patient presents with   Consult    Consult.     This is a 56 year old female, past medical history of cardiomyopathy, chronic systolic heart failure, mitral valve disease, history of repair.  Plan TEE in few weeks with cardiology.  Has coronary artery disease status post CABG in 2013.  Longstanding history of tobacco use.  Here today for evaluation after having a CT scan of the chest which showed a new 20 mm right upper lobe apical groundglass opacity.  She recently quit smoking in April 2023.  She smoked for 30+ years   Past Medical History:  Diagnosis Date   Abdominal pain    ? chronic cholecystitis; admxn to [facility] 9/12 (CT, USN, HIDA done)   Atrial fibrillation ([place])    Cardiomyopathy    echo 9/12: mild LVH, EF 35-40%, mod MR (difficult to judge /post leaflet restricted), mod BAE, mod RVE, mod TR    Cardiomyopathy ([place])    a. echo 9/12: mild LVH, EF 35-40%, mod MR (difficult to judge /post leaflet restricted), mod BAE, mod RVE, mod TR. b. Echo 11/2011: mild LVH, EF 55-60%, s/p MV repair w/o sig MR/MS, mildly dilated RV/RA, PASP 15mHg. // AFL dx 10/2018 - Echo 10/2018: EF 30, diff HK, s/p MV repair with trivial MR, mean MV 7 but not c/w significant mitral stenosis, mild RAE, severe LAE, mod reduced RVSF    Carotid artery disease ([facility]    a. Duplex 06/2015: 50% RECA, 1-39% BICA.   CHF (congestive heart failure) (HCC)    Clotting disorder (HCC)    Coronary artery disease    a. s/p CABG 10/2011 - LIMA-LAD, SVG-PDA, SVG-OM, SVG-diagonal.   Dyslipidemia    Essential hypertension    Heart murmur    Hemifacial spasm    History of Doppler ultrasound    a. [facility] [place] [facility]: no hemodynamically significant ICA stenosis   Hyperlipidemia    Mitral regurgitation    a. s/p  MV repair 10/2011 - on Coumadin for 3 months afterwards then d/c'd by surgery.   Recurrent upper respiratory infection (URI)    S/P CABG x 4 10/21/2011   S/P mitral valve repair 10/21/2011   [place] 1989   a. age 56- in setting of cigs and OCPs. //  b. s/p acute L parietal CV[facility] >> [facility] [place]  [facility]roke [facility] 01/2017   Tobacco abuse      Family History  Problem Relation Age of Onset   Hypertension Mother    Stroke Mother    Heart disease Father    COPD Father    Bone cancer Maternal Grandmother    Anesthesia problems Neg Hx    Hypotension Neg Hx    Malignant hyperthermia Neg Hx    Pseudochol deficiency Neg Hx    Colon cancer Neg Hx      Past Surgical History:  Procedure Laterality Date   CARDIAC CATHETERIZATION     CARDIOVERSION N/A 01/25/2019   Procedure: CARDIOVERSION;  Surgeon: HPixie Casino MD;  [facility]  Service: Cardiovascular;  Laterality: N/A;   CARDIOVERSION N/A 06/14/2019   Procedure: CARDIOVERSION;  Surgeon: HPixie Casino MD;  [place] [facility] [place]  Service: Cardiovascular;  Laterality: N/A;   CESAREAN SECTION  1996   twins   CORONARY ARTERY BYPASS GRAFT  10/21/2011   Procedure: CORONARY ARTERY BYPASS GRAFTING (CABG);  Surgeon: [name], MD;  Location: [place];  Service: Open Heart Surgery;  Laterality: N/A;  cabg x four, using right leg greater saphenous vein harvested endoscopically   DENTAL SURGERY     MITRAL VALVE REPAIR  10/21/2011   Procedure: MITRAL VALVE REPAIR (MVR);  Surgeon: [name], MD;  Location: [place];  Service: Open Heart Surgery;  Laterality: N/A;   PACEMAKER IMPLANT N/A 06/25/2019   Procedure: PACEMAKER IMPLANT;  Surgeon: [name], MD;  Location: [place] CV LAB;  Service: Cardiovascular;  Laterality: N/A;   TEE WITHOUT CARDIOVERSION  06/30/2011   Procedure: TRANSESOPHAGEAL ECHOCARDIOGRAM (TEE);  Surgeon: [name], MD;  [facility];  Service:  Cardiovascular;  Laterality: N/A;   TONSILLECTOMY     TUBAL LIGATION      Social History   Socioeconomic History   Marital status: Widowed    Spouse name: Not on file   Number of children: 2   Years of education: Not on file   Highest education level: Bachelor's degree (e.g., BA, AB, BS)  Occupational History   Occupation: Payroll/Paperwork    Comment: 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   Occupation: Disability    Comment: since march 2023  Tobacco Use   Smoking status: Former    Packs/day: 1.00    Years: 30.00    Pack years: 30.00    Types: Cigarettes    Quit date: 05/07/2011    Years since quitting: 10.6   Smokeless tobacco: Never  Vaping Use   Vaping Use: Never used  Substance and Sexual Activity   Alcohol use: Yes    Alcohol/week: 1.0 standard drink    Types: 1 Cans of beer per week    Comment: can of beer 1 x week   Drug use: No   Sexual activity: Yes  Other Topics Concern   Not on file  Social History Narrative   Lives at home with husband and son.   Right-handed.   2 cups caffeine daily.   Social Determinants of Health   Financial Resource Strain: Not on file  Food Insecurity: No Food Insecurity   Worried About Charity fundraiser in the Last Year: Never true   Ran Out of Food in the Last Year: Never true  Transportation Needs: No Transportation Needs   Lack of Transportation (Medical): No   Lack of Transportation (Non-Medical): No  Physical Activity: Not on file  Stress: Not on file  Social Connections: Not on file  Intimate Partner Violence: Not on file     Allergies  Allergen Reactions   Azithromycin Nausea Only   Latex Rash   Spinach Hives   Tape Other (See Comments)    PLEASE USE PAPER TAPE!! AFFECTS SKIN BADLY!!!!     Outpatient Medications Prior to Visit  Medication Sig Dispense Refill   acetaminophen (TYLENOL) 500 MG tablet Take 500-1,000 mg by mouth every 6 (six) hours as needed for headache.     amiodarone (PACERONE) 200 MG tablet  Take 200 mg by mouth daily.     apixaban (ELIQUIS) 5 MG TABS tablet Take 1 tablet (5 mg total) by mouth 2 (two) times daily. 180 tablet 3   atorvastatin (LIPITOR) 80 MG tablet Take 1 tablet (80 mg total) by mouth daily. 90 tablet 3   dapagliflozin propanediol (FARXIGA) 10 MG TABS tablet Take 1 tablet (10 mg  total) by mouth daily. 30 tablet 0   diphenhydrAMINE (BENADRYL) 25 MG tablet Take 25 mg by mouth every 6 (six) hours as needed for allergies.     famotidine (PEPCID) 20 MG tablet Take 20 mg by mouth daily as needed for indigestion.      ferrous sulfate 325 (65 FE) MG tablet Take 1 tablet (325 mg total) by mouth daily with breakfast. 30 tablet 3   furosemide (LASIX) 40 MG tablet Take 1 tablet (40 mg total) by mouth daily. Take extra tablet in PM every other day. 30 tablet 0   metoprolol succinate (TOPROL-XL) 100 MG 24 hr tablet Take 1 tablet (100 mg total) by mouth daily. Take with or immediately following a meal. 30 tablet 0   sacubitril-valsartan (ENTRESTO) 24-26 MG Take 1 tablet by mouth 2 (two) times daily. 60 tablet 0   Saline 0.2 % SOLN Place 1 spray into both nostrils daily as needed (for congestion).     spironolactone (ALDACTONE) 25 MG tablet Take 0.5 tablets (12.5 mg total) by mouth daily. 15 tablet 2   vitamin B-12 (CYANOCOBALAMIN) 1000 MCG tablet Take 1 tablet (1,000 mcg total) by mouth daily.     vitamin C (ASCORBIC ACID) 500 MG tablet Take 1,000 mg by mouth 2 (two) times daily.      Facility-Administered Medications Prior to Visit  Medication Dose Route Frequency Provider Last Rate Last Admin   incobotulinumtoxinA (XEOMIN) 50 units injection 50 Units  50 Units Intramuscular Q90 days Marcial Pacas, MD   50 Units at 11/30/16 1546   incobotulinumtoxinA (XEOMIN) 50 units injection 50 Units  50 Units Intramuscular Q90 days Marcial Pacas, MD   50 Units at 07/25/17 0843   incobotulinumtoxinA (XEOMIN) 50 units injection 50 Units  50 Units Intramuscular Q90 days Marcial Pacas, MD   50 Units at  07/26/17 1217    Review of Systems  Constitutional:  Negative for chills, fever, malaise/fatigue and weight loss.  HENT:  Negative for hearing loss, sore throat and tinnitus.   Eyes:  Negative for blurred vision and double vision.  Respiratory:  Positive for cough and shortness of breath. Negative for hemoptysis, sputum production, wheezing and stridor.   Cardiovascular:  Negative for chest pain, palpitations, orthopnea, leg swelling and PND.  Gastrointestinal:  Negative for abdominal pain, constipation, diarrhea, heartburn, nausea and vomiting.  Genitourinary:  Negative for dysuria, hematuria and urgency.  Musculoskeletal:  Negative for joint pain and myalgias.  Skin:  Negative for itching and rash.  Neurological:  Negative for dizziness, tingling, weakness and headaches.  Endo/Heme/Allergies:  Negative for environmental allergies. Does not bruise/bleed easily.  Psychiatric/Behavioral:  Negative for depression. The patient is not nervous/anxious and does not have insomnia.   All other systems reviewed and are negative.   Objective:  Physical Exam Vitals reviewed.  Constitutional:      General: She is not in acute distress.    Appearance: She is well-developed.  HENT:     Head: Normocephalic and atraumatic.  Eyes:     General: No scleral icterus.    Conjunctiva/sclera: Conjunctivae normal.     Pupils: Pupils are equal, round, and reactive to light.  Neck:     Vascular: No JVD.     Trachea: No tracheal deviation.  Cardiovascular:     Rate and Rhythm: Normal rate and regular rhythm.     Heart sounds: Normal heart sounds. No murmur heard.    Comments: Distant heart tones Pulmonary:     Effort: Pulmonary effort is  normal. No tachypnea, accessory muscle usage or respiratory distress.     Breath sounds: No stridor. No wheezing, rhonchi or rales.  Musculoskeletal:        General: No tenderness.     Cervical back: Neck supple.  Lymphadenopathy:     Cervical: No cervical  adenopathy.  Skin:    General: Skin is warm and dry.     Capillary Refill: Capillary refill takes less than 2 seconds.     Findings: No rash.  Neurological:     Mental Status: She is alert and oriented to person, place, and time.  Psychiatric:        Behavior: Behavior normal.     Vitals:   01/10/22 1459  BP: 128/62  Pulse: 60  Temp: 98.9 F (37.2 C)  TempSrc: Oral  SpO2: 95%  Weight: 175 lb 12.8 oz (79.7 kg)  Height: '5\' 8"'$  (1.727 m)   95% on RA BMI Readings from Last 3 Encounters:  01/10/22 26.73 kg/m  01/10/22 26.68 kg/m  01/03/22 27.70 kg/m   Wt Readings from Last 3 Encounters:  01/10/22 175 lb 12.8 oz (79.7 kg)  01/10/22 175 lb 8 oz (79.6 kg)  01/03/22 182 lb 3.2 oz (82.6 kg)     CBC    Component Value Date/Time   WBC 6.7 01/03/2022 1003   RBC 4.57 01/03/2022 1003   HGB 13.1 01/03/2022 1003   HGB 11.3 11/01/2021 1654   HCT 41.6 01/03/2022 1003   HCT 36.9 11/01/2021 1654   PLT 244 01/03/2022 1003   PLT 270 11/01/2021 1654   MCV 91.0 01/03/2022 1003   MCV 93 11/01/2021 1654   MCH 28.7 01/03/2022 1003   MCHC 31.5 01/03/2022 1003   RDW 18.0 (H) 01/03/2022 1003   RDW 13.5 11/01/2021 1654   LYMPHSABS 0.7 12/17/2021 1313   MONOABS 0.6 12/17/2021 1313   EOSABS 0.1 12/17/2021 1313   BASOSABS 0.0 12/17/2021 1313    Chest Imaging: 12/17/2021 CTA chest: Right upper lobe apical groundglass opacity.  Some enlarged mediastinal and hilar nodes.  Likely reactive. Groundglass lesion new from previous imaging. The patient's images have been independently reviewed by me.    Pulmonary Functions Testing Results:    Latest Ref Rng & Units 08/01/2019    3:47 PM  PFT Results  FVC-Pre L 2.65    FVC-Predicted Pre % 67    FVC-Post L 3.02    FVC-Predicted Post % 76    Pre FEV1/FVC % % 74    Post FEV1/FCV % % 72    FEV1-Pre L 1.95    FEV1-Predicted Pre % 62    FEV1-Post L 2.18    DLCO uncorrected ml/min/mmHg 18.60    DLCO UNC% % 78    DLVA Predicted % 114     TLC L 5.48    TLC % Predicted % 96    RV % Predicted % 155      FeNO:   Pathology:   Echocardiogram:   Heart Catheterization:     Assessment & Plan:     ICD-10-CM   1. Lung nodule  R91.1 CT Super D Chest Wo Contrast    2. Ground glass opacity present on imaging of lung  R91.8     3. Former smoker  Z87.891     4. Acute on chronic combined systolic and diastolic CHF (congestive heart failure) ([place])  I50.43       Discussion:  This is a 56 year old, former smoker quit in April 2023, found to have  a right upper lobe 20 mm groundglass lesion.  This was found incidentally on a CT chest.  She was treated with antibiotics during recent hospitalization.  Plan: She needs repeat CT imaging in August 2023. If the groundglass lesion is persistent I do think she is high risk for the development of malignancy. If this lesion grows in size or stays persistent then we can consider tissue sampling. She is not a surgical candidate due to her history of significant cardiomyopathy. We will need to observe this over time. Even if the lesion is still the same size we may consider watching it before making a decision. She may be high risk for general anesthesia until she gets her mitral valve repaired. Repeat noncontrasted CT scan of the chest in August and a appointment to see me or Eric Form, NP.    Garner Nash, DO [facility] 01/10/2022 3:33 PM      Current Outpatient Medications:    acetaminophen (TYLENOL) 500 MG tablet, Take 500-1,000 mg by mouth every 6 (six) hours as needed for headache., Disp: , Rfl:    amiodarone (PACERONE) 200 MG tablet, Take 200 mg by mouth daily., Disp: , Rfl:    apixaban (ELIQUIS) 5 MG TABS tablet, Take 1 tablet (5 mg total) by mouth 2 (two) times daily., Disp: 180 tablet, Rfl: 3   atorvastatin (LIPITOR) 80 MG tablet, Take 1 tablet (80 mg total) by mouth daily., Disp: 90 tablet, Rfl: 3   dapagliflozin propanediol (FARXIGA) 10 MG TABS  tablet, Take 1 tablet (10 mg total) by mouth daily., Disp: 30 tablet, Rfl: 0   diphenhydrAMINE (BENADRYL) 25 MG tablet, Take 25 mg by mouth every 6 (six) hours as needed for allergies., Disp: , Rfl:    famotidine (PEPCID) 20 MG tablet, Take 20 mg by mouth daily as needed for indigestion. , Disp: , Rfl:    ferrous sulfate 325 (65 FE) MG tablet, Take 1 tablet (325 mg total) by mouth daily with breakfast., Disp: 30 tablet, Rfl: 3   furosemide (LASIX) 40 MG tablet, Take 1 tablet (40 mg total) by mouth daily. Take extra tablet in PM every other day., Disp: 30 tablet, Rfl: 0   metoprolol succinate (TOPROL-XL) 100 MG 24 hr tablet, Take 1 tablet (100 mg total) by mouth daily. Take with or immediately following a meal., Disp: 30 tablet, Rfl: 0   sacubitril-valsartan (ENTRESTO) 24-26 MG, Take 1 tablet by mouth 2 (two) times daily., Disp: 60 tablet, Rfl: 0   Saline 0.2 % SOLN, Place 1 spray into both nostrils daily as needed (for congestion)., Disp: , Rfl:    spironolactone (ALDACTONE) 25 MG tablet, Take 0.5 tablets (12.5 mg total) by mouth daily., Disp: 15 tablet, Rfl: 2   vitamin B-12 (CYANOCOBALAMIN) 1000 MCG tablet, Take 1 tablet (1,000 mcg total) by mouth daily., Disp: , Rfl:    vitamin C (ASCORBIC ACID) 500 MG tablet, Take 1,000 mg by mouth 2 (two) times daily. , Disp: , Rfl:   Current Facility-Administered Medications:    incobotulinumtoxinA (XEOMIN) 50 units injection 50 Units, 50 Units, Intramuscular, Q90 days, Marcial Pacas, MD, 50 Units at 11/30/16 1546   incobotulinumtoxinA (XEOMIN) 50 units injection 50 Units, 50 Units, Intramuscular, Q90 days, Marcial Pacas, MD, 50 Units at 07/25/17 0843   incobotulinumtoxinA (XEOMIN) 50 units injection 50 Units, 50 Units, Intramuscular, Q90 days, Marcial Pacas, MD, 50 Units at 07/26/17 1217  I spent 63 minutes dedicated to the care of this patient on the date of  this encounter to include pre-visit review of records, face-to-face time with the patient discussing  conditions above, post visit ordering of testing, clinical documentation with the electronic health record, making appropriate referrals as documented, and communicating necessary findings to members of the patients care team.   Garner Nash, DO [facility] 01/10/2022 3:31 PM

## 2022-01-10 NOTE — Progress Notes (Signed)
ReDS Vest / Clip - 01/10/22 1200       ReDS Vest / Clip   Station Marker C    Ruler Value 29    ReDS Value Range Low volume    ReDS Actual Value 21

## 2022-01-10 NOTE — Telephone Encounter (Signed)
Called to confirm Heart & Vascular Transitions of Care appointment at 12 noon on 01/10/22. Patient reminded to bring all medications and pill box organizer with them. Confirmed patient has transportation. Gave directions, instructed to utilize [facility] parking.  Confirmed appointment prior to ending call.    Earnestine Leys, BSN, Clinical cytogeneticist Only

## 2022-01-10 NOTE — Progress Notes (Addendum)
[facility] Center Transitions of Care Clinic Heart Failure Pharmacist Encounter  HPI: 34 YOWF w/ hx of HTN, CAD s/p CABG, CVA, atrial fibrillation, sick sinus syndrome s/p PPM, MVP with severe MR (noted after MV repair), CHF, dyslipidemia, and former smoking. Patient was last admitted on 12/17/21 w/ atrial tachycardia, dyspnea, and volume overload. Last Echo on 12/05/21 indicates a LVEF of 30-35% with moderately reduced RV. She was discharged on Entresto, Toprol, lasix, and [place].   She presented for follow up on 5/15 with HF TOC clinic. She indicated that she had LE edema in the evenings but is otherwise free of symptoms. ReDS 37%. Weight decreasing since discharge. Spironolactone 12.5 mg daily was added.   Today, she presents to the HF [facility] clinic for follow up. Patient feels well today. Patient reports no SOB, orthopnea, or PND. Reports improvement In evening edema since last visit. ReDS today 21%. Checking weight daily, weight is still decreasing (currently 175 lbs, 182 at last clinic visit). Endorses taking BP at least every other day with readings around 130/80. Following a low sodium and fluid restricted diet.   HF Medications: Diuretics: furosemide 40 mg twice daily Beta Blockers: Metoprolol XL 100 mg once daily ACE/ARB/ARNI: Entresto 24-26 mg BID MRA: Spironolactone 12.5 mg daily SGLT2i: Farxiga 10 mg once daily  Has the patient been experiencing any side effects to the medications prescribed?  no   Does the patient have any problems obtaining medications due to transportation or finances?   no - has Managed Medicaid   Understanding of regimen: good Understanding of indications: good Potential of compliance: good Patient understands to avoid NSAIDs. Patient understands to avoid decongestants.   Pertinent Lab Values: Serum creatinine 1.21, BUN 35, Potassium 4.3 , Sodium 138, BNP 1518.9   Vital Signs:          Weight: 175 lbs (last cclinic weight:182 lbs)           Blood pressure: 136/66          Heart rate: 60   Medication Assistance / Insurance Benefits Check: Does the patient have prescription insurance?  Yes Type of insurance plan: Managed Medicaid   Outpatient Pharmacy: Current outpatient pharmacy: [facility] Was the [facility] pharmacy used to supply discharge medications? yes If TOC pharmacy was used, were the refills transferred out to current pharmacy yet? yes Is the patient willing to transition their outpatient pharmacy to utilize a [facility] outpatient pharmacy with or without mail order?   no - [facility] not contracted with insurance   Assessment: 1) Chronic systolic CHF (EF 34-28%), due to NYHA class II symptoms. - Weight down today, volume good. ReDS 21%. Decrease furosemide to 40 mg daily with BID dosing every other day. - Continue spironolactone 12.5 mg daily - Continue Farxiga 10 mg tabs once daily - Continue Entresto 24-26 mg tabs BID  - Continue metoprolol succinate 100 mg XL tab once daily  Plan: 1) Medication changes: - Decrease furosemide to 40 mg daily with BID dosing every other day.   2) Patient Assistance: Delene Loll and Farxiga copay $4.00   3) Follow up - Next appointment with  on 01/27/2022 w/ APP  - TEE on 01/19/22 for MV workup  Barnet Pall, PharmD candidate

## 2022-01-14 ENCOUNTER — Other Ambulatory Visit (HOSPITAL_COMMUNITY): Payer: Self-pay

## 2022-01-14 ENCOUNTER — Other Ambulatory Visit: Payer: Self-pay

## 2022-01-14 MED ORDER — METOPROLOL SUCCINATE ER 100 MG PO TB24: 100.0000 mg | ORAL_TABLET | Freq: Every day | ORAL | 3 refills | Status: DC

## 2022-01-14 NOTE — Telephone Encounter (Signed)
Pt's medication was sent to pt's pharmacy as requested. Confirmation received.  °

## 2022-01-18 ENCOUNTER — Other Ambulatory Visit (HOSPITAL_COMMUNITY): Payer: Self-pay | Admitting: *Deleted

## 2022-01-18 ENCOUNTER — Other Ambulatory Visit (HOSPITAL_COMMUNITY): Payer: Self-pay

## 2022-01-18 DIAGNOSIS — I340 Nonrheumatic mitral (valve) insufficiency: Secondary | ICD-10-CM

## 2022-01-18 MED ORDER — SACUBITRIL-VALSARTAN 24-26 MG PO TABS: 1.0000 | ORAL_TABLET | Freq: Two times a day (BID) | ORAL | 0 refills | Status: DC

## 2022-01-18 MED ORDER — DAPAGLIFLOZIN PROPANEDIOL 10 MG PO TABS: 10.0000 mg | ORAL_TABLET | Freq: Every day | ORAL | 0 refills | Status: DC

## 2022-01-19 ENCOUNTER — Ambulatory Visit (HOSPITAL_BASED_OUTPATIENT_CLINIC_OR_DEPARTMENT_OTHER): Payer: Medicaid Other | Admitting: Anesthesiology

## 2022-01-19 ENCOUNTER — Encounter (HOSPITAL_COMMUNITY): Payer: Disability Insurance | Admitting: Internal Medicine

## 2022-01-19 ENCOUNTER — Other Ambulatory Visit: Payer: Self-pay

## 2022-01-19 ENCOUNTER — Ambulatory Visit (HOSPITAL_BASED_OUTPATIENT_CLINIC_OR_DEPARTMENT_OTHER)
Admission: RE | Admit: 2022-01-19 | Discharge: 2022-01-19 | Disposition: A | Discharge status: Home / Self Care | Payer: Medicaid Other | Source: Ambulatory Visit | Attending: Cardiovascular Disease | Admitting: Cardiovascular Disease

## 2022-01-19 ENCOUNTER — Ambulatory Visit (HOSPITAL_COMMUNITY)
Admission: RE | Admit: 2022-01-19 | Discharge: 2022-01-19 | Disposition: A | Discharge status: Home / Self Care | Payer: Medicaid Other | Attending: Cardiovascular Disease | Admitting: Cardiovascular Disease

## 2022-01-19 ENCOUNTER — Encounter (HOSPITAL_COMMUNITY)
Admission: RE | Disposition: A | Discharge status: Home / Self Care | Payer: Self-pay | Source: Home / Self Care | Attending: Cardiovascular Disease

## 2022-01-19 ENCOUNTER — Ambulatory Visit (HOSPITAL_COMMUNITY): Payer: Medicaid Other | Admitting: Anesthesiology

## 2022-01-19 ENCOUNTER — Encounter (HOSPITAL_COMMUNITY): Payer: Self-pay | Admitting: Cardiovascular Disease

## 2022-01-19 DIAGNOSIS — I081 Rheumatic disorders of both mitral and tricuspid valves: Secondary | ICD-10-CM

## 2022-01-19 DIAGNOSIS — Z79899 Other long term (current) drug therapy: Secondary | ICD-10-CM | POA: Insufficient documentation

## 2022-01-19 DIAGNOSIS — I340 Nonrheumatic mitral (valve) insufficiency: Secondary | ICD-10-CM

## 2022-01-19 DIAGNOSIS — D509 Iron deficiency anemia, unspecified: Secondary | ICD-10-CM | POA: Insufficient documentation

## 2022-01-19 DIAGNOSIS — I495 Sick sinus syndrome: Secondary | ICD-10-CM | POA: Diagnosis not present

## 2022-01-19 DIAGNOSIS — Z87891 Personal history of nicotine dependence: Secondary | ICD-10-CM | POA: Diagnosis not present

## 2022-01-19 DIAGNOSIS — I2510 Atherosclerotic heart disease of native coronary artery without angina pectoris: Secondary | ICD-10-CM

## 2022-01-19 DIAGNOSIS — I700 Atherosclerosis of aorta: Secondary | ICD-10-CM | POA: Insufficient documentation

## 2022-01-19 DIAGNOSIS — I110 Hypertensive heart disease with heart failure: Secondary | ICD-10-CM

## 2022-01-19 DIAGNOSIS — E785 Hyperlipidemia, unspecified: Secondary | ICD-10-CM | POA: Insufficient documentation

## 2022-01-19 DIAGNOSIS — I509 Heart failure, unspecified: Secondary | ICD-10-CM | POA: Diagnosis not present

## 2022-01-19 DIAGNOSIS — I4892 Unspecified atrial flutter: Secondary | ICD-10-CM | POA: Insufficient documentation

## 2022-01-19 DIAGNOSIS — I429 Cardiomyopathy, unspecified: Secondary | ICD-10-CM | POA: Insufficient documentation

## 2022-01-19 DIAGNOSIS — Z8673 Personal history of transient ischemic attack (TIA), and cerebral infarction without residual deficits: Secondary | ICD-10-CM | POA: Diagnosis not present

## 2022-01-19 DIAGNOSIS — Z951 Presence of aortocoronary bypass graft: Secondary | ICD-10-CM | POA: Diagnosis not present

## 2022-01-19 DIAGNOSIS — I5022 Chronic systolic (congestive) heart failure: Secondary | ICD-10-CM | POA: Insufficient documentation

## 2022-01-19 DIAGNOSIS — Z950 Presence of cardiac pacemaker: Secondary | ICD-10-CM | POA: Diagnosis not present

## 2022-01-19 DIAGNOSIS — I4891 Unspecified atrial fibrillation: Secondary | ICD-10-CM | POA: Diagnosis not present

## 2022-01-19 DIAGNOSIS — I052 Rheumatic mitral stenosis with insufficiency: Secondary | ICD-10-CM | POA: Diagnosis present

## 2022-01-19 HISTORY — PX: TEE WITHOUT CARDIOVERSION: SHX5443

## 2022-01-19 LAB — ECHO TEE
Area-P 1/2: 1.54 cm2
MV M vel: 4.75 m/s
MV Peak grad: 90.3 mmHg
MV VTI: 2.17 cm2

## 2022-01-19 SURGERY — ECHOCARDIOGRAM, TRANSESOPHAGEAL
Anesthesia: Monitor Anesthesia Care

## 2022-01-19 MED ORDER — LIDOCAINE 2% (20 MG/ML) 5 ML SYRINGE
INTRAMUSCULAR | Status: DC | PRN
  Administered 2022-01-19: 100 mg via INTRAVENOUS

## 2022-01-19 MED ORDER — BUTAMBEN-TETRACAINE-BENZOCAINE 2-2-14 % EX AERO
INHALATION_SPRAY | CUTANEOUS | Status: DC | PRN
  Administered 2022-01-19: 1 via TOPICAL

## 2022-01-19 MED ORDER — PROPOFOL 500 MG/50ML IV EMUL
INTRAVENOUS | Status: DC | PRN
  Administered 2022-01-19: 125 ug/kg/min via INTRAVENOUS

## 2022-01-19 MED ORDER — PHENYLEPHRINE 80 MCG/ML (10ML) SYRINGE FOR IV PUSH (FOR BLOOD PRESSURE SUPPORT)
PREFILLED_SYRINGE | INTRAVENOUS | Status: DC | PRN
  Administered 2022-01-19 (×2): 160 ug via INTRAVENOUS

## 2022-01-19 MED ORDER — SODIUM CHLORIDE 0.9 % IV SOLN: INTRAVENOUS | Status: DC | PRN

## 2022-01-19 MED ORDER — SODIUM CHLORIDE 0.45 % IV SOLN: INTRAVENOUS | Status: DC

## 2022-01-19 MED ORDER — PROPOFOL 10 MG/ML IV BOLUS
INTRAVENOUS | Status: DC | PRN
  Administered 2022-01-19: 30 mg via INTRAVENOUS
  Administered 2022-01-19 (×2): 20 mg via INTRAVENOUS
  Administered 2022-01-19: 30 mg via INTRAVENOUS

## 2022-01-19 NOTE — Interval H&P Note (Signed)
History and Physical Interval Note:  01/19/2022 8:17 AM  [name]  has presented today for surgery, with the diagnosis of severe MR, mitral valve repair.  The various methods of treatment have been discussed with the patient and family. After consideration of risks, benefits and other options for treatment, the patient has consented to  Procedure(s): TRANSESOPHAGEAL ECHOCARDIOGRAM (TEE) (N/A) as a surgical intervention.  The patient's history has been reviewed, patient examined, no change in status, stable for surgery.  I have reviewed the patient's chart and labs.  Questions were answered to the patient's satisfaction.    NPO for TEE. Severe MS. Prior annuloplasty. Also concerns, for MR.   Ashley Bells T. Audie Box, MD, [place]  [address] [place], [place] [address] [phone]  8:17 AM

## 2022-01-19 NOTE — Progress Notes (Signed)
  Echocardiogram Echocardiogram Transesophageal has been performed.  Ashley [name] M 01/19/2022, 10:43 AM

## 2022-01-19 NOTE — CV Procedure (Signed)
    TRANSESOPHAGEAL ECHOCARDIOGRAM   NAME:  Ashley [name]    MRN: [id number] DOB:  1966-01-15    ADMIT DATE: 01/19/2022  INDICATIONS: MS  PROCEDURE:   Informed consent was obtained prior to the procedure. The risks, benefits and alternatives for the procedure were discussed and the patient comprehended these risks.  Risks include, but are not limited to, cough, sore throat, vomiting, nausea, somnolence, esophageal and stomach trauma or perforation, bleeding, low blood pressure, aspiration, pneumonia, infection, trauma to the teeth and death.    Procedural time out performed. The oropharynx was anesthetized with topical 1% benzocaine.    Anesthesia was administered by Dr. Ola Spurr.  The patient was administered 624 mg of propofol and 100 mg of lidocaine to achieve and maintain moderate conscious sedation.  The patient's heart rate, blood pressure, and oxygen saturation are monitored continuously during the procedure. The period of conscious sedation is 30 minutes, of which I was present face-to-face 100% of this time.   The transesophageal probe was inserted in the esophagus and stomach without difficulty and multiple views were obtained.   COMPLICATIONS:    There were no immediate complications.  KEY FINDINGS:  26 mm prosthetic annuloplasty ring of the mitral valve.  Mean gradient 4 mmHg at 60 bpm.  Consistent with mild to moderate mitral stenosis.  Moderate mitral vegetation. Moderate to severe tricuspid regurgitation LVEF 30%.  Full report to follow. Further management per primary team.   [place] Bells T. Audie Box, MD, [facility]: [facility] [address]  [address], [place], [place] [address] [phone]  10:20 AM

## 2022-01-19 NOTE — Transfer of Care (Signed)
Immediate Anesthesia Transfer of Care Note  Patient: Ashley [name]  Procedure(s) Performed: TRANSESOPHAGEAL ECHOCARDIOGRAM (TEE)  Patient Location: Short Stay  Anesthesia Type:MAC  Level of Consciousness: awake  Airway & Oxygen Therapy: Patient Spontanous Breathing  Post-op Assessment: Report given to RN and Post -op Vital signs reviewed and stable  Post vital signs: Reviewed and stable  Last Vitals:  Vitals Value Taken Time  BP 116/41 01/19/22 1016  Temp    Pulse 62 01/19/22 1017  Resp 16 01/19/22 1017  SpO2 93 % 01/19/22 1017  Vitals shown include unvalidated device data.  Last Pain:  Vitals:   01/19/22 0815  TempSrc: Temporal  PainSc: 0-No pain         Complications: No notable events documented.

## 2022-01-19 NOTE — Discharge Instructions (Signed)

## 2022-01-19 NOTE — Anesthesia Procedure Notes (Signed)
Procedure Name: General with mask airway Date/Time: 01/19/2022 9:32 AM Performed by: Erick Colace, CRNA Pre-anesthesia Checklist: Patient identified, Emergency Drugs available, Suction available and Patient being monitored Patient Re-evaluated:Patient Re-evaluated prior to induction Oxygen Delivery Method: Nasal cannula Preoxygenation: Pre-oxygenation with 100% oxygen Induction Type: IV induction Airway Equipment and Method: Bite block Dental Injury: Teeth and Oropharynx as per pre-operative assessment

## 2022-01-19 NOTE — Anesthesia Preprocedure Evaluation (Signed)
Anesthesia Evaluation  Patient identified by MRN, date of birth, ID band Patient awake    Reviewed: Allergy & Precautions, NPO status , Patient's Chart, lab work & pertinent test results  Airway Mallampati: II  TM Distance: >3 FB Neck ROM: Full    Dental   Pulmonary former smoker,    breath sounds clear to auscultation       Cardiovascular hypertension, Pt. on medications + CAD, + CABG and +CHF  + dysrhythmias Atrial Fibrillation + Valvular Problems/Murmurs (s/p MVRepair with restricted post leaflet and now mod/severe MS, Moderate MR.) MR  Rhythm:Regular Rate:Normal     Neuro/Psych CVA    GI/Hepatic negative GI ROS, Neg liver ROS,   Endo/Other  negative endocrine ROS  Renal/GU Renal InsufficiencyRenal disease     Musculoskeletal   Abdominal   Peds  Hematology negative hematology ROS (+)   Anesthesia Other Findings   Reproductive/Obstetrics                             Anesthesia Physical Anesthesia Plan  ASA: 3  Anesthesia Plan: MAC   Post-op Pain Management: Minimal or no pain anticipated   Induction:   PONV Risk Score and Plan: 2 and Propofol infusion  Airway Management Planned: Nasal Cannula and Simple Face Mask  Additional Equipment:   Intra-op Plan:   Post-operative Plan:   Informed Consent: I have reviewed the patients History and Physical, chart, labs and discussed the procedure including the risks, benefits and alternatives for the proposed anesthesia with the patient or authorized representative who has indicated his/her understanding and acceptance.       Plan Discussed with:   Anesthesia Plan Comments:         Anesthesia Quick Evaluation

## 2022-01-20 NOTE — Anesthesia Postprocedure Evaluation (Signed)
Anesthesia Post Note  Patient: [place]  Procedure(s) Performed: TRANSESOPHAGEAL ECHOCARDIOGRAM (TEE)     Patient location during evaluation: PACU Anesthesia Type: MAC Level of consciousness: awake and alert Pain management: pain level controlled Vital Signs Assessment: post-procedure vital signs reviewed and stable Respiratory status: spontaneous breathing, nonlabored ventilation, respiratory function stable and patient connected to nasal cannula oxygen Cardiovascular status: stable and blood pressure returned to baseline Postop Assessment: no apparent nausea or vomiting Anesthetic complications: no   No notable events documented.  Last Vitals:  Vitals:   01/19/22 1025 01/19/22 1035  BP: 108/79 (!) 115/49  Pulse: (!) 59 (!) 57  Resp: 13 17  Temp:    SpO2: 97% 93%    Last Pain:  Vitals:   01/19/22 1035  TempSrc:   PainSc: 0-No pain                 Tiajuana Amass

## 2022-01-25 NOTE — Progress Notes (Signed)
  [facility]   MULTIDISCIPLINARY HEART VALVE TEAM  Pt's 5/31 TEE reviewed by the structural heart team and this shows mild-mod mitral stenosis, moderate MR and TR.  At this time the team felt that the pt does not require structural heart evaluation.  The pt should continue with cardiology follow up at this time. Currently the pt is scheduled for follow up with the heart failure team on 6/8.

## 2022-01-27 ENCOUNTER — Ambulatory Visit (HOSPITAL_COMMUNITY)
Admission: RE | Admit: 2022-01-27 | Discharge: 2022-01-27 | Disposition: A | Discharge status: Home / Self Care | Payer: Medicaid Other | Source: Ambulatory Visit | Attending: Physician Assistant | Admitting: Physician Assistant

## 2022-01-27 ENCOUNTER — Encounter (HOSPITAL_COMMUNITY): Payer: Self-pay

## 2022-01-27 VITALS — BP 132/60 | HR 60 | Wt 170.0 lb

## 2022-01-27 DIAGNOSIS — I5022 Chronic systolic (congestive) heart failure: Secondary | ICD-10-CM | POA: Insufficient documentation

## 2022-01-27 DIAGNOSIS — D509 Iron deficiency anemia, unspecified: Secondary | ICD-10-CM | POA: Diagnosis not present

## 2022-01-27 DIAGNOSIS — Z79899 Other long term (current) drug therapy: Secondary | ICD-10-CM | POA: Diagnosis not present

## 2022-01-27 DIAGNOSIS — Z87891 Personal history of nicotine dependence: Secondary | ICD-10-CM | POA: Diagnosis not present

## 2022-01-27 DIAGNOSIS — E785 Hyperlipidemia, unspecified: Secondary | ICD-10-CM | POA: Insufficient documentation

## 2022-01-27 DIAGNOSIS — Z951 Presence of aortocoronary bypass graft: Secondary | ICD-10-CM | POA: Diagnosis not present

## 2022-01-27 DIAGNOSIS — T82858A Stenosis of vascular prosthetic devices, implants and grafts, initial encounter: Secondary | ICD-10-CM | POA: Diagnosis not present

## 2022-01-27 DIAGNOSIS — I10 Essential (primary) hypertension: Secondary | ICD-10-CM | POA: Diagnosis not present

## 2022-01-27 DIAGNOSIS — I110 Hypertensive heart disease with heart failure: Secondary | ICD-10-CM | POA: Insufficient documentation

## 2022-01-27 DIAGNOSIS — I2510 Atherosclerotic heart disease of native coronary artery without angina pectoris: Secondary | ICD-10-CM | POA: Insufficient documentation

## 2022-01-27 DIAGNOSIS — Z950 Presence of cardiac pacemaker: Secondary | ICD-10-CM | POA: Diagnosis not present

## 2022-01-27 DIAGNOSIS — I495 Sick sinus syndrome: Secondary | ICD-10-CM | POA: Insufficient documentation

## 2022-01-27 DIAGNOSIS — I4892 Unspecified atrial flutter: Secondary | ICD-10-CM | POA: Diagnosis not present

## 2022-01-27 DIAGNOSIS — R918 Other nonspecific abnormal finding of lung field: Secondary | ICD-10-CM | POA: Diagnosis not present

## 2022-01-27 DIAGNOSIS — Z7984 Long term (current) use of oral hypoglycemic drugs: Secondary | ICD-10-CM | POA: Insufficient documentation

## 2022-01-27 DIAGNOSIS — I498 Other specified cardiac arrhythmias: Secondary | ICD-10-CM | POA: Diagnosis not present

## 2022-01-27 DIAGNOSIS — I361 Nonrheumatic tricuspid (valve) insufficiency: Secondary | ICD-10-CM

## 2022-01-27 DIAGNOSIS — I081 Rheumatic disorders of both mitral and tricuspid valves: Secondary | ICD-10-CM | POA: Insufficient documentation

## 2022-01-27 DIAGNOSIS — Z7901 Long term (current) use of anticoagulants: Secondary | ICD-10-CM | POA: Insufficient documentation

## 2022-01-27 DIAGNOSIS — Z9889 Other specified postprocedural states: Secondary | ICD-10-CM

## 2022-01-27 LAB — COMPREHENSIVE METABOLIC PANEL
ALT: 228 U/L — ABNORMAL HIGH (ref 0–44)
AST: 113 U/L — ABNORMAL HIGH (ref 15–41)
Albumin: 3.9 g/dL (ref 3.5–5.0)
Alkaline Phosphatase: 92 U/L (ref 38–126)
Anion gap: 9 (ref 5–15)
BUN: 32 mg/dL — ABNORMAL HIGH (ref 6–20)
CO2: 26 mmol/L (ref 22–32)
Calcium: 10.1 mg/dL (ref 8.9–10.3)
Chloride: 103 mmol/L (ref 98–111)
Creatinine, Ser: 1.2 mg/dL — ABNORMAL HIGH (ref 0.44–1.00)
GFR, Estimated: 53 mL/min — ABNORMAL LOW (ref >60)
Glucose, Bld: 123 mg/dL — ABNORMAL HIGH (ref 70–99)
Potassium: 4.7 mmol/L (ref 3.5–5.1)
Sodium: 138 mmol/L (ref 135–145)
Total Bilirubin: 1.9 mg/dL — ABNORMAL HIGH (ref 0.3–1.2)
Total Protein: 7 g/dL (ref 6.5–8.1)

## 2022-01-27 LAB — BRAIN NATRIURETIC PEPTIDE: B Natriuretic Peptide: 1132.5 pg/mL — ABNORMAL HIGH (ref 0.0–100.0)

## 2022-01-27 MED ORDER — SPIRONOLACTONE 25 MG PO TABS: 25.0000 mg | ORAL_TABLET | Freq: Every day | ORAL | 2 refills | Status: DC

## 2022-01-27 NOTE — Patient Instructions (Signed)
INCREASE Spironolactone to 25 mg one tab daily  Labs today We will only contact you if something comes back abnormal or we need to make some changes. Otherwise no news is good news!  Your physician recommends that you schedule a follow-up appointment in: 2-3 months with Dr Haroldine Laws   Do the following things EVERYDAY: Weigh yourself in the morning before breakfast. Write it down and keep it in a log. Take your medicines as prescribed Eat low salt foods--Limit salt (sodium) to 2000 mg per day.  Stay as active as you can everyday Limit all fluids for the day to less than 2 liters  At the [facility], you and your health needs are our priority. As part of our continuing mission to provide you with exceptional heart care, we have created designated Provider Care Teams. These Care Teams include your primary Cardiologist (physician) and Advanced Practice Providers (APPs- Physician Assistants and Nurse Practitioners) who all work together to provide you with the care you need, when you need it.   You may see any of the following providers on your designated Care Team at your next follow up: Dr Glori Bickers Dr Haynes Kerns, NP Lyda Jester, [place] [facility] [place], [place] Audry Riles, PharmD   Please be sure to bring in all your medications bottles to every appointment.   If you have any questions or concerns before your next appointment please send [place] a message through [place] or call our office at [phone].    TO LEAVE A MESSAGE FOR THE NURSE SELECT OPTION 2, PLEASE LEAVE A MESSAGE INCLUDING: YOUR NAME DATE OF BIRTH CALL BACK NUMBER REASON FOR CALL**this is important as we prioritize the call backs  YOU WILL RECEIVE A CALL BACK THE SAME DAY AS LONG AS YOU CALL BEFORE 4:00 PM

## 2022-01-27 NOTE — Progress Notes (Addendum)
ADVANCED HEART FAILURE CLINIC NOTE   Primary Care: Dr. Margarita Rana Primary Cardiologist: Establishing with Dr. Johney Frame HF Dr. Haroldine Laws   HPI: 56 y.o. female with history of CAD s/p CABG in 2013, MVP with severe MR s/p MV repair at time of CABG, chronic systolic CHF, sinus node dysfunction s/p St. 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   Found to be in atrial flutter with RVR 10/2018. EF down to 30% on echo. Underwent DCCV 01/2019. She had recurrence of AF but EF had improved to 60-65% on subsequent echo 07/20.   Echo 01/23: EF 50-55%  Has been on amiodarone for atrial arrhythmias. Amiodarone increased 03/23 d/t atrial tachycardia.   Admitted 04/23 with AT with RVR and volume overload. Attempts to pace out were unsuccessful and she underwent DCCV. She was readmitted about 10 days later with recurrent AF and volume overload. Also treated with abx for possible CAP. She was placed on amiodarone drip and converted to SR. Diuresed with IV lasix. Transitioned to P.O. lasxi 40 mg BID at discharge. Echo: EF 30-35%, RV moderately reduced, RVSP 58 mmHg, severe LAE, posterior leaflet mitral valve appears severely restricted, moderate to severe mitral stenosis with mean gradient 9 mmHg and moderate MR ( may not be fully visualized, thick CW doppler jet), severe TR  Seen in TOC for f/u 05/22. Weight down 7 lb from visit 1 week prior, almost 40 lb over a month. Lasix decreased to 40 mg daily alternating with 40 mg every other day.  TEE 01/19/22: EF 30-35%, , RV severely enlarged with moderately reduced function, severe LAE, mild mitral stenosis with MG 4 mmHg across mitral valve ring (likely related to small ring), moderate MR, RVSP 58 mmHg, moderate TR  Here today for close f/u and medication titration. She is feeling well. Dyspnea continues to improve. Weight is down to 170 lb, down 5 lb from last visit. She still has some dyspnea with  exertion but activity tolerance has improved significantly. No orthopnea, PND or LE edema. Planning to start walking on the treadmill. No CP or palpitations. Occasionally gets lightheaded if she stands up too quickly. Taking all medications as prescribed.  Has an occasional beer. Quit smoking April 2023.   Lives with her daughter and son.     Review of Systems: Cardiac and Respiratory. Negative except as mentioned in HPI   Past Medical History:  Diagnosis Date   Abdominal pain    ? chronic cholecystitis; admxn to [facility] 9/12 (CT, USN, HIDA done)   Atrial fibrillation ([place])    Cardiomyopathy    echo 9/12: mild LVH, EF 35-40%, mod MR (difficult to judge /post leaflet restricted), mod BAE, mod RVE, mod TR    Cardiomyopathy ([place])    a. echo 9/12: mild LVH, EF 35-40%, mod MR (difficult to judge /post leaflet restricted), mod BAE, mod RVE, mod TR. b. Echo 11/2011: mild LVH, EF 55-60%, s/p MV repair w/o sig MR/MS, mildly dilated RV/RA, PASP 50mHg. // AFL dx 10/2018 - Echo 10/2018: EF 30, diff HK, s/p MV repair with trivial MR, mean MV 7 but not c/w significant mitral stenosis, mild RAE, severe LAE, mod reduced RVSF    Carotid artery disease ([place]    a. Duplex 06/2015: 50% RECA, 1-39% BICA.   CHF (congestive heart failure) (HCC)    Clotting disorder (HCC)    Coronary artery disease    a. s/p CABG 10/2011 - LIMA-LAD, SVG-PDA, SVG-OM, SVG-diagonal.  Dyslipidemia    Essential hypertension    Heart murmur    Hemifacial spasm    History of Doppler ultrasound    a. Carotid [facility] [place], [name]) 7/17: no hemodynamically significant ICA stenosis   Hyperlipidemia    Mitral regurgitation    a. s/p MV repair 10/2011 - on Coumadin for 3 months afterwards then d/c'd by surgery.   Recurrent upper respiratory infection (URI)    S/P CABG x 4 10/21/2011   S/P mitral valve repair 10/21/2011   Stroke ([place]) 1989   a. age 6 - in setting of cigs and OCPs. //  b. s/p acute L parietal CVA >>  tPA - [facility] in [place], [name]   Stroke [facility]) 01/2017   Tobacco abuse     Current Outpatient Medications  Medication Sig Dispense Refill   acetaminophen (TYLENOL) 500 MG tablet Take 500-1,000 mg by mouth every 6 (six) hours as needed for headache.     amiodarone (PACERONE) 200 MG tablet Take 200 mg by mouth daily.     apixaban (ELIQUIS) 5 MG TABS tablet Take 1 tablet (5 mg total) by mouth 2 (two) times daily. 180 tablet 3   Ascorbic Acid (VITAMIN C) 1000 MG tablet Take 1,000 mg by mouth 2 (two) times daily.      atorvastatin (LIPITOR) 80 MG tablet Take 1 tablet (80 mg total) by mouth daily. 90 tablet 3   dapagliflozin propanediol (FARXIGA) 10 MG TABS tablet Take 1 tablet (10 mg total) by mouth daily. 30 tablet 0   diphenhydrAMINE (BENADRYL) 25 MG tablet Take 25 mg by mouth every 6 (six) hours as needed for allergies.     famotidine (PEPCID) 20 MG tablet Take 20 mg by mouth daily as needed for indigestion.      ferrous sulfate 325 (65 FE) MG tablet Take 1 tablet (325 mg total) by mouth daily with breakfast. 30 tablet 3   furosemide (LASIX) 40 MG tablet Take 1 tablet (40 mg total) by mouth daily. Take extra tablet in PM every other day. 30 tablet 0   Homeopathic Products (ARNICARE ARNICA EX) Apply 1 application. topically as needed (bruises).     metoprolol succinate (TOPROL-XL) 100 MG 24 hr tablet Take 1 tablet (100 mg total) by mouth daily. Take with or immediately following a meal. 90 tablet 3   sacubitril-valsartan (ENTRESTO) 24-26 MG Take 1 tablet by mouth 2 (two) times daily. 60 tablet 0   sodium chloride (OCEAN) 0.65 % SOLN nasal spray Place 1 spray into both nostrils daily as needed (for congestion).     vitamin B-12 (CYANOCOBALAMIN) 1000 MCG tablet Take 1 tablet (1,000 mcg total) by mouth daily.     spironolactone (ALDACTONE) 25 MG tablet Take 1 tablet (25 mg total) by mouth daily. 30 tablet 2   Current Facility-Administered Medications  Medication Dose Route Frequency  Provider Last Rate Last Admin   incobotulinumtoxinA (XEOMIN) 50 units injection 50 Units  50 Units Intramuscular Q90 days Marcial Pacas, MD   50 Units at 11/30/16 1546   incobotulinumtoxinA (XEOMIN) 50 units injection 50 Units  50 Units Intramuscular Q90 days Marcial Pacas, MD   50 Units at 07/25/17 0843   incobotulinumtoxinA (XEOMIN) 50 units injection 50 Units  50 Units Intramuscular Q90 days Marcial Pacas, MD   50 Units at 07/26/17 1217    Allergies  Allergen Reactions   Azithromycin Nausea Only   Latex Rash   Spinach Hives    Cooked  Tape Dermatitis and Other (See Comments)    PLEASE USE PAPER TAPE!! AFFECTS SKIN BADLY!!!!      Social History   Socioeconomic History   Marital status: Widowed    Spouse name: Not on file   Number of children: 2   Years of education: Not on file   Highest education level: Bachelor's degree (e.g., BA, AB, BS)  Occupational History   Occupation: Payroll/Paperwork    Comment: 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   Occupation: Disability    Comment: since march 2023  Tobacco Use   Smoking status: Former    Packs/day: 1.00    Years: 30.00    Total pack years: 30.00    Types: Cigarettes    Quit date: 05/07/2011    Years since quitting: 10.7   Smokeless tobacco: Never  Vaping Use   Vaping Use: Never used  Substance and Sexual Activity   Alcohol use: Yes    Alcohol/week: 1.0 standard drink of alcohol    Types: 1 Cans of beer per week    Comment: can of beer 1 x week   Drug use: No   Sexual activity: Yes  Other Topics Concern   Not on file  Social History Narrative   Lives at home with husband and son.   Right-handed.   2 cups caffeine daily.   Social Determinants of Health   Financial Resource Strain: Not on file  Food Insecurity: No Food Insecurity (12/21/2021)   Hunger Vital Sign    Worried About Running Out of Food in the Last Year: Never true    Ran Out of Food in the Last Year: Never true  Transportation Needs: No Transportation Needs  (12/21/2021)   PRAPARE - Hydrologist (Medical): No    Lack of Transportation (Non-Medical): No  Physical Activity: Not on file  Stress: Not on file  Social Connections: Not on file  Intimate Partner Violence: Not on file      Family History  Problem Relation Age of Onset   Hypertension Mother    Stroke Mother    Heart disease Father    COPD Father    Bone cancer Maternal Grandmother    Anesthesia problems Neg Hx    Hypotension Neg Hx    Malignant hyperthermia Neg Hx    Pseudochol deficiency Neg Hx    Colon cancer Neg Hx     Vitals:   01/27/22 1336  BP: 132/60  Pulse: 60  SpO2: 94%  Weight: 77.1 kg (170 lb)      PHYSICAL EXAM: General:  Well appearing. Ambulated into clinic. HEENT: normal Neck: supple. no JVD. Carotids 2+ bilat; no bruits.  Cor: PMI nondisplaced. Regular rate & rhythm. No rubs, gallops, 2/6 TR murmur Lungs: clear Abdomen: soft, nontender, nondistended.  Extremities: no cyanosis, clubbing, rash, edema Neuro: alert & orientedx3, cranial nerves grossly intact. moves all 4 extremities w/o difficulty. Affect pleasant   ASSESSMENT & PLAN: Chronic systolic CHF: -Echo 86/7619: EF 55-60% -Echo 10/2018: EF 30%. In setting of AFL with RVR -Echo 02/2019: EF 60-65% -Echo 01/23: EF 50-55% -Recurrent atrial arrhythmias end of January 2023 -Echo 04/23 in setting of atrial tach with RVR: EF 30-35%, mildly D-shaped interventicular spetum suggesting degree of RV overload, RV moderately reduced, RVSP 58 mmHg, severe LAE, s/p MV repair, posterior leaflet appears severely restricted, mean gradient across MV 9 mmHg with MVA 1.08cm2 by VTI suggesting moderate to severe MS, at least moderate MR, severe TR, dilated IVC with  estimated RAP 15 mmHg -TEE 01/19/22: EF 30-35%, , RV severely enlarged with moderately reduced function, severe LAE, mild mitral stenosis with MG 4 mmHg across mitral valve ring (likely related to small ring), moderate MR,  RVSP 58 mmHg, moderate TR -Etiology not certain. ? D/t progressive valvular disease +/- tachymediated in setting of atrial arrhythmias. EF has previously recovered with restoration of SR. No significant improvement in EF on recent TEE after maintaining SR. Has known hx CAD and prior CABG. Will review with Dr. Haroldine Laws to see if need to consider LHC.  -NYHA II/early 3. Volume looks good on exam. Continue furosemide 40 mg daily alternating with 40 mg BID -Continue Metoprolol XL 100 mg daily -Continue Entresto 24/26 mg BID -Continue Farxiga 10 mg daily -Increase spiro to 25 mg daily -Can increase Entresto next visit if BP allows -CMET/BNP today, BMET in 1 week -Will plan for repeat TTE at time of next f/u  2. CAD: -S/p CABG (LIMA to LAD, SVG to PDA, SVG to OM, SVG to diagonal) in 2013 -No aspirin with anticoagulation -Continue statin -Denies angina  3. S/p mitral valve repair with MR/MS: -Echo 04/23: EF 30-35%, severe MR and possibly severe mitral stenosis. Posterior leaflet restricted.  -TEE 05/31: EF 30-35%, mild mitral valve stenosis with mean gradient 4 mmHg (d/t small ring) and moderate MR  4. Tricuspid regurgitation: -Severe on echo 04/23 -Moderate on TEE 05/23  4. Atrial arrhythmias - AT/AFL/AF: -ECG today atrial paced 60 -Continue amiodarone 200 mg daily per EP -TSH okay 03/23. Checking LFTs today. -Anticoagulated with Eliquis 5 mg BID.   5. Tobacco use: -Quit smoking cold [place] 04/23  6. HTN:  -BP stable -GDMT as above  7. HLD:  -On high-intensity statin  8. Iron deficiency anemia: -Received feraheme during admit 04/23 -Hgb stable at 13 in 05/23  9. Pulmonary nodules: -Noted on CT scan 04/23. Nodularity up to 2 cm right lung apex and areas of ground-glass nodularity RUL. Findings possibly infectious or inflammatory. Neoplasm not exculded. -Recently saw Pulmonary. Planning for repeat imaging August 2023.     Follow up: Sees Dr. Johney Frame July 2023, f/u Dr.  Haroldine Laws in August with echo  Marlyce Huge PA-C

## 2022-01-28 ENCOUNTER — Telehealth (HOSPITAL_COMMUNITY): Payer: Self-pay | Admitting: Surgery

## 2022-01-28 DIAGNOSIS — I5022 Chronic systolic (congestive) heart failure: Secondary | ICD-10-CM

## 2022-01-28 NOTE — Telephone Encounter (Signed)
I attempted to reach patient to review results and recommendations per provider.  I was unable to leave a message.  Someone from the [facility] will attempt again to reach patient.

## 2022-01-28 NOTE — Telephone Encounter (Signed)
-----   Message from Joette Catching, [place] sent at 01/27/2022  3:41 PM EDT ----- Labs stable other than LFTs elevated. Can be due to HF or cholesterol med. Avoid alcohol.  Check hepatic function panel in about 4 weeks.

## 2022-01-28 NOTE — Telephone Encounter (Signed)
Patient called back and results/ercommendations were reviewed.  She returns on June 19th for follow-up lab work.  LFTs added to this appt. at patient's request.

## 2022-01-31 ENCOUNTER — Other Ambulatory Visit (HOSPITAL_COMMUNITY): Payer: Self-pay | Admitting: *Deleted

## 2022-01-31 MED ORDER — FUROSEMIDE 40 MG PO TABS: 40.0000 mg | ORAL_TABLET | Freq: Every day | ORAL | 3 refills | Status: DC

## 2022-02-01 NOTE — Addendum Note (Signed)
Encounter addended by: Joette Catching, PA-C on: 02/01/2022 1:41 PM  Actions taken: Clinical Note Signed

## 2022-02-07 ENCOUNTER — Ambulatory Visit (HOSPITAL_COMMUNITY)
Admission: RE | Admit: 2022-02-07 | Discharge: 2022-02-07 | Disposition: A | Discharge status: Home / Self Care | Payer: Medicaid Other | Source: Ambulatory Visit | Attending: Internal Medicine | Admitting: Internal Medicine

## 2022-02-07 ENCOUNTER — Telehealth (HOSPITAL_COMMUNITY): Payer: Self-pay | Admitting: Cardiology

## 2022-02-07 DIAGNOSIS — I5022 Chronic systolic (congestive) heart failure: Secondary | ICD-10-CM | POA: Diagnosis not present

## 2022-02-07 LAB — BASIC METABOLIC PANEL
Anion gap: 14 (ref 5–15)
BUN: 31 mg/dL — ABNORMAL HIGH (ref 6–20)
CO2: 27 mmol/L (ref 22–32)
Calcium: 9.9 mg/dL (ref 8.9–10.3)
Chloride: 102 mmol/L (ref 98–111)
Creatinine, Ser: 1.29 mg/dL — ABNORMAL HIGH (ref 0.44–1.00)
GFR, Estimated: 48 mL/min — ABNORMAL LOW (ref >60)
Glucose, Bld: 124 mg/dL — ABNORMAL HIGH (ref 70–99)
Potassium: 4.2 mmol/L (ref 3.5–5.1)
Sodium: 143 mmol/L (ref 135–145)

## 2022-02-07 MED ORDER — FUROSEMIDE 40 MG PO TABS: 40.0000 mg | ORAL_TABLET | Freq: Every day | ORAL | 3 refills | Status: DC

## 2022-02-07 NOTE — Telephone Encounter (Signed)
Pt aware.

## 2022-02-07 NOTE — Telephone Encounter (Signed)
-----   Message from Joette Catching, [place] sent at 02/07/2022  2:09 PM EDT ----- Scr up a little from baseline. Decrease furosemide to 40 mg daily, can take an extra 40 mg as needed

## 2022-02-13 NOTE — Progress Notes (Deleted)
Cardiology Office Note:    Date:  02/13/2022   ID:  Navarro, DOB 20-Apr-1966, MRN [id number]  PCP:  [name], MD   [facility] Providers Cardiologist:  Dorris Carnes, MD Electrophysiologist:  Virl Axe, MD {  Referring MD: [name], MD    History of Present Illness:    Ashley [name] is a 56 y.o. female with a hx of CAD s/p CABG in 2013, MVP with severe MR s/p MV repair at time of CABG, chronic systolic CHF, sinus node dysfunction s/p St. Jude pacer 11/20, Hx CVA 2017 treated with tPA, atrial flutter/fibrillation, HTN, HLD, tobacco use who presents to clinic for follow-up.  Per review of the record, patient was found to be in atrial flutter with RVR 10/2018. EF down to 30% on echo. Underwent DCCV 01/2019. She had recurrence of AF but EF had improved to 60-65% on subsequent echo 07/20. Repeat TTE 08/2021 showed EF 50-55%.   Was admitted 11/2021 with with Atach with RVR and volume overload. Attempts to pace out were unsuccessful and she underwent DCCV. She was readmitted about 10 days later with recurrent AF and volume overload. She was placed on amiodarone drip and converted to SR. Diuresed with IV lasix. Transitioned to P.O. lasxi 40 mg BID at discharge. Echo 11/2021: EF 30-35%, RV moderately reduced, RVSP 58 mmHg, severe LAE, posterior leaflet mitral valve appears severely restricted, moderate to severe mitral stenosis with mean gradient 9 mmHg and moderate MR ( may not be fully visualized, thick CW doppler jet), severe TR.  She subsequently underwent TEE with Dr. Marisue Ivan on 01/19/22 for further evaluation of her MR which showed 40m annuloplasty ring with mean gradient 464mg at HR 61 consistent with mild-to-moderate MS. Moderate MR due to restriction of the posterior MV leaflet with 3D VC 0.21. EF 30-35%, moderate RV dysfunction with severe RV enlargement, moderate TR.  Today, ***  Past Medical History:  Diagnosis Date   Abdominal pain    ? chronic  cholecystitis; [facility] ([facility], USN, HIDA done)   Atrial fibrillation ([facility]   Cardiomyopathy    echo 9/12: mild LVH, EF 35-40%, mod MR (difficult to judge /post leaflet restricted), mod BAE, mod RVE, mod TR    Cardiomyopathy ([facility]   a. echo 9/12: mild LVH, EF 35-40%, mod MR (difficult to judge /post leaflet restricted), mod BAE, mod RVE, mod TR. b. Echo 11/2011: mild LVH, EF 55-60%, s/p MV repair w/o sig MR/MS, mildly dilated RV/RA, PASP 3427m. // AFL dx 10/2018 - Echo 10/2018: EF 30, diff HK, s/p MV repair with trivial MR, mean MV 7 but not c/w significant mitral stenosis, mild RAE, severe LAE, mod reduced RVSF    Carotid artery disease ([facility]  a. Duplex 06/2015: 50% RECA, 1-39% BICA.   CHF (congestive heart failure) (HCC)    Clotting disorder (HCC)    Coronary artery disease    a. s/p CABG 10/2011 - LIMA-LAD, SVG-PDA, SVG-OM, SVG-diagonal.   Dyslipidemia    Essential hypertension    Heart murmur    Hemifacial spasm    History of Doppler ultrasound    a. [name]: no hemodynamically significant ICA stenosis   Hyperlipidemia    Mitral regurgitation    a. s/p MV repair 10/2011 - on Coumadin for 3 months afterwards then d/c'd by surgery.   Recurrent upper respiratory infection (URI)    S/P CABG x 4 10/21/2011   S/P  mitral valve repair 10/21/2011   Stroke [facility]) 1989   a. age 78 - in setting of cigs and OCPs. //  b. s/p acute L parietal CVA >> tPA - [facility] in [place], [name]   Stroke [facility]) 01/2017   Tobacco abuse     Past Surgical History:  Procedure Laterality Date   CARDIAC CATHETERIZATION     CARDIOVERSION N/A 01/25/2019   Procedure: CARDIOVERSION;  Surgeon: [name], MD;  Location: [facility] ENDOSCOPY;  Service: Cardiovascular;  Laterality: N/A;   CARDIOVERSION N/A 06/14/2019   Procedure: CARDIOVERSION;  Surgeon: [name], MD;  Location: [facility] ENDOSCOPY;  Service: Cardiovascular;  Laterality: N/A;   [place]   twins   CORONARY ARTERY BYPASS GRAFT  10/21/2011   Procedure: CORONARY ARTERY BYPASS GRAFTING (CABG);  Surgeon: [name], MD;  Location: [place];  Service: Open Heart Surgery;  Laterality: N/A;  cabg x four, using right leg greater saphenous vein harvested endoscopically   DENTAL SURGERY     MITRAL VALVE REPAIR  10/21/2011   Procedure: MITRAL VALVE REPAIR (MVR);  Surgeon: [name], MD;  Location: [place];  Service: Open Heart Surgery;  Laterality: N/A;   PACEMAKER IMPLANT N/A 06/25/2019   Procedure: PACEMAKER IMPLANT;  Surgeon: [name], MD;  Location: [place] CV LAB;  Service: Cardiovascular;  Laterality: N/A;   TEE WITHOUT CARDIOVERSION  06/30/2011   Procedure: TRANSESOPHAGEAL ECHOCARDIOGRAM (TEE);  Surgeon: [name], MD;  Location[facility] ENDOSCOPY;  Service: Cardiovascular;  Laterality: N/A;   TEE WITHOUT CARDIOVERSION N/A 01/19/2022   Procedure: TRANSESOPHAGEAL ECHOCARDIOGRAM (TEE);  Surgeon: Geralynn Rile, MD;  Location: [place];  Service: Cardiovascular;  Laterality: N/A;   TONSILLECTOMY     TUBAL LIGATION      Current Medications: No outpatient medications have been marked as taking for the 02/25/22 encounter (Appointment) with Freada Bergeron, MD.   Current Facility-Administered Medications for the 02/25/22 encounter (Appointment) with Freada Bergeron, MD  Medication   incobotulinumtoxinA (XEOMIN) 50 units injection 50 Units   incobotulinumtoxinA (XEOMIN) 50 units injection 50 Units   incobotulinumtoxinA (XEOMIN) 50 units injection 50 Units     Allergies:   Azithromycin, Latex, Spinach, and Tape   Social History   Socioeconomic History   Marital status: Widowed    Spouse name: Not on file   Number of children: 2   Years of education: Not on file   Highest education level: Bachelor's degree (e.g., BA, AB, BS)  Occupational History   Occupation: Payroll/Paperwork    Comment: 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    Occupation: Disability    Comment: since march 2023  Tobacco Use   Smoking status: Former    Packs/day: 1.00    Years: 30.00    Total pack years: 30.00    Types: Cigarettes    Quit date: 05/07/2011    Years since quitting: 10.7   Smokeless tobacco: Never  Vaping Use   Vaping Use: Never used  Substance and Sexual Activity   Alcohol use: Yes    Alcohol/week: 1.0 standard drink of alcohol    Types: 1 Cans of beer per week    Comment: can of beer 1 x week   Drug use: No   Sexual activity: Yes  Other Topics Concern   Not on file  Social History Narrative   Lives at home with husband and son.   Right-handed.   2 cups caffeine daily.   Social Determinants  of Health   Financial Resource Strain: Not on file  Food Insecurity: No Food Insecurity (12/21/2021)   Hunger Vital Sign    Worried About Running Out of Food in the Last Year: Never true    Ran Out of Food in the Last Year: Never true  Transportation Needs: No Transportation Needs (12/21/2021)   PRAPARE - Hydrologist (Medical): No    Lack of Transportation (Non-Medical): No  Physical Activity: Not on file  Stress: Not on file  Social Connections: Not on file     Family History: The patient's ***family history includes Bone cancer in her maternal grandmother; COPD in her father; Heart disease in her father; Hypertension in her mother; Stroke in her mother. There is no history of Anesthesia problems, Hypotension, Malignant hyperthermia, Pseudochol deficiency, or Colon cancer.  ROS:   Please see the history of present illness.    *** All other systems reviewed and are negative.  EKGs/Labs/Other Studies Reviewed:    The following studies were reviewed today: TEE 02-03-22: IMPRESSIONS     1. 26 mm annuloplasty ring. MG 4.0 mmHG @ 61 bpm. Mild to moderate mitral  stenosis is present likely related to the small annuloplasty ring.  Leaflets are mobile. MVA by directly 3D planimetry 1.71 cm2. MVA by  VTI  2.17 cm2. Moderate mitral regurgitation  is present likely related to restricted movement of the PMVL. 3D VCA 0.21  cm2. The mitral valve has been repaired/replaced. Moderate mitral valve  regurgitation. Mild to moderate mitral stenosis. There is a 26 mm RING  MITRAL MEMO 3D 26MM prosthetic  annuloplasty ring present in the mitral position. Procedure Date:  10/21/2011.   2. Left ventricular ejection fraction, by estimation, is 30 to 35%. The  left ventricle has moderately decreased function. The left ventricle  demonstrates global hypokinesis. The left ventricular internal cavity size  was mildly dilated.   3. Right ventricular systolic function is moderately reduced. The right  ventricular size is severely enlarged. There is moderately elevated  pulmonary artery systolic pressure.   4. Left atrial size was severely dilated. No left atrial/left atrial  appendage thrombus was detected. The LAA emptying velocity was 23 cm/s.   5. Right atrial size was severely dilated.   6. Moderate tricuspid regurgitation. 3D VCA 0.27 cm2. Tricuspid valve  regurgitation is moderate.   7. The aortic valve is tricuspid. Aortic valve regurgitation is not  visualized. No aortic stenosis is present.   8. There is Moderate (Grade III) layered plaque involving the descending  aorta.   TEE 2022/02/03: IMPRESSIONS   1. 26 mm annuloplasty ring. MG 4.0 mmHG @ 61 bpm. Mild to moderate mitral  stenosis is present likely related to the small annuloplasty ring.  Leaflets are mobile. MVA by directly 3D planimetry 1.71 cm2. MVA by VTI  2.17 cm2. Moderate mitral regurgitation  is present likely related to restricted movement of the PMVL. 3D VCA 0.21  cm2. The mitral valve has been repaired/replaced. Moderate mitral valve  regurgitation. Mild to moderate mitral stenosis. There is a 26 mm RING  MITRAL MEMO 3D 26MM prosthetic  annuloplasty ring present in the mitral position. Procedure Date:  10/21/2011.   2. Left  ventricular ejection fraction, by estimation, is 30 to 35%. The  left ventricle has moderately decreased function. The left ventricle  demonstrates global hypokinesis. The left ventricular internal cavity size  was mildly dilated.   3. Right ventricular systolic function is moderately reduced. The right  ventricular size is severely enlarged. There is moderately elevated  pulmonary artery systolic pressure.   4. Left atrial size was severely dilated. No left atrial/left atrial  appendage thrombus was detected. The LAA emptying velocity was 23 cm/s.   5. Right atrial size was severely dilated.   6. Moderate tricuspid regurgitation. 3D VCA 0.27 cm2. Tricuspid valve  regurgitation is moderate.   7. The aortic valve is tricuspid. Aortic valve regurgitation is not  visualized. No aortic stenosis is present.   8. There is Moderate (Grade III) layered plaque involving the descending  aorta.   TTE 12/06/21: IMPRESSIONS   1. Left ventricular ejection fraction, by estimation, is 30 to 35%. The  left ventricle has moderately decreased function. The left ventricle  demonstrates global hypokinesis. Left ventricular diastolic parameters are  indeterminate.   2. Mildly D-shaped interventricular septum suggests a degree of RV  pressure/volume overload. Right ventricular systolic function is  moderately reduced. The right ventricular size is moderately enlarged.  There is moderately elevated pulmonary artery  systolic pressure. The estimated right ventricular systolic pressure is  73.7 mmHg.   3. Left atrial size was severely dilated.   4. Right atrial size was moderately dilated.   5. S/p mitral valve repair. The posterior leaflet appears severely  restricted. Mean gradient 9 mmHg with MVA 1.08 cm^2 by VTI suggests  moderate-severe mitral stenosis. At least moderate mitral regurgitation  (may not be fully visualized, thick CW doppler   jet).   6. The tricuspid valve is abnormal. Tricuspid valve  regurgitation is  severe.   7. The aortic valve is tricuspid. There is mild calcification of the  aortic valve. Aortic valve regurgitation is trivial. Aortic valve  sclerosis/calcification is present, without any evidence of aortic  stenosis.   8. The inferior vena cava is dilated in size with <50% respiratory  variability, suggesting right atrial pressure of 15 mmHg.   EKG:  EKG is *** ordered today.  The ekg ordered today demonstrates ***  Recent Labs: 11/01/2021: TSH 1.860 01/03/2022: Hemoglobin 13.1; Platelets 244 01/27/2022: ALT 228; B Natriuretic Peptide 1,132.5 02/07/2022: BUN 31; Creatinine, Ser 1.29; Potassium 4.2; Sodium 143  Recent Lipid Panel    Component Value Date/Time   CHOL 114 11/16/2017 0838   TRIG 68 11/16/2017 0838   HDL 43 11/16/2017 0838   CHOLHDL 2.7 11/16/2017 0838   CHOLHDL 3.8 07/01/2016 0939   VLDL 25 07/01/2016 0939   LDLCALC 57 11/16/2017 0838     Risk Assessment/Calculations:   {Does this patient have ATRIAL FIBRILLATION?:[phone]}       Physical Exam:    VS:  LMP 05/18/2015 Comment: went for 3 week after skipping two months     Wt Readings from Last 3 Encounters:  01/27/22 170 lb (77.1 kg)  01/19/22 175 lb 12.8 oz (79.7 kg)  01/10/22 175 lb 8 oz (79.6 kg)     GEN: *** Well nourished, well developed in no acute distress HEENT: Normal NECK: No JVD; No carotid bruits LYMPHATICS: No lymphadenopathy CARDIAC: ***RRR, no murmurs, rubs, gallops RESPIRATORY:  Clear to auscultation without rales, wheezing or rhonchi  ABDOMEN: Soft, non-tender, non-distended MUSCULOSKELETAL:  No edema; No deformity  SKIN: Warm and dry NEUROLOGIC:  Alert and oriented x 3 PSYCHIATRIC:  Normal affect   ASSESSMENT:    No diagnosis found. PLAN:    In order of problems listed above:  #Chronic Systolic Heart Failure: Patient with initial drop in EF in 10/2018 to 30% in the setting of aflutter with  RVR. Recovered to 60-65% in 02/2019 and dropped slightly to  50-55% in 08/2021. Had recurrent atrial arrhythmias in 08/2021 and repeat TTE in the setting of atach with RVR showed  EF 30-35%, mildly D-shaped interventicular spetum suggesting degree of RV overload, RV moderately reduced, RVSP 58 mmHg, severe LAE, s/p MV repair, posterior leaflet appears severely restricted, mean gradient across MV 9 mmHg with MVA 1.08cm2 by VTI suggesting moderate to severe MS, at least moderate MR, severe TR, dilated IVC with estimated RAP 15 mmHg. 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 Patient also with history of CAD s/p CABG. Currently, she appears euvolemic on exam. NYHA class III symptoms.  -Continue lasix '40mg'$  daily with extra dose as needed for weight gain -Continue Metoprolol XL 100 mg daily -Continue Entresto 24/26 mg BID -Continue Farxiga 10 mg daily -Continue spironolactone 12.'5mg'$  BID   #CAD: S/p CABG (LIMA to LAD, SVG to PDA, SVG to OM, SVG to diagonal) in 2013. Currently without anginal symptoms. -No aspirin with anticoagulation -Continue lipitor '80mg'$  daily  #Atrial Fibrillation/flutter/atrial tachycardia: Followed by EP. Currently on amiodarone and apixaban for [facility] -Continue amiodarone '200mg'$  daily -Continue apixaban '5mg'$  BID for [facility] -Follow-up with EP as scheduled   #S/p mitral valve repair with severe moderate MR and mild to moderate MS: TEE 12/2021 with mild-to-moderate MS with mean gradient 14mHg at HR 61bpm and moderate MR. Will continue with serial monitoring.    #Moderate TR: -Serial monitoring as above   #HTN:  BP well controlled. -Continue Metoprolol XL 100 mg daily -Continue Entresto 24/26 mg BID -Continue spironolactone 12.'5mg'$  BID  #HLD:  -Continue lipitor '80mg'$  daily  #History of Tobacco Abuse: -Quit 11/2021   #Iron deficiency anemia: -Received feraheme during admit 04/23 -Hgb 10.1 in 04/23.  -No overt bleeding -CBC  today      {Are you ordering a CV Procedure (e.g. stress test, cath, DCCV, TEE, etc)?   Press F2        :[id number]}   Medication Adjustments/Labs and Tests Ordered: Current medicines are reviewed at length with the patient today.  Concerns regarding medicines are outlined above.  No orders of the defined types were placed in this encounter.  No orders of the defined types were placed in this encounter.   There are no Patient Instructions on file for this visit.   Signed, HFreada Bergeron MD  02/13/2022 4:09 PM    [facility]

## 2022-02-19 ENCOUNTER — Other Ambulatory Visit (HOSPITAL_COMMUNITY): Payer: Self-pay | Admitting: Physician Assistant

## 2022-02-21 ENCOUNTER — Other Ambulatory Visit (HOSPITAL_COMMUNITY): Payer: Self-pay | Admitting: *Deleted

## 2022-02-21 MED ORDER — DAPAGLIFLOZIN PROPANEDIOL 10 MG PO TABS: 10.0000 mg | ORAL_TABLET | Freq: Every day | ORAL | 3 refills | Status: DC

## 2022-02-21 MED ORDER — ENTRESTO 24-26 MG PO TABS: 1.0000 | ORAL_TABLET | Freq: Two times a day (BID) | ORAL | 3 refills | Status: DC

## 2022-02-24 ENCOUNTER — Emergency Department (HOSPITAL_COMMUNITY): Payer: Medicaid Other

## 2022-02-24 ENCOUNTER — Encounter (HOSPITAL_COMMUNITY): Payer: Self-pay | Admitting: Emergency Medicine

## 2022-02-24 ENCOUNTER — Emergency Department (HOSPITAL_COMMUNITY)
Admission: EM | Admit: 2022-02-24 | Discharge: 2022-02-24 | Disposition: A | Discharge status: Home / Self Care | Payer: Medicaid Other | Attending: Emergency Medicine | Admitting: Emergency Medicine

## 2022-02-24 ENCOUNTER — Other Ambulatory Visit: Payer: Self-pay

## 2022-02-24 DIAGNOSIS — M25562 Pain in left knee: Secondary | ICD-10-CM | POA: Diagnosis not present

## 2022-02-24 DIAGNOSIS — Z7901 Long term (current) use of anticoagulants: Secondary | ICD-10-CM | POA: Insufficient documentation

## 2022-02-24 DIAGNOSIS — Y93K1 Activity, walking an animal: Secondary | ICD-10-CM | POA: Diagnosis not present

## 2022-02-24 DIAGNOSIS — S2241XA Multiple fractures of ribs, right side, initial encounter for closed fracture: Secondary | ICD-10-CM | POA: Diagnosis not present

## 2022-02-24 DIAGNOSIS — S40811A Abrasion of right upper arm, initial encounter: Secondary | ICD-10-CM | POA: Diagnosis not present

## 2022-02-24 DIAGNOSIS — Z23 Encounter for immunization: Secondary | ICD-10-CM | POA: Diagnosis not present

## 2022-02-24 DIAGNOSIS — S60222A Contusion of left hand, initial encounter: Secondary | ICD-10-CM | POA: Diagnosis not present

## 2022-02-24 DIAGNOSIS — R519 Headache, unspecified: Secondary | ICD-10-CM | POA: Diagnosis present

## 2022-02-24 DIAGNOSIS — S022XXA Fracture of nasal bones, initial encounter for closed fracture: Secondary | ICD-10-CM | POA: Diagnosis not present

## 2022-02-24 DIAGNOSIS — M25561 Pain in right knee: Secondary | ICD-10-CM | POA: Diagnosis not present

## 2022-02-24 DIAGNOSIS — S0990XA Unspecified injury of head, initial encounter: Secondary | ICD-10-CM | POA: Diagnosis not present

## 2022-02-24 DIAGNOSIS — Z043 Encounter for examination and observation following other accident: Secondary | ICD-10-CM | POA: Diagnosis not present

## 2022-02-24 DIAGNOSIS — S0181XA Laceration without foreign body of other part of head, initial encounter: Secondary | ICD-10-CM | POA: Diagnosis not present

## 2022-02-24 DIAGNOSIS — R9431 Abnormal electrocardiogram [ECG] [EKG]: Secondary | ICD-10-CM | POA: Diagnosis not present

## 2022-02-24 DIAGNOSIS — S60221A Contusion of right hand, initial encounter: Secondary | ICD-10-CM | POA: Insufficient documentation

## 2022-02-24 DIAGNOSIS — W01198A Fall on same level from slipping, tripping and stumbling with subsequent striking against other object, initial encounter: Secondary | ICD-10-CM | POA: Diagnosis not present

## 2022-02-24 DIAGNOSIS — S2232XA Fracture of one rib, left side, initial encounter for closed fracture: Secondary | ICD-10-CM | POA: Insufficient documentation

## 2022-02-24 DIAGNOSIS — S025XXA Fracture of tooth (traumatic), initial encounter for closed fracture: Secondary | ICD-10-CM | POA: Insufficient documentation

## 2022-02-24 DIAGNOSIS — Z91040 Latex allergy status: Secondary | ICD-10-CM | POA: Diagnosis not present

## 2022-02-24 DIAGNOSIS — S0993XA Unspecified injury of face, initial encounter: Secondary | ICD-10-CM | POA: Diagnosis not present

## 2022-02-24 DIAGNOSIS — W19XXXA Unspecified fall, initial encounter: Secondary | ICD-10-CM

## 2022-02-24 DIAGNOSIS — T1490XA Injury, unspecified, initial encounter: Secondary | ICD-10-CM | POA: Diagnosis not present

## 2022-02-24 LAB — COMPREHENSIVE METABOLIC PANEL
ALT: 121 U/L — ABNORMAL HIGH (ref 0–44)
AST: 60 U/L — ABNORMAL HIGH (ref 15–41)
Albumin: 4.2 g/dL (ref 3.5–5.0)
Alkaline Phosphatase: 98 U/L (ref 38–126)
Anion gap: 14 (ref 5–15)
BUN: 30 mg/dL — ABNORMAL HIGH (ref 6–20)
CO2: 24 mmol/L (ref 22–32)
Calcium: 10.1 mg/dL (ref 8.9–10.3)
Chloride: 96 mmol/L — ABNORMAL LOW (ref 98–111)
Creatinine, Ser: 1.59 mg/dL — ABNORMAL HIGH (ref 0.44–1.00)
GFR, Estimated: 38 mL/min — ABNORMAL LOW (ref >60)
Glucose, Bld: 129 mg/dL — ABNORMAL HIGH (ref 70–99)
Potassium: 4.3 mmol/L (ref 3.5–5.1)
Sodium: 134 mmol/L — ABNORMAL LOW (ref 135–145)
Total Bilirubin: 2.3 mg/dL — ABNORMAL HIGH (ref 0.3–1.2)
Total Protein: 7.2 g/dL (ref 6.5–8.1)

## 2022-02-24 LAB — CBC WITH DIFFERENTIAL/PLATELET
Abs Immature Granulocytes: 0.06 10*3/uL (ref 0.00–0.07)
Basophils Absolute: 0 10*3/uL (ref 0.0–0.1)
Basophils Relative: 0 %
Eosinophils Absolute: 0.1 10*3/uL (ref 0.0–0.5)
Eosinophils Relative: 1 %
HCT: 40.6 % (ref 36.0–46.0)
Hemoglobin: 13.3 g/dL (ref 12.0–15.0)
Immature Granulocytes: 1 %
Lymphocytes Relative: 11 %
Lymphs Abs: 1 10*3/uL (ref 0.7–4.0)
MCH: 30 pg (ref 26.0–34.0)
MCHC: 32.8 g/dL (ref 30.0–36.0)
MCV: 91.4 fL (ref 80.0–100.0)
Monocytes Absolute: 1.4 10*3/uL — ABNORMAL HIGH (ref 0.1–1.0)
Monocytes Relative: 14 %
Neutro Abs: 7.1 10*3/uL (ref 1.7–7.7)
Neutrophils Relative %: 73 %
Platelets: 218 10*3/uL (ref 150–400)
RBC: 4.44 MIL/uL (ref 3.87–5.11)
RDW: 15.1 % (ref 11.5–15.5)
WBC: 9.6 10*3/uL (ref 4.0–10.5)
nRBC: 0 % (ref 0.0–0.2)

## 2022-02-24 LAB — I-STAT CHEM 8, ED
BUN: 30 mg/dL — ABNORMAL HIGH (ref 6–20)
Calcium, Ion: 1.14 mmol/L — ABNORMAL LOW (ref 1.15–1.40)
Chloride: 98 mmol/L (ref 98–111)
Creatinine, Ser: 1.7 mg/dL — ABNORMAL HIGH (ref 0.44–1.00)
Glucose, Bld: 128 mg/dL — ABNORMAL HIGH (ref 70–99)
HCT: 42 % (ref 36.0–46.0)
Hemoglobin: 14.3 g/dL (ref 12.0–15.0)
Potassium: 4.2 mmol/L (ref 3.5–5.1)
Sodium: 133 mmol/L — ABNORMAL LOW (ref 135–145)
TCO2: 23 mmol/L (ref 22–32)

## 2022-02-24 LAB — PROTIME-INR
INR: 1.5 — ABNORMAL HIGH (ref 0.8–1.2)
Prothrombin Time: 18.2 seconds — ABNORMAL HIGH (ref 11.4–15.2)

## 2022-02-24 LAB — APTT: aPTT: 32 seconds (ref 24–36)

## 2022-02-24 MED ORDER — TETANUS-DIPHTH-ACELL PERTUSSIS 5-2.5-18.5 LF-MCG/0.5 IM SUSY
0.5000 mL | PREFILLED_SYRINGE | Freq: Once | INTRAMUSCULAR | Status: AC
  Administered 2022-02-24: 0.5 mL via INTRAMUSCULAR
  Filled 2022-02-24: qty 0.5

## 2022-02-24 MED ORDER — ACETAMINOPHEN 325 MG PO TABS
650.0000 mg | ORAL_TABLET | Freq: Once | ORAL | Status: AC
  Administered 2022-02-24: 650 mg via ORAL
  Filled 2022-02-24: qty 2

## 2022-02-24 MED ORDER — LIDOCAINE 5 % EX PTCH
1.0000 | MEDICATED_PATCH | CUTANEOUS | 0 refills | Status: DC
Start: 2022-02-24 — End: 2022-11-15

## 2022-02-24 MED ORDER — BACITRACIN ZINC 500 UNIT/GM EX OINT
TOPICAL_OINTMENT | Freq: Two times a day (BID) | CUTANEOUS | Status: DC
  Administered 2022-02-24: 3 via TOPICAL
  Filled 2022-02-24: qty 2.7

## 2022-02-24 NOTE — Discharge Instructions (Addendum)
Your x-ray did show a rib fracture on the left which is likely where your pain is coming from.  Please use the breathing machine as discussed in the room.  May take Tylenol as needed for pain  Also written for lidocaine patches.  You have glue and Steri-Strips on the laceration on your face.  This will gently peel off over time.  Follow-up with primary care provider  Return for new or worsening symptoms

## 2022-02-24 NOTE — ED Notes (Signed)
Radiology at bedside

## 2022-02-24 NOTE — ED Provider Triage Note (Signed)
Emergency Medicine Provider Triage Evaluation Note  Ashley [name] , a 56 y.o. female  was evaluated in triage.  Pt complains of mechanical fall that occurred prior to arrival.  Was walking her son's dog when she fell forward onto her face.  Tried to break her fall with her outstretched hands.  Reports abrasions to her bilateral palms and knees.  Reports pain to her head and her face.  Is currently on Eliquis with last dose about 3 hours ago.  Review of Systems  Positive: Head injury, headache, facial pain, abrasions Negative: Numbness  Physical Exam  LMP 05/18/2015 Comment: went for 3 week after skipping two months  Gen:   Awake, no distress   Resp:  Normal effort  MSK:   Moves extremities without difficulty  Other:  Abrasions noted to nose, bilateral palms and knees  Medical Decision Making  Medically screening exam initiated at 9:07 PM.  Appropriate orders placed.  Toco was informed that the remainder of the evaluation will be completed by another provider, this initial triage assessment does not replace that evaluation, and the importance of remaining in the ED until their evaluation is complete.  Patient will be roomed as she is a level 2 trauma   Delia Heady, PA-C 02/24/22 2108

## 2022-02-24 NOTE — ED Notes (Signed)
Per pt she is on blood thinners Eliquis. Pt was walking a dog and the dog pulled her down. Provider at bedside.

## 2022-02-24 NOTE — ED Notes (Signed)
Abrasions to bilat knees, bilat palms of hand, and face top of forehead and nose. C/o pain to same areas.

## 2022-02-24 NOTE — ED Notes (Signed)
Returned from radiology at this time 

## 2022-02-24 NOTE — ED Provider Notes (Signed)
[facility] EMERGENCY DEPARTMENT Provider Note   CSN: [id number] Arrival date & time: 02/24/22  2039     History  Chief Complaint  Patient presents with   [place] is a 56 y.o. female history of A-fib on chronic anticoagulation here for evaluation after mechanical fall.  Was walking her son's dog when she is subsequently pulled to the ground.  She denies prior onset syncope, dizziness, numbness weakness or chest pain at that time.  States she hit her face as well as has pain to her bilateral hands and knees.  She has been ambulatory since the incident.  She is on Eliquis for her atrial fibrillation.  Level 2 trauma called from triage due to anticoagulation, fall on thinners.  Has multiple abrasions.  She denies headache, midline neck, back pain, chest pain, shortness of breath, abdominal pain, numbness or weakness.  HPI     Home Medications Prior to Admission medications   Medication Sig Start Date End Date Taking? Authorizing Provider  lidocaine (LIDODERM) 5 % Place 1 patch onto the skin daily. Remove & Discard patch within 12 hours or as directed by MD 02/24/22  Yes Jodelle Fausto A, PA-C  acetaminophen (TYLENOL) 500 MG tablet Take 500-1,000 mg by mouth every 6 (six) hours as needed for headache.    [provider]  amiodarone (PACERONE) 200 MG tablet Take 200 mg by mouth daily.    [provider]  apixaban (ELIQUIS) 5 MG TABS tablet Take 1 tablet (5 mg total) by mouth 2 (two) times daily. 11/01/21   [name], MD  Ascorbic Acid (VITAMIN C) 1000 MG tablet Take 1,000 mg by mouth 2 (two) times daily.     [provider]  atorvastatin (LIPITOR) 80 MG tablet Take 1 tablet (80 mg total) by mouth daily. 01/22/21   [name], MD  dapagliflozin propanediol (FARXIGA) 10 MG TABS tablet Take 1 tablet (10 mg total) by mouth daily before breakfast. 02/21/22   Bensimhon, Shaune Pascal, MD  diphenhydrAMINE (BENADRYL) 25 MG tablet  Take 25 mg by mouth every 6 (six) hours as needed for allergies.    [provider]  ENTRESTO 24-26 MG TAKE ONE TABLET BY MOUTH TWICE A DAY 02/21/22   Joette Catching, PA-C  famotidine (PEPCID) 20 MG tablet Take 20 mg by mouth daily as needed for indigestion.     [provider]  FARXIGA 10 MG TABS tablet TAKE ONE TABLET BY MOUTH ONE TIME DAILY 02/21/22   Joette Catching, PA-C  ferrous sulfate 325 (65 FE) MG tablet Take 1 tablet (325 mg total) by mouth daily with breakfast. 12/07/21   Shirley Friar, PA-C  furosemide (LASIX) 40 MG tablet Take 1 tablet (40 mg total) by mouth daily. Take extra tablet in PM as needed 02/07/22   Joette Catching, PA-C  Homeopathic Products (ARNICARE ARNICA EX) Apply 1 application. topically as needed (bruises).    [provider]  metoprolol succinate (TOPROL-XL) 100 MG 24 hr tablet Take 1 tablet (100 mg total) by mouth daily. Take with or immediately following a meal. 01/14/22   [name], MD  sacubitril-valsartan (ENTRESTO) 24-26 MG Take 1 tablet by mouth 2 (two) times daily. 02/21/22   Bensimhon, Shaune Pascal, MD  sodium chloride (OCEAN) 0.65 % SOLN nasal spray Place 1 spray into both nostrils daily as needed (for congestion).    [provider]  spironolactone (ALDACTONE) 25 MG tablet Take 1  tablet (25 mg total) by mouth daily. 01/27/22 04/27/22  Joette Catching, PA-C  vitamin B-12 (CYANOCOBALAMIN) 1000 MCG tablet Take 1 tablet (1,000 mcg total) by mouth daily. 06/21/16   Rosalin Hawking, MD      Allergies    Azithromycin, Latex, Spinach, and Tape    Review of Systems   Review of Systems  Constitutional: Negative.   HENT: Negative.    Respiratory: Negative.    Cardiovascular: Negative.   Gastrointestinal: Negative.   Genitourinary: Negative.   Skin:  Positive for wound.  Neurological: Negative.   All other systems reviewed and are negative.   Physical Exam Updated Vital Signs BP (!) 119/47   Pulse  (!) 57   Temp 99.4 F (37.4 C) (Oral)   Resp 17   Ht '5\' 8"'$  (1.727 m)   Wt 74.4 kg   LMP 05/18/2015 Comment: went for 3 week after skipping two months   SpO2 100%   BMI 24.94 kg/m  Physical Exam Vitals and nursing note reviewed.  Constitutional:      General: She is not in acute distress.    Appearance: She is well-developed. She is not ill-appearing, toxic-appearing or diaphoretic.  HENT:     Head: Normocephalic. Abrasion, contusion and laceration present. No raccoon eyes or Battle's sign.     Jaw: There is normal jaw occlusion.     Comments: Abrasions to distal tip nose, upper lip.  4 mm laceration mid nasal bridge.  No raccoon eye, battle sign    Nose: Signs of injury, laceration and nasal tenderness present.      Comments: Tenderness nasal bridge with overlying laceration.  No epistaxis, septal hematoma    Mouth/Throat:     Lips: Pink.     Mouth: Mucous membranes are moist.     Pharynx: Uvula midline.     Comments: Chipped front teeth.  No drooling, dysphagia or trismus.  Able to open and close jaw without difficulty, tongue midline.  No evidence of intraoral trauma Eyes:     Pupils: Pupils are equal, round, and reactive to light.     Comments: Pupils equal reactive to light No traumatic hyphema  Neck:     Trachea: Trachea and phonation normal.     Comments: No midline cervical tenderness or step-off Cardiovascular:     Rate and Rhythm: Normal rate.     Pulses: Normal pulses.          Radial pulses are 2+ on the right side and 2+ on the left side.       Dorsalis pedis pulses are 2+ on the right side and 2+ on the left side.     Heart sounds: Normal heart sounds.  Pulmonary:     Effort: Pulmonary effort is normal. No respiratory distress.     Breath sounds: Normal breath sounds and air entry.  Chest:     Comments: Non tender without crepitus or step off Abdominal:     General: Bowel sounds are normal. There is no distension.     Palpations: Abdomen is soft.      Tenderness: There is no abdominal tenderness.     Comments: Soft non tender  Musculoskeletal:        General: Normal range of motion.     Cervical back: Full passive range of motion without pain and normal range of motion.     Comments: Abrasions contusions bilateral palmar aspect hands.  She has abrasion posterior right humerus.  Loose bilateral arms up overhead without  difficulty.  No bony tenderness humerus, 4  Lymphadenopathy:     Cervical: No cervical adenopathy.  Skin:    General: Skin is warm and dry.  Neurological:     General: No focal deficit present.     Mental Status: She is alert.  Psychiatric:        Mood and Affect: Mood normal.    ED Results / Procedures / Treatments   Labs (all labs ordered are listed, but only abnormal results are displayed) Labs Reviewed  COMPREHENSIVE METABOLIC PANEL - Abnormal; Notable for the following components:      Result Value   Sodium 134 (*)    Chloride 96 (*)    Glucose, Bld 129 (*)    BUN 30 (*)    Creatinine, Ser 1.59 (*)    AST 60 (*)    ALT 121 (*)    Total Bilirubin 2.3 (*)    GFR, Estimated 38 (*)    All other components within normal limits  CBC WITH DIFFERENTIAL/PLATELET - Abnormal; Notable for the following components:   Monocytes Absolute 1.4 (*)    All other components within normal limits  PROTIME-INR - Abnormal; Notable for the following components:   Prothrombin Time 18.2 (*)    INR 1.5 (*)    All other components within normal limits  I-STAT CHEM 8, ED - Abnormal; Notable for the following components:   Sodium 133 (*)    BUN 30 (*)    Creatinine, Ser 1.70 (*)    Glucose, Bld 128 (*)    Calcium, Ion 1.14 (*)    All other components within normal limits  APTT    EKG EKG Interpretation  Date/Time:  Thursday February 24 2022 21:26:08 EDT Ventricular Rate:  60 PR Interval:  208 QRS Duration: 130 QT Interval:  480 QTC Calculation: 480 R Axis:   85 Text Interpretation: Sinus rhythm Borderline prolonged PR  interval Probable left atrial enlargement Nonspecific intraventricular conduction delay Abnormal T, consider ischemia, lateral leads No significant change since last tracing Confirmed by Isla Pence [phone]) on 02/24/2022 9:31:21 PM  Radiology DG Ribs Bilateral W/Chest  Result Date: 02/24/2022 CLINICAL DATA:  [id number] EXAM: BILATERAL RIBS AND CHEST - 4+ VIEW COMPARISON:  Chest x-ray 12/17/2021, CT chest 12/17/2021 FINDINGS: The heart and mediastinal contours are within normal limits. Left chest wall 2 lead pacemaker in similar position. Status post mitral valve replacement. No focal consolidation. No pulmonary edema. No pleural effusion. No pneumothorax. Query acute nondisplaced left sixth posterolateral rib fracture. Old healed left rib fractures. No acute displaced right rib fractures. Old healed nondisplaced right rib fracture. Sternotomy wires appear intact. IMPRESSION: 1. Query acute nondisplaced left sixth posterolateral rib fracture. Old healed left rib fractures. 2. No acute displaced right rib fracture. Please note, nondisplaced rib fractures may be occult on radiograph. 3. No acute cardiopulmonary abnormality. Electronically Signed   By: Iven Finn M.D.   On: 02/24/2022 23:08   CT HEAD WO CONTRAST (5MM)  Result Date: 02/24/2022 CLINICAL DATA:  Head trauma, moderate-severe; Facial trauma, blunt. Fall EXAM: CT HEAD WITHOUT CONTRAST CT MAXILLOFACIAL WITHOUT CONTRAST CT CERVICAL SPINE WITHOUT CONTRAST TECHNIQUE: Multidetector CT imaging of the head, cervical spine, and maxillofacial structures were performed using the standard protocol without intravenous contrast. Multiplanar CT image reconstructions of the cervical spine and maxillofacial structures were also generated. RADIATION DOSE REDUCTION: This exam was performed according to the departmental dose-optimization program which includes automated exposure control, adjustment of the mA and/or kV according to patient  size and/or use of iterative  reconstruction technique. COMPARISON:  MRI head 11/01/2015 FINDINGS: CT HEAD FINDINGS BRAIN: BRAIN Chronic right frontal encephalomalacia. No evidence of large-territorial acute infarction. No parenchymal hemorrhage. No mass lesion. No extra-axial collection. No mass effect or midline shift. No hydrocephalus. Basilar cisterns are patent. Vascular: No hyperdense vessel.  Insert Skull: No acute fracture or focal lesion. Other: None. CT MAXILLOFACIAL FINDINGS Osseous: No fracture or mandibular dislocation. No destructive process. Patient is edentulous. Sinuses/Orbits: Paranasal sinuses and mastoid air cells are clear. The orbits are unremarkable. Soft tissues: Negative. CT CERVICAL SPINE FINDINGS Alignment: Normal. Skull base and vertebrae: No acute fracture. No aggressive appearing focal osseous lesion or focal pathologic process. Soft tissues and spinal canal: No prevertebral fluid or swelling. No visible canal hematoma. Upper chest: Unremarkable. Other: Atherosclerotic plaque of the carotid arteries within the neck. IMPRESSION: 1. No acute intracranial abnormality. 2. No acute displaced facial fracture. 3. No acute displaced fracture or traumatic listhesis of the cervical spine. Electronically Signed   By: Iven Finn M.D.   On: 02/24/2022 22:43   CT Maxillofacial Wo Contrast  Result Date: 02/24/2022 CLINICAL DATA:  Head trauma, moderate-severe; Facial trauma, blunt. Fall EXAM: CT HEAD WITHOUT CONTRAST CT MAXILLOFACIAL WITHOUT CONTRAST CT CERVICAL SPINE WITHOUT CONTRAST TECHNIQUE: Multidetector CT imaging of the head, cervical spine, and maxillofacial structures were performed using the standard protocol without intravenous contrast. Multiplanar CT image reconstructions of the cervical spine and maxillofacial structures were also generated. RADIATION DOSE REDUCTION: This exam was performed according to the departmental dose-optimization program which includes automated exposure control, adjustment of the mA  and/or kV according to patient size and/or use of iterative reconstruction technique. COMPARISON:  MRI head 11/01/2015 FINDINGS: CT HEAD FINDINGS BRAIN: BRAIN Chronic right frontal encephalomalacia. No evidence of large-territorial acute infarction. No parenchymal hemorrhage. No mass lesion. No extra-axial collection. No mass effect or midline shift. No hydrocephalus. Basilar cisterns are patent. Vascular: No hyperdense vessel.  Insert Skull: No acute fracture or focal lesion. Other: None. CT MAXILLOFACIAL FINDINGS Osseous: No fracture or mandibular dislocation. No destructive process. Patient is edentulous. Sinuses/Orbits: Paranasal sinuses and mastoid air cells are clear. The orbits are unremarkable. Soft tissues: Negative. CT CERVICAL SPINE FINDINGS Alignment: Normal. Skull base and vertebrae: No acute fracture. No aggressive appearing focal osseous lesion or focal pathologic process. Soft tissues and spinal canal: No prevertebral fluid or swelling. No visible canal hematoma. Upper chest: Unremarkable. Other: Atherosclerotic plaque of the carotid arteries within the neck. IMPRESSION: 1. No acute intracranial abnormality. 2. No acute displaced facial fracture. 3. No acute displaced fracture or traumatic listhesis of the cervical spine. Electronically Signed   By: Iven Finn M.D.   On: 02/24/2022 22:43   CT Cervical Spine Wo Contrast  Result Date: 02/24/2022 CLINICAL DATA:  Head trauma, moderate-severe; Facial trauma, blunt. Fall EXAM: CT HEAD WITHOUT CONTRAST CT MAXILLOFACIAL WITHOUT CONTRAST CT CERVICAL SPINE WITHOUT CONTRAST TECHNIQUE: Multidetector CT imaging of the head, cervical spine, and maxillofacial structures were performed using the standard protocol without intravenous contrast. Multiplanar CT image reconstructions of the cervical spine and maxillofacial structures were also generated. RADIATION DOSE REDUCTION: This exam was performed according to the departmental dose-optimization program  which includes automated exposure control, adjustment of the mA and/or kV according to patient size and/or use of iterative reconstruction technique. COMPARISON:  MRI head 11/01/2015 FINDINGS: CT HEAD FINDINGS BRAIN: BRAIN Chronic right frontal encephalomalacia. No evidence of large-territorial acute infarction. No parenchymal hemorrhage. No mass lesion. No extra-axial collection. No  mass effect or midline shift. No hydrocephalus. Basilar cisterns are patent. Vascular: No hyperdense vessel.  Insert Skull: No acute fracture or focal lesion. Other: None. CT MAXILLOFACIAL FINDINGS Osseous: No fracture or mandibular dislocation. No destructive process. Patient is edentulous. Sinuses/Orbits: Paranasal sinuses and mastoid air cells are clear. The orbits are unremarkable. Soft tissues: Negative. CT CERVICAL SPINE FINDINGS Alignment: Normal. Skull base and vertebrae: No acute fracture. No aggressive appearing focal osseous lesion or focal pathologic process. Soft tissues and spinal canal: No prevertebral fluid or swelling. No visible canal hematoma. Upper chest: Unremarkable. Other: Atherosclerotic plaque of the carotid arteries within the neck. IMPRESSION: 1. No acute intracranial abnormality. 2. No acute displaced facial fracture. 3. No acute displaced fracture or traumatic listhesis of the cervical spine. Electronically Signed   By: Iven Finn M.D.   On: 02/24/2022 22:43   DG Knee Complete 4 Views Right  Result Date: 02/24/2022 CLINICAL DATA:  Status post fall. EXAM: RIGHT KNEE - COMPLETE 4+ VIEW COMPARISON:  None Available. FINDINGS: No evidence of fracture, dislocation, or joint effusion. No evidence of arthropathy or other focal bone abnormality. There is moderate severity vascular calcification. Soft tissues are otherwise unremarkable. IMPRESSION: 1. No acute osseous abnormality. Electronically Signed   By: Virgina Norfolk M.D.   On: 02/24/2022 22:11   DG Hand Complete Right  Result Date:  02/24/2022 CLINICAL DATA:  Status post fall. EXAM: RIGHT HAND - COMPLETE 3+ VIEW COMPARISON:  None Available. FINDINGS: There is no evidence of fracture or dislocation. There is no evidence of arthropathy or other focal bone abnormality. Soft tissues are unremarkable. IMPRESSION: Negative. Electronically Signed   By: Virgina Norfolk M.D.   On: 02/24/2022 22:10   DG Hand Complete Left  Result Date: 02/24/2022 CLINICAL DATA:  Status post fall. EXAM: LEFT HAND - COMPLETE 3+ VIEW COMPARISON:  None Available. FINDINGS: There is no evidence of fracture or dislocation. There is no evidence of arthropathy or other focal bone abnormality. Soft tissues are unremarkable. IMPRESSION: Negative. Electronically Signed   By: Virgina Norfolk M.D.   On: 02/24/2022 22:08   DG Knee Complete 4 Views Left  Result Date: 02/24/2022 CLINICAL DATA:  Status post trauma. EXAM: LEFT KNEE - COMPLETE 4+ VIEW COMPARISON:  None Available. FINDINGS: No evidence of fracture, dislocation, or joint effusion. No evidence of arthropathy or other focal bone abnormality. There is moderate severity vascular calcification. Soft tissues are otherwise unremarkable. IMPRESSION: 1. No acute osseous abnormality. Electronically Signed   By: Virgina Norfolk M.D.   On: 02/24/2022 22:07    Procedures .Marland KitchenLaceration Repair  Date/Time: 02/24/2022 11:08 PM  Performed by: Nettie Elm, PA-C Authorized by: Nettie Elm, PA-C   Consent:    Consent obtained:  Verbal   Consent given by:  Patient   Risks, benefits, and alternatives were discussed: yes     Risks discussed:  Infection, pain, retained foreign body, tendon damage, vascular damage, poor wound healing, poor cosmetic result, need for additional repair and nerve damage   Alternatives discussed:  No treatment, delayed treatment, observation and referral Universal protocol:    Procedure explained and questions answered to patient or proxy's satisfaction: yes     Relevant documents  present and verified: yes     Test results available: yes     Imaging studies available: yes     Required blood products, implants, devices, and special equipment available: yes     Site/side marked: yes     Immediately prior to procedure, a  time out was called: yes     Patient identity confirmed:  Verbally with patient Anesthesia:    Anesthesia method:  None Laceration details:    Location:  Face   Face location:  Nose   Length (cm):  0.5 Pre-procedure details:    Preparation:  Patient was prepped and draped in usual sterile fashion and imaging obtained to evaluate for foreign bodies Exploration:    Hemostasis achieved with:  Direct pressure   Imaging obtained comment:  CT   Imaging outcome: foreign body not noted     Wound exploration: wound explored through full range of motion and entire depth of wound visualized     Wound extent: no foreign bodies/material noted, no muscle damage noted, no tendon damage noted, no underlying fracture noted and no vascular damage noted   Treatment:    Area cleansed with:  Povidone-iodine   Amount of cleaning:  Extensive   Irrigation solution:  Sterile saline Skin repair:    Repair method:  Steri-Strips and tissue adhesive   Number of Steri-Strips:  1 Approximation:    Approximation:  Close Repair type:    Repair type:  Intermediate Post-procedure details:    Dressing:  Open (no dressing)   Procedure completion:  Tolerated well, no immediate complications     Medications Ordered in ED Medications  bacitracin ointment (has no administration in time range)  Tdap (BOOSTRIX) injection 0.5 mL (0.5 mLs Intramuscular Given 02/24/22 2135)  acetaminophen (TYLENOL) tablet 650 mg (650 mg Oral Given 02/24/22 2134)   ED Course/ Medical Decision Making/ A&P    56 year old anticoagulation for A-fib here for evaluation after mechanical fall.  She is multiple contusions and abrasions.  She is neurovascularly intact and ambulatory.  Does have laceration to  nasal bridge however no epistaxis.  She feels like she is at her baseline.  We will plan on cleaning out her wounds, Dermabond nasal bridge check some labs and reassess  Labs and imaging personally viewed and interpreted:  CBC without leukocytosis INR 1.5 Metabolic panel creatinine 1.53, elevated LFTs which appear to be at baseline CT head without significant abnormality CT max face without fracture CT cervical without fracture X-ray bilateral knees without acute fracture, dislocation X-ray bilateral hands without fracture, dislocation EKG without ischemic changes Chest x-ray with possible left rib fracture, given incentive spirometer   See procedure note. Dermabond to nasal bridge with steristrip.  Discussed results with patient in room.  She has no hypoxia.  Discussed incentive spirometer, deep breathing, Tylenol, lidocaine patches for pain.  She will return for any worsening symptoms.  Ambulatory here, tolerating p.o. intake.  The patient has been appropriately medically screened and/or stabilized in the ED. I have low suspicion for any other emergent medical condition which would require further screening, evaluation or treatment in the ED or require inpatient management.  Patient is hemodynamically stable and in no acute distress.  Patient able to ambulate in department prior to ED.  Evaluation does not show acute pathology that would require ongoing or additional emergent interventions while in the emergency department or further inpatient treatment.  I have discussed the diagnosis with the patient and answered all questions.  Pain is been managed while in the emergency department and patient has no further complaints prior to discharge.  Patient is comfortable with plan discussed in room and is stable for discharge at this time.  I have discussed strict return precautions for returning to the emergency department.  Patient was encouraged to follow-up  with PCP/specialist refer to at  discharge.                           Medical Decision Making Amount and/or Complexity of Data Reviewed Independent Historian: friend External Data Reviewed: labs, radiology, ECG and notes. Labs: ordered. Decision-making details documented in ED Course. Radiology: ordered and independent interpretation performed. Decision-making details documented in ED Course. ECG/medicine tests: ordered and independent interpretation performed. Decision-making details documented in ED Course.  Risk OTC drugs. Prescription drug management. Diagnosis or treatment significantly limited by social determinants of health.           Final Clinical Impression(s) / ED Diagnoses Final diagnoses:  Fall, initial encounter  Closed fracture of one rib of left side, initial encounter  Facial laceration, initial encounter  Chronic anticoagulation    Rx / DC Orders ED Discharge Orders          Ordered    lidocaine (LIDODERM) 5 %  Every 24 hours        02/24/22 2321              Anothy Bufano A, PA-C 02/24/22 2322    Isla Pence, MD 02/25/22 1605

## 2022-02-24 NOTE — ED Notes (Signed)
Provider at bedside

## 2022-02-24 NOTE — ED Triage Notes (Signed)
Pt fell forward while walking dog this evening. Takes blood thinners. Endorses head injury  and BUE. States arms were extended to guard fall.

## 2022-02-25 ENCOUNTER — Ambulatory Visit: Payer: Disability Insurance | Admitting: Cardiology

## 2022-02-28 ENCOUNTER — Other Ambulatory Visit: Payer: Self-pay | Admitting: Internal Medicine

## 2022-03-02 ENCOUNTER — Encounter (HOSPITAL_COMMUNITY): Payer: Disability Insurance | Admitting: Internal Medicine

## 2022-03-04 DIAGNOSIS — Z950 Presence of cardiac pacemaker: Secondary | ICD-10-CM | POA: Insufficient documentation

## 2022-03-04 DIAGNOSIS — I5022 Chronic systolic (congestive) heart failure: Secondary | ICD-10-CM | POA: Insufficient documentation

## 2022-03-07 ENCOUNTER — Encounter: Payer: Self-pay | Admitting: Internal Medicine

## 2022-03-07 ENCOUNTER — Ambulatory Visit (INDEPENDENT_AMBULATORY_CARE_PROVIDER_SITE_OTHER): Payer: Medicaid Other | Admitting: Internal Medicine

## 2022-03-07 VITALS — BP 110/74 | HR 60 | Ht 68.0 in | Wt 160.0 lb

## 2022-03-07 DIAGNOSIS — Z950 Presence of cardiac pacemaker: Secondary | ICD-10-CM

## 2022-03-07 DIAGNOSIS — E039 Hypothyroidism, unspecified: Secondary | ICD-10-CM | POA: Diagnosis not present

## 2022-03-07 DIAGNOSIS — I5022 Chronic systolic (congestive) heart failure: Secondary | ICD-10-CM | POA: Diagnosis not present

## 2022-03-07 DIAGNOSIS — I4891 Unspecified atrial fibrillation: Secondary | ICD-10-CM

## 2022-03-07 DIAGNOSIS — I495 Sick sinus syndrome: Secondary | ICD-10-CM | POA: Diagnosis not present

## 2022-03-07 DIAGNOSIS — Z79899 Other long term (current) drug therapy: Secondary | ICD-10-CM | POA: Diagnosis not present

## 2022-03-07 NOTE — Progress Notes (Signed)
Patient Care Team: [name], MD as PCP - General (Family Medicine) [name], MD as PCP - Cardiology (Cardiology) [name], MD as PCP - Electrophysiology (Cardiology) [name], MD as Consulting Physician (General Surgery)   HPI  CC atrial Ashley [name] is a 56 y.o. female seen in followup for atrial flutter/atrial fibrillation and sinus node dysfunction prompting pacemaker implantation 11/20 [facility].  Has ischemic heart disease with bypass surgery 2013 with mitral valve annuloplasty for also mitral valve prolapse  Recurrent tachypalpitations.  Discussions regarding antiarrhythmic therapy versus ablation chose dofetilide.  QT prolongation precluded its continuation and she was started on amiodarone.  Anticoagulations with Coumadin because of cost.  Prior stroke at an outside hospital treated tPA 2017  Recurrent atrial arrhythmias  admitted 4/23 with atrial tachycardia and rapid rate and underwent cardioversion following unsuccessful ATP.  Readmitted and treated with amiodarone for recurrent atrial fibrillation.  The patient denies chest pain, shortness of breath, nocturnal dyspnea, orthopnea or peripheral edema.  There have been no palpitations, lightheadedness or syncope.   No recurrent arrhythmias of which she is aware  Patient denies symptoms of respiratory, GI intolerance, sun sensitivity, neurological symptoms attributable to amiodarone.     [name]         DATE TEST EF    3/13 Echo   35-40 %    4/13 Echo  55-65%    3/20  Echo  30% LA 51//51  7/20 Echo  60-65% No LA measurement  1/23 Echo  50-55% LAE 51  mL/m Pressure half-time increased from 70--132 ms  4/23 Echo  30-35%   5/23 TEE 30-35% RV severely enlarged and mod dysfn PA pressure 58 TR mod MS-mild-moderate      Date Cr K Hgb TSH LFTs  3/20    11.7 (6/20) 1.6    9/20 0.84 4.0    19  12/20 0.9 3.9  1.83 (11/20) 17 (10/20)  2/21 1.01 4.7     7/22 0.96 4.3 12.7   1.12  26    Thromboembolic risk factors (, HTN-1, TIA/CVA-2, Vasc disease -1, CHF-1, Gender-1) for a CHADSVASc Score of >=6       Past Medical History:  Diagnosis Date   Abdominal pain    ? chronic cholecystitis; admxn to [facility] 9/12 (CT, USN, HIDA done)   Atrial fibrillation ([place])    Cardiomyopathy    echo 9/12: mild LVH, EF 35-40%, mod MR (difficult to judge /post leaflet restricted), mod BAE, mod RVE, mod TR    Cardiomyopathy ([place])    a. echo 9/12: mild LVH, EF 35-40%, mod MR (difficult to judge /post leaflet restricted), mod BAE, mod RVE, mod TR. b. Echo 11/2011: mild LVH, EF 55-60%, s/p MV repair w/o sig MR/MS, mildly dilated RV/RA, PASP 67mHg. // AFL dx 10/2018 - Echo 10/2018: EF 30, diff HK, s/p MV repair with trivial MR, mean MV 7 but not c/w significant mitral stenosis, mild RAE, severe LAE, mod reduced RVSF    Carotid artery disease ([place]    a. Duplex 06/2015: 50% RECA, 1-39% BICA.   CHF (congestive heart failure) (HCC)    Clotting disorder (HCC)    Coronary artery disease    a. s/p CABG 10/2011 - LIMA-LAD, SVG-PDA, SVG-OM, SVG-diagonal.   Dyslipidemia    Essential hypertension    Heart murmur    Hemifacial spasm    History of Doppler ultrasound    a. [facility] [place]  [place] [name] 7/[place]: no hemodynamically significant ICA stenosis   Hyperlipidemia    Mitral regurgitation    a. s/p MV repair 10/2011 - on Coumadin for 3 months afterwards then d/c'd by surgery.   Recurrent upper respiratory infection (URI)    S/P CABG x 4 10/21/2011   S/P mitral valve repair 10/21/2011   Stroke ([place]) 1989   a. age 59 - in setting of cigs and OCPs. //  b. s/p acute L parietal CVA >> tPA - [facility] in [place], [name]   Stroke [facility]) 01/2017   Tobacco abuse     Past Surgical History:  Procedure Laterality Date   CARDIAC CATHETERIZATION     CARDIOVERSION N/A 01/25/2019   Procedure: CARDIOVERSION;  Surgeon: [name], MD;  [facility] ENDOSCOPY;  Service:  Cardiovascular;  Laterality: N/A;   CARDIOVERSION N/A 06/14/2019   Procedure: CARDIOVERSION;  Surgeon: [name], MD;  Location: [facility] ENDOSCOPY;  Service: Cardiovascular;  Laterality: N/A;   [place]   twins   CORONARY ARTERY BYPASS GRAFT  10/21/2011   Procedure: CORONARY ARTERY BYPASS GRAFTING (CABG);  Surgeon: [name], MD;  Location: [place];  Service: Open Heart Surgery;  Laterality: N/A;  cabg x four, using right leg greater saphenous vein harvested endoscopically   DENTAL SURGERY     MITRAL VALVE REPAIR  10/21/2011   Procedure: MITRAL VALVE REPAIR (MVR);  Surgeon: [name], MD;  Location: [place];  Service: Open Heart Surgery;  Laterality: N/A;   PACEMAKER IMPLANT N/A 06/25/2019   Procedure: PACEMAKER IMPLANT;  Surgeon: [name], MD;  Location: [place] CV LAB;  Service: Cardiovascular;  Laterality: N/A;   TEE WITHOUT CARDIOVERSION  06/30/2011   Procedure: TRANSESOPHAGEAL ECHOCARDIOGRAM (TEE);  Surgeon: [name], MD;  [facility] END[facility]COPY;  Service: Cardiovascular;  Laterality: N/A;   TEE WITHOUT CARDIOVERSION N/A 01/19/2022   Procedure: TRANSESOPHAGEAL ECHOCARDIOGRAM (TEE);  Surgeon: Geralynn Rile, MD;  Location: [place];  Service: Cardiovascular;  Laterality: N/A;   TONSILLECTOMY     TUBAL LIGATION      Current Meds  Medication Sig   acetaminophen (TYLENOL) 500 MG tablet Take 500-1,000 mg by mouth every 6 (six) hours as needed for headache.   amiodarone (PACERONE) 200 MG tablet Take 1 tablet (200 mg total) by mouth daily.   apixaban (ELIQUIS) 5 MG TABS tablet Take 1 tablet (5 mg total) by mouth 2 (two) times daily.   Ascorbic Acid (VITAMIN C) 1000 MG tablet Take 1,000 mg by mouth 2 (two) times daily.    atorvastatin (LIPITOR) 80 MG tablet Take 1 tablet (80 mg total) by mouth daily.   dapagliflozin propanediol (FARXIGA) 10 MG TABS tablet Take 1 tablet (10 mg total) by mouth daily before breakfast.   diphenhydrAMINE (BENADRYL)  25 MG tablet Take 25 mg by mouth every 6 (six) hours as needed for allergies.   ENTRESTO 24-26 MG TAKE ONE TABLET BY MOUTH TWICE A DAY   famotidine (PEPCID) 20 MG tablet Take 20 mg by mouth daily as needed for indigestion.    FARXIGA 10 MG TABS tablet TAKE ONE TABLET BY MOUTH ONE TIME DAILY   ferrous sulfate 325 (65 FE) MG tablet Take 1 tablet (325 mg total) by mouth daily with breakfast.   furosemide (LASIX) 40 MG tablet Take 1 tablet (40 mg total) by mouth daily. Take extra tablet in PM as needed   Homeopathic Products (ARNICARE ARNICA EX) Apply 1 application. topically as  needed (bruises).   lidocaine (LIDODERM) 5 % Place 1 patch onto the skin daily. Remove & Discard patch within 12 hours or as directed by MD   metoprolol succinate (TOPROL-XL) 100 MG 24 hr tablet Take 1 tablet (100 mg total) by mouth daily. Take with or immediately following a meal.   sacubitril-valsartan (ENTRESTO) 24-26 MG Take 1 tablet by mouth 2 (two) times daily.   sodium chloride (OCEAN) 0.65 % SOLN nasal spray Place 1 spray into both nostrils daily as needed (for congestion).   spironolactone (ALDACTONE) 25 MG tablet Take 1 tablet (25 mg total) by mouth daily.   vitamin B-12 (CYANOCOBALAMIN) 1000 MCG tablet Take 1 tablet (1,000 mcg total) by mouth daily.   Current Facility-Administered Medications for the 03/07/22 encounter (Office Visit) with [name], MD  Medication   incobotulinumtoxinA (XEOMIN) 50 units injection 50 Units   incobotulinumtoxinA (XEOMIN) 50 units injection 50 Units   incobotulinumtoxinA (XEOMIN) 50 units injection 50 Units    Allergies  Allergen Reactions   Azithromycin Nausea Only   Latex Rash   Spinach Hives    Cooked    Tape Dermatitis and Other (See Comments)    PLEASE USE PAPER TAPE!! AFFECTS SKIN BADLY!!!!      Review of Systems negative except from HPI and PMH  Physical Exam BP 110/74   Pulse 60   Ht '5\' 8"'$  (1.727 m)   Wt 160 lb (72.6 kg)   LMP 05/18/2015 Comment: went  for 3 week after skipping two months   SpO2 98%   BMI 24.33 kg/m  Well developed and well nourished in no acute distress HENT normal Neck supple with JVP-flat Clear Device pocket well healed; without hematoma or erythema.  There is no tethering  Regular rate and rhythm, no  gallop No murmur Abd-soft with active BS No Clubbing cyanosis  edema Skin-warm and dry A & Oriented  Grossly normal sensory and motor function  ECG atrial pacing at 60 Intervals 24/13/46 Atypical right bundle branch block PVCs   Assessment and  Plan Atrial fibrillation/flutter with a controlled ventricular rate  Atrial tachycardia  Hypertension  Mitral valve prolapse--repair/CABG iatrogenic mitral stenosis - Left atriopathy-severe  Obesity  Right ptosis-chronic   Cardiomyopathy-nonischemic question mechanism possibly rate related  Sinus node dysfunction  High Risk Medication Surveillance AMIODARONE  PVCs  Pacemaker St Jude     Presented with atrial tachycardia now on back on amiodarone seemingly effective.  Heart rate histograms do not demonstrate a atrial tachycardia spike.  They do demonstrate some PVCs but only about 2 or 3%.  She was thought to have progressive mitral stenosis based on pressure half-time assessment from her most recent echo.  I have reached out to Dr. Johney Frame and  she has agreed to help care for Mrs. Fraizer and valvular heart disease.  New onset cardiomyopathy with biventricular involvement.  Hopefully rate related and will improve with control of rate.  On amiodarone.  Tolerating.  Need amiodarone surveillance laboratories.    Current medicines are reviewed at length with the patient today .  The patient does not  have concerns regarding medicines.

## 2022-03-07 NOTE — Patient Instructions (Signed)
Medication Instructions:  Your physician recommends that you continue on your current medications as directed. Please refer to the Current Medication list given to you today.  *If you need a refill on your cardiac medications before your next appointment, please call your pharmacy*   Lab Work: TSH If you have labs (blood work) drawn today and your tests are completely normal, you will receive your results only by: [place] (if you have MyChart) OR A paper copy in the mail If you have any lab test that is abnormal or we need to change your treatment, we will call you to review the results.   Testing/Procedures: None ordered.    Follow-Up: At [facility], you and your health needs are our priority.  As part of our continuing mission to provide you with exceptional heart care, we have created designated Provider Care Teams.  These Care Teams include your primary Cardiologist (physician) and Advanced Practice Providers (APPs -  Physician Assistants and Nurse Practitioners) who all work together to provide you with the care you need, when you need it.  We recommend signing up for the patient portal called "MyChart".  Sign up information is provided on this After Visit Summary.  MyChart is used to connect with patients for Virtual Visits (Telemedicine).  Patients are able to view lab/test results, encounter notes, upcoming appointments, etc.  Non-urgent messages can be sent to your provider as well.   To learn more about what you can do with MyChart, go to [email or web address].    Your next appointment:   6 months with Dr Caryl Comes  Important Information About Sugar

## 2022-03-21 ENCOUNTER — Other Ambulatory Visit (HOSPITAL_COMMUNITY): Payer: Self-pay | Admitting: Physician Assistant

## 2022-03-22 ENCOUNTER — Ambulatory Visit (INDEPENDENT_AMBULATORY_CARE_PROVIDER_SITE_OTHER): Payer: Medicaid Other

## 2022-03-22 DIAGNOSIS — I495 Sick sinus syndrome: Secondary | ICD-10-CM | POA: Diagnosis not present

## 2022-03-22 LAB — CUP PACEART REMOTE DEVICE CHECK
Battery Remaining Longevity: 88 mo
Battery Remaining Percentage: 76 %
Battery Voltage: 3.01 V
Brady Statistic AP VP Percent: 1.8 %
Brady Statistic AP VS Percent: 97 %
Brady Statistic AS VP Percent: 1 %
Brady Statistic AS VS Percent: 1 %
Brady Statistic RA Percent Paced: 97 %
Brady Statistic RV Percent Paced: 2 %
Date Time Interrogation Session: 20230801020016
Implantable Lead Implant Date: 20201103
Implantable Lead Implant Date: 20201103
Implantable Lead Location: 753859
Implantable Lead Location: 753860
Implantable Pulse Generator Implant Date: 20201103
Lead Channel Impedance Value: 490 Ohm
Lead Channel Impedance Value: 650 Ohm
Lead Channel Pacing Threshold Amplitude: 0.75 V
Lead Channel Pacing Threshold Amplitude: 1.125 V
Lead Channel Pacing Threshold Pulse Width: 0.5 ms
Lead Channel Pacing Threshold Pulse Width: 0.5 ms
Lead Channel Sensing Intrinsic Amplitude: 5 mV
Lead Channel Sensing Intrinsic Amplitude: 9.5 mV
Lead Channel Setting Pacing Amplitude: 1 V
Lead Channel Setting Pacing Amplitude: 2.5 V
Lead Channel Setting Pacing Pulse Width: 0.5 ms
Lead Channel Setting Sensing Sensitivity: 2 mV
Pulse Gen Model: 2272
Pulse Gen Serial Number: 9174760

## 2022-03-25 ENCOUNTER — Other Ambulatory Visit (HOSPITAL_COMMUNITY): Payer: Self-pay | Admitting: Physician Assistant

## 2022-03-29 NOTE — Progress Notes (Unsigned)
Cardiology Office Note:    Date:  03/29/2022   ID:  [place], DOB 06-Aug-1966, MRN [id number]  PCP:  [name], [place] Providers Cardiologist:  Dorris Carnes, MD Electrophysiologist:  Virl Axe, MD {    Referring MD: [name], MD    History of Present Illness:    Ashley [name] is a 56 y.o. female with a hx of with history of CAD s/p CABG in 2013, MVP with severe MR s/p MV repair at time of CABG, chronic systolic CHF, sinus node dysfunction s/p St. Jude pacer 11/20, Hx CVA 2017 treated with tPA, atrial flutter/fibrillation, HTN, HLD, and tobacco use who was referred by Dr. Margarita Rana for follow-up of her CAD, systolic heart failure, and MVR.  Per review of the record, the patient was found to be in atrial flutter with RVR 10/2018. EF down to 30% on echo. Underwent DCCV 01/2019. She had recurrence of AF but EF had improved to 60-65% on subsequent echo 07/20. Repeat TTE 01/23 with LVEF 50-55%. Has been on amiodarone for atrial arrhythmias.   Patient was admitted on 04/23 with AT with RVR and volume overload. Attempts to pace out were unsuccessful and she underwent DCCV. She was readmitted about 10 days later with recurrent AF and volume overload. She was placed on amiodarone drip and converted to SR. Diuresed with IV lasix. Transitioned to P.O. lasxi 40 mg BID at discharge. Echo: EF 30-35%, RV moderately reduced, RVSP 58 mmHg, severe LAE, posterior leaflet mitral valve appears severely restricted, moderate to severe mitral stenosis with mean gradient 9 mmHg and moderate MR ( may not be fully visualized, thick CW doppler jet), severe TR.  Was seen by Dr. Haroldine Laws 12/2021 Was scheduled for TEE/DCCV 01/19/22.  Had follow-up with Dr. Caryl Comes in 02/2022 where she was stable from a CV standpoint.  Today, ***  Past Medical History:  Diagnosis Date   Abdominal pain    ? chronic cholecystitis; admxn to [facility] 9/12 (CT, USN, HIDA done)   Atrial  fibrillation ([place])    Cardiomyopathy    echo 9/12: mild LVH, EF 35-40%, mod MR (difficult to judge /post leaflet restricted), mod BAE, mod RVE, mod TR    Cardiomyopathy ([place])    a. echo 9/12: mild LVH, EF 35-40%, mod MR (difficult to judge /post leaflet restricted), mod BAE, mod RVE, mod TR. b. Echo 11/2011: mild LVH, EF 55-60%, s/p MV repair w/o sig MR/MS, mildly dilated RV/RA, PASP 67mHg. // AFL dx 10/2018 - Echo 10/2018: EF 30, diff HK, s/p MV repair with trivial MR, mean MV 7 but not c/w significant mitral stenosis, mild RAE, severe LAE, mod reduced RVSF    Carotid artery disease ([place]    a. Duplex 06/2015: 50% RECA, 1-39% BICA.   CHF (congestive heart failure) (HCC)    Clotting disorder (HCC)    Coronary artery disease    a. s/p CABG 10/2011 - LIMA-LAD, SVG-PDA, SVG-OM, SVG-diagonal.   Dyslipidemia    Essential hypertension    Heart murmur    Hemifacial spasm    History of Doppler ultrasound    a. [facility] [place] [place]: no hemodynamically significant ICA stenosis   Hyperlipidemia    Mitral regurgitation    a. s/p MV repair 10/2011 - on Coumadin for 3 months afterwards then d/c'd by surgery.   Recurrent upper respiratory infection (URI)    S/P CABG x 4 10/21/2011   S/P mitral valve repair 10/21/2011  Stroke [facility]) 1989   a. age 18 - in setting of cigs and OCPs. //  b. s/p acute L parietal CVA >> tPA - [facility] in [place], [name]   Stroke [facility]) 01/2017   Tobacco abuse     Past Surgical History:  Procedure Laterality Date   CARDIAC CATHETERIZATION     CARDIOVERSION N/A 01/25/2019   Procedure: CARDIOVERSION;  Surgeon: [name], MD;  Location: [place];  Service: Cardiovascular;  Laterality: N/A;   CARDIOVERSION N/A 06/14/2019   Procedure: CARDIOVERSION;  Surgeon: [name], MD;  Location: [place];  Service: Cardiovascular;  Laterality: N/A;   [place]   twins   CORONARY ARTERY BYPASS GRAFT  10/21/2011    Procedure: CORONARY ARTERY BYPASS GRAFTING (CABG);  Surgeon: [name], MD;  Location: [place];  Service: Open Heart Surgery;  Laterality: N/A;  cabg x four, using right leg greater saphenous vein harvested endoscopically   DENTAL SURGERY     MITRAL VALVE REPAIR  10/21/2011   Procedure: MITRAL VALVE REPAIR (MVR);  Surgeon: [name], MD;  Location: [place];  Service: Open Heart Surgery;  Laterality: N/A;   PACEMAKER IMPLANT N/A 06/25/2019   Procedure: PACEMAKER IMPLANT;  Surgeon: [name], MD;  Location: [place] CV LAB;  Service: Cardiovascular;  Laterality: N/A;   TEE WITHOUT CARDIOVERSION  06/30/2011   Procedure: TRANSESOPHAGEAL ECHOCARDIOGRAM (TEE);  Surgeon: [name], MD;  Location: [facility] ENDOSCOPY;  Service: Cardiovascular;  Laterality: N/A;   TEE WITHOUT CARDIOVERSION N/A 01/19/2022   Procedure: TRANSESOPHAGEAL ECHOCARDIOGRAM (TEE);  Surgeon: Geralynn Rile, MD;  Location: [place];  Service: Cardiovascular;  Laterality: N/A;   TONSILLECTOMY     TUBAL LIGATION      Current Medications: No outpatient medications have been marked as taking for the 04/04/22 encounter (Appointment) with Freada Bergeron, MD.   Current Facility-Administered Medications for the 04/04/22 encounter (Appointment) with Freada Bergeron, MD  Medication   incobotulinumtoxinA (XEOMIN) 50 units injection 50 Units   incobotulinumtoxinA (XEOMIN) 50 units injection 50 Units   incobotulinumtoxinA (XEOMIN) 50 units injection 50 Units     Allergies:   Azithromycin, Latex, Spinach, and Tape   Social History   Socioeconomic History   Marital status: Widowed    Spouse name: Not on file   Number of children: 2   Years of education: Not on file   Highest education level: Bachelor's degree (e.g., BA, AB, BS)  Occupational History   Occupation: Payroll/Paperwork    Comment: 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   Occupation: Disability    Comment: since march 2023  Tobacco Use    Smoking status: Former    Packs/day: 1.00    Years: 30.00    Total pack years: 30.00    Types: Cigarettes    Quit date: 05/07/2011    Years since quitting: 10.9   Smokeless tobacco: Never  Vaping Use   Vaping Use: Never used  Substance and Sexual Activity   Alcohol use: Yes    Alcohol/week: 1.0 standard drink of alcohol    Types: 1 Cans of beer per week    Comment: can of beer 1 x week   Drug use: No   Sexual activity: Yes  Other Topics Concern   Not on file  Social History Narrative   Lives at home with husband and son.   Right-handed.   2 cups caffeine daily.   Social Determinants of Radio broadcast assistant  Strain: Not on file  Food Insecurity: No Food Insecurity (12/21/2021)   Hunger Vital Sign    Worried About Running Out of Food in the Last Year: Never true    Ran Out of Food in the Last Year: Never true  Transportation Needs: No Transportation Needs (12/21/2021)   PRAPARE - Hydrologist (Medical): No    Lack of Transportation (Non-Medical): No  Physical Activity: Not on file  Stress: Not on file  Social Connections: Not on file     Family History: The patient's ***family history includes Bone cancer in her maternal grandmother; COPD in her father; Heart disease in her father; Hypertension in her mother; Stroke in her mother. There is no history of Anesthesia problems, Hypotension, Malignant hyperthermia, Pseudochol deficiency, or Colon cancer.  ROS:   Please see the history of present illness.    *** All other systems reviewed and are negative.  EKGs/Labs/Other Studies Reviewed:    The following studies were reviewed today: TEE Jan 19, 2022: IMPRESSIONS     1. 26 mm annuloplasty ring. MG 4.0 mmHG @ 61 bpm. Mild to moderate mitral  stenosis is present likely related to the small annuloplasty ring.  Leaflets are mobile. MVA by directly 3D planimetry 1.71 cm2. MVA by VTI  2.17 cm2. Moderate mitral regurgitation  is present likely  related to restricted movement of the PMVL. 3D VCA 0.21  cm2. The mitral valve has been repaired/replaced. Moderate mitral valve  regurgitation. Mild to moderate mitral stenosis. There is a 26 mm RING  MITRAL MEMO 3D 26MM prosthetic  annuloplasty ring present in the mitral position. Procedure Date:  10/21/2011.   2. Left ventricular ejection fraction, by estimation, is 30 to 35%. The  left ventricle has moderately decreased function. The left ventricle  demonstrates global hypokinesis. The left ventricular internal cavity size  was mildly dilated.   3. Right ventricular systolic function is moderately reduced. The right  ventricular size is severely enlarged. There is moderately elevated  pulmonary artery systolic pressure.   4. Left atrial size was severely dilated. No left atrial/left atrial  appendage thrombus was detected. The LAA emptying velocity was 23 cm/s.   5. Right atrial size was severely dilated.   6. Moderate tricuspid regurgitation. 3D VCA 0.27 cm2. Tricuspid valve  regurgitation is moderate.   7. The aortic valve is tricuspid. Aortic valve regurgitation is not  visualized. No aortic stenosis is present.   8. There is Moderate (Grade III) layered plaque involving the descending  aorta.  EKG:  EKG is *** ordered today.  The ekg ordered today demonstrates ***  Recent Labs: 11/01/2021: TSH 1.860 01/27/2022: B Natriuretic Peptide 1,132.5 02/24/2022: ALT 121; BUN 30; Creatinine, Ser 1.70; Hemoglobin 14.3; Platelets 218; Potassium 4.2; Sodium 133  Recent Lipid Panel    Component Value Date/Time   CHOL 114 11/16/2017 0838   TRIG 68 11/16/2017 0838   HDL 43 11/16/2017 0838   CHOLHDL 2.7 11/16/2017 0838   CHOLHDL 3.8 07/01/2016 0939   VLDL 25 07/01/2016 0939   LDLCALC 57 11/16/2017 0838     Risk Assessment/Calculations:   {Does this patient have ATRIAL FIBRILLATION?[phone]}       Physical Exam:    VS:  LMP 05/18/2015 Comment: went for 3 week after skipping two  months     Wt Readings from Last 3 Encounters:  03/07/22 160 lb (72.6 kg)  02/24/22 164 lb (74.4 kg)  01/27/22 170 lb (77.1 kg)     GEN: ***  Well nourished, well developed in no acute distress HEENT: Normal NECK: No JVD; No carotid bruits LYMPHATICS: No lymphadenopathy CARDIAC: ***RRR, no murmurs, rubs, gallops RESPIRATORY:  Clear to auscultation without rales, wheezing or rhonchi  ABDOMEN: Soft, non-tender, non-distended MUSCULOSKELETAL:  No edema; No deformity  SKIN: Warm and dry NEUROLOGIC:  Alert and oriented x 3 PSYCHIATRIC:  Normal affect   ASSESSMENT:    No diagnosis found. PLAN:    In order of problems listed above:  #Chronic systolic CHF: -Patient with fluctuating EF. TTE 11/2011 with LVEF 55-60%. Dropped to 30% in 2020 in setting of Aflutter with RVR. Recovered to 60-65% in 02/2019. Dropped again to 30-35% in 11/2021 in the setting of atrial arrhythmias.  -Etiology not certain. ? D/t progressive valvular disease +/- tachymediated in setting of atrial arrhythmias. EF has been variable over the years, has previously recovered with restoration of SR. Reviewed her most recent echo with Dr. Haroldine Laws. Appears to have severe MR and possibly severely mitral stenosis.  -Continue lasix '40mg'$  daily -Continue Metoprolol XL 100 mg daily -Continue Entresto 24/26 mg BID -Continue Farxiga 10 mg daily -Add spiro 12.5 mg daily -BMET, BNP, CBC today. BMET again in 1 week.   #CAD s/p CABG: -S/p CABG (LIMA to LAD, SVG to PDA, SVG to OM, SVG to diagonal) in 2013 -No aspirin with anticoagulation -Continue statin -Denies angina   #S/p mitral valve repair with mild-to-moderate MS/moderate MR: -TEE 01/19/22 with mild-to-moderate MS with mean gradient 5mHg with MVA by 3D 1.71cm2. Leaflets are mobile and likely narrowing due to small annuloplasty ring. There is moderate MR with restricted movement of the PMVL  #Tricuspid regurgitation: -Moderate TR on TEE in 12/2021. 3D VC 0.27cm2.    #Atrial arrhythmias: -Maintaining SR on ECG today -Continue amiodarone 200 mg daily per EP -Anticoagulated with Eliquis 5 mg BID. May need to consider warfarin for anticoagulation if TEE confirms significant mitral stenosis   #Tobacco use: -Quit smoking  04/23   #HTN:  -BP stable -GDMT as above   #HLD:  -On high-intensity statin       {Are you ordering a CV Procedure (e.g. stress test, cath, DCCV, TEE, etc)?   Press F2        :[id number]}   Medication Adjustments/Labs and Tests Ordered: Current medicines are reviewed at length with the patient today.  Concerns regarding medicines are outlined above.  No orders of the defined types were placed in this encounter.  No orders of the defined types were placed in this encounter.   There are no Patient Instructions on file for this visit.   Signed, HFreada Bergeron MD  03/29/2022 2:44 PM    [place]

## 2022-04-04 ENCOUNTER — Encounter: Payer: Self-pay | Admitting: Cardiology

## 2022-04-04 ENCOUNTER — Other Ambulatory Visit: Payer: Disability Insurance

## 2022-04-04 ENCOUNTER — Other Ambulatory Visit (HOSPITAL_COMMUNITY): Payer: Disability Insurance

## 2022-04-04 ENCOUNTER — Ambulatory Visit (INDEPENDENT_AMBULATORY_CARE_PROVIDER_SITE_OTHER): Payer: Medicaid Other | Admitting: Cardiology

## 2022-04-04 VITALS — BP 120/62 | HR 60 | Ht 68.0 in | Wt 160.2 lb

## 2022-04-04 DIAGNOSIS — I5022 Chronic systolic (congestive) heart failure: Secondary | ICD-10-CM

## 2022-04-04 DIAGNOSIS — Z79899 Other long term (current) drug therapy: Secondary | ICD-10-CM

## 2022-04-04 DIAGNOSIS — E039 Hypothyroidism, unspecified: Secondary | ICD-10-CM

## 2022-04-04 DIAGNOSIS — I10 Essential (primary) hypertension: Secondary | ICD-10-CM

## 2022-04-04 DIAGNOSIS — I480 Paroxysmal atrial fibrillation: Secondary | ICD-10-CM

## 2022-04-04 DIAGNOSIS — I2510 Atherosclerotic heart disease of native coronary artery without angina pectoris: Secondary | ICD-10-CM

## 2022-04-04 DIAGNOSIS — Z950 Presence of cardiac pacemaker: Secondary | ICD-10-CM | POA: Diagnosis not present

## 2022-04-04 DIAGNOSIS — Z9889 Other specified postprocedural states: Secondary | ICD-10-CM | POA: Diagnosis not present

## 2022-04-04 DIAGNOSIS — I498 Other specified cardiac arrhythmias: Secondary | ICD-10-CM

## 2022-04-04 DIAGNOSIS — I495 Sick sinus syndrome: Secondary | ICD-10-CM

## 2022-04-04 DIAGNOSIS — I361 Nonrheumatic tricuspid (valve) insufficiency: Secondary | ICD-10-CM

## 2022-04-04 DIAGNOSIS — I4891 Unspecified atrial fibrillation: Secondary | ICD-10-CM

## 2022-04-04 LAB — BASIC METABOLIC PANEL
BUN/Creatinine Ratio: 18 (ref 9–23)
BUN: 30 mg/dL — ABNORMAL HIGH (ref 6–24)
CO2: 25 mmol/L (ref 20–29)
Calcium: 9.8 mg/dL (ref 8.7–10.2)
Chloride: 101 mmol/L (ref 96–106)
Creatinine, Ser: 1.65 mg/dL — ABNORMAL HIGH (ref 0.57–1.00)
Glucose: 105 mg/dL — ABNORMAL HIGH (ref 70–99)
Potassium: 5 mmol/L (ref 3.5–5.2)
Sodium: 138 mmol/L (ref 134–144)
eGFR: 36 mL/min/{1.73_m2} — ABNORMAL LOW (ref >59)

## 2022-04-04 LAB — TSH: TSH: <0.005 u[IU]/mL — ABNORMAL LOW (ref 0.450–4.500)

## 2022-04-04 NOTE — Patient Instructions (Signed)
Medication Instructions:   Your physician recommends that you continue on your current medications as directed. Please refer to the Current Medication list given to you today.  *If you need a refill on your cardiac medications before your next appointment, please call your pharmacy*   Follow-Up:  4 MONTHS WITH DR. Johney Frame IN THE OFFICE    Important Information About Sugar

## 2022-04-05 ENCOUNTER — Other Ambulatory Visit (HOSPITAL_COMMUNITY): Payer: Disability Insurance

## 2022-04-05 ENCOUNTER — Telehealth: Payer: Self-pay | Admitting: Family Medicine

## 2022-04-05 NOTE — Telephone Encounter (Signed)
..   Medicaid Managed Care   Unsuccessful Outreach Note  04/05/2022 Name: Ashley [name] MRN: [id number] DOB: [birth year]  Referred by: [name], MD Reason for referral : High Risk Managed Medicaid (I called the patient today to get her scheduled with the MM Team. I left my name and number on her VM.)   An unsuccessful telephone outreach was attempted today. The patient was referred to the case management team for assistance with care management and care coordination.   Follow Up Plan: The care management team will reach out to the patient again over the next 14 days.    [place]

## 2022-04-12 ENCOUNTER — Ambulatory Visit: Payer: Disability Insurance | Admitting: Acute Care

## 2022-04-13 ENCOUNTER — Other Ambulatory Visit: Payer: Self-pay | Admitting: Internal Medicine

## 2022-04-14 ENCOUNTER — Telehealth: Payer: Self-pay

## 2022-04-14 ENCOUNTER — Other Ambulatory Visit: Payer: Self-pay | Admitting: *Deleted

## 2022-04-14 DIAGNOSIS — E0580 Other thyrotoxicosis without thyrotoxic crisis or storm: Secondary | ICD-10-CM

## 2022-04-14 MED ORDER — FERROUS SULFATE 325 (65 FE) MG PO TABS: 325.0000 mg | ORAL_TABLET | Freq: Every day | ORAL | 3 refills | Status: DC

## 2022-04-14 MED ORDER — PREDNISONE 20 MG PO TABS: ORAL_TABLET | ORAL | 0 refills | Status: AC

## 2022-04-14 NOTE — Telephone Encounter (Signed)
Spoke with pt and advised of TSH results and Dr Olin Pia recommendations as below.  Pt verbalizes understanding of all instructions and agrees with current plan.

## 2022-04-14 NOTE — Telephone Encounter (Signed)
-----   Message from [name], MD sent at 04/09/2022 11:07 AM EDT ----- Please Inform Patient that she is now HYPERTHYROID on amio I  would like to do 3 things 1) prednisone 20 mg daily x 2 weeks 10 mg daily x 2weeks 2) stop her amio  and we will need to have her come in [place]/me/AFib clinic to discuss again ablation 3) recheck TSH in 4 weeks Thanks

## 2022-04-15 ENCOUNTER — Telehealth: Payer: Self-pay | Admitting: Internal Medicine

## 2022-04-15 NOTE — Telephone Encounter (Signed)
Patient states that if she start going to afib clinic, more then usually what she needs to do.please advise

## 2022-04-15 NOTE — Telephone Encounter (Signed)
Returned call to patient. She is concerned now that she is off Amiodarone and may go back into afib, what should she do if that happens.  Advised patient to call if she feel palpitations, SOB or milder symptoms. If she experiences CP, SOB or strong palpitations she should to to the ED for evaluation.  Patient verbalized understanding of instructions.

## 2022-04-19 ENCOUNTER — Ambulatory Visit
Admission: RE | Admit: 2022-04-19 | Discharge: 2022-04-19 | Disposition: A | Discharge status: Home / Self Care | Payer: Medicaid Other | Source: Ambulatory Visit | Attending: Pulmonary Disease | Admitting: Pulmonary Disease

## 2022-04-19 DIAGNOSIS — R911 Solitary pulmonary nodule: Secondary | ICD-10-CM

## 2022-04-20 NOTE — Telephone Encounter (Signed)
Please Inform Patient The idea of meeting was to discuss strategy alternatives to amiodarone including ablatin Thanks

## 2022-04-20 NOTE — Progress Notes (Signed)
Remote pacemaker transmission.   

## 2022-04-21 ENCOUNTER — Telehealth: Payer: Self-pay | Admitting: Cardiology

## 2022-04-21 ENCOUNTER — Other Ambulatory Visit: Payer: Self-pay | Admitting: *Deleted

## 2022-04-21 NOTE — Patient Instructions (Signed)
Visit Information  Ms. [place]  - as a part of your Medicaid benefit, you are eligible for care management and care coordination services at no cost or copay. I was unable to reach you by phone today but would be happy to help you with your health related needs. Please feel free to call me @ [phone].   A member of the Managed Medicaid care management team will reach out to you again over the next 14 days.   Lurena Joiner RN, BSN [place]  [employer] manager

## 2022-04-21 NOTE — Patient Outreach (Signed)
  Medicaid Managed Care   Unsuccessful Attempt Note   04/21/2022 Name: Ashley [name] MRN: [id number] DOB: 1966-02-08  Referred by: [name], MD Reason for referral : High Risk Managed Medicaid (Unsuccessful RNCM initial telephone outreach)   An unsuccessful telephone outreach was attempted today. The patient was referred to the case management team for assistance with care management and care coordination.    Follow Up Plan: A HIPAA compliant phone message was left for the patient providing contact information and requesting a return call.    Lurena Joiner RN, BSN [place]  [employer] manager

## 2022-04-21 NOTE — Telephone Encounter (Signed)
Pt c/o medication issue:  1. Name of Medication:   predniSONE (DELTASONE) 20 MG tablet  2. How are you currently taking this medication (dosage and times per day)?  As prescribed  3. Are you having a reaction (difficulty breathing--STAT)?  No  4. What is your medication issue?   Patient called wanting to know why she has been prescribed this medication and she believes this is causing her weight gain.    Patient stated she put on 6 lbs in a week.  Patient does not keep a log of her daily weights.  Patient is taking a fluid pill (Lasix) but states she does not have any swelling and no SOB.

## 2022-04-21 NOTE — Telephone Encounter (Signed)
Called patient to let her know that Dr. Caryl Comes has her on this prednisone due to her hyperthyroid from the amiodarone. Informed patient that since we have stopped the amiodarone her levels of iodine will go down and the prednisone is going to keep her from crashing and hopefully even everything out during the 4 week taper. Patient verbalized understanding.

## 2022-04-26 ENCOUNTER — Telehealth: Payer: Self-pay | Admitting: Family Medicine

## 2022-04-26 ENCOUNTER — Ambulatory Visit (INDEPENDENT_AMBULATORY_CARE_PROVIDER_SITE_OTHER): Payer: Medicaid Other | Admitting: Acute Care

## 2022-04-26 ENCOUNTER — Other Ambulatory Visit (HOSPITAL_COMMUNITY): Payer: Self-pay | Admitting: Cardiology

## 2022-04-26 ENCOUNTER — Encounter: Payer: Self-pay | Admitting: Acute Care

## 2022-04-26 VITALS — BP 122/68 | HR 114 | Temp 97.9°F | Ht 68.0 in | Wt 165.2 lb

## 2022-04-26 DIAGNOSIS — F17210 Nicotine dependence, cigarettes, uncomplicated: Secondary | ICD-10-CM

## 2022-04-26 DIAGNOSIS — R911 Solitary pulmonary nodule: Secondary | ICD-10-CM | POA: Diagnosis not present

## 2022-04-26 DIAGNOSIS — Z720 Tobacco use: Secondary | ICD-10-CM

## 2022-04-26 NOTE — Patient Instructions (Addendum)
It is good to see you today. Your Ct Chest shows the nodule we were concerned about has resolved. This is great news.  We will continue to monitor this through the lung cancer screening program.  First annual scan due 03/2023. Call us if you need [place] sooner.  Please contact office for sooner follow up if symptoms do not improve or worsen or seek emergency care

## 2022-04-26 NOTE — Telephone Encounter (Signed)
..   Medicaid Managed Care   Unsuccessful Outreach Note  04/26/2022 Name: Ashley [name] MRN: [id number] DOB: May 11, 1966  Referred by: [name], MD Reason for referral : High Risk Managed Medicaid (I called the patient today to get her rescheduled with the MM RNCM. I left my name and number on her VM.)   A second unsuccessful telephone outreach was attempted today. The patient was referred to the case management team for assistance with care management and care coordination.   Follow Up Plan: The care management team will reach out to the patient again over the next 7 days.     [place]

## 2022-04-26 NOTE — Progress Notes (Signed)
History of Present Illness Ashley [name] is a 56 y.o. female with history cardiomyopathy, chronic systolic heart failure, of A-fib on chronic anticoagulation here for evaluation after mechanical fall,  mitral valve disease, history of repair. She is on Eliquis for her atrial fibrillation. CAD with history of  CABG in 2013.  Longstanding history of tobacco use.  Here today for evaluation after having a CT scan of the chest which showed a new 20 mm right upper lobe apical groundglass opacity.  She recently quit smoking in April 2023.  She smoked for 30+ years. She is followed by Dr. Valeta Harms. She states she quit smoking in March 2023 after her most recent hospitalization. .    04/26/2022 Pt. Presents for 3 month follow up CT Chest. The lesion that was there in April 2023 was gone. Per the CT Chest it has resolved.  She is not on inhalers or rescue. She denies any shortness of breath.She does have some shortness of breath when her a fib gets to a rate of 160. Her dyspnea is heart related per the patient.  She has been in a fib since they stopped her amiodarone recently . October she may have ablation done. She may to have her mitral valve repaired if her next echo shows increased mitral regurgitation.  From a pulmonary standpoint, she is doing well. Denise shortness of breath, orthopnea or hemoptysis. We will enroll her in the lung cancer screening program for annual screening to continue to monitor for lung nodules in this high risk patient.   Test Results: Chest Imaging: Super D CT Chest 04/19/2022 Lungs/Pleura: Interval resolution of a previously seen subsolid nodular opacity in the right apex (series 5, image 19). No suspicious pulmonary nodules. Bandlike scarring of the lingula. Minimal centrilobular emphysema and diffuse bilateral bronchial wall thickening. No pleural effusion or pneumothorax.  Interval resolution of previously seen subsolid nodular opacity in the right apex. No suspicious  pulmonary nodules. Minimal emphysema and diffuse bilateral bronchial wall thickening. Coronary artery disease.   12/17/2021 CTA chest: Right upper lobe apical groundglass opacity.  Some enlarged mediastinal and hilar nodes.  Likely reactive. Groundglass lesion new from previous imaging.     Latest Ref Rng & Units 02/24/2022    9:25 PM 02/24/2022    9:10 PM 01/03/2022   10:03 AM  CBC  WBC 4.0 - 10.5 K/uL  9.6  6.7   Hemoglobin 12.0 - 15.0 g/dL 14.3  13.3  13.1   Hematocrit 36.0 - 46.0 % 42.0  40.6  41.6   Platelets 150 - 400 K/uL  218  244        Latest Ref Rng & Units 04/04/2022   10:23 AM 02/24/2022    9:25 PM 02/24/2022    9:10 PM  BMP  Glucose 70 - 99 mg/dL 105  128  129   BUN 6 - 24 mg/dL '30  30  30   '$ Creatinine 0.57 - 1.00 mg/dL 1.65  1.70  1.59   BUN/Creat Ratio 9 - 23 18     Sodium 134 - 144 mmol/L 138  133  134   Potassium 3.5 - 5.2 mmol/L 5.0  4.2  4.3   Chloride 96 - 106 mmol/L 101  98  96   CO2 20 - 29 mmol/L 25   24   Calcium 8.7 - 10.2 mg/dL 9.8   10.1     BNP    Component Value Date/Time   BNP 1,132.5 (H) 01/27/2022 1403  ProBNP    Component Value Date/Time   PROBNP 8,252.0 (H) 10/25/2011 0947    PFT    Component Value Date/Time   FEV1PRE 1.95 08/01/2019 1547   FEV1POST 2.18 08/01/2019 1547   FVCPRE 2.65 08/01/2019 1547   FVCPOST 3.02 08/01/2019 1547   TLC 5.48 08/01/2019 1547   DLCOUNC 18.60 08/01/2019 1547   PREFEV1FVCRT 74 08/01/2019 1547   PSTFEV1FVCRT 72 08/01/2019 1547    CT Super D Chest Wo Contrast  Result Date: 04/20/2022 CLINICAL DATA:  Right upper lobe nodule EXAM: CT CHEST WITHOUT CONTRAST TECHNIQUE: Multidetector CT imaging of the chest was performed using thin slice collimation for electromagnetic bronchoscopy planning purposes, without intravenous contrast. RADIATION DOSE REDUCTION: This exam was performed according to the departmental dose-optimization program which includes automated exposure control, adjustment of the mA and/or  kV according to patient size and/or use of iterative reconstruction technique. COMPARISON:  CT chest angiogram, 12/17/2021 FINDINGS: Cardiovascular: Aortic atherosclerosis. Left chest multi lead pacer. Cardiomegaly status post median sternotomy and CABG. Three-vessel coronary artery calcifications and stents. No pericardial effusion. Mediastinum/Nodes: No enlarged mediastinal, hilar, or axillary lymph nodes. Thyroid gland, trachea, and esophagus demonstrate no significant findings. Lungs/Pleura: Interval resolution of a previously seen subsolid nodular opacity in the right apex (series 5, image 19). No suspicious pulmonary nodules. Bandlike scarring of the lingula. Minimal centrilobular emphysema and diffuse bilateral bronchial wall thickening. No pleural effusion or pneumothorax. Upper Abdomen: No acute abnormality. High attenuation of the liver parenchyma, suggesting amiodarone use. Musculoskeletal: No chest wall abnormality. No acute osseous findings. IMPRESSION: 1. Interval resolution of previously seen subsolid nodular opacity in the right apex. No suspicious pulmonary nodules. 2. Minimal emphysema and diffuse bilateral bronchial wall thickening. 3. Coronary artery disease. Aortic Atherosclerosis (ICD10-I70.0) and Emphysema (ICD10-J43.9). Electronically Signed   By: Delanna Ahmadi M.D.   On: 04/20/2022 09:05     Past medical hx Past Medical History:  Diagnosis Date   Abdominal pain    ? chronic cholecystitis; admxn to [facility] 9/12 (CT, USN, HIDA done)   Atrial fibrillation ([place])    Cardiomyopathy    echo 9/12: mild LVH, EF 35-40%, mod MR (difficult to judge /post leaflet restricted), mod BAE, mod RVE, mod TR    Cardiomyopathy ([place])    a. echo 9/12: mild LVH, EF 35-40%, mod MR (difficult to judge /post leaflet restricted), mod BAE, mod RVE, mod TR. b. Echo 11/2011: mild LVH, EF 55-60%, s/p MV repair w/o sig MR/MS, mildly dilated RV/RA, PASP 29mHg. // AFL dx 10/2018 - Echo 10/2018: EF 30, diff HK, s/p MV  repair with trivial MR, mean MV 7 but not c/w significant mitral stenosis, mild RAE, severe LAE, mod reduced RVSF    Carotid artery disease ([place]    a. Duplex 06/2015: 50% RECA, 1-39% BICA.   CHF (congestive heart failure) (HCC)    Clotting disorder (HCC)    Coronary artery disease    a. s/p CABG 10/2011 - LIMA-LAD, SVG-PDA, SVG-OM, SVG-diagonal.   Dyslipidemia    Essential hypertension    Heart murmur    Hemifacial spasm    History of Doppler ultrasound    a. [facility] [place] [place]: no hemodynamically significant ICA stenosis   Hyperlipidemia    Mitral regurgitation    a. s/p MV repair 10/2011 - on Coumadin for 3 months afterwards then d/c'd by surgery.   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 age 32 - in setting of cigs and OCPs. //  b. s/p acute L parietal CVA >> [facility]PA - [facility] in [place], [name]   Stroke [facility]) 01/2017   Tobacco abuse      Social History   Tobacco Use   Smoking status: Former    Packs/day: 1.00    Years: 30.00    Total pack years: 30.00    Types: Cigarettes    Quit date: 05/07/2011    Years since quitting: 10.9   Smokeless tobacco: Never  Vaping Use   Vaping Use: Never used  Substance Use Topics   Alcohol use: Yes    Alcohol/week: 1.0 standard drink of alcohol    Types: 1 Cans of beer per week    Comment: can of beer 1 x week   Drug use: No    Ms.Restivo reports that she quit smoking about 10 years ago. Her smoking use included cigarettes. She has a 30.00 pack-year smoking history. She has never used smokeless tobacco. She reports current alcohol use of about 1.0 standard drink of alcohol per week. She reports that she does not use drugs.  Tobacco Cessation: 30+ pack year smoking history. Quit 10/2021   Past surgical hx, Family hx, Social hx all reviewed.  Current Outpatient Medications on File Prior to Visit   Medication Sig   acetaminophen (TYLENOL) 500 MG tablet Take 500-1,000 mg by mouth every 6 (six) hours as needed for headache.   apixaban (ELIQUIS) 5 MG TABS tablet Take 1 tablet (5 mg total) by mouth 2 (two) times daily.   Ascorbic Acid (VITAMIN C) 1000 MG tablet Take 1,000 mg by mouth 2 (two) times daily.    atorvastatin (LIPITOR) 80 MG tablet TAKE ONE TABLET BY MOUTH ONE TIME DAILY   dapagliflozin propanediol (FARXIGA) 10 MG TABS tablet Take 1 tablet (10 mg total) by mouth daily before breakfast.   diphenhydrAMINE (BENADRYL) 25 MG tablet Take 25 mg by mouth every 6 (six) hours as needed for allergies.   ENTRESTO 24-26 MG TAKE ONE TABLET BY MOUTH TWICE A DAY   famotidine (PEPCID) 20 MG tablet Take 20 mg by mouth daily as needed for indigestion.    ferrous sulfate 325 (65 FE) MG tablet Take 1 tablet (325 mg total) by mouth daily with breakfast.   furosemide (LASIX) 40 MG tablet Take 1 tablet (40 mg total) by mouth daily. Take extra tablet in PM as needed   Homeopathic Products (ARNICARE ARNICA EX) Apply 1 application. topically as needed (bruises).   lidocaine (LIDODERM) 5 % Place 1 patch onto the skin daily. Remove & Discard patch within 12 hours or as directed by MD   metoprolol succinate (TOPROL-XL) 100 MG 24 hr tablet Take 1 tablet (100 mg total) by mouth daily. Take with or immediately following a meal.   predniSONE (DELTASONE) 20 MG tablet Take 1 tablet (20 mg total) by mouth daily with breakfast for 14 days, THEN 0.5 tablets (10 mg total) daily with breakfast for 14 days.   sodium chloride (OCEAN) 0.65 % SOLN nasal spray Place 1 spray into both nostrils daily as needed (for congestion).   spironolactone (ALDACTONE) 25 MG tablet Take 1 tablet (25 mg total) by mouth daily.   vitamin B-12 (CYANOCOBALAMIN) 1000 MCG tablet Take 1 tablet (1,000 mcg total) by mouth daily.   Current Facility-Administered Medications on File Prior to Visit  Medication   incobotulinumtoxinA (XEOMIN) 50 units  injection 50 Units   incobotulinumtoxinA (XEOMIN) 50 units injection 50 Units   incobotulinumtoxinA (XEOMIN) 50 units injection 50 Units     Allergies  Allergen Reactions   Azithromycin Nausea Only   Latex Rash   Spinach Hives    Cooked    Tape Dermatitis and Other (See Comments)    PLEASE USE PAPER TAPE!! AFFECTS SKIN BADLY!!!!    Review Of Systems:  Constitutional:   No  weight loss, night sweats,  Fevers, chills, fatigue, or  lassitude.  HEENT:   No headaches,  Difficulty swallowing,  Tooth/dental problems, or  Sore throat,                No sneezing, itching, ear ache, nasal congestion, post nasal drip,   CV:  No chest pain,  Orthopnea, PND, swelling in lower extremities, anasarca, dizziness, palpitations, syncope.   GI  No heartburn, indigestion, abdominal pain, nausea, vomiting, diarrhea, change in bowel habits, loss of appetite, bloody stools.   Resp: No shortness of breath with exertion or at rest.  No excess mucus, no productive cough,  No non-productive cough,  No coughing up of blood.  No change in color of mucus.  No wheezing.  No chest wall deformity>> + Shortness of breath with a-fib with RVR  Skin: no rash or lesions.  GU: no dysuria, change in color of urine, no urgency or frequency.  No flank pain, no hematuria   MS:  No joint pain or swelling.  No decreased range of motion.  No back pain.  Psych:  No change in mood or affect. No depression or anxiety.  No memory loss.   Vital Signs BP 122/68 (BP Location: Right Arm, Patient Position: Sitting, Cuff Size: Normal)   Pulse (!) 114   Temp 97.9 F (36.6 C) (Oral)   Ht '5\' 8"'$  (1.727 m)   Wt 165 lb 3.2 oz (74.9 kg)   LMP 05/18/2015 Comment: went for 3 week after skipping two months   SpO2 98% Comment: RA  BMI 25.12 kg/m    Physical Exam:  General- No distress,  A&Ox3, pleasant ENT: No sinus tenderness, TM clear, pale nasal mucosa, no oral exudate,no post nasal drip, no LAN, left eye droop Cardiac: S1,  S2, regular rate and rhythm, no murmur Chest: No wheeze/ rales/ dullness; no accessory muscle use, no nasal flaring, no sternal retractions Abd.: Soft Non-tender, ND, BS +, .Body mass index is 25.12 kg/m.  Ext: No clubbing cyanosis, edema Neuro:  normal strength, MAE x 4, A&O x 3 Skin: No rashes, warm and dry, no lesions  Psych: normal mood and behavior   Assessment/Plan Resolution of Right upper lobe apical groundglass opacity.   Enlarged mediastinal and hilar nodes, also resolved Former smoker with 30+ pack year smoking history Quit 10/2021 Plan Your Ct Chest shows the nodule we were concerned about has resolved. This is great news.  We will continue to monitor this through the lung cancer screening program.  First annual scan due 03/2023. Call us if you need [place] sooner.  Please contact office for sooner follow up if symptoms do not improve or worsen or seek emergency care  I spent 35 minutes dedicated to the care of this patient on the date of this encounter to include pre-visit review of records, face-to-face time with the patient discussing conditions above, post visit ordering of testing, clinical documentation with the electronic health record, making appropriate referrals as documented, and communicating necessary information to the patient's healthcare team.  Magdalen Spatz, NP 04/26/2022  10:02 AM

## 2022-04-27 NOTE — Telephone Encounter (Signed)
Spoke with pt and advised per Dr Caryl Comes her appointment is to discuss alternatives to her Amiodarone for Afib.  Pt states she has been in and out of Afib since last 30.  She states rates at times has been 114-120.  Denies current CP, SOB or dizziness.  Reviewed ED precautions.  Pt verbalizes understanding and thanked Therapist, sports for the call.

## 2022-05-02 ENCOUNTER — Telehealth: Payer: Self-pay | Admitting: Family Medicine

## 2022-05-02 NOTE — Telephone Encounter (Signed)
..   Medicaid Managed Care   Unsuccessful Outreach Note  05/02/2022 Name: Ashley [name] MRN: [id number] DOB: 11-05-1965  Referred by: [name], MD Reason for referral : High Risk Managed Medicaid (I called the patient today to get her rescheduled with the MM Team. I left my name and number on her VM. This was the third attempt to reach the patient.)   Third unsuccessful telephone outreach was attempted today. The patient was referred to the case management team for assistance with care management and care coordination. The patient's primary care provider has been notified of our unsuccessful attempts to make or maintain contact with the patient. The care management team is pleased to engage with this patient at any time in the future should he/she be interested in assistance from the care management team.   Follow Up Plan: We have been unable to make contact with the patient for follow up. The care management team is available to follow up with the patient after provider conversation with the patient regarding recommendation for care management engagement and subsequent re-referral to the care management team.    [place], [place]

## 2022-05-04 ENCOUNTER — Other Ambulatory Visit: Payer: Self-pay | Admitting: Internal Medicine

## 2022-05-18 ENCOUNTER — Ambulatory Visit (HOSPITAL_BASED_OUTPATIENT_CLINIC_OR_DEPARTMENT_OTHER)
Admission: RE | Admit: 2022-05-18 | Discharge: 2022-05-18 | Disposition: A | Discharge status: Home / Self Care | Payer: Medicaid Other | Source: Ambulatory Visit | Attending: Internal Medicine | Admitting: Internal Medicine

## 2022-05-18 ENCOUNTER — Ambulatory Visit (HOSPITAL_COMMUNITY)
Admission: RE | Admit: 2022-05-18 | Discharge: 2022-05-18 | Disposition: A | Discharge status: Home / Self Care | Payer: Medicaid Other | Source: Ambulatory Visit | Attending: Internal Medicine | Admitting: Internal Medicine

## 2022-05-18 VITALS — BP 120/60 | HR 69 | Wt 169.0 lb

## 2022-05-18 DIAGNOSIS — I5022 Chronic systolic (congestive) heart failure: Secondary | ICD-10-CM | POA: Insufficient documentation

## 2022-05-18 DIAGNOSIS — Z9889 Other specified postprocedural states: Secondary | ICD-10-CM | POA: Diagnosis not present

## 2022-05-18 DIAGNOSIS — I2510 Atherosclerotic heart disease of native coronary artery without angina pectoris: Secondary | ICD-10-CM

## 2022-05-18 DIAGNOSIS — I081 Rheumatic disorders of both mitral and tricuspid valves: Secondary | ICD-10-CM | POA: Insufficient documentation

## 2022-05-18 DIAGNOSIS — I498 Other specified cardiac arrhythmias: Secondary | ICD-10-CM | POA: Diagnosis not present

## 2022-05-18 LAB — T4, FREE: Free T4: 1.15 ng/dL — ABNORMAL HIGH (ref 0.61–1.12)

## 2022-05-18 LAB — COMPREHENSIVE METABOLIC PANEL
ALT: 28 U/L (ref 0–44)
AST: 24 U/L (ref 15–41)
Albumin: 4.3 g/dL (ref 3.5–5.0)
Alkaline Phosphatase: 120 U/L (ref 38–126)
Anion gap: 11 (ref 5–15)
BUN: 21 mg/dL — ABNORMAL HIGH (ref 6–20)
CO2: 30 mmol/L (ref 22–32)
Calcium: 9.7 mg/dL (ref 8.9–10.3)
Chloride: 101 mmol/L (ref 98–111)
Creatinine, Ser: 1.46 mg/dL — ABNORMAL HIGH (ref 0.44–1.00)
GFR, Estimated: 42 mL/min — ABNORMAL LOW (ref >60)
Glucose, Bld: 130 mg/dL — ABNORMAL HIGH (ref 70–99)
Potassium: 4.8 mmol/L (ref 3.5–5.1)
Sodium: 142 mmol/L (ref 135–145)
Total Bilirubin: 1.4 mg/dL — ABNORMAL HIGH (ref 0.3–1.2)
Total Protein: 7.2 g/dL (ref 6.5–8.1)

## 2022-05-18 LAB — CBC
HCT: 42.8 % (ref 36.0–46.0)
Hemoglobin: 14.2 g/dL (ref 12.0–15.0)
MCH: 32.4 pg (ref 26.0–34.0)
MCHC: 33.2 g/dL (ref 30.0–36.0)
MCV: 97.7 fL (ref 80.0–100.0)
Platelets: 202 10*3/uL (ref 150–400)
RBC: 4.38 MIL/uL (ref 3.87–5.11)
RDW: 14 % (ref 11.5–15.5)
WBC: 5.2 10*3/uL (ref 4.0–10.5)
nRBC: 0 % (ref 0.0–0.2)

## 2022-05-18 LAB — ECHOCARDIOGRAM COMPLETE
Area-P 1/2: 1.44 cm2
Calc EF: 43.8 %
MV VTI: 1.3 cm2
S' Lateral: 3.9 cm
Single Plane A2C EF: 40.1 %
Single Plane A4C EF: 47.1 %

## 2022-05-18 LAB — TSH: TSH: 0.124 u[IU]/mL — ABNORMAL LOW (ref 0.350–4.500)

## 2022-05-18 LAB — BRAIN NATRIURETIC PEPTIDE: B Natriuretic Peptide: 387.9 pg/mL — ABNORMAL HIGH (ref 0.0–100.0)

## 2022-05-18 NOTE — Progress Notes (Signed)
ADVANCED HEART FAILURE CLINIC NOTE   Primary Care: Dr. Margarita Rana Primary Cardiologist: Establishing with Dr. Johney Frame HF Dr. Haroldine Laws   HPI: 56 y.o. female with history of CAD s/p CABG in 2013, MVP with severe MR s/p MV repair at time of CABG, chronic systolic CHF, sinus node dysfunction s/p 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   Found to be in atrial flutter with RVR 10/2018. EF down to 30% on echo. Underwent DCCV 01/2019. She had recurrence of AF but EF had improved to 60-65% on subsequent echo 07/20.   Echo 01/23: EF 50-55%  Has been on amiodarone for atrial arrhythmias. Amiodarone increased 03/23 d/t atrial tachycardia.   Admitted 04/23 with AT with RVR and volume overload. Attempts to pace out were unsuccessful and she underwent DCCV. She was readmitted about 10 days later with recurrent AF and volume overload. Also treated with abx for possible CAP. She was placed on amiodarone drip and converted to SR. Diuresed with IV lasix. Transitioned to P.O. lasxi 40 mg BID at discharge. Echo: EF 30-35%, RV moderately reduced, RVSP 58 mmHg, severe LAE, moderate to severe mitral stenosis with mean gradient 9 mmHg and moderate MR, severe TR  Seen in TOC for f/u 05/22. Weight down 7 lb from visit 1 week prior, almost 40 lb over a month. Lasix decreased to 40 mg daily alternating with 40 mg every other day.  TEE 01/19/22: EF 30-35%, , RV severely enlarged with moderately reduced function, severe LAE, mild mitral stenosis with MG 4 mmHg across mitral valve (likely related to small ring), moderate MR, RVSP 58 mmHg, moderate TR  Here today with her son. Saw Dr. Caryl Comes about 1 month ago and amio stopped due to hyperthyroidism. Treated with prednisone x 4 weeks. Feels pretty good. Denies palpitations. But son says he can see her heart racing in her neck and then he takes her pulse and it will be 120-140. Tends to last 1-6 hours and goes  back down to 90. She doesn't feel it. Able to do ADLs. No orthopnea or PND. BP ok.   Echo today 05/18/22 EF 40% mild to moderate MR, mild TR Personally reviewed  PM check in clinic today: 61% AP. AT/AF burden 0% since 03/07/22  Has an occasional beer. Quit smoking April 2023.    Review of Systems: Cardiac and Respiratory. Negative except as mentioned in HPI   Past Medical History:  Diagnosis Date   Abdominal pain    ? chronic cholecystitis; admxn to [facility] 9/12 (CT, USN, HIDA done)   Atrial fibrillation ([place])    Cardiomyopathy    echo 9/12: mild LVH, EF 35-40%, mod MR (difficult to judge /post leaflet restricted), mod BAE, mod RVE, mod TR    Cardiomyopathy ([place])    a. echo 9/12: mild LVH, EF 35-40%, mod MR (difficult to judge /post leaflet restricted), mod BAE, mod RVE, mod TR. b. Echo 11/2011: mild LVH, EF 55-60%, s/p MV repair w/o sig MR/MS, mildly dilated RV/RA, PASP 7mHg. // AFL dx 10/2018 - Echo 10/2018: EF 30, diff HK, s/p MV repair with trivial MR, mean MV 7 but not c/w significant mitral stenosis, mild RAE, severe LAE, mod reduced RVSF    Carotid artery disease ([facility]    a. Duplex 06/2015: 50% RECA, 1-39% BICA.   CHF (congestive heart failure) ([facility])    Clotting disorder ([place]    Coronary artery disease    a. s/p CABG 10/2011 -  LIMA-LAD, SVG-PDA, SVG-OM, SVG-diagonal.   Dyslipidemia    Essential hypertension    Heart murmur    Hemifacial spasm    History of Doppler ultrasound    a. Carotid US [facility] in [place], [name]) 7/17: no hemodynamically significant ICA stenosis   Hyperlipidemia    Mitral regurgitation    a. s/p MV repair 10/2011 - on Coumadin for 3 months afterwards then d/c'd by surgery.   Recurrent upper respiratory infection (URI)    S/P CABG x 4 10/21/2011   S/P mitral valve repair 10/21/2011   Stroke ([place]) 1989   a. age 80 - in setting of cigs and OCPs. //  b. s/p acute L parietal CVA >> tPA - [facility] in [place], [name]   Stroke [facility]) 01/2017    Tobacco abuse     Current Outpatient Medications  Medication Sig Dispense Refill   acetaminophen (TYLENOL) 500 MG tablet Take 500-1,000 mg by mouth every 6 (six) hours as needed for headache.     apixaban (ELIQUIS) 5 MG TABS tablet Take 1 tablet (5 mg total) by mouth 2 (two) times daily. 180 tablet 3   Ascorbic Acid (VITAMIN C) 1000 MG tablet Take 1,000 mg by mouth 2 (two) times daily.      atorvastatin (LIPITOR) 80 MG tablet TAKE ONE TABLET BY MOUTH ONE TIME DAILY 90 tablet 3   dapagliflozin propanediol (FARXIGA) 10 MG TABS tablet Take 1 tablet (10 mg total) by mouth daily before breakfast. 30 tablet 3   diphenhydrAMINE (BENADRYL) 25 MG tablet Take 25 mg by mouth every 6 (six) hours as needed for allergies.     ENTRESTO 24-26 MG TAKE ONE TABLET BY MOUTH TWICE A DAY 60 tablet 11   famotidine (PEPCID) 20 MG tablet Take 20 mg by mouth daily as needed for indigestion.      ferrous sulfate 325 (65 FE) MG tablet Take 1 tablet (325 mg total) by mouth daily with breakfast. 30 tablet 3   furosemide (LASIX) 40 MG tablet Take 1 tablet (40 mg total) by mouth daily. Take extra tablet in PM as needed 45 tablet 3   Homeopathic Products (ARNICARE ARNICA EX) Apply 1 application. topically as needed (bruises).     metoprolol succinate (TOPROL-XL) 100 MG 24 hr tablet Take 1 tablet (100 mg total) by mouth daily. Take with or immediately following a meal. 90 tablet 3   sodium chloride (OCEAN) 0.65 % SOLN nasal spray Place 1 spray into both nostrils daily as needed (for congestion).     vitamin B-12 (CYANOCOBALAMIN) 1000 MCG tablet Take 1 tablet (1,000 mcg total) by mouth daily.     lidocaine (LIDODERM) 5 % Place 1 patch onto the skin daily. Remove & Discard patch within 12 hours or as directed by MD (Patient not taking: Reported on 05/18/2022) 30 patch 0   spironolactone (ALDACTONE) 25 MG tablet Take 1 tablet (25 mg total) by mouth daily. 30 tablet 2   Current Facility-Administered Medications  Medication Dose  Route Frequency Provider Last Rate Last Admin   incobotulinumtoxinA (XEOMIN) 50 units injection 50 Units  50 Units Intramuscular Q90 days Marcial Pacas, MD   50 Units at 11/30/16 1546   incobotulinumtoxinA (XEOMIN) 50 units injection 50 Units  50 Units Intramuscular Q90 days Marcial Pacas, MD   50 Units at 07/25/17 0843   incobotulinumtoxinA (XEOMIN) 50 units injection 50 Units  50 Units Intramuscular Q90 days Marcial Pacas, MD   50 Units at 07/26/17 1217  Allergies  Allergen Reactions   Azithromycin Nausea Only   Latex Rash   Spinach Hives    Cooked    Tape Dermatitis and Other (See Comments)    PLEASE USE PAPER TAPE!! AFFECTS SKIN BADLY!!!!      Social History   Socioeconomic History   Marital status: Widowed    Spouse name: Not on file   Number of children: 2   Years of education: Not on file   Highest education level: Bachelor's degree (e.g., BA, AB, BS)  Occupational History   Occupation: Payroll/Paperwork    Comment: 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   Occupation: Disability    Comment: since march 2023  Tobacco Use   Smoking status: Former    Packs/day: 1.00    Years: 30.00    Total pack years: 30.00    Types: Cigarettes    Quit date: 10/2021    Years since quitting: 0.5   Smokeless tobacco: Never  Vaping Use   Vaping Use: Never used  Substance and Sexual Activity   Alcohol use: Yes    Alcohol/week: 1.0 standard drink of alcohol    Types: 1 Cans of beer per week    Comment: can of beer 1 x week   Drug use: No   Sexual activity: Yes  Other Topics Concern   Not on file  Social History Narrative   Lives at home with husband and son.   Right-handed.   2 cups caffeine daily.   Social Determinants of Health   Financial Resource Strain: Not on file  Food Insecurity: No Food Insecurity (12/21/2021)   Hunger Vital Sign    Worried About Running Out of Food in the Last Year: Never true    Ran Out of Food in the Last Year: Never true  Transportation Needs: No  Transportation Needs (12/21/2021)   PRAPARE - Hydrologist (Medical): No    Lack of Transportation (Non-Medical): No  Physical Activity: Not on file  Stress: Not on file  Social Connections: Not on file  Intimate Partner Violence: Not on file      Family History  Problem Relation Age of Onset   Hypertension Mother    Stroke Mother    Heart disease Father    COPD Father    Bone cancer Maternal Grandmother    Anesthesia problems Neg Hx    Hypotension Neg Hx    Malignant hyperthermia Neg Hx    Pseudochol deficiency Neg Hx    Colon cancer Neg Hx     Vitals:   05/18/22 1002  BP: 120/60  Pulse: 69  SpO2: 98%  Weight: 76.7 kg (169 lb)      PHYSICAL EXAM: General:  Ambulated in clinic . No resp difficulty HEENT: normal Neck: supple. no JVD. Carotids 2+ bilat; no bruits. No lymphadenopathy or thryomegaly appreciated. Cor: PMI nondisplaced. Regular rate & rhythm. No rubs, gallops or murmurs. Lungs: clear decreased throughout  Abdomen: soft, nontender, nondistended. No hepatosplenomegaly. No bruits or masses. Good bowel sounds. Extremities: no cyanosis, clubbing, rash, edema Neuro: alert & orientedx3, cranial nerves grossly intact. moves all 4 extremities w/o difficulty. Affect pleasant   Pacer interrogated in clinic with help of device rep. Device says AT/AF burden = 0% but v-histogram shows episodes of ventricular rates 110-130. Not well captured on a-histograms. PVC 1.2%   ASSESSMENT & PLAN: Chronic systolic CHF: -Echo 62/5638: EF 55-60% -Echo 10/2018: EF 30%. In setting of AFL with RVR -Echo 02/2019: EF 60-65% -  Echo 01/23: EF 50-55% -Recurrent atrial arrhythmias end of January 2023 -Echo 04/23 in setting of atrial tach with RVR: EF 30-35%, RV moderately reduced, RVSP 58 mmHg, s/p MV repair,at least moderate MR, severe TR, dilated IVC with estimated RAP 15 mmHg -TEE 01/19/22: EF 30-35%, RV severely enlarged with moderately reduced function,  severe LAE, mild mitral stenosis with MG 4 mmHg across mitral valve ring (likely related to small ring), moderate MR, RVSP 58 mmHg, moderate TR - Echo today 05/18/22 EF 40% mild to moderate MR, mild MS, mild TR Personally reviewed -Etiology not certain. ?Tachy-mediated in setting of atrial arrhythmias. EF recovering slowly with restoration of SR.Will continue to follow. We discussed possible cath to look for progressive CAD/vein graft disease but have decided to defer with improving EF and no s/s angina. No cMRI with pacemaker. -Improved NYHA II Volume looks good on exam. Takes lasix 40 daily with extra as needed -Continue Metoprolol XL 100 mg daily -Continue Entresto 24/26 mg BID -Continue Farxiga 10 mg daily -Continue spiro 25 mg daily -Labs today  2. CAD: -S/p CABG (LIMA to LAD, SVG to PDA, SVG to OM, SVG to diagonal) in 2013 -No aspirin with anticoagulation -Continue statin -No s/s angina. See discussion above  3. S/p mitral valve repair with MR/MS: -Echo 04/23: EF 30-35%, severe MR and possibly severe mitral stenosis. Posterior leaflet restricted.  -TEE 05/31: EF 30-35%, mild mitral valve stenosis with mean gradient 4 mmHg (d/t small ring) and moderate MR - Echo today 05/18/22 EF 40% mild to moderate MR, mild MS, mild TR Personally reviewed  4. Tricuspid regurgitation: -Severe on echo 04/23 -Moderate on TEE 05/23 - mild on echo today  5. Atrial arrhythmias - AT/AFL/AF: - Followed by Dr.Klein. Off amio in setting of hyperthyroidism  - Clinically appears to be having episodes of tachyarrhythmias but device not capturing well. Reviewed extensively with device rep and device parameters adjusted to try to better capture - Has f/u with Dr. Caryl Comes next month to review and discuss possible abaltion -Anticoagulated with Eliquis 5 mg BID.   6. Tobacco use: -Quit smoking cold [place] 04/23  7. HLD:  -On high-intensity statin  8. Amio-induced hyperthyroidism - amio stopped 8/23. Treated  with prednisone - repeat TFTs today  9. Pulmonary nodules: -Noted on CT scan 04/23. Nodularity up to 2 cm right lung apex and areas of ground-glass nodularity RUL. Findings possibly infectious or inflammatory. Neoplasm not exculded. -Recently saw Pulmonary. Nodules resolved   Total time spent 55 minutes. Over half that time spent discussing above.   Glori Bickers, MD  10:23 AM

## 2022-05-18 NOTE — Patient Instructions (Addendum)
There has been no changes to your medications.  Labs done today, your results will be available in MyChart, we will contact you for abnormal readings.  Your physician recommends that you schedule a follow-up appointment in: 6 months ( March 2024) ** please call the office in January to arrange your follow up appointment **  If you have any questions or concerns before your next appointment please send us a message through mychart or call our office at [phone].    TO LEAVE A MESSAGE FOR THE NURSE SELECT OPTION 2, PLEASE LEAVE A MESSAGE INCLUDING: YOUR NAME DATE OF BIRTH CALL BACK NUMBER REASON FOR CALL**this is important as we prioritize the call backs  YOU WILL RECEIVE A CALL BACK THE SAME DAY AS LONG AS YOU CALL BEFORE 4:00 PM  At the [facility], you and your health needs are our priority. As part of our continuing mission to provide you with exceptional heart care, we have created designated Provider Care Teams. These Care Teams include your primary Cardiologist (physician) and Advanced Practice Providers (APPs- Physician Assistants and Nurse Practitioners) who all work together to provide you with the care you need, when you need it.   You may see any of the following providers on your designated Care Team at your next follow up: Dr Daniel Bensimhon Dr Dalton McLean Dr. Aditya Sabharwal Amy Clegg, NP Brittainy Simmons, PA Jessica [place],NP Lindsay Finch, PA Alma Diaz, NP Lauren Kemp, PharmD   Please be sure to bring in all your medications bottles to every appointment.    

## 2022-05-20 ENCOUNTER — Encounter: Payer: Medicaid Other | Admitting: Student

## 2022-05-22 NOTE — Progress Notes (Unsigned)
Cardiology Office Note Date:  05/22/2022  Patient ID:  Ashley [name], DOB [birth year], MRN [id number] PCP:  [name], MD  Cardiologist:  Dr. Harrington Challenger > Dr. Johney Frame Electrophysiologist: Dr. Caryl Comes     Chief Complaint: *** revisit ablation  History of Present Illness: Ashley [name] is a 56 y.o. female with history of CAD, VHD, (CABG, MV repair, 2013), ICM, HTN, HLD, stroke (at 56 y/o in setting of tobacco use and OBC) another stoke 2017, paroxysmal AFib.,   She was admitted to [facility] 06/14/2019.  She went for DCCV after which she had 10 seconds of asystole >> junctional rhythm in the 20's.  She was treated with atropine and ephedrine required dopamine for hypotension >> SB 40's-50's. She was observed overnight, weaned off pressor.  Notes report SR, HR 60's In d/w Dr. Caryl Comes, home cored stopped, amiodarone continued.  Discharged and planned to have Zio AT placed.  Her amlodipine was stopped and started on ARB given her CM.  Subsequently underwent [facility] April 2023 with AT w/RVR, attempts of ATP failed > DCCV.  Recurrent reduction in LVEF  Readmitted about 10 days later with recurrent AF and volume overload. She was placed on amiodarone drip and converted to SR. Diuresed with IV lasix. Transitioned to P.O. lasxi 40 mg BID at discharge. Echo: EF 30-35%, RV moderately reduced, RVSP 58 mmHg, severe LAE, posterior leaflet mitral valve appears severely restricted, moderate to severe mitral stenosis with mean gradient 9 mmHg and moderate MR ( may not be fully visualized, thick CW doppler jet), severe TR.  TEE showed EF 30-35%, mild-to-moderate MS with MVA 1.71cm2 by 3D planimetry/2.17cm2 by continuity with good leaflet motion (likely small ring), moderate MR due to posterior leaflet restriction, moderate TR  She saw Dr. Caryl Comes 03/07/22, she was doing OK, no symptoms of her arrhythmia.  Histograms looked OK, no findings to suggest recurrent AT, low PVC burden.  Last echo with  concern of progressive MS, planned to have her f/u with Dr. Johney Frame.  Surveillance labs noted low TSH, amio stopped, treated with prednisone, and planned to revisit ablation  She saw Dr. Johney Frame 04/04/22, she was doing well, weight was done significantly active, able to keep up with her grandchild. Planned to update her echo in Sept on GDMT and  if rate/rhythm remained controlled.  She saw Dr. Haroldine Laws 05/18/22, though was having some tachycardias, noted mostly by visible racing by seeing her pulse fast in her neck vasculature.  Lasting hours, though not otherwise able to tell.  TTE the same day with LVEF 40%, mild-mod MR, mild TR. PPM check 61% AP, no AF/AT episodes With some improvement in EF and no symptoms of angina, not planned for cath, unable to get MRI 2/2 PPM.   Planned for labs Discussed that with industry assistance, device adjusted to try and better capture any slower AT/arrhythmia episodes  Labs were improving Creat 1.46 (down some from poiors) BNP 387 TSH still low 0.124, but improving, free T4 1.15  *** symptoms, perhaps ST w/thyroid?  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  chronic cholecystitis; admxn to [facility] 9/12 (CT, USN, HIDA done)   Atrial fibrillation ([place])    Cardiomyopathy    echo 9/12: mild LVH, EF 35-40%, mod MR (difficult to judge /post leaflet restricted), mod BAE, mod RVE, mod TR    Cardiomyopathy ([place])    a. echo 9/12: mild LVH, EF 35-40%, mod MR (difficult to judge /post leaflet restricted), mod BAE, mod RVE, mod TR. b. Echo 11/2011: mild LVH, EF  55-60%, s/p MV repair w/o sig MR/MS, mildly dilated RV/RA, PASP 20mHg. // AFL dx 10/2018 - Echo 10/2018: EF 30, diff HK, s/p MV repair with trivial MR, mean MV 7 but not c/w significant mitral stenosis, mild RAE, severe LAE, mod reduced RVSF    [place]id artery disease [place]    a. Duplex 06/2015: 50% RECA, 1-39% BICA.   CHF (congestive heart failure) (HCC)    Clotting disorder (HCC)    Coronary artery disease    a. s/p CABG 10/2011 - LIMA-LAD, SVG-PDA, SVG-OM, SVG-diagonal.   Dyslipidemia    Essential hypertension    Heart murmur    Hemifacial spasm    History of Doppler ultrasound    a. [facility] [name] [place] 7[place]: no hemodynamically significant ICA stenosis   Hyperlipidemia    Mitral regurgitation    a. s/p MV repair 10/2011 - on Coumadin for 3 months afterwards then d/c'd by surgery.   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 age 56- in setting of cigs and OCPs. //  b. s/p acute L parietal CVA >> [facility] [place]  [facility]/2017   Tobacco abuse     Past Surgical History:  Procedure Laterality Date   CARDIAC CATHETERIZATION     CARDIOVERSION N/A 01/25/2019   Procedure: CARDIOVERSION;  Surgeon: HPixie Casino MD;  [facility]  Service: Cardiovascular;  Laterality: N/A;   CARDIOVERSION N/A 06/14/2019   Procedure: CARDIOVERSION;  Surgeon: HPixie Casino MD;  [facility];  Service: Cardiovascular;  Laterality: N/A;   [place]   CORONARY ARTERY BYPASS GRAFT  10/21/2011   Procedure: CORONARY ARTERY BYPASS GRAFTING (CABG);  Surgeon: CRexene Alberts MD;  [place]  Service: Open Heart Surgery;  Laterality: N/A;  cabg x four, using right leg greater saphenous vein harvested endoscopically   DENTAL SURGERY     MITRAL VALVE REPAIR  10/21/2011   Procedure: MITRAL VALVE REPAIR (MVR);   Surgeon: CRexene Alberts MD;  [place]  Service: Open Heart Surgery;  Laterality: N/A;   PACEMAKER IMPLANT N/A 06/25/2019   Procedure: PACEMAKER IMPLANT;  Surgeon: AThompson Grayer MD;  [facility];  Service: Cardiovascular;  Laterality: N/A;   TEE WITHOUT CARDIOVERSION  06/30/2011   Procedure: TRANSESOPHAGEAL ECHOCARDIOGRAM (TEE);  Surgeon: PFay Records MD;  [facility];  Service: Cardiovascular;  Laterality: N/A;   TEE WITHOUT CARDIOVERSION N/A 01/19/2022   Procedure: TRANSESOPHAGEAL ECHOCARDIOGRAM (TEE);  Surgeon: OGeralynn Rile MD;  [place]  Service: Cardiovascular;  Laterality: N/A;   TONSILLECTOMY     TUBAL LIGATION      Current Outpatient Medications  Medication Sig Dispense Refill   acetaminophen (TYLENOL) 500 MG tablet Take 500-1,000 mg by mouth every 6 (six) hours as needed for headache.  apixaban (ELIQUIS) 5 MG TABS tablet Take 1 tablet (5 mg total) by mouth 2 (two) times daily. 180 tablet 3   Ascorbic Acid (VITAMIN C) 1000 MG tablet Take 1,000 mg by mouth 2 (two) times daily.      atorvastatin (LIPITOR) 80 MG tablet TAKE ONE TABLET BY MOUTH ONE TIME DAILY 90 tablet 3   dapagliflozin propanediol (FARXIGA) 10 MG TABS tablet Take 1 tablet (10 mg total) by mouth daily before breakfast. 30 tablet 3   diphenhydrAMINE (BENADRYL) 25 MG tablet Take 25 mg by mouth every 6 (six) hours as needed for allergies.     ENTRESTO 24-26 MG TAKE ONE TABLET BY MOUTH TWICE A DAY 60 tablet 11   famotidine (PEPCID) 20 MG tablet Take 20 mg by mouth daily as needed for indigestion.      ferrous sulfate 325 (65 FE) MG tablet Take 1 tablet (325 mg total) by mouth daily with breakfast. 30 tablet 3   furosemide (LASIX) 40 MG tablet Take 1 tablet (40 mg total) by mouth daily. Take extra tablet in PM as needed 45 tablet 3   Homeopathic Products (ARNICARE ARNICA EX) Apply 1 application. topically as needed (bruises).     lidocaine (LIDODERM) 5 % Place 1  patch onto the skin daily. Remove & Discard patch within 12 hours or as directed by MD (Patient not taking: Reported on 05/18/2022) 30 patch 0   metoprolol succinate (TOPROL-XL) 100 MG 24 hr tablet Take 1 tablet (100 mg total) by mouth daily. Take with or immediately following a meal. 90 tablet 3   sodium chloride (OCEAN) 0.65 % SOLN nasal spray Place 1 spray into both nostrils daily as needed (for congestion).     spironolactone (ALDACTONE) 25 MG tablet Take 1 tablet (25 mg total) by mouth daily. 30 tablet 2   vitamin B-12 (CYANOCOBALAMIN) 1000 MCG tablet Take 1 tablet (1,000 mcg total) by mouth daily.     Current Facility-Administered Medications  Medication Dose Route Frequency Provider Last Rate Last Admin   incobotulinumtoxinA (XEOMIN) 50 units injection 50 Units  50 Units Intramuscular Q90 days Marcial Pacas, MD   50 Units at 11/30/16 1546   incobotulinumtoxinA (XEOMIN) 50 units injection 50 Units  50 Units Intramuscular Q90 days Marcial Pacas, MD   50 Units at 07/25/17 0843   incobotulinumtoxinA (XEOMIN) 50 units injection 50 Units  50 Units Intramuscular Q90 days Marcial Pacas, MD   50 Units at 07/26/17 1217    Allergies:   Azithromycin, Latex, Spinach, and Tape   Social History:  The patient  reports that she quit smoking about 7 months ago. Her smoking use included cigarettes. She has a 30.00 pack-year smoking history. She has never used smokeless tobacco. She reports current alcohol use of about 1.0 standard drink of alcohol per week. She reports that she does not use drugs.   Family History:  The patient's family history includes Bone cancer in her maternal grandmother; COPD in her father; Heart disease in her father; Hypertension in her mother; Stroke in her mother.  ROS:  Please see the history of present illness.  All other systems are reviewed and otherwise negative.   PHYSICAL EXAM:  VS:  LMP 05/18/2015 Comment: went for 3 week after skipping two months  BMI: There is no height or  weight on file to calculate BMI. Well nourished, well developed, in no acute distress  HEENT: normocephalic, atraumatic  Neck: no JVD, carotid bruits or masses Cardiac: ***  RRR; no significant murmurs,  no rubs, or gallops Lungs:  *** CTA b/l, no wheezing, rhonchi or rales  Abd: soft, nontender MS: no deformity or atrophy Ext: *** no edema  Skin: warm and dry, no rash Neuro:  No gross deficits appreciated Psych: euthymic mood, full affect  PPM site: ***   EKG:  Done today and reviewed by myself shows ***  Device interrogation done today and reviewed by myself ***   -Echo 11/2011: EF 55-60% -Echo 10/2018: EF 30%. In setting of AFL with RVR -Echo 02/2019: EF 60-65% -Echo 01/23: EF 50-55% -Recurrent atrial arrhythmias end of January 2023 -Echo 04/23 in setting of atrial tach with RVR: EF 30-35%, RV moderately reduced, RVSP 58 mmHg, s/p MV repair,at least moderate MR, severe TR, dilated IVC with estimated RAP 15 mmHg -TEE 01/19/22: EF 30-35%, RV severely enlarged with moderately reduced function, severe LAE, mild mitral stenosis with MG 4 mmHg across mitral valve ring (likely related to small ring), moderate MR, RVSP 58 mmHg, moderate TR - Echo 05/18/22 EF 40% mild to moderate MR, mild MS, mild TR Personally reviewed    03/05/2019: TTE IMPRESSIONS  1. The left ventricle has normal systolic function, with an ejection fraction of 60-65%. The cavity size was normal.  2. The right ventricle has normal systolc function. The cavity was normal. There is no increase in right ventricular wall thickness.  3. Aortic valve regurgitation was not assessed by color flow Doppler.  4. Pulmonic valve regurgitation was not assessed by color flow Doppler.     11/01/2018: LVEF 30% IMPRESSIONS 1. The left ventricle has a visually estimated ejection fraction of 30%. The cavity size was normal. Left ventricular diastolic Doppler parameters are indeterminate. Left ventricular diffuse hypokinesis.  2.  Status post mitral valve repair. Trivial mitral regurgitation. Mean gradient across mitral valve 7 mmHg but PHT short at 71 msec so doubt significant mitral stenosis.  3. The aortic valve is tricuspid Mild calcification of the aortic valve. no stenosis of the aortic valve.  4. The aortic root and ascending aorta are normal in size and structure.  5. Right atrial size was mildly dilated.  6. Left atrial size was severely dilated.  7. The inferior vena cava was dilated in size with >50% respiratory variability. PA systolic pressure 35 mmHg.  8. The right ventricle has moderately reduced systolic function. The cavity was normal. There is no increase in right ventricular wall thickness.     11/21/11: LVEF 55-60%    Recent Labs: 05/18/2022: ALT 28; B Natriuretic Peptide 387.9; BUN 21; Creatinine, Ser 1.46; Hemoglobin 14.2; Platelets 202; Potassium 4.8; Sodium 142; TSH 0.124  No results found for requested labs within last 365 days.   Estimated Creatinine Clearance: 46.3 mL/min (A) (by C-G formula based on SCr of 1.46 mg/dL (H)).   Wt Readings from Last 3 Encounters:  05/18/22 169 lb (76.7 kg)  04/26/22 165 lb 3.2 oz (74.9 kg)  04/04/22 160 lb 3.2 oz (72.7 kg)     Other studies reviewed: Additional studies/records reviewed today include: summarized above  ASSESSMENT AND PLAN:  1. Paroxysmal AFib 2. ATach     CHA2DS2Vasc is 5 (6 with female) on Eliquis, *** appropriately dosed     ***  3. PPM ***   4. CAD     *** No anginal symptoms     *** Off BB with brady event, on lipitor     C/w Dr.Ross  5. VHD s/pMVRepair     *** No symptoms of progressive VHD     ***  No significant murmuir on exam, echo is up to date     C/w Dr. Johney Frame  6. CM, etiology is uncertain, ? Tachy mediated     *** reduced again, echos above     *** No symptoms or exam findings to suggest fluid OL     *** c/w HF team and Dr. Layla Barter       Disposition: ***  Current medicines are reviewed at  length with the patient today.  The patient did not have any concerns regarding medicines.  Venetia Night, PA-C 05/22/2022 8:47 AM     [address] [place] [address] [phone] (office)  [phone] (fax)

## 2022-05-23 ENCOUNTER — Ambulatory Visit: Payer: Medicaid Other

## 2022-05-23 ENCOUNTER — Ambulatory Visit: Payer: Medicaid Other | Attending: Student | Admitting: Physician Assistant

## 2022-05-23 ENCOUNTER — Other Ambulatory Visit (HOSPITAL_COMMUNITY): Payer: Self-pay | Admitting: Physician Assistant

## 2022-05-23 VITALS — BP 98/60 | HR 60 | Ht 68.5 in | Wt 167.0 lb

## 2022-05-23 DIAGNOSIS — I480 Paroxysmal atrial fibrillation: Secondary | ICD-10-CM | POA: Diagnosis not present

## 2022-05-23 DIAGNOSIS — Z950 Presence of cardiac pacemaker: Secondary | ICD-10-CM

## 2022-05-23 DIAGNOSIS — R002 Palpitations: Secondary | ICD-10-CM | POA: Diagnosis not present

## 2022-05-23 DIAGNOSIS — I4719 Other supraventricular tachycardia: Secondary | ICD-10-CM | POA: Diagnosis not present

## 2022-05-23 LAB — CUP PACEART INCLINIC DEVICE CHECK
Date Time Interrogation Session: 20231002093755
Implantable Lead Implant Date: 20201103
Implantable Lead Implant Date: 20201103
Implantable Lead Location: 753859
Implantable Lead Location: 753860
Implantable Pulse Generator Implant Date: 20201103
Lead Channel Pacing Threshold Amplitude: 0.75 V
Lead Channel Pacing Threshold Amplitude: 1 V
Lead Channel Pacing Threshold Pulse Width: 0.5 ms
Lead Channel Pacing Threshold Pulse Width: 0.5 ms
Lead Channel Sensing Intrinsic Amplitude: 12 mV
Lead Channel Sensing Intrinsic Amplitude: 5 mV
Pulse Gen Model: 2272
Pulse Gen Serial Number: 9174760

## 2022-05-23 MED ORDER — SPIRONOLACTONE 25 MG PO TABS
25.0000 mg | ORAL_TABLET | Freq: Every day | ORAL | 2 refills | Status: DC
Start: 2022-05-23 — End: 2022-05-23

## 2022-05-23 NOTE — Patient Instructions (Addendum)
Medication Instructions:   Your physician recommends that you continue on your current medications as directed. Please refer to the Current Medication list given to you today.   *If you need a refill on your cardiac medications before your next appointment, please call your pharmacy*   Lab Work: [place]   If you have labs (blood work) drawn today and your tests are completely normal, you will receive your results only by: [place] (if you have MyChart) OR A paper copy in the mail If you have any lab test that is abnormal or we need to change your treatment, we will call you to review the results.   Testing/Procedures: Your physician has recommended that you wear an event monitor. Event monitors are medical devices that record the heart's electrical activity. Doctors most often [place] these monitors to diagnose arrhythmias. Arrhythmias are problems with the speed or rhythm of the heartbeat. The monitor is a small, portable device. You can wear one while you do your normal daily activities. This is usually used to diagnose what is causing palpitations/syncope (passing out).   Follow-Up: At [facility], you and your health needs are our priority.  As part of our continuing mission to provide you with exceptional heart care, we have created designated Provider Care Teams.  These Care Teams include your primary Cardiologist (physician) and Advanced Practice Providers (APPs -  Physician Assistants and Nurse Practitioners) who all work together to provide you with the care you need, when you need it.  We recommend signing up for the patient portal called "MyChart".  Sign up information is provided on this After Visit Summary.  MyChart is used to connect with patients for Virtual Visits (Telemedicine).  Patients are able to view lab/test results, encounter notes, upcoming appointments, etc.  Non-urgent messages can be sent to your provider as well.   To learn more about what  you can do with MyChart, go to [email or web address].    Your next appointment:   2 month(s)  The format for your next appointment:   In Person  Provider:   Virl Axe, MD    Other Instructions  [employer] Cardiac Event Monitor Instructions Your physician has requested you wear your cardiac event monitor for __30___ days, (1-30). [employer] may call or text to confirm a shipping address. The monitor will be sent to a land address via UPS. [employer] will not ship a monitor to a PO BOX. It typically takes 3-5 days to receive your monitor after it has been enrolled. [employer] will assist with USPS tracking if your package is delayed. The telephone number for [employer] is [phone]. Once you have received your monitor, please review the enclosed instructions. Instruction tutorials can also be viewed under help and settings on the enclosed cell phone. Your monitor has already been registered assigning a specific monitor serial # to you.  Applying the monitor Remove cell phone from case and turn it on. The cell phone works as Dealer and needs to be within [employer] of you at all times. The cell phone will need to be charged on a daily basis. We recommend you plug the cell phone into the enclosed charger at your bedside table every night.  Monitor batteries: You will receive two monitor batteries labelled #1 and #2. These are your recorders. Plug battery #2 onto the second connection on the enclosed charger. Keep one battery on the charger at all times. This will keep the monitor battery deactivated. It will also keep  it fully charged for when you need to switch your monitor batteries. A small light will be blinking on the battery emblem when it is charging. The light on the battery emblem will remain on when the battery is fully charged.  Open package of a Monitor strip. Insert battery #1 into black hood on strip and gently squeeze monitor battery onto connection as  indicated in instruction booklet. Set aside while preparing skin.  Choose location for your strip, vertical or horizontal, as indicated in the instruction booklet. Shave to remove all hair from location. There cannot be any lotions, oils, powders, or colognes on skin where monitor is to be applied. Wipe skin clean with enclosed Saline wipe. Dry skin completely.  Peel paper labeled #1 off the back of the Monitor strip exposing the adhesive. Place the monitor on the chest in the vertical or horizontal position shown in the instruction booklet. One arrow on the monitor strip must be pointing upward. Carefully remove paper labeled #2, attaching remainder of strip to your skin. Try not to create any folds or wrinkles in the strip as you apply it.  Firmly press and release the circle in the center of the monitor battery. You will hear a small beep. This is turning the monitor battery on. The heart emblem on the monitor battery will light up every 5 seconds if the monitor battery in turned on and connected to the patient securely. Do not push and hold the circle down as this turns the monitor battery off. The cell phone will locate the monitor battery. A screen will appear on the cell phone checking the connection of your monitor strip. This may read poor connection initially but change to good connection within the next minute. Once your monitor accepts the connection you will hear a series of 3 beeps followed by a climbing crescendo of beeps. A screen will appear on the cell phone showing the two monitor strip placement options. Touch the picture that demonstrates where you applied the monitor strip.  Your monitor strip and battery are waterproof. You are able to shower, bathe, or swim with the monitor on. They just ask you do not submerge deeper than 3 feet underwater. We recommend removing the monitor if you are swimming in a lake, river, or ocean.  Your monitor battery will need to be switched  to a fully charged monitor battery approximately once a week. The cell phone will alert you of an action which needs to be made.  On the cell phone, tap for details to reveal connection status, monitor battery status, and cell phone battery status. The green dots indicates your monitor is in good status. A red dot indicates there is something that needs your attention.  To record a symptom, click the circle on the monitor battery. In 30-60 seconds a list of symptoms will appear on the cell phone. Select your symptom and tap save. Your monitor will record a sustained or significant arrhythmia regardless of you clicking the button. Some patients do not feel the heart rhythm irregularities. [employer] will notify us of any serious or critical events.  Refer to instruction booklet for instructions on switching batteries, changing strips, the Do not disturb or Pause features, or any additional questions.  Call [employer] at [phone], to confirm your monitor is transmitting and record your baseline. They will answer any questions you may have regarding the monitor instructions at that time.  Returning the monitor to [place] all equipment back into blue box. Peel off  strip of paper to expose adhesive and close box securely. There is a prepaid UPS shipping label on this box. Drop in a UPS drop box, or at a UPS facility like [employer]. You may also contact [employer] to arrange UPS to pick up monitor package at your home.   Important Information About Sugar

## 2022-05-23 NOTE — Addendum Note (Signed)
Addended by: Sharee Holster R on: 05/23/2022 09:51 AM   Modules accepted: Orders

## 2022-05-24 ENCOUNTER — Encounter: Payer: Self-pay | Admitting: *Deleted

## 2022-05-24 NOTE — Progress Notes (Signed)
Patient ID: Ashley [name], female   DOB: 03/03/1966, 56 y.o.   MRN: [id number] Patient enrolled for [employer] to ship a 30 day cardiac event monitor to her address on file.

## 2022-05-24 NOTE — Addendum Note (Signed)
Addended by: Sharee Holster R on: 05/24/2022 07:58 AM   Modules accepted: Orders

## 2022-05-29 ENCOUNTER — Ambulatory Visit: Payer: Medicaid Other | Attending: Physician Assistant

## 2022-05-29 DIAGNOSIS — R002 Palpitations: Secondary | ICD-10-CM

## 2022-05-31 ENCOUNTER — Encounter: Payer: Self-pay | Admitting: Internal Medicine

## 2022-06-21 ENCOUNTER — Ambulatory Visit (INDEPENDENT_AMBULATORY_CARE_PROVIDER_SITE_OTHER): Payer: Medicaid Other

## 2022-06-21 DIAGNOSIS — I495 Sick sinus syndrome: Secondary | ICD-10-CM

## 2022-06-21 LAB — CUP PACEART REMOTE DEVICE CHECK
Battery Remaining Longevity: 85 mo
Battery Remaining Percentage: 74 %
Battery Voltage: 3.01 V
Brady Statistic AP VP Percent: 1 %
Brady Statistic AP VS Percent: 75 %
Brady Statistic AS VP Percent: 1 %
Brady Statistic AS VS Percent: 23 %
Brady Statistic RA Percent Paced: 72 %
Brady Statistic RV Percent Paced: 1 %
Date Time Interrogation Session: 20231031020014
Implantable Lead Connection Status: 753985
Implantable Lead Connection Status: 753985
Implantable Lead Implant Date: 20201103
Implantable Lead Implant Date: 20201103
Implantable Lead Location: 753859
Implantable Lead Location: 753860
Implantable Pulse Generator Implant Date: 20201103
Lead Channel Impedance Value: 510 Ohm
Lead Channel Impedance Value: 660 Ohm
Lead Channel Pacing Threshold Amplitude: 0.75 V
Lead Channel Pacing Threshold Amplitude: 1 V
Lead Channel Pacing Threshold Pulse Width: 0.5 ms
Lead Channel Pacing Threshold Pulse Width: 0.5 ms
Lead Channel Sensing Intrinsic Amplitude: 12 mV
Lead Channel Sensing Intrinsic Amplitude: 5 mV
Lead Channel Setting Pacing Amplitude: 1.5 V
Lead Channel Setting Pacing Amplitude: 2.5 V
Lead Channel Setting Pacing Pulse Width: 0.5 ms
Lead Channel Setting Sensing Sensitivity: 2 mV
Pulse Gen Model: 2272
Pulse Gen Serial Number: 9174760

## 2022-06-27 ENCOUNTER — Other Ambulatory Visit (HOSPITAL_COMMUNITY): Payer: Self-pay | Admitting: Physician Assistant

## 2022-07-06 NOTE — Progress Notes (Signed)
Remote pacemaker transmission.   

## 2022-07-20 ENCOUNTER — Ambulatory Visit: Payer: Medicaid Other | Attending: Physician Assistant | Admitting: Physician Assistant

## 2022-07-20 ENCOUNTER — Ambulatory Visit: Payer: Self-pay | Admitting: *Deleted

## 2022-07-20 ENCOUNTER — Encounter: Payer: Self-pay | Admitting: Physician Assistant

## 2022-07-20 VITALS — BP 181/84 | HR 60 | Temp 98.2°F | Ht 68.0 in | Wt 171.0 lb

## 2022-07-20 DIAGNOSIS — M62838 Other muscle spasm: Secondary | ICD-10-CM

## 2022-07-20 DIAGNOSIS — M549 Dorsalgia, unspecified: Secondary | ICD-10-CM | POA: Diagnosis not present

## 2022-07-20 MED ORDER — DICLOFENAC SODIUM 75 MG PO TBEC: 75.0000 mg | DELAYED_RELEASE_TABLET | Freq: Two times a day (BID) | ORAL | 0 refills | Status: DC

## 2022-07-20 MED ORDER — DICLOFENAC SODIUM 1 % EX GEL: 2.0000 g | Freq: Four times a day (QID) | CUTANEOUS | 1 refills | Status: DC

## 2022-07-20 MED ORDER — KETOROLAC TROMETHAMINE 60 MG/2ML IM SOLN
60.0000 mg | Freq: Once | INTRAMUSCULAR | Status: AC
  Administered 2022-07-20: 60 mg via INTRAMUSCULAR

## 2022-07-20 MED ORDER — METHOCARBAMOL 500 MG PO TABS: 1000.0000 mg | ORAL_TABLET | Freq: Three times a day (TID) | ORAL | 0 refills | Status: DC | PRN

## 2022-07-20 NOTE — Patient Instructions (Signed)
Increase your water intake to improve your kidney function

## 2022-07-20 NOTE — Progress Notes (Signed)
Patient ID: Ashley [name], female   DOB: 05/13/1966, 56 y.o.   MRN: [id number]      [place], is a 56 y.o. female  [id number]  [id number]  DOB - Oct 11, 1965  Chief Complaint  Patient presents with   Shoulder Pain    R Shoulder blade pain X1 week.       Subjective:   Ashley [name] is a 56 y.o. female here today for R upper back pain that started about 5 days ago.  NKI.  She thought her dog might have kicked her in the bed.  The pain is sharp.  Worse with movement.  No help with tylenol.  Minimal help with Ibu.  No paresthesias.  No weakness.  She has had shingles previously-no rash with this pain.  Massage helped a little.  She does admit to stress bc she and her son have to move out of their place within 2 months.    No problems updated.  ALLERGIES: Allergies  Allergen Reactions   Azithromycin Nausea Only   Latex Rash   Spinach Hives    Cooked    Tape Dermatitis and Other (See Comments)    PLEASE USE PAPER TAPE!! AFFECTS SKIN BADLY!!!!    PAST MEDICAL HISTORY: Past Medical History:  Diagnosis Date   Abdominal pain    ? chronic cholecystitis; admxn to [facility] 9/12 (CT, USN, HIDA done)   Atrial fibrillation ([place])    Cardiomyopathy    echo 9/12: mild LVH, EF 35-40%, mod MR (difficult to judge /post leaflet restricted), mod BAE, mod RVE, mod TR    Cardiomyopathy ([place])    a. echo 9/12: mild LVH, EF 35-40%, mod MR (difficult to judge /post leaflet restricted), mod BAE, mod RVE, mod TR. b. Echo 11/2011: mild LVH, EF 55-60%, s/p MV repair w/o sig MR/MS, mildly dilated RV/RA, PASP 47mHg. // AFL dx 10/2018 - Echo 10/2018: EF 30, diff HK, s/p MV repair with trivial MR, mean MV 7 but not c/w significant mitral stenosis, mild RAE, severe LAE, mod reduced RVSF    Carotid artery disease ([place]    a. Duplex 06/2015: 50% RECA, 1-39% BICA.   CHF (congestive heart failure) (HCC)    Clotting disorder (HCC)    Coronary artery disease    a. s/p CABG 10/2011 - LIMA-LAD, SVG-PDA,  SVG-OM, SVG-diagonal.   Dyslipidemia    Essential hypertension    Heart murmur    Hemifacial spasm    History of Doppler ultrasound    a[facility] [place] [place] [facility]: no hemodynamically significant ICA stenosis   Hyperlipidemia    Mitral regurgitation    a. s/p MV repair 10/2011 - on Coumadin for 3 months afterwards then d/c'd by surgery.   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 age 56- in setting of cigs and OCPs. //  b. s/[facility] [place]  [facility]roke ([facility] 01/2017   Tobacco abuse     MEDICATIONS AT HOME: Prior to Admission medications   Medication Sig Start Date End Date Taking? Authorizing Provider  acetaminophen (TYLENOL) 500 MG tablet Take 500-1,000 mg by mouth every 6 (six) hours as needed for headache.   Yes [provider]  apixaban (ELIQUIS) 5 MG TABS tablet Take 1 tablet (5 mg total) by mouth 2 (two)  times daily. 11/01/21  Yes [name], MD  Ascorbic Acid (VITAMIN C) 1000 MG tablet Take 1,000 mg by mouth 2 (two) times daily.    Yes [provider]  atorvastatin (LIPITOR) 80 MG tablet TAKE ONE TABLET BY MOUTH ONE TIME DAILY 04/13/22  Yes [name], MD  dapagliflozin propanediol (FARXIGA) 10 MG TABS tablet Take 1 tablet (10 mg total) by mouth daily before breakfast. 02/21/22  Yes Bensimhon, Shaune Pascal, MD  diclofenac (VOLTAREN) 75 MG EC tablet Take 1 tablet (75 mg total) by mouth 2 (two) times daily. 07/20/22  Yes Freeman Caldron M, PA-C  diclofenac Sodium (VOLTAREN ARTHRITIS PAIN) 1 % GEL Apply 2 g topically 4 (four) times daily. 07/20/22  Yes Freeman Caldron M, PA-C  diphenhydrAMINE (BENADRYL) 25 MG tablet Take 25 mg by mouth every 6 (six) hours as needed for allergies.   Yes [provider]  ENTRESTO 24-26 MG TAKE ONE TABLET BY MOUTH TWICE A DAY 04/26/22  Yes Larey Dresser,  MD  famotidine (PEPCID) 20 MG tablet Take 20 mg by mouth daily as needed for indigestion.    Yes [provider]  ferrous sulfate 325 (65 FE) MG tablet Take 1 tablet (325 mg total) by mouth daily with breakfast. 04/14/22  Yes Shirley Friar, PA-C  furosemide (LASIX) 40 MG tablet TAKE ONE TABLET BY MOUTH ONE TIME DAILY TAKE EXTRA TABLET AT NIGHT AS NEEDED 06/27/22  Yes Joette Catching, PA-C  Homeopathic Products (ARNICARE ARNICA EX) Apply 1 application. topically as needed (bruises).   Yes [provider]  lidocaine (LIDODERM) 5 % Place 1 patch onto the skin daily. Remove & Discard patch within 12 hours or as directed by MD 02/24/22  Yes Henderly, Britni A, PA-C  methocarbamol (ROBAXIN) 500 MG tablet Take 2 tablets (1,000 mg total) by mouth every 8 (eight) hours as needed for muscle spasms. 07/20/22  Yes Freeman Caldron M, PA-C  metoprolol succinate (TOPROL-XL) 100 MG 24 hr tablet Take 1 tablet (100 mg total) by mouth daily. Take with or immediately following a meal. 01/14/22  Yes [name], MD  sodium chloride (OCEAN) 0.65 % SOLN nasal spray Place 1 spray into both nostrils daily as needed (for congestion).   Yes [provider]  spironolactone (ALDACTONE) 25 MG tablet Take 1 tablet (25 mg total) by mouth daily. 05/23/22  Yes [name], MD  vitamin B-12 (CYANOCOBALAMIN) 1000 MCG tablet Take 1 tablet (1,000 mcg total) by mouth daily. 06/21/16  Yes Rosalin Hawking, MD    ROS: Neg HEENT Neg resp Neg cardiac Neg GI Neg GU Neg psych Neg neuro  Objective:   Vitals:   07/20/22 1512  BP: (!) 181/84  Pulse: 60  Temp: 98.2 F (36.8 C)  TempSrc: Oral  SpO2: 99%  Weight: 171 lb (77.6 kg)  Height: '5\' 8"'$  (1.727 m)   Exam General appearance : Awake, alert, not in any distress. Speech Clear. Not toxic looking HEENT: Atraumatic and Normocephalic.  Mouth is dry.   Neck: Supple, no JVD. No cervical lymphadenopathy.  Chest: Good air entry bilaterally,  CTAB.  No rales/rhonchi/wheezing CVS: S1 S2 regular, no murmurs.  No TTP of c-spine.  Full S&ROM BUE.  There is trapezius spasm in the R trapezius.  UE DTR =B Extremities: B/L Lower Ext shows no edema, both legs are warm to touch Neurology: Awake alert, and oriented X 3, CN II-XII intact, Non focal Skin: No Rash  Data Review Lab Results  Component  Value Date   HGBA1C 5.1 03/16/2020   HGBA1C 5.3 09/30/2019   HGBA1C 5.5 04/12/2016    Assessment & Plan   1. Upper back pain on right side No red flags - ketorolac (TORADOL) injection 60 mg - diclofenac (VOLTAREN) 75 MG EC tablet; Take 1 tablet (75 mg total) by mouth 2 (two) times daily.  Dispense: 30 tablet; Refill: 0 - diclofenac Sodium (VOLTAREN ARTHRITIS PAIN) 1 % GEL; Apply 2 g topically 4 (four) times daily.  Dispense: 100 g; Refill: 1 - methocarbamol (ROBAXIN) 500 MG tablet; Take 2 tablets (1,000 mg total) by mouth every 8 (eight) hours as needed for muscle spasms.  Dispense: 90 tablet; Refill: 0  2. Muscle spasm - diclofenac (VOLTAREN) 75 MG EC tablet; Take 1 tablet (75 mg total) by mouth 2 (two) times daily.  Dispense: 30 tablet; Refill: 0 - diclofenac Sodium (VOLTAREN ARTHRITIS PAIN) 1 % GEL; Apply 2 g topically 4 (four) times daily.  Dispense: 100 g; Refill: 1 - methocarbamol (ROBAXIN) 500 MG tablet; Take 2 tablets (1,000 mg total) by mouth every 8 (eight) hours as needed for muscle spasms.  Dispense: 90 tablet; Refill: 0    Return in about 3 months (around 10/20/2022) for PCP (Dr Margarita Rana) for chronic conditions.  The patient was given clear instructions to go to ER or return to medical center if symptoms don't improve, worsen or new problems develop. The patient verbalized understanding. The patient was told to call to get lab results if they haven't heard anything in the next week.      Freeman Caldron, PA-C [facility] and [place], [place]   07/20/2022, 4:51 PM

## 2022-07-20 NOTE — Telephone Encounter (Signed)
  Chief Complaint: R shoulder pain Symptoms: pain in R chest to back shoulder blade- better with heat- hurt to raise arm Frequency: started last Wednesday Pertinent Negatives: Patient denies neck pain, swelling, rash, fever, numbness, weakness Disposition: '[]'$ ED /'[]'$ Urgent Care (no appt availability in office) / '[x]'$ Appointment(In office/virtual)/ '[]'$  [place] [facility]/ '[]'$ Home Care/ '[]'$ Refused Recommended Disposition /'[]'$ [place] [facility]/ '[]'$  Follow-up with PCP Additional Notes:

## 2022-07-20 NOTE — Telephone Encounter (Signed)
Summary: shoulder blade pain when coughing and sneezing   Pt states she is having shoulder blade pain when coughing and sneezing  Please assist further       Reason for Disposition  [1] Unable to use arm at all AND [2] because of shoulder pain or stiffness  Answer Assessment - Initial Assessment Questions 1. ONSET: "When did the pain start?"     Last Wednesday 2. LOCATION: "Where is the pain located?"     R front to midback shoulder on R 3. PAIN: "How bad is the pain?" (Scale 1-10; or mild, moderate, severe)   - MILD (1-3): doesn't interfere with normal activities   - MODERATE (4-7): interferes with normal activities (e.g., work or school) or awakens from sleep   - SEVERE (8-10): excruciating pain, unable to do any normal activities, unable to move arm at all due to pain     Can be moderate to severe- comes and goes 4. WORK OR EXERCISE: "Has there been any recent work or exercise that involved this part of the body?"     June/July- had fall 5. CAUSE: "What do you think is causing the shoulder pain?"     unsure 6. OTHER SYMPTOMS: "Do you have any other symptoms?" (e.g., neck pain, swelling, rash, fever, numbness, weakness)     no  Protocols used: Shoulder Pain-A-AH

## 2022-07-21 ENCOUNTER — Telehealth: Payer: Self-pay | Admitting: Family Medicine

## 2022-07-21 ENCOUNTER — Telehealth: Payer: Self-pay | Admitting: Physician Assistant

## 2022-07-21 ENCOUNTER — Other Ambulatory Visit: Payer: Self-pay | Admitting: Physician Assistant

## 2022-07-21 NOTE — Telephone Encounter (Signed)
Pt was advised by Pharmacist that Since pt is Taking Eliquis that she shouldn't be taking diclofenac (VOLTAREN) 75 MG EC tablet / she would like an alternative that would be ok to take while on Eliquis / please advise

## 2022-07-22 NOTE — Telephone Encounter (Signed)
She has been advised to use topical diclofenac.

## 2022-07-22 NOTE — Telephone Encounter (Signed)
Called patient and she is aware    Also stated that gel is on back order and the pharmacy did not give her pills either. Once they receive it they call her.No further question or question

## 2022-08-09 DIAGNOSIS — I493 Ventricular premature depolarization: Secondary | ICD-10-CM | POA: Insufficient documentation

## 2022-08-09 DIAGNOSIS — I428 Other cardiomyopathies: Secondary | ICD-10-CM | POA: Insufficient documentation

## 2022-08-10 ENCOUNTER — Encounter: Payer: Self-pay | Admitting: Internal Medicine

## 2022-08-10 ENCOUNTER — Ambulatory Visit: Payer: Medicaid Other | Attending: Internal Medicine | Admitting: Internal Medicine

## 2022-08-10 VITALS — BP 148/62 | HR 60 | Ht 68.0 in | Wt 169.8 lb

## 2022-08-10 DIAGNOSIS — I4891 Unspecified atrial fibrillation: Secondary | ICD-10-CM

## 2022-08-10 DIAGNOSIS — I495 Sick sinus syndrome: Secondary | ICD-10-CM

## 2022-08-10 DIAGNOSIS — I4719 Other supraventricular tachycardia: Secondary | ICD-10-CM

## 2022-08-10 DIAGNOSIS — I428 Other cardiomyopathies: Secondary | ICD-10-CM | POA: Diagnosis not present

## 2022-08-10 DIAGNOSIS — Z950 Presence of cardiac pacemaker: Secondary | ICD-10-CM

## 2022-08-10 DIAGNOSIS — I493 Ventricular premature depolarization: Secondary | ICD-10-CM

## 2022-08-10 MED ORDER — SACUBITRIL-VALSARTAN 49-51 MG PO TABS: 1.0000 | ORAL_TABLET | Freq: Two times a day (BID) | ORAL | 11 refills | Status: DC

## 2022-08-10 NOTE — Patient Instructions (Signed)
Medication Instructions:  Your physician has recommended you make the following change in your medication:   ** Stop Entresto 24-'26mg'$   ** Begin Entresto 49-'51mg'$  - 1 tablet by mouth twice daily  *If you need a refill on your cardiac medications before your next appointment, please call your pharmacy*   Lab Work: None ordered.  If you have labs (blood work) drawn today and your tests are completely normal, you will receive your results only by: [place] (if you have MyChart) OR A paper copy in the mail If you have any lab test that is abnormal or we need to change your treatment, we will call you to review the results.   Testing/Procedures: None ordered.    Follow-Up: At [facility], you and your health needs are our priority.  As part of our continuing mission to provide you with exceptional heart care, we have created designated Provider Care Teams.  These Care Teams include your primary Cardiologist (physician) and Advanced Practice Providers (APPs -  Physician Assistants and Nurse Practitioners) who all work together to provide you with the care you need, when you need it.  We recommend signing up for the patient portal called "MyChart".  Sign up information is provided on this After Visit Summary.  MyChart is used to connect with patients for Virtual Visits (Telemedicine).  Patients are able to view lab/test results, encounter notes, upcoming appointments, etc.  Non-urgent messages can be sent to your provider as well.   To learn more about what you can do with MyChart, go to [email or web address].    Your next appointment:   12 months with Dr Caryl Comes  Important Information About Sugar

## 2022-08-10 NOTE — Progress Notes (Unsigned)
Patient Care Team: [name], MD as PCP - General (Family Medicine) [name], MD as PCP - Cardiology (Cardiology) [name], MD as PCP - Electrophysiology (Cardiology) [name], MD as Consulting Physician (General Surgery)   HPI  CC atrial Ashley [name] is a 56 y.o. female seen in followup for atrial flutter/atrial fibrillation and sinus node dysfunction prompting pacemaker implantation 11/20 [facility]  Has ischemic heart disease with bypass surgery 2013 with mitral valve annuloplasty for also mitral valve prolapse  Recurrent tachypalpitations.  Discussions regarding antiarrhythmic therapy versus ablation chose dofetilide.  QT prolongation precluded its continuation and she was started on amiodarone.  Anticoagulations with Coumadin because of cost.  Prior stroke at an outside hospital treated tPA 2017  Recurrent atrial arrhythmias  admitted 4/23 with atrial tachycardia and rapid rate and underwent cardioversion following unsuccessful ATP.  Readmitted and treated with amiodarone for recurrent atrial fibrillation.  Amio stopped 7/23 2/2 hyperthyroidism, Rx with prednisone x 4 weeks   Followed by CHF-DB  Saw RU 10/23 Palps>>30 d monitor  ( nothing detected on device) Some V pacing at higher rates ?PMT , also VT nonsustained, but no symptom activations  The patient denies chest pain, shortness of breath, nocturnal dyspnea, orthopnea or peripheral edema.  There have been no palpitations, lightheadedness or syncope.            DATE TEST EF    3/13 Echo   35-40 %    4/13 Echo  55-65%    3/20  Echo  30% LA 51//51  7/20 Echo  60-65% No LA measurement  1/23 Echo  50-55% LAE 51  mL/m Pressure half-time increased from 70--132 ms  4/23 Echo  30-35%   5/23 TEE 30-35% RV severely enlarged and mod dysfn PA pressure 58 TR mod MS-mild-moderate  9/23 Echo  40% RV "hyperdynamic...size is normal" TR mild MS none      Date Cr K Hgb TSH LFTs   3/20    11.7 (6/20) 1.6    9/20 0.84 4.0    19  12/20 0.9 3.9  1.83 (11/20) 17 (10/20)  2/21 1.01 4.7     7/22 0.96 4.3 12.7   1.12 26  9/23 1.46 4.8 14.2 0.124<<(<0.005) 28    Thromboembolic risk factors ( HTN-1, TIA/CVA-2, Vasc disease -1, CHF-1, Gender-1) for a CHADSVASc Score of >=6       Past Medical History:  Diagnosis Date   Abdominal pain    ? chronic cholecystitis; admxn to [facility] 9/12 (CT, USN, HIDA done)   Atrial fibrillation ([place])    Cardiomyopathy    echo 9/12: mild LVH, EF 35-40%, mod MR (difficult to judge /post leaflet restricted), mod BAE, mod RVE, mod TR    Cardiomyopathy ([place])    a. echo 9/12: mild LVH, EF 35-40%, mod MR (difficult to judge /post leaflet restricted), mod BAE, mod RVE, mod TR. b. Echo 11/2011: mild LVH, EF 55-60%, s/p MV repair w/o sig MR/MS, mildly dilated RV/RA, PASP 66mHg. // AFL dx 10/2018 - Echo 10/2018: EF 30, diff HK, s/p MV repair with trivial MR, mean MV 7 but not c/w significant mitral stenosis, mild RAE, severe LAE, mod reduced RVSF    Carotid artery disease ([place]    a. Duplex 06/2015: 50% RECA, 1-39% BICA.   CHF (congestive heart failure) (HCC)    Clotting disorder (HCC)    Coronary artery disease    a. s/p CABG 10/2011 - LIMA-LAD, SVG-PDA,  SVG-OM, SVG-diagonal.   Dyslipidemia    Essential hypertension    Heart murmur    Hemifacial spasm    History of Doppler ultrasound    a. Carotid US [facility] in [place], [name]) 7/17: no hemodynamically significant ICA stenosis   Hyperlipidemia    Mitral regurgitation    a. s/p MV repair 10/2011 - on Coumadin for 3 months afterwards then d/c'd by surgery.   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 age 79 - in setting of cigs and OCPs. //  b. s/p acute L parietal CVA >> tPA - [facility] in [place], [name]   [facility] 01/2017   Tobacco abuse     Past Surgical History:  Procedure  Laterality Date   CARDIAC CATHETERIZATION     CARDIOVERSION N/A 01/25/2019   Procedure: CARDIOVERSION;  Surgeon: [name], MD;  Location: [facility] ENDOSCOPY;  Service: Cardiovascular;  Laterality: N/A;   CARDIOVERSION N/A 06/14/2019   Procedure: CARDIOVERSION;  Surgeon: [name], MD;  Location: [facility] ENDOSCOPY;  Service: Cardiovascular;  Laterality: N/A;   [place]   twins   CORONARY ARTERY BYPASS GRAFT  10/21/2011   Procedure: CORONARY ARTERY BYPASS GRAFTING (CABG);  Surgeon: [name], MD;  Location: [place];  Service: Open Heart Surgery;  Laterality: N/A;  cabg x four, using right leg greater saphenous vein harvested endoscopically   DENTAL SURGERY     MITRAL VALVE REPAIR  10/21/2011   Procedure: MITRAL VALVE REPAIR (MVR);  Surgeon: [name], MD;  Location: [place];  Service: Open Heart Surgery;  Laterality: N/A;   PACEMAKER IMPLANT N/A 06/25/2019   Procedure: PACEMAKER IMPLANT;  Surgeon: [name], MD;  Location: [place] CV LAB;  Service: Cardiovascular;  Laterality: N/A;   TEE WITHOUT CARDIOVERSION  06/30/2011   Procedure: TRANSESOPHAGEAL ECHOCARDIOGRAM (TEE);  Surgeon: [name], MD;  Location[facility] ENDOSCOPY;  Service: Cardiovascular;  Laterality: N/A;   TEE WITHOUT CARDIOVERSION N/A 01/19/2022   Procedure: TRANSESOPHAGEAL ECHOCARDIOGRAM (TEE);  Surgeon: Geralynn Rile, MD;  Location: [place];  Service: Cardiovascular;  Laterality: N/A;   TONSILLECTOMY     TUBAL LIGATION      Current Meds  Medication Sig   acetaminophen (TYLENOL) 500 MG tablet Take 500-1,000 mg by mouth every 6 (six) hours as needed for headache.   apixaban (ELIQUIS) 5 MG TABS tablet Take 1 tablet (5 mg total) by mouth 2 (two) times daily.   Ascorbic Acid (VITAMIN C) 1000 MG tablet Take 1,000 mg by mouth 2 (two) times daily.    atorvastatin (LIPITOR) 80 MG tablet TAKE ONE TABLET BY MOUTH ONE TIME DAILY   dapagliflozin propanediol (FARXIGA) 10 MG TABS tablet Take 1  tablet (10 mg total) by mouth daily before breakfast.   diclofenac Sodium (VOLTAREN ARTHRITIS PAIN) 1 % GEL Apply 2 g topically 4 (four) times daily.   diphenhydrAMINE (BENADRYL) 25 MG tablet Take 25 mg by mouth every 6 (six) hours as needed for allergies.   ENTRESTO 24-26 MG TAKE ONE TABLET BY MOUTH TWICE A DAY   famotidine (PEPCID) 20 MG tablet Take 20 mg by mouth daily as needed for indigestion.    ferrous sulfate 325 (65 FE) MG tablet Take 1 tablet (325 mg total) by mouth daily with breakfast.   furosemide (LASIX) 40 MG tablet TAKE ONE TABLET BY  MOUTH ONE TIME DAILY TAKE EXTRA TABLET AT NIGHT AS NEEDED   Homeopathic Products (ARNICARE ARNICA EX) Apply 1 application. topically as needed (bruises).   lidocaine (LIDODERM) 5 % Place 1 patch onto the skin daily. Remove & Discard patch within 12 hours or as directed by MD   methocarbamol (ROBAXIN) 500 MG tablet Take 2 tablets (1,000 mg total) by mouth every 8 (eight) hours as needed for muscle spasms.   metoprolol succinate (TOPROL-XL) 100 MG 24 hr tablet Take 1 tablet (100 mg total) by mouth daily. Take with or immediately following a meal.   sodium chloride (OCEAN) 0.65 % SOLN nasal spray Place 1 spray into both nostrils daily as needed (for congestion).   spironolactone (ALDACTONE) 25 MG tablet Take 1 tablet (25 mg total) by mouth daily.   vitamin B-12 (CYANOCOBALAMIN) 1000 MCG tablet Take 1 tablet (1,000 mcg total) by mouth daily.   Current Facility-Administered Medications for the 08/10/22 encounter (Office Visit) with [name], MD  Medication   incobotulinumtoxinA (XEOMIN) 50 units injection 50 Units   incobotulinumtoxinA (XEOMIN) 50 units injection 50 Units   incobotulinumtoxinA (XEOMIN) 50 units injection 50 Units    Allergies  Allergen Reactions   Azithromycin Nausea Only   Latex Rash   Spinach Hives    Cooked    Tape Dermatitis and Other (See Comments)    PLEASE USE PAPER TAPE!! AFFECTS SKIN BADLY!!!!      Review of  Systems negative except from HPI and PMH  Physical Exam BP (!) 140/72   Pulse 60   Ht '5\' 8"'$  (1.727 m)   Wt 169 lb 12.8 oz (77 kg)   LMP 05/18/2015 Comment: went for 3 week after skipping two months   SpO2 99%   BMI 25.82 kg/m  Well developed and well nourished in no acute distress HENT normal Neck supple with JVP-flat Clear Device pocket well healed; without hematoma or erythema.  There is no tethering  Regular rate and rhythm, no  murmur Abd-soft with active BS No Clubbing cyanosis  edema Skin-warm and dry A & Oriented  Grossly normal sensory and motor function  ECG atrial pacing at 60 21/12/45  Device function is normal. Programming changes  mode switch rate from 140--160 See Paceart for details   PVC count is 1.9%; however, there are about 20% of her beats with a shorter AV delay and is from normal to 40 and the curve is indeed almost discontinuous suggesting a high volume of PVCs that are long coupled and interpolated most cells in her intrinsic AV interval.  Moreover, there is a atrial rate discontinuity as well suggesting atrial tachycardia as the mechanism of her palpitations about she was previously complaining but is no longer; these rate ranged from about 100--120.       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  With the discontinuation of her amiodarone, suspect her PVC burden and her atrial tachycardia burden will increase; however, at this juncture they are asymptomatic.  In the event that they were to  increase, would try a low-dose calcium blocker given the normalization of her LV function.  Her blood pressure is elevated today.  We will increase her Entresto from 24/26--49/51.

## 2022-08-18 NOTE — Progress Notes (Deleted)
Cardiology Office Note:    Date:  08/18/2022   ID:  [place]erve, DOB [birth year], MRN [id number]  PCP:  [name], [place] Prov[facility] Cardiologist:  Dorris Carnes, MD Electrophysiologist:  Virl Axe, MD {    Referring MD: [name], MD    History of Present Illness:    Ashley [name] is a 56 y.o. female with a hx of with history of CAD s/p CABG in 2013, MVP with severe MR s/p MV repair at time of CABG, chronic systolic CHF, sinus node dysfunction s/p St. Jude pacer 11/20, Hx CVA 2017 treated with tPA, atrial flutter/fibrillation, HTN, HLD, and tobacco use who was referred by Dr. Margarita Rana for follow-up of her CAD, systolic heart failure, and MVR.  Per review of the record, the patient was found to be in atrial flutter with RVR 10/2018. EF down to 30% on echo. Underwent DCCV 01/2019. She had recurrence of AF but EF had improved to 60-65% on subsequent echo 07/20. Repeat TTE 01/23 with LVEF 50-55%. Has been on amiodarone for atrial arrhythmias.   Patient was admitted on 04/23 with AT with RVR and volume overload. Attempts to pace out were unsuccessful and she underwent DCCV. She was readmitted about 10 days later with recurrent AF and volume overload. She was placed on amiodarone drip and converted to SR. Diuresed with IV lasix. Transitioned to P.O. lasxi 40 mg BID at discharge. Echo: EF 30-35%, RV moderately reduced, RVSP 58 mmHg, severe LAE, posterior leaflet mitral valve appears severely restricted, moderate to severe mitral stenosis with mean gradient 9 mmHg and moderate MR ( may not be fully visualized, thick CW doppler jet), severe TR.  Was seen by Dr. Haroldine Laws 12/2021 Was scheduled for TEE/DCCV 01/19/22. TEE showed EF 30-35%, mild-to-moderate MS with MVA 1.71cm2 by 3D planimetry/2.17cm2 by continuity with good leaflet motion (likely small ring), moderate MR due to posterior leaflet restriction, moderate TR.  Was seen by Dr. Haroldine Laws on  05/18/22 where her amiodarone was stopped by Dr. Caryl Comes due to hyperthyroidism. Was treated with prednisone x 4weeks. TTE 05/18/22 EF 40% mild to moderate MR, mild TR Personally reviewed. PPM check at that time: 61% AP. AT/AF burden 0% since 03/07/22.  Was last seen in clinic on 08/10/22 by Dr. Caryl Comes. 30 day monitor showed some v pacing ? PMT, also nonsustained VT.   Today,***  Past Medical History:  Diagnosis Date   Abdominal pain    ? chronic cholecystitis; admxn to [facility] 9/12 (CT, USN, HIDA done)   Atrial fibrillation ([place])    Cardiomyopathy    echo 9/12: mild LVH, EF 35-40%, mod MR (difficult to judge /post leaflet restricted), mod BAE, mod RVE, mod TR    Cardiomyopathy ([place])    a. echo 9/12: mild LVH, EF 35-40%, mod MR (difficult to judge /post leaflet restricted), mod BAE, mod RVE, mod TR. b. Echo 11/2011: mild LVH, EF 55-60%, s/p MV repair w/o sig MR/MS, mildly dilated RV/RA, PASP 22mHg. // AFL dx 10/2018 - Echo 10/2018: EF 30, diff HK, s/p MV repair with trivial MR, mean MV 7 but not c/w significant mitral stenosis, mild RAE, severe LAE, mod reduced RVSF    Carotid artery disease ([place]    a. Duplex 06/2015: 50% RECA, 1-39% BICA.   CHF (congestive heart failure) (HCC)    Clotting disorder (HCC)    Coronary artery disease    a. s/p CABG 10/2011 - LIMA-LAD, SVG-PDA, SVG-OM, SVG-diagonal.   Dyslipidemia    Essential  hypertension    Heart murmur    Hemifacial spasm    History of Doppler ultrasound    a. Carotid [facility] in [place], [name]) 7/17: no hemodynamically significant ICA stenosis   Hyperlipidemia    Mitral regurgitation    a. s/p MV repair 10/2011 - on Coumadin for 3 months afterwards then d/c'd by surgery.   Recurrent upper respiratory infection (URI)    S/P CABG x 4 10/21/2011   S/P mitral valve repair 10/21/2011   Stroke ([place]) 1989   a. age 57 - in setting of cigs and OCPs. //  b. s/p acute L parietal CVA >> tPA - [facility] in [place], [name]   Stroke  [facility]) 01/2017   Tobacco abuse     Past Surgical History:  Procedure Laterality Date   CARDIAC CATHETERIZATION     CARDIOVERSION N/A 01/25/2019   Procedure: CARDIOVERSION;  Surgeon: [name], MD;  Location: [facility] ENDOSCOPY;  Service: Cardiovascular;  Laterality: N/A;   CARDIOVERSION N/A 06/14/2019   Procedure: CARDIOVERSION;  Surgeon: [name], MD;  Location: [facility] ENDOSCOPY;  Service: Cardiovascular;  Laterality: N/A;   [place]   twins   CORONARY ARTERY BYPASS GRAFT  10/21/2011   Procedure: CORONARY ARTERY BYPASS GRAFTING (CABG);  Surgeon: [name], MD;  Location: [place];  Service: Open Heart Surgery;  Laterality: N/A;  cabg x four, using right leg greater saphenous vein harvested endoscopically   DENTAL SURGERY     MITRAL VALVE REPAIR  10/21/2011   Procedure: MITRAL VALVE REPAIR (MVR);  Surgeon: [name], MD;  Location: [place];  Service: Open Heart Surgery;  Laterality: N/A;   PACEMAKER IMPLANT N/A 06/25/2019   Procedure: PACEMAKER IMPLANT;  Surgeon: [name], MD;  Location: [place] CV LAB;  Service: Cardiovascular;  Laterality: N/A;   TEE WITHOUT CARDIOVERSION  06/30/2011   Procedure: TRANSESOPHAGEAL ECHOCARDIOGRAM (TEE);  Surgeon: [name], MD;  Location: [facility] ENDOSCOPY;  Service: Cardiovascular;  Laterality: N/A;   TEE WITHOUT CARDIOVERSION N/A 01/19/2022   Procedure: TRANSESOPHAGEAL ECHOCARDIOGRAM (TEE);  Surgeon: Geralynn Rile, MD;  Location: [place];  Service: Cardiovascular;  Laterality: N/A;   TONSILLECTOMY     TUBAL LIGATION      Current Medications: No outpatient medications have been marked as taking for the 08/29/22 encounter (Appointment) with Freada Bergeron, MD.   Current Facility-Administered Medications for the 08/29/22 encounter (Appointment) with Freada Bergeron, MD  Medication   incobotulinumtoxinA (XEOMIN) 50 units injection 50 Units   incobotulinumtoxinA (XEOMIN) 50 units injection 50 Units    incobotulinumtoxinA (XEOMIN) 50 units injection 50 Units     Allergies:   Azithromycin, Latex, Spinach, and Tape   Social History   Socioeconomic History   Marital status: Widowed    Spouse name: Not on file   Number of children: 2   Years of education: Not on file   Highest education level: Bachelor's degree (e.g., BA, AB, BS)  Occupational History   Occupation: Payroll/Paperwork    Comment: 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   Occupation: Disability    Comment: since march 2023  Tobacco Use   Smoking status: Former    Packs/day: 1.00    Years: 30.00    Total pack years: 30.00    Types: Cigarettes    Quit date: 10/2021    Years since quitting: 0.8   Smokeless tobacco: Never  Vaping Use   Vaping Use: Never used  Substance  and Sexual Activity   Alcohol use: Yes    Alcohol/week: 1.0 standard drink of alcohol    Types: 1 Cans of beer per week    Comment: can of beer 1 x week   Drug use: No   Sexual activity: Yes  Other Topics Concern   Not on file  Social History Narrative   Lives at home with husband and son.   Right-handed.   2 cups caffeine daily.   Social Determinants of Health   Financial Resource Strain: Not on file  Food Insecurity: No Food Insecurity (12/21/2021)   Hunger Vital Sign    Worried About Running Out of Food in the Last Year: Never true    Ran Out of Food in the Last Year: Never true  Transportation Needs: No Transportation Needs (12/21/2021)   PRAPARE - Hydrologist (Medical): No    Lack of Transportation (Non-Medical): No  Physical Activity: Not on file  Stress: Not on file  Social Connections: Not on file     Family History: The patient's family history includes Bone cancer in her maternal grandmother; COPD in her father; Heart disease in her father; Hypertension in her mother; Stroke in her mother. There is no history of Anesthesia problems, Hypotension, Malignant hyperthermia, Pseudochol deficiency, or Colon  cancer.  ROS:   Please see the history of present illness.     All other systems reviewed and are negative.  EKGs/Labs/Other Studies Reviewed:    The following studies were reviewed today: Cardiac Monitor 06/2022:  Findings HR  avg 68  Min 58-Max 111  PVCs 2% PACs 3% AFib none  VT Nonsustained 13 episodes; fastest 145 bpm for 7 beats; longest for 11.5 secs at 123 bpm    Symptoms:             none>   Triggered: none                Conclusions: VT nonsustained may be responsible for symptomatic  palpitaitons but no symptoms noted     Recommendations Continue current therapy    TTE 04/2022: IMPRESSIONS     1. Left ventricular ejection fraction, by estimation, is 40%. The left  ventricle has moderately decreased function. The left ventricle  demonstrates global hypokinesis. Left ventricular diastolic parameters are  indeterminate.   2. Right ventricular systolic function is hyperdynamic. The right  ventricular size is normal. There is normal pulmonary artery systolic  pressure.   3. Left atrial size was moderately dilated.   4. The mitral valve has been repaired/replaced. Mild to moderate mitral  valve regurgitation. No evidence of mitral stenosis. The mean mitral valve  gradient is 4.0 mmHg. Moderate mitral annular calcification.   5. The aortic valve is normal in structure. Aortic valve regurgitation is  not visualized. Aortic valve sclerosis/calcification is present, without  any evidence of aortic stenosis.   6. The inferior vena cava is normal in size with greater than 50%  respiratory variability, suggesting right atrial pressure of 3 mmHg.   TEE 12/2021: IMPRESSIONS     1. 26 mm annuloplasty ring. MG 4.0 mmHG @ 61 bpm. Mild to moderate mitral  stenosis is present likely related to the small annuloplasty ring.  Leaflets are mobile. MVA by directly 3D planimetry 1.71 cm2. MVA by VTI  2.17 cm2. Moderate mitral regurgitation  is present likely related to  restricted movement of the PMVL. 3D VCA 0.21  cm2. The mitral valve has been repaired/replaced. Moderate mitral  valve  regurgitation. Mild to moderate mitral stenosis. There is a 26 mm RING  MITRAL MEMO 3D 26MM prosthetic  annuloplasty ring present in the mitral position. Procedure Date:  10/21/2011.   2. Left ventricular ejection fraction, by estimation, is 30 to 35%. The  left ventricle has moderately decreased function. The left ventricle  demonstrates global hypokinesis. The left ventricular internal cavity size  was mildly dilated.   3. Right ventricular systolic function is moderately reduced. The right  ventricular size is severely enlarged. There is moderately elevated  pulmonary artery systolic pressure.   4. Left atrial size was severely dilated. No left atrial/left atrial  appendage thrombus was detected. The LAA emptying velocity was 23 cm/s.   5. Right atrial size was severely dilated.   6. Moderate tricuspid regurgitation. 3D VCA 0.27 cm2. Tricuspid valve  regurgitation is moderate.   7. The aortic valve is tricuspid. Aortic valve regurgitation is not  visualized. No aortic stenosis is present.   8. There is Moderate (Grade III) layered plaque involving the descending  aorta.  EKG:  EKG is not ordered today.    Recent Labs: 05/18/2022: ALT 28; B Natriuretic Peptide 387.9; BUN 21; Creatinine, Ser 1.46; Hemoglobin 14.2; Platelets 202; Potassium 4.8; Sodium 142; TSH 0.124  Recent Lipid Panel    Component Value Date/Time   CHOL 114 11/16/2017 0838   TRIG 68 11/16/2017 0838   HDL 43 11/16/2017 0838   CHOLHDL 2.7 11/16/2017 0838   CHOLHDL 3.8 07/01/2016 0939   VLDL 25 07/01/2016 0939   LDLCALC 57 11/16/2017 0838     Risk Assessment/Calculations:    CHA2DS2-VASc Score = 6  This indicates a 9.7% annual risk of stroke. The patient's score is based upon: CHF History: 1 HTN History: 1 Diabetes History: 0 Stroke History: 2 Vascular Disease History: 1 Age Score:  0 Gender Score: 1       Physical Exam:    VS:  LMP 05/18/2015 Comment: went for 3 week after skipping two months     Wt Readings from Last 3 Encounters:  08/10/22 169 lb 12.8 oz (77 kg)  07/20/22 171 lb (77.6 kg)  05/23/22 167 lb (75.8 kg)     GEN:  Well nourished, well developed in no acute distress HEENT: Normal NECK: No JVD; No carotid bruits CARDIAC: RRR, 2/6 systolic murmur RESPIRATORY:  Clear to auscultation without rales, wheezing or rhonchi  ABDOMEN: Soft, non-tender, non-distended MUSCULOSKELETAL:  No edema; No deformity  SKIN: Warm and dry NEUROLOGIC:  Alert and oriented x 3 PSYCHIATRIC:  Normal affect   ASSESSMENT:    No diagnosis found.  PLAN:    In order of problems listed above:  #Chronic systolic CHF: -Patient with fluctuating EF. TTE 11/2011 with LVEF 55-60%. Dropped to 30% in 2020 in setting of Aflutter with RVR. Recovered to 60-65% in 02/2019. Dropped again to 30-35% in 11/2021 in the setting of atrial arrhythmias. Remained low on TEE in 12/2021 at 40-97% -Etiology not certain. Likely due to tachy-induced CM as valvular disease not as significant as previously thought on TEE as detailed below -Appears compensated and euvolemic today with NYHA class II symptoms -Followed by Dr. Haroldine Laws -Continue Lasix '40mg'$  daily -Continue Metoprolol XL 100 mg daily -Continue Entresto 24/26 mg BID -Continue Farxiga '10mg'$  daily -Continue spironolactone '25mg'$  daily   #CAD s/p CABG: -S/p CABG (LIMA to LAD, SVG to PDA, SVG to OM, SVG to diagonal) in 2013 -No anginal symptoms and remains active -No aspirin with anticoagulation -Continue lipitor '80mg'$   daily -Declined repeat cath given lack of symptoms and improvement of EF   #S/p mitral valve repair with mild-to-moderate MS/moderate MR: -TEE 01/19/22 with mild-to-moderate MS with mean gradient 74mHg with MVA by 3D 1.71cm2. Leaflets are mobile and likely narrowing due to small annuloplasty ring. There is moderate MR with  restricted movement of the PMVL. Will need continued monitoring. -Continue serial monitoring with echoes  #Tricuspid regurgitation: -Moderate TR on TEE in 12/2021. 3D VC 0.27cm2 -Continue serial monitor as above   #Atrial arrhythmias: -Maintaining SR on exam today -Off amiodarone due to hyperthyroidism -Continue apixaban '5mg'$  BID; okay to continue DOAC given mild-to-mod MS on TEE -Follow-up with Dr. KCaryl Comesas scheduled  #Sinus Node Dysfunction: -S/p St. Jude PPM -Follows with Dr. KCaryl Comes  #Tobacco use: -Quit smoking  04/23   #HTN:  -BP stable -GDMT as above   #HLD:  -On lipitor '80mg'$  daily       Medication Adjustments/Labs and Tests Ordered: Current medicines are reviewed at length with the patient today.  Concerns regarding medicines are outlined above.  No orders of the defined types were placed in this encounter.  No orders of the defined types were placed in this encounter.   There are no Patient Instructions on file for this visit.   Signed, HFreada Bergeron MD  08/18/2022 8:48 AM    [place]

## 2022-08-25 ENCOUNTER — Encounter: Payer: Self-pay | Admitting: Internal Medicine

## 2022-08-25 NOTE — Telephone Encounter (Signed)
Error

## 2022-08-26 NOTE — Progress Notes (Signed)
Cardiology Office Note:    Date:  08/29/2022   ID:  Ashley [name], DOB Oct 19, 1965, MRN [id number]  PCP:  [name], [place] Providers Cardiologist:  Dorris Carnes, MD Electrophysiologist:  Virl Axe, MD  {   Referring MD: [name], MD    History of Present Illness:    Ashley [name] is a 57 y.o. female with a hx of with history of CAD s/p CABG in 2013, MVP with severe MR s/p MV repair at time of CABG, chronic systolic CHF, sinus node dysfunction s/p St. Jude pacer 11/20, Hx CVA 2017 treated with tPA, atrial flutter/fibrillation, HTN, HLD, and tobacco use who was referred by Dr. Margarita Rana for follow-up of her CAD, systolic heart failure, and MVR.  Per review of the record, the patient was found to be in atrial flutter with RVR 10/2018. EF down to 30% on echo. Underwent DCCV 01/2019. She had recurrence of AF but EF had improved to 60-65% on subsequent echo 07/20. Repeat TTE 01/23 with LVEF 50-55%. Has been on amiodarone for atrial arrhythmias.   Patient was admitted on 04/23 with AT with RVR and volume overload. Attempts to pace out were unsuccessful and she underwent DCCV. She was readmitted about 10 days later with recurrent AF and volume overload. She was placed on amiodarone drip and converted to SR. Diuresed with IV lasix. Transitioned to P.O. lasxi 40 mg BID at discharge. Echo: EF 30-35%, RV moderately reduced, RVSP 58 mmHg, severe LAE, posterior leaflet mitral valve appears severely restricted, moderate to severe mitral stenosis with mean gradient 9 mmHg and moderate MR ( may not be fully visualized, thick CW doppler jet), severe TR.  Was seen by Dr. Haroldine Laws 12/2021 Was scheduled for TEE/DCCV 01/19/22. TEE showed EF 30-35%, mild-to-moderate MS with MVA 1.71cm2 by 3D planimetry/2.17cm2 by continuity with good leaflet motion (likely small ring), moderate MR due to posterior leaflet restriction, moderate TR.  Was seen by Dr. Haroldine Laws on  05/18/22 where her amiodarone was stopped by Dr. Caryl Comes due to hyperthyroidism. Was treated with prednisone x 4weeks. TTE 05/18/22 EF 40% mild to moderate MR, mild TR Personally reviewed. PPM check at that time: 61% AP. AT/AF burden 0% since 03/07/22.  Was last seen in clinic on 08/10/22 by Dr. Caryl Comes. 30 day monitor showed some v pacing ? PMT, also nonsustained VT.   Today, the patient states that she feels alright from a cardiovascular perspective. However, she is concerned about persistent bruising of her right forearm that has been present since she was in the hospital almost 1 year ago. At times the ecchymosis covers a larger area, and she sometimes notices 3 prominent varicosities closer to her right wrist. She denies any subsequent injuries to her right forearm in the past year. No other bleeding issues.  Lately she hasn't been aware of any palpitations. She has been compliant with her metoprolol and has been off the amiodarone due to hyperthyroidism. She remains active without significant anginal symptoms. No significant DOE, orthopnea or PND. Her regimen includes 40 mg Lasix in the morning. On average, she needs to take a second Lasix pill about 4 days a week.  Additionally she complains of right-sided thoracic pain which she believes is attributable to pulling a muscle. She is in the process of moving to a new home, which is keeping her very active.  She is doing well with abstaining from smoking.  She denies any shortness of breath, lightheadedness, headaches, syncope, orthopnea, or PND.  Past Medical History:  Diagnosis Date   Abdominal pain    ? chronic cholecystitis; admxn to [facility] 9/12 (CT, USN, HIDA done)   Atrial fibrillation ([place])    Cardiomyopathy    echo 9/12: mild LVH, EF 35-40%, mod MR (difficult to judge /post leaflet restricted), mod BAE, mod RVE, mod TR    Cardiomyopathy ([place])    a. echo 9/12: mild LVH, EF 35-40%, mod MR (difficult to judge /post leaflet restricted), mod  BAE, mod RVE, mod TR. b. Echo 11/2011: mild LVH, EF 55-60%, s/p MV repair w/o sig MR/MS, mildly dilated RV/RA, PASP 66mHg. // AFL dx 10/2018 - Echo 10/2018: EF 30, diff HK, s/p MV repair with trivial MR, mean MV 7 but not c/w significant mitral stenosis, mild RAE, severe LAE, mod reduced RVSF    Carotid artery disease ([place]    a. Duplex 06/2015: 50% RECA, 1-39% BICA.   CHF (congestive heart failure) (HCC)    Clotting disorder (HCC)    Coronary artery disease    a. s/p CABG 10/2011 - LIMA-LAD, SVG-PDA, SVG-OM, SVG-diagonal.   Dyslipidemia    Essential hypertension    Heart murmur    Hemifacial spasm    History of Doppler ultrasound    a[facility] [place] [place] [facility]: no hemodynamically significant ICA stenosis   Hyperlipidemia    Mitral regurgitation    a. s/p MV repair 10/2011 - on Coumadin for 3 months afterwards then d/c'd by surgery.   Recurrent upper respiratory infection (URI)    S/P CABG x 4 10/21/2011   S/P mitral valve repair 10/21/2011   Stroke ([place] 1989   a. age 57- in setting of cigs and OCPs. //  b. s/p acute L parietal CVA >> [facility] [place] [place]  [facility]roke ([facility] 01/2017   Tobacco abuse     Past Surgical History:  Procedure Laterality Date   CARDIAC CATHETERIZATION     CARDIOVERSION N/A 01/25/2019   Procedure: CARDIOVERSION;  Surgeon: HPixie Casino MD;  [facility];  Service: Cardiovascular;  Laterality: N/A;   CARDIOVERSION N/A 06/14/2019   Procedure: CARDIOVERSION;  Surgeon: HPixie Casino MD;  [facility];  Service: Cardiovascular;  Laterality: N/A;   [place]   CORONARY ARTERY BYPASS GRAFT  10/21/2011   Procedure: CORONARY ARTERY BYPASS GRAFTING (CABG);  Surgeon: CRexene Alberts MD;  [place]: [place]  Service: Open Heart Surgery;  Laterality: N/A;  cabg x four, using right leg greater saphenous vein harvested endoscopically   DENTAL SURGERY     MITRAL VALVE REPAIR   10/21/2011   Procedure: MITRAL VALVE REPAIR (MVR);  Surgeon: CRexene Alberts MD;  Location: [facility]  Service: Open Heart Surgery;  Laterality: N/A;   PACEMAKER IMPLANT N/A 06/25/2019   Procedure: PACEMAKER IMPLANT;  Surgeon: AThompson Grayer MD;  [place] [facility];  Service: Cardiovascular;  Laterality: N/A;   TEE WITHOUT CARDIOVERSION  06/30/2011   Procedure: TRANSESOPHAGEAL ECHOCARDIOGRAM (TEE);  Surgeon: PFay Records MD;  [facility];  Service: Cardiovascular;  Laterality: N/A;   TEE WITHOUT CARDIOVERSION N/A 01/19/2022   Procedure: TRANSESOPHAGEAL ECHOCARDIOGRAM (TEE);  Surgeon: OGeralynn Rile MD;  [place]  Service: Cardiovascular;  Laterality: N/A;   TONSILLECTOMY     TUBAL LIGATION      Current Medications: Current Meds  Medication Sig   acetaminophen (TYLENOL) 500 MG tablet Take 500-1,000 mg  by mouth every 6 (six) hours as needed for headache.   apixaban (ELIQUIS) 5 MG TABS tablet Take 1 tablet (5 mg total) by mouth 2 (two) times daily.   Ascorbic Acid (VITAMIN C) 1000 MG tablet Take 1,000 mg by mouth 2 (two) times daily.    atorvastatin (LIPITOR) 80 MG tablet TAKE ONE TABLET BY MOUTH ONE TIME DAILY   dapagliflozin propanediol (FARXIGA) 10 MG TABS tablet Take 1 tablet (10 mg total) by mouth daily before breakfast.   diclofenac Sodium (VOLTAREN ARTHRITIS PAIN) 1 % GEL Apply 2 g topically 4 (four) times daily.   diphenhydrAMINE (BENADRYL) 25 MG tablet Take 25 mg by mouth every 6 (six) hours as needed for allergies.   famotidine (PEPCID) 20 MG tablet Take 20 mg by mouth daily as needed for indigestion.    ferrous sulfate 325 (65 FE) MG tablet Take 1 tablet (325 mg total) by mouth daily with breakfast.   furosemide (LASIX) 40 MG tablet TAKE ONE TABLET BY MOUTH ONE TIME DAILY TAKE EXTRA TABLET AT NIGHT AS NEEDED   Homeopathic Products (ARNICARE ARNICA EX) Apply 1 application. topically as needed (bruises).   lidocaine (LIDODERM) 5 % Place 1  patch onto the skin daily. Remove & Discard patch within 12 hours or as directed by MD   metoprolol succinate (TOPROL-XL) 100 MG 24 hr tablet Take 1 tablet (100 mg total) by mouth daily. Take with or immediately following a meal.   sacubitril-valsartan (ENTRESTO) 49-51 MG Take 1 tablet by mouth 2 (two) times daily.   sodium chloride (OCEAN) 0.65 % SOLN nasal spray Place 1 spray into both nostrils daily as needed (for congestion).   spironolactone (ALDACTONE) 25 MG tablet Take 1 tablet (25 mg total) by mouth daily.   vitamin B-12 (CYANOCOBALAMIN) 1000 MCG tablet Take 1 tablet (1,000 mcg total) by mouth daily.   [DISCONTINUED] methocarbamol (ROBAXIN) 500 MG tablet Take 2 tablets (1,000 mg total) by mouth every 8 (eight) hours as needed for muscle spasms.   Current Facility-Administered Medications for the 08/29/22 encounter (Office Visit) with Freada Bergeron, MD  Medication   incobotulinumtoxinA (XEOMIN) 50 units injection 50 Units   incobotulinumtoxinA (XEOMIN) 50 units injection 50 Units   incobotulinumtoxinA (XEOMIN) 50 units injection 50 Units     Allergies:   Azithromycin, Latex, Spinach, and Tape   Social History   Socioeconomic History   Marital status: Widowed    Spouse name: Not on file   Number of children: 2   Years of education: Not on file   Highest education level: Bachelor's degree (e.g., BA, AB, BS)  Occupational History   Occupation: Payroll/Paperwork    Comment: 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   Occupation: Disability    Comment: since march 2023  Tobacco Use   Smoking status: Former    Packs/day: 1.00    Years: 30.00    Total pack years: 30.00    Types: Cigarettes    Quit date: 10/2021    Years since quitting: 0.8   Smokeless tobacco: Never  Vaping Use   Vaping Use: Never used  Substance and Sexual Activity   Alcohol use: Yes    Alcohol/week: 1.0 standard drink of alcohol    Types: 1 Cans of beer per week    Comment: can of beer 1 x week   Drug  use: No   Sexual activity: Yes  Other Topics Concern   Not on file  Social History Narrative   Lives at home with husband and  son.   Right-handed.   2 cups caffeine daily.   Social Determinants of Health   Financial Resource Strain: Not on file  Food Insecurity: No Food Insecurity (12/21/2021)   Hunger Vital Sign    Worried About Running Out of Food in the Last Year: Never true    Ran Out of Food in the Last Year: Never true  Transportation Needs: No Transportation Needs (12/21/2021)   PRAPARE - Hydrologist (Medical): No    Lack of Transportation (Non-Medical): No  Physical Activity: Not on file  Stress: Not on file  Social Connections: Not on file     Family History: The patient's family history includes Bone cancer in her maternal grandmother; COPD in her father; Heart disease in her father; Hypertension in her mother; Stroke in her mother. There is no history of Anesthesia problems, Hypotension, Malignant hyperthermia, Pseudochol deficiency, or Colon cancer.  ROS:   Review of Systems  Constitutional:  Negative for chills and fever.  HENT:  Negative for nosebleeds and tinnitus.   Eyes:  Negative for blurred vision and pain.  Respiratory:  Negative for cough, hemoptysis, shortness of breath and stridor.   Cardiovascular:  Negative for chest pain, palpitations, orthopnea, claudication, leg swelling and PND.  Gastrointestinal:  Negative for blood in stool, diarrhea, nausea and vomiting.  Genitourinary:  Negative for dysuria and hematuria.  Musculoskeletal:  Positive for myalgias (Bilateral foot pain). Negative for falls.  Neurological:  Negative for dizziness, loss of consciousness and headaches.  Endo/Heme/Allergies:  Bruises/bleeds easily (Persistent bruising of right forearm).  Psychiatric/Behavioral:  Negative for depression, hallucinations and substance abuse. The patient does not have insomnia.      EKGs/Labs/Other Studies Reviewed:    The  following studies were reviewed today:  Cardiac Monitor 06/2022: Findings HR  avg 68  Min 58-Max 111  PVCs 2% PACs 3% AFib none  VT Nonsustained 13 episodes; fastest 145 bpm for 7 beats; longest for 11.5 secs at 123 bpm    Symptoms:  none>   Triggered: none              Conclusions: VT nonsustained may be responsible for symptomatic  palpitaitons but no symptoms noted   Recommendations Continue current therapy    TTE 04/2022: IMPRESSIONS   1. Left ventricular ejection fraction, by estimation, is 40%. The left  ventricle has moderately decreased function. The left ventricle  demonstrates global hypokinesis. Left ventricular diastolic parameters are  indeterminate.   2. Right ventricular systolic function is hyperdynamic. The right  ventricular size is normal. There is normal pulmonary artery systolic  pressure.   3. Left atrial size was moderately dilated.   4. The mitral valve has been repaired/replaced. Mild to moderate mitral  valve regurgitation. No evidence of mitral stenosis. The mean mitral valve  gradient is 4.0 mmHg. Moderate mitral annular calcification.   5. The aortic valve is normal in structure. Aortic valve regurgitation is  not visualized. Aortic valve sclerosis/calcification is present, without  any evidence of aortic stenosis.   6. The inferior vena cava is normal in size with greater than 50%  respiratory variability, suggesting right atrial pressure of 3 mmHg.   TEE 12/2021: IMPRESSIONS   1. 26 mm annuloplasty ring. MG 4.0 mmHG @ 61 bpm. Mild to moderate mitral  stenosis is present likely related to the small annuloplasty ring.  Leaflets are mobile. MVA by directly 3D planimetry 1.71 cm2. MVA by VTI  2.17 cm2. Moderate mitral regurgitation  is present likely related to restricted movement of the PMVL. 3D VCA 0.21  cm2. The mitral valve has been repaired/replaced. Moderate mitral valve  regurgitation. Mild to moderate mitral stenosis. There is a 26 mm  RING  MITRAL MEMO 3D 26MM prosthetic  annuloplasty ring present in the mitral position. Procedure Date:  10/21/2011.   2. Left ventricular ejection fraction, by estimation, is 30 to 35%. The  left ventricle has moderately decreased function. The left ventricle  demonstrates global hypokinesis. The left ventricular internal cavity size  was mildly dilated.   3. Right ventricular systolic function is moderately reduced. The right  ventricular size is severely enlarged. There is moderately elevated  pulmonary artery systolic pressure.   4. Left atrial size was severely dilated. No left atrial/left atrial  appendage thrombus was detected. The LAA emptying velocity was 23 cm/s.   5. Right atrial size was severely dilated.   6. Moderate tricuspid regurgitation. 3D VCA 0.27 cm2. Tricuspid valve  regurgitation is moderate.   7. The aortic valve is tricuspid. Aortic valve regurgitation is not  visualized. No aortic stenosis is present.   8. There is Moderate (Grade III) layered plaque involving the descending  aorta.  EKG:  EKG is personally reviewed.  08/29/2022:  EKG was not ordered. 05/23/2022 Tommye Standard, PA-C):  AP/VS 60bpm   Recent Labs: 05/18/2022: ALT 28; B Natriuretic Peptide 387.9; BUN 21; Creatinine, Ser 1.46; Hemoglobin 14.2; Platelets 202; Potassium 4.8; Sodium 142; TSH 0.124   Recent Lipid Panel    Component Value Date/Time   CHOL 114 11/16/2017 0838   TRIG 68 11/16/2017 0838   HDL 43 11/16/2017 0838   CHOLHDL 2.7 11/16/2017 0838   CHOLHDL 3.8 07/01/2016 0939   VLDL 25 07/01/2016 0939   LDLCALC 57 11/16/2017 0838     Risk Assessment/Calculations:    CHA2DS2-VASc Score = 6  This indicates a 9.7% annual risk of stroke. The patient's score is based upon: CHF History: 1 HTN History: 1 Diabetes History: 0 Stroke History: 2 Vascular Disease History: 1 Age Score: 0 Gender Score: 1       Physical Exam:    VS:  BP 136/72   Pulse 60   Ht '5\' 8"'$  (1.727 m)   Wt 172  lb 12.8 oz (78.4 kg)   LMP 05/18/2015 Comment: went for 3 week after skipping two months   SpO2 99%   BMI 26.27 kg/m     Wt Readings from Last 3 Encounters:  08/29/22 172 lb 12.8 oz (78.4 kg)  08/10/22 169 lb 12.8 oz (77 kg)  07/20/22 171 lb (77.6 kg)     GEN:  Well nourished, well developed in no acute distress HEENT: Normal NECK: No JVD; No carotid bruits CARDIAC: RRR, 2/6 systolic murmur RESPIRATORY:  Clear to auscultation without rales, wheezing or rhonchi  ABDOMEN: Soft, non-tender, non-distended MUSCULOSKELETAL:  No edema; No deformity  SKIN: Warm and dry. Persistent ecchymosis of right forearm. NEUROLOGIC:  Alert and oriented x 3 PSYCHIATRIC:  Normal affect   ASSESSMENT:    1. Chronic systolic heart failure ([place])   2. NICM (nonischemic cardiomyopathy) ([place])   3. Coronary artery disease involving native coronary artery of native heart with other form of angina pectoris ([place])   4. 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 Medication management   6. Chronic systolic CHF (congestive heart failure) ([place])   7. Dyslipidemia   8. SOB (shortness of breath)   9. Cardiac pacemaker in situ   10. Paroxysmal atrial fibrillation (  [place])   11. PVC's (premature ventricular contractions)   12. Sick sinus syndrome ([place])   13. History of mitral valve repair     PLAN:    In order of problems listed above:  #Chronic systolic CHF: -Patient with fluctuating EF. TTE 11/2011 with LVEF 55-60%. Dropped to 30% in 2020 in setting of Aflutter with RVR. Recovered to 60-65% in 02/2019. Dropped again to 30-35% in 11/2021 in the setting of atrial arrhythmias. Remained low on TEE in 12/2021 at 30-35% and stable at 40% in 40/9811 -Etiology not certain. Likely due to tachy-induced CM as valvular disease not as significant as previously thought on TEE as detailed below -Will check cardiac PET to assess for ischemia give EF remains 40% -Appears compensated and euvolemic today with NYHA class II  symptoms -Followed by Dr. Haroldine Laws -Continue Lasix '40mg'$  daily with an extra dose as needed -Continue Metoprolol XL 100 mg daily -Continue Entresto 49/51 mg BID -Continue Farxiga '10mg'$  daily -Continue spironolactone '25mg'$  daily   #CAD s/p CABG: -S/p CABG (LIMA to LAD, SVG to PDA, SVG to OM, SVG to diagonal) in 2013 -No aspirin with anticoagulation -Continue lipitor '80mg'$  daily -Will check cardiac PET due to persistently low EF and known CAD   #S/p mitral valve repair with mild-to-moderate MS/moderate MR: -TEE 01/19/22 with mild-to-moderate MS with mean gradient 46mHg with MVA by 3D 1.71cm2. Leaflets are mobile and likely narrowing due to small annuloplasty ring. There is moderate MR with restricted movement of the PMVL. Will need continued monitoring. -Continue serial monitoring with echoes  #Tricuspid regurgitation: -Moderate TR on TEE in 12/2021. 3D VC 0.27cm2 -Continue serial monitor as above   #Atrial arrhythmias: -Follows with Dr. KCaryl Comes-Off amiodarone due to hyperthyroidism -Continue apixaban '5mg'$  BID; okay to continue DOAC given mild-to-mod MS on TEE  #Sinus Node Dysfunction: -S/p St. Jude PPM -Follows with Dr. KCaryl Comes #Hyperthyroidism: -Secondary to amiodarone which has since been stopped -Check TSH and T4 today   #Tobacco use: -Quit smoking  04/23   #HTN:  -BP stable -GDMT as above   #HLD:  -On lipitor '80mg'$  daily -Repeat lipids today (not fasting)       Follow-up:  6 months  Medication Adjustments/Labs and Tests Ordered: Current medicines are reviewed at length with the patient today.  Concerns regarding medicines are outlined above.   Orders Placed This Encounter  Procedures   NM PET CT CARDIAC PERFUSION MULTI W/ABSOLUTE BLOODFLOW   TSH   T4, free   Lipid Profile   No orders of the defined types were placed in this encounter.  Patient Instructions  Medication Instructions:   Your physician recommends that you continue on your current medications  as directed. Please refer to the Current Medication list given to you today.  *If you need a refill on your cardiac medications before your next appointment, please call your pharmacy*   Lab Work:  TODAY--LIPIDS, TSH, AND FREE T4  If you have labs (blood work) drawn today and your tests are completely normal, you will receive your results only by: [place](if you have MyChart) OR A paper copy in the mail If you have any lab test that is abnormal or we need to change your treatment, we will call you to review the results.   Testing/Procedures:  How to Prepare for Your Cardiac PET/CT Stress Test:  1. Please do not take these medications before your test:   Medications that may interfere with the cardiac pharmacological stress agent (ex. nitrates - including erectile  dysfunction medications, isosorbide mononitrate or beta-blockers) the day of the exam. (Erectile dysfunction medication should be held for at least 72 hrs prior to test) HOLD YOUR METOPROLOL THE MORNING OF THIS TEST Your remaining medications may be taken with water.  2. Nothing to eat or drink, except water, 3 hours prior to arrival time.   NO caffeine/decaffeinated products, or chocolate 12 hours prior to arrival.  3. NO perfume, cologne or lotion  4. Total time is 1 to 2 hours; you may want to bring reading material for the waiting time.  5. Please report to Admitting at the [place] Entrance 30 minutes early for your test.  [address]   IF YOU THINK YOU MAY BE PREGNANT, OR ARE NURSING PLEASE INFORM THE TECHNOLOGIST.  In preparation for your appointment, medication and supplies will be purchased.  Appointment availability is limited, so if you need to cancel or reschedule, please call the Radiology Department at [phone]  24 hours in advance to avoid a cancellation fee of $100.00  What to Expect After you Arrive:  Once you arrive and check in for your appointment, you will  be taken to a preparation room within the Radiology Department.  A technologist or Nurse will obtain your medical history, verify that you are correctly prepped for the exam, and explain the procedure.  Afterwards,  an IV will be started in your arm and electrodes will be placed on your skin for EKG monitoring during the stress portion of the exam. Then you will be escorted to the PET/CT scanner.  There, staff will get you positioned on the scanner and obtain a blood pressure and EKG.  During the exam, you will continue to be connected to the EKG and blood pressure machines.  A small, safe amount of a radioactive tracer will be injected in your IV to obtain a series of pictures of your heart along with an injection of a stress agent.    After your Exam:  It is recommended that you eat a meal and drink a caffeinated beverage to counter act any effects of the stress agent.  Drink plenty of fluids for the remainder of the day and urinate frequently for the first couple of hours after the exam.  Your doctor will inform you of your test results within 7-10 business days.  For questions about your test or how to prepare for your test, please call: Marchia Bond, Cardiac Imaging Nurse Navigator  Gordy Clement, Cardiac Imaging Nurse Navigator Office: [phone]    Follow-Up: At [facility], you and your health needs are our priority.  As part of our continuing mission to provide you with exceptional heart care, we have created designated Provider Care Teams.  These Care Teams include your primary Cardiologist (physician) and Advanced Practice Providers (APPs -  Physician Assistants and Nurse Practitioners) who all work together to provide you with the care you need, when you need it.  We recommend signing up for the patient portal called "MyChart".  Sign up information is provided on this After Visit Summary.  MyChart is used to connect with patients for Virtual Visits (Telemedicine).  Patients  are able to view lab/test results, encounter notes, upcoming appointments, etc.  Non-urgent messages can be sent to your provider as well.   To learn more about what you can do with MyChart, go to [email or web address].    Your next appointment:   6 month(s)  The format for your next appointment:   In  Person  Provider:   DR. Johney Frame   Important Information About Sugar         I,Mathew Stumpf,acting as a scribe for Freada Bergeron, MD.,have documented all relevant documentation on the behalf of Freada Bergeron, MD,as directed by  Freada Bergeron, MD while in the presence of Freada Bergeron, MD.  I, Freada Bergeron, MD, have reviewed all documentation for this visit. The documentation on 08/29/22 for the exam, diagnosis, procedures, and orders are all accurate and complete.   Signed, Freada Bergeron, MD  08/29/2022 9:28 AM    [place] [place]

## 2022-08-29 ENCOUNTER — Other Ambulatory Visit: Payer: Self-pay

## 2022-08-29 ENCOUNTER — Encounter: Payer: Self-pay | Admitting: Cardiology

## 2022-08-29 ENCOUNTER — Other Ambulatory Visit: Payer: Self-pay | Admitting: Pharmacist

## 2022-08-29 ENCOUNTER — Ambulatory Visit: Payer: Medicaid Other | Attending: Cardiology | Admitting: Cardiology

## 2022-08-29 VITALS — BP 136/72 | HR 60 | Ht 68.0 in | Wt 172.8 lb

## 2022-08-29 DIAGNOSIS — Z950 Presence of cardiac pacemaker: Secondary | ICD-10-CM

## 2022-08-29 DIAGNOSIS — T462X5A Adverse effect of other antidysrhythmic drugs, initial encounter: Secondary | ICD-10-CM

## 2022-08-29 DIAGNOSIS — I495 Sick sinus syndrome: Secondary | ICD-10-CM | POA: Diagnosis not present

## 2022-08-29 DIAGNOSIS — M549 Dorsalgia, unspecified: Secondary | ICD-10-CM

## 2022-08-29 DIAGNOSIS — R0602 Shortness of breath: Secondary | ICD-10-CM

## 2022-08-29 DIAGNOSIS — I480 Paroxysmal atrial fibrillation: Secondary | ICD-10-CM

## 2022-08-29 DIAGNOSIS — Z79899 Other long term (current) drug therapy: Secondary | ICD-10-CM | POA: Diagnosis not present

## 2022-08-29 DIAGNOSIS — Z9889 Other specified postprocedural states: Secondary | ICD-10-CM

## 2022-08-29 DIAGNOSIS — M62838 Other muscle spasm: Secondary | ICD-10-CM

## 2022-08-29 DIAGNOSIS — I428 Other cardiomyopathies: Secondary | ICD-10-CM | POA: Diagnosis not present

## 2022-08-29 DIAGNOSIS — I5022 Chronic systolic (congestive) heart failure: Secondary | ICD-10-CM

## 2022-08-29 DIAGNOSIS — I25118 Atherosclerotic heart disease of native coronary artery with other forms of angina pectoris: Secondary | ICD-10-CM | POA: Diagnosis not present

## 2022-08-29 DIAGNOSIS — I493 Ventricular premature depolarization: Secondary | ICD-10-CM

## 2022-08-29 DIAGNOSIS — E0580 Other thyrotoxicosis without thyrotoxic crisis or storm: Secondary | ICD-10-CM

## 2022-08-29 DIAGNOSIS — E785 Hyperlipidemia, unspecified: Secondary | ICD-10-CM

## 2022-08-29 LAB — LIPID PANEL
Chol/HDL Ratio: 2.8 ratio (ref 0.0–4.4)
Cholesterol, Total: 131 mg/dL (ref 100–199)
HDL: 47 mg/dL (ref >39)
LDL Chol Calc (NIH): 68 mg/dL (ref 0–99)
Triglycerides: 83 mg/dL (ref 0–149)
VLDL Cholesterol Cal: 16 mg/dL (ref 5–40)

## 2022-08-29 LAB — TSH: TSH: 2.92 u[IU]/mL (ref 0.450–4.500)

## 2022-08-29 LAB — T4, FREE: Free T4: 1.4 ng/dL (ref 0.82–1.77)

## 2022-08-29 MED ORDER — METHOCARBAMOL 500 MG PO TABS
1000.0000 mg | ORAL_TABLET | Freq: Three times a day (TID) | ORAL | 0 refills | Status: DC | PRN
  Filled 2022-08-29: qty 84, 14d supply, fill #0

## 2022-08-29 MED ORDER — METHOCARBAMOL 500 MG PO TABS: 1000.0000 mg | ORAL_TABLET | Freq: Three times a day (TID) | ORAL | 0 refills | Status: DC | PRN

## 2022-08-29 NOTE — Patient Instructions (Addendum)
Medication Instructions:   Your physician recommends that you continue on your current medications as directed. Please refer to the Current Medication list given to you today.  *If you need a refill on your cardiac medications before your next appointment, please call your pharmacy*   Lab Work:  TODAY--LIPIDS, TSH, AND FREE T4  If you have labs (blood work) drawn today and your tests are completely normal, you will receive your results only by: [place] (if you have MyChart) OR A paper copy in the mail If you have any lab test that is abnormal or we need to change your treatment, we will call you to review the results.   Testing/Procedures:  How to Prepare for Your Cardiac PET/CT Stress Test:  1. Please do not take these medications before your test:   Medications that may interfere with the cardiac pharmacological stress agent (ex. nitrates - including erectile dysfunction medications, isosorbide mononitrate or beta-blockers) the day of the exam. (Erectile dysfunction medication should be held for at least 72 hrs prior to test) HOLD YOUR METOPROLOL THE MORNING OF THIS TEST Your remaining medications may be taken with water.  2. Nothing to eat or drink, except water, 3 hours prior to arrival time.   NO caffeine/decaffeinated products, or chocolate 12 hours prior to arrival.  3. NO perfume, cologne or lotion  4. Total time is 1 to 2 hours; you may want to bring reading material for the waiting time.  5. Please report to Admitting at the [place] Entrance 30 minutes early for your test.  [address]   IF YOU THINK YOU MAY BE PREGNANT, OR ARE NURSING PLEASE INFORM THE TECHNOLOGIST.  In preparation for your appointment, medication and supplies will be purchased.  Appointment availability is limited, so if you need to cancel or reschedule, please call the Radiology Department at [phone]  24 hours in advance to avoid a cancellation fee of  $100.00  What to Expect After you Arrive:  Once you arrive and check in for your appointment, you will be taken to a preparation room within the Radiology Department.  A technologist or Nurse will obtain your medical history, verify that you are correctly prepped for the exam, and explain the procedure.  Afterwards,  an IV will be started in your arm and electrodes will be placed on your skin for EKG monitoring during the stress portion of the exam. Then you will be escorted to the PET/CT scanner.  There, staff will get you positioned on the scanner and obtain a blood pressure and EKG.  During the exam, you will continue to be connected to the EKG and blood pressure machines.  A small, safe amount of a radioactive tracer will be injected in your IV to obtain a series of pictures of your heart along with an injection of a stress agent.    After your Exam:  It is recommended that you eat a meal and drink a caffeinated beverage to counter act any effects of the stress agent.  Drink plenty of fluids for the remainder of the day and urinate frequently for the first couple of hours after the exam.  Your doctor will inform you of your test results within 7-10 business days.  For questions about your test or how to prepare for your test, please call: Marchia Bond, Cardiac Imaging Nurse Navigator  Gordy Clement, Cardiac Imaging Nurse Navigator Office: [phone]    Follow-Up: At [facility], you and your health needs  are our priority.  As part of our continuing mission to provide you with exceptional heart care, we have created designated Provider Care Teams.  These Care Teams include your primary Cardiologist (physician) and Advanced Practice Providers (APPs -  Physician Assistants and Nurse Practitioners) who all work together to provide you with the care you need, when you need it.  We recommend signing up for the patient portal called "MyChart".  Sign up information is provided on this  After Visit Summary.  MyChart is used to connect with patients for Virtual Visits (Telemedicine).  Patients are able to view lab/test results, encounter notes, upcoming appointments, etc.  Non-urgent messages can be sent to your provider as well.   To learn more about what you can do with MyChart, go to [email or web address].    Your next appointment:   6 month(s)  The format for your next appointment:   In Person  Provider:   DR. Johney Frame   Important Information About [place]

## 2022-09-20 ENCOUNTER — Ambulatory Visit: Payer: Medicaid Other

## 2022-09-20 DIAGNOSIS — I428 Other cardiomyopathies: Secondary | ICD-10-CM | POA: Diagnosis not present

## 2022-09-20 LAB — CUP PACEART REMOTE DEVICE CHECK
Battery Remaining Longevity: 80 mo
Battery Remaining Percentage: 71 %
Battery Voltage: 3.01 V
Brady Statistic AP VP Percent: 1 %
Brady Statistic AP VS Percent: 73 %
Brady Statistic AS VP Percent: 1.3 %
Brady Statistic AS VS Percent: 23 %
Brady Statistic RA Percent Paced: 70 %
Brady Statistic RV Percent Paced: 1.6 %
Date Time Interrogation Session: 20240130020014
Implantable Lead Connection Status: 753985
Implantable Lead Connection Status: 753985
Implantable Lead Implant Date: 20201103
Implantable Lead Implant Date: 20201103
Implantable Lead Location: 753859
Implantable Lead Location: 753860
Implantable Pulse Generator Implant Date: 20201103
Lead Channel Impedance Value: 450 Ohm
Lead Channel Impedance Value: 600 Ohm
Lead Channel Pacing Threshold Amplitude: 0.75 V
Lead Channel Pacing Threshold Amplitude: 1 V
Lead Channel Pacing Threshold Pulse Width: 0.5 ms
Lead Channel Pacing Threshold Pulse Width: 0.5 ms
Lead Channel Sensing Intrinsic Amplitude: 12 mV
Lead Channel Sensing Intrinsic Amplitude: 5 mV
Lead Channel Setting Pacing Amplitude: 1.5 V
Lead Channel Setting Pacing Amplitude: 2.5 V
Lead Channel Setting Pacing Pulse Width: 0.5 ms
Lead Channel Setting Sensing Sensitivity: 2 mV
Pulse Gen Model: 2272
Pulse Gen Serial Number: 9174760

## 2022-10-11 ENCOUNTER — Other Ambulatory Visit (HOSPITAL_COMMUNITY): Payer: Self-pay | Admitting: Physician Assistant

## 2022-10-18 NOTE — Progress Notes (Signed)
Remote pacemaker transmission.   

## 2022-10-20 ENCOUNTER — Ambulatory Visit: Payer: Medicaid Other | Admitting: Family Medicine

## 2022-10-20 ENCOUNTER — Encounter: Payer: Self-pay | Admitting: Physician Assistant

## 2022-10-20 ENCOUNTER — Ambulatory Visit: Payer: Medicaid Other | Attending: Family Medicine | Admitting: Physician Assistant

## 2022-10-20 VITALS — BP 140/80 | HR 59 | Ht 68.5 in | Wt 175.2 lb

## 2022-10-20 DIAGNOSIS — M549 Dorsalgia, unspecified: Secondary | ICD-10-CM | POA: Diagnosis not present

## 2022-10-20 DIAGNOSIS — M62838 Other muscle spasm: Secondary | ICD-10-CM

## 2022-10-20 DIAGNOSIS — I5042 Chronic combined systolic (congestive) and diastolic (congestive) heart failure: Secondary | ICD-10-CM | POA: Diagnosis not present

## 2022-10-20 DIAGNOSIS — I4819 Other persistent atrial fibrillation: Secondary | ICD-10-CM | POA: Diagnosis not present

## 2022-10-20 DIAGNOSIS — G5131 Clonic hemifacial spasm, right: Secondary | ICD-10-CM | POA: Diagnosis not present

## 2022-10-20 DIAGNOSIS — I10 Essential (primary) hypertension: Secondary | ICD-10-CM

## 2022-10-20 MED ORDER — METHOCARBAMOL 500 MG PO TABS: 1000.0000 mg | ORAL_TABLET | Freq: Three times a day (TID) | ORAL | 0 refills | Status: DC | PRN

## 2022-10-20 MED ORDER — APIXABAN 5 MG PO TABS: 5.0000 mg | ORAL_TABLET | Freq: Two times a day (BID) | ORAL | 3 refills | Status: DC

## 2022-10-20 MED ORDER — DAPAGLIFLOZIN PROPANEDIOL 10 MG PO TABS: 10.0000 mg | ORAL_TABLET | Freq: Every day | ORAL | 3 refills | Status: DC

## 2022-10-20 MED ORDER — METOPROLOL SUCCINATE ER 100 MG PO TB24: 100.0000 mg | ORAL_TABLET | Freq: Every day | ORAL | 3 refills | Status: DC

## 2022-10-20 MED ORDER — FERROUS SULFATE 325 (65 FE) MG PO TABS: 325.0000 mg | ORAL_TABLET | Freq: Every day | ORAL | 3 refills | Status: DC

## 2022-10-20 NOTE — Progress Notes (Signed)
Patient ID: Ashley [name], female   DOB: [birth year], 57 y.o.   MRN: [id number]   [name], is a 57 y.o. female  [id number]  [id number]  DOB - 05/19/1966  Chief Complaint  Patient presents with   Referral       Subjective:   Ashley [name] is a 57 y.o. female here today for med RF.  She does have some white coat hypertension.  BP at home usu120-130/70s-80.  No HA/CP/dizziness.  She is taking all of her medications, but she does need RF on some of them.    She used to get botox from R hemiparesis of face s/p stroke which really helped.  She has not been able to get those for about 4 years but would like to get back on them.  Needs neuro referral for that  Also needs RF methocarbamol.  She usu takes it at bedtime prn with back pain  No problems updated.  ALLERGIES: Allergies  Allergen Reactions   Azithromycin Nausea Only   Latex Rash   Spinach Hives    Cooked    Tape Dermatitis and Other (See Comments)    PLEASE USE PAPER TAPE!! AFFECTS SKIN BADLY!!!!    PAST MEDICAL HISTORY: Past Medical History:  Diagnosis Date   Abdominal pain    ? chronic cholecystitis; admxn to [facility] 9/12 (CT, USN, HIDA done)   Atrial fibrillation ([place])    Cardiomyopathy    echo 9/12: mild LVH, EF 35-40%, mod MR (difficult to judge /post leaflet restricted), mod BAE, mod RVE, mod TR    Cardiomyopathy ([place])    a. echo 9/12: mild LVH, EF 35-40%, mod MR (difficult to judge /post leaflet restricted), mod BAE, mod RVE, mod TR. b. Echo 11/2011: mild LVH, EF 55-60%, s/p MV repair w/o sig MR/MS, mildly dilated RV/RA, PASP 8mHg. // AFL dx 10/2018 - Echo 10/2018: EF 30, diff HK, s/p MV repair with trivial MR, mean MV 7 but not c/w significant mitral stenosis, mild RAE, severe LAE, mod reduced RVSF    Carotid artery disease ([place]    a. Duplex 06/2015: 50% RECA, 1-39% BICA.   CHF (congestive heart failure) (HCC)    Clotting disorder (HCC)    Coronary artery disease    a. s/p CABG 10/2011 -  LIMA-LAD, SVG-PDA, SVG-OM, SVG-diagonal.   Dyslipidemia    Essential hypertension    Heart murmur    Hemifacial spasm    History of Doppler ultrasound    [facility] [place] [place] [facility]: no hemodynamically significant ICA stenosis   Hyperlipidemia    Mitral regurgitation    a. s/p MV repair 10/2011 - on Coumadin for 3 months afterwards then d/c'd by surgery.   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 age 89- in setting of cigs and OCPs. //  b. s/p acute L parietal CV[facility] >> [facility] [place]  [facility]roke ([facility] 01/2017   Tobacco abuse     MEDICATIONS AT HOME: Prior to Admission medications   Medication Sig Start Date End Date Taking? Authorizing Provider  acetaminophen (TYLENOL) 500 MG tablet Take 500-1,000 mg by mouth every 6 (six) hours as needed for headache.   Yes [provider]  Ascorbic Acid (VITAMIN C) 1000 MG tablet Take 1,000 mg by mouth 2 (two) times daily.    Yes  [provider]  atorvastatin (LIPITOR) 80 MG tablet TAKE ONE TABLET BY MOUTH ONE TIME DAILY 04/13/22  Yes [name], MD  diclofenac Sodium (VOLTAREN ARTHRITIS PAIN) 1 % GEL Apply 2 g topically 4 (four) times daily. 07/20/22  Yes Freeman Caldron M, PA-C  diphenhydrAMINE (BENADRYL) 25 MG tablet Take 25 mg by mouth every 6 (six) hours as needed for allergies.   Yes [provider]  famotidine (PEPCID) 20 MG tablet Take 20 mg by mouth daily as needed for indigestion.    Yes [provider]  furosemide (LASIX) 40 MG tablet TAKE ONE TABLET BY MOUTH ONE TIME DAILY; TAKE AN EXTRA TABLET AT NIGHT AS NEEDED 10/11/22  Yes Joette Catching, PA-C  Homeopathic Products (ARNICARE ARNICA EX) Apply 1 application. topically as needed (bruises).   Yes [provider]  lidocaine (LIDODERM) 5 % Place 1 patch onto the skin daily. Remove & Discard  patch within 12 hours or as directed by MD 02/24/22  Yes Henderly, Britni A, PA-C  sacubitril-valsartan (ENTRESTO) 49-51 MG Take 1 tablet by mouth 2 (two) times daily. 08/10/22  Yes [name], MD  sodium chloride (OCEAN) 0.65 % SOLN nasal spray Place 1 spray into both nostrils daily as needed (for congestion).   Yes [provider]  spironolactone (ALDACTONE) 25 MG tablet Take 1 tablet (25 mg total) by mouth daily. 05/23/22  Yes [name], MD  vitamin B-12 (CYANOCOBALAMIN) 1000 MCG tablet Take 1 tablet (1,000 mcg total) by mouth daily. 06/21/16  Yes Rosalin Hawking, MD  apixaban (ELIQUIS) 5 MG TABS tablet Take 1 tablet (5 mg total) by mouth 2 (two) times daily. 10/20/22   Argentina Donovan, PA-C  dapagliflozin propanediol (FARXIGA) 10 MG TABS tablet Take 1 tablet (10 mg total) by mouth daily before breakfast. 10/20/22   Argentina Donovan, PA-C  ferrous sulfate 325 (65 FE) MG tablet Take 1 tablet (325 mg total) by mouth daily with breakfast. 10/20/22   Argentina Donovan, PA-C  methocarbamol (ROBAXIN) 500 MG tablet Take 2 tablets (1,000 mg total) by mouth every 8 (eight) hours as needed for muscle spasms. 10/20/22   Argentina Donovan, PA-C  metoprolol succinate (TOPROL-XL) 100 MG 24 hr tablet Take 1 tablet (100 mg total) by mouth daily. Take with or immediately following a meal. 10/20/22   Kaisey Huseby, Dionne Bucy, PA-C    ROS: Neg HEENT Neg resp Neg cardiac Neg GI Neg GU Neg psych  Objective:   Vitals:   10/20/22 1120  BP: (!) 164/96  Pulse: (!) 59  SpO2: 100%  Weight: 175 lb 3.2 oz (79.5 kg)  Height: 5' 8.5" (1.74 m)   Exam General appearance : Awake, alert, not in any distress. Speech Clear. Not toxic looking HEENT: Atraumatic and Normocephalic Neck: Supple, no JVD. No cervical lymphadenopathy.  Chest: Good air entry bilaterally, CTAB.  No rales/rhonchi/wheezing CVS: S1 S2 regular, no murmurs.  Extremities: B/L Lower Ext shows no edema, both legs are warm to touch Neurology:  Awake alert, and oriented X 3, CN II-XII intact, some asymmetry of face and R eye tends to stay closed unless deliberate/straining to open Skin: No Rash  Data Review Lab Results  Component Value Date   HGBA1C 5.1 03/16/2020   HGBA1C 5.3 09/30/2019   HGBA1C 5.5 04/12/2016    Assessment & Plan   1. Muscle spasm - methocarbamol (ROBAXIN) 500 MG tablet; Take 2 tablets (1,000 mg total) by mouth every 8 (eight) hours as needed for  muscle spasms.  Dispense: 90 tablet; Refill: 0  2. Upper back pain on right side - methocarbamol (ROBAXIN) 500 MG tablet; Take 2 tablets (1,000 mg total) by mouth every 8 (eight) hours as needed for muscle spasms.  Dispense: 90 tablet; Refill: 0  3. 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 Chronic combined systolic and diastolic heart failure (HCC) - apixaban (ELIQUIS) 5 MG TABS tablet; Take 1 tablet (5 mg total) by mouth 2 (two) times daily.  Dispense: 180 tablet; Refill: 3 - dapagliflozin propanediol (FARXIGA) 10 MG TABS tablet; Take 1 tablet (10 mg total) by mouth daily before breakfast.  Dispense: 30 tablet; Refill: 3  5. Persistent atrial fibrillation (HCC) - dapagliflozin propanediol (FARXIGA) 10 MG TABS tablet; Take 1 tablet (10 mg total) by mouth daily before breakfast.  Dispense: 30 tablet; Refill: 3 - apixaban (ELIQUIS) 5 MG TABS tablet; Take 1 tablet (5 mg total) by mouth 2 (two) times daily.  Dispense: 180 tablet; Refill: 3  6. Hypertension, unspecified type - metoprolol succinate (TOPROL-XL) 100 MG 24 hr tablet; Take 1 tablet (100 mg total) by mouth daily. Take with or immediately following a meal.  Dispense: 90 tablet; Refill: 3    Return in about 4 months (around 02/18/2023) for PCP for chronic conditions.  The patient was given clear instructions to go to ER or return to medical center if symptoms don't improve, worsen or new problems develop. The patient verbalized understanding. The patient was  told to call to get lab results if they haven't heard anything in the next week.      Freeman Caldron, PA-C [facility] [place], [place]   10/20/2022, 12:16 PM

## 2022-10-28 ENCOUNTER — Telehealth: Payer: Self-pay | Admitting: Cardiology

## 2022-10-28 ENCOUNTER — Telehealth: Payer: Self-pay | Admitting: *Deleted

## 2022-10-28 ENCOUNTER — Encounter: Payer: Self-pay | Admitting: *Deleted

## 2022-10-28 ENCOUNTER — Telehealth (HOSPITAL_COMMUNITY): Payer: Self-pay | Admitting: *Deleted

## 2022-10-28 DIAGNOSIS — I5022 Chronic systolic (congestive) heart failure: Secondary | ICD-10-CM

## 2022-10-28 DIAGNOSIS — I2510 Atherosclerotic heart disease of native coronary artery without angina pectoris: Secondary | ICD-10-CM

## 2022-10-28 DIAGNOSIS — I25118 Atherosclerotic heart disease of native coronary artery with other forms of angina pectoris: Secondary | ICD-10-CM

## 2022-10-28 NOTE — Telephone Encounter (Signed)
Reaching out to patient to offer assistance regarding upcoming cardiac imaging study; pt verbalizes understanding of appt date/time, parking situation and where to check in, pre-test NPO status, and verified current allergies; name and call back number provided for further questions should they arise  Onyekachi Gathright RN Navigator Cardiac Imaging [facility] [phone] office [phone] cell  Patient aware to avoid caffeine 12 hours prior to her cardiac PET scan.  

## 2022-10-28 NOTE — Telephone Encounter (Signed)
Patient states she was denied assistance for upcoming test and would like a call back to discuss what her next would be. Please advise.

## 2022-10-28 NOTE — Telephone Encounter (Signed)
Please see other open telephone note about this issue.  We ordered for pt to get a lexiscan done instead.  Refer to other telephone note about this for further details.

## 2022-10-28 NOTE — Telephone Encounter (Signed)
Spoke with patient and she stated that she was denied the financial assistance to help pay for the PET CT. She would like to know what will be the next step?

## 2022-10-28 NOTE — Telephone Encounter (Signed)
Order for lexiscan myoview placed in the system for this pt to have done.    Pt aware that we will send her, the lexiscan instructions to her mychart account to review.   Pt verbalized understanding and agrees with this plan.  Will also place attestation order in this encounter for Dr. Johney Frame to sign off on.

## 2022-10-28 NOTE — Telephone Encounter (Signed)
-----   Message from Freada Bergeron, MD sent at 10/28/2022 12:59 PM EST ----- Regarding: RE: AUTH DENIAL What a bummer.  Calais Svehla, we can change to a regular myoview.  Thank you! ----- Message ----- From: Sharol Harness Sent: 10/28/2022  12:28 PM EST To: Lorenza Evangelist, RN; Eli Hose, RN; # Subject: Theophilus Bones                                    Good afternoon,   [employer] has denied pt's auth.  Following are the details.   We denied your request for:  [phone] - PET scan of heart to show how well blood flows through your heart muscle  and to show how well your heart muscle is pumping. The PET Scan is done while you  rest and the then while you are exercising Policy rules found at [facility] Cardiac Imaging Guidelines Section(s): Cardiac PET - Perfusion - Indications (CD 6.2) guided our decision. Here are the policy requirements your request did not meet: For [phone] Myocard imag multiple studies Your doctor told us that there is a concern related to the blood vessels that carry blood to your heart. Notes from your doctor must show one of the  following applies to you.  -Severe obesity.  -Large breasts or implants.  -Not able to exercise enough to reach a target heart rate due to a physical problem.  This finding was based on review of [employer] Cardiac Imaging Guidelines Section(s):  Cardiac PET - Perfusion - Indications (CD 6.2).  There are no options for a peer to peer discussion.  Thanks, [employer]

## 2022-10-31 ENCOUNTER — Telehealth (HOSPITAL_COMMUNITY): Payer: Self-pay | Admitting: *Deleted

## 2022-10-31 NOTE — Telephone Encounter (Signed)
Patient given detailed instructions per Myocardial Perfusion Study Information Sheet for the test on 11/01/22  Patient notified to arrive 15 minutes early and that it is imperative to arrive on time for appointment to keep from having the test rescheduled.  If you need to cancel or reschedule your appointment, please call the office within 24 hours of your appointment. . Patient verbalized understanding.Kirstie Peri

## 2022-10-31 NOTE — Telephone Encounter (Signed)
RE: LEXISCAN PER DR. Johney Frame Received: Today Ashley Guardian, LPN Patient is scheduled for 11/01/22. :)

## 2022-11-01 ENCOUNTER — Ambulatory Visit (HOSPITAL_COMMUNITY): Payer: Medicaid Other | Attending: Cardiovascular Disease

## 2022-11-01 ENCOUNTER — Encounter (HOSPITAL_COMMUNITY): Admission: RE | Admit: 2022-11-01 | Payer: Medicaid Other | Source: Ambulatory Visit

## 2022-11-01 ENCOUNTER — Encounter (HOSPITAL_COMMUNITY): Payer: Self-pay

## 2022-11-01 DIAGNOSIS — I25118 Atherosclerotic heart disease of native coronary artery with other forms of angina pectoris: Secondary | ICD-10-CM | POA: Diagnosis not present

## 2022-11-01 DIAGNOSIS — I2510 Atherosclerotic heart disease of native coronary artery without angina pectoris: Secondary | ICD-10-CM | POA: Diagnosis not present

## 2022-11-01 DIAGNOSIS — I5022 Chronic systolic (congestive) heart failure: Secondary | ICD-10-CM | POA: Insufficient documentation

## 2022-11-01 LAB — MYOCARDIAL PERFUSION IMAGING
Estimated workload: 1
Exercise duration (min): 1 min
Exercise duration (sec): 0 s
LV dias vol: 100 mL (ref 46–106)
LV sys vol: 56 mL
MPHR: 162 {beats}/min
Nuc Stress EF: 44 %
Peak HR: 64 {beats}/min
Percent HR: 39 %
Rest HR: 60 {beats}/min
Rest Nuclear Isotope Dose: 10.7 mCi
SDS: 1
SRS: 0
SSS: 1
ST Depression (mm): 0 mm
Stress Nuclear Isotope Dose: 31.4 mCi
TID: 1.02

## 2022-11-01 MED ORDER — REGADENOSON 0.4 MG/5ML IV SOLN
0.4000 mg | Freq: Once | INTRAVENOUS | Status: AC
  Administered 2022-11-01: 0.4 mg via INTRAVENOUS

## 2022-11-01 MED ORDER — TECHNETIUM TC 99M TETROFOSMIN IV KIT
31.4000 | PACK | Freq: Once | INTRAVENOUS | Status: AC | PRN
  Administered 2022-11-01: 31.4 via INTRAVENOUS

## 2022-11-01 MED ORDER — TECHNETIUM TC 99M TETROFOSMIN IV KIT
10.7000 | PACK | Freq: Once | INTRAVENOUS | Status: AC | PRN
  Administered 2022-11-01: 10.7 via INTRAVENOUS

## 2022-11-04 ENCOUNTER — Other Ambulatory Visit: Payer: Self-pay | Admitting: Internal Medicine

## 2022-11-04 DIAGNOSIS — I5042 Chronic combined systolic (congestive) and diastolic (congestive) heart failure: Secondary | ICD-10-CM

## 2022-11-04 NOTE — Telephone Encounter (Signed)
Prescription refill request for Eliquis received. Indication: afib  Last office visit:[place] 08/29/2022 Scr: 1.46,05/18/2022 Age: 57 yo  Weight: 79.4 kg   Refill sent.

## 2022-11-14 NOTE — Progress Notes (Incomplete)
ADVANCED HEART FAILURE CLINIC NOTE   Primary Care: Dr. Margarita Rana Primary Cardiologist: Establishing with Dr. Johney Frame HF Dr. Haroldine Laws   HPI: 57 y.o. female with history of CAD s/p CABG in 2013, MVP with severe MR s/p MV repair at time of CABG, chronic systolic CHF, sinus node dysfunction s/p 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   Found to be in atrial flutter with RVR 03/20. EF down to 30% on echo. Underwent DCCV 0620. She had recurrence of AF but EF had improved to 60-65% on subsequent echo 07/20.   Echo 01/23: EF 50-55%  Has been on amiodarone for atrial arrhythmias. Amiodarone increased 03/23 d/t atrial tachycardia.   Admitted 4/23 with AT with RVR and volume overload. Attempts to pace out were unsuccessful and she underwent DCCV. She was readmitted about 10 days later with recurrent AF and volume overload. Treated with IV amio. Echo: EF 30-35%, RV moderately reduced, RVSP 58 mmHg, severe LAE, moderate to severe mitral stenosis with mean gradient 9 mmHg and moderate MR, severe TR  TEE 01/19/22: EF 30-35%, , RV severely enlarged with moderately reduced function, severe LAE, mild mitral stenosis with MG 4 mmHg across mitral valve (likely related to small ring), moderate MR, RVSP 58 mmHg, moderate TR  Saw Dr. Caryl Comes in 7/23 amio stopped due to hyperthyroidism. Treated with prednisone x 4 weeks.   Echo  05/18/22 EF 40% mild to moderate MR, mild TR  Here for f/u. Feels pretty good. Denies palpitations. But son says he can see her heart racing in her neck and then he takes her pulse and it will be 120-140. Tends to last 1-6 hours and goes back down to 90. She doesn't feel it. Able to do ADLs. No orthopnea or PND. BP ok.   PM check in clinic today: 61% AP. AT/AF burden 0% since 03/07/22   Review of Systems: Cardiac and Respiratory. Negative except as mentioned in HPI   Past Medical History:  Diagnosis Date   Abdominal  pain    ? chronic cholecystitis; admxn to [facility] 9/12 (CT, USN, HIDA done)   Atrial fibrillation ([place])    Cardiomyopathy    echo 9/12: mild LVH, EF 35-40%, mod MR (difficult to judge /post leaflet restricted), mod BAE, mod RVE, mod TR    Cardiomyopathy ([place])    a. echo 9/12: mild LVH, EF 35-40%, mod MR (difficult to judge /post leaflet restricted), mod BAE, mod RVE, mod TR. b. Echo 11/2011: mild LVH, EF 55-60%, s/p MV repair w/o sig MR/MS, mildly dilated RV/RA, PASP 59mmHg. // AFL dx 10/2018 - Echo 10/2018: EF 30, diff HK, s/p MV repair with trivial MR, mean MV 7 but not c/w significant mitral stenosis, mild RAE, severe LAE, mod reduced RVSF    Carotid artery disease ([place])    a. Duplex 06/2015: 50% RECA, 1-39% BICA.   CHF (congestive heart failure) (HCC)    Clotting disorder (HCC)    Coronary artery disease    a. s/p CABG 10/2011 - LIMA-LAD, SVG-PDA, SVG-OM, SVG-diagonal.   Dyslipidemia    Essential hypertension    Heart murmur    Hemifacial spasm    History of Doppler ultrasound    a. Carotid US [facility] in [place], [name]) 7/17: no hemodynamically significant ICA stenosis   Hyperlipidemia    Mitral regurgitation    a. s/p MV repair 10/2011 - on Coumadin for 3 months afterwards then d/c'd by surgery.  Recurrent upper respiratory infection (URI)    S/P CABG x 4 10/21/2011   S/P mitral valve repair 10/21/2011   Stroke ([place]) 1989   a. age 73 - in setting of cigs and OCPs. //  b. s/p acute L parietal CVA >> tPA - [facility] in [place], [name]   Stroke [facility]) 01/2017   Tobacco abuse     Current Outpatient Medications  Medication Sig Dispense Refill   acetaminophen (TYLENOL) 500 MG tablet Take 500-1,000 mg by mouth every 6 (six) hours as needed for headache.     apixaban (ELIQUIS) 5 MG TABS tablet TAKE ONE TABLET BY MOUTH TWICE A DAY 180 tablet 1   Ascorbic Acid (VITAMIN C) 1000 MG tablet Take 1,000 mg by mouth 2 (two) times daily.      atorvastatin (LIPITOR) 80 MG tablet TAKE  ONE TABLET BY MOUTH ONE TIME DAILY 90 tablet 3   dapagliflozin propanediol (FARXIGA) 10 MG TABS tablet Take 1 tablet (10 mg total) by mouth daily before breakfast. 30 tablet 3   diclofenac Sodium (VOLTAREN ARTHRITIS PAIN) 1 % GEL Apply 2 g topically 4 (four) times daily. 100 g 1   diphenhydrAMINE (BENADRYL) 25 MG tablet Take 25 mg by mouth every 6 (six) hours as needed for allergies.     famotidine (PEPCID) 20 MG tablet Take 20 mg by mouth daily as needed for indigestion.      ferrous sulfate 325 (65 FE) MG tablet Take 1 tablet (325 mg total) by mouth daily with breakfast. 30 tablet 3   furosemide (LASIX) 40 MG tablet TAKE ONE TABLET BY MOUTH ONE TIME DAILY; TAKE AN EXTRA TABLET AT NIGHT AS NEEDED 45 tablet 3   Homeopathic Products (ARNICARE ARNICA EX) Apply 1 application. topically as needed (bruises).     lidocaine (LIDODERM) 5 % Place 1 patch onto the skin daily. Remove & Discard patch within 12 hours or as directed by MD 30 patch 0   methocarbamol (ROBAXIN) 500 MG tablet Take 2 tablets (1,000 mg total) by mouth every 8 (eight) hours as needed for muscle spasms. 90 tablet 0   metoprolol succinate (TOPROL-XL) 100 MG 24 hr tablet Take 1 tablet (100 mg total) by mouth daily. Take with or immediately following a meal. 90 tablet 3   sacubitril-valsartan (ENTRESTO) 49-51 MG Take 1 tablet by mouth 2 (two) times daily. 60 tablet 11   sodium chloride (OCEAN) 0.65 % SOLN nasal spray Place 1 spray into both nostrils daily as needed (for congestion).     spironolactone (ALDACTONE) 25 MG tablet Take 1 tablet (25 mg total) by mouth daily. 90 tablet 3   vitamin B-12 (CYANOCOBALAMIN) 1000 MCG tablet Take 1 tablet (1,000 mcg total) by mouth daily.     Current Facility-Administered Medications  Medication Dose Route Frequency Provider Last Rate Last Admin   incobotulinumtoxinA (XEOMIN) 50 units injection 50 Units  50 Units Intramuscular Q90 days Marcial Pacas, MD   50 Units at 11/30/16 1546   incobotulinumtoxinA  (XEOMIN) 50 units injection 50 Units  50 Units Intramuscular Q90 days Marcial Pacas, MD   50 Units at 07/25/17 0843   incobotulinumtoxinA (XEOMIN) 50 units injection 50 Units  50 Units Intramuscular Q90 days Marcial Pacas, MD   50 Units at 07/26/17 1217    Allergies  Allergen Reactions   Azithromycin Nausea Only   Latex Rash   Spinach Hives    Cooked    Tape Dermatitis and Other (See Comments)    PLEASE USE  PAPER TAPE!! AFFECTS SKIN BADLY!!!!      Social History   Socioeconomic History   Marital status: Widowed    Spouse name: Not on file   Number of children: 2   Years of education: Not on file   Highest education level: Bachelor's degree (e.g., BA, AB, BS)  Occupational History   Occupation: Payroll/Paperwork    Comment: 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   Occupation: Disability    Comment: since march 2023  Tobacco Use   Smoking status: Former    Packs/day: 1.00    Years: 30.00    Additional pack years: 0.00    Total pack years: 30.00    Types: Cigarettes    Quit date: 10/2021    Years since quitting: 1.0   Smokeless tobacco: Never  Vaping Use   Vaping Use: Never used  Substance and Sexual Activity   Alcohol use: Yes    Alcohol/week: 1.0 standard drink of alcohol    Types: 1 Cans of beer per week    Comment: can of beer 1 x week   Drug use: No   Sexual activity: Yes  Other Topics Concern   Not on file  Social History Narrative   Lives at home with husband and son.   Right-handed.   2 cups caffeine daily.   Social Determinants of Health   Financial Resource Strain: Not on file  Food Insecurity: No Food Insecurity (12/21/2021)   Hunger Vital Sign    Worried About Running Out of Food in the Last Year: Never true    Ran Out of Food in the Last Year: Never true  Transportation Needs: No Transportation Needs (12/21/2021)   PRAPARE - Hydrologist (Medical): No    Lack of Transportation (Non-Medical): No  Physical Activity: Not on file   Stress: Not on file  Social Connections: Not on file  Intimate Partner Violence: Not on file      Family History  Problem Relation Age of Onset   Hypertension Mother    Stroke Mother    Heart disease Father    COPD Father    Bone cancer Maternal Grandmother    Anesthesia problems Neg Hx    Hypotension Neg Hx    Malignant hyperthermia Neg Hx    Pseudochol deficiency Neg Hx    Colon cancer Neg Hx     There were no vitals filed for this visit.     PHYSICAL EXAM: General:  Ambulated in clinic . No resp difficulty HEENT: normal Neck: supple. no JVD. Carotids 2+ bilat; no bruits. No lymphadenopathy or thryomegaly appreciated. Cor: PMI nondisplaced. Regular rate & rhythm. No rubs, gallops or murmurs. Lungs: clear decreased throughout  Abdomen: soft, nontender, nondistended. No hepatosplenomegaly. No bruits or masses. Good bowel sounds. Extremities: no cyanosis, clubbing, rash, edema Neuro: alert & orientedx3, cranial nerves grossly intact. moves all 4 extremities w/o difficulty. Affect pleasant   Pacer interrogated in clinic with help of device rep. Device says AT/AF burden = 0% but v-histogram shows episodes of ventricular rates 110-130. Not well captured on a-histograms. PVC 1.2%   ASSESSMENT & PLAN: Chronic systolic CHF: -Echo [id number]: EF 55-60% -Echo 10/2018: EF 30%. In setting of AFL with RVR -Echo 02/2019: EF 60-65% -Echo 01/23: EF 50-55% -Recurrent atrial arrhythmias end of January 2023 -Echo 04/23 in setting of atrial tach with RVR: EF 30-35%, RV moderately reduced, RVSP 58 mmHg, s/p MV repair,at least moderate MR, severe TR, dilated IVC with estimated RAP  15 mmHg -TEE 01/19/22: EF 30-35%, RV severely enlarged with moderately reduced function, severe LAE, mild mitral stenosis with MG 4 mmHg across mitral valve ring (likely related to small ring), moderate MR, RVSP 58 mmHg, moderate TR - Echo 05/18/22 EF 40% mild to moderate MR, mild MS, mild TR Personally  reviewed -Etiology not certain. ?Tachy-mediated in setting of atrial arrhythmias. EF recovering slowly with restoration of SR.Will continue to follow. We discussed possible cath to look for progressive CAD/vein graft disease but have decided to defer with improving EF and no s/s angina. No cMRI with pacemaker. -Improved NYHA II Volume looks good on exam. Takes lasix 40 daily with extra as needed -Continue Metoprolol XL 100 mg daily -Continue Entresto 24/26 mg BID -Continue Farxiga 10 mg daily -Continue spiro 25 mg daily -Labs today  2. CAD: -S/p CABG (LIMA to LAD, SVG to PDA, SVG to OM, SVG to diagonal) in 2013 -No aspirin with anticoagulation -Continue statin -No s/s angina. See discussion above  3. S/p mitral valve repair with MR/MS: -Echo 04/23: EF 30-35%, severe MR and possibly severe mitral stenosis. Posterior leaflet restricted.  -TEE 05/31: EF 30-35%, mild mitral valve stenosis with mean gradient 4 mmHg (d/t small ring) and moderate MR - Echo today 05/18/22 EF 40% mild to moderate MR, mild MS, mild TR Personally reviewed  4. Tricuspid regurgitation: -Severe on echo 04/23 -Moderate on TEE 05/23 - Mild on echo 9/23  5. Atrial arrhythmias - AT/AFL/AF: - Followed by Dr.Klein. Off amio in 7/23 in setting of hyperthyroidism  - Has f/u with Dr. Caryl Comes next month to review and discuss possible abaltion -Anticoagulated with Eliquis 5 mg BID.   6. Tobacco use: -Quit smoking cold [place] 04/23  7. HLD:  -On high-intensity statin  8. Amio-induced hyperthyroidism - amio stopped 8/23. Treated with prednisone - repeat TFTs today  9. Pulmonary nodules: -Noted on CT scan 04/23. Nodularity up to 2 cm right lung apex and areas of ground-glass nodularity RUL. Findings possibly infectious or inflammatory. Neoplasm not exculded. -Recently saw Pulmonary. Nodules resolved   Glori Bickers, MD  11:49 PM

## 2022-11-15 ENCOUNTER — Ambulatory Visit (HOSPITAL_COMMUNITY)
Admission: RE | Admit: 2022-11-15 | Discharge: 2022-11-15 | Disposition: A | Discharge status: Home / Self Care | Payer: Medicaid Other | Source: Ambulatory Visit | Attending: Internal Medicine | Admitting: Internal Medicine

## 2022-11-15 ENCOUNTER — Encounter (HOSPITAL_COMMUNITY): Payer: Self-pay | Admitting: Internal Medicine

## 2022-11-15 VITALS — BP 120/70 | HR 75 | Wt 179.6 lb

## 2022-11-15 DIAGNOSIS — I5022 Chronic systolic (congestive) heart failure: Secondary | ICD-10-CM | POA: Diagnosis present

## 2022-11-15 DIAGNOSIS — I110 Hypertensive heart disease with heart failure: Secondary | ICD-10-CM | POA: Diagnosis not present

## 2022-11-15 DIAGNOSIS — E0590 Thyrotoxicosis, unspecified without thyrotoxic crisis or storm: Secondary | ICD-10-CM | POA: Diagnosis not present

## 2022-11-15 DIAGNOSIS — I081 Rheumatic disorders of both mitral and tricuspid valves: Secondary | ICD-10-CM | POA: Insufficient documentation

## 2022-11-15 DIAGNOSIS — I5042 Chronic combined systolic (congestive) and diastolic (congestive) heart failure: Secondary | ICD-10-CM | POA: Diagnosis not present

## 2022-11-15 DIAGNOSIS — R918 Other nonspecific abnormal finding of lung field: Secondary | ICD-10-CM | POA: Insufficient documentation

## 2022-11-15 DIAGNOSIS — Z951 Presence of aortocoronary bypass graft: Secondary | ICD-10-CM | POA: Insufficient documentation

## 2022-11-15 DIAGNOSIS — I498 Other specified cardiac arrhythmias: Secondary | ICD-10-CM | POA: Diagnosis not present

## 2022-11-15 DIAGNOSIS — Z79899 Other long term (current) drug therapy: Secondary | ICD-10-CM | POA: Diagnosis not present

## 2022-11-15 DIAGNOSIS — Z8673 Personal history of transient ischemic attack (TIA), and cerebral infarction without residual deficits: Secondary | ICD-10-CM | POA: Diagnosis not present

## 2022-11-15 DIAGNOSIS — Z7901 Long term (current) use of anticoagulants: Secondary | ICD-10-CM | POA: Insufficient documentation

## 2022-11-15 DIAGNOSIS — Z87891 Personal history of nicotine dependence: Secondary | ICD-10-CM | POA: Diagnosis not present

## 2022-11-15 DIAGNOSIS — E785 Hyperlipidemia, unspecified: Secondary | ICD-10-CM | POA: Insufficient documentation

## 2022-11-15 DIAGNOSIS — I480 Paroxysmal atrial fibrillation: Secondary | ICD-10-CM | POA: Diagnosis not present

## 2022-11-15 DIAGNOSIS — I2510 Atherosclerotic heart disease of native coronary artery without angina pectoris: Secondary | ICD-10-CM | POA: Insufficient documentation

## 2022-11-15 LAB — BASIC METABOLIC PANEL
Anion gap: 11 (ref 5–15)
BUN: 35 mg/dL — ABNORMAL HIGH (ref 6–20)
CO2: 28 mmol/L (ref 22–32)
Calcium: 9.4 mg/dL (ref 8.9–10.3)
Chloride: 101 mmol/L (ref 98–111)
Creatinine, Ser: 1.76 mg/dL — ABNORMAL HIGH (ref 0.44–1.00)
GFR, Estimated: 33 mL/min — ABNORMAL LOW (ref >60)
Glucose, Bld: 68 mg/dL — ABNORMAL LOW (ref 70–99)
Potassium: 4.8 mmol/L (ref 3.5–5.1)
Sodium: 140 mmol/L (ref 135–145)

## 2022-11-15 NOTE — Patient Instructions (Signed)
There has been no changes to your medications.  Labs done today, your results will be available in MyChart, we will contact you for abnormal readings.  Your physician recommends that you schedule a follow-up appointment in: 1 year ( March 2025) ** please call the office in January 2025 to arrange your follow up appointment. **  If you have any questions or concerns before your next appointment please send us a message through mychart or call our office at [phone].    TO LEAVE A MESSAGE FOR THE NURSE SELECT OPTION 2, PLEASE LEAVE A MESSAGE INCLUDING: YOUR NAME DATE OF BIRTH CALL BACK NUMBER REASON FOR CALL**this is important as we prioritize the call backs  YOU WILL RECEIVE A CALL BACK THE SAME DAY AS LONG AS YOU CALL BEFORE 4:00 PM  At the [facility], you and your health needs are our priority. As part of our continuing mission to provide you with exceptional heart care, we have created designated Provider Care Teams. These Care Teams include your primary Cardiologist (physician) and Advanced Practice Providers (APPs- Physician Assistants and Nurse Practitioners) who all work together to provide you with the care you need, when you need it.   You may see any of the following providers on your designated Care Team at your next follow up: Dr Daniel Bensimhon Dr Dalton McLean Dr. Aditya Sabharwal Amy Clegg, NP Brittainy Simmons, PA Jessica Milford,NP Lindsay Finch, PA Alma Diaz, NP Lauren Kemp, PharmD   Please be sure to bring in all your medications bottles to every appointment.    Thank you for choosing [facility]    

## 2022-12-20 ENCOUNTER — Ambulatory Visit (INDEPENDENT_AMBULATORY_CARE_PROVIDER_SITE_OTHER): Payer: Medicaid Other

## 2022-12-20 DIAGNOSIS — I428 Other cardiomyopathies: Secondary | ICD-10-CM

## 2022-12-21 LAB — CUP PACEART REMOTE DEVICE CHECK
Battery Remaining Longevity: 80 mo
Battery Remaining Percentage: 69 %
Battery Voltage: 3.01 V
Brady Statistic AP VP Percent: 1 %
Brady Statistic AP VS Percent: 58 %
Brady Statistic AS VP Percent: 1 %
Brady Statistic AS VS Percent: 40 %
Brady Statistic RA Percent Paced: 56 %
Brady Statistic RV Percent Paced: 1 %
Date Time Interrogation Session: 20240430020013
Implantable Lead Connection Status: 753985
Implantable Lead Connection Status: 753985
Implantable Lead Implant Date: 20201103
Implantable Lead Implant Date: 20201103
Implantable Lead Location: 753859
Implantable Lead Location: 753860
Implantable Pulse Generator Implant Date: 20201103
Lead Channel Impedance Value: 450 Ohm
Lead Channel Impedance Value: 600 Ohm
Lead Channel Pacing Threshold Amplitude: 0.75 V
Lead Channel Pacing Threshold Amplitude: 1 V
Lead Channel Pacing Threshold Pulse Width: 0.5 ms
Lead Channel Pacing Threshold Pulse Width: 0.5 ms
Lead Channel Sensing Intrinsic Amplitude: 12 mV
Lead Channel Sensing Intrinsic Amplitude: 5 mV
Lead Channel Setting Pacing Amplitude: 1.5 V
Lead Channel Setting Pacing Amplitude: 2.5 V
Lead Channel Setting Pacing Pulse Width: 0.5 ms
Lead Channel Setting Sensing Sensitivity: 2 mV
Pulse Gen Model: 2272
Pulse Gen Serial Number: 9174760

## 2023-01-09 ENCOUNTER — Other Ambulatory Visit (HOSPITAL_COMMUNITY): Payer: Self-pay | Admitting: Physician Assistant

## 2023-01-11 NOTE — Progress Notes (Signed)
Remote pacemaker transmission.   

## 2023-02-16 ENCOUNTER — Other Ambulatory Visit: Payer: Self-pay | Admitting: Physician Assistant

## 2023-02-16 NOTE — Telephone Encounter (Signed)
This is a CHF pt 

## 2023-02-21 ENCOUNTER — Encounter: Payer: Self-pay | Admitting: Family Medicine

## 2023-02-21 ENCOUNTER — Other Ambulatory Visit: Payer: Self-pay

## 2023-02-21 ENCOUNTER — Ambulatory Visit: Payer: Medicaid Other | Attending: Family Medicine | Admitting: Family Medicine

## 2023-02-21 ENCOUNTER — Telehealth: Payer: Self-pay | Admitting: *Deleted

## 2023-02-21 VITALS — BP 94/62 | HR 70 | Temp 98.4°F | Ht 68.0 in | Wt 183.6 lb

## 2023-02-21 DIAGNOSIS — Z87891 Personal history of nicotine dependence: Secondary | ICD-10-CM | POA: Insufficient documentation

## 2023-02-21 DIAGNOSIS — Z1231 Encounter for screening mammogram for malignant neoplasm of breast: Secondary | ICD-10-CM | POA: Diagnosis not present

## 2023-02-21 DIAGNOSIS — I10 Essential (primary) hypertension: Secondary | ICD-10-CM

## 2023-02-21 DIAGNOSIS — M79601 Pain in right arm: Secondary | ICD-10-CM

## 2023-02-21 DIAGNOSIS — Z8673 Personal history of transient ischemic attack (TIA), and cerebral infarction without residual deficits: Secondary | ICD-10-CM | POA: Insufficient documentation

## 2023-02-21 DIAGNOSIS — Z951 Presence of aortocoronary bypass graft: Secondary | ICD-10-CM | POA: Diagnosis not present

## 2023-02-21 DIAGNOSIS — I4891 Unspecified atrial fibrillation: Secondary | ICD-10-CM | POA: Diagnosis not present

## 2023-02-21 DIAGNOSIS — I5040 Unspecified combined systolic (congestive) and diastolic (congestive) heart failure: Secondary | ICD-10-CM | POA: Diagnosis not present

## 2023-02-21 DIAGNOSIS — I2510 Atherosclerotic heart disease of native coronary artery without angina pectoris: Secondary | ICD-10-CM | POA: Diagnosis not present

## 2023-02-21 DIAGNOSIS — I110 Hypertensive heart disease with heart failure: Secondary | ICD-10-CM | POA: Insufficient documentation

## 2023-02-21 DIAGNOSIS — M25519 Pain in unspecified shoulder: Secondary | ICD-10-CM | POA: Diagnosis not present

## 2023-02-21 DIAGNOSIS — I4819 Other persistent atrial fibrillation: Secondary | ICD-10-CM

## 2023-02-21 DIAGNOSIS — M62838 Other muscle spasm: Secondary | ICD-10-CM | POA: Diagnosis not present

## 2023-02-21 DIAGNOSIS — Z950 Presence of cardiac pacemaker: Secondary | ICD-10-CM | POA: Insufficient documentation

## 2023-02-21 DIAGNOSIS — I5042 Chronic combined systolic (congestive) and diastolic (congestive) heart failure: Secondary | ICD-10-CM | POA: Diagnosis not present

## 2023-02-21 MED ORDER — METHOCARBAMOL 500 MG PO TABS: 1000.0000 mg | ORAL_TABLET | Freq: Three times a day (TID) | ORAL | 0 refills | Status: AC | PRN

## 2023-02-21 MED ORDER — PREDNISONE 20 MG PO TABS
20.0000 mg | ORAL_TABLET | Freq: Every day | ORAL | 0 refills | Status: DC
Start: 2023-02-21 — End: 2023-02-28

## 2023-02-21 NOTE — Patient Instructions (Signed)
Muscle Pain, Adult Muscle pain, also called myalgia, is a condition in which a person has pain in one or more muscles in the body. The pain may be mild, moderate, or severe. It may feel sharp, achy, or burning. In most cases, the pain lasts only a short time and goes away on its own. It is normal to feel some muscle pain after you start a new exercise program. Muscles that have not been used a lot will be sore at first. What are the causes? You may have muscle pain when you use your muscles in a new or different way after not having used them for some time. Muscle pain can also be caused by overuse or by stretching a muscle beyond its normal length (muscle strain). You may be more likely to have muscle pain if you are not in shape. Other causes may include: Injury or bruising. Infectious diseases. These include diseases caused by viruses, such as the flu (influenza). Fibromyalgia. This is a long-term (chronic) condition that causes muscle tenderness, tiredness (fatigue), and headache. Autoimmune or rheumatologic diseases. These are conditions, such as lupus, that cause the body's defense system (immune system) to attack areas in the body. Certain medicines. These include ACE inhibitors and statins. What are the signs or symptoms? The main symptom is sore or painful muscles. Your muscles may be sore when you do activities and when you stretch. You may also have slight swelling. How is this diagnosed? Muscle pain is diagnosed with a physical exam. Your health care provider will ask questions about your pain and when it began. If you have not had muscle pain for very long, your provider may want to wait before doing much testing. If your muscle pain has lasted a long time, tests may be done right away. In some cases, you may need tests to rule out other conditions and diseases. How is this treated? Treatment for muscle pain depends on the cause. Home care is usually enough to relieve the pain. Your  provider may also prescribe NSAIDs, such as ibuprofen. Follow these instructions at home: Medicines Take over-the-counter and prescription medicines only as told by your provider. Ask your health care provider if the medicine prescribed to you requires you to avoid driving or using machinery. Managing pain, swelling, and discomfort     If told, put ice on the painful area for the first 2 days of soreness. Put ice in a plastic bag. Place a towel between your skin and the bag. Leave the ice on for 20 minutes, 2-3 times a day. If your skin turns bright red, remove the ice right away to prevent skin damage. The risk of damage is higher if you cannot feel pain, heat, or cold. For the first 2 days of muscle soreness, or if there is swelling: Do not soak in hot baths. Do not use a hot tub, steam room, sauna, heating pad, or other heat source. After 2-3 days, you may switch between putting ice and heat on the area. If told, apply heat to the affected area as often as told by your provider. Use the heat source that your recommends, such as a moist heat pack or a heating pad. Place a towel between your skin and the heat source. Leave the heat on for 20-30 minutes. If your skin turns bright red, remove the ice or heat right away to prevent skin damage. The risk of damage is higher if you cannot feel pain, heat, or cold. If you have an injury,   raise (elevate) the injured area above the level of your heart while you are sitting or lying down. Activity  If your muscle pain is caused by overuse: Slow down your activities until the pain goes away. Do regular, gentle exercises if you are not normally active. Warm up before you exercise. Stretch before and after you exercise. This can help lower the risk of muscle pain. Do not keep working out if the pain is severe. Severe pain could mean that you have injured a muscle. You may have to avoid lifting. Ask your provider how much you can safely lift. Return  to your normal activities as told by your provider. Ask your provider what activities are safe for you. General instructions Do not use any products that contain nicotine or tobacco. These products include cigarettes, chewing tobacco, and vaping devices, such as e-cigarettes. If you need help quitting, ask your provider. Contact a health care provider if: Your muscle pain gets worse, and medicines do not help. The muscle pain lasts longer than 3 days. You have a rash or fever. You have muscle pain after a tick bite. You have muscle pain when you work out, even though you are in good shape. You have redness, soreness, or swelling. You have muscle pain after you start a new medicine or change the dose of a medicine. Get help right away if: You have trouble breathing. You have trouble swallowing. You have muscle pain along with a stiff neck, fever, and vomiting. You have severe muscle weakness, or you cannot move part of your body. You are urinating less, or you have dark, bloody, or discolored urine. You have redness or swelling at the site of the muscle pain. These symptoms may be an emergency. Get help right away. Call 911. Do not wait to see if the symptoms will go away. Do not drive yourself to the hospital. This information is not intended to replace advice given to you by your health care provider. Make sure you discuss any questions you have with your health care provider. Document Revised: 03/18/2022 Document Reviewed: 03/18/2022 Elsevier Patient Education  2024 [employer] Inc[employer]  

## 2023-02-21 NOTE — Telephone Encounter (Signed)
I spoke with patient and scheduled her to see Dr Lynnette Caffey (D[facility]) on July 5 at 1:20

## 2023-02-21 NOTE — Telephone Encounter (Signed)
   Called by Dr. Alvis Lemmings regarding patient's tachycardia noted at her office visit.  I reviewed her ECG.  She appeared to be in a 2:1 atrial flutter.  She is anticoagulated.  She is reportedly asymptomatic and the tachycardia was an incidental finding.  I would have her take an extra 50 mg of metoprolol ER once she gets home.  Stay well-hydrated.  Avoid strenuous activity.  We will plan for cardiology follow-up later this week.  Hopefully, she will convert to sinus on her own.  If she does not convert quickly, could consider cardioversion as long as it has been verified that she has not missed any anticoagulation.  Corky Crafts, MD

## 2023-02-21 NOTE — Progress Notes (Signed)
Subjective:  Patient ID: Ashley [name], female    DOB: [birth year]  Age: 57 y.o. MRN: [id number]  CC: Arm Pain   HPI Jalayah Kaler Hobdy is a 57 y.o. year old female with a history of  hypertension,previous CVA, CAD status post CABG, MVP s/p mitral valve repair, atrial fibrillation failed Tikosyn treatment due to prolonged QTC, DCCV in10/2020 with period of asystole, junctional rhythm in the 20s which was treated with atropine and ephedrine and dopamine for hypotension, sinus node dysfunction status post pacemaker placement in 06/2019 here for follow-up visit.   Interval History: Discussed the use of AI scribe software for clinical note transcription with the patient, who gave verbal consent to proceed.   The patient, a widow with a history of cardiovascular disease and hyperlipidemia, presents with right arm pain of one week's duration. The pain is located in the bicep and shoulder, exacerbated by lifting the arm and holding the steering wheel while driving. She denies any numbness, tingling, or neck pain.  The patient is currently under significant stress due to family dynamics and an upcoming wedding at her home. Despite this, she reports adherence to her medication regimen, including cholesterol medication, and has not noticed any side effects. She has cut down on smoking to approximately a pack every four days. Denies presence of chest pain, palpitations, dyspnea.  Last visit with cardiology was in 10/2022.  She endorses adherence with her cardiac medications.  The patient also mentions a persistent skin discoloration from a hospital stay two years ago, which has not responded to various treatments.        Past Medical History:  Diagnosis Date   Abdominal pain    ? chronic cholecystitis; admxn to [facility] 9/12 (CT, USN, HIDA done)   Atrial fibrillation (HCC)    Cardiomyopathy    echo 9/12: mild LVH, EF 35-40%, mod MR (difficult to judge /post leaflet restricted), mod BAE, mod  RVE, mod TR    Cardiomyopathy (HCC)    a. echo 9/12: mild LVH, EF 35-40%, mod MR (difficult to judge /post leaflet restricted), mod BAE, mod RVE, mod TR. b. Echo 11/2011: mild LVH, EF 55-60%, s/p MV repair w/o sig MR/MS, mildly dilated RV/RA, PASP . // AFL dx 10/2018 - Echo 10/2018: EF 30, diff HK, s/p MV repair with trivial MR, mean MV 7 but not c/w significant mitral stenosis, mild RAE, severe LAE, mod reduced RVSF    Carotid artery disease (HCC)    a. Duplex 06/2015: 50% RECA, 1-39% BICA.   CHF (congestive heart failure) (HCC)    Clotting disorder (HCC)    Coronary artery disease    a. s/p CABG 10/2011 - LIMA-LAD, SVG-PDA, SVG-OM, SVG-diagonal.   Dyslipidemia    Essential hypertension    Heart murmur    Hemifacial spasm    History of Doppler ultrasound    a. Carotid US [facility] in [place], [place]) 7/17: no hemodynamically significant ICA stenosis   Hyperlipidemia    Mitral regurgitation    a. s/p MV repair 10/2011 - on Coumadin for 3 months afterwards then d/c'd by surgery.   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 age 80 - in setting of cigs and OCPs. //  b. s/p acute L parietal CVA >> tPA - [name] in [place], [place]   Stroke [facility]) 01/2017   Tobacco abuse  Past Surgical History:  Procedure Laterality Date   CARDIAC CATHETERIZATION     CARDIOVERSION N/A 01/25/2019   Procedure: CARDIOVERSION;  Surgeon: Chrystie Nose, MD;  Location[facility] ENDOSCOPY;  Service: Cardiovascular;  Laterality: N/A;   CARDIOVERSION N/A 06/14/2019   Procedure: CARDIOVERSION;  Surgeon: Chrystie Nose, MD;  Location: [facility] ENDOSCOPY;  Service: Cardiovascular;  Laterality: N/A;   CESAREAN SECTION  1996   twins   CORONARY ARTERY BYPASS GRAFT  10/21/2011   Procedure: CORONARY ARTERY BYPASS GRAFTING (CABG);  Surgeon: Purcell Nails, MD;  Location: [facility] OR;  Service: Open Heart Surgery;   Laterality: N/A;  cabg x four, using right leg greater saphenous vein harvested endoscopically   DENTAL SURGERY     MITRAL VALVE REPAIR  10/21/2011   Procedure: MITRAL VALVE REPAIR (MVR);  Surgeon: Purcell Nails, MD;  Location: [facility] OR;  Service: Open Heart Surgery;  Laterality: N/A;   PACEMAKER IMPLANT N/A 06/25/2019   Procedure: PACEMAKER IMPLANT;  Surgeon: Hillis Range, MD;  Location: [facility] INVASIVE CV LAB;  Service: Cardiovascular;  Laterality: N/A;   TEE WITHOUT CARDIOVERSION  06/30/2011   Procedure: TRANSESOPHAGEAL ECHOCARDIOGRAM (TEE);  Surgeon: Pricilla Riffle, MD;  Location: [facility] ENDOSCOPY;  Service: Cardiovascular;  Laterality: N/A;   TEE WITHOUT CARDIOVERSION N/A 01/19/2022   Procedure: TRANSESOPHAGEAL ECHOCARDIOGRAM (TEE);  Surgeon: Sande Rives, MD;  Location: [name] ENDOSCOPY;  Service: Cardiovascular;  Laterality: N/A;   TONSILLECTOMY     TUBAL LIGATION      Family History  Problem Relation Age of Onset   Hypertension Mother    Stroke Mother    Heart disease Father    COPD Father    Bone cancer Maternal Grandmother    Anesthesia problems Neg Hx    Hypotension Neg Hx    Malignant hyperthermia Neg Hx    Pseudochol deficiency Neg Hx    Colon cancer Neg Hx     Social History   Socioeconomic History   Marital status: Widowed    Spouse name: Not on file   Number of children: 2   Years of education: Not on file   Highest education level: Bachelor's degree (e.g., BA, AB, BS)  Occupational History   Occupation: Payroll/Paperwork    Comment: Owns a Contractor.   Occupation: Disability    Comment: since march 2023  Tobacco Use   Smoking status: Former    Packs/day: 1.00    Years: 30.00    Additional pack years: 0.00    Total pack years: 30.00    Types: Cigarettes    Quit date: 10/2021    Years since quitting: 1.3   Smokeless tobacco: Never  Vaping Use   Vaping Use: Never used  Substance and Sexual Activity   Alcohol use: Yes    Alcohol/week:  1.0 standard drink of alcohol    Types: 1 Cans of beer per week    Comment: can of beer 1 x week   Drug use: No   Sexual activity: Yes  Other Topics Concern   Not on file  Social History Narrative   Lives at home with husband and son.   Right-handed.   2 cups caffeine daily.   Social Determinants of Health   Financial Resource Strain: Not on file  Food Insecurity: No Food Insecurity (02/17/2023)   Hunger Vital Sign    Worried About Running Out of Food in the Last Year: Never true    Ran Out of Food in the Last Year: Never true  Transportation Needs: No Transportation Needs (02/17/2023)   PRAPARE - Administrator, Civil Service (Medical): No    Lack of Transportation (Non-Medical): No  Physical Activity: Insufficiently Active (02/17/2023)   Exercise Vital Sign    Days of Exercise per Week: 4 days    Minutes of Exercise per Session: 30 min  Stress: No Stress Concern Present (02/17/2023)   [employer] of Occupational Health - Occupational Stress Questionnaire    Feeling of Stress : Only a little  Social Connections: Moderately Isolated (02/17/2023)   Social Connection and Isolation Panel [NHANES]    Frequency of Communication with Friends and Family: More than three times a week    Frequency of Social Gatherings with Friends and Family: More than three times a week    Attends Religious Services: 1 to 4 times per year    Active Member of [employer] or Organizations: No    Attends Banker Meetings: Not on file    Marital Status: Widowed    Allergies  Allergen Reactions   Azithromycin Nausea Only   Latex Rash   Spinach Hives    Cooked    Tape Dermatitis and Other (See Comments)    PLEASE USE PAPER TAPE!! AFFECTS SKIN BADLY!!!!    Outpatient Medications Prior to Visit  Medication Sig Dispense Refill   acetaminophen (TYLENOL) 500 MG tablet Take 500-1,000 mg by mouth every 6 (six) hours as needed for headache.     apixaban (ELIQUIS) 5 MG TABS tablet  TAKE ONE TABLET BY MOUTH TWICE A DAY 180 tablet 1   Ascorbic Acid (VITAMIN C) 1000 MG tablet Take 1,000 mg by mouth 2 (two) times daily.      atorvastatin (LIPITOR) 80 MG tablet TAKE ONE TABLET BY MOUTH ONE TIME DAILY 90 tablet 3   dapagliflozin propanediol (FARXIGA) 10 MG TABS tablet Take 1 tablet (10 mg total) by mouth daily before breakfast. 30 tablet 3   diclofenac Sodium (VOLTAREN ARTHRITIS PAIN) 1 % GEL Apply 2 g topically as needed.     diphenhydrAMINE (BENADRYL) 25 MG tablet Take 25 mg by mouth every 6 (six) hours as needed for allergies.     famotidine (PEPCID) 20 MG tablet Take 20 mg by mouth daily as needed for indigestion.      ferrous sulfate 325 (65 FE) MG tablet Take 1 tablet (325 mg total) by mouth daily with breakfast. 30 tablet 3   furosemide (LASIX) 40 MG tablet TAKE ONE TABLET BY MOUTH ONE TIME DAILY, TAKE AN EXTRA TABLET AT NIGHT AS NEEDED 45 tablet 3   Homeopathic Products (ARNICARE ARNICA EX) Apply 1 application. topically as needed (bruises).     lidocaine (LIDODERM) 5 % Place 1 patch onto the skin as needed. Remove & Discard patch within 12 hours or as directed by MD     metoprolol succinate (TOPROL-XL) 100 MG 24 hr tablet Take 1 tablet (100 mg total) by mouth daily. Take with or immediately following a meal. 90 tablet 3   sacubitril-valsartan (ENTRESTO) 49-51 MG Take 1 tablet by mouth 2 (two) times daily. 60 tablet 11   sodium chloride (OCEAN) 0.65 % SOLN nasal spray Place 1 spray into both nostrils daily as needed (for congestion).     spironolactone (ALDACTONE) 25 MG tablet TAKE ONE TABLET BY MOUTH ONE TIME DAILY 90 tablet 1   vitamin B-12 (CYANOCOBALAMIN) 1000 MCG tablet Take 1 tablet (1,000 mcg total) by mouth daily.     methocarbamol (ROBAXIN) 500 MG tablet Take  2 tablets (1,000 mg total) by mouth every 8 (eight) hours as needed for muscle spasms. 90 tablet 0   Facility-Administered Medications Prior to Visit  Medication Dose Route Frequency Provider Last Rate Last  Admin   incobotulinumtoxinA (XEOMIN) 50 units injection 50 Units  50 Units Intramuscular Q90 days Levert Feinstein, MD   50 Units at 11/30/16 1546   incobotulinumtoxinA (XEOMIN) 50 units injection 50 Units  50 Units Intramuscular Q90 days Levert Feinstein, MD   50 Units at 07/25/17 0843   incobotulinumtoxinA (XEOMIN) 50 units injection 50 Units  50 Units Intramuscular Q90 days Levert Feinstein, MD   50 Units at 07/26/17 1217     ROS Review of Systems  Constitutional:  Negative for activity change and appetite change.  HENT:  Negative for sinus pressure and sore throat.   Respiratory:  Negative for chest tightness, shortness of breath and wheezing.   Cardiovascular:  Negative for chest pain and palpitations.  Gastrointestinal:  Negative for abdominal distention, abdominal pain and constipation.  Genitourinary: Negative.   Musculoskeletal:        See HPI  Skin:  Positive for color change.  Psychiatric/Behavioral:  Negative for behavioral problems and dysphoric mood.     Objective:  BP 94/62   Pulse 70   Temp 98.4 F (36.9 C) (Oral)   Ht 5\' 8"  (1.727 m)   Wt 183 lb 9.6 oz (83.3 kg)   LMP 05/18/2015 Comment: went for 3 week after skipping two months   SpO2 100%   BMI 27.92 kg/m      02/21/2023   10:29 AM 11/15/2022   10:47 AM 11/01/2022   10:19 AM  BP/Weight  Systolic BP 94 120   Diastolic BP 62 70   Wt. (Lbs) 183.6 179.6 175  BMI 27.92 kg/m2 26.91 kg/m2 26.22 kg/m2      Physical Exam Constitutional:      Appearance: She is well-developed.  Cardiovascular:     Rate and Rhythm: Tachycardia present.     Heart sounds: Normal heart sounds. No murmur heard. Pulmonary:     Effort: Pulmonary effort is normal.     Breath sounds: Normal breath sounds. No wheezing or rales.  Chest:     Chest wall: No tenderness.  Abdominal:     General: Bowel sounds are normal. There is no distension.     Palpations: Abdomen is soft. There is no mass.     Tenderness: There is no abdominal tenderness.   Musculoskeletal:     Right lower leg: No edema.     Left lower leg: No edema.     Comments: Normal appearance of both biceps muscle Full range of motion of both upper extremities Slight tenderness on deep palpation of right biceps muscles  Neurological:     Mental Status: She is alert and oriented to person, place, and time.  Psychiatric:        Mood and Affect: Mood normal.        Latest Ref Rng & Units 11/15/2022   11:29 AM 05/18/2022   10:49 AM 04/04/2022   10:23 AM  CMP  Glucose 70 - 99 mg/dL 68  161  096   BUN 6 - 20 mg/dL 35  21  30   Creatinine 0.44 - 1.00 mg/dL 0.45  4.09  8.11   Sodium 135 - 145 mmol/L 140  142  138   Potassium 3.5 - 5.1 mmol/L 4.8  4.8  5.0   Chloride 98 - 111 mmol/L 101  101  101   CO2 22 - 32 mmol/L 28  30  25    Calcium 8.9 - 10.3 mg/dL 9.4  9.7  9.8   Total Protein 6.5 - 8.1 g/dL  7.2    Total Bilirubin 0.3 - 1.2 mg/dL  1.4    Alkaline Phos 38 - 126 U/L  120    AST 15 - 41 U/L  24    ALT 0 - 44 U/L  28      Lipid Panel     Component Value Date/Time   CHOL 131 08/29/2022 0845   TRIG 83 08/29/2022 0845   HDL 47 08/29/2022 0845   CHOLHDL 2.8 08/29/2022 0845   CHOLHDL 3.8 07/01/2016 0939   VLDL 25 07/01/2016 0939   LDLCALC 68 08/29/2022 0845    CBC    Component Value Date/Time   WBC 5.2 05/18/2022 1049   RBC 4.38 05/18/2022 1049   HGB 14.2 05/18/2022 1049   HGB 11.3 11/01/2021 1654   HCT 42.8 05/18/2022 1049   HCT 36.9 11/01/2021 1654   PLT 202 05/18/2022 1049   PLT 270 11/01/2021 1654   MCV 97.7 05/18/2022 1049   MCV 93 11/01/2021 1654   MCH 32.4 05/18/2022 1049   MCHC 33.2 05/18/2022 1049   RDW 14.0 05/18/2022 1049   RDW 13.5 11/01/2021 1654   LYMPHSABS 1.0 02/24/2022 2110   MONOABS 1.4 (H) 02/24/2022 2110   EOSABS 0.1 02/24/2022 2110   BASOSABS 0.0 02/24/2022 2110    Lab Results  Component Value Date   HGBA1C 5.1 03/16/2020    Lab Results  Component Value Date   TSH 2.920 08/29/2022    Assessment & Plan:       Right Arm Pain: Pain localized to the bicep area, exacerbated by movement and prolonged use. No associated numbness, tingling, or neck pain. Suspected muscle inflammation or strain. -Order aldolase and creatinine kinase to assess for muscle inflammation. -Prescribe a short course of Prednisone for 5 days to reduce inflammation. -Prescribe a muscle relaxant.  A.fib/ Tachycardia: Elevated heart rate noted during the visit, possibly due to stress or "white coat syndrome". No associated symptoms of chest pain, shortness of breath, or lightheadedness. -Initial heart rate was 70 but during my exam she was tachycardic -Recheck heart rate during the visit still tachycardic -EKG reveals sinus tachycardia 130 bpm, prolonged QTc 509 ms  -Remains on anticoagulation with Eliquis, rate controlled with metoprolol -The cardiologist Dr. Christena Flake on the phone and he is of the opinion she may be in a flutter.  He recommended she take an extra half of metoprolol tablet, monitor vitals at home, avoid any stresses. -She has a soft blood pressure of 94/62 hence I am concerned she could become more hypotensive if she took an extra antihypertensive.  She will monitor her blood pressure and call the cardiology clinic if tachycardia persists She is asymptomatic  Tobacco Use: Reports smoking approximately a pack every four days, though has reduced significantly. Acknowledges stress as a trigger. -Encourage continued efforts to quit smoking.  Hypertension: Soft blood pressure Asymptomatic Continue current antihypertensives.  Combined systolic and diastolic CHF: Euvolemic -EF of 44%, global hypokinesis from stress test of 10/2022 -Intermediate risk study -Continue Entresto -Continue follow-up with cardiology  General Health Maintenance: -Schedule Pap smear for 6 weeks from the visit. -Order mammogram. -Order colonoscopy. -Follow-up with cardiologist in August.  Follow-up in 6 weeks for Pap smear and to assess  response to treatment for right arm pain.  Meds ordered this encounter  Medications   methocarbamol (ROBAXIN) 500 MG tablet    Sig: Take 2 tablets (1,000 mg total) by mouth every 8 (eight) hours as needed for muscle spasms.    Dispense:  90 tablet    Refill:  0   predniSONE (DELTASONE) 20 MG tablet    Sig: Take 1 tablet (20 mg total) by mouth daily with breakfast.    Dispense:  5 tablet    Refill:  0    Follow-up: Return in about 6 weeks (around 04/04/2023) for Pap smear.     Visit required 51 minutes of patient care including median intraservice time, reviewing previous notes and test results, coordination of care, counseling the patient , consulting with cardiologist in addition to management of chronic medical conditions.Time also spent ordering medications, investigations and documenting in the chart.  All questions were answered to the patient's satisfaction   Hoy Register, MD, FAAFP. [facility] [place], [place] [phone]   02/21/2023, 11:48 AM

## 2023-02-22 ENCOUNTER — Telehealth: Payer: Self-pay

## 2023-02-22 NOTE — Telephone Encounter (Signed)
Requested remote transmission from Pt for upcoming appointment.  Pt presenting:  atrial tachycardia at 130 bpm.  Pt currently programmed to detect atrial tachycardia at rate of 160 bpm-therefore AT/AF burden is falsely low.

## 2023-02-22 NOTE — Progress Notes (Signed)
Cardiology Office Note:    Date:  02/24/2023   ID:  Ernest Mallick Lenhard, DOB 12-30-1965, MRN [id number]  PCP:  Hoy Register, MD   [facility] Providers Cardiologist:  Laurance Flatten EP Cardiologist:  Sherryl Manges,  HF Cardiologist:  Nicholes Mango Referring MD: Hoy Register, MD   Chief Complaint/Reason for Referral:  DOD visit   ASSESSMENT:    1. Atrial tachycardia   2. Chronic combined systolic and diastolic heart failure (HCC)   3. Hx of CABG   4. PAF (paroxysmal atrial fibrillation) (HCC)   5. Pacemaker   6. Hyperlipidemia LDL goal <70   7. Primary hypertension   8. Aortic atherosclerosis (HCC)     PLAN:    In order of problems listed above: 1.  Atrial tachycardia: I am reluctant to start a nondihydropyridine calcium channel blocker in this patient with abnormal ejection fraction, granted it is around 40% and not severely depressed.  Certainly not controlling the atrial tachycardia could depress LV function even more--as evidence by prior improvement in LV dysfunction with restoration of sinus rhythm.  I will have the patient see EP.  Amiodarone was not well-tolerated due to thyroid issues.  She is not in acute heart failure though her blood pressure is low.  She is asymptomatic.  I think it is fine for her to be seen by EP as an outpatient we will arrange for this next week.  I think given her resistant atrial tachycardia and associated cardiomyopathy and ablation procedure may be reasonable to consider.  I did tell her if she were to feel worse in any way to seek urgent medical attention. 2.  Combined systolic and diastolic heart failure: She is euvolemic on exam today. 3.  History of CABG: Continue apixaban and atorvastatin. 5.  Status post permanent pacemaker: Follows with the EP. 6.  Hyperlipidemia: LDL in January was 68. 7.  Hypertension: Systolic blood pressures in the 90s but the patient is completely asymptomatic.  Hold spironolactone and entresto for  now.  Will keep Toprol on for now. 8.  Chronic kidney disease: Continue [place]. 9.  Aortic atherosclerosis: Continue Eliquis, atorvastatin.            Dispo:  No follow-ups on file.      Medication Adjustments/Labs and Tests Ordered: Current medicines are reviewed at length with the patient today.  Concerns regarding medicines are outlined above.  The following changes have been made:  no change   Labs/tests ordered: Orders Placed This Encounter  Procedures   EKG 12-Lead    Medication Changes: No orders of the defined types were placed in this encounter.    Current medicines are reviewed at length with the patient today.  The patient does not have concerns regarding medicines.   History of Present Illness:    FOCUSED PROBLEM LIST:   Coronary artery disease status post CABG consisting of a LIMA to LAD, vein graft to PDA, vein graft to OM, and vein graft to diagonal with concomitant mitral bioprosthetic valve replacement 2013 Atrial fibrillation and atrial flutter on apixaban Cardiomyopathy with ejection fraction around 40% Sinus node dysfunction status post permanent pacemaker 2020 History of stroke 2017 treated with tPA Hypertension Hyperlipidemia CKD stage III Aortic atherosclerosis on chest CT 2023  The patient is a 57 y.o. female with the indicated medical history here for expedited DOD appointment.  The patient was seen by her primary care provider yesterday.  She was noted to be tachycardic.  EKG was performed which demonstrated sinus  tachycardia with a heart rate of 130 bpm.  Her PPM was interrogated and this suggested atrial tachycardia.    She last saw Dr. Graciela Husbands in December 2023.  She has a history of recurrent atrial arrhythmias and was admitted in April 2023 with atrial tachycardia and a rapid rate.  She underwent cardioversion following unsuccessful anti-tachycardia pacing.  She was then readmitted due to atrial fibrillation and treated with amiodarone but  this was stopped due to thyroid issues.  At that appointment her amiodarone was discontinued with a provisional plan to start a low-dose calcium channel blocker if her atrial tachycardia or PVC burden were associated with symptoms as her EF was improved to 40%.  The patient is here with her family.  She says she has been doing very well.  About a week ago she noticed that her pulse was up however.  She denies any presyncope, syncope, paroxysmal nocturnal dyspnea, orthopnea, or peripheral edema.  She is able to do all of her activities of daily living without any issues.  She does not notice that when she is in a tachycardic rhythm and this has been a longstanding issue for her.       Previous Medical History: Past Medical History:  Diagnosis Date   Abdominal pain    ? chronic cholecystitis; admxn to [facility] 9/12 (CT, USN, HIDA done)   Atrial fibrillation (HCC)    Cardiomyopathy    echo 9/12: mild LVH, EF 35-40%, mod MR (difficult to judge /post leaflet restricted), mod BAE, mod RVE, mod TR    Cardiomyopathy (HCC)    a. echo 9/12: mild LVH, EF 35-40%, mod MR (difficult to judge /post leaflet restricted), mod BAE, mod RVE, mod TR. b. Echo 11/2011: mild LVH, EF 55-60%, s/p MV repair w/o sig MR/MS, mildly dilated RV/RA, PASP . // AFL dx 10/2018 - Echo 10/2018: EF 30, diff HK, s/p MV repair with trivial MR, mean MV 7 but not c/w significant mitral stenosis, mild RAE, severe LAE, mod reduced RVSF    Carotid artery disease (HCC)    a. Duplex 06/2015: 50% RECA, 1-39% BICA.   CHF (congestive heart failure) (HCC)    Clotting disorder (HCC)    Coronary artery disease    a. s/p CABG 10/2011 - LIMA-LAD, SVG-PDA, SVG-OM, SVG-diagonal.   Dyslipidemia    Essential hypertension    Heart murmur    Hemifacial spasm    History of Doppler ultrasound    a. Carotid US [facility] in [place], [place]) 7/17: no hemodynamically significant ICA stenosis   Hyperlipidemia    Mitral regurgitation    a. s/p MV  repair 10/2011 - on Coumadin for 3 months afterwards then d/c'd by surgery.   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 age 5 - in setting of cigs and OCPs. //  b. s/p acute L parietal CVA >> tPA - [facility] in [place], [place]   Stroke [facility]) 01/2017   Tobacco abuse      Current Medications: Current Meds  Medication Sig   acetaminophen (TYLENOL) 500 MG tablet Take 500-1,000 mg by mouth every 6 (six) hours as needed for headache.   apixaban (ELIQUIS) 5 MG TABS tablet TAKE ONE TABLET BY MOUTH TWICE A DAY   Ascorbic Acid (VITAMIN C) 1000 MG tablet Take 1,000 mg by mouth 2 (two) times daily.    atorvastatin (  LIPITOR) 80 MG tablet TAKE ONE TABLET BY MOUTH ONE TIME DAILY   dapagliflozin propanediol (FARXIGA) 10 MG TABS tablet Take 1 tablet (10 mg total) by mouth daily before breakfast.   diclofenac Sodium (VOLTAREN ARTHRITIS PAIN) 1 % GEL Apply 2 g topically as needed.   diphenhydrAMINE (BENADRYL) 25 MG tablet Take 25 mg by mouth every 6 (six) hours as needed for allergies.   ferrous sulfate 325 (65 FE) MG tablet Take 1 tablet (325 mg total) by mouth daily with breakfast.   furosemide (LASIX) 40 MG tablet TAKE ONE TABLET BY MOUTH ONE TIME DAILY, TAKE AN EXTRA TABLET AT NIGHT AS NEEDED   Homeopathic Products (ARNICARE ARNICA EX) Apply 1 application. topically as needed (bruises).   lidocaine (LIDODERM) 5 % Place 1 patch onto the skin as needed. Remove & Discard patch within 12 hours or as directed by MD   methocarbamol (ROBAXIN) 500 MG tablet Take 2 tablets (1,000 mg total) by mouth every 8 (eight) hours as needed for muscle spasms.   metoprolol succinate (TOPROL-XL) 100 MG 24 hr tablet Take 1 tablet (100 mg total) by mouth daily. Take with or immediately following a meal.   predniSONE (DELTASONE) 20 MG tablet Take 1 tablet (20 mg total) by mouth daily with breakfast.   sacubitril-valsartan  (ENTRESTO) 49-51 MG Take 1 tablet by mouth 2 (two) times daily.   sodium chloride (OCEAN) 0.65 % SOLN nasal spray Place 1 spray into both nostrils daily as needed (for congestion).   spironolactone (ALDACTONE) 25 MG tablet TAKE ONE TABLET BY MOUTH ONE TIME DAILY   vitamin B-12 (CYANOCOBALAMIN) 1000 MCG tablet Take 1 tablet (1,000 mcg total) by mouth daily.   Current Facility-Administered Medications for the 02/24/23 encounter (Office Visit) with Orbie Pyo, MD  Medication   incobotulinumtoxinA (XEOMIN) 50 units injection 50 Units   incobotulinumtoxinA (XEOMIN) 50 units injection 50 Units   incobotulinumtoxinA (XEOMIN) 50 units injection 50 Units     Allergies:    Azithromycin, Latex, Spinach, and Tape   Social History:   Social History   Tobacco Use   Smoking status: Former    Packs/day: 1.00    Years: 30.00    Additional pack years: 0.00    Total pack years: 30.00    Types: Cigarettes    Quit date: 10/2021    Years since quitting: 1.3   Smokeless tobacco: Never  Vaping Use   Vaping Use: Never used  Substance Use Topics   Alcohol use: Yes    Alcohol/week: 1.0 standard drink of alcohol    Types: 1 Cans of beer per week    Comment: can of beer 1 x week   Drug use: No     Family Hx: Family History  Problem Relation Age of Onset   Hypertension Mother    Stroke Mother    Heart disease Father    COPD Father    Bone cancer Maternal Grandmother    Anesthesia problems Neg Hx    Hypotension Neg Hx    Malignant hyperthermia Neg Hx    Pseudochol deficiency Neg Hx    Colon cancer Neg Hx      Review of Systems:   Please see the history of present illness.    All other systems reviewed and are negative.     EKGs/Labs/Other Test Reviewed:    EKG:    EKG Interpretation Date/Time:  Friday February 24 2023 13:20:25 EDT Ventricular Rate:  141 PR Interval:    QRS Duration:  112 QT Interval:  352 QTC Calculation: 539 R Axis:   81  Text Interpretation: Supraventricular  tachycardia Incomplete right bundle branch block ST & T wave abnormality, consider inferior ischemia ST & T wave abnormality, consider anterior ischemia When compared with ECG of 21-Feb-2023 11:46, No significant change was found Confirmed by Alverda Skeans ([id number]) on 02/24/2023 1:27:44 PM         Prior CV studies:  Cardiac Studies & Procedures     STRESS TESTS  MYOCARDIAL PERFUSION IMAGING 11/01/2022  Narrative   Findings are consistent with no ischemia and no infarction. The study is intermediate risk.   No ST deviation was noted.   LV perfusion is normal. There is no evidence of ischemia. There is no evidence of infarction.   Left ventricular function is abnormal. Global function is moderately reduced. Nuclear stress EF: 44 %. The left ventricular ejection fraction is moderately decreased (30-44%). End diastolic cavity size is normal. End systolic cavity size is mildly enlarged.  No ischemia or infarct Baseline ECG with ICRBBB Intermediate risk based on estimated EF of 44% with global hypokinesis Suggest TTE/MRI correlation   ECHOCARDIOGRAM  ECHOCARDIOGRAM COMPLETE 05/18/2022  Narrative ECHOCARDIOGRAM REPORT    Patient Name:   STALEY ZIMAN Harton Date of Exam: 05/18/2022 Medical Rec #:  [id number]             Height:       68.0 in Accession #:    [id number]            Weight:       165.2 lb Date of Birth:  Jan 16, 1966            BSA:          1.884 m Patient Age:    55 years              BP:           137/81 mmHg Patient Gender: F                     HR:           60 bpm. Exam Location:  Outpatient  Procedure: 2D Echo, 3D Echo, Color Doppler and Cardiac Doppler  Indications:    I50.22 Chronic systolic (congestive) heart failure  History:        Patient has prior history of Echocardiogram examinations, most recent 01/19/2022. 26mm MV Memo 3D Ring placed in 2013.  Sonographer:    Irving Burton Senior RDCS Referring Phys: [id number] DANIEL R BENSIMHON  IMPRESSIONS   1. Left  ventricular ejection fraction, by estimation, is 40%. The left ventricle has moderately decreased function. The left ventricle demonstrates global hypokinesis. Left ventricular diastolic parameters are indeterminate. 2. Right ventricular systolic function is hyperdynamic. The right ventricular size is normal. There is normal pulmonary artery systolic pressure. 3. Left atrial size was moderately dilated. 4. The mitral valve has been repaired/replaced. Mild to moderate mitral valve regurgitation. No evidence of mitral stenosis. The mean mitral valve gradient is 4.0 mmHg. Moderate mitral annular calcification. 5. The aortic valve is normal in structure. Aortic valve regurgitation is not visualized. Aortic valve sclerosis/calcification is present, without any evidence of aortic stenosis. 6. The inferior vena cava is normal in size with greater than 50% respiratory variability, suggesting right atrial pressure of 3 mmHg.  FINDINGS Left Ventricle: Left ventricular ejection fraction, by estimation, is 40%. The left ventricle has moderately decreased function. The left ventricle demonstrates global hypokinesis. The left ventricular internal  cavity size was normal in size. There is no left ventricular hypertrophy. Left ventricular diastolic parameters are indeterminate.  Right Ventricle: The right ventricular size is normal. No increase in right ventricular wall thickness. Right ventricular systolic function is hyperdynamic. There is normal pulmonary artery systolic pressure. The tricuspid regurgitant velocity is 2.19 m/s, and with an assumed right atrial pressure of 3 mmHg, the estimated right ventricular systolic pressure is 22.2 mmHg.  Left Atrium: Left atrial size was moderately dilated.  Right Atrium: Right atrial size was normal in size.  Pericardium: There is no evidence of pericardial effusion.  Mitral Valve: The mitral valve has been repaired/replaced. Moderate mitral annular calcification. Mild  to moderate mitral valve regurgitation. There is a bioprosthetic valve present in the mitral position. No evidence of mitral valve stenosis. MV peak gradient, 13.2 mmHg. The mean mitral valve gradient is 4.0 mmHg.  Tricuspid Valve: The tricuspid valve is normal in structure. Tricuspid valve regurgitation is mild . No evidence of tricuspid stenosis.  Aortic Valve: The aortic valve is normal in structure. Aortic valve regurgitation is not visualized. Aortic valve sclerosis/calcification is present, without any evidence of aortic stenosis.  Pulmonic Valve: The pulmonic valve was normal in structure. Pulmonic valve regurgitation is trivial. No evidence of pulmonic stenosis.  Aorta: The aortic root is normal in size and structure.  Venous: The inferior vena cava is normal in size with greater than 50% respiratory variability, suggesting right atrial pressure of 3 mmHg.  IAS/Shunts: No atrial level shunt detected by color flow Doppler.  Additional Comments: A device lead is visualized.   LEFT VENTRICLE PLAX 2D LVIDd:         5.10 cm      Diastology LVIDs:         3.90 cm      LV e' medial:    4.58 cm/s LV PW:         0.80 cm      LV E/e' medial:  31.9 LV IVS:        0.90 cm      LV e' lateral:   5.78 cm/s LVOT diam:     1.90 cm      LV E/e' lateral: 25.3 LV SV:         64 LV SV Index:   34 LVOT Area:     2.84 cm  3D Volume EF: LV Volumes (MOD)            3D EF:        41 % LV vol d, MOD A2C: 80.3 ml  LV EDV:       177 ml LV vol d, MOD A4C: 135.0 ml LV ESV:       104 ml LV vol s, MOD A2C: 48.1 ml  LV SV:        73 ml LV vol s, MOD A4C: 71.4 ml LV SV MOD A2C:     32.2 ml LV SV MOD A4C:     135.0 ml LV SV MOD BP:      46.4 ml  RIGHT VENTRICLE RV S prime:     7.20 cm/s TAPSE (M-mode): 1.8 cm  LEFT ATRIUM              Index        RIGHT ATRIUM           Index LA diam:        4.30 cm  2.28 cm/m   RA Area:  19.50 cm LA Vol (A2C):   102.0 ml 54.13 ml/m  RA Volume:   57.70 ml   30.62 ml/m LA Vol (A4C):   66.2 ml  35.13 ml/m LA Biplane Vol: 82.7 ml  43.89 ml/m AORTIC VALVE LVOT Vmax:   109.00 cm/s LVOT Vmean:  81.800 cm/s LVOT VTI:    0.227 m  AORTA Ao Root diam: 3.00 cm Ao Asc diam:  3.10 cm  MITRAL VALVE                TRICUSPID VALVE MV Area (PHT): 1.44 cm     TR Peak grad:   19.2 mmHg MV Area VTI:   1.30 cm     TR Vmax:        219.00 cm/s MV Peak grad:  13.2 mmHg MV Mean grad:  4.0 mmHg     SHUNTS MV Vmax:       1.82 m/s     Systemic VTI:  0.23 m MV Vmean:      89.5 cm/s    Systemic Diam: 1.90 cm MV Decel Time: 526 msec MV E velocity: 146.00 cm/s  Arvilla Meres MD Electronically signed by Arvilla Meres MD Signature Date/Time: 05/18/2022/10:43:10 AM    Final   TEE  ECHO TEE 01/19/2022  Narrative TRANSESOPHOGEAL ECHO REPORT    Patient Name:   NAKEESHA MAYHER Stines Date of Exam: 01/19/2022 Medical Rec #:  [id number]             Height:       68.0 in Accession #:    [id number]            Weight:       175.8 lb Date of Birth:  [birth year]            BSA:          1.935 m Patient Age:    55 years              BP:           89/47 mmHg Patient Gender: F                     HR:           61 bpm. Exam Location:  Inpatient  Procedure: Transesophageal Echo, 3D Echo, Cardiac Doppler and Color Doppler  Indications:     Mitral valve insufficiency, unspecified etiology [I34.0 (ICD-10-CM)]  History:         Patient has prior history of Echocardiogram examinations, most recent 12/06/2021. Cardiomyopathy and CHF, Prior CABG, Stroke and Carotid Disease, Mitral Valve Disease, Signs/Symptoms:Murmur; Risk Factors:Former Smoker, Hypertension and Dyslipidemia.  Mitral Valve: 26 mm RING MITRAL MEMO 3D prosthetic annuloplasty ring valve is present in the mitral position. Procedure Date: 10/21/2011.  Sonographer:     Leta Jungling RDCS Referring Phys:  [id number] Ronnald Ramp O'NEAL Diagnosing Phys: Lennie Odor MD  PROCEDURE: After  discussion of the risks and benefits of a TEE, an informed consent was obtained from the patient. TEE procedure time was 30 minutes. The transesophogeal probe was passed without difficulty through the esophogus of the patient. Imaged were obtained with the patient in a left lateral decubitus position. Local oropharyngeal anesthetic was provided with Cetacaine. Sedation performed by different physician. The patient was monitored while under deep sedation. Anesthestetic sedation was provided intravenously by Anesthesiology: 625mg  of Propofol, 100mg  of Lidocaine. Image quality was excellent. The patient's vital signs; including heart rate, blood pressure, and oxygen saturation; remained stable  throughout the procedure. The patient developed no complications during the procedure.  IMPRESSIONS   1. 26 mm annuloplasty ring. MG 4.0 mmHG @ 61 bpm. Mild to moderate mitral stenosis is present likely related to the small annuloplasty ring. Leaflets are mobile. MVA by directly 3D planimetry 1.71 cm2. MVA by VTI 2.17 cm2. Moderate mitral regurgitation is present likely related to restricted movement of the PMVL. 3D VCA 0.21 cm2. The mitral valve has been repaired/replaced. Moderate mitral valve regurgitation. Mild to moderate mitral stenosis. There is a 26 mm RING MITRAL MEMO 3D prosthetic annuloplasty ring present in the mitral position. Procedure Date: 10/21/2011. 2. Left ventricular ejection fraction, by estimation, is 30 to 35%. The left ventricle has moderately decreased function. The left ventricle demonstrates global hypokinesis. The left ventricular internal cavity size was mildly dilated. 3. Right ventricular systolic function is moderately reduced. The right ventricular size is severely enlarged. There is moderately elevated pulmonary artery systolic pressure. 4. Left atrial size was severely dilated. No left atrial/left atrial appendage thrombus was detected. The LAA emptying velocity was 23  cm/s. 5. Right atrial size was severely dilated. 6. Moderate tricuspid regurgitation. 3D VCA 0.27 cm2. Tricuspid valve regurgitation is moderate. 7. The aortic valve is tricuspid. Aortic valve regurgitation is not visualized. No aortic stenosis is present. 8. There is Moderate (Grade III) layered plaque involving the descending aorta.  FINDINGS Left Ventricle: Left ventricular ejection fraction, by estimation, is 30 to 35%. The left ventricle has moderately decreased function. The left ventricle demonstrates global hypokinesis. The left ventricular internal cavity size was mildly dilated.  Right Ventricle: The right ventricular size is severely enlarged. No increase in right ventricular wall thickness. Right ventricular systolic function is moderately reduced. There is moderately elevated pulmonary artery systolic pressure. The tricuspid regurgitant velocity is 3.27 m/s, and with an assumed right atrial pressure of 15 mmHg, the estimated right ventricular systolic pressure is 57.8 mmHg.  Left Atrium: Left atrial size was severely dilated. No left atrial/left atrial appendage thrombus was detected. The LAA emptying velocity was 23 cm/s.  Right Atrium: Right atrial size was severely dilated.  Pericardium: There is no evidence of pericardial effusion.  Mitral Valve: 26 mm annuloplasty ring. MG 4.0 mmHG @ 61 bpm. Mild to moderate mitral stenosis is present likely related to the small annuloplasty ring. Leaflets are mobile. MVA by directly 3D planimetry 1.71 cm2. MVA by VTI 2.17 cm2. Moderate mitral regurgitation is present likely related to restricted movement of the PMVL. 3D VCA 0.21 cm2. The mitral valve has been repaired/replaced. Moderate mitral valve regurgitation. There is a 26 mm RING MITRAL MEMO 3D prosthetic annuloplasty ring present in the mitral position. Procedure Date: 10/21/2011. Mild to moderate mitral valve stenosis. MV peak gradient, 12.5 mmHg. The mean mitral valve gradient is  4.0 mmHg with average heart rate of 61 bpm.  Tricuspid Valve: Moderate tricuspid regurgitation. 3D VCA 0.27 cm2. The tricuspid valve is grossly normal. Tricuspid valve regurgitation is moderate . No evidence of tricuspid stenosis.  Aortic Valve: The aortic valve is tricuspid. Aortic valve regurgitation is not visualized. No aortic stenosis is present.  Pulmonic Valve: The pulmonic valve was grossly normal. Pulmonic valve regurgitation is not visualized. No evidence of pulmonic stenosis.  Aorta: The aortic root and ascending aorta are structurally normal, with no evidence of dilitation. There is moderate (Grade III) layered plaque involving the descending aorta.  Venous: The left upper pulmonary vein, left lower pulmonary vein, right upper pulmonary vein and right lower  pulmonary vein are normal.  IAS/Shunts: The atrial septum is grossly normal.   LEFT VENTRICLE PLAX 2D LVOT diam:     2.10 cm LV SV:         98 LV SV Index:   51 LVOT Area:     3.46 cm  3D Volume EF LV 3D EF:    28.30 % LV 3D EDV:   70400.00 mm LV 3D ESV:   50500.00 mm LV 3D SV:    19900.00 mm  AORTIC VALVE LVOT Vmax:   110.00 cm/s LVOT Vmean:  77.500 cm/s LVOT VTI:    0.282 m  AORTA Ao Root diam: 2.73 cm Ao Asc diam:  3.06 cm  MITRAL VALVE              TRICUSPID VALVE MV Area (PHT): 1.54 cm   TV Peak grad:   2.5 mmHg MV Area VTI:   2.17 cm   TV Mean grad:   1.0 mmHg MV Peak grad:  12.5 mmHg  TV Vmax:        0.79 m/s MV Mean grad:  4.0 mmHg   TV Vmean:       52.7 cm/s MV Vmax:       1.77 m/s   TV VTI:         0.23 msec MV Vmean:      84.5 cm/s  TR Peak grad:   42.8 mmHg MV VTI:        0.45 m     TR Mean grad:   24.0 mmHg MR Peak grad: 90.2 mmHg   TR Vmax:        327.00 cm/s MR Mean grad: 56.5 mmHg   TR Vmean:       229.0 cm/s MR Vmax:      475.00 cm/s MR Vmean:     353.0 cm/s  SHUNTS Systemic VTI:  0.28 m Systemic Diam: 2.10 cm  Lennie Odor MD Electronically signed by Lennie Odor  MD Signature Date/Time: 01/19/2022/2:23:12 PM    Final   MONITORS  CARDIAC EVENT MONITOR 07/18/2022  Narrative Indication:palps  Duration: 30d  Findings HR  avg 68  Min 58-Max 111 PVCs 2% PACs 3% AFib none VT Nonsustained 13 episodes; fastest 145 bpm for 7 beats; longest for 11.5 secs at 123 bpm  Symptoms: none>  Triggered: none   Conclusions: VT nonsustained may be responsible for symptomatic  palpitaitons but no symptoms noted   Recommendations Continue current therapy           Other studies Reviewed: Review of the additional studies/records demonstrates: Chest CT scan 2023 demonstrates aortic atherosclerosis  Recent Labs: 05/18/2022: ALT 28; B Natriuretic Peptide 387.9; Hemoglobin 14.2; Platelets 202 08/29/2022: TSH 2.920 11/15/2022: BUN 35; Creatinine, Ser 1.76; Potassium 4.8; Sodium 140   Recent Lipid Panel Lab Results  Component Value Date/Time   CHOL 131 08/29/2022 08:45 AM   TRIG 83 08/29/2022 08:45 AM   HDL 47 08/29/2022 08:45 AM   LDLCALC 68 08/29/2022 08:45 AM    Risk Assessment/Calculations:     CHA2DS2-VASc Score = 6   This indicates a 9.7% annual risk of stroke. The patient's score is based upon: CHF History: 1 HTN History: 1 Diabetes History: 0 Stroke History: 2 Vascular Disease History: 1 Age Score: 0 Gender Score: 1              Physical Exam:    VS:  BP 90/68   Pulse (!) 141   Ht 5' 8.5" (1.74 m)  Wt 183 lb 6.4 oz (83.2 kg)   LMP 05/18/2015 Comment: went for 3 week after skipping two months   SpO2 98%   BMI 27.48 kg/m    Wt Readings from Last 3 Encounters:  02/24/23 183 lb 6.4 oz (83.2 kg)  02/21/23 183 lb 9.6 oz (83.3 kg)  11/15/22 179 lb 9.6 oz (81.5 kg)    GENERAL:  No apparent distress, AOx3 HEENT:  No carotid bruits, +2 carotid impulses, no scleral icterus CAR: Tachycardic and regular no murmurs, gallops, rubs, or thrills RES:  Clear to auscultation bilaterally ABD:  Soft, nontender, nondistended,  positive bowel sounds x 4 VASC:  +2 radial pulses, +2 carotid pulses, palpable pedal pulses NEURO:  CN 2-12 grossly intact; motor and sensory grossly intact PSYCH:  No active depression or anxiety EXT:  No edema, ecchymosis, or cyanosis  Signed, Orbie Pyo, MD  02/24/2023 1:50 PM    [facility] [address] [place], [place], [place]  [address] Phone: [phone]; Fax: [phone]   Note:  This document was prepared using Dragon voice recognition software and may include unintentional dictation errors.

## 2023-02-23 LAB — CK: Total CK: 71 U/L (ref 32–182)

## 2023-02-23 LAB — ALDOLASE: Aldolase: 3.5 U/L (ref 3.3–10.3)

## 2023-02-24 ENCOUNTER — Ambulatory Visit: Payer: Disability Insurance | Attending: Internal Medicine | Admitting: Internal Medicine

## 2023-02-24 ENCOUNTER — Encounter: Payer: Self-pay | Admitting: Internal Medicine

## 2023-02-24 VITALS — BP 90/68 | HR 141 | Ht 68.5 in | Wt 183.4 lb

## 2023-02-24 DIAGNOSIS — I700 Atherosclerosis of aorta: Secondary | ICD-10-CM

## 2023-02-24 DIAGNOSIS — E785 Hyperlipidemia, unspecified: Secondary | ICD-10-CM

## 2023-02-24 DIAGNOSIS — I480 Paroxysmal atrial fibrillation: Secondary | ICD-10-CM

## 2023-02-24 DIAGNOSIS — I5042 Chronic combined systolic (congestive) and diastolic (congestive) heart failure: Secondary | ICD-10-CM

## 2023-02-24 DIAGNOSIS — I4719 Other supraventricular tachycardia: Secondary | ICD-10-CM | POA: Diagnosis not present

## 2023-02-24 DIAGNOSIS — Z950 Presence of cardiac pacemaker: Secondary | ICD-10-CM

## 2023-02-24 DIAGNOSIS — Z951 Presence of aortocoronary bypass graft: Secondary | ICD-10-CM | POA: Diagnosis not present

## 2023-02-24 DIAGNOSIS — I10 Essential (primary) hypertension: Secondary | ICD-10-CM

## 2023-02-24 NOTE — Patient Instructions (Addendum)
Medication Instructions:   Hold entresto and spironolactone until further notice.   *If you need a refill on your cardiac medications before your next appointment, please call your pharmacy*   Lab Work: none If you have labs (blood work) drawn today and your tests are completely normal, you will receive your results only by: MyChart Message (if you have MyChart) OR A paper copy in the mail If you have any lab test that is abnormal or we need to change your treatment, we will call you to review the results.   Testing/Procedures: none   Follow-Up: At [facility], you and your health needs are our priority.  As part of our continuing mission to provide you with exceptional heart care, we have created designated Provider Care Teams.  These Care Teams include your primary Cardiologist (physician) and Advanced Practice Providers (APPs -  Physician Assistants and Nurse Practitioners) who all work together to provide you with the care you need, when you need it.  We recommend signing up for the patient portal called "MyChart".  Sign up information is provided on this After Visit Summary.  MyChart is used to connect with patients for Virtual Visits (Telemedicine).  Patients are able to view lab/test results, encounter notes, upcoming appointments, etc.  Non-urgent messages can be sent to your provider as well.   To learn more about what you can do with MyChart, go to [email or web address].    Your next appointment:    July 9, at 9:00 Provider:   Dr Graciela Husbands    Other Instructions

## 2023-02-28 ENCOUNTER — Encounter: Payer: Self-pay | Admitting: Internal Medicine

## 2023-02-28 ENCOUNTER — Telehealth: Payer: Self-pay | Admitting: Neurology

## 2023-02-28 ENCOUNTER — Ambulatory Visit: Payer: Medicaid Other | Attending: Internal Medicine | Admitting: Internal Medicine

## 2023-02-28 ENCOUNTER — Encounter: Payer: Self-pay | Admitting: Neurology

## 2023-02-28 ENCOUNTER — Ambulatory Visit (INDEPENDENT_AMBULATORY_CARE_PROVIDER_SITE_OTHER): Payer: Medicaid Other | Admitting: Neurology

## 2023-02-28 VITALS — BP 132/87 | HR 135 | Ht 68.5 in | Wt 183.0 lb

## 2023-02-28 VITALS — BP 154/77 | HR 70 | Ht 68.5 in | Wt 183.5 lb

## 2023-02-28 DIAGNOSIS — G5131 Clonic hemifacial spasm, right: Secondary | ICD-10-CM | POA: Diagnosis not present

## 2023-02-28 DIAGNOSIS — Z950 Presence of cardiac pacemaker: Secondary | ICD-10-CM

## 2023-02-28 DIAGNOSIS — I4719 Other supraventricular tachycardia: Secondary | ICD-10-CM

## 2023-02-28 DIAGNOSIS — I480 Paroxysmal atrial fibrillation: Secondary | ICD-10-CM

## 2023-02-28 LAB — CUP PACEART INCLINIC DEVICE CHECK
Date Time Interrogation Session: 20240709122212
Implantable Lead Connection Status: 753985
Implantable Lead Connection Status: 753985
Implantable Lead Implant Date: 20201103
Implantable Lead Implant Date: 20201103
Implantable Lead Location: 753859
Implantable Lead Location: 753860
Implantable Pulse Generator Implant Date: 20201103
Pulse Gen Model: 2272
Pulse Gen Serial Number: 9174760

## 2023-02-28 MED ORDER — DRONEDARONE HCL 400 MG PO TABS: 400.0000 mg | ORAL_TABLET | Freq: Two times a day (BID) | ORAL | 3 refills | Status: DC

## 2023-02-28 MED ORDER — ENTRESTO 24-26 MG PO TABS: 1.0000 | ORAL_TABLET | Freq: Two times a day (BID) | ORAL | 3 refills | Status: DC

## 2023-02-28 MED ORDER — INCOBOTULINUMTOXINA 50 UNITS IM SOLR
50.00 [IU] | Freq: Once | INTRAMUSCULAR | Status: AC
Start: 2023-02-28 — End: 2023-02-28
  Administered 2023-02-28: 50 [IU] via INTRAMUSCULAR

## 2023-02-28 NOTE — Progress Notes (Signed)
Patient Care Team: Hoy Register, MD as PCP - General (Family Medicine) Pricilla Riffle, MD as PCP - Cardiology (Cardiology) Duke Salvia, MD as PCP - Electrophysiology (Cardiology) Avel Peace, MD as Consulting Physician (General Surgery)   HPI  CC atrial fib  Ashley [name] is a 57 y.o. female seen in followup for atrial flutter/atrial fibrillation and sinus node dysfunction prompting pacemaker implantation 11/20 [facility].  Has ischemic heart disease with bypass surgery 2013 with mitral valve annuloplasty for also mitral valve prolapse.  Frequent PVCs  Recurrent tachypalpitations.  Discussions regarding antiarrhythmic therapy versus ablation chose dofetilide.  QT prolongation precluded its continuation and she was started on amiodarone.  Anticoagulations with Coumadin because of cost.  Prior stroke at an outside hospital treated tPA 2017  Recurrent atrial arrhythmias  admitted 4/23 with atrial tachycardia and rapid rate and underwent cardioversion following unsuccessful ATP.  Readmitted and treated with amiodarone for recurrent atrial fibrillation.  Amio stopped 7/23 2/2 hyperthyroidism, Rx with prednisone x 4 weeks   Followed by CHF-DB  Saw RU 10/23 Palps>>30 d monitor  ( nothing detected on device)   Largely unaware tachycardia.  Some variable dyspnea.  The patient denies chest pain, nocturnal dyspnea, orthopnea or peripheral edema.  There have been no palpitations, or syncope.  Complains of some lightheadedness  Antiarrhythmics Date Reason stopped  dofetilide  LONG QT  amiodarone / hyperthyroidism     .            DATE TEST EF    3/13 Echo   35-40 %    4/13 Echo  55-65%    3/20  Echo  30% LA 51//51  7/20 Echo  60-65% No LA measurement  1/23 Echo  50-55% LAE 51  mL/m Pressure half-time increased from 70--132 ms  4/23 Echo  30-35%   5/23 TEE 30-35% RV severely enlarged and mod dysfn PA pressure 58 TR mod MS-mild-moderate  9/23 Echo   40% RV "hyperdynamic...size is normal" TR mild MS none LA vol 45 ml/m2  3/24 MYOVIEW  44% Normal perfusion       Date Cr K Hgb  3/20    11.7 (6/20)  9/20 0.84 4.0    12/20 0.9 3.9   2/21 1.01 4.7   7/22 0.96 4.3 12.7    9/23 1.46 4.8 14.2  3/24 1.76 4.8     Thromboembolic risk factors ( HTN-1, TIA/CVA-2, Vasc disease -1, CHF-1, Gender-1) for a CHADSVASc Score of >=6       Past Medical History:  Diagnosis Date   Abdominal pain    ? chronic cholecystitis; admxn to [facility] 9/12 (CT, USN, HIDA done)   Atrial fibrillation (HCC)    Cardiomyopathy    echo 9/12: mild LVH, EF 35-40%, mod MR (difficult to judge /post leaflet restricted), mod BAE, mod RVE, mod TR    Cardiomyopathy (HCC)    a. echo 9/12: mild LVH, EF 35-40%, mod MR (difficult to judge /post leaflet restricted), mod BAE, mod RVE, mod TR. b. Echo 11/2011: mild LVH, EF 55-60%, s/p MV repair w/o sig MR/MS, mildly dilated RV/RA, PASP . // AFL dx 10/2018 - Echo 10/2018: EF 30, diff HK, s/p MV repair with trivial MR, mean MV 7 but not c/w significant mitral stenosis, mild RAE, severe LAE, mod reduced RVSF    Carotid artery disease (HCC)    a. Duplex 06/2015: 50% RECA, 1-39% BICA.   CHF (congestive heart failure) (HCC)    Clotting disorder (  HCC)    Coronary artery disease    a. s/p CABG 10/2011 - LIMA-LAD, SVG-PDA, SVG-OM, SVG-diagonal.   Dyslipidemia    Essential hypertension    Heart murmur    Hemifacial spasm    History of Doppler ultrasound    a. Carotid US [facility] in [place], [place]) 7/17: no hemodynamically significant ICA stenosis   Hyperlipidemia    Mitral regurgitation    a. s/p MV repair 10/2011 - on Coumadin for 3 months afterwards then d/c'd by surgery.   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 age 64 - in setting of cigs and OCPs. //  b. s/p acute L parietal CVA >> tPA - [facility] in [place], [place]    Stroke [facility]) 01/2017   Tobacco abuse     Past Surgical History:  Procedure Laterality Date   CARDIAC CATHETERIZATION     CARDIOVERSION N/A 01/25/2019   Procedure: CARDIOVERSION;  Surgeon: Chrystie Nose, MD;  Location: [facility] ENDOSCOPY;  Service: Cardiovascular;  Laterality: N/A;   CARDIOVERSION N/A 06/14/2019   Procedure: CARDIOVERSION;  Surgeon: Chrystie Nose, MD;  Location: [facility] ENDOSCOPY;  Service: Cardiovascular;  Laterality: N/A;   CESAREAN SECTION  1996   twins   CORONARY ARTERY BYPASS GRAFT  10/21/2011   Procedure: CORONARY ARTERY BYPASS GRAFTING (CABG);  Surgeon: Purcell Nails, MD;  Location: [facility] OR;  Service: Open Heart Surgery;  Laterality: N/A;  cabg x four, using right leg greater saphenous vein harvested endoscopically   DENTAL SURGERY     MITRAL VALVE REPAIR  10/21/2011   Procedure: MITRAL VALVE REPAIR (MVR);  Surgeon: Purcell Nails, MD;  Location: [facility] OR;  Service: Open Heart Surgery;  Laterality: N/A;   PACEMAKER IMPLANT N/A 06/25/2019   Procedure: PACEMAKER IMPLANT;  Surgeon: Hillis Range, MD;  Location: [facility] INVASIVE CV LAB;  Service: Cardiovascular;  Laterality: N/A;   TEE WITHOUT CARDIOVERSION  06/30/2011   Procedure: TRANSESOPHAGEAL ECHOCARDIOGRAM (TEE);  Surgeon: Pricilla Riffle, MD;  Location: [facility] ENDOSCOPY;  Service: Cardiovascular;  Laterality: N/A;   TEE WITHOUT CARDIOVERSION N/A 01/19/2022   Procedure: TRANSESOPHAGEAL ECHOCARDIOGRAM (TEE);  Surgeon: Sande Rives, MD;  Location: [facility] ENDOSCOPY;  Service: Cardiovascular;  Laterality: N/A;   TONSILLECTOMY     TUBAL LIGATION      Current Meds  Medication Sig   acetaminophen (TYLENOL) 500 MG tablet Take 500-1,000 mg by mouth every 6 (six) hours as needed for headache.   apixaban (ELIQUIS) 5 MG TABS tablet TAKE ONE TABLET BY MOUTH TWICE A DAY   Ascorbic Acid (VITAMIN C) 1000 MG tablet Take 1,000 mg by mouth 2 (two) times daily.    atorvastatin (LIPITOR) 80 MG tablet TAKE ONE TABLET BY MOUTH ONE TIME DAILY    dapagliflozin propanediol (FARXIGA) 10 MG TABS tablet Take 1 tablet (10 mg total) by mouth daily before breakfast.   diclofenac Sodium (VOLTAREN ARTHRITIS PAIN) 1 % GEL Apply 2 g topically as needed.   diphenhydrAMINE (BENADRYL) 25 MG tablet Take 25 mg by mouth every 6 (six) hours as needed for allergies.   ferrous sulfate 325 (65 FE) MG tablet Take 1 tablet (325 mg total) by mouth daily with breakfast.   furosemide (LASIX) 40 MG tablet TAKE ONE TABLET BY MOUTH ONE TIME DAILY, TAKE AN EXTRA TABLET AT NIGHT AS NEEDED   Homeopathic Products (ARNICARE ARNICA EX) Apply  1 application. topically as needed (bruises).   lidocaine (LIDODERM) 5 % Place 1 patch onto the skin as needed. Remove & Discard patch within 12 hours or as directed by MD   methocarbamol (ROBAXIN) 500 MG tablet Take 2 tablets (1,000 mg total) by mouth every 8 (eight) hours as needed for muscle spasms.   metoprolol succinate (TOPROL-XL) 100 MG 24 hr tablet Take 1 tablet (100 mg total) by mouth daily. Take with or immediately following a meal.   sacubitril-valsartan (ENTRESTO) 49-51 MG Take 1 tablet by mouth 2 (two) times daily.   sodium chloride (OCEAN) 0.65 % SOLN nasal spray Place 1 spray into both nostrils daily as needed (for congestion).   spironolactone (ALDACTONE) 25 MG tablet TAKE ONE TABLET BY MOUTH ONE TIME DAILY   vitamin B-12 (CYANOCOBALAMIN) 1000 MCG tablet Take 1 tablet (1,000 mcg total) by mouth daily.   Current Facility-Administered Medications for the 02/28/23 encounter (Office Visit) with Duke Salvia, MD  Medication   incobotulinumtoxinA (XEOMIN) 50 units injection 50 Units   incobotulinumtoxinA (XEOMIN) 50 units injection 50 Units   incobotulinumtoxinA (XEOMIN) 50 units injection 50 Units    Allergies  Allergen Reactions   Azithromycin Nausea Only   Latex Rash   Spinach Hives    Cooked    Tape Dermatitis and Other (See Comments)    PLEASE USE PAPER TAPE!! AFFECTS SKIN BADLY!!!!      Review of Systems  negative except from HPI and PMH  Physical Exam BP 132/87   Pulse (!) 135   Ht 5' 8.5" (1.74 m)   Wt 183 lb (83 kg)   LMP 05/18/2015 Comment: went for 3 week after skipping two months   SpO2 99%   BMI 27.42 kg/m  Well developed and well nourished in no acute distress HENT normal Neck supple with JVP-flat Clear Device pocket well healed; without hematoma or erythema.  There is no tethering  Regular rate and rhythm, no  gallop No/ murmur Abd-soft with active BS No Clubbing cyanosis  edema Skin-warm and dry A & Oriented  Grossly normal sensory and motor function  ECG atrial tachycardia at 134 Intervals 08/[birth year]/1937 P wave negative in lead to 3 and F  Device function is normal. 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  Recurrent tachycardia has a  P wave axis and the short AV interval recorded on the device suggesting this may be AV reentry by way of a slow slow pathway or a fast slow pathway, the former suggested by the variable rates and the variable AV interval as noted above.  She is also had atrial fibrillation.  She has been intolerant of multiple antiarrhythmics including dofetilide and amiodarone, in the context of her improved cardiomyopathy, in the intermediate term, we will try dronaderone.  We also discussed again catheter ablation to which she is now agreeable.   Will have her see Dr. Lalla Brothers.  Blood pressure was low, when she saw Dr. A-T.  He had her hold her Entresto and spironolactone, we will resume the [facility] today 24/26 with further up titration.  She is euvolemic.   No clinical bleeding.  Will continue Eliquis.  She needs repeat CBC

## 2023-02-28 NOTE — Progress Notes (Signed)
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 female   Right hemifacial spasm 50 units xeomin is dissolved into 1cc NS.  Under EMG guidance 2.5 units at each injection site    DIAGNOSTIC DATA (LABS, IMAGING, TESTING) - I reviewed patient records, labs, notes, testing and imaging myself where available.   MEDICAL HISTORY:  Natile Selinsky Mings is a 57 years old female, seen in request by her primary physician Dr. Alvis Lemmings, Odette Horns for evaluation of right hemifacial spasm, botulinum toxin injection.   I reviewed and summarized the referring note. PMHX. Stroke Right hemifacial spasm Atrial fibrillation HTN CHF CAD Cardiomyopathy, moderate MR, s/p MV repair Smoke Pacemaker placement   She has history of stroke, multiple cardiac condition, atrial fibrillation, taking Eliquis  She started to have intermittent right hemifacial spasm since 2013, gradually getting worse, I saw her in 2017, had low dose xeomin injection, which was helpful,  lost follow up since last injection in Dec 2018 due to insurance reasons.   Now she has frequent right facial spasm, hope to receive botulinum toxin injection again, understanding potential risk of injection,     PHYSICAL EXAM:   Vitals:   02/28/23 1050  BP: (!) 154/77  Pulse: 70  Weight: 183 lb 8 oz (83.2 kg)  Height: 5' 8.5" (1.74 m)     Body mass index is 27.5 kg/m.  PHYSICAL EXAMNIATION:  Gen: NAD, conversant, well nourised, well groomed                     Cardiovascular: Regular rate rhythm, no peripheral edema, warm, nontender. Eyes: Conjunctivae clear without exudates or hemorrhage Neck: Supple, no carotid bruits. Pulmonary: Clear to auscultation bilaterally   NEUROLOGICAL EXAM:  MENTAL STATUS: Speech/cognition: Awake, alert, oriented to history taking and casual  conversation CRANIAL NERVES: CN II: Visual fields are full to confrontation. Pupils are round equal and briskly reactive to light. CN III, IV, VI: extraocular movement are normal. No ptosis. CN V: Facial sensation is intact to light touch CN VII: Face is symmetric with normal eye closure, frequent right hemifacial spasm, mainly involving right orbicularis oculi, some right frontalis muscles CN VIII: Hearing is normal to causal conversation. CN IX, X: Phonation is normal. CN XI: Head turning and shoulder shrug are intact  MOTOR: There is no pronator drift of out-stretched arms. Muscle bulk and tone are normal. Muscle strength is normal.  REFLEXES: Reflexes are 2+ and symmetric at the biceps, triceps, knees, and ankles. Plantar responses are flexor.  SENSORY: Intact to light touch, pinprick and vibratory sensation are intact in fingers and toes.  COORDINATION: There is no trunk or limb dysmetria noted.  GAIT/STANCE: Posture is normal. Gait is steady    REVIEW OF SYSTEMS:  Full 14 system review of systems performed and notable only for as above All other review of systems were negative.   ALLERGIES: Allergies  Allergen Reactions   Azithromycin Nausea Only   Latex Rash   Spinach Hives    Cooked    Tape Dermatitis and Other (See Comments)    PLEASE USE PAPER TAPE!! AFFECTS SKIN BADLY!!!!    HOME MEDICATIONS: Current Outpatient Medications  Medication Sig Dispense Refill   acetaminophen (TYLENOL) 500 MG tablet Take 500-1,000 mg by mouth every 6 (six) hours as needed for headache.  apixaban (ELIQUIS) 5 MG TABS tablet TAKE ONE TABLET BY MOUTH TWICE A DAY 180 tablet 1   Ascorbic Acid (VITAMIN C) 1000 MG tablet Take 1,000 mg by mouth 2 (two) times daily.      atorvastatin (LIPITOR) 80 MG tablet TAKE ONE TABLET BY MOUTH ONE TIME DAILY 90 tablet 3   dapagliflozin propanediol (FARXIGA) 10 MG TABS tablet Take 1 tablet (10 mg total) by mouth daily before breakfast. 30 tablet 3    diclofenac Sodium (VOLTAREN ARTHRITIS PAIN) 1 % GEL Apply 2 g topically as needed.     diphenhydrAMINE (BENADRYL) 25 MG tablet Take 25 mg by mouth every 6 (six) hours as needed for allergies.     dronedarone (MULTAQ) 400 MG tablet Take 1 tablet (400 mg total) by mouth 2 (two) times daily with a meal. 180 tablet 3   ferrous sulfate 325 (65 FE) MG tablet Take 1 tablet (325 mg total) by mouth daily with breakfast. 30 tablet 3   furosemide (LASIX) 40 MG tablet TAKE ONE TABLET BY MOUTH ONE TIME DAILY, TAKE AN EXTRA TABLET AT NIGHT AS NEEDED 45 tablet 3   Homeopathic Products (ARNICARE ARNICA EX) Apply 1 application. topically as needed (bruises).     lidocaine (LIDODERM) 5 % Place 1 patch onto the skin as needed. Remove & Discard patch within 12 hours or as directed by MD     methocarbamol (ROBAXIN) 500 MG tablet Take 2 tablets (1,000 mg total) by mouth every 8 (eight) hours as needed for muscle spasms. 90 tablet 0   metoprolol succinate (TOPROL-XL) 100 MG 24 hr tablet Take 1 tablet (100 mg total) by mouth daily. Take with or immediately following a meal. 90 tablet 3   sacubitril-valsartan (ENTRESTO) 24-26 MG Take 1 tablet by mouth 2 (two) times daily. 90 tablet 3   sodium chloride (OCEAN) 0.65 % SOLN nasal spray Place 1 spray into both nostrils daily as needed (for congestion).     vitamin B-12 (CYANOCOBALAMIN) 1000 MCG tablet Take 1 tablet (1,000 mcg total) by mouth daily.     Current Facility-Administered Medications  Medication Dose Route Frequency Provider Last Rate Last Admin   incobotulinumtoxinA (XEOMIN) 50 units injection 50 Units  50 Units Intramuscular Q90 days Levert Feinstein, MD   50 Units at 11/30/16 1546   incobotulinumtoxinA (XEOMIN) 50 units injection 50 Units  50 Units Intramuscular Q90 days Levert Feinstein, MD   50 Units at 07/25/17 0843   incobotulinumtoxinA (XEOMIN) 50 units injection 50 Units  50 Units Intramuscular Q90 days Levert Feinstein, MD   50 Units at 07/26/17 1217    PAST MEDICAL  HISTORY: Past Medical History:  Diagnosis Date   Abdominal pain    ? chronic cholecystitis; admxn to [facility] 9/12 (CT, USN, HIDA done)   Atrial fibrillation (HCC)    Cardiomyopathy    echo 9/12: mild LVH, EF 35-40%, mod MR (difficult to judge /post leaflet restricted), mod BAE, mod RVE, mod TR    Cardiomyopathy (HCC)    a. echo 9/12: mild LVH, EF 35-40%, mod MR (difficult to judge /post leaflet restricted), mod BAE, mod RVE, mod TR. b. Echo 11/2011: mild LVH, EF 55-60%, s/p MV repair w/o sig MR/MS, mildly dilated RV/RA, PASP . // AFL dx 10/2018 - Echo 10/2018: EF 30, diff HK, s/p MV repair with trivial MR, mean MV 7 but not c/w significant mitral stenosis, mild RAE, severe LAE, mod reduced RVSF    Carotid artery disease (HCC)    a. Duplex 06/2015:  50% RECA, 1-39% BICA.   CHF (congestive heart failure) (HCC)    Clotting disorder (HCC)    Coronary artery disease    a. s/p CABG 10/2011 - LIMA-LAD, SVG-PDA, SVG-OM, SVG-diagonal.   Dyslipidemia    Essential hypertension    Heart murmur    Hemifacial spasm    History of Doppler ultrasound    a. Carotid US [facility] in [place], [place]) 7/17: no hemodynamically significant ICA stenosis   Hyperlipidemia    Mitral regurgitation    a. s/p MV repair 10/2011 - on Coumadin for 3 months afterwards then d/c'd by surgery.   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 age 37 - in setting of cigs and OCPs. //  b. s/p acute L parietal CVA >> tPA - [facility] in [place], [place]   Stroke [facility]) 01/2017   Tobacco abuse     PAST SURGICAL HISTORY: Past Surgical History:  Procedure Laterality Date   CARDIAC CATHETERIZATION     CARDIOVERSION N/A 01/25/2019   Procedure: CARDIOVERSION;  Surgeon: Chrystie Nose, MD;  Location: [facility] ENDOSCOPY;  Service: Cardiovascular;  Laterality: N/A;   CARDIOVERSION N/A 06/14/2019   Procedure: CARDIOVERSION;  Surgeon:  Chrystie Nose, MD;  Location: [facility] ENDOSCOPY;  Service: Cardiovascular;  Laterality: N/A;   CESAREAN SECTION  1996   twins   CORONARY ARTERY BYPASS GRAFT  10/21/2011   Procedure: CORONARY ARTERY BYPASS GRAFTING (CABG);  Surgeon: Purcell Nails, MD;  Location: [facility] OR;  Service: Open Heart Surgery;  Laterality: N/A;  cabg x four, using right leg greater saphenous vein harvested endoscopically   DENTAL SURGERY     MITRAL VALVE REPAIR  10/21/2011   Procedure: MITRAL VALVE REPAIR (MVR);  Surgeon: Purcell Nails, MD;  Location: [facility] OR;  Service: Open Heart Surgery;  Laterality: N/A;   PACEMAKER IMPLANT N/A 06/25/2019   Procedure: PACEMAKER IMPLANT;  Surgeon: Hillis Range, MD;  Location: [facility] INVASIVE CV LAB;  Service: Cardiovascular;  Laterality: N/A;   TEE WITHOUT CARDIOVERSION  06/30/2011   Procedure: TRANSESOPHAGEAL ECHOCARDIOGRAM (TEE);  Surgeon: Pricilla Riffle, MD;  Location: [facility] ENDOSCOPY;  Service: Cardiovascular;  Laterality: N/A;   TEE WITHOUT CARDIOVERSION N/A 01/19/2022   Procedure: TRANSESOPHAGEAL ECHOCARDIOGRAM (TEE);  Surgeon: Sande Rives, MD;  Location: [facility] ENDOSCOPY;  Service: Cardiovascular;  Laterality: N/A;   TONSILLECTOMY     TUBAL LIGATION      FAMILY HISTORY: Family History  Problem Relation Age of Onset   Hypertension Mother    Stroke Mother    Heart disease Father    COPD Father    Bone cancer Maternal Grandmother    Anesthesia problems Neg Hx    Hypotension Neg Hx    Malignant hyperthermia Neg Hx    Pseudochol deficiency Neg Hx    Colon cancer Neg Hx     SOCIAL HISTORY: Social History   Socioeconomic History   Marital status: Widowed    Spouse name: Not on file   Number of children: 2   Years of education: Not on file   Highest education level: Bachelor's degree (e.g., BA, AB, BS)  Occupational History   Occupation: Payroll/Paperwork    Comment: Owns a Contractor.   Occupation: Disability    Comment: since march 2023  Tobacco Use    Smoking status: Former    Packs/day: 1.00  Years: 30.00    Additional pack years: 0.00    Total pack years: 30.00    Types: Cigarettes    Quit date: 10/2021    Years since quitting: 1.3   Smokeless tobacco: Never  Vaping Use   Vaping Use: Never used  Substance and Sexual Activity   Alcohol use: Yes    Alcohol/week: 1.0 standard drink of alcohol    Types: 1 Cans of beer per week    Comment: can of beer 1 x week   Drug use: No   Sexual activity: Yes  Other Topics Concern   Not on file  Social History Narrative   Lives at home with husband and son.   Right-handed.   2 cups caffeine daily.   Social Determinants of Health   Financial Resource Strain: Not on file  Food Insecurity: No Food Insecurity (02/17/2023)   Hunger Vital Sign    Worried About Running Out of Food in the Last Year: Never true    Ran Out of Food in the Last Year: Never true  Transportation Needs: No Transportation Needs (02/17/2023)   PRAPARE - Administrator, Civil Service (Medical): No    Lack of Transportation (Non-Medical): No  Physical Activity: Insufficiently Active (02/17/2023)   Exercise Vital Sign    Days of Exercise per Week: 4 days    Minutes of Exercise per Session: 30 min  Stress: No Stress Concern Present (02/17/2023)   [employer] of Occupational Health - Occupational Stress Questionnaire    Feeling of Stress : Only a little  Social Connections: Moderately Isolated (02/17/2023)   Social Connection and Isolation Panel [NHANES]    Frequency of Communication with Friends and Family: More than three times a week    Frequency of Social Gatherings with Friends and Family: More than three times a week    Attends Religious Services: 1 to 4 times per year    Active Member of [employer] or Organizations: No    Attends Banker Meetings: Not on file    Marital Status: Widowed  Intimate Partner Violence: Not on file      Levert Feinstein, M.D. Ph.D.  [facility] [address] [place], [place] [address] Ph: [phone] Fax: [phone]  CC:  Anders Simmonds, PA-C [address] [place],  [place] [address]  Hoy Register, MD

## 2023-02-28 NOTE — Progress Notes (Signed)
xenomin 50units x 1 vial  [id number] Exp-07.2026 B/B Bacteriostatic 0.9% Sodium Chloride- 1mL  [id number] Expiration: 11.01.2025 NDC: [id number] Dx: G51.39 WITNESSED WU:[id number], CMA

## 2023-02-28 NOTE — Telephone Encounter (Signed)
Dr. Terrace Arabia requested to inject pt at consult today. I called [employer] MCD (could not check through portal) and was told that PA not needed for codes [address], [id number], or [phone]. Ref # for call is [id number]. I took pod 50u of Xeomin for the pt.

## 2023-02-28 NOTE — Patient Instructions (Addendum)
Medication Instructions:  Your physician has recommended you make the following change in your medication:  1) RESTART Entresto at 24-26 mg twice daily 2) STOP taking spironolactone  2) START taking Multaq 400 mg twice daily  *If you need a refill on your cardiac medications before your next appointment, please call your pharmacy*  Follow-Up: At [facility], you and your health needs are our priority.  As part of our continuing mission to provide you with exceptional heart care, we have created designated Provider Care Teams.  These Care Teams include your primary Cardiologist (physician) and Advanced Practice Providers (APPs -  Physician Assistants and Nurse Practitioners) who all work together to provide you with the care you need, when you need it.  Your next appointment:   Next available with Dr. Lalla Brothers to discuss an ablation

## 2023-03-01 ENCOUNTER — Telehealth: Payer: Self-pay | Admitting: Internal Medicine

## 2023-03-01 ENCOUNTER — Other Ambulatory Visit: Payer: Self-pay

## 2023-03-01 MED ORDER — DRONEDARONE HCL 400 MG PO TABS: 400.0000 mg | ORAL_TABLET | Freq: Two times a day (BID) | ORAL | 3 refills | Status: DC

## 2023-03-01 NOTE — Telephone Encounter (Signed)
Patient called in stating that she called her pharmacy yesterday and they still had not received the Multaq- they did receive the Entresto. I advised that I would re-send the RX to the pharmacy.  Patient verbalized understanding. Thankful for call back.

## 2023-03-01 NOTE — Telephone Encounter (Signed)
Pt c/o medication issue:  1. Name of Medication:  dronedarone (MULTAQ) 400 MG tablet     2. How are you currently taking this medication (dosage and times per day)?  Take 1 tablet (400 mg total) by mouth 2 (two) times daily with a meal.       3. Are you having a reaction (difficulty breathing--STAT)? No  4. What is your medication issue? Pt is requesting a callback regarding her appt yesterday and this discussion regarding this medication. Please advise

## 2023-03-01 NOTE — Telephone Encounter (Signed)
Patient called back stating that we needed approval from insurance for the Multaq- I will send over to PA team to see if this is what was needed. Thanks!

## 2023-03-03 ENCOUNTER — Other Ambulatory Visit (HOSPITAL_COMMUNITY): Payer: Self-pay

## 2023-03-03 ENCOUNTER — Telehealth: Payer: Self-pay | Admitting: Pharmacy Technician

## 2023-03-03 NOTE — Telephone Encounter (Signed)
Pharmacy Patient Advocate Encounter   Received notification from Pt Calls Messages that prior authorization for Multaq 400MG  tablets is required/requested.   Insurance verification completed.   The patient is insured through [facility] .   Per test claim:   PA submitted to [facility] via CoverMyMeds Key/confirmation #/EOC [id number] Status is pending

## 2023-03-03 NOTE — Telephone Encounter (Signed)
PA request has been Submitted. New Encounter created for follow up. For additional info see Pharmacy Prior Auth telephone encounter from 03/03/2023.

## 2023-03-06 NOTE — Telephone Encounter (Signed)
Pharmacy Patient Advocate Encounter  Received notification from [facility] that Prior Authorization for MULTAQ 400  has been APPROVED from 7.12.24 to 7.12.25.[name]  PA #/Case ID/Reference #: Jasper [id number]

## 2023-03-07 NOTE — Telephone Encounter (Signed)
Called patient, advised the medication was approved, she is able to pick up from pharmacy.  Patient verbalized understanding.

## 2023-03-10 ENCOUNTER — Telehealth: Payer: Self-pay | Admitting: Internal Medicine

## 2023-03-10 NOTE — Telephone Encounter (Signed)
Spoke with the pt who is states she is concerned after reading pt information on Multaq.  Pt advised all medications carry risks and side effects but Dr Graciela Husbands feels the medication is necessary to try at this time due to her atrial tachycardia and intolerance of other anti-arrhythmic drugs.  Pt verbalizes understanding and agrees to proceed with medication trial starting tomorrow.  Pt advised to contact office with any further questions or concerns.  She thanked Charity fundraiser for the callback.

## 2023-03-10 NOTE — Telephone Encounter (Signed)
Pt called in asking to speak with nurse about this medication -   dronedarone (MULTAQ) 400 MG tablet

## 2023-03-20 LAB — CUP PACEART REMOTE DEVICE CHECK
Battery Remaining Longevity: 80 mo
Battery Remaining Percentage: 67 %
Battery Voltage: 3.01 V
Brady Statistic AP VP Percent: 1 %
Brady Statistic AP VS Percent: 11 %
Brady Statistic AS VP Percent: 1 %
Brady Statistic AS VS Percent: 88 %
Brady Statistic RA Percent Paced: 9.9 %
Brady Statistic RV Percent Paced: 1.4 %
Date Time Interrogation Session: 20240729033929
Implantable Lead Connection Status: 753985
Implantable Lead Connection Status: 753985
Implantable Lead Implant Date: 20201103
Implantable Lead Implant Date: 20201103
Implantable Lead Location: 753859
Implantable Lead Location: 753860
Implantable Pulse Generator Implant Date: 20201103
Lead Channel Impedance Value: 430 Ohm
Lead Channel Impedance Value: 660 Ohm
Lead Channel Pacing Threshold Amplitude: 0.75 V
Lead Channel Pacing Threshold Amplitude: 1 V
Lead Channel Pacing Threshold Pulse Width: 0.5 ms
Lead Channel Pacing Threshold Pulse Width: 0.5 ms
Lead Channel Sensing Intrinsic Amplitude: 12 mV
Lead Channel Sensing Intrinsic Amplitude: 5 mV
Lead Channel Setting Pacing Amplitude: 1.5 V
Lead Channel Setting Pacing Amplitude: 2.5 V
Lead Channel Setting Pacing Pulse Width: 0.5 ms
Lead Channel Setting Sensing Sensitivity: 2 mV
Pulse Gen Model: 2272
Pulse Gen Serial Number: 9174760

## 2023-03-21 ENCOUNTER — Ambulatory Visit: Payer: Disability Insurance

## 2023-03-21 ENCOUNTER — Encounter: Payer: Self-pay | Admitting: Internal Medicine

## 2023-03-21 ENCOUNTER — Ambulatory Visit (INDEPENDENT_AMBULATORY_CARE_PROVIDER_SITE_OTHER): Payer: Medicaid Other

## 2023-03-21 DIAGNOSIS — I480 Paroxysmal atrial fibrillation: Secondary | ICD-10-CM

## 2023-03-27 ENCOUNTER — Telehealth: Payer: Self-pay

## 2023-03-27 NOTE — Telephone Encounter (Signed)
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  Rate 147bpm.   Spoke with patient.  Reviewed A. Tillery, PA-C recommendations.  Patient agrees to take Multaq and will start today.  She has ER precautions and will keep appt next week with Dr. Lalla Brothers regarding ablation.

## 2023-03-27 NOTE — Telephone Encounter (Signed)
Reviewed with A. Tillery PA-C. Patient really needs to start on her Multaq.  Will attempt to reassure her and get her to start.  If refuses, will increase her Metoprolol (adding 50mg  to the opposite time of day she is taking the 100mg  dose).    Keep follow up with Lalla Brothers on 04/06/2023.

## 2023-03-27 NOTE — Telephone Encounter (Signed)
Alert remote transmission: Exceeded AT/AF Burden. Presenting Rhythm: AS/VS at 141 bpm. 683 AMS episodes, longest 13 hrs 13 min, AT/AF burden 36%. EGMs show primarily 1:1 atrial driven tachycardia, V rates are right shifted. Routed to clinic for rate review. Follow up as scheduled. [facility], CV[facility].   Spoke with patient.  She denies any symptoms.  Will send [place] a manual transmission for updated rate/rhythm when she gets home later today.   She still has not started her Multaq.  Even though she received reassurance by Klein/Marsha to go ahead on 7/19, she still is concerned with warnings she reads for patients who have (devices, cva history) .  Dr. Graciela Husbands is out of the office, will review with A. Tillery, PA-C for next steps.   See's Dr. Lalla Brothers on 8/15 to discuss ablation. Last Saw Dr. Graciela Husbands on 02/28/2023 when ordered to start the Multaq:  Per 02/28/23 DR. KLEIN OV NOTE:

## 2023-03-28 NOTE — Telephone Encounter (Signed)
Do we need to leave this in the device basket for f/u? Or is it ok to take out?

## 2023-03-28 NOTE — Telephone Encounter (Signed)
Okay to remove, has been addressed and patient to see Dr. Lalla Brothers next week and is aware.  Thanks.

## 2023-03-31 ENCOUNTER — Ambulatory Visit: Payer: Medicaid Other | Attending: Physician Assistant | Admitting: Physician Assistant

## 2023-03-31 ENCOUNTER — Encounter: Payer: Self-pay | Admitting: Physician Assistant

## 2023-03-31 VITALS — BP 100/60 | HR 119 | Ht 68.5 in | Wt 188.0 lb

## 2023-03-31 DIAGNOSIS — I5042 Chronic combined systolic (congestive) and diastolic (congestive) heart failure: Secondary | ICD-10-CM

## 2023-03-31 DIAGNOSIS — E785 Hyperlipidemia, unspecified: Secondary | ICD-10-CM | POA: Diagnosis not present

## 2023-03-31 DIAGNOSIS — Z951 Presence of aortocoronary bypass graft: Secondary | ICD-10-CM | POA: Diagnosis not present

## 2023-03-31 DIAGNOSIS — Z950 Presence of cardiac pacemaker: Secondary | ICD-10-CM | POA: Diagnosis not present

## 2023-03-31 DIAGNOSIS — I700 Atherosclerosis of aorta: Secondary | ICD-10-CM

## 2023-03-31 DIAGNOSIS — I480 Paroxysmal atrial fibrillation: Secondary | ICD-10-CM

## 2023-03-31 DIAGNOSIS — I4719 Other supraventricular tachycardia: Secondary | ICD-10-CM

## 2023-03-31 DIAGNOSIS — I2510 Atherosclerotic heart disease of native coronary artery without angina pectoris: Secondary | ICD-10-CM

## 2023-03-31 DIAGNOSIS — I10 Essential (primary) hypertension: Secondary | ICD-10-CM

## 2023-03-31 NOTE — Progress Notes (Signed)
Cardiology Office Note:  .   Date:  03/31/2023  ID:  Ashley [name], DOB 1966-03-11, MRN [id number] PCP: Hoy Register, MD  [facility] Providers Cardiologist:  Dietrich Pates, MD Electrophysiologist:  Sherryl Manges, MD {  History of Present Illness: .   Ashley [name] is a 57 y.o. female with past medical history of atrial tachycardia, chronic combined systolic and diastolic CHF, CVA 2017 treated with tPA , tobacco use, history of CABG, PAF, PPM implantation 11/20 [facility]) HLD with LDL goal less than 70, HTN, and aortic atherosclerosis here for 49-month follow-up visit.   Biggest issue today has been her atrial tachycardia.  She was started on Multaq on Monday and does not helped control her rate.  Heart rate 119 today.  Blood pressure 100/60.  Luckily, asymptomatic at this time without any shortness of breath, lightheadedness, or dizziness.  No syncope or presyncope.  However, has had this elevated heart rate for at least over a month.  When she was seen by Dr. Raechel Ache her heart rate was 141, blood pressure 90/68.  She tells me that she has been on numerous medications and has not tolerated them.  Amiodarone was not well-tolerated due to thyroid issues.  She is not in acute heart failure but hypotensive.  She is euvolemic on exam.  She has been taking her Lasix 40 mg twice a day.  She states she has had multiple cardioversions and the last one "almost killed her".  She is compliant with her medications and she remains on 100 mg daily of metoprolol succinate in addition to her Multaq for rate control which has been unsuccessful.  Recently started on Entresto by Dr. Graciela Husbands.  Son is very concerned.  She has been compliant with her apixaban.  She also has a history of sinus node dysfunction and ended up with a pacemaker implantation 11/20.  She has twins, son and a daughter both with lots of personalities.  Reports no shortness of breath nor dyspnea on exertion. Reports no chest  pain, pressure, or tightness. No edema, orthopnea, PND. Reports no palpitations.   ROS: Pertinent ROS in HPI  Studies Reviewed: .       Echocardiogram 05/18/2022 IMPRESSIONS     1. Left ventricular ejection fraction, by estimation, is 40%. The left  ventricle has moderately decreased function. The left ventricle  demonstrates global hypokinesis. Left ventricular diastolic parameters are  indeterminate.   2. Right ventricular systolic function is hyperdynamic. The right  ventricular size is normal. There is normal pulmonary artery systolic  pressure.   3. Left atrial size was moderately dilated.   4. The mitral valve has been repaired/replaced. Mild to moderate mitral  valve regurgitation. No evidence of mitral stenosis. The mean mitral valve  gradient is 4.0 mmHg. Moderate mitral annular calcification.   5. The aortic valve is normal in structure. Aortic valve regurgitation is  not visualized. Aortic valve sclerosis/calcification is present, without  any evidence of aortic stenosis.   6. The inferior vena cava is normal in size with greater than 50%  respiratory variability, suggesting right atrial pressure of 3 mmHg.   FINDINGS   Left Ventricle: Left ventricular ejection fraction, by estimation, is  40%. The left ventricle has moderately decreased function. The left  ventricle demonstrates global hypokinesis. The left ventricular internal  cavity size was normal in size. There is  no left ventricular hypertrophy. Left ventricular diastolic parameters are  indeterminate.   Right Ventricle: The right ventricular size is normal.  No increase in  right ventricular wall thickness. Right ventricular systolic function is  hyperdynamic. There is normal pulmonary artery systolic pressure. The  tricuspid regurgitant velocity is 2.19  m/s, and with an assumed right atrial pressure of 3 mmHg, the estimated  right ventricular systolic pressure is 22.2 mmHg.   Left Atrium: Left atrial size  was moderately dilated.   Right Atrium: Right atrial size was normal in size.   Pericardium: There is no evidence of pericardial effusion.   Mitral Valve: The mitral valve has been repaired/replaced. Moderate mitral  annular calcification. Mild to moderate mitral valve regurgitation. There  is a bioprosthetic valve present in the mitral position. No evidence of  mitral valve stenosis. MV peak  gradient, 13.2 mmHg. The mean mitral valve gradient is 4.0 mmHg.   Tricuspid Valve: The tricuspid valve is normal in structure. Tricuspid  valve regurgitation is mild . No evidence of tricuspid stenosis.   Aortic Valve: The aortic valve is normal in structure. Aortic valve  regurgitation is not visualized. Aortic valve sclerosis/calcification is  present, without any evidence of aortic stenosis.   Pulmonic Valve: The pulmonic valve was normal in structure. Pulmonic valve  regurgitation is trivial. No evidence of pulmonic stenosis.   Aorta: The aortic root is normal in size and structure.   Venous: The inferior vena cava is normal in size with greater than 50%  respiratory variability, suggesting right atrial pressure of 3 mmHg.   IAS/Shunts: No atrial level shunt detected by color flow Doppler.   Additional Comments: A device lead is visualized.   Risk Assessment/Calculations:    CHA2DS2-VASc Score = 6   This indicates a 9.7% annual risk of stroke. The patient's score is based upon: CHF History: 1 HTN History: 1 Diabetes History: 0 Stroke History: 2 Vascular Disease History: 1 Age Score: 0 Gender Score: 1            Physical Exam:   VS:  BP 100/60   Pulse (!) 119   Ht 5' 8.5" (1.74 m)   Wt 188 lb (85.3 kg)   LMP 05/18/2015 Comment: went for 3 week after skipping two months   SpO2 97%   BMI 28.17 kg/m    Wt Readings from Last 3 Encounters:  03/31/23 188 lb (85.3 kg)  02/28/23 183 lb 8 oz (83.2 kg)  02/28/23 183 lb (83 kg)    GEN: Well nourished, well developed in no  acute distress NECK: No JVD; No carotid bruits CARDIAC: RRR, no murmurs, rubs, gallops RESPIRATORY:  Clear to auscultation without rales, wheezing or rhonchi  ABDOMEN: Soft, non-tender, non-distended EXTREMITIES:  No edema; No deformity   ASSESSMENT AND PLAN: .   Atrial tachycardia/atrial fibrillation -Currently on Multaq 400 mg twice a day (started Monday 8/5), metoprolol 100 mg daily, heart rate still 120 bpm -Many medications have been tried for years and have been unsuccessful -She has been 3 DCCV is all unsuccessful -We moved her appointment with Dr. Lalla Brothers up to Tuesday in order to discuss ablation and get her scheduled ASAP -Luckily, she is asymptomatic at this time otherwise I would be admitting her  Nonischemic cardiomyopathy -Seen by Dr. Gala Romney in the past and echocardiogram from the fall showed LVEF of 40% (as low as 30% in the setting of AFL with RVR back in 2020) Recommend continuing GDMT: Farxiga 10 mg daily, Eliquis 5 mg twice a day, Lasix 40 mg (taking it twice daily), Entresto 24-26 mg daily, metoprolol succinate 100 mg daily, on Multaq  400 mg twice a day -Euvolemic on exam  Hypertension -Hypotension in the setting of atrial tachycardia -Unfortunately no room to increase any of her medications today -Would continue to keep track of her blood pressure and heart rate at home -Monitor fluid status closely  CAD status post CABG   -No chest pain -All of her issues currently are rhythm related -continue current medications   Dispo: ASAP follow-up with Dr. Lalla Brothers for ablation -Follow-up with Dr. Lynnette Caffey in 6 months  Signed, Sharlene Dory, PA-C

## 2023-03-31 NOTE — Patient Instructions (Signed)
Medication Instructions:   Your physician recommends that you continue on your current medications as directed. Please refer to the Current Medication list given to you today.   *If you need a refill on your cardiac medications before your next appointment, please call your pharmacy*   Lab Work:  NONE ORDERED  TODAY    If you have labs (blood work) drawn today and your tests are completely normal, you will receive your results only by: MyChart Message (if you have MyChart) OR A paper copy in the mail If you have any lab test that is abnormal or we need to change your treatment, we will call you to review the results.   Testing/Procedures: NONE ORDERED  TODAY     Follow-Up: At [facility], you and your health needs are our priority.  As part of our continuing mission to provide you with exceptional heart care, we have created designated Provider Care Teams.  These Care Teams include your primary Cardiologist (physician) and Advanced Practice Providers (APPs -  Physician Assistants and Nurse Practitioners) who all work together to provide you with the care you need, when you need it.  We recommend signing up for the patient portal called "MyChart".  Sign up information is provided on this After Visit Summary.  MyChart is used to connect with patients for Virtual Visits (Telemedicine).  Patients are able to view lab/test results, encounter notes, upcoming appointments, etc.  Non-urgent messages can be sent to your provider as well.   To learn more about what you can do with MyChart, go to [email or web address].    Your next appointment:  EP AS SCHEDULED   6 month(s)  Provider:    Lynnette Caffey    Other Instructions

## 2023-04-03 ENCOUNTER — Ambulatory Visit
Admission: RE | Admit: 2023-04-03 | Discharge: 2023-04-03 | Disposition: A | Discharge status: Home / Self Care | Payer: Medicaid Other | Source: Ambulatory Visit | Attending: Family Medicine | Admitting: Family Medicine

## 2023-04-03 DIAGNOSIS — Z1231 Encounter for screening mammogram for malignant neoplasm of breast: Secondary | ICD-10-CM

## 2023-04-04 ENCOUNTER — Encounter: Payer: Self-pay | Admitting: Cardiology

## 2023-04-04 ENCOUNTER — Ambulatory Visit: Payer: Medicaid Other | Attending: Cardiology | Admitting: Cardiology

## 2023-04-04 VITALS — BP 94/62 | HR 135 | Ht 68.5 in | Wt 188.0 lb

## 2023-04-04 DIAGNOSIS — I480 Paroxysmal atrial fibrillation: Secondary | ICD-10-CM | POA: Diagnosis not present

## 2023-04-04 DIAGNOSIS — I5022 Chronic systolic (congestive) heart failure: Secondary | ICD-10-CM

## 2023-04-04 DIAGNOSIS — Z950 Presence of cardiac pacemaker: Secondary | ICD-10-CM

## 2023-04-04 NOTE — Patient Instructions (Signed)
Medication Instructions:  Your physician recommends that you continue on your current medications as directed. Please refer to the Current Medication list given to you today.  *If you need a refill on your cardiac medications before your next appointment, please call your pharmacy*  Lab Work: BMET and CBC prior to CT scan and ablation.  Testing/Procedures: Your physician has requested that you have cardiac CT. Cardiac computed tomography (CT) is a painless test that uses an x-ray machine to take clear, detailed pictures of your heart. For further information please visit [email or web address] We will call you to schedule your CT scan. It will be done about one week prior to your ablation.  Your physician has recommended that you have an ablation. Catheter ablation is a medical procedure used to treat some cardiac arrhythmias (irregular heartbeats). During catheter ablation, a long, thin, flexible tube is put into a blood vessel in your groin (upper thigh), or neck. This tube is called an ablation catheter. It is then guided to your heart through the blood vessel. Radio frequency waves destroy small areas of heart tissue where abnormal heartbeats may cause an arrhythmia to start. You will be scheduled for your ablation in January 2025. We will call you once Dr. Lovena Neighbours schedule opens up to give you your procedure date.   Follow-Up: At [facility], you and your health needs are our priority.  As part of our continuing mission to provide you with exceptional heart care, we have created designated Provider Care Teams.  These Care Teams include your primary Cardiologist (physician) and Advanced Practice Providers (APPs -  Physician Assistants and Nurse Practitioners) who all work together to provide you with the care you need, when you need it.  Your next appointment:   We will call you to schedule your follow up appointments.

## 2023-04-04 NOTE — Progress Notes (Signed)
Electrophysiology Office Note:    Date:  04/04/2023   ID:  Ashley [name], DOB 10-20-1965, MRN [id number]  [facility] Cardiologist:  Dietrich Pates, MD  [facility] Electrophysiologist:  Sherryl Manges, MD   Referring MD: Hoy Register, MD   Chief Complaint: Atrial tachycardia  History of Present Illness:    Ashley [name] is a 57 y.o. femalewho I am seeing today for an evaluation of atrial tachycardia at the request of Dr. Graciela Husbands.  The patient was last seen by Dr. Graciela Husbands on February 28, 2019 for.  The patient has a medical history that includes atrial fibrillation/flutter, sinus node dysfunction, permanent pacemaker implanted in 2020, coronary artery disease post CABG, mitral valve prolapse post annuloplasty and frequent PVCs she was previously on Tikosyn for her arrhythmias but this had to be stopped due to QT prolongation.  She was started on amiodarone.  Uses Eliquis for stroke prophylaxis.  Does have a history of prior stroke treated with tPA in 2017.  Her amiodarone had to be stopped due to hyperthyroidism in July 2023.  At her most recent appointment she was started on Multaq.  Catheter ablation was discussed and she wished to proceed if a candidate.   She is short of breath and fatigued while out of rhythm.  She does not appreciate the palpitations as much as the general malaise and shortness of breath.  Her son can tell when she is out of rhythm.  He is with her today in clinic.         Their past medical, social and family history was reveiwed.   ROS:   Please see the history of present illness.    All other systems reviewed and are negative.  EKGs/Labs/Other Studies Reviewed:    The following studies were reviewed today:  April 04, 2023 in clinic device interrogation personally reviewed Battery longevity 6.8 years Atrial pacing 6.3% Ventricular pacing 1.4% Paroxysmal AT with atrial rates in the 130s to 150s.  Episodes lasting as long as 7 hours.   Atrial tachycardia today during the appointment converted to sinus rhythm.  Her burden has been increasing since July  March 20, 2023 device interrogation showed a 21% AT/a flutter burden.  Heart rates greater than 100 bpm  May 18, 2022 echo EF 40% Global hypokinesis Hyperdynamic right ventricle Moderately dilated left atrium Mild to moderate MR  February 28, 2023 EKG shows likely atrial tachycardia with short PR.  Incomplete right bundle-branch block   EKG Interpretation Date/Time:  Tuesday April 04 2023 10:09:18 EDT Ventricular Rate:  136 PR Interval:  190 QRS Duration:  122 QT Interval:  372 QTC Calculation: 559 R Axis:   92  Text Interpretation: Atrial tachycardia Right bundle branch block Confirmed by Steffanie Dunn [phone]) on 04/04/2023 10:20:49 AM    Physical Exam:    VS:  BP 94/62 (BP Location: Left Arm, Patient Position: Sitting, Cuff Size: Normal)   Pulse (!) 135   Ht 5' 8.5" (1.74 m)   Wt 188 lb (85.3 kg)   LMP 05/18/2015 Comment: went for 3 week after skipping two months   BMI 28.17 kg/m     Wt Readings from Last 3 Encounters:  04/04/23 188 lb (85.3 kg)  03/31/23 188 lb (85.3 kg)  02/28/23 183 lb 8 oz (83.2 kg)     GEN:  Well nourished, well developed in no acute distress CARDIAC: RRR, no murmurs, rubs, gallops RESPIRATORY:  Clear to auscultation without rales, wheezing or rhonchi   During the visit  today she converted from atrial tachycardia to sinus rhythm.    ASSESSMENT AND PLAN:    1. Chronic systolic heart failure (HCC)   2. Pacemaker   3. PAF (paroxysmal atrial fibrillation) (HCC)     #Paroxysmal atrial fibrillation/flutter/atrial tachycardia Failed prior treatment with Tikosyn and amiodarone.  Is associated with a reduced ejection fraction.  Rhythm control indicated.  I discussed treatment options for her arrhythmias including catheter ablation and she wishes to proceed with scheduling.  Her ablation lesion set would consist of a PVI  plus CTI +/- posterior wall with mapping/ablation of any other inducible flutters/atrial tachycardias.  Discussed treatment options today for AF including antiarrhythmic drug therapy and ablation. Discussed risks, recovery and likelihood of success with each treatment strategy. Risk, benefits, and alternatives to EP study and ablation for afib were discussed. These risks include but are not limited to stroke, bleeding, vascular damage, tamponade, perforation, damage to the esophagus, lungs, phrenic nerve and other structures, pulmonary vein stenosis, worsening renal function, coronary vasospasm and death.  Discussed potential need for repeat ablation procedures and antiarrhythmic drugs after an initial ablation. The patient understands these risk and wishes to proceed.  We will therefore proceed with catheter ablation at the next available time.  Carto, ICE, anesthesia are requested for the procedure.  Will also obtain CT PV protocol prior to the procedure to exclude LAA thrombus and further evaluate atrial anatomy.  Continue Multaq.  #Chronic systolic heart failure NYHA class II-III.  Warm and dry on exam today.  EF 40% on recent echo.  Continue Farxiga, Lasix, metoprolol, Entresto.  #Permanent pacemaker in situ Device functioning appropriately, continue remote monitoring     Signed, Sheria Lang T. Lalla Brothers, MD, [facility], [facility] 04/04/2023 10:45 AM    Electrophysiology [facility]

## 2023-04-04 NOTE — Addendum Note (Signed)
Addended by: Frutoso Schatz on: 04/04/2023 10:47 AM   Modules accepted: Orders

## 2023-04-06 ENCOUNTER — Encounter: Payer: Disability Insurance | Admitting: Cardiology

## 2023-04-07 ENCOUNTER — Encounter: Payer: Self-pay | Admitting: Cardiology

## 2023-04-11 ENCOUNTER — Other Ambulatory Visit: Payer: Self-pay | Admitting: Internal Medicine

## 2023-04-12 NOTE — Progress Notes (Signed)
Remote pacemaker transmission.   

## 2023-05-02 ENCOUNTER — Other Ambulatory Visit (HOSPITAL_COMMUNITY): Payer: Self-pay | Admitting: Physician Assistant

## 2023-06-05 ENCOUNTER — Ambulatory Visit: Payer: Medicaid Other | Admitting: Neurology

## 2023-06-05 ENCOUNTER — Encounter: Payer: Self-pay | Admitting: Neurology

## 2023-06-05 VITALS — BP 134/84 | HR 97 | Ht 68.6 in | Wt 186.0 lb

## 2023-06-05 DIAGNOSIS — G5131 Clonic hemifacial spasm, right: Secondary | ICD-10-CM

## 2023-06-05 MED ORDER — INCOBOTULINUMTOXINA 50 UNITS IM SOLR
50.0000 [IU] | INTRAMUSCULAR | Status: AC
Start: 2023-06-05 — End: ?
  Administered 2023-06-05: 50 [IU] via INTRAMUSCULAR

## 2023-06-05 NOTE — Progress Notes (Signed)
xeomin 50units x 1 vial  [id number] [phone] Exp-07.2026 B/B Bacteriostatic 0.9% Sodium Chloride- 1mL  FAO:[id number] Expiration: 04.01.2025 NDC: [id number] Dx: G51.39 WITNESSED WU:XLKGM, RN

## 2023-06-05 NOTE — Progress Notes (Signed)
Chief Complaint  Patient presents with   Follow-up    Rm 14. Alone. Hemifacial spasm of right side. She says facial spasms had improved until 2 weeks ago, then they returned though they are less severe.      ASSESSMENT AND PLAN  Ashley [name] is a 57 y.o. female   Right hemifacial spasm 50 units xeomin is dissolved into 1cc NS.  Under EMG guidance 2.5 units at each injection site    DIAGNOSTIC DATA (LABS, IMAGING, TESTING) - I reviewed patient records, labs, notes, testing and imaging myself where available.   MEDICAL HISTORY:  Ashley [name] is a 57 years old female, seen in request by her primary physician Dr. Alvis Lemmings, Odette Horns for evaluation of right hemifacial spasm, botulinum toxin injection.   I reviewed and summarized the referring note. PMHX. Stroke Right hemifacial spasm Atrial fibrillation HTN CHF CAD Cardiomyopathy, moderate MR, s/p MV repair Smoke Pacemaker placement   She has history of stroke, multiple cardiac condition, atrial fibrillation, taking Eliquis  She started to have intermittent right hemifacial spasm since 2013, gradually getting worse, I saw her in 2017, had low dose xeomin injection, which was helpful,  lost follow up since last injection in Dec 2018 due to insurance reasons.   Now she has frequent right facial spasm, hope to receive botulinum toxin injection again, understanding potential risk of injection,    UPDATE Jun 05 2023: She responded well to this injection no significant side effect noted, at the end of the 3 months, she noticed mild recurrence of right facial twitching  PHYSICAL EXAM:   Vitals:   06/05/23 1015  BP: 134/84  Pulse: 97  Weight: 186 lb (84.4 kg)  Height: 5' 8.6" (1.742 m)     Body mass index is 27.79 kg/m.  PHYSICAL EXAMNIATION:  Gen: NAD, conversant, well nourised, well groomed                     Cardiovascular: Regular rate rhythm, no peripheral edema, warm, nontender. Eyes:  Conjunctivae clear without exudates or hemorrhage Neck: Supple, no carotid bruits. Pulmonary: Clear to auscultation bilaterally   NEUROLOGICAL EXAM:  MENTAL STATUS: Speech/cognition: Awake, alert, oriented to history taking and casual conversation CRANIAL NERVES: CN II: Visual fields are full to confrontation. Pupils are round equal and briskly reactive to light. CN III, IV, VI: extraocular movement are normal. No ptosis. CN V: Facial sensation is intact to light touch CN VII: Face is symmetric with normal eye closure, frequent right hemifacial spasm, mainly involving right orbicularis oculi, some right frontalis muscles CN VIII: Hearing is normal to causal conversation. CN IX, X: Phonation is normal. CN XI: Head turning and shoulder shrug are intact  MOTOR: There is no pronator drift of out-stretched arms. Muscle bulk and tone are normal. Muscle strength is normal.  REFLEXES: Reflexes are 2+ and symmetric at the biceps, triceps, knees, and ankles. Plantar responses are flexor.  SENSORY: Intact to light touch, pinprick and vibratory sensation are intact in fingers and toes.  COORDINATION: There is no trunk or limb dysmetria noted.  GAIT/STANCE: Posture is normal. Gait is steady    REVIEW OF SYSTEMS:  Full 14 system review of systems performed and notable only for as above All other review of systems were negative.   ALLERGIES: Allergies  Allergen Reactions   Azithromycin Nausea Only   Latex Rash   Spinach Hives    Cooked    Tape Dermatitis and Other (See Comments)  PLEASE USE PAPER TAPE!! AFFECTS SKIN BADLY!!!!    HOME MEDICATIONS: Current Outpatient Medications  Medication Sig Dispense Refill   acetaminophen (TYLENOL) 500 MG tablet Take 500-1,000 mg by mouth every 6 (six) hours as needed for headache.     apixaban (ELIQUIS) 5 MG TABS tablet TAKE ONE TABLET BY MOUTH TWICE A DAY 180 tablet 1   Ascorbic Acid (VITAMIN C) 1000 MG tablet Take 1,000 mg by mouth 2  (two) times daily.      atorvastatin (LIPITOR) 80 MG tablet TAKE ONE TABLET BY MOUTH ONE TIME DAILY 90 tablet 3   dapagliflozin propanediol (FARXIGA) 10 MG TABS tablet Take 1 tablet (10 mg total) by mouth daily before breakfast. 30 tablet 3   diclofenac Sodium (VOLTAREN ARTHRITIS PAIN) 1 % GEL Apply 2 g topically as needed.     diphenhydrAMINE (BENADRYL) 25 MG tablet Take 25 mg by mouth every 6 (six) hours as needed for allergies.     dronedarone (MULTAQ) 400 MG tablet Take 1 tablet (400 mg total) by mouth 2 (two) times daily with a meal. 180 tablet 3   ferrous sulfate 325 (65 FE) MG tablet Take 1 tablet (325 mg total) by mouth daily with breakfast. 30 tablet 3   furosemide (LASIX) 40 MG tablet TAKE ONE TABLET BY MOUTH ONE TIME DAILY, TAKE AN EXTRA TABLET AT NIGHT AS NEEDED 45 tablet 11   Homeopathic Products (ARNICARE ARNICA EX) Apply 1 application. topically as needed (bruises).     lidocaine (LIDODERM) 5 % Place 1 patch onto the skin as needed. Remove & Discard patch within 12 hours or as directed by MD     metoprolol succinate (TOPROL-XL) 100 MG 24 hr tablet Take 1 tablet (100 mg total) by mouth daily. Take with or immediately following a meal. 90 tablet 3   sacubitril-valsartan (ENTRESTO) 24-26 MG Take 1 tablet by mouth 2 (two) times daily. 90 tablet 3   sodium chloride (OCEAN) 0.65 % SOLN nasal spray Place 1 spray into both nostrils daily as needed (for congestion).     vitamin B-12 (CYANOCOBALAMIN) 1000 MCG tablet Take 1 tablet (1,000 mcg total) by mouth daily.     methocarbamol (ROBAXIN) 500 MG tablet Take 2 tablets (1,000 mg total) by mouth every 8 (eight) hours as needed for muscle spasms. (Patient not taking: Reported on 06/05/2023) 90 tablet 0   Current Facility-Administered Medications  Medication Dose Route Frequency Provider Last Rate Last Admin   incobotulinumtoxinA (XEOMIN) 50 units injection 50 Units  50 Units Intramuscular Q90 days Levert Feinstein, MD   50 Units at 11/30/16 1546    incobotulinumtoxinA (XEOMIN) 50 units injection 50 Units  50 Units Intramuscular Q90 days Levert Feinstein, MD   50 Units at 07/25/17 0843   incobotulinumtoxinA (XEOMIN) 50 units injection 50 Units  50 Units Intramuscular Q90 days Levert Feinstein, MD   50 Units at 07/26/17 1217    PAST MEDICAL HISTORY: Past Medical History:  Diagnosis Date   Abdominal pain    ? chronic cholecystitis; adm[facility]n to [facility] 9/12 (CT, USN, HIDA done)   Atrial fibrillation (HCC)    Cardiomyopathy    echo 9/12: mild LVH, EF 35-40%, mod MR (difficult to judge /post leaflet restricted), mod BAE, mod RVE, mod TR    Cardiomyopathy (HCC)    a. echo 9/12: mild LVH, EF 35-40%, mod MR (difficult to judge /post leaflet restricted), mod BAE, mod RVE, mod TR. b. Echo 11/2011: mild LVH, EF 55-60%, s/p MV repair w/o sig MR/MS, mildly  dilated RV/RA, PASP . // AFL dx 10/2018 - Echo 10/2018: EF 30, diff HK, s/p MV repair with trivial MR, mean MV 7 but not c/w significant mitral stenosis, mild RAE, severe LAE, mod reduced RVSF    Carotid artery disease (HCC)    a. Duplex 06/2015: 50% RECA, 1-39% BICA.   CHF (congestive heart failure) (HCC)    Clotting disorder (HCC)    Coronary artery disease    a. s/p CABG 10/2011 - LIMA-LAD, SVG-PDA, SVG-OM, SVG-diagonal.   Dyslipidemia    Essential hypertension    Heart murmur    Hemifacial spasm    History of Doppler ultrasound    a. Carotid US [facility] in [place], [place]) 7/17: no hemodynamically significant ICA stenosis   Hyperlipidemia    Mitral regurgitation    a. s/p MV repair 10/2011 - on Coumadin for 3 months afterwards then d/c'd by surgery.   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 age 76 - in setting of cigs and OCPs. //  b. s/p acute L parietal CVA >> tPA - [facility] in [place], [place]   Stroke [facility]) 01/2017   Tobacco abuse     PAST SURGICAL HISTORY: Past Surgical History:   Procedure Laterality Date   CARDIAC CATHETERIZATION     CARDIOVERSION N/A 01/25/2019   Procedure: CARDIOVERSION;  Surgeon: Chrystie Nose, MD;  Location: [facility] ENDOSCOPY;  Service: Cardiovascular;  Laterality: N/A;   CARDIOVERSION N/A 06/14/2019   Procedure: CARDIOVERSION;  Surgeon: Chrystie Nose, MD;  Location: [facility] ENDOSCOPY;  Service: Cardiovascular;  Laterality: N/A;   CESAREAN SECTION  1996   twins   CORONARY ARTERY BYPASS GRAFT  10/21/2011   Procedure: CORONARY ARTERY BYPASS GRAFTING (CABG);  Surgeon: Purcell Nails, MD;  Location: [facility] OR;  Service: Open Heart Surgery;  Laterality: N/A;  cabg x four, using right leg greater saphenous vein harvested endoscopically   DENTAL SURGERY     MITRAL VALVE REPAIR  10/21/2011   Procedure: MITRAL VALVE REPAIR (MVR);  Surgeon: Purcell Nails, MD;  Location: [facility] OR;  Service: Open Heart Surgery;  Laterality: N/A;   PACEMAKER IMPLANT N/A 06/25/2019   Procedure: PACEMAKER IMPLANT;  Surgeon: Hillis Range, MD;  Location: [facility] INVASIVE CV LAB;  Service: Cardiovascular;  Laterality: N/A;   TEE WITHOUT CARDIOVERSION  06/30/2011   Procedure: TRANSESOPHAGEAL ECHOCARDIOGRAM (TEE);  Surgeon: Pricilla Riffle, MD;  Location: [facility] ENDOSCOPY;  Service: Cardiovascular;  Laterality: N/A;   TEE WITHOUT CARDIOVERSION N/A 01/19/2022   Procedure: TRANSESOPHAGEAL ECHOCARDIOGRAM (TEE);  Surgeon: Sande Rives, MD;  Location: [facility] ENDOSCOPY;  Service: Cardiovascular;  Laterality: N/A;   TONSILLECTOMY     TUBAL LIGATION      FAMILY HISTORY: Family History  Problem Relation Age of Onset   Hypertension Mother    Stroke Mother    Heart disease Father    COPD Father    Bone cancer Maternal Grandmother    Anesthesia problems Neg Hx    Hypotension Neg Hx    Malignant hyperthermia Neg Hx    Pseudochol deficiency Neg Hx    Colon cancer Neg Hx     SOCIAL HISTORY: Social History   Socioeconomic History   Marital status: Widowed    Spouse name: Not on file   Number  of children: 2   Years of education: Not on file   Highest  education level: Bachelor's degree (e.g., BA, AB, BS)  Occupational History   Occupation: Payroll/Paperwork    Comment: Owns a Contractor.   Occupation: Disability    Comment: since march 2023  Tobacco Use   Smoking status: Former    Current packs/day: 0.00    Average packs/day: 1 pack/day for 30.0 years (30.0 ttl pk-yrs)    Types: Cigarettes    Start date: 10/1991    Quit date: 10/2021    Years since quitting: 1.6   Smokeless tobacco: Never  Vaping Use   Vaping status: Never Used  Substance and Sexual Activity   Alcohol use: Yes    Alcohol/week: 1.0 standard drink of alcohol    Types: 1 Cans of beer per week    Comment: can of beer 1 x week   Drug use: No   Sexual activity: Yes  Other Topics Concern   Not on file  Social History Narrative   Lives at home with husband and son.   Right-handed.   2 cups caffeine daily.   Social Determinants of Health   Financial Resource Strain: Not on file  Food Insecurity: No Food Insecurity (02/17/2023)   Hunger Vital Sign    Worried About Running Out of Food in the Last Year: Never true    Ran Out of Food in the Last Year: Never true  Transportation Needs: No Transportation Needs (02/17/2023)   PRAPARE - Administrator, Civil Service (Medical): No    Lack of Transportation (Non-Medical): No  Physical Activity: Insufficiently Active (02/17/2023)   Exercise Vital Sign    Days of Exercise per Week: 4 days    Minutes of Exercise per Session: 30 min  Stress: No Stress Concern Present (02/17/2023)   [employer] of Occupational Health - Occupational Stress Questionnaire    Feeling of Stress : Only a little  Social Connections: Moderately Isolated (02/17/2023)   Social Connection and Isolation Panel [NHANES]    Frequency of Communication with Friends and Family: More than three times a week    Frequency of Social Gatherings with Friends and Family:  More than three times a week    Attends Religious Services: 1 to 4 times per year    Active Member of [employer] or Organizations: No    Attends Banker Meetings: Not on file    Marital Status: Widowed  Intimate Partner Violence: Not on file      Levert Feinstein, M.D. Ph.D.  [facility] [address], [address] [place], [place] [address] Ph: [phone] Fax: [phone]  CC:  Hoy Register, MD [address] [place] [address] [place],  [place] [address]  Hoy Register, MD

## 2023-06-20 ENCOUNTER — Ambulatory Visit (INDEPENDENT_AMBULATORY_CARE_PROVIDER_SITE_OTHER): Payer: Self-pay

## 2023-06-20 DIAGNOSIS — I428 Other cardiomyopathies: Secondary | ICD-10-CM

## 2023-06-20 DIAGNOSIS — I495 Sick sinus syndrome: Secondary | ICD-10-CM

## 2023-06-21 LAB — CUP PACEART REMOTE DEVICE CHECK
Battery Remaining Longevity: 76 mo
Battery Remaining Percentage: 65 %
Battery Voltage: 3.01 V
Brady Statistic AP VP Percent: 1 %
Brady Statistic AP VS Percent: 32 %
Brady Statistic AS VP Percent: 1 %
Brady Statistic AS VS Percent: 68 %
Brady Statistic RA Percent Paced: 24 %
Brady Statistic RV Percent Paced: 1.1 %
Date Time Interrogation Session: 20241029042317
Implantable Lead Connection Status: 753985
Implantable Lead Connection Status: 753985
Implantable Lead Implant Date: 20201103
Implantable Lead Implant Date: 20201103
Implantable Lead Location: 753859
Implantable Lead Location: 753860
Implantable Pulse Generator Implant Date: 20201103
Lead Channel Impedance Value: 450 Ohm
Lead Channel Impedance Value: 590 Ohm
Lead Channel Pacing Threshold Amplitude: 0.75 V
Lead Channel Pacing Threshold Amplitude: 1.25 V
Lead Channel Pacing Threshold Pulse Width: 0.5 ms
Lead Channel Pacing Threshold Pulse Width: 0.5 ms
Lead Channel Sensing Intrinsic Amplitude: 12 mV
Lead Channel Sensing Intrinsic Amplitude: 5 mV
Lead Channel Setting Pacing Amplitude: 1.5 V
Lead Channel Setting Pacing Amplitude: 2.5 V
Lead Channel Setting Pacing Pulse Width: 0.5 ms
Lead Channel Setting Sensing Sensitivity: 2 mV
Pulse Gen Model: 2272
Pulse Gen Serial Number: 9174760

## 2023-07-07 ENCOUNTER — Telehealth: Payer: Self-pay

## 2023-07-07 NOTE — Telephone Encounter (Signed)
Called pt to go over Ablation/CT instructions and noticed her CT and lab appt's are the same day.   LM for her to call back to see if she can come sooner for her lab or move her CT out a day or 2.

## 2023-07-10 ENCOUNTER — Other Ambulatory Visit: Payer: Self-pay

## 2023-07-10 DIAGNOSIS — I5022 Chronic systolic (congestive) heart failure: Secondary | ICD-10-CM

## 2023-07-10 DIAGNOSIS — Z950 Presence of cardiac pacemaker: Secondary | ICD-10-CM

## 2023-07-10 DIAGNOSIS — I480 Paroxysmal atrial fibrillation: Secondary | ICD-10-CM

## 2023-07-11 NOTE — Progress Notes (Signed)
Remote pacemaker transmission.   

## 2023-07-19 LAB — CBC
Hematocrit: 40.7 % (ref 34.0–46.6)
Hemoglobin: 13.4 g/dL (ref 11.1–15.9)
MCH: 33.3 pg — ABNORMAL HIGH (ref 26.6–33.0)
MCHC: 32.9 g/dL (ref 31.5–35.7)
MCV: 101 fL — ABNORMAL HIGH (ref 79–97)
Platelets: 225 10*3/uL (ref 150–450)
RBC: 4.03 x10E6/uL (ref 3.77–5.28)
RDW: 12.9 % (ref 11.7–15.4)
WBC: 5.5 10*3/uL (ref 3.4–10.8)

## 2023-07-19 LAB — BASIC METABOLIC PANEL
BUN/Creatinine Ratio: 13 (ref 9–23)
BUN: 16 mg/dL (ref 6–24)
CO2: 24 mmol/L (ref 20–29)
Calcium: 9.1 mg/dL (ref 8.7–10.2)
Chloride: 104 mmol/L (ref 96–106)
Creatinine, Ser: 1.21 mg/dL — ABNORMAL HIGH (ref 0.57–1.00)
Glucose: 128 mg/dL — ABNORMAL HIGH (ref 70–99)
Potassium: 4.8 mmol/L (ref 3.5–5.2)
Sodium: 140 mmol/L (ref 134–144)
eGFR: 52 mL/min/{1.73_m2} — ABNORMAL LOW (ref >59)

## 2023-07-21 ENCOUNTER — Telehealth (HOSPITAL_COMMUNITY): Payer: Self-pay | Admitting: Emergency Medicine

## 2023-07-21 NOTE — Telephone Encounter (Signed)
Reaching out to patient to offer assistance regarding upcoming cardiac imaging study; pt verbalizes understanding of appt date/time, parking situation and where to check in, pre-test NPO status and medications ordered, and verified current allergies; name and call back number provided for further questions should they arise Cayne Yom RN Navigator Cardiac Imaging [facility] [phone] office [phone] cell 

## 2023-07-21 NOTE — Telephone Encounter (Signed)
Attempted to call patient regarding upcoming cardiac CT appointment. °Left message on voicemail with name and callback number °Dwan Fennel RN Navigator Cardiac Imaging °[facility] °[phone] Office °[phone] Cell ° °

## 2023-07-24 ENCOUNTER — Ambulatory Visit (HOSPITAL_COMMUNITY)
Admission: RE | Admit: 2023-07-24 | Discharge: 2023-07-24 | Disposition: A | Discharge status: Home / Self Care | Payer: Medicaid Other | Source: Ambulatory Visit | Attending: Cardiology | Admitting: Cardiology

## 2023-07-24 ENCOUNTER — Other Ambulatory Visit: Payer: Disability Insurance

## 2023-07-24 DIAGNOSIS — I5022 Chronic systolic (congestive) heart failure: Secondary | ICD-10-CM | POA: Insufficient documentation

## 2023-07-24 DIAGNOSIS — Z950 Presence of cardiac pacemaker: Secondary | ICD-10-CM | POA: Diagnosis present

## 2023-07-24 DIAGNOSIS — I480 Paroxysmal atrial fibrillation: Secondary | ICD-10-CM | POA: Insufficient documentation

## 2023-07-24 MED ORDER — IOHEXOL 350 MG/ML SOLN
100.0000 mL | Freq: Once | INTRAVENOUS | Status: AC | PRN
  Administered 2023-07-24: 100 mL via INTRAVENOUS

## 2023-07-27 ENCOUNTER — Other Ambulatory Visit: Payer: Self-pay | Admitting: Physician Assistant

## 2023-07-27 DIAGNOSIS — I4819 Other persistent atrial fibrillation: Secondary | ICD-10-CM

## 2023-07-27 DIAGNOSIS — I5042 Chronic combined systolic (congestive) and diastolic (congestive) heart failure: Secondary | ICD-10-CM

## 2023-08-02 ENCOUNTER — Encounter: Payer: Disability Insurance | Admitting: Internal Medicine

## 2023-08-04 ENCOUNTER — Encounter: Payer: Self-pay | Admitting: General Practice

## 2023-08-07 ENCOUNTER — Ambulatory Visit (HOSPITAL_COMMUNITY): Payer: Medicaid Other | Admitting: Certified Registered"

## 2023-08-07 ENCOUNTER — Ambulatory Visit (HOSPITAL_COMMUNITY)
Admission: RE | Disposition: A | Discharge status: Home / Self Care | Payer: Medicaid Other | Source: Ambulatory Visit | Attending: Cardiology

## 2023-08-07 ENCOUNTER — Ambulatory Visit (HOSPITAL_BASED_OUTPATIENT_CLINIC_OR_DEPARTMENT_OTHER): Payer: Medicaid Other | Admitting: Certified Registered"

## 2023-08-07 ENCOUNTER — Other Ambulatory Visit (HOSPITAL_COMMUNITY): Payer: Self-pay

## 2023-08-07 ENCOUNTER — Other Ambulatory Visit: Payer: Self-pay

## 2023-08-07 ENCOUNTER — Ambulatory Visit (HOSPITAL_COMMUNITY)
Admission: RE | Admit: 2023-08-07 | Discharge: 2023-08-07 | Disposition: A | Discharge status: Home / Self Care | Payer: Medicaid Other | Source: Ambulatory Visit | Attending: Cardiology | Admitting: Cardiology

## 2023-08-07 DIAGNOSIS — I484 Atypical atrial flutter: Secondary | ICD-10-CM | POA: Insufficient documentation

## 2023-08-07 DIAGNOSIS — I4891 Unspecified atrial fibrillation: Secondary | ICD-10-CM

## 2023-08-07 DIAGNOSIS — I4819 Other persistent atrial fibrillation: Secondary | ICD-10-CM | POA: Diagnosis present

## 2023-08-07 DIAGNOSIS — I2510 Atherosclerotic heart disease of native coronary artery without angina pectoris: Secondary | ICD-10-CM | POA: Insufficient documentation

## 2023-08-07 DIAGNOSIS — I5022 Chronic systolic (congestive) heart failure: Secondary | ICD-10-CM | POA: Diagnosis not present

## 2023-08-07 DIAGNOSIS — Z8673 Personal history of transient ischemic attack (TIA), and cerebral infarction without residual deficits: Secondary | ICD-10-CM | POA: Insufficient documentation

## 2023-08-07 DIAGNOSIS — Z7901 Long term (current) use of anticoagulants: Secondary | ICD-10-CM | POA: Insufficient documentation

## 2023-08-07 DIAGNOSIS — Z950 Presence of cardiac pacemaker: Secondary | ICD-10-CM | POA: Diagnosis not present

## 2023-08-07 DIAGNOSIS — Z87891 Personal history of nicotine dependence: Secondary | ICD-10-CM | POA: Diagnosis not present

## 2023-08-07 DIAGNOSIS — Z79899 Other long term (current) drug therapy: Secondary | ICD-10-CM | POA: Insufficient documentation

## 2023-08-07 DIAGNOSIS — I4719 Other supraventricular tachycardia: Secondary | ICD-10-CM | POA: Diagnosis not present

## 2023-08-07 DIAGNOSIS — Z951 Presence of aortocoronary bypass graft: Secondary | ICD-10-CM | POA: Diagnosis not present

## 2023-08-07 HISTORY — PX: ATRIAL FIBRILLATION ABLATION: EP1191

## 2023-08-07 LAB — POCT ACTIVATED CLOTTING TIME
Activated Clotting Time: 262 s
Activated Clotting Time: 314 s

## 2023-08-07 SURGERY — ATRIAL FIBRILLATION ABLATION
Anesthesia: General

## 2023-08-07 MED ORDER — PHENYLEPHRINE 80 MCG/ML (10ML) SYRINGE FOR IV PUSH (FOR BLOOD PRESSURE SUPPORT)
PREFILLED_SYRINGE | INTRAVENOUS | Status: DC | PRN
  Administered 2023-08-07 (×3): 80 ug via INTRAVENOUS

## 2023-08-07 MED ORDER — HEPARIN SODIUM (PORCINE) 1000 UNIT/ML IJ SOLN
INTRAMUSCULAR | Status: DC | PRN
  Administered 2023-08-07: 1000 [IU] via INTRAVENOUS

## 2023-08-07 MED ORDER — PANTOPRAZOLE SODIUM 40 MG PO TBEC
40.0000 mg | DELAYED_RELEASE_TABLET | Freq: Every day | ORAL | Status: DC
  Administered 2023-08-07: 40 mg via ORAL
  Filled 2023-08-07: qty 1

## 2023-08-07 MED ORDER — COLCHICINE 0.6 MG PO TABS
0.6000 mg | ORAL_TABLET | Freq: Two times a day (BID) | ORAL | 0 refills | Status: DC
  Filled 2023-08-07: qty 10, 5d supply, fill #0

## 2023-08-07 MED ORDER — PROPOFOL 10 MG/ML IV BOLUS
INTRAVENOUS | Status: DC | PRN
  Administered 2023-08-07: 120 mg via INTRAVENOUS

## 2023-08-07 MED ORDER — ROCURONIUM BROMIDE 10 MG/ML (PF) SYRINGE
PREFILLED_SYRINGE | INTRAVENOUS | Status: DC | PRN
  Administered 2023-08-07: 50 mg via INTRAVENOUS
  Administered 2023-08-07: 20 mg via INTRAVENOUS

## 2023-08-07 MED ORDER — SODIUM CHLORIDE 0.9 % IV SOLN: 250.0000 mL | INTRAVENOUS | Status: DC | PRN

## 2023-08-07 MED ORDER — SUGAMMADEX SODIUM 200 MG/2ML IV SOLN
INTRAVENOUS | Status: DC | PRN
  Administered 2023-08-07: 200 mg via INTRAVENOUS

## 2023-08-07 MED ORDER — LIDOCAINE 2% (20 MG/ML) 5 ML SYRINGE
INTRAMUSCULAR | Status: DC | PRN
  Administered 2023-08-07: 60 mg via INTRAVENOUS

## 2023-08-07 MED ORDER — HEPARIN SODIUM (PORCINE) 1000 UNIT/ML IJ SOLN
INTRAMUSCULAR | Status: DC | PRN
  Administered 2023-08-07: 13000 [IU] via INTRAVENOUS
  Administered 2023-08-07: 6000 [IU] via INTRAVENOUS
  Administered 2023-08-07: 4000 [IU] via INTRAVENOUS

## 2023-08-07 MED ORDER — HEPARIN (PORCINE) IN NACL 1000-0.9 UT/500ML-% IV SOLN
INTRAVENOUS | Status: DC | PRN
  Administered 2023-08-07 (×3): 500 mL

## 2023-08-07 MED ORDER — APIXABAN 5 MG PO TABS
5.0000 mg | ORAL_TABLET | Freq: Two times a day (BID) | ORAL | Status: DC
  Administered 2023-08-07: 5 mg via ORAL
  Filled 2023-08-07: qty 1

## 2023-08-07 MED ORDER — FENTANYL CITRATE (PF) 250 MCG/5ML IJ SOLN
INTRAMUSCULAR | Status: DC | PRN
  Administered 2023-08-07: 100 ug via INTRAVENOUS

## 2023-08-07 MED ORDER — PROTAMINE SULFATE 10 MG/ML IV SOLN
INTRAVENOUS | Status: DC | PRN
  Administered 2023-08-07: 35 mg via INTRAVENOUS

## 2023-08-07 MED ORDER — MIDAZOLAM HCL 2 MG/2ML IJ SOLN
INTRAMUSCULAR | Status: DC | PRN
  Administered 2023-08-07: 2 mg via INTRAVENOUS

## 2023-08-07 MED ORDER — CEFAZOLIN SODIUM-DEXTROSE 2-4 GM/100ML-% IV SOLN
INTRAVENOUS | Status: AC
  Filled 2023-08-07: qty 100

## 2023-08-07 MED ORDER — CEFAZOLIN SODIUM-DEXTROSE 2-3 GM-%(50ML) IV SOLR
INTRAVENOUS | Status: DC | PRN
Start: 2023-08-07 — End: 2023-08-07
  Administered 2023-08-07: 2 g via INTRAVENOUS

## 2023-08-07 MED ORDER — ONDANSETRON HCL 4 MG/2ML IJ SOLN
INTRAMUSCULAR | Status: DC | PRN
  Administered 2023-08-07: 4 mg via INTRAVENOUS

## 2023-08-07 MED ORDER — SODIUM CHLORIDE 0.9 % IV SOLN: INTRAVENOUS | Status: DC

## 2023-08-07 MED ORDER — COLCHICINE 0.6 MG PO TABS
0.6000 mg | ORAL_TABLET | Freq: Two times a day (BID) | ORAL | Status: DC
  Administered 2023-08-07: 0.6 mg via ORAL
  Filled 2023-08-07: qty 1

## 2023-08-07 MED ORDER — SODIUM CHLORIDE 0.9% FLUSH: 3.0000 mL | Freq: Two times a day (BID) | INTRAVENOUS | Status: DC

## 2023-08-07 MED ORDER — ACETAMINOPHEN 325 MG PO TABS
650.0000 mg | ORAL_TABLET | ORAL | Status: DC | PRN
Start: 2023-08-07 — End: 2023-08-07

## 2023-08-07 MED ORDER — ATROPINE SULFATE 1 MG/10ML IJ SOSY
PREFILLED_SYRINGE | INTRAMUSCULAR | Status: AC
  Filled 2023-08-07: qty 10

## 2023-08-07 MED ORDER — EPHEDRINE SULFATE-NACL 50-0.9 MG/10ML-% IV SOSY
PREFILLED_SYRINGE | INTRAVENOUS | Status: DC | PRN
  Administered 2023-08-07: 10 mg via INTRAVENOUS

## 2023-08-07 MED ORDER — ONDANSETRON HCL 4 MG/2ML IJ SOLN
4.0000 mg | Freq: Four times a day (QID) | INTRAMUSCULAR | Status: DC | PRN
Start: 2023-08-07 — End: 2023-08-07

## 2023-08-07 MED ORDER — HEPARIN SODIUM (PORCINE) 1000 UNIT/ML IJ SOLN
INTRAMUSCULAR | Status: AC
  Filled 2023-08-07: qty 10

## 2023-08-07 MED ORDER — PANTOPRAZOLE SODIUM 40 MG PO TBEC
40.0000 mg | DELAYED_RELEASE_TABLET | Freq: Every day | ORAL | 0 refills | Status: AC
  Filled 2023-08-07: qty 45, 45d supply, fill #0

## 2023-08-07 MED ORDER — SODIUM CHLORIDE 0.9% FLUSH: 3.0000 mL | INTRAVENOUS | Status: DC | PRN

## 2023-08-07 SURGICAL SUPPLY — 22 items
BAG SNAP BAND KOVER 36X36 (MISCELLANEOUS) IMPLANT
CABLE PFA RX CATH CONN (CABLE) IMPLANT
CATH ABLAT QDOT MICRO BI TC FJ (CATHETERS) IMPLANT
CATH FARAWAVE ABLATION 31 (CATHETERS) IMPLANT
CATH OCTARAY 2.0 F 3-3-3-3-3 (CATHETERS) IMPLANT
CATH SOUNDSTAR ECO 8FR (CATHETERS) IMPLANT
CATH WEB BI DIR CSDF CRV REPRO (CATHETERS) IMPLANT
CLOSURE PERCLOSE PROSTYLE (VASCULAR PRODUCTS) IMPLANT
COVER SWIFTLINK CONNECTOR (BAG) ×1 IMPLANT
DILATOR VESSEL 38 20CM 16FR (INTRODUCER) IMPLANT
GUIDEWIRE INQWIRE 1.5J.035X260 (WIRE) IMPLANT
INQWIRE 1.5J .035X260CM (WIRE) ×1
KIT VERSACROSS CNCT FARADRIVE (KITS) IMPLANT
PACK EP LF (CUSTOM PROCEDURE TRAY) ×1 IMPLANT
PAD DEFIB RADIO PHYSIO CONN (PAD) ×1 IMPLANT
PATCH CARTO3 (PAD) IMPLANT
SHEATH FARADRIVE STEERABLE (SHEATH) IMPLANT
SHEATH PINNACLE 8F 10CM (SHEATH) IMPLANT
SHEATH PINNACLE 9F 10CM (SHEATH) IMPLANT
SHEATH PROBE COVER 6X72 (BAG) IMPLANT
TUBING SMART ABLATE COOLFLOW (TUBING) IMPLANT
WIRE HI TORQ VERSACORE-J 145CM (WIRE) IMPLANT

## 2023-08-07 NOTE — Transfer of Care (Signed)
Immediate Anesthesia Transfer of Care Note  Patient: Ernest Mallick Mikelson  Procedure(s) Performed: ATRIAL FIBRILLATION ABLATION  Patient Location: Cath Lab  Anesthesia Type:General  Level of Consciousness: awake, drowsy, and patient cooperative  Airway & Oxygen Therapy: Patient Spontanous Breathing and Patient connected to nasal cannula oxygen  Post-op Assessment: Report given to RN and Post -op Vital signs reviewed and stable  Post vital signs: Reviewed and stable  Last Vitals:  Vitals Value Taken Time  BP    Temp    Pulse 60 08/07/23 1224  Resp 13 08/07/23 1224  SpO2 96 % 08/07/23 1224  Vitals shown include unfiled device data.  Last Pain:  Vitals:   08/07/23 0834  TempSrc: Oral         Complications: No notable events documented.

## 2023-08-07 NOTE — Progress Notes (Signed)
Pt ambulated without difficulty or bleeding.  Discharge instructions given to pt and daughter who verbalize understanding and deny further questions.  Discharged home with  daughter who will drive and stay with pt x 24 hrs.

## 2023-08-07 NOTE — H&P (Signed)
Electrophysiology Office Note:     Date:  08/07/2023    ID:  Ashley [name], DOB 04-07-1966, MRN [id number]   [facility] Cardiologist:  Dietrich Pates, MD  [facility] Electrophysiologist:  Sherryl Manges, MD    Referring MD: Hoy Register, MD    Chief Complaint: Atrial tachycardia   History of Present Illness:     Ashley [name] is a 57 y.o. femalewho I am seeing today for an evaluation of atrial tachycardia at the request of Dr. Graciela Husbands.   The patient was last seen by Dr. Graciela Husbands on February 28, 2019 for.   The patient has a medical history that includes atrial fibrillation/flutter, sinus node dysfunction, permanent pacemaker implanted in 2020, coronary artery disease post CABG, mitral valve prolapse post annuloplasty and frequent PVCs she was previously on Tikosyn for her arrhythmias but this had to be stopped due to QT prolongation.  She was started on amiodarone.  Uses Eliquis for stroke prophylaxis.  Does have a history of prior stroke treated with tPA in 2017.  Her amiodarone had to be stopped due to hyperthyroidism in July 2023.  At her most recent appointment she was started on Multaq.  Catheter ablation was discussed and she wished to proceed if a candidate.     She is short of breath and fatigued while out of rhythm.  She does not appreciate the palpitations as much as the general malaise and shortness of breath.  Her son can tell when she is out of rhythm.  He is with her today in clinic.         Presents for AF/AFL ablation today. Procedure reviewed.   Objective Their past medical, social and family history was reveiwed.     ROS:   Please see the history of present illness.    All other systems reviewed and are negative.   EKGs/Labs/Other Studies Reviewed:     The following studies were reviewed today:   April 04, 2023 in clinic device interrogation personally reviewed Battery longevity 6.8 years Atrial pacing 6.3% Ventricular pacing 1.4% Paroxysmal  AT with atrial rates in the 130s to 150s.  Episodes lasting as long as 7 hours.  Atrial tachycardia today during the appointment converted to sinus rhythm.  Her burden has been increasing since July   March 20, 2023 device interrogation showed a 21% AT/a flutter burden.  Heart rates greater than 100 bpm   May 18, 2022 echo EF 40% Global hypokinesis Hyperdynamic right ventricle Moderately dilated left atrium Mild to moderate MR   February 28, 2023 EKG shows likely atrial tachycardia with short PR.  Incomplete right bundle-branch block   EKG Interpretation Date/Time:                  Tuesday April 04 2023 10:09:18 EDT Ventricular Rate:         136 PR Interval:                 190 QRS Duration:             122 QT Interval:                 372 QTC Calculation:559 R Axis:                         92   Text Interpretation:Atrial tachycardia Right bundle branch block Confirmed by Steffanie Dunn [phone]) on 04/04/2023 10:20:49 AM      Physical Exam:  VS:  BP 131/65 (BP Location: Left Arm, Patient Position: Sitting, Cuff Size: Normal)   Pulse 65   Ht 5' 8.5" (1.74 m)   Wt 188 lb (85.3 kg)   LMP 05/18/2015 Comment: went for 3 week after skipping two months   BMI 28.17 kg/m         Wt Readings from Last 3 Encounters:  04/04/23 188 lb (85.3 kg)  03/31/23 188 lb (85.3 kg)  02/28/23 183 lb 8 oz (83.2 kg)      GEN:  Well nourished, well developed in no acute distress CARDIAC: RRR, no murmurs, rubs, gallops RESPIRATORY:  Clear to auscultation without rales, wheezing or rhonchi    During the visit today she converted from atrial tachycardia to sinus rhythm. Assessment ASSESSMENT AND PLAN:     1. Chronic systolic heart failure (HCC)   2. Pacemaker   3. PAF (paroxysmal atrial fibrillation) (HCC)       #Paroxysmal atrial fibrillation/flutter/atrial tachycardia Failed prior treatment with Tikosyn and amiodarone.  Is associated with a reduced ejection fraction.  Rhythm control  indicated.  I discussed treatment options for her arrhythmias including catheter ablation and she wishes to proceed with scheduling.   Her ablation lesion set would consist of a PVI plus CTI +/- posterior wall with mapping/ablation of any other inducible flutters/atrial tachycardias.   Discussed treatment options today for AF including antiarrhythmic drug therapy and ablation. Discussed risks, recovery and likelihood of success with each treatment strategy. Risk, benefits, and alternatives to EP study and ablation for afib were discussed. These risks include but are not limited to stroke, bleeding, vascular damage, tamponade, perforation, damage to the esophagus, lungs, phrenic nerve and other structures, pulmonary vein stenosis, worsening renal function, coronary vasospasm and death.  Discussed potential need for repeat ablation procedures and antiarrhythmic drugs after an initial ablation. The patient understands these risk and wishes to proceed.  We will therefore proceed with catheter ablation at the next available time.  Carto, ICE, anesthesia are requested for the procedure.  Will also obtain CT PV protocol prior to the procedure to exclude LAA thrombus and further evaluate atrial anatomy.   Continue Multaq.   #Chronic systolic heart failure NYHA class II-III.  Warm and dry on exam today.  EF 40% on recent echo.  Continue Farxiga, Lasix, metoprolol, Entresto.   #Permanent pacemaker in situ Device functioning appropriately, continue remote monitoring    Presents for AF/AFL ablation today. Procedure reviewed.   Signed, Rossie Muskrat. Lalla Brothers, MD, [facility], [facility] 04/04/2023 10:45 AM    Electrophysiology [facility]

## 2023-08-07 NOTE — Progress Notes (Signed)
Patient's device checked by [employer] rep Darrow Bussing after procedure.

## 2023-08-07 NOTE — Anesthesia Procedure Notes (Signed)
Procedure Name: Intubation Date/Time: 08/07/2023 10:13 AM  Performed by: Gus Puma, CRNAPre-anesthesia Checklist: Patient identified, Emergency Drugs available, Suction available and Patient being monitored Patient Re-evaluated:Patient Re-evaluated prior to induction Oxygen Delivery Method: Circle System Utilized Preoxygenation: Pre-oxygenation with 100% oxygen Induction Type: IV induction Ventilation: Mask ventilation without difficulty Laryngoscope Size: Mac and 3 Grade View: Grade I Tube type: Oral Tube size: 7.0 mm Number of attempts: 1 Airway Equipment and Method: Stylet Placement Confirmation: ETT inserted through vocal cords under direct vision, positive ETCO2 and breath sounds checked- equal and bilateral Secured at: 21 cm Tube secured with: Tape Dental Injury: Teeth and Oropharynx as per pre-operative assessment

## 2023-08-07 NOTE — Discharge Instructions (Signed)

## 2023-08-07 NOTE — Anesthesia Postprocedure Evaluation (Signed)
Anesthesia Post Note  Patient: Ashley [name]  Procedure(s) Performed: ATRIAL FIBRILLATION ABLATION     Patient location during evaluation: Cath Lab Anesthesia Type: General Level of consciousness: awake and alert Pain management: pain level controlled Vital Signs Assessment: post-procedure vital signs reviewed and stable Respiratory status: spontaneous breathing, nonlabored ventilation and respiratory function stable Cardiovascular status: blood pressure returned to baseline and stable Postop Assessment: no apparent nausea or vomiting Anesthetic complications: no  No notable events documented.  Last Vitals:  Vitals:   08/07/23 1250 08/07/23 1255  BP: 134/72 (!) 142/60  Pulse: 60 60  Resp: 17 16  Temp:  36.9 C  SpO2: 98% 99%    Last Pain:  Vitals:   08/07/23 1255  TempSrc: Temporal  PainSc: 3                  Ivo Moga,W. EDMOND

## 2023-08-07 NOTE — Progress Notes (Signed)
Patient was given discharge instructions. She verbalized understanding. 

## 2023-08-07 NOTE — Anesthesia Preprocedure Evaluation (Signed)
Anesthesia Evaluation  Patient identified by MRN, date of birth, ID band Patient awake    Reviewed: Allergy & Precautions, H&P , NPO status , Patient's Chart, lab work & pertinent test results, reviewed documented beta blocker date and time   Airway Mallampati: I  TM Distance: >3 FB Neck ROM: Full    Dental no notable dental hx. (+) Edentulous Upper, Edentulous Lower, Dental Advisory Given   Pulmonary neg pulmonary ROS, former smoker   Pulmonary exam normal breath sounds clear to auscultation       Cardiovascular hypertension, Pt. on medications and Pt. on home beta blockers + CAD, + CABG and +CHF  + dysrhythmias Atrial Fibrillation + pacemaker + Valvular Problems/Murmurs MR  Rhythm:Irregular Rate:Normal     Neuro/Psych CVA, Residual Symptoms  negative psych ROS   GI/Hepatic negative GI ROS, Neg liver ROS,,,  Endo/Other  negative endocrine ROS    Renal/GU negative Renal ROS  negative genitourinary   Musculoskeletal   Abdominal   Peds  Hematology negative hematology ROS (+)   Anesthesia Other Findings   Reproductive/Obstetrics negative OB ROS                             Anesthesia Physical Anesthesia Plan  ASA: 3  Anesthesia Plan: General   Post-op Pain Management: Ofirmev IV (intra-op)*   Induction: Intravenous  PONV Risk Score and Plan: 4 or greater and Ondansetron, Dexamethasone and Treatment may vary due to age or medical condition  Airway Management Planned: Oral ETT  Additional Equipment:   Intra-op Plan:   Post-operative Plan: Extubation in OR  Informed Consent: I have reviewed the patients History and Physical, chart, labs and discussed the procedure including the risks, benefits and alternatives for the proposed anesthesia with the patient or authorized representative who has indicated his/her understanding and acceptance.     Dental advisory given  Plan Discussed  with: CRNA  Anesthesia Plan Comments:        Anesthesia Quick Evaluation

## 2023-08-08 ENCOUNTER — Encounter (HOSPITAL_COMMUNITY): Payer: Self-pay | Admitting: Cardiology

## 2023-08-09 MED FILL — Cefazolin Sodium-Dextrose IV Solution 2 GM/100ML-4%: INTRAVENOUS | Qty: 100 | Status: AC

## 2023-08-09 MED FILL — Atropine Sulfate Soln Prefill Syr 1 MG/10ML (0.1 MG/ML): INTRAMUSCULAR | Qty: 10 | Status: AC

## 2023-08-14 ENCOUNTER — Other Ambulatory Visit: Payer: Self-pay

## 2023-08-14 MED ORDER — ENTRESTO 24-26 MG PO TABS: 1.0000 | ORAL_TABLET | Freq: Two times a day (BID) | ORAL | 2 refills | Status: DC

## 2023-08-17 ENCOUNTER — Other Ambulatory Visit: Payer: Self-pay

## 2023-08-17 IMAGING — DX DG CHEST 2V
2 series · 2 of 2 positions shown · non-contrast
Comparison: June 26, 2019

CLINICAL DATA: Shortness of breath.

EXAM:
CHEST - 2 VIEW

[chest pa]
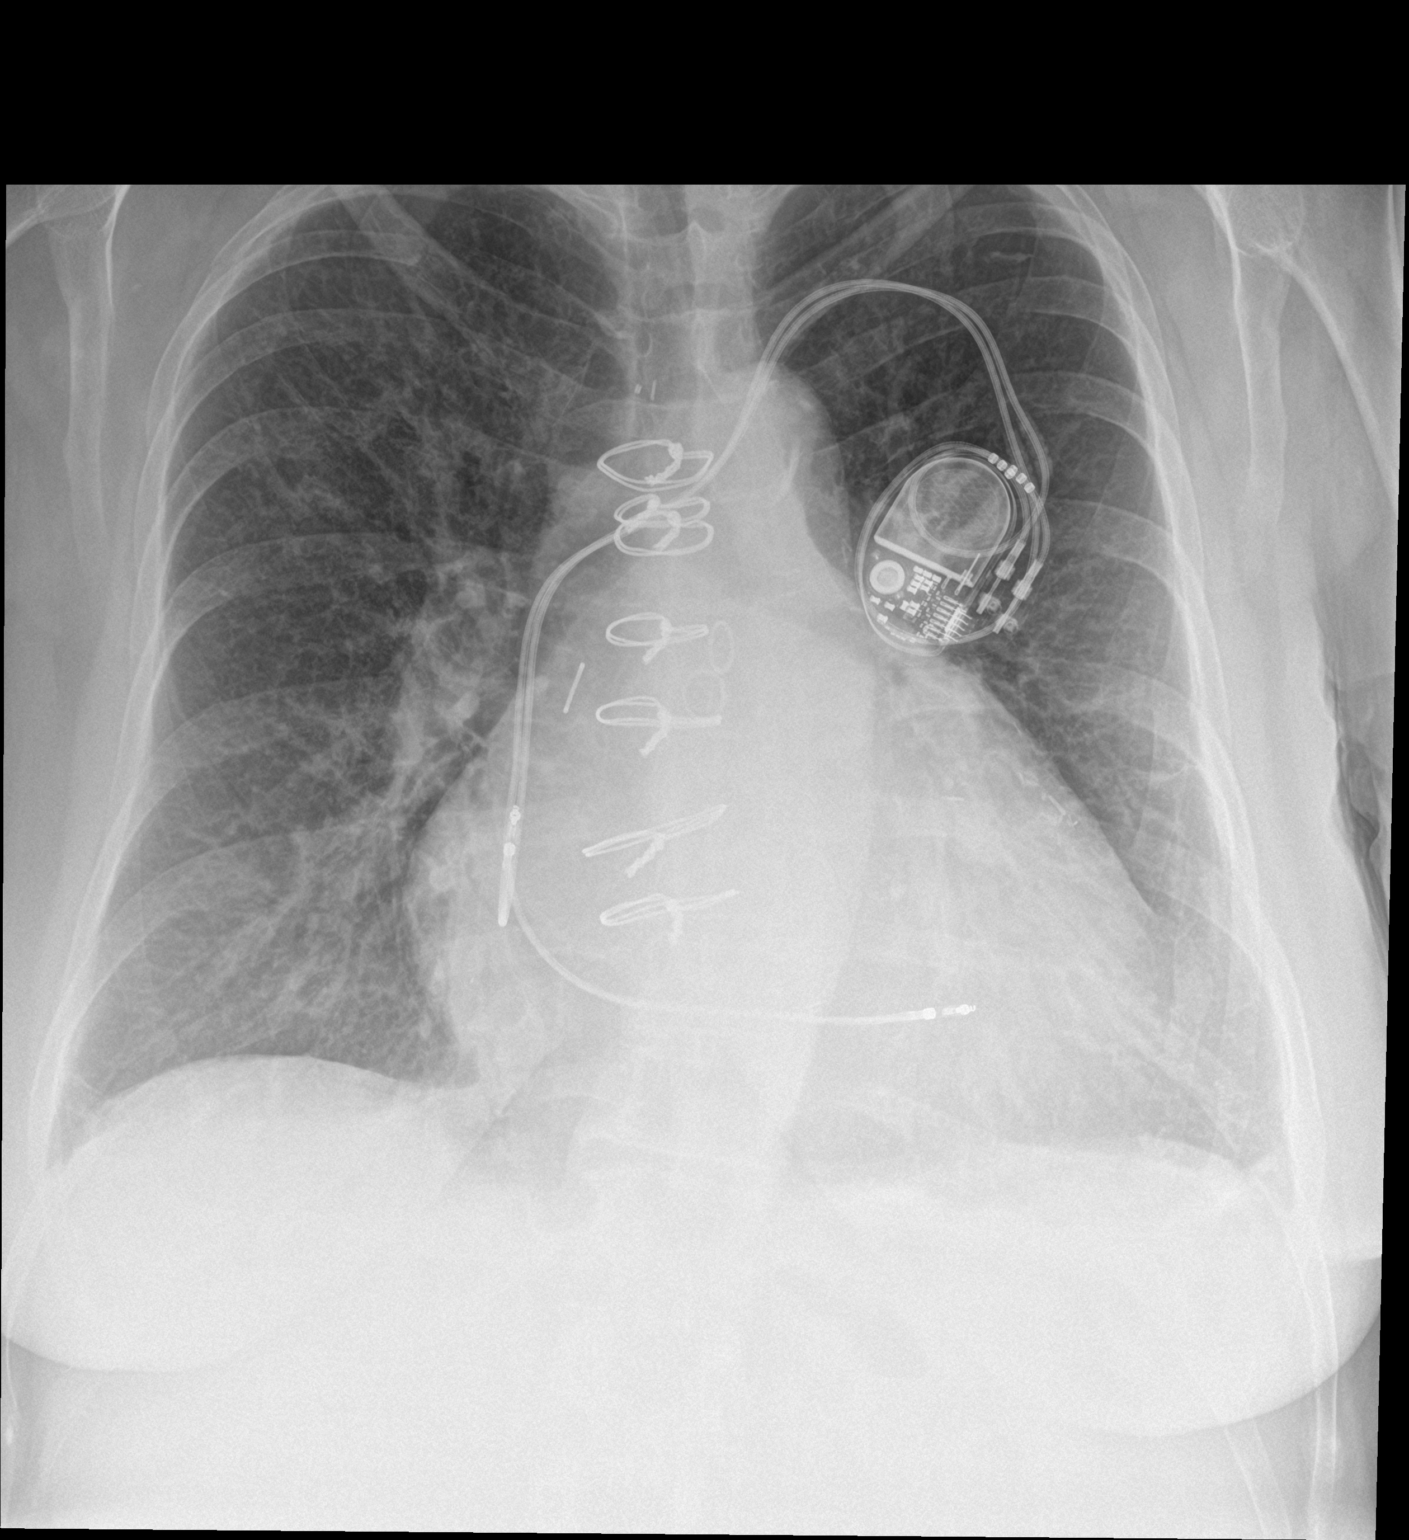

[chest lat]
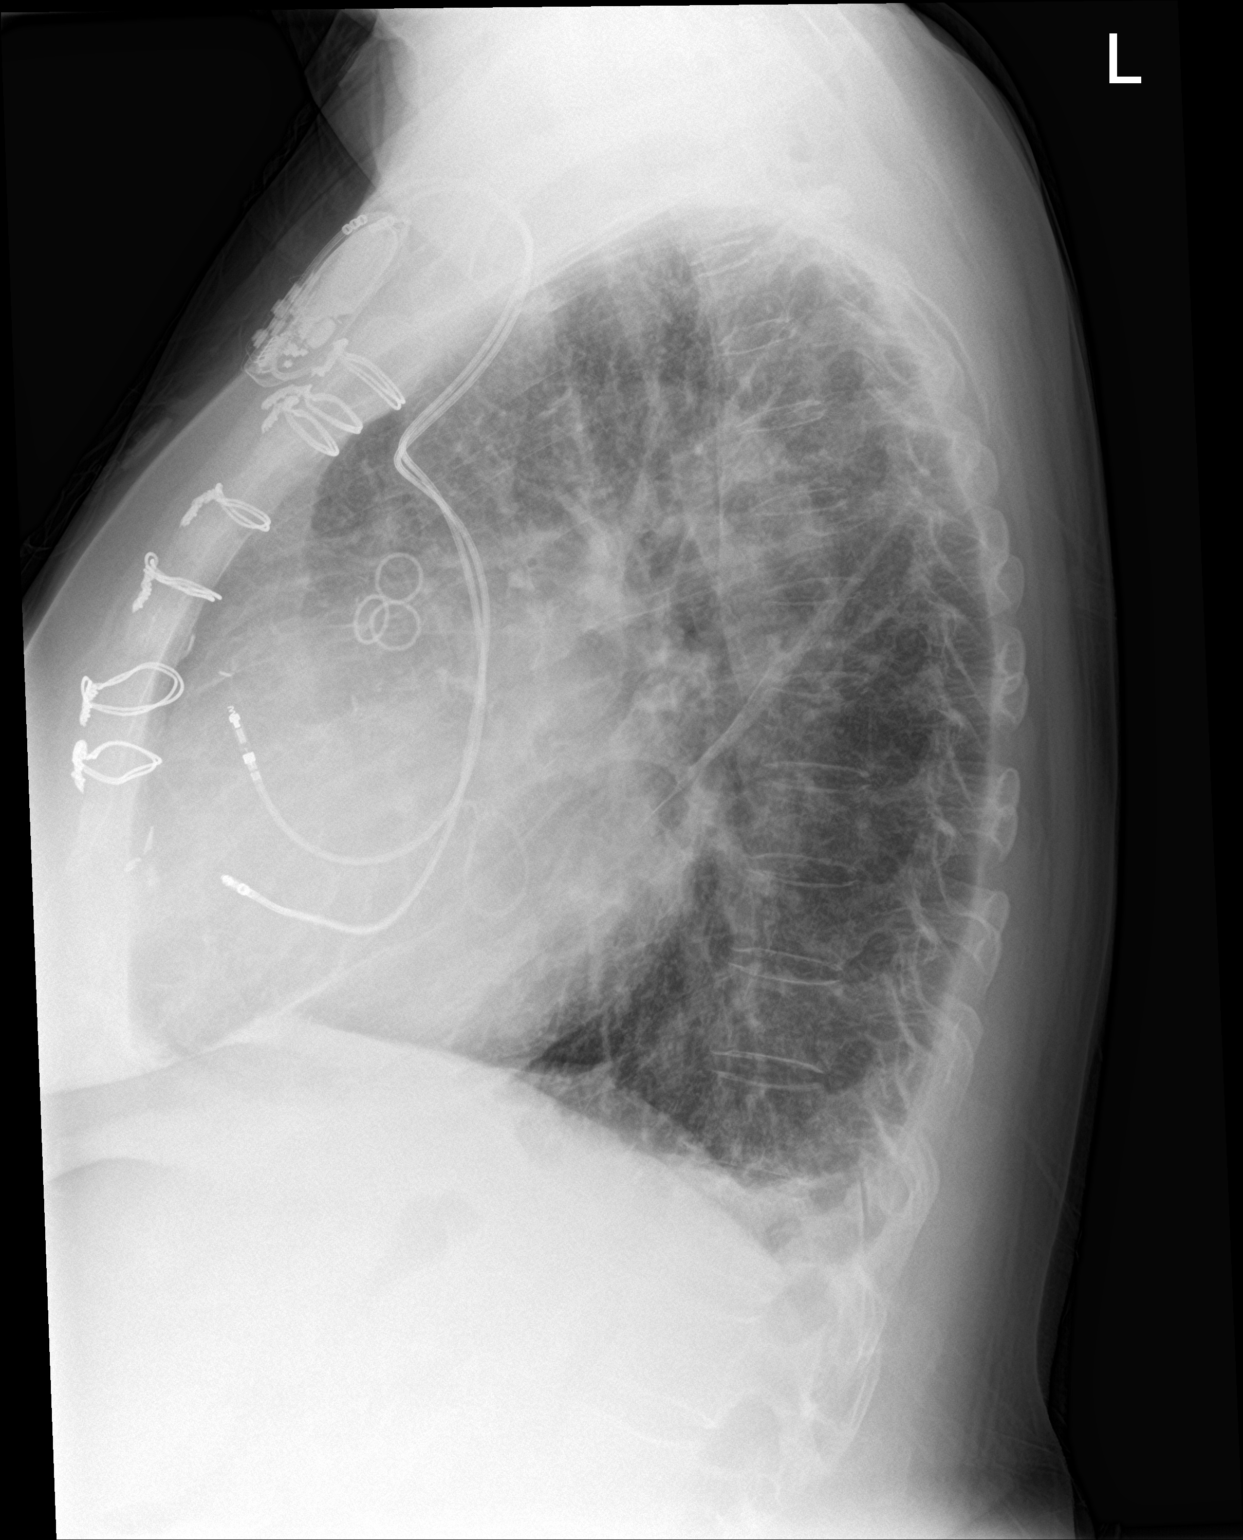

[2 of 2 positions shown; findings below may reference images not displayed]

FINDINGS: There is a dual lead AICD. Multiple sternal wires and vascular clips
are seen. Stable, chronic appearing increased lung markings are
noted without evidence of focal consolidation, pleural effusion or
pneumothorax. The cardiac silhouette is mildly enlarged and
unchanged in size. There is moderate severity calcification of the
aortic arch. The visualized skeletal structures are unremarkable.
IMPRESSION: 1. Evidence of prior median sternotomy/CABG.
2. Stable cardiomegaly with chronic appearing increased lung
markings.
3. No acute or active cardiopulmonary disease.

## 2023-08-17 MED ORDER — ENTRESTO 24-26 MG PO TABS: 1.0000 | ORAL_TABLET | Freq: Two times a day (BID) | ORAL | 2 refills | Status: DC

## 2023-08-30 ENCOUNTER — Other Ambulatory Visit: Payer: Self-pay | Admitting: Family Medicine

## 2023-09-03 ENCOUNTER — Other Ambulatory Visit: Payer: Self-pay | Admitting: Family Medicine

## 2023-09-03 DIAGNOSIS — I5042 Chronic combined systolic (congestive) and diastolic (congestive) heart failure: Secondary | ICD-10-CM

## 2023-09-03 DIAGNOSIS — I4819 Other persistent atrial fibrillation: Secondary | ICD-10-CM

## 2023-09-04 ENCOUNTER — Ambulatory Visit (HOSPITAL_COMMUNITY)
Admission: RE | Admit: 2023-09-04 | Discharge: 2023-09-04 | Disposition: A | Discharge status: Home / Self Care | Payer: Medicaid Other | Source: Ambulatory Visit | Attending: Internal Medicine | Admitting: Internal Medicine

## 2023-09-04 VITALS — BP 124/60 | HR 65 | Ht 68.0 in | Wt 188.2 lb

## 2023-09-04 DIAGNOSIS — Z823 Family history of stroke: Secondary | ICD-10-CM | POA: Diagnosis not present

## 2023-09-04 DIAGNOSIS — I4891 Unspecified atrial fibrillation: Secondary | ICD-10-CM

## 2023-09-04 DIAGNOSIS — E119 Type 2 diabetes mellitus without complications: Secondary | ICD-10-CM | POA: Insufficient documentation

## 2023-09-04 DIAGNOSIS — I110 Hypertensive heart disease with heart failure: Secondary | ICD-10-CM | POA: Diagnosis not present

## 2023-09-04 DIAGNOSIS — I5042 Chronic combined systolic (congestive) and diastolic (congestive) heart failure: Secondary | ICD-10-CM | POA: Insufficient documentation

## 2023-09-04 DIAGNOSIS — Z6828 Body mass index (BMI) 28.0-28.9, adult: Secondary | ICD-10-CM | POA: Diagnosis not present

## 2023-09-04 DIAGNOSIS — Z950 Presence of cardiac pacemaker: Secondary | ICD-10-CM | POA: Diagnosis not present

## 2023-09-04 DIAGNOSIS — I4819 Other persistent atrial fibrillation: Secondary | ICD-10-CM | POA: Insufficient documentation

## 2023-09-04 DIAGNOSIS — E669 Obesity, unspecified: Secondary | ICD-10-CM | POA: Insufficient documentation

## 2023-09-04 DIAGNOSIS — I495 Sick sinus syndrome: Secondary | ICD-10-CM | POA: Diagnosis not present

## 2023-09-04 DIAGNOSIS — Z7182 Exercise counseling: Secondary | ICD-10-CM | POA: Diagnosis not present

## 2023-09-04 DIAGNOSIS — Z5181 Encounter for therapeutic drug level monitoring: Secondary | ICD-10-CM

## 2023-09-04 DIAGNOSIS — Z79899 Other long term (current) drug therapy: Secondary | ICD-10-CM

## 2023-09-04 DIAGNOSIS — I2510 Atherosclerotic heart disease of native coronary artery without angina pectoris: Secondary | ICD-10-CM | POA: Insufficient documentation

## 2023-09-04 DIAGNOSIS — Z8673 Personal history of transient ischemic attack (TIA), and cerebral infarction without residual deficits: Secondary | ICD-10-CM | POA: Diagnosis not present

## 2023-09-04 DIAGNOSIS — D6869 Other thrombophilia: Secondary | ICD-10-CM

## 2023-09-04 DIAGNOSIS — I4892 Unspecified atrial flutter: Secondary | ICD-10-CM | POA: Insufficient documentation

## 2023-09-04 NOTE — Progress Notes (Signed)
 Primary Care Physician: Ashley Clam, MD Primary Cardiologist: Dr Ashley [name] Primary Electrophysiologist: Dr Ashley [name] Referring Physician: Dr Ashley [name] is a 58 y.o. female with a history of atrial fibrillation, atrial flutter w/ RVR in 10/2018 with associate LV dysfunction, CAD s/p CABG 2013, MVP with severe MR s/p repair, CVA, HTN, and tobacco abuse who presents for follow up in the AF Clinic. Patient underwent DCCV in 01/2019 but had ERAF.  She developed profound bradycardia and junctional rhythm and her Digoxin  and Diltiazem  was DC'd. Her rate was uncontrolled again and she was symptomatic. She then saw Dr. Fernande who recommended Dofetilide . She was admitted for dofetilide  loading but unfortunately had QT prolongation on the full dose and breakthrough afib on lower doses. She was started on amiodarone  05/06/19. Patient underwent DCCV on 06/14/19 and had a 10 second pause followed by a junctional rhythm requiring atropine  and ephedrine . She had a Zio patch placed which showed significant pauses and bradycardia. She had a PPM implanted on 06/25/19. She is on warfarin for a CHADS2VASC score of 6.  On follow up today, patient reports that she has done well since leaving the hospital. She states that she feels like she has more energy since the PPM implant. Device interrogation has shown no mode switches. She is tolerating the medication without difficulty.   On follow up 09/04/23, patient is currently in NSR. S/p Afib ablation on 08/07/23 by Dr. Cindie. She is on Multaq . No episodes of Afib since ablation. No chest pain or SOB. Leg sites healed without issue. No missed doses of Eliquis  5 mg BID.   Today, she denies symptoms of orthopnea, PND, lower extremity edema, dizziness, presyncope, syncope, snoring, daytime somnolence, bleeding, or neurologic sequela. The patient is tolerating medications without difficulties and is otherwise without complaint today.    she has a BMI of Body mass  index is 28.62 kg/m.Ashley [name]   09/04/23 1041  Weight: 85.4 kg     Family History  Problem Relation Age of Onset   Hypertension Mother    Stroke Mother    Heart disease Father    COPD Father    Bone cancer Maternal Grandmother    Anesthesia problems Neg Hx    Hypotension Neg Hx    Malignant hyperthermia Neg Hx    Pseudochol deficiency Neg Hx    Colon cancer Neg Hx      Atrial Fibrillation Management history:  Previous antiarrhythmic drugs: dofetilide  (failed), amiodarone  Previous cardioversions: 01/25/19, 06/14/19 Previous ablations: 08/07/23 CHADS2VASC score: 6 Anticoagulation history: warfarin   Past Medical History:  Diagnosis Date   Abdominal pain    ? chronic cholecystitis; admxn to [facility] 9/12 (CT, USN, HIDA done)   Atrial fibrillation (HCC)    Cardiomyopathy    echo 9/12: mild LVH, EF 35-40%, mod MR (difficult to judge /post leaflet restricted), mod BAE, mod RVE, mod TR    Cardiomyopathy (HCC)    a. echo 9/12: mild LVH, EF 35-40%, mod MR (difficult to judge /post leaflet restricted), mod BAE, mod RVE, mod TR. b. Echo 11/2011: mild LVH, EF 55-60%, s/p MV repair w/o sig MR/MS, mildly dilated RV/RA, PASP . // AFL dx 10/2018 - Echo 10/2018: EF 30, diff HK, s/p MV repair with trivial MR, mean MV 7 but not c/w significant mitral stenosis, mild RAE, severe LAE, mod reduced RVSF    Carotid artery disease (HCC)    a. Duplex 06/2015: 50% RECA, 1-39% BICA.   CHF (congestive heart failure) (  HCC)    Clotting disorder (HCC)    Coronary artery disease    a. s/p CABG 10/2011 - LIMA-LAD, SVG-PDA, SVG-OM, SVG-diagonal.   Dyslipidemia    Essential hypertension    Heart murmur    Hemifacial spasm    History of Doppler ultrasound    a. Carotid US  [facility] in [place], [place]) 7/17: no hemodynamically significant ICA stenosis   Hyperlipidemia    Mitral regurgitation    a. s/p MV repair 10/2011 - on Coumadin  for 3 months afterwards then d/c'd by surgery.   Recurrent  upper respiratory infection (URI)    S/P CABG x 4 10/21/2011   S/P mitral valve repair 10/21/2011   Stroke (HCC) 1989   a. age 37 - in setting of cigs and OCPs. //  b. s/p acute L parietal CVA >> tPA - [facility] in [place], [place]   Stroke [facility]) 01/2017   Tobacco abuse    Past Surgical History:  Procedure Laterality Date   ATRIAL FIBRILLATION ABLATION N/A 08/07/2023   Procedure: ATRIAL FIBRILLATION ABLATION;  Surgeon: Ashley [name] Ole DASEN, MD;  Location: [facility] INVASIVE CV LAB;  Service: Cardiovascular;  Laterality: N/A;   CARDIAC CATHETERIZATION     CARDIOVERSION N/A 01/25/2019   Procedure: CARDIOVERSION;  Surgeon: Ashley Vinie BROCKS, MD;  Location: [facility] ENDOSCOPY;  Service: Cardiovascular;  Laterality: N/A;   CARDIOVERSION N/A 06/14/2019   Procedure: CARDIOVERSION;  Surgeon: Ashley Vinie BROCKS, MD;  Location: [facility] ENDOSCOPY;  Service: Cardiovascular;  Laterality: N/A;   CESAREAN SECTION  1996   twins   CORONARY ARTERY BYPASS GRAFT  10/21/2011   Procedure: CORONARY ARTERY BYPASS GRAFTING (CABG);  Surgeon: Ashley VEAR Laine, MD;  Location: [facility] OR;  Service: Open Heart Surgery;  Laterality: N/A;  cabg x four, using right leg greater saphenous vein harvested endoscopically   DENTAL SURGERY     MITRAL VALVE REPAIR  10/21/2011   Procedure: MITRAL VALVE REPAIR (MVR);  Surgeon: Ashley VEAR Laine, MD;  Location: [facility] OR;  Service: Open Heart Surgery;  Laterality: N/A;   PACEMAKER IMPLANT N/A 06/25/2019   Procedure: PACEMAKER IMPLANT;  Surgeon: Ashley Agent, MD;  Location: [facility] INVASIVE CV LAB;  Service: Cardiovascular;  Laterality: N/A;   TEE WITHOUT CARDIOVERSION  06/30/2011   Procedure: TRANSESOPHAGEAL ECHOCARDIOGRAM (TEE);  Surgeon: Ashley LULLA Gull, MD;  Location: [facility] ENDOSCOPY;  Service: Cardiovascular;  Laterality: N/A;   TEE WITHOUT CARDIOVERSION N/A 01/19/2022   Procedure: TRANSESOPHAGEAL ECHOCARDIOGRAM (TEE);  Surgeon: Barbaraann Darryle Ned, MD;  Location: [facility] ENDOSCOPY;  Service: Cardiovascular;   Laterality: N/A;   TONSILLECTOMY     TUBAL LIGATION      Current Outpatient Medications  Medication Sig Dispense Refill   acetaminophen  (TYLENOL ) 500 MG tablet Take 500-1,000 mg by mouth as needed for headache.     apixaban  (ELIQUIS ) 5 MG TABS tablet TAKE ONE TABLET BY MOUTH TWICE A DAY 180 tablet 1   ascorbic acid  (VITAMIN C ) 500 MG tablet Take 500 mg by mouth 2 (two) times daily.     atorvastatin  (LIPITOR ) 80 MG tablet TAKE ONE TABLET BY MOUTH ONE TIME DAILY (Patient taking differently: Take 80 mg by mouth at bedtime.) 90 tablet 3   dapagliflozin  propanediol (FARXIGA ) 10 MG TABS tablet TAKE ONE TABLET BY MOUTH ONE TIME DAILY BEFORE BREAKFAST 30 tablet 0   diphenhydrAMINE  (BENADRYL ) 25 MG tablet Take 25 mg by mouth as needed for allergies.     dronedarone  (MULTAQ ) 400 MG tablet Take 1 tablet (400 mg total)  by mouth 2 (two) times daily with a meal. 180 tablet 3   ferrous sulfate  (FEROSUL) 325 (65 FE) MG tablet TAKE ONE TABLET BY MOUTH ONE TIME DAILY WITH BREAKFAST 30 tablet 0   furosemide  (LASIX ) 40 MG tablet TAKE ONE TABLET BY MOUTH ONE TIME DAILY, TAKE AN EXTRA TABLET AT NIGHT AS NEEDED 45 tablet 11   methocarbamol  (ROBAXIN ) 500 MG tablet Take 2 tablets (1,000 mg total) by mouth every 8 (eight) hours as needed for muscle spasms. 90 tablet 0   metoprolol  succinate (TOPROL -XL) 100 MG 24 hr tablet Take 1 tablet (100 mg total) by mouth daily. Take with or immediately following a meal. 90 tablet 3   pantoprazole  (PROTONIX ) 40 MG tablet Take 1 tablet (40 mg total) by mouth daily. No refills need, post procedure medication. 45 tablet 0   sacubitril -valsartan  (ENTRESTO ) 24-26 MG Take 1 tablet by mouth 2 (two) times daily. 180 tablet 2   sodium chloride  (OCEAN) 0.65 % SOLN nasal spray Place 1 spray into both nostrils daily as needed (for congestion).     vitamin B-12 (CYANOCOBALAMIN ) 1000 MCG tablet Take 1 tablet (1,000 mcg total) by mouth daily.     Current Facility-Administered Medications   Medication Dose Route Frequency Provider Last Rate Last Admin   incobotulinumtoxinA  (XEOMIN ) 50 units injection 50 Units  50 Units Intramuscular Q90 days Onita Duos, MD   50 Units at 11/30/16 1546   incobotulinumtoxinA  (XEOMIN ) 50 units injection 50 Units  50 Units Intramuscular Q90 days Onita Duos, MD   50 Units at 07/25/17 0843   incobotulinumtoxinA  (XEOMIN ) 50 units injection 50 Units  50 Units Intramuscular Q90 days Onita Duos, MD   50 Units at 07/26/17 1217   incobotulinumtoxinA  (XEOMIN ) 50 units injection 50 Units  50 Units Intramuscular Q90 days Onita Duos, MD   50 Units at 06/05/23 1138    Allergies  Allergen Reactions   Azithromycin Nausea Only   Latex Rash   Spinach Hives    Cooked    Tape Dermatitis and Other (See Comments)    PLEASE USE PAPER TAPE!! AFFECTS SKIN BADLY!!!!   ROS- All systems are reviewed and negative except as per the HPI above.  Physical Exam: Vitals:   09/04/23 1041  BP: 124/60  Pulse: 65  Weight: 85.4 kg  Height: 5' 8 (1.727 m)    GEN- The patient is well appearing, alert and oriented x 3 today.   Neck - no JVD or carotid bruit noted Lungs- Clear to ausculation bilaterally, normal work of breathing Heart- Regular rate and rhythm, no murmurs, rubs or gallops, PMI not laterally displaced Extremities- no clubbing, cyanosis, or edema Skin - no rash or ecchymosis noted   Wt Readings from Last 3 Encounters:  09/04/23 85.4 kg  08/07/23 83.9 kg  06/05/23 84.4 kg    EKG today demonstrates  Vent. rate 65 BPM PR interval 168 ms QRS duration 122 ms QT/QTcB 446/463 ms P-R-T axes 91 89 60 Normal sinus rhythm Right bundle branch block Abnormal ECG When compared with ECG of 07-Aug-2023 12:44, PREVIOUS ECG IS PRESENT  Echo 05/18/22: 1. Left ventricular ejection fraction, by estimation, is 40%. The left  ventricle has moderately decreased function. The left ventricle  demonstrates global hypokinesis. Left ventricular diastolic parameters are   indeterminate.   2. Right ventricular systolic function is hyperdynamic. The right  ventricular size is normal. There is normal pulmonary artery systolic  pressure.   3. Left atrial size was moderately dilated.   4. The  mitral valve has been repaired/replaced. Mild to moderate mitral  valve regurgitation. No evidence of mitral stenosis. The mean mitral valve  gradient is 4.0 mmHg. Moderate mitral annular calcification.   5. The aortic valve is normal in structure. Aortic valve regurgitation is  not visualized. Aortic valve sclerosis/calcification is present, without  any evidence of aortic stenosis.   6. The inferior vena cava is normal in size with greater than 50%  respiratory variability, suggesting right atrial pressure of 3 mmHg.   Epic records are reviewed at length today  Assessment and Plan:  1. Persistent atrial fibrillation/atrial flutter  Patient failed dofetilide  2/2 QT prolongation and was started on amiodarone  05/06/19.  S/p Afib ablation on 08/07/23 by Dr. Cindie.  She is currently in NSR.  Intervals are stable. Continue Multaq .   This patients CHA2DS2-VASc Score and unadjusted Ischemic Stroke Rate (% per year) is equal to 9.7 % stroke rate/year from a score of 6  Above score calculated as 1 point each if present [CHF, HTN, DM, Vascular=MI/PAD/Aortic Plaque, Age if 65-74, or Female] Above score calculated as 2 points each if present [Age > 75, or Stroke/TIA/TE]   2. Obesity Body mass index is 28.62 kg/m. Lifestyle modification was discussed and encouraged including regular physical activity and weight reduction.  3. CAD S/p CAGB.  No anginal symptoms.  4. Combined chronic systolic/diastolic CHF Recent echo shows EF 40% No signs or symptoms of fluid overload.  5. MVP with MR S/p repair 2013.  6. Sinus node dysfunction S/p PPM by Dr Ashley. Followed by Dr Ashley [name] and the device clinic.  7. HTN BP is great today, no changes.    Follow up with EP as  scheduled.    Ashley Heinrich, PA-C Afib Clinic [facility] [address] [place], [place] [address] [phone] 09/04/2023 11:11 AM

## 2023-09-05 ENCOUNTER — Telehealth: Payer: Self-pay | Admitting: Internal Medicine

## 2023-09-05 DIAGNOSIS — I5042 Chronic combined systolic (congestive) and diastolic (congestive) heart failure: Secondary | ICD-10-CM

## 2023-09-05 DIAGNOSIS — I4819 Other persistent atrial fibrillation: Secondary | ICD-10-CM

## 2023-09-05 NOTE — Telephone Encounter (Signed)
*  STAT* If patient is at the pharmacy, call can be transferred to refill team.   1. Which medications need to be refilled? (please list name of each medication and dose if known)  dapagliflozin  propanediol (FARXIGA ) 10 MG TABS tablet ferrous sulfate  (FEROSUL) 325 (65 FE) MG tablet  2. Which pharmacy/location (including street and city if local pharmacy) is medication to be sent to? [facility] [address] [place], [place] - [address] AT [facility] [address]  3. Do they need a 30 day or 90 day supply? 90 day

## 2023-09-06 NOTE — Telephone Encounter (Signed)
 This is a pt of Dr. Luanne Runner and Michaelle Adolphus PA. Michaelle Adolphus PA refilled the Ferrous Sulfate . Also, The Farxiga  has never been filled by us . Does Dr. Avanell Bob or Michaelle Adolphus want to refill these? Please advise.

## 2023-09-13 ENCOUNTER — Telehealth: Payer: Self-pay | Admitting: Neurology

## 2023-09-13 ENCOUNTER — Ambulatory Visit: Payer: Medicaid Other | Admitting: Neurology

## 2023-09-13 NOTE — Telephone Encounter (Signed)
Pt needed to r/s due to icy conditions on her street.

## 2023-09-19 ENCOUNTER — Ambulatory Visit (INDEPENDENT_AMBULATORY_CARE_PROVIDER_SITE_OTHER): Payer: Self-pay

## 2023-09-19 ENCOUNTER — Encounter: Payer: Self-pay | Admitting: Cardiology

## 2023-09-19 DIAGNOSIS — I4819 Other persistent atrial fibrillation: Secondary | ICD-10-CM | POA: Diagnosis not present

## 2023-09-19 LAB — CUP PACEART REMOTE DEVICE CHECK
Battery Remaining Longevity: 76 mo
Battery Remaining Percentage: 62 %
Battery Voltage: 3.01 V
Brady Statistic AP VP Percent: 1 %
Brady Statistic AP VS Percent: 36 %
Brady Statistic AS VP Percent: 1 %
Brady Statistic AS VS Percent: 63 %
Brady Statistic RA Percent Paced: 32 %
Brady Statistic RV Percent Paced: 1 %
Date Time Interrogation Session: 20250128020014
Implantable Lead Connection Status: 753985
Implantable Lead Connection Status: 753985
Implantable Lead Implant Date: 20201103
Implantable Lead Implant Date: 20201103
Implantable Lead Location: 753859
Implantable Lead Location: 753860
Implantable Pulse Generator Implant Date: 20201103
Lead Channel Impedance Value: 450 Ohm
Lead Channel Impedance Value: 590 Ohm
Lead Channel Pacing Threshold Amplitude: 0.875 V
Lead Channel Pacing Threshold Amplitude: 1.375 V
Lead Channel Pacing Threshold Pulse Width: 0.5 ms
Lead Channel Pacing Threshold Pulse Width: 0.5 ms
Lead Channel Sensing Intrinsic Amplitude: 12 mV
Lead Channel Sensing Intrinsic Amplitude: 5 mV
Lead Channel Setting Pacing Amplitude: 1.125
Lead Channel Setting Pacing Amplitude: 2.375
Lead Channel Setting Pacing Pulse Width: 0.5 ms
Lead Channel Setting Sensing Sensitivity: 2 mV
Pulse Gen Model: 2272
Pulse Gen Serial Number: 9174760

## 2023-09-19 NOTE — Telephone Encounter (Signed)
Pt is calling to f/u on getting a callback at [phone] regarding this refill request on these medications. Please advise.

## 2023-09-20 ENCOUNTER — Other Ambulatory Visit (HOSPITAL_COMMUNITY): Payer: Self-pay

## 2023-09-20 MED ORDER — DAPAGLIFLOZIN PROPANEDIOL 10 MG PO TABS
10.0000 mg | ORAL_TABLET | Freq: Every day | ORAL | 0 refills | Status: DC
  Filled 2023-09-20: qty 90, 90d supply, fill #0

## 2023-09-20 NOTE — Telephone Encounter (Signed)
Refilled

## 2023-09-25 ENCOUNTER — Other Ambulatory Visit: Payer: Self-pay | Admitting: Family Medicine

## 2023-09-25 ENCOUNTER — Other Ambulatory Visit (HOSPITAL_COMMUNITY): Payer: Self-pay

## 2023-09-25 DIAGNOSIS — I5042 Chronic combined systolic (congestive) and diastolic (congestive) heart failure: Secondary | ICD-10-CM

## 2023-09-25 DIAGNOSIS — I4819 Other persistent atrial fibrillation: Secondary | ICD-10-CM

## 2023-09-25 NOTE — Telephone Encounter (Signed)
Copied from CRM [id number]. Topic: Clinical - Medication Refill >> Sep 25, 2023  9:23 AM [name] wrote: Most Recent Primary Care Visit:  Provider: Hoy Register  Department: [facility]  Visit Type: OFFICE VISIT  Date: 02/21/2023  Medication: dapagliflozin propanediol (FARXIGA) 10 MG TABS tablet  Has the patient contacted their pharmacy? Yes At the wrong pharmacy  Is this the correct pharmacy for this prescription? Yes If no, delete pharmacy and type the correct one.  This is the patient's preferred pharmacy:  [facility] [address] [place], [place] - [address] [place]. AT [facility] [address] [place]. [name] [address] Phone: [phone] Fax: [phone]   Has the prescription been filled recently? No  Is the patient out of the medication? Yes  Has the patient been seen for an appointment in the last year OR does the patient have an upcoming appointment? Yes  Can we respond through MyChart? No  Agent: Please be advised that Rx refills may take up to 3 business days. We ask that you follow-up with your pharmacy.

## 2023-09-27 ENCOUNTER — Other Ambulatory Visit: Payer: Self-pay

## 2023-09-27 ENCOUNTER — Telehealth: Payer: Self-pay

## 2023-09-27 NOTE — Telephone Encounter (Signed)
 Pharmacy Patient Advocate Encounter  Received notification from [facility] MEDICAID that Prior Authorization for FARXIGA  has been APPROVED from 09/27/2023 to 09/26/2024   PA #/Case ID/Reference #: [id number]

## 2023-10-18 ENCOUNTER — Ambulatory Visit: Payer: Medicaid Other | Admitting: Neurology

## 2023-10-18 VITALS — BP 142/75 | HR 75

## 2023-10-18 DIAGNOSIS — G5131 Clonic hemifacial spasm, right: Secondary | ICD-10-CM | POA: Diagnosis not present

## 2023-10-18 MED ORDER — INCOBOTULINUMTOXINA 50 UNITS IM SOLR
50.0000 [IU] | INTRAMUSCULAR | Status: AC
Start: 2023-10-18 — End: ?
  Administered 2023-10-25: 50 [IU] via INTRAMUSCULAR

## 2023-10-18 NOTE — Progress Notes (Signed)
 Chief Complaint  Patient presents with   Injections    Pt in 14,  Pt is here for Xeomin injections  for hemifacial spasms.       ASSESSMENT AND PLAN  Ashley [name] is a 58 y.o. female   Right hemifacial spasm 50 units xeomin is dissolved into 1cc NS.  Under EMG guidance 2.5 units at each injection site    DIAGNOSTIC DATA (LABS, IMAGING, TESTING) - I reviewed patient records, labs, notes, testing and imaging myself where available.   MEDICAL HISTORY:  Ashley [name] is a 58 years old female, seen in request by her primary physician Dr. Alvis Lemmings, Odette Horns for evaluation of right hemifacial spasm, botulinum toxin injection.   I reviewed and summarized the referring note. PMHX. Stroke Right hemifacial spasm Atrial fibrillation HTN CHF CAD Cardiomyopathy, moderate MR, s/p MV repair Smoke Pacemaker placement   She has history of stroke, multiple cardiac condition, atrial fibrillation, taking Eliquis  She started to have intermittent right hemifacial spasm since 2013, gradually getting worse, I saw her in 2017, had low dose xeomin injection, which was helpful,  lost follow up since last injection in Dec 2018 due to insurance reasons.   Now she has frequent right facial spasm, hope to receive botulinum toxin injection again, understanding potential risk of injection,    UPDATE Jun 05 2023: She responded well to this injection no significant side effect noted, at the end of the 3 months, she noticed mild recurrence of right facial twitching  UPDATE Feb 26th 2025: She did well with injection, no significant side effect noted  PHYSICAL EXAM:   Vitals:   10/18/23 1049  BP: (!) 142/75  Pulse: 75     There is no height or weight on file to calculate BMI.  PHYSICAL EXAMNIATION:  Gen: NAD, conversant, well nourised, well groomed                     Cardiovascular: Regular rate rhythm, no peripheral edema, warm, nontender. Eyes: Conjunctivae clear  without exudates or hemorrhage Neck: Supple, no carotid bruits. Pulmonary: Clear to auscultation bilaterally   NEUROLOGICAL EXAM:  MENTAL STATUS: Speech/cognition: Awake, alert, oriented to history taking and casual conversation CRANIAL NERVES: CN II: Visual fields are full to confrontation. Pupils are round equal and briskly reactive to light. CN III, IV, VI: extraocular movement are normal. No ptosis. CN V: Facial sensation is intact to light touch CN VII: Face is symmetric with normal eye closure, frequent right hemifacial spasm, mainly involving right orbicularis oculi, some right frontalis muscles CN VIII: Hearing is normal to causal conversation. CN IX, X: Phonation is normal. CN XI: Head turning and shoulder shrug are intact  MOTOR: There is no pronator drift of out-stretched arms. Muscle bulk and tone are normal. Muscle strength is normal.  REFLEXES: Reflexes are 2+ and symmetric at the biceps, triceps, knees, and ankles. Plantar responses are flexor.  SENSORY: Intact to light touch, pinprick and vibratory sensation are intact in fingers and toes.  COORDINATION: There is no trunk or limb dysmetria noted.  GAIT/STANCE: Posture is normal. Gait is steady    REVIEW OF SYSTEMS:  Full 14 system review of systems performed and notable only for as above All other review of systems were negative.   ALLERGIES: Allergies  Allergen Reactions   Azithromycin Nausea Only   Latex Rash   Spinach Hives    Cooked    Tape Dermatitis and Other (See Comments)    PLEASE  USE PAPER TAPE!! AFFECTS SKIN BADLY!!!!    HOME MEDICATIONS: Current Outpatient Medications  Medication Sig Dispense Refill   acetaminophen (TYLENOL) 500 MG tablet Take 500-1,000 mg by mouth as needed for headache.     apixaban (ELIQUIS) 5 MG TABS tablet TAKE ONE TABLET BY MOUTH TWICE A DAY 180 tablet 1   ascorbic acid (VITAMIN C) 500 MG tablet Take 500 mg by mouth 2 (two) times daily.     atorvastatin  (LIPITOR) 80 MG tablet TAKE ONE TABLET BY MOUTH ONE TIME DAILY (Patient taking differently: Take 80 mg by mouth at bedtime.) 90 tablet 3   dapagliflozin propanediol (FARXIGA) 10 MG TABS tablet Take 1 tablet (10 mg total) by mouth daily. 90 tablet 0   diphenhydrAMINE (BENADRYL) 25 MG tablet Take 25 mg by mouth as needed for allergies.     dronedarone (MULTAQ) 400 MG tablet Take 1 tablet (400 mg total) by mouth 2 (two) times daily with a meal. 180 tablet 3   ferrous sulfate (FEROSUL) 325 (65 FE) MG tablet TAKE ONE TABLET BY MOUTH ONE TIME DAILY WITH BREAKFAST 30 tablet 0   furosemide (LASIX) 40 MG tablet TAKE ONE TABLET BY MOUTH ONE TIME DAILY, TAKE AN EXTRA TABLET AT NIGHT AS NEEDED 45 tablet 11   methocarbamol (ROBAXIN) 500 MG tablet Take 2 tablets (1,000 mg total) by mouth every 8 (eight) hours as needed for muscle spasms. 90 tablet 0   metoprolol succinate (TOPROL-XL) 100 MG 24 hr tablet Take 1 tablet (100 mg total) by mouth daily. Take with or immediately following a meal. 90 tablet 3   sacubitril-valsartan (ENTRESTO) 24-26 MG Take 1 tablet by mouth 2 (two) times daily. 180 tablet 2   sodium chloride (OCEAN) 0.65 % SOLN nasal spray Place 1 spray into both nostrils daily as needed (for congestion).     vitamin B-12 (CYANOCOBALAMIN) 1000 MCG tablet Take 1 tablet (1,000 mcg total) by mouth daily.     pantoprazole (PROTONIX) 40 MG tablet Take 1 tablet (40 mg total) by mouth daily. No refills need, post procedure medication. 45 tablet 0   Current Facility-Administered Medications  Medication Dose Route Frequency Provider Last Rate Last Admin   incobotulinumtoxinA (XEOMIN) 50 units injection 50 Units  50 Units Intramuscular Q90 days Levert Feinstein, MD   50 Units at 11/30/16 1546   incobotulinumtoxinA (XEOMIN) 50 units injection 50 Units  50 Units Intramuscular Q90 days Levert Feinstein, MD   50 Units at 07/25/17 0843   incobotulinumtoxinA (XEOMIN) 50 units injection 50 Units  50 Units Intramuscular Q90 days Levert Feinstein, MD   50 Units at 07/26/17 1217   incobotulinumtoxinA (XEOMIN) 50 units injection 50 Units  50 Units Intramuscular Q90 days Levert Feinstein, MD   50 Units at 06/05/23 1138   incobotulinumtoxinA (XEOMIN) 50 units injection 50 Units  50 Units Intramuscular Q90 days Levert Feinstein, MD        PAST MEDICAL HISTORY: Past Medical History:  Diagnosis Date   Abdominal pain    ? chronic cholecystitis; admxn to [facility] 9/12 (CT, USN, HIDA done)   Atrial fibrillation (HCC)    Cardiomyopathy    echo 9/12: mild LVH, EF 35-40%, mod MR (difficult to judge /post leaflet restricted), mod BAE, mod RVE, mod TR    Cardiomyopathy (HCC)    a. echo 9/12: mild LVH, EF 35-40%, mod MR (difficult to judge /post leaflet restricted), mod BAE, mod RVE, mod TR. b. Echo 11/2011: mild LVH, EF 55-60%, s/p MV repair w/o sig  MR/MS, mildly dilated RV/RA, PASP . // AFL dx 10/2018 - Echo 10/2018: EF 30, diff HK, s/p MV repair with trivial MR, mean MV 7 but not c/w significant mitral stenosis, mild RAE, severe LAE, mod reduced RVSF    Carotid artery disease (HCC)    a. Duplex 06/2015: 50% RECA, 1-39% BICA.   CHF (congestive heart failure) (HCC)    Clotting disorder (HCC)    Coronary artery disease    a. s/p CABG 10/2011 - LIMA-LAD, SVG-PDA, SVG-OM, SVG-diagonal.   Dyslipidemia    Essential hypertension    Heart murmur    Hemifacial spasm    History of Doppler ultrasound    a. Carotid US [facility] in [place], [place]) 7/17: no hemodynamically significant ICA stenosis   Hyperlipidemia    Mitral regurgitation    a. s/p MV repair 10/2011 - on Coumadin for 3 months afterwards then d/c'd by surgery.   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 age 74 - in setting of cigs and OCPs. //  b. s/p acute L parietal CVA >> tPA - [facility] in [place], [place]   Stroke [facility]) 01/2017   Tobacco abuse     PAST SURGICAL HISTORY: Past  Surgical History:  Procedure Laterality Date   ATRIAL FIBRILLATION ABLATION N/A 08/07/2023   Procedure: ATRIAL FIBRILLATION ABLATION;  Surgeon: Lanier Prude, MD;  Location: [facility] INVASIVE CV LAB;  Service: Cardiovascular;  Laterality: N/A;   CARDIAC CATHETERIZATION     CARDIOVERSION N/A 01/25/2019   Procedure: CARDIOVERSION;  Surgeon: Chrystie Nose, MD;  Location: [facility] ENDOSCOPY;  Service: Cardiovascular;  Laterality: N/A;   CARDIOVERSION N/A 06/14/2019   Procedure: CARDIOVERSION;  Surgeon: Chrystie Nose, MD;  Location: [facility] ENDOSCOPY;  Service: Cardiovascular;  Laterality: N/A;   CESAREAN SECTION  1996   twins   CORONARY ARTERY BYPASS GRAFT  10/21/2011   Procedure: CORONARY ARTERY BYPASS GRAFTING (CABG);  Surgeon: Purcell Nails, MD;  Location: [facility] OR;  Service: Open Heart Surgery;  Laterality: N/A;  cabg x four, using right leg greater saphenous vein harvested endoscopically   DENTAL SURGERY     MITRAL VALVE REPAIR  10/21/2011   Procedure: MITRAL VALVE REPAIR (MVR);  Surgeon: Purcell Nails, MD;  Location: [facility] OR;  Service: Open Heart Surgery;  Laterality: N/A;   PACEMAKER IMPLANT N/A 06/25/2019   Procedure: PACEMAKER IMPLANT;  Surgeon: Hillis Range, MD;  Location: [facility] INVASIVE CV LAB;  Service: Cardiovascular;  Laterality: N/A;   TEE WITHOUT CARDIOVERSION  06/30/2011   Procedure: TRANSESOPHAGEAL ECHOCARDIOGRAM (TEE);  Surgeon: Pricilla Riffle, MD;  Location: [facility] ENDOSCOPY;  Service: Cardiovascular;  Laterality: N/A;   TEE WITHOUT CARDIOVERSION N/A 01/19/2022   Procedure: TRANSESOPHAGEAL ECHOCARDIOGRAM (TEE);  Surgeon: Sande Rives, MD;  Location: [facility] ENDOSCOPY;  Service: Cardiovascular;  Laterality: N/A;   TONSILLECTOMY     TUBAL LIGATION      FAMILY HISTORY: Family History  Problem Relation Age of Onset   Hypertension Mother    Stroke Mother    Heart disease Father    COPD Father    Bone cancer Maternal Grandmother    Anesthesia problems Neg Hx    Hypotension Neg Hx     Malignant hyperthermia Neg Hx    Pseudochol deficiency Neg Hx    Colon cancer Neg Hx     SOCIAL HISTORY: Social History  Socioeconomic History   Marital status: Widowed    Spouse name: Not on file   Number of children: 2   Years of education: Not on file   Highest education level: Bachelor's degree (e.g., BA, AB, BS)  Occupational History   Occupation: Payroll/Paperwork    Comment: Owns a Contractor.   Occupation: Disability    Comment: since march 2023  Tobacco Use   Smoking status: Former    Current packs/day: 0.00    Average packs/day: 1 pack/day for 30.0 years (30.0 ttl pk-yrs)    Types: Cigarettes    Start date: 10/1991    Quit date: 10/2021    Years since quitting: 1.9   Smokeless tobacco: Never  Vaping Use   Vaping status: Never Used  Substance and Sexual Activity   Alcohol use: Yes    Alcohol/week: 1.0 standard drink of alcohol    Types: 1 Cans of beer per week    Comment: can of beer 1 x week   Drug use: No   Sexual activity: Yes  Other Topics Concern   Not on file  Social History Narrative   Lives at home with husband and son.   Right-handed.   2 cups caffeine daily.   Social Drivers of Corporate investment banker Strain: Not on file  Food Insecurity: No Food Insecurity (02/17/2023)   Hunger Vital Sign    Worried About Running Out of Food in the Last Year: Never true    Ran Out of Food in the Last Year: Never true  Transportation Needs: No Transportation Needs (02/17/2023)   PRAPARE - Administrator, Civil Service (Medical): No    Lack of Transportation (Non-Medical): No  Physical Activity: Insufficiently Active (02/17/2023)   Exercise Vital Sign    Days of Exercise per Week: 4 days    Minutes of Exercise per Session: 30 min  Stress: No Stress Concern Present (02/17/2023)   [employer] of Occupational Health - Occupational Stress Questionnaire    Feeling of Stress : Only a little  Social Connections: Moderately  Isolated (02/17/2023)   Social Connection and Isolation Panel [NHANES]    Frequency of Communication with Friends and Family: More than three times a week    Frequency of Social Gatherings with Friends and Family: More than three times a week    Attends Religious Services: 1 to 4 times per year    Active Member of [employer] or Organizations: No    Attends Banker Meetings: Not on file    Marital Status: Widowed  Intimate Partner Violence: Not on file      Levert Feinstein, M.D. Ph.D.  [facility] [address], [address] [place], [place] [address] Ph: [phone] Fax: [phone]  CC:  Hoy Register, MD [address] [place] [address] [place],  [place] [address]  Hoy Register, MD

## 2023-10-18 NOTE — Progress Notes (Signed)
 xeomin 50 units x 1 vial  [id number] [id number] Exp-11/2025 B/B  Bacteriostatic 0.9% Sodium Chloride- 1 mL  OZD:[id number] Expiration: 06/22/2024 NDC: 4742-5956-38 Dx: G51.31 WITNESSED VF:IEPPI J RN

## 2023-10-25 DIAGNOSIS — G5131 Clonic hemifacial spasm, right: Secondary | ICD-10-CM | POA: Diagnosis not present

## 2023-10-30 NOTE — Progress Notes (Signed)
 Remote pacemaker transmission.

## 2023-11-01 ENCOUNTER — Telehealth: Payer: Self-pay | Admitting: Internal Medicine

## 2023-11-01 NOTE — Telephone Encounter (Signed)
 Pt called with concerns about her daughter. Her daughter was a pt of Graciela Husbands but has not seen him since 2022. Daughter is experiencing chest pain with activity such as walking around [place]. The caller, Selby Spahr, was told that we would need to speak with her daughter directly about her symptoms. Pt was told that we would give her daughter a call. Pt verbalized understanding. All questions, if any, were answered.

## 2023-11-01 NOTE — Telephone Encounter (Signed)
 Patient calling with some questions about her heart problems. Would like for the nurse to call her back. Please advise

## 2023-11-05 ENCOUNTER — Other Ambulatory Visit: Payer: Self-pay | Admitting: Physician Assistant

## 2023-11-05 DIAGNOSIS — I5042 Chronic combined systolic (congestive) and diastolic (congestive) heart failure: Secondary | ICD-10-CM

## 2023-11-06 ENCOUNTER — Encounter: Payer: Self-pay | Admitting: Family Medicine

## 2023-11-06 ENCOUNTER — Ambulatory Visit: Payer: Medicaid Other | Attending: Family Medicine | Admitting: Family Medicine

## 2023-11-06 VITALS — BP 142/80 | HR 60 | Ht 68.0 in | Wt 191.6 lb

## 2023-11-06 DIAGNOSIS — I5042 Chronic combined systolic (congestive) and diastolic (congestive) heart failure: Secondary | ICD-10-CM | POA: Diagnosis not present

## 2023-11-06 DIAGNOSIS — I4819 Other persistent atrial fibrillation: Secondary | ICD-10-CM

## 2023-11-06 DIAGNOSIS — G5131 Clonic hemifacial spasm, right: Secondary | ICD-10-CM

## 2023-11-06 DIAGNOSIS — Z1211 Encounter for screening for malignant neoplasm of colon: Secondary | ICD-10-CM | POA: Diagnosis not present

## 2023-11-06 DIAGNOSIS — I10 Essential (primary) hypertension: Secondary | ICD-10-CM | POA: Diagnosis not present

## 2023-11-06 DIAGNOSIS — J329 Chronic sinusitis, unspecified: Secondary | ICD-10-CM

## 2023-11-06 MED ORDER — FERROUS SULFATE 325 (65 FE) MG PO TABS: 325.0000 mg | ORAL_TABLET | Freq: Two times a day (BID) | ORAL | 1 refills | Status: AC

## 2023-11-06 MED ORDER — DAPAGLIFLOZIN PROPANEDIOL 10 MG PO TABS: 10.0000 mg | ORAL_TABLET | Freq: Every day | ORAL | 1 refills | Status: DC

## 2023-11-06 MED ORDER — AMOXICILLIN-POT CLAVULANATE 875-125 MG PO TABS: 1.0000 | ORAL_TABLET | Freq: Two times a day (BID) | ORAL | 0 refills | Status: AC

## 2023-11-06 NOTE — Progress Notes (Signed)
 Subjective:  Patient ID: Ashley [name], female    DOB: 1965/11/05  Age: 58 y.o. MRN: [id number]  CC: Medical Management of Chronic Issues (Medication refill/Referral for therapy/)     Discussed the use of AI scribe software for clinical note transcription with the patient, who gave verbal consent to proceed.  History of Present Illness The patient, with a history of hypertension,previous CVA, CAD status post CABG, MVP s/p mitral valve repair, atrial fibrillation failed Tikosyn treatment due to prolonged QTC, DCCV in10/2020 with period of asystole, junctional rhythm in the 20s which was treated with atropine and ephedrine and dopamine for hypotension, sinus node dysfunction status post pacemaker placement in 06/2019, status post ablation in 07/2023 presents for medication refills.  She reports that her blood pressure is elevated due to 'white coat syndrome.' She underwent a cardiac ablation for atrial fibrillation in December and has been 'doing fine' since. She is scheduled for a follow-up with cardiology later this month, at which point she may be taken off Multaq if she remains in sinus rhythm.  The patient is also experiencing stress related to her daughter's health, who has been having chest pains. She is accompanying her daughter to a second opinion appointment at a different clinic in April.  The patient has been having issues with medication refills, particularly with Marcelline Deist, which was initially prescribed by another provider but has been managed by this clinic. There was confusion between the two clinics about who should be refilling the medication, but the issue has been resolved and the Marcelline Deist will be refilled by this clinic. She is good on her cardiac medications. 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  The patient also reports a sinus infection with symptoms of sneezing, pressure in the sinuses, and headaches.  She has been using a nasal spray, but it has not been effective.    Past Medical History:  Diagnosis Date   Abdominal pain    ? chronic cholecystitis; admxn to [facility] 9/12 (CT, USN, HIDA done)   Atrial fibrillation (HCC)    Cardiomyopathy    echo 9/12: mild LVH, EF 35-40%, mod MR (difficult to judge /post leaflet restricted), mod BAE, mod RVE, mod TR    Cardiomyopathy (HCC)    a. echo 9/12: mild LVH, EF 35-40%, mod MR (difficult to judge /post leaflet restricted), mod BAE, mod RVE, mod TR. b. Echo 11/2011: mild LVH, EF 55-60%, s/p MV repair w/o sig MR/MS, mildly dilated RV/RA, PASP . // AFL dx 10/2018 - Echo 10/2018: EF 30, diff HK, s/p MV repair with trivial MR, mean MV 7 but not c/w significant mitral stenosis, mild RAE, severe LAE, mod reduced RVSF    Carotid artery disease (HCC)    a. Duplex 06/2015: 50% RECA, 1-39% BICA.   CHF (congestive heart failure) (HCC)    Clotting disorder (HCC)    Coronary artery disease    a. s/p CABG 10/2011 - LIMA-LAD, SVG-PDA, SVG-OM, SVG-diagonal.   Dyslipidemia    Essential hypertension    Heart murmur    Hemifacial spasm    History of Doppler ultrasound    a. Carotid US [facility] in [place], [place]) 7/17: no hemodynamically significant ICA stenosis   Hyperlipidemia    Mitral regurgitation    a. s/p MV repair 10/2011 - on Coumadin for 3 months afterwards then d/c'd by surgery.   Recurrent upper respiratory infection (URI)    S/P CABG x 4  10/21/2011   S/P mitral valve repair 10/21/2011   Stroke [facility]) 1989   a. age 70 - in setting of cigs and OCPs. //  b. s/p acute L parietal CVA >> tPA - [facility] in [place], [place]   Stroke [facility]) 01/2017   Tobacco abuse     Past Surgical History:  Procedure Laterality Date   ATRIAL FIBRILLATION ABLATION N/A 08/07/2023   Procedure: ATRIAL FIBRILLATION ABLATION;  Surgeon: Lanier Prude, MD;  [facility]: [facility] INVASIVE CV LAB;  Service: Cardiovascular;  Laterality: N/A;   CARDIAC CATHETERIZATION      CARDIOVERSION N/A 01/25/2019   Procedure: CARDIOVERSION;  Surgeon: Chrystie Nose, MD;  Location: [facility] ENDOSCOPY;  Service: Cardiovascular;  Laterality: N/A;   CARDIOVERSION N/A 06/14/2019   Procedure: CARDIOVERSION;  Surgeon: Chrystie Nose, MD;  Location: [facility] ENDOSCOPY;  Service: Cardiovascular;  Laterality: N/A;   CESAREAN SECTION  1996   twins   CORONARY ARTERY BYPASS GRAFT  10/21/2011   Procedure: CORONARY ARTERY BYPASS GRAFTING (CABG);  Surgeon: Purcell Nails, MD;  Location: [facility] OR;  Service: Open Heart Surgery;  Laterality: N/A;  cabg x four, using right leg greater saphenous vein harvested endoscopically   DENTAL SURGERY     MITRAL VALVE REPAIR  10/21/2011   Procedure: MITRAL VALVE REPAIR (MVR);  Surgeon: Purcell Nails, MD;  Location: [facility] OR;  Service: Open Heart Surgery;  Laterality: N/A;   PACEMAKER IMPLANT N/A 06/25/2019   Procedure: PACEMAKER IMPLANT;  Surgeon: Hillis Range, MD;  Location: [facility] INVASIVE CV LAB;  Service: Cardiovascular;  Laterality: N/A;   TEE WITHOUT CARDIOVERSION  06/30/2011   Procedure: TRANSESOPHAGEAL ECHOCARDIOGRAM (TEE);  Surgeon: Pricilla Riffle, MD;  Location: [facility] ENDOSCOPY;  Service: Cardiovascular;  Laterality: N/A;   TEE WITHOUT CARDIOVERSION N/A 01/19/2022   Procedure: TRANSESOPHAGEAL ECHOCARDIOGRAM (TEE);  Surgeon: Sande Rives, MD;  Location: [facility] ENDOSCOPY;  Service: Cardiovascular;  Laterality: N/A;   TONSILLECTOMY     TUBAL LIGATION      Family History  Problem Relation Age of Onset   Hypertension Mother    Stroke Mother    Heart disease Father    COPD Father    Bone cancer Maternal Grandmother    Anesthesia problems Neg Hx    Hypotension Neg Hx    Malignant hyperthermia Neg Hx    Pseudochol deficiency Neg Hx    Colon cancer Neg Hx     Social History   Socioeconomic History   Marital status: Widowed    Spouse name: Not on file   Number of children: 2   Years of education: Not on file   Highest education level: Bachelor's  degree (e.g., BA, AB, BS)  Occupational History   Occupation: Payroll/Paperwork    Comment: Owns a Contractor.   Occupation: Disability    Comment: since march 2023  Tobacco Use   Smoking status: Former    Current packs/day: 0.00    Average packs/day: 1 pack/day for 30.0 years (30.0 ttl pk-yrs)    Types: Cigarettes    Start date: 10/1991    Quit date: 10/2021    Years since quitting: 2.0   Smokeless tobacco: Never  Vaping Use   Vaping status: Never Used  Substance and Sexual Activity   Alcohol use: Yes    Alcohol/week: 1.0 standard drink of alcohol    Types: 1 Cans of beer per week    Comment: can of beer 1 x week   Drug use: No  Sexual activity: Yes  Other Topics Concern   Not on file  Social History Narrative   Lives at home with husband and son.   Right-handed.   2 cups caffeine daily.   Social Drivers of Health   Financial Resource Strain: Medium Risk (11/03/2023)   Overall Financial Resource Strain (CARDIA)    Difficulty of Paying Living Expenses: Somewhat hard  Food Insecurity: Food Insecurity Present (11/03/2023)   Hunger Vital Sign    Worried About Running Out of Food in the Last Year: Sometimes true    Ran Out of Food in the Last Year: Patient declined  Transportation Needs: Unmet Transportation Needs (11/03/2023)   PRAPARE - Administrator, Civil Service (Medical): Yes    Lack of Transportation (Non-Medical): Yes  Physical Activity: Insufficiently Active (11/03/2023)   Exercise Vital Sign    Days of Exercise per Week: 2 days    Minutes of Exercise per Session: 30 min  Stress: Stress Concern Present (11/03/2023)   [employer] of Occupational Health - Occupational Stress Questionnaire    Feeling of Stress : To some extent  Social Connections: Moderately Isolated (11/03/2023)   Social Connection and Isolation Panel [NHANES]    Frequency of Communication with Friends and Family: More than three times a week    Frequency of  Social Gatherings with Friends and Family: Twice a week    Attends Religious Services: Never    Database administrator or Organizations: Yes    Attends Engineer, structural: More than 4 times per year    Marital Status: Widowed    Allergies  Allergen Reactions   Azithromycin Nausea Only   Latex Rash   Spinach Hives    Cooked    Tape Dermatitis and Other (See Comments)    PLEASE USE PAPER TAPE!! AFFECTS SKIN BADLY!!!!    Outpatient Medications Prior to Visit  Medication Sig Dispense Refill   acetaminophen (TYLENOL) 500 MG tablet Take 500-1,000 mg by mouth as needed for headache.     apixaban (ELIQUIS) 5 MG TABS tablet TAKE ONE TABLET BY MOUTH TWICE A DAY 180 tablet 1   ascorbic acid (VITAMIN C) 500 MG tablet Take 500 mg by mouth 2 (two) times daily.     atorvastatin (LIPITOR) 80 MG tablet TAKE ONE TABLET BY MOUTH ONE TIME DAILY (Patient taking differently: Take 80 mg by mouth at bedtime.) 90 tablet 3   diphenhydrAMINE (BENADRYL) 25 MG tablet Take 25 mg by mouth as needed for allergies.     dronedarone (MULTAQ) 400 MG tablet Take 1 tablet (400 mg total) by mouth 2 (two) times daily with a meal. 180 tablet 3   furosemide (LASIX) 40 MG tablet TAKE ONE TABLET BY MOUTH ONE TIME DAILY, TAKE AN EXTRA TABLET AT NIGHT AS NEEDED 45 tablet 11   methocarbamol (ROBAXIN) 500 MG tablet Take 2 tablets (1,000 mg total) by mouth every 8 (eight) hours as needed for muscle spasms. 90 tablet 0   metoprolol succinate (TOPROL-XL) 100 MG 24 hr tablet Take 1 tablet (100 mg total) by mouth daily. Take with or immediately following a meal. 90 tablet 3   sacubitril-valsartan (ENTRESTO) 24-26 MG Take 1 tablet by mouth 2 (two) times daily. 180 tablet 2   sodium chloride (OCEAN) 0.65 % SOLN nasal spray Place 1 spray into both nostrils daily as needed (for congestion).     vitamin B-12 (CYANOCOBALAMIN) 1000 MCG tablet Take 1 tablet (1,000 mcg total) by mouth daily.  dapagliflozin propanediol (FARXIGA) 10  MG TABS tablet Take 1 tablet (10 mg total) by mouth daily. 90 tablet 0   ferrous sulfate (FEROSUL) 325 (65 FE) MG tablet TAKE ONE TABLET BY MOUTH ONE TIME DAILY WITH BREAKFAST 30 tablet 0   pantoprazole (PROTONIX) 40 MG tablet Take 1 tablet (40 mg total) by mouth daily. No refills need, post procedure medication. 45 tablet 0   Facility-Administered Medications Prior to Visit  Medication Dose Route Frequency Provider Last Rate Last Admin   incobotulinumtoxinA (XEOMIN) 50 units injection 50 Units  50 Units Intramuscular Q90 days Levert Feinstein, MD   50 Units at 11/30/16 1546   incobotulinumtoxinA (XEOMIN) 50 units injection 50 Units  50 Units Intramuscular Q90 days Levert Feinstein, MD   50 Units at 07/25/17 0843   incobotulinumtoxinA (XEOMIN) 50 units injection 50 Units  50 Units Intramuscular Q90 days Levert Feinstein, MD   50 Units at 07/26/17 1217   incobotulinumtoxinA (XEOMIN) 50 units injection 50 Units  50 Units Intramuscular Q90 days Levert Feinstein, MD   50 Units at 06/05/23 1138   incobotulinumtoxinA (XEOMIN) 50 units injection 50 Units  50 Units Intramuscular Q90 days Levert Feinstein, MD   50 Units at 10/25/23 2259     ROS Review of Systems  Constitutional:  Negative for activity change and appetite change.  HENT:  Positive for sinus pressure and sneezing. Negative for sore throat.   Respiratory:  Negative for chest tightness, shortness of breath and wheezing.   Cardiovascular:  Negative for chest pain and palpitations.  Gastrointestinal:  Negative for abdominal distention, abdominal pain and constipation.  Genitourinary: Negative.   Musculoskeletal: Negative.   Neurological:  Positive for headaches.  Psychiatric/Behavioral:  Negative for behavioral problems and dysphoric mood.     Objective:  BP (!) 142/80   Pulse 60   Ht 5\' 8"  (1.727 m)   Wt 191 lb 9.6 oz (86.9 kg)   LMP 05/18/2015 Comment: went for 3 week after skipping two months   SpO2 98%   BMI 29.13 kg/m      11/06/2023    2:28 PM  11/06/2023    1:58 PM 10/18/2023   10:49 AM  BP/Weight  Systolic BP 142 148 142  Diastolic BP 80 81 75  Wt. (Lbs)  191.6   BMI  29.13 kg/m2       Physical Exam Constitutional:      Appearance: She is well-developed.  Cardiovascular:     Rate and Rhythm: Normal rate.     Heart sounds: Normal heart sounds. No murmur heard. Pulmonary:     Effort: Pulmonary effort is normal.     Breath sounds: Normal breath sounds. No wheezing or rales.  Chest:     Chest wall: No tenderness.  Abdominal:     General: Bowel sounds are normal. There is no distension.     Palpations: Abdomen is soft. There is no mass.     Tenderness: There is no abdominal tenderness.  Musculoskeletal:        General: Normal range of motion.     Right lower leg: No edema.     Left lower leg: No edema.  Neurological:     Mental Status: She is alert and oriented to person, place, and time. Mental status is at baseline.     Comments: Right hemifacial spasms  Psychiatric:        Mood and Affect: Mood normal.        Latest Ref Rng & Units 07/19/2023  10:53 AM 11/15/2022   11:29 AM 05/18/2022   10:49 AM  CMP  Glucose 70 - 99 mg/dL 782  68  956   BUN 6 - 24 mg/dL 16  35  21   Creatinine 0.57 - 1.00 mg/dL 2.13  0.86  5.78   Sodium 134 - 144 mmol/L 140  140  142   Potassium 3.5 - 5.2 mmol/L 4.8  4.8  4.8   Chloride 96 - 106 mmol/L 104  101  101   CO2 20 - 29 mmol/L 24  28  30    Calcium 8.7 - 10.2 mg/dL 9.1  9.4  9.7   Total Protein 6.5 - 8.1 g/dL   7.2   Total Bilirubin 0.3 - 1.2 mg/dL   1.4   Alkaline Phos 38 - 126 U/L   120   AST 15 - 41 U/L   24   ALT 0 - 44 U/L   28     Lipid Panel     Component Value Date/Time   CHOL 131 08/29/2022 0845   TRIG 83 08/29/2022 0845   HDL 47 08/29/2022 0845   CHOLHDL 2.8 08/29/2022 0845   CHOLHDL 3.8 07/01/2016 0939   VLDL 25 07/01/2016 0939   LDLCALC 68 08/29/2022 0845    CBC    Component Value Date/Time   WBC 5.5 07/19/2023 1053   WBC 5.2 05/18/2022 1049    RBC 4.03 07/19/2023 1053   RBC 4.38 05/18/2022 1049   HGB 13.4 07/19/2023 1053   HCT 40.7 07/19/2023 1053   PLT 225 07/19/2023 1053   MCV 101 (H) 07/19/2023 1053   MCH 33.3 (H) 07/19/2023 1053   MCH 32.4 05/18/2022 1049   MCHC 32.9 07/19/2023 1053   MCHC 33.2 05/18/2022 1049   RDW 12.9 07/19/2023 1053   LYMPHSABS 1.0 02/24/2022 2110   MONOABS 1.4 (H) 02/24/2022 2110   EOSABS 0.1 02/24/2022 2110   BASOSABS 0.0 02/24/2022 2110    Lab Results  Component Value Date   HGBA1C 5.1 03/16/2020       Assessment & Plan Sinusitis Persistent symptoms unrelieved by nasal spray. - Prescribe Augmentin.   Hypertension Elevated blood pressure, possibly due to white coat syndrome. - Recheck blood pressure before leaving the office still slightly elevated -No regimen change today but will continue lifestyle modification and current regimen  Atrial Fibrillation Post-ablation, feeling fine. Follow-up scheduled to consider Multaq discontinuation if sinus rhythm maintained. - Continue follow-up with AFib clinic and cardiologist. - Evaluate potential discontinuation of Multaq at next cardiology appointment. -Continue Eliquis, metoprolol  Iron Deficiency Ran out of iron pills, previous prescription managed here. - Refill prescription for iron pills.   CHF/CAD -Euvolemic; EF of 44% from nuclear stress test of 10/2022 -Continue Entresto, beta-blocker, Lasix, SGLT2i -Follow-up with cardiology   Mental Health Support Expressed need for mental health support, inquired about Medicaid-covered therapists. - Provide contact information for a recommended therapist and additional resources.  General Health Maintenance Eligible for colonoscopy and lung cancer screening. Declined pneumonia vaccine due to past reactions and skepticism. - Order Cologuard for colon cancer screening. - Encourage scheduling of lung cancer screening. - Discuss risks and benefits of pneumonia vaccine; declined  vaccination.      Meds ordered this encounter  Medications   dapagliflozin propanediol (FARXIGA) 10 MG TABS tablet    Sig: Take 1 tablet (10 mg total) by mouth daily.    Dispense:  90 tablet    Refill:  1   amoxicillin-clavulanate (AUGMENTIN) 875-125 MG  tablet    Sig: Take 1 tablet by mouth 2 (two) times daily.    Dispense:  20 tablet    Refill:  0   ferrous sulfate (FEROSUL) 325 (65 FE) MG tablet    Sig: Take 1 tablet (325 mg total) by mouth 2 (two) times daily with a meal.    Dispense:  180 tablet    Refill:  1    Follow-up: Return in about 6 months (around 05/08/2024) for Chronic medical conditions.       Hoy Register, MD, FAAFP. [facility] [place], [place] [phone]   11/06/2023, 6:17 PM

## 2023-11-06 NOTE — Patient Instructions (Addendum)
 VISIT SUMMARY:  During today's visit, we addressed your medication refills, blood pressure concerns, sinus infection, and mental health support. We also discussed your recent cardiac ablation and upcoming follow-up with your cardiologist. Additionally, we reviewed your eligibility for certain health screenings and vaccinations.  YOUR PLAN:  -SINUSITIS: Sinusitis is an inflammation or swelling of the tissue lining the sinuses, often causing symptoms like sneezing, sinus pressure, and headaches. We have prescribed Augmentin to help clear the infection. Please review your current medications to ensure there are no contraindications.  -HYPERTENSION: Hypertension, or high blood pressure, can sometimes be elevated in a medical setting due to anxiety, known as 'white coat syndrome.' We will recheck your blood pressure before you leave the office today.  -ATRIAL FIBRILLATION: Atrial fibrillation is an irregular and often rapid heart rate that can increase the risk of strokes, heart failure, and other heart-related complications. Since your cardiac ablation in December, you have been doing well. Continue your follow-up with the AFib clinic and your cardiologist, who will evaluate the potential discontinuation of Multaq at your next appointment.  -IRON DEFICIENCY: Iron deficiency occurs when your body doesn't have enough iron, leading to fatigue and other symptoms. We will refill your prescription for iron pills to help manage this condition.  -MENTAL HEALTH SUPPORT: You expressed a need for mental health support due to stress related to your daughter's health. We have provided contact information for a recommended therapist and additional resources covered by Medicaid.  -GENERAL HEALTH MAINTENANCE: You are eligible for colonoscopy and lung cancer screening. We have ordered a Cologuard test for colon cancer screening and encourage you to schedule a lung cancer screening. We discussed the risks and benefits of  the pneumonia vaccine, but you have declined it due to past reactions and skepticism.  INSTRUCTIONS:  Please follow up with your cardiologist as scheduled later this month to discuss the potential discontinuation of Multaq. Recheck your blood pressure before leaving the office today. Ensure you take the prescribed Augmentin for your sinusitis and review your current medications for any contraindications. Schedule your lung cancer screening and complete the Cologuard test for colon cancer screening. Contact the recommended therapist for mental health support.

## 2023-11-07 ENCOUNTER — Telehealth: Payer: Self-pay | Admitting: Internal Medicine

## 2023-11-07 ENCOUNTER — Other Ambulatory Visit (HOSPITAL_COMMUNITY): Payer: Self-pay

## 2023-11-07 ENCOUNTER — Other Ambulatory Visit: Payer: Self-pay | Admitting: Emergency Medicine

## 2023-11-07 DIAGNOSIS — I5042 Chronic combined systolic (congestive) and diastolic (congestive) heart failure: Secondary | ICD-10-CM

## 2023-11-07 MED ORDER — ENTRESTO 24-26 MG PO TABS
1.0000 | ORAL_TABLET | Freq: Two times a day (BID) | ORAL | 2 refills | Status: DC
  Filled 2023-11-07: qty 180, 90d supply, fill #0

## 2023-11-07 MED ORDER — ENTRESTO 24-26 MG PO TABS: 1.0000 | ORAL_TABLET | Freq: Two times a day (BID) | ORAL | 2 refills | Status: DC

## 2023-11-07 NOTE — Telephone Encounter (Signed)
 This is a Educational psychologist pt

## 2023-11-07 NOTE — Addendum Note (Signed)
 Addended by: Jani Gravel on: 11/07/2023 04:54 PM   Modules accepted: Orders

## 2023-11-07 NOTE — Telephone Encounter (Signed)
*  STAT* If patient is at the pharmacy, call can be transferred to refill team.   1. Which medications need to be refilled? (please list name of each medication and dose if known) apixaban (ELIQUIS) 5 MG TABS tablet   2. Which pharmacy/location (including street and city if local pharmacy) is medication to be sent to? [facility] [address] [place], [place] - [address] AT [facility] [address]   3. Do they need a 30 day or 90 day supply? 90   Patient needs medication for in the morning

## 2023-11-07 NOTE — Telephone Encounter (Signed)
Requested refill sent to preferred pharmacy

## 2023-11-07 NOTE — Telephone Encounter (Signed)
*  STAT* If patient is at the pharmacy, call can be transferred to refill team.   1. Which medications need to be refilled? (please list name of each medication and dose if known)   sacubitril-valsartan (ENTRESTO) 24-26 MG    2. Which pharmacy/location (including street and city if local pharmacy) is medication to be sent to?  [facility] [address] [place], [place] - [address] AT [facility] [address]      3. Do they need a 30 day or 90 day supply? 90 day    Pt is out of medication

## 2023-11-08 MED ORDER — APIXABAN 5 MG PO TABS: 5.0000 mg | ORAL_TABLET | Freq: Two times a day (BID) | ORAL | 1 refills | Status: DC

## 2023-11-08 NOTE — Telephone Encounter (Signed)
 Prescription refill request for Eliquis received. Indication: AF Last office visit: 09/04/23  Tempie Donning PA-C Scr: 1.21 on 07/19/23 Age: 58 Weight: 85.4kg  Based on above findings Eliquis 5mg  twice daily is the appropriate dose.  Refill approved.

## 2023-11-09 NOTE — Progress Notes (Unsigned)
 Electrophysiology Office Note:   Date:  11/10/2023  ID:  Ernest Mallick Hilyard, DOB May 28, 1966, MRN [id number]  Primary Cardiologist: Dietrich Pates, MD Primary Heart Failure: None Electrophysiologist: Sherryl Manges, MD      History of Present Illness:   Ashley [name] is a 58 y.o. 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   Since last being seen in our clinic the patient reports doing better since her ablation.  At first she had a few bad weeks in regards to fatigue.  She feels like she has much more energy now.  She relays that her 76 y/o daughter is coming to be seen for chest pain.   She denies chest pain, palpitations, dyspnea, PND, orthopnea, nausea, vomiting, dizziness, syncope, edema, weight gain, or early satiety.   Review of systems complete and found to be negative unless listed in HPI.   EP Information / Studies Reviewed:    EKG is ordered today. Personal review as below.  EKG Interpretation Date/Time:  Friday November 10 2023 13:21:47 EDT Ventricular Rate:  61 PR Interval:  186 QRS Duration:  116 QT Interval:  450 QTC Calculation: 453 R Axis:   53  Text Interpretation: AP / Normal sinus rhythm Incomplete right bundle branch block Confirmed by Canary Brim ([id number]) on 11/10/2023 1:27:53 PM   PPM Interrogation-  reviewed in detail today,  See PACEART report.  Device History: Abbott Dual Chamber PPM implanted 06/25/2019 for Sinus Node Dysfunction  Studies:  ECHO 04/2022 > LVEF 40%, LA mod dilated  Zio 07/04/2019 > 6.5 second pause / asystole   EPS 08/07/23 > successful PVI, successful ablation / isolation of the posterior wall, success ablation of atypical atrial flutter   Arrhythmia / AAD AF  AFL > 10/2018 associated with LV dysfunction  DCCV: 01/2019 with ERAF, 05/2019 had a 10 second pause followed by junctional rhythm requiring atropine / epi Digoxin +  Diltiazem > stopped due to profound brady / junctional rhythm Tikosyn > failed with QT prolongation on full dose, then had breakthrough on lower doses Amiodarone > initiated 05/06/2019 Multaq    Risk Assessment/Calculations:    CHA2DS2-VASc Score = 6   This indicates a 9.7% annual risk of stroke. The patient's score is based upon: CHF History: 1 HTN History: 1 Diabetes History: 0 Stroke History: 2 Vascular Disease History: 1 Age Score: 0 Gender Score: 1             Physical Exam:   VS:  BP 122/68   Pulse 61   Ht 5\' 8"  (1.727 m)   Wt 191 lb 3.2 oz (86.7 kg)   LMP 05/18/2015 Comment: went for 3 week after skipping two months   SpO2 98%   BMI 29.07 kg/m    Wt Readings from Last 3 Encounters:  11/10/23 191 lb 3.2 oz (86.7 kg)  11/06/23 191 lb 9.6 oz (86.9 kg)  09/04/23 188 lb 3.2 oz (85.4 kg)     GEN: Well nourished, well developed in no acute distress NECK: No JVD; No carotid bruits CARDIAC: Regular rate and rhythm, no murmurs, rubs, gallops RESPIRATORY:  Clear to auscultation without rales, wheezing or rhonchi  ABDOMEN: Soft, non-tender, non-distended EXTREMITIES:  No edema; No deformity   ASSESSMENT AND PLAN:    SND s/p Abbott PPM  -Normal PPM function -See Pace Art report -No changes today -few episodes of seconds long AT, no AF since  ablation   Persistent Atrial Fibrillation  CHA2DS2-VASc 6, s/p ablation  -OAC for stroke prophylaxis  -stop dronedarone post ablation, reviewed with patient to hold onto medication but put it away -Toprol 100 mg daily  -will follow up in 3 months to review AF burden given stopping dronedarone  Secondary Hypercoagulable State  -continue Eliquis 5mg  BID, dose reviewed and appropriate by age / wt   HFrEF  -euvolemic on exam  -GDMT per Advanced HF Team    CAD s/p CABG  MVP with Mitral Repair 2013 -no anginal symptoms  -per primary Cardiology   Hypertension  -well controlled on current regimen     Disposition:    Follow up with EP APP in 3 months  Signed, Canary Brim, NP-C, AGACNP-BC [facility] - Electrophysiology  11/10/2023, 1:57 PM

## 2023-11-10 ENCOUNTER — Ambulatory Visit: Payer: Medicaid Other | Attending: Pulmonary Disease | Admitting: Pulmonary Disease

## 2023-11-10 ENCOUNTER — Encounter: Payer: Self-pay | Admitting: Pulmonary Disease

## 2023-11-10 VITALS — BP 122/68 | HR 61 | Ht 68.0 in | Wt 191.2 lb

## 2023-11-10 DIAGNOSIS — I5042 Chronic combined systolic (congestive) and diastolic (congestive) heart failure: Secondary | ICD-10-CM | POA: Diagnosis not present

## 2023-11-10 DIAGNOSIS — I495 Sick sinus syndrome: Secondary | ICD-10-CM | POA: Diagnosis not present

## 2023-11-10 DIAGNOSIS — Z950 Presence of cardiac pacemaker: Secondary | ICD-10-CM | POA: Diagnosis not present

## 2023-11-10 DIAGNOSIS — I4819 Other persistent atrial fibrillation: Secondary | ICD-10-CM

## 2023-11-10 DIAGNOSIS — Z951 Presence of aortocoronary bypass graft: Secondary | ICD-10-CM

## 2023-11-10 DIAGNOSIS — D6869 Other thrombophilia: Secondary | ICD-10-CM

## 2023-11-10 DIAGNOSIS — I10 Essential (primary) hypertension: Secondary | ICD-10-CM

## 2023-11-10 NOTE — Patient Instructions (Addendum)
 Medication Instructions:  STOP multaq *If you need a refill on your cardiac medications before your next appointment, please call your pharmacy*  Lab Work: None ordered If you have labs (blood work) drawn today and your tests are completely normal, you will receive your results only by: MyChart Message (if you have MyChart) OR A paper copy in the mail If you have any lab test that is abnormal or we need to change your treatment, we will call you to review the results.  Follow-Up: At [facility], you and your health needs are our priority.  As part of our continuing mission to provide you with exceptional heart care, we have created designated Provider Care Teams.  These Care Teams include your primary Cardiologist (physician) and Advanced Practice Providers (APPs -  Physician Assistants and Nurse Practitioners) who all work together to provide you with the care you need, when you need it.  Your next appointment:   3 month(s)  Provider:   Canary Brim, NP

## 2023-11-26 ENCOUNTER — Emergency Department (HOSPITAL_COMMUNITY)
Admission: EM | Admit: 2023-11-26 | Discharge: 2023-11-26 | Disposition: A | Discharge status: Home / Self Care | Attending: Emergency Medicine | Admitting: Emergency Medicine

## 2023-11-26 ENCOUNTER — Encounter (HOSPITAL_COMMUNITY): Payer: Self-pay | Admitting: *Deleted

## 2023-11-26 ENCOUNTER — Other Ambulatory Visit: Payer: Self-pay

## 2023-11-26 DIAGNOSIS — Z91040 Latex allergy status: Secondary | ICD-10-CM | POA: Insufficient documentation

## 2023-11-26 DIAGNOSIS — W57XXXA Bitten or stung by nonvenomous insect and other nonvenomous arthropods, initial encounter: Secondary | ICD-10-CM | POA: Diagnosis not present

## 2023-11-26 DIAGNOSIS — L03115 Cellulitis of right lower limb: Secondary | ICD-10-CM | POA: Insufficient documentation

## 2023-11-26 DIAGNOSIS — Z7901 Long term (current) use of anticoagulants: Secondary | ICD-10-CM | POA: Insufficient documentation

## 2023-11-26 DIAGNOSIS — S80261A Insect bite (nonvenomous), right knee, initial encounter: Secondary | ICD-10-CM | POA: Insufficient documentation

## 2023-11-26 MED ORDER — DOXYCYCLINE HYCLATE 100 MG PO CAPS: 100.0000 mg | ORAL_CAPSULE | Freq: Two times a day (BID) | ORAL | 0 refills | Status: AC

## 2023-11-26 MED ORDER — FLUCONAZOLE 150 MG PO TABS: 150.0000 mg | ORAL_TABLET | Freq: Once | ORAL | 0 refills | Status: AC

## 2023-11-26 NOTE — Discharge Instructions (Addendum)
 Please follow-up with your primary care provider to ensure resolution, return for any severe worsening symptoms.  Can take Benadryl for itching.  Doxycycline has been prescribed.  Please return to the emergency department if you develop worsening symptoms despite outpatient antibiotics

## 2023-11-26 NOTE — ED Triage Notes (Signed)
 The pt was wsitting outside last night when something bit her on the rt knee today the knee is sl swollen and red some from sunburn

## 2023-11-26 NOTE — ED Provider Notes (Signed)
 [place] EMERGENCY DEPARTMENT AT [facility] Provider Note   CSN: [id number] Arrival date & time: 11/26/23  1941     History  Chief Complaint  Patient presents with   Insect Bite    Ashley [name] is a 58 y.o. female.  HPI   59 year old female presenting to the emergency department with chief complaint of insect bite and redness and swelling.  The patient states that she was bit by something yesterday.  She since developed redness swelling and itchiness.  She has sunburn on her upper shoulders but does not have bilateral redness on her knees.  She feels slight pain and discomfort associated with swelling.  She had previously been on Augmentin for a sinus infection and is completing 2 more doses.  She developed this redness and swelling despite this medication.  Home Medications Prior to Admission medications   Medication Sig Start Date End Date Taking? Authorizing Provider  doxycycline (VIBRAMYCIN) 100 MG capsule Take 1 capsule (100 mg total) by mouth 2 (two) times daily for 7 days. 11/26/23 12/03/23 Yes Ernie Avena, MD  fluconazole (DIFLUCAN) 150 MG tablet Take 1 tablet (150 mg total) by mouth once for 1 dose. 11/26/23 11/26/23 Yes Ernie Avena, MD  acetaminophen (TYLENOL) 500 MG tablet Take 500-1,000 mg by mouth as needed for headache.    [provider]  amoxicillin-clavulanate (AUGMENTIN) 875-125 MG tablet Take 1 tablet by mouth 2 (two) times daily. 11/06/23   Hoy Register, MD  apixaban (ELIQUIS) 5 MG TABS tablet Take 1 tablet (5 mg total) by mouth 2 (two) times daily. 11/08/23   Pricilla Riffle, MD  ascorbic acid (VITAMIN C) 500 MG tablet Take 500 mg by mouth 2 (two) times daily.    [provider]  atorvastatin (LIPITOR) 80 MG tablet TAKE ONE TABLET BY MOUTH ONE TIME DAILY Patient taking differently: Take 80 mg by mouth at bedtime. 04/11/23   Duke Salvia, MD  dapagliflozin propanediol (FARXIGA) 10 MG TABS tablet Take 1 tablet (10 mg total) by  mouth daily. 11/06/23   Hoy Register, MD  diphenhydrAMINE (BENADRYL) 25 MG tablet Take 25 mg by mouth as needed for allergies.    [provider]  ferrous sulfate (FEROSUL) 325 (65 FE) MG tablet Take 1 tablet (325 mg total) by mouth 2 (two) times daily with a meal. 11/06/23   Newlin, Odette Horns, MD  furosemide (LASIX) 40 MG tablet TAKE ONE TABLET BY MOUTH ONE TIME DAILY, TAKE AN EXTRA TABLET AT NIGHT AS NEEDED 05/02/23   Bensimhon, Bevelyn Buckles, MD  methocarbamol (ROBAXIN) 500 MG tablet Take 2 tablets (1,000 mg total) by mouth every 8 (eight) hours as needed for muscle spasms. 02/21/23   Hoy Register, MD  metoprolol succinate (TOPROL-XL) 100 MG 24 hr tablet Take 1 tablet (100 mg total) by mouth daily. Take with or immediately following a meal. 10/20/22   McClung, Marzella Schlein, PA-C  pantoprazole (PROTONIX) 40 MG tablet Take 1 tablet (40 mg total) by mouth daily. No refills need, post procedure medication. 08/07/23 09/21/23  Jeanella Craze, NP  sacubitril-valsartan (ENTRESTO) 24-26 MG Take 1 tablet by mouth 2 (two) times daily. 11/07/23   Duke Salvia, MD  sodium chloride (OCEAN) 0.65 % SOLN nasal spray Place 1 spray into both nostrils daily as needed (for congestion).    [provider]  vitamin B-12 (CYANOCOBALAMIN) 1000 MCG tablet Take 1 tablet (1,000 mcg total) by mouth daily. 06/21/16   Marvel Plan, MD  Allergies    Azithromycin, Latex, Spinach, and Tape    Review of Systems   Review of Systems  All other systems reviewed and are negative.   Physical Exam Updated Vital Signs BP (!) 166/77 (BP Location: Right Arm)   Pulse 71   Temp 99.2 F (37.3 C)   Resp 18   Ht 5\' 8"  (1.727 m)   Wt 86.7 kg   LMP 05/18/2015 Comment: went for 3 week after skipping two months   SpO2 99%   BMI 29.06 kg/m  Physical Exam Vitals and nursing note reviewed.  Constitutional:      General: She is not in acute distress. HENT:     Head: Normocephalic and atraumatic.  Eyes:      Conjunctiva/sclera: Conjunctivae normal.     Pupils: Pupils are equal, round, and reactive to light.  Cardiovascular:     Rate and Rhythm: Normal rate and regular rhythm.  Pulmonary:     Effort: Pulmonary effort is normal. No respiratory distress.  Abdominal:     General: There is no distension.     Tenderness: There is no guarding.  Musculoskeletal:        General: No deformity or signs of injury.     Cervical back: Neck supple.  Skin:    Findings: No lesion or rash.     Comments: Erythema and warmth along the knee, no palpable knee effusion, range of motion of the knee intact, mild tenderness, no crepitus or fluctuance  Neurological:     General: No focal deficit present.     Mental Status: She is alert. Mental status is at baseline.     ED Results / Procedures / Treatments   Labs (all labs ordered are listed, but only abnormal results are displayed) Labs Reviewed - No data to display  EKG None  Radiology No results found.  Procedures Procedures    Medications Ordered in ED Medications - No data to display  ED Course/ Medical Decision Making/ A&P                                 Medical Decision Making Risk Prescription drug management.    58 year old female presenting to the emergency department with chief complaint of insect bite and redness and swelling.  The patient states that she was bit by something yesterday.  She since developed redness swelling and itchiness.  She has sunburn on her upper shoulders but does not have bilateral redness on her knees.  She feels slight pain and discomfort associated with swelling.  She had previously been on Augmentin for a sinus infection and is completing 2 more doses.  She developed this redness and swelling despite this medication.  On arrival, the patient was vitally stable, afebrile temperature 99.2, not tachycardic or tachypneic, BP 166/77, saturating 90% on room air.  Physical exam revealed cellulitis of the right knee  in the setting of a likely insect bite.  As the patient has breakthrough cellulitis despite being on Augmentin, will initiate a full course of treatment with doxycycline.  Advised that the patient return to the emergency department in the event of severe worsening symptoms despite outpatient oral antibiotic use.   Final Clinical Impression(s) / ED Diagnoses Final diagnoses:  Insect bite of right knee, initial encounter  Cellulitis of right lower extremity    Rx / DC Orders ED Discharge Orders          Ordered  doxycycline (VIBRAMYCIN) 100 MG capsule  2 times daily        11/26/23 2103    fluconazole (DIFLUCAN) 150 MG tablet   Once        11/26/23 2103              Ernie Avena, MD 11/26/23 2123

## 2023-11-29 ENCOUNTER — Telehealth: Payer: Self-pay | Admitting: Family Medicine

## 2023-11-29 NOTE — Telephone Encounter (Unsigned)
 Copied from CRM [id number]. Topic: Appointments - Appointment Scheduling >> Nov 29, 2023 12:54 PM Ashley [name] wrote: Patient/patient representative is calling to schedule an appointment follow up from hospital. There is no openings sooner, please call patient back for follow up appointment, Refer to attachments for appointment information.

## 2023-12-06 LAB — COLOGUARD: COLOGUARD: NEGATIVE

## 2023-12-07 ENCOUNTER — Encounter: Payer: Self-pay | Admitting: Family Medicine

## 2023-12-12 ENCOUNTER — Telehealth: Payer: Self-pay

## 2023-12-12 NOTE — Telephone Encounter (Signed)
 Copied from CRM [id number]. Topic: General - Other >> Dec 12, 2023  4:20 PM [name] wrote: Reason for CRM: Cindy with [employer] called to see if fax received for incontinence supplies. Not showing in Media. She will refax. Confirmed fax #.

## 2023-12-13 NOTE — Telephone Encounter (Signed)
Form has been received and will be faxed once complete. 

## 2023-12-18 NOTE — Telephone Encounter (Signed)
 FYI Copied from CRM [id number]. Topic: General - Other  >> Dec 12, 2023  4:20 PM [name] wrote: Reason for CRM: Cindy with [employer] called to see if fax received for incontinence supplies. Not showing in Media. She will refax. Confirmed fax #.  >> Dec 18, 2023  1:39 PM Star [place] wrote: Tiara with [employer] calling for an update on the fax for incontinent supplies

## 2023-12-18 NOTE — Telephone Encounter (Signed)
 Nurse notated 5 days ago form has been received.  This will be addressed when nurse returns

## 2023-12-19 ENCOUNTER — Ambulatory Visit (INDEPENDENT_AMBULATORY_CARE_PROVIDER_SITE_OTHER): Payer: Self-pay

## 2023-12-19 DIAGNOSIS — I495 Sick sinus syndrome: Secondary | ICD-10-CM

## 2023-12-19 LAB — CUP PACEART REMOTE DEVICE CHECK
Battery Remaining Longevity: 76 mo
Battery Remaining Percentage: 60 %
Battery Voltage: 3.01 V
Brady Statistic AP VP Percent: 1 %
Brady Statistic AP VS Percent: 28 %
Brady Statistic AS VP Percent: 1 %
Brady Statistic AS VS Percent: 72 %
Brady Statistic RA Percent Paced: 28 %
Brady Statistic RV Percent Paced: 1 %
Date Time Interrogation Session: 20250429035439
Implantable Lead Connection Status: 753985
Implantable Lead Connection Status: 753985
Implantable Lead Implant Date: 20201103
Implantable Lead Implant Date: 20201103
Implantable Lead Location: 753859
Implantable Lead Location: 753860
Implantable Pulse Generator Implant Date: 20201103
Lead Channel Impedance Value: 430 Ohm
Lead Channel Impedance Value: 580 Ohm
Lead Channel Pacing Threshold Amplitude: 0.75 V
Lead Channel Pacing Threshold Amplitude: 1.25 V
Lead Channel Pacing Threshold Pulse Width: 0.5 ms
Lead Channel Pacing Threshold Pulse Width: 0.5 ms
Lead Channel Sensing Intrinsic Amplitude: 12 mV
Lead Channel Sensing Intrinsic Amplitude: 5 mV
Lead Channel Setting Pacing Amplitude: 1 V
Lead Channel Setting Pacing Amplitude: 2.25 V
Lead Channel Setting Pacing Pulse Width: 0.5 ms
Lead Channel Setting Sensing Sensitivity: 2 mV
Pulse Gen Model: 2272
Pulse Gen Serial Number: 9174760

## 2023-12-19 NOTE — Telephone Encounter (Signed)
Paperwork had been completed.

## 2023-12-22 ENCOUNTER — Encounter: Payer: Self-pay | Admitting: Cardiology

## 2024-01-05 ENCOUNTER — Other Ambulatory Visit: Payer: Self-pay | Admitting: Physician Assistant

## 2024-01-07 ENCOUNTER — Other Ambulatory Visit: Payer: Self-pay | Admitting: Physician Assistant

## 2024-01-07 DIAGNOSIS — I10 Essential (primary) hypertension: Secondary | ICD-10-CM

## 2024-01-26 ENCOUNTER — Telehealth (HOSPITAL_COMMUNITY): Payer: Self-pay | Admitting: Internal Medicine

## 2024-01-31 ENCOUNTER — Ambulatory Visit: Admitting: Pulmonary Disease

## 2024-02-02 NOTE — Progress Notes (Signed)
 Remote pacemaker transmission.

## 2024-03-11 ENCOUNTER — Telehealth: Payer: Self-pay | Admitting: Internal Medicine

## 2024-03-11 NOTE — Telephone Encounter (Signed)
 Pt is moving and wants to talk nurse Aldona

## 2024-03-11 NOTE — Telephone Encounter (Signed)
 Pt is the process of moving to [place] and is back and forth until 8/3.  Says that her transmission box is in [place] and concerned if missing a transmission.  Aware her next one is scheduled for 7/29.  Says that she will be back and forth between now and 8/3 moving.  Aware I do not think this will be a problem but will forward to our device clinic for their FYI.  Aware they will follow up if we do not receive the transmission, and that they may r/s to after 8/3 but either way office will reach out if missed transmission.  Will forward to EP schedulers to arrange overdue follow up.  (Pt did confirm she would like to stay with our practice and not interested in establishing with new office in [place].)  She also mentions she has had to take and extra Metoprolol  a couple times d/t elevated BP.  Says that she has taken and extra 1/2 tablet a couple times in the evening when her BP was going back up.  Example given 177/80.  This morning her BP was 132/76.  Advised pt to follow up with PCP about her HTN, patient feels like it is stree related and that things will improve once she moves.  She will follow up with PCP if needed about her BP.

## 2024-03-15 NOTE — Telephone Encounter (Signed)
 Spoke w/ patient - she is scheduled for 8/22 w/ Dr. Cindie.

## 2024-03-19 ENCOUNTER — Ambulatory Visit: Payer: Self-pay

## 2024-03-19 DIAGNOSIS — I495 Sick sinus syndrome: Secondary | ICD-10-CM | POA: Diagnosis not present

## 2024-03-25 ENCOUNTER — Ambulatory Visit: Payer: Self-pay | Admitting: Cardiology

## 2024-03-25 LAB — CUP PACEART REMOTE DEVICE CHECK
Battery Remaining Longevity: 73 mo
Battery Remaining Percentage: 58 %
Battery Voltage: 3.01 V
Brady Statistic AP VP Percent: 1 %
Brady Statistic AP VS Percent: 24 %
Brady Statistic AS VP Percent: 1 %
Brady Statistic AS VS Percent: 76 %
Brady Statistic RA Percent Paced: 23 %
Brady Statistic RV Percent Paced: 1 %
Date Time Interrogation Session: 20250803003825
Implantable Lead Connection Status: 753985
Implantable Lead Connection Status: 753985
Implantable Lead Implant Date: 20201103
Implantable Lead Implant Date: 20201103
Implantable Lead Location: 753859
Implantable Lead Location: 753860
Implantable Pulse Generator Implant Date: 20201103
Lead Channel Impedance Value: 440 Ohm
Lead Channel Impedance Value: 560 Ohm
Lead Channel Pacing Threshold Amplitude: 0.625 V
Lead Channel Pacing Threshold Amplitude: 1.625 V
Lead Channel Pacing Threshold Pulse Width: 0.5 ms
Lead Channel Pacing Threshold Pulse Width: 0.5 ms
Lead Channel Sensing Intrinsic Amplitude: 12 mV
Lead Channel Sensing Intrinsic Amplitude: 5 mV
Lead Channel Setting Pacing Amplitude: 0.875
Lead Channel Setting Pacing Amplitude: 3.125
Lead Channel Setting Pacing Pulse Width: 0.5 ms
Lead Channel Setting Sensing Sensitivity: 2 mV
Pulse Gen Model: 2272
Pulse Gen Serial Number: 9174760

## 2024-04-09 ENCOUNTER — Other Ambulatory Visit: Payer: Self-pay | Admitting: Family Medicine

## 2024-04-09 ENCOUNTER — Telehealth: Payer: Self-pay | Admitting: Family Medicine

## 2024-04-09 DIAGNOSIS — I10 Essential (primary) hypertension: Secondary | ICD-10-CM

## 2024-04-09 MED ORDER — METOPROLOL SUCCINATE ER 100 MG PO TB24: 100.0000 mg | ORAL_TABLET | Freq: Every day | ORAL | 0 refills | Status: DC

## 2024-04-09 NOTE — Telephone Encounter (Unsigned)
 Copied from CRM [id number]. Topic: Clinical - Medication Refill >> Apr 09, 2024 10:13 AM Tiffini S wrote: Medication: metoprolol  succinate (TOPROL -XL) 100 MG 24 hr tablet  Has the patient contacted their pharmacy? No (Agent: If no, request that the patient contact the pharmacy for the refill. If patient does not wish to contact the pharmacy document the reason why and proceed with request.) (Agent: If yes, when and what did the pharmacy advise?)  This is the patient's preferred pharmacy:   [facility] [address] [place], [place] [address] [phone]  Is this the correct pharmacy for this prescription? Yes If no, delete pharmacy and type the correct one.   Has the prescription been filled recently? Yes  Is the patient out of the medication? Yes, patient have three tablets left   Has the patient been seen for an appointment in the last year OR does the patient have an upcoming appointment? Yes  Can we respond through MyChart? Please call at [phone]  Agent: Please be advised that Rx refills may take up to 3 business days. We ask that you follow-up with your pharmacy.

## 2024-04-09 NOTE — Telephone Encounter (Unsigned)
 Copied from CRM #[id number]. Topic: Clinical - Medication Question >> Apr 09, 2024 10:25 AM Tiffini S wrote: Reason for CRM: Patient was scheduled a appointment for 05/17/24 for medication refill metoprolol  succinate (TOPROL -XL) 100 MG 24 hr tablet- the appointment was made if the patient needs the appointment for refills. She have three tablets left and wants to cancel the appointment if it is not needed.   Please call to advise the patient at [phone].

## 2024-04-09 NOTE — Telephone Encounter (Signed)
 Refill has been sent. VM left informing patient to keep upcoming appointment.

## 2024-04-10 NOTE — Progress Notes (Unsigned)
  Electrophysiology Office Follow up Visit Note:    Date:  04/12/2024   ID:  Ashley [name], DOB December 12, 1965, MRN [id number]  PCP:  Delbert Clam, MD  [facility] Cardiologist:  Vina Gull, MD  [facility] Electrophysiologist:  OLE ONEIDA HOLTS, MD    Interval History:     Ashley [name] is a 58 y.o. female who presents for a follow up visit.   The patient was previously followed by Dr. Fernande.  She has a history of atrial fibrillation and flutter, coronary artery disease with prior CABG, mitral valve prolapse with severe mitral regurgitation postrepair, hypertension, stroke, tobacco abuse.  She last saw Brandi November 10, 2023.  I have met the patient in the past.  I did an atrial fibrillation ablation for her August 07, 2023.  No sustained/prolonged episodes of atrial fibrillation on device interrogation since ablation.  She takes Eliquis  for stroke prophylaxis.  She is doing well.  She is with family today.  No questions or problems today.       Past medical, surgical, social and family history were reviewed.  ROS:   Please see the history of present illness.    All other systems reviewed and are negative.  EKGs/Labs/Other Studies Reviewed:    The following studies were reviewed today:  April 12, 2024 in-clinic device interrogation personally reviewed Battery and lead parameter stable No programming changes made today          Physical Exam:    VS:  BP 112/68   Pulse 65   Ht 5' 8.5 (1.74 m)   Wt 188 lb 14.4 oz (85.7 kg)   LMP 05/18/2015 Comment: went for 3 week after skipping two months   SpO2 99%   BMI 28.30 kg/m     Wt Readings from Last 3 Encounters:  04/12/24 188 lb 14.4 oz (85.7 kg)  11/26/23 191 lb 2.2 oz (86.7 kg)  11/10/23 191 lb 3.2 oz (86.7 kg)     GEN: no distress CARD: RRR, No MRG.  Pacemaker pocket well-healed RESP: No IWOB. CTAB.      ASSESSMENT:    1. Persistent atrial fibrillation (HCC)   2. Sick sinus  syndrome (HCC)   3. Pacemaker    PLAN:    In order of problems listed above:  #Sinus node dysfunction #Permanent pacemaker in situ Device functioning appropriately.  Continue remote monitoring.  #Persistent atrial fibrillation Continue Eliquis  Off antiarrhythmics  Follow-up 1 year with APP   Signed, OLE HOLTS, MD, [facility], [facility] 04/12/2024 2:12 PM    Electrophysiology [facility]

## 2024-04-12 ENCOUNTER — Ambulatory Visit: Attending: Cardiology | Admitting: Cardiology

## 2024-04-12 ENCOUNTER — Encounter: Payer: Self-pay | Admitting: Cardiology

## 2024-04-12 VITALS — BP 112/68 | HR 65 | Ht 68.5 in | Wt 188.9 lb

## 2024-04-12 DIAGNOSIS — I495 Sick sinus syndrome: Secondary | ICD-10-CM | POA: Diagnosis present

## 2024-04-12 DIAGNOSIS — I4819 Other persistent atrial fibrillation: Secondary | ICD-10-CM | POA: Insufficient documentation

## 2024-04-12 DIAGNOSIS — Z950 Presence of cardiac pacemaker: Secondary | ICD-10-CM | POA: Diagnosis present

## 2024-04-12 LAB — CUP PACEART INCLINIC DEVICE CHECK
Battery Remaining Longevity: 78 mo
Battery Voltage: 3.01 V
Brady Statistic RA Percent Paced: 24 %
Brady Statistic RV Percent Paced: 0.4 %
Date Time Interrogation Session: 20250822171129
Implantable Lead Connection Status: 753985
Implantable Lead Connection Status: 753985
Implantable Lead Implant Date: 20201103
Implantable Lead Implant Date: 20201103
Implantable Lead Location: 753859
Implantable Lead Location: 753860
Implantable Pulse Generator Implant Date: 20201103
Lead Channel Impedance Value: 487.5 Ohm
Lead Channel Impedance Value: 637.5 Ohm
Lead Channel Pacing Threshold Amplitude: 0.75 V
Lead Channel Pacing Threshold Amplitude: 0.75 V
Lead Channel Pacing Threshold Amplitude: 1.25 V
Lead Channel Pacing Threshold Amplitude: 1.25 V
Lead Channel Pacing Threshold Pulse Width: 0.5 ms
Lead Channel Pacing Threshold Pulse Width: 0.5 ms
Lead Channel Pacing Threshold Pulse Width: 0.5 ms
Lead Channel Pacing Threshold Pulse Width: 0.5 ms
Lead Channel Sensing Intrinsic Amplitude: 12 mV
Lead Channel Sensing Intrinsic Amplitude: 5 mV
Lead Channel Setting Pacing Amplitude: 0.875
Lead Channel Setting Pacing Amplitude: 2.25 V
Lead Channel Setting Pacing Pulse Width: 0.5 ms
Lead Channel Setting Sensing Sensitivity: 2 mV
Pulse Gen Model: 2272
Pulse Gen Serial Number: 9174760

## 2024-04-12 NOTE — Patient Instructions (Signed)
 Medication Instructions:  Your physician recommends that you continue on your current medications as directed. Please refer to the Current Medication list given to you today.  *If you need a refill on your cardiac medications before your next appointment, please call your pharmacy*  Lab Work: None ordered.  If you have labs (blood work) drawn today and your tests are completely normal, you will receive your results only by: MyChart Message (if you have MyChart) OR A paper copy in the mail If you have any lab test that is abnormal or we need to change your treatment, we will call you to review the results.  Testing/Procedures: None ordered.    Follow-Up: At [facility], you and your health needs are our priority.  As part of our continuing mission to provide you with exceptional heart care, our providers are all part of one team.  This team includes your primary Cardiologist (physician) and Advanced Practice Providers or APPs (Physician Assistants and Nurse Practitioners) who all work together to provide you with the care you need, when you need it.  Your next appointment:   12 months with Dr Hiram APP

## 2024-04-15 ENCOUNTER — Ambulatory Visit: Payer: Self-pay | Admitting: Cardiology

## 2024-05-01 ENCOUNTER — Other Ambulatory Visit: Payer: Self-pay | Admitting: *Deleted

## 2024-05-01 ENCOUNTER — Other Ambulatory Visit: Payer: Self-pay | Admitting: Cardiology

## 2024-05-01 ENCOUNTER — Other Ambulatory Visit: Payer: Self-pay | Admitting: Family Medicine

## 2024-05-01 DIAGNOSIS — I4819 Other persistent atrial fibrillation: Secondary | ICD-10-CM

## 2024-05-01 DIAGNOSIS — I5042 Chronic combined systolic (congestive) and diastolic (congestive) heart failure: Secondary | ICD-10-CM

## 2024-05-01 MED ORDER — ATORVASTATIN CALCIUM 80 MG PO TABS: 80.0000 mg | ORAL_TABLET | Freq: Every day | ORAL | 3 refills | Status: AC

## 2024-05-01 MED ORDER — APIXABAN 5 MG PO TABS: 5.0000 mg | ORAL_TABLET | Freq: Two times a day (BID) | ORAL | 1 refills | Status: DC

## 2024-05-01 NOTE — Telephone Encounter (Unsigned)
 Copied from CRM #[id number]. Topic: Clinical - Medication Refill >> May 01, 2024  4:51 PM Kevelyn M wrote: Medication: FARXIGA  10 MG TABS tablet  Has the patient contacted their pharmacy? Yes (Agent: If no, request that the patient contact the pharmacy for the refill. If patient does not wish to contact the pharmacy document the reason why and proceed with request.) (Agent: If yes, when and what did the pharmacy advise?)  This is the patient's preferred pharmacy:  [id number] [place], [place] - [address] AT [facility] OF [facility] [address] [facility] [address] [place] [address] Phone: [phone] Fax: [phone]  Is this the correct pharmacy for this prescription? Yes If no, delete pharmacy and type the correct one.   Has the prescription been filled recently? No  Is the patient out of the medication? Yes  Has the patient been seen for an appointment in the last year OR does the patient have an upcoming appointment? Yes  Can we respond through MyChart? Yes  Agent: Please be advised that Rx refills may take up to 3 business days. We ask that you follow-up with your pharmacy.

## 2024-05-01 NOTE — Telephone Encounter (Signed)
 Pt's medication was sent to pt's pharmacy as requested. Confirmation received.

## 2024-05-01 NOTE — Telephone Encounter (Signed)
 Eliquis  5mg  refill request received. Patient is 58 years old, weight-85.7kg, Crea-1.21 on 07/19/23, Diagnosis-Afib, and last seen by Dr. Cindie on 04/12/24. Dose is appropriate based on dosing criteria. Will send in refill to requested pharmacy.

## 2024-05-01 NOTE — Telephone Encounter (Signed)
*  STAT* If patient is at the pharmacy, call can be transferred to refill team.   1. Which medications need to be refilled? (please list name of each medication and dose if known) apixaban  (ELIQUIS ) 5 MG TABS tablet   atorvastatin  (LIPITOR ) 80 MG tablet   2. Which pharmacy/location (including street and city if local pharmacy) is medication to be sent to?  [facility] #[id number] - [place], [place] - [address] AT [facility] [address]    3. Do they need a 30 day or 90 day supply? 90

## 2024-05-01 NOTE — Telephone Encounter (Signed)
 Eliquis  prescription refill has been sent today. This is a duplicate.

## 2024-05-02 NOTE — Telephone Encounter (Signed)
 Requested medication (s) are due for refill today: yes  Requested medication (s) are on the active medication list: yes  Last refill:  11/06/23  Future visit scheduled: yes  Notes to clinic:  Unable to refill per protocol due to failed labs, no updated results.      Requested Prescriptions  Pending Prescriptions Disp Refills   dapagliflozin  propanediol (FARXIGA ) 10 MG TABS tablet 90 tablet 1    Sig: Take 1 tablet (10 mg total) by mouth daily.     Endocrinology:  Diabetes - SGLT2 Inhibitors Failed - 05/02/2024  3:26 PM      Failed - Cr in normal range and within 360 days    Creat  Date Value Ref Range Status  04/08/2016 0.82 0.50 - 1.05 mg/dL Final    Comment:      For patients > or = 60 years of age: The upper reference limit for Creatinine is approximately 13% higher for people identified as African-American.      Creatinine, Ser  Date Value Ref Range Status  07/19/2023 1.21 (H) 0.57 - 1.00 mg/dL Final         Failed - HBA1C is between 0 and 7.9 and within 180 days    Hemoglobin-A1c  Date Value Ref Range Status  05/15/2011 6.4 5.4 - 7.4 % Final   Hemoglobin A1C  Date Value Ref Range Status  03/16/2020 5.1 4.0 - 5.6 % Final   HbA1c, POC (controlled diabetic range)  Date Value Ref Range Status  09/30/2019 5.3 0.0 - 7.0 % Final         Failed - eGFR in normal range and within 360 days    GFR calc Af Amer  Date Value Ref Range Status  10/09/2019 72 >59 mL/min/1.73 Final   GFR, Estimated  Date Value Ref Range Status  11/15/2022 33 (L) >60 mL/min Final    Comment:    (NOTE) Calculated using the CKD-EPI Creatinine Equation (2021)    GFR  Date Value Ref Range Status  08/07/2013 65.66 >60.00 mL/min Final   eGFR  Date Value Ref Range Status  07/19/2023 52 (L) >59 mL/min/1.73 Final         Passed - Valid encounter within last 6 months    Recent Outpatient Visits           5 months ago Screening for colon cancer   [facility] - A  Dept Of [place]. [facility] Delbert Clam, MD   1 year ago Right arm pain   [facility] - A Dept Of [place]. 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 [facility] [place], [place], [place]   1 year ago Upper back pain on right side   [facility] - A Dept Of [place]. [facility] [place], [place], [place]   3 years ago Other persistent atrial fibrillation [facility])   [facility] - A Dept Of [place]. [facility] Delbert Clam, MD

## 2024-05-03 MED ORDER — DAPAGLIFLOZIN PROPANEDIOL 10 MG PO TABS: 10.0000 mg | ORAL_TABLET | Freq: Every day | ORAL | 1 refills | Status: DC

## 2024-05-06 ENCOUNTER — Other Ambulatory Visit: Payer: Self-pay

## 2024-05-17 ENCOUNTER — Ambulatory Visit: Admitting: Internal Medicine

## 2024-05-22 NOTE — Progress Notes (Signed)
 Remote PPM Transmission

## 2024-06-10 ENCOUNTER — Telehealth: Payer: Self-pay | Admitting: Cardiology

## 2024-06-10 NOTE — Telephone Encounter (Signed)
 Pt c/o medication issue:  1. Name of Medication:   sacubitril -valsartan  (ENTRESTO ) 24-26 MG    2. How are you currently taking this medication (dosage and times per day)? As written  3. Are you having a reaction (difficulty breathing--STAT)? No  4. What is your medication issue? Pt states that she has not taken medication in 2 months due to leaving it as her son's. Pt says that since not taking medication she has not has any issues and would like to know if she should start continue taking it. Please advise

## 2024-06-12 NOTE — Telephone Encounter (Signed)
 Pt is requesting a callback regarding her following up on being advised on what to do. Please advise

## 2024-06-12 NOTE — Telephone Encounter (Signed)
 Spoke with the patient who reports that she has been feeling fine off of her Entresto . Blood pressures have been averaging 125/80. She is overdue for an appointment with CHF clinic. Advised that I would send them a message to get her back in for an appointment to discus medications.

## 2024-06-13 NOTE — Telephone Encounter (Signed)
 Will send to schedulers to assist with appointment

## 2024-06-18 ENCOUNTER — Ambulatory Visit (INDEPENDENT_AMBULATORY_CARE_PROVIDER_SITE_OTHER): Payer: Self-pay

## 2024-06-18 DIAGNOSIS — I4819 Other persistent atrial fibrillation: Secondary | ICD-10-CM

## 2024-06-19 LAB — CUP PACEART REMOTE DEVICE CHECK
Battery Remaining Longevity: 70 mo
Battery Remaining Percentage: 56 %
Battery Voltage: 3.01 V
Brady Statistic AP VP Percent: 1 %
Brady Statistic AP VS Percent: 28 %
Brady Statistic AS VP Percent: 1 %
Brady Statistic AS VS Percent: 72 %
Brady Statistic RA Percent Paced: 27 %
Brady Statistic RV Percent Paced: 1 %
Date Time Interrogation Session: 20251028020024
Implantable Lead Connection Status: 753985
Implantable Lead Connection Status: 753985
Implantable Lead Implant Date: 20201103
Implantable Lead Implant Date: 20201103
Implantable Lead Location: 753859
Implantable Lead Location: 753860
Implantable Pulse Generator Implant Date: 20201103
Lead Channel Impedance Value: 450 Ohm
Lead Channel Impedance Value: 580 Ohm
Lead Channel Pacing Threshold Amplitude: 0.75 V
Lead Channel Pacing Threshold Amplitude: 1.375 V
Lead Channel Pacing Threshold Pulse Width: 0.5 ms
Lead Channel Pacing Threshold Pulse Width: 0.5 ms
Lead Channel Sensing Intrinsic Amplitude: 12 mV
Lead Channel Sensing Intrinsic Amplitude: 5 mV
Lead Channel Setting Pacing Amplitude: 1 V
Lead Channel Setting Pacing Amplitude: 2.375
Lead Channel Setting Pacing Pulse Width: 0.5 ms
Lead Channel Setting Sensing Sensitivity: 2 mV
Pulse Gen Model: 2272
Pulse Gen Serial Number: 9174760

## 2024-06-20 ENCOUNTER — Ambulatory Visit: Payer: Self-pay | Admitting: Cardiology

## 2024-06-25 NOTE — Progress Notes (Signed)
 Remote PPM Transmission

## 2024-06-26 ENCOUNTER — Ambulatory Visit (HOSPITAL_COMMUNITY)
Admission: RE | Admit: 2024-06-26 | Discharge: 2024-06-26 | Disposition: A | Discharge status: Home / Self Care | Source: Ambulatory Visit | Attending: Cardiology | Admitting: Cardiology

## 2024-06-26 ENCOUNTER — Encounter (HOSPITAL_COMMUNITY): Payer: Self-pay

## 2024-06-26 VITALS — BP 159/79 | HR 67 | Ht 68.0 in | Wt 197.8 lb

## 2024-06-26 DIAGNOSIS — I5022 Chronic systolic (congestive) heart failure: Secondary | ICD-10-CM | POA: Insufficient documentation

## 2024-06-26 DIAGNOSIS — I110 Hypertensive heart disease with heart failure: Secondary | ICD-10-CM | POA: Insufficient documentation

## 2024-06-26 DIAGNOSIS — Z5941 Food insecurity: Secondary | ICD-10-CM | POA: Diagnosis not present

## 2024-06-26 DIAGNOSIS — Z45018 Encounter for adjustment and management of other part of cardiac pacemaker: Secondary | ICD-10-CM | POA: Diagnosis not present

## 2024-06-26 DIAGNOSIS — I4819 Other persistent atrial fibrillation: Secondary | ICD-10-CM

## 2024-06-26 DIAGNOSIS — Z7901 Long term (current) use of anticoagulants: Secondary | ICD-10-CM | POA: Insufficient documentation

## 2024-06-26 DIAGNOSIS — T462X5A Adverse effect of other antidysrhythmic drugs, initial encounter: Secondary | ICD-10-CM | POA: Insufficient documentation

## 2024-06-26 DIAGNOSIS — E785 Hyperlipidemia, unspecified: Secondary | ICD-10-CM | POA: Diagnosis not present

## 2024-06-26 DIAGNOSIS — I4891 Unspecified atrial fibrillation: Secondary | ICD-10-CM | POA: Insufficient documentation

## 2024-06-26 DIAGNOSIS — I4892 Unspecified atrial flutter: Secondary | ICD-10-CM | POA: Diagnosis not present

## 2024-06-26 DIAGNOSIS — I2510 Atherosclerotic heart disease of native coronary artery without angina pectoris: Secondary | ICD-10-CM | POA: Diagnosis not present

## 2024-06-26 DIAGNOSIS — Z79899 Other long term (current) drug therapy: Secondary | ICD-10-CM | POA: Diagnosis not present

## 2024-06-26 DIAGNOSIS — Z91048 Other nonmedicinal substance allergy status: Secondary | ICD-10-CM | POA: Insufficient documentation

## 2024-06-26 DIAGNOSIS — Z91040 Latex allergy status: Secondary | ICD-10-CM | POA: Insufficient documentation

## 2024-06-26 DIAGNOSIS — I10 Essential (primary) hypertension: Secondary | ICD-10-CM | POA: Diagnosis not present

## 2024-06-26 DIAGNOSIS — Z87891 Personal history of nicotine dependence: Secondary | ICD-10-CM | POA: Insufficient documentation

## 2024-06-26 DIAGNOSIS — I495 Sick sinus syndrome: Secondary | ICD-10-CM | POA: Diagnosis not present

## 2024-06-26 DIAGNOSIS — I081 Rheumatic disorders of both mitral and tricuspid valves: Secondary | ICD-10-CM | POA: Diagnosis not present

## 2024-06-26 DIAGNOSIS — Z951 Presence of aortocoronary bypass graft: Secondary | ICD-10-CM | POA: Diagnosis not present

## 2024-06-26 DIAGNOSIS — E0580 Other thyrotoxicosis without thyrotoxic crisis or storm: Secondary | ICD-10-CM | POA: Diagnosis not present

## 2024-06-26 DIAGNOSIS — Z8673 Personal history of transient ischemic attack (TIA), and cerebral infarction without residual deficits: Secondary | ICD-10-CM | POA: Diagnosis not present

## 2024-06-26 DIAGNOSIS — I5042 Chronic combined systolic (congestive) and diastolic (congestive) heart failure: Secondary | ICD-10-CM

## 2024-06-26 MED ORDER — FUROSEMIDE 40 MG PO TABS: 40.0000 mg | ORAL_TABLET | Freq: Every day | ORAL | 11 refills | Status: AC

## 2024-06-26 MED ORDER — METOPROLOL SUCCINATE ER 100 MG PO TB24: 100.0000 mg | ORAL_TABLET | Freq: Every day | ORAL | 1 refills | Status: AC

## 2024-06-26 MED ORDER — SACUBITRIL-VALSARTAN 24-26 MG PO TABS: 1.0000 | ORAL_TABLET | Freq: Two times a day (BID) | ORAL | 2 refills | Status: DC

## 2024-06-26 MED ORDER — APIXABAN 5 MG PO TABS: 5.0000 mg | ORAL_TABLET | Freq: Two times a day (BID) | ORAL | 1 refills | Status: AC

## 2024-06-26 MED ORDER — DAPAGLIFLOZIN PROPANEDIOL 10 MG PO TABS: 10.0000 mg | ORAL_TABLET | Freq: Every day | ORAL | 1 refills | Status: AC

## 2024-06-26 NOTE — Progress Notes (Addendum)
 ADVANCED HEART FAILURE CLINIC NOTE   Primary Care: Dr. Delbert HF Dr. Cherrie  Reason for f/u: f/u for heart failure    HPI:  Ashley [name] is a 58 y.o. female with history of CAD s/p CABG in 2013, MVP with severe MR s/p MV repair at time of CABG, chronic systolic CHF, sinus node dysfunction s/p 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   Found to be in atrial flutter with RVR 03/20. EF down to 30% on echo. Underwent DCCV 06/20. She had recurrence of AF but EF had improved to 60-65% on subsequent echo 07/20.   Echo 01/23: EF 50-55%.   Has been on amiodarone  for atrial arrhythmias. Amiodarone  increased 03/23 d/t atrial tachycardia.   Admitted 4/23 with AT with RVR and volume overload. Attempts to pace out were unsuccessful and she underwent DCCV. She was readmitted about 10 days later with recurrent AF and volume overload. Treated with IV amio. Echo: EF 30-35%, RV moderately reduced, RVSP 58 mmHg, severe LAE, moderate to severe mitral stenosis with mean gradient 9 mmHg and moderate MR, severe TR  TEE 01/19/22: EF 30-35%, RV severely enlarged with moderately reduced function, severe LAE, mild mitral stenosis with MG 4 mmHg across mitral valve (likely related to small ring), moderate MR, RVSP 58 mmHg, moderate TR.  Saw Dr. Fernande in 7/23 amio stopped due to hyperthyroidism. Treated with prednisone  x 4 weeks.   Echo  05/18/22 EF 40% mild to moderate MR, mild TR  Myoview  3/24 EF 44% normal perfusion   Had Afib ablation 12/24 by Dr. Cindie.   She presents today for f/u for heart failure. Doing well. Reports stable NYHA Class II symptoms. Denies resting dyspnea. No orthopnea/PND or LEE. Denies CP. No palpitations. Reports full med compliance. BP is elevated in clinic but she says she was rushing to get here from [place] and had to travel on 40. She says home BP readings are typically in the 120s. Also reports seeing PCP last  wk and said that BP was controlled.   She also had labs done at PCP office. I personally reviewed labs. LDL was above goal at 83 mg/dl. SCr was ok at 1.24, K 4.6    Review of Systems: Cardiac and Respiratory. Negative except as mentioned in HPI   Past Medical History:  Diagnosis Date   Abdominal pain    ? chronic cholecystitis; admxn to [facility] 9/12 (CT, USN, HIDA done)   Atrial fibrillation (HCC)    Cardiomyopathy    echo 9/12: mild LVH, EF 35-40%, mod MR (difficult to judge /post leaflet restricted), mod BAE, mod RVE, mod TR    Cardiomyopathy (HCC)    a. echo 9/12: mild LVH, EF 35-40%, mod MR (difficult to judge /post leaflet restricted), mod BAE, mod RVE, mod TR. b. Echo 11/2011: mild LVH, EF 55-60%, s/p MV repair w/o sig MR/MS, mildly dilated RV/RA, PASP . // AFL dx 10/2018 - Echo 10/2018: EF 30, diff HK, s/p MV repair with trivial MR, mean MV 7 but not c/w significant mitral stenosis, mild RAE, severe LAE, mod reduced RVSF    Carotid artery disease    a. Duplex 06/2015: 50% RECA, 1-39% BICA.   CHF (congestive heart failure) (HCC)    Clotting disorder    Coronary artery disease    a. s/p CABG 10/2011 - LIMA-LAD, SVG-PDA, SVG-OM, SVG-diagonal.   Dyslipidemia    Essential hypertension    Heart murmur  Hemifacial spasm    History of Doppler ultrasound    a. Carotid US  [facility] in [place], [place]) 7/17: no hemodynamically significant ICA stenosis   Hyperlipidemia    Mitral regurgitation    a. s/p MV repair 10/2011 - on Coumadin  for 3 months afterwards then d/c'd by surgery.   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 age 50 - in setting of cigs and OCPs. //  b. s/p acute L parietal CVA >> tPA - [facility] in [place], [place]   Stroke [facility]) 01/2017   Tobacco abuse     Current Outpatient Medications  Medication Sig Dispense Refill   acetaminophen  (TYLENOL ) 500 MG tablet  Take 500-1,000 mg by mouth as needed for headache.     ascorbic acid  (VITAMIN C ) 500 MG tablet Take 500 mg by mouth 2 (two) times daily.     atorvastatin  (LIPITOR ) 80 MG tablet Take 1 tablet (80 mg total) by mouth daily. 90 tablet 3   diphenhydrAMINE  (BENADRYL ) 25 MG tablet Take 25 mg by mouth as needed for allergies.     ferrous sulfate  (FEROSUL) 325 (65 FE) MG tablet Take 1 tablet (325 mg total) by mouth 2 (two) times daily with a meal. 180 tablet 1   methocarbamol  (ROBAXIN ) 500 MG tablet Take 2 tablets (1,000 mg total) by mouth every 8 (eight) hours as needed for muscle spasms. 90 tablet 0   pantoprazole  (PROTONIX ) 40 MG tablet Take 1 tablet (40 mg total) by mouth daily. No refills need, post procedure medication. 45 tablet 0   sodium chloride  (OCEAN) 0.65 % SOLN nasal spray Place 1 spray into both nostrils daily as needed (for congestion).     vitamin B-12 (CYANOCOBALAMIN ) 1000 MCG tablet Take 1 tablet (1,000 mcg total) by mouth daily.     amoxicillin -clavulanate (AUGMENTIN ) 875-125 MG tablet Take 1 tablet by mouth 2 (two) times daily. (Patient not taking: Reported on 06/26/2024) 20 tablet 0   apixaban  (ELIQUIS ) 5 MG TABS tablet Take 1 tablet (5 mg total) by mouth 2 (two) times daily. 180 tablet 1   dapagliflozin  propanediol (FARXIGA ) 10 MG TABS tablet Take 1 tablet (10 mg total) by mouth daily. 90 tablet 1   furosemide  (LASIX ) 40 MG tablet Take 1 tablet (40 mg total) by mouth daily. 45 tablet 11   metoprolol  succinate (TOPROL -XL) 100 MG 24 hr tablet Take 1 tablet (100 mg total) by mouth daily. Take with or immediately following a meal. 90 tablet 1   sacubitril -valsartan  (ENTRESTO ) 24-26 MG Take 1 tablet by mouth 2 (two) times daily. 180 tablet 2   Current Facility-Administered Medications  Medication Dose Route Frequency Provider Last Rate Last Admin   incobotulinumtoxinA  (XEOMIN ) 50 units injection 50 Units  50 Units Intramuscular Q90 days Onita Duos, MD   50 Units at 11/30/16 1546    incobotulinumtoxinA  (XEOMIN ) 50 units injection 50 Units  50 Units Intramuscular Q90 days Onita Duos, MD   50 Units at 07/25/17 0843   incobotulinumtoxinA  (XEOMIN ) 50 units injection 50 Units  50 Units Intramuscular Q90 days Onita Duos, MD   50 Units at 07/26/17 1217   incobotulinumtoxinA  (XEOMIN ) 50 units injection 50 Units  50 Units Intramuscular Q90 days Onita Duos, MD   50 Units at 06/05/23 1138   incobotulinumtoxinA  (XEOMIN ) 50 units injection 50 Units  50 Units Intramuscular Q90 days Onita Duos, MD  50 Units at 10/25/23 2259    Allergies  Allergen Reactions   Azithromycin Nausea Only   Latex Rash   Spinach Hives    Cooked    Tape Dermatitis and Other (See Comments)    PLEASE USE PAPER TAPE!! AFFECTS SKIN BADLY!!!!      Social History   Socioeconomic History   Marital status: Widowed    Spouse name: Not on file   Number of children: 2   Years of education: Not on file   Highest education level: Bachelor's degree (e.g., BA, AB, BS)  Occupational History   Occupation: Payroll/Paperwork    Comment: Owns a contractor.   Occupation: Disability    Comment: since march 2023  Tobacco Use   Smoking status: Former    Current packs/day: 0.00    Average packs/day: 1 pack/day for 30.0 years (30.0 ttl pk-yrs)    Types: Cigarettes    Start date: 10/1991    Quit date: 10/2021    Years since quitting: 2.6   Smokeless tobacco: Never  Vaping Use   Vaping status: Never Used  Substance and Sexual Activity   Alcohol use: Yes    Alcohol/week: 1.0 standard drink of alcohol    Types: 1 Cans of beer per week    Comment: can of beer 1 x week   Drug use: No   Sexual activity: Yes  Other Topics Concern   Not on file  Social History Narrative   Lives at home with husband and son.   Right-handed.   2 cups caffeine daily.   Social Drivers of Corporate Investment Banker Strain: Low Risk  (06/14/2024)   Received from [facility]   Overall Financial Resource Strain  (CARDIA)    How hard is it for you to pay for the very basics like food, housing, medical care, and heating?: Not hard at all  Food Insecurity: Food Insecurity Present (06/14/2024)   Received from [facility]   Hunger Vital Sign    Within the past 12 months, you worried that your food would run out before you got the money to buy more.: Sometimes true    Within the past 12 months, the food you bought just didn't last and you didn't have money to get more.: Never true  Transportation Needs: No Transportation Needs (06/14/2024)   Received from [facility] - Transportation    In the past 12 months, has lack of transportation kept you from medical appointments or from getting medications?: No    In the past 12 months, has lack of transportation kept you from meetings, work, or from getting things needed for daily living?: No  Physical Activity: Sufficiently Active (06/14/2024)   Received from [facility]   Exercise Vital Sign    On average, how many days per week do you engage in moderate to strenuous exercise (like a brisk walk)?: 5 days    On average, how many minutes do you engage in exercise at this level?: 30 min  Stress: No Stress Concern Present (06/14/2024)   Received from [facility] Occupational [facility] - Occupational Stress Questionnaire    Do you feel stress - tense, restless, nervous, or anxious, or unable to sleep at night because your mind is troubled all the time - these days?: Only a little  Social Connections: Moderately Integrated (06/14/2024)   Received from [facility]   Social Network    How would you rate your social  network (family, work, friends)?: Adequate participation with social networks  Intimate Partner Violence: Not At Risk (06/14/2024)   Received from [facility]   HITS    Over the last 12 months how often did your partner physically hurt you?: Never    Over the last 12 months how often did your partner insult you  or talk down to you?: Never    Over the last 12 months how often did your partner threaten you with physical harm?: Never    Over the last 12 months how often did your partner scream or curse at you?: Never      Family History  Problem Relation Age of Onset   Hypertension Mother    Stroke Mother    Heart disease Father    COPD Father    Bone cancer Maternal Grandmother    Anesthesia problems Neg Hx    Hypotension Neg Hx    Malignant hyperthermia Neg Hx    Pseudochol deficiency Neg Hx    Colon cancer Neg Hx     Vitals:   06/26/24 1334  BP: (!) 159/79  Pulse: 67  SpO2: 98%  Weight: 89.7 kg (197 lb 12.8 oz)  Height: 5' 8 (1.727 m)     Physical Exam  GENERAL: NAD Lungs- clear  CARDIAC:  JVP not elevated          Normal rate with regular rhythm. 2/6 TR murmur. No LEE  ABDOMEN: Soft, non-tender, non-distended.  EXTREMITIES: Warm and well perfused.  NEUROLOGIC: No obvious FND  Pacer interrogated in clinic personally. Device says AT/AF burden < 1%, 28% A paced    ASSESSMENT & PLAN:  Chronic systolic CHF: -Echo 11/2011: EF 55-60% -Echo 10/2018: EF 30%. In setting of AFL with RVR -Echo 02/2019: EF 60-65% -Echo 01/23: EF 50-55% -Recurrent atrial arrhythmias end of January 2023 -Echo 04/23 in setting of atrial tach with RVR: EF 30-35%, RV moderately reduced, RVSP 58 mmHg, s/p MV repair, at least moderate MR, severe TR, dilated IVC with estimated RAP 15 mmHg -TEE 01/19/22: EF 30-35%, RV severely enlarged with moderately reduced function, severe LAE, mild mitral stenosis with MG 4 mmHg across mitral valve ring (likely related to small ring), moderate MR, RVSP 58 mmHg, moderate TR - Echo 05/18/22 EF 40% mild to moderate MR, mild MS, mild TR Personally reviewed - Myoview  3/24 EF 44% normal perfusion  - Etiology not certain. ?Tachy-mediated in setting of atrial arrhythmias. EF recovering slowly with restoration of SR. Will continue to follow (will plan repeat echo). We previously  discussed possible cath to look for progressive CAD/vein graft disease but have decided to defer with improving EF and no s/s angina. Also Myoview  with no ischemia or scar, No cMRI with pacemaker. -NYHA II, stable. Volume ok. Euvolemic on exam.  -Continue Lasix  40 mg daily. Takes extra 20 mg PRN  -Continue Metoprolol  XL 100 mg daily  -Continue Entresto  24-26 mg bid  -Continue Farxiga  10 mg daily  -Continue Spironolactone  25 mg daily  -Reviewed labs done at PCP office last wk. SCr and K both stable  -Update Echo   2. CAD: -S/p CABG (LIMA to LAD, SVG to PDA, SVG to OM, SVG to diagonal) in 2013 -Myoview  3/24 EF 44% normal perfusion  -She denies CP  -No aspirin  with anticoagulation -Continue statin  3. S/p mitral valve repair with MR/MS: - Echo 04/23: EF 30-35%, severe MR and possibly severe mitral stenosis. Posterior leaflet restricted.  - TEE 05/31: EF 30-35%, mild mitral valve stenosis  with mean gradient 4 mmHg (d/t small ring) and moderate MR - Echo  05/18/22 EF 40% mild to moderate MR, mild MS, mild TR  - update echo   4. Tricuspid regurgitation: - Severe on echo 04/23 - Moderate on TEE 05/23 - Mild on echo 9/23 - update echo   5. Atrial arrhythmias - AT/AFL/AF: - Off amio in 7/23 in setting of hyperthyroidism  - s/p Afib Ablation 12/24 by Dr. Cindie  - denies symptoms of breakthrough Afib. RRR on exam  - Anticoagulated with Eliquis  5 mg BID.   6. Tobacco use: -Quit smoking cold turkey 04/23  7. HLD:  -On high-intensity statin bu recent LDL above goal at 83 mg/dL -she is trying diet/lifestyle modification to get LDL lower  8. Amio-induced hyperthyroidism - amio stopped 8/23. Treated with prednisone   9. Pulmonary nodules: -Noted on CT scan 04/23. Nodularity up to 2 cm right lung apex and areas of ground-glass nodularity RUL. Findings possibly infectious or inflammatory. Neoplasm not exculded. -Recently saw Pulmonary. Nodules resolved   F/u w/ Dr. Cherrie in 8  months. If any changes in echo/drop in EF, will bring back sooner    Ashley Shed, PA-C  2:41 PM

## 2024-06-26 NOTE — Patient Instructions (Signed)
 Medication Changes:  MEDICATIONS REFILLED   ECHOCARDIOGRAM AS SCHEDULED ON 11/19   Follow-Up in: 8 MONTHS WITH DR. CHERRIE PLEASE CALL OUR OFFICE AROUND JUNE 2026 TO GET SCHEDULED FOR YOUR APPOINTMENT. PHONE NUMBER IS [phone] OPTION 2 R  At the [facility], you and your health needs are our priority. We have a designated team specialized in the treatment of Heart Failure. This Care Team includes your primary Heart Failure Specialized Cardiologist (physician), Advanced Practice Providers (APPs- Physician Assistants and Nurse Practitioners), and Pharmacist who all work together to provide you with the care you need, when you need it.   You may see any of the following providers on your designated Care Team at your next follow up:  Dr. Toribio Cherrie Dr. Ezra Shuck Dr. Odis Brownie Greig Mosses, NP Caffie Shed, [place] [facility] [place], [place] Beckey Coe, NP Jordan Lee, NP Tinnie Redman, PharmD   Please be sure to bring in all your medications bottles to every appointment.   Need to Contact Us :  If you have any questions or concerns before your next appointment please send us  a message through [place] or call our office at [phone].    TO LEAVE A MESSAGE FOR THE NURSE SELECT OPTION 2, PLEASE LEAVE A MESSAGE INCLUDING: YOUR NAME DATE OF BIRTH CALL BACK NUMBER REASON FOR CALL**this is important as we prioritize the call backs  YOU WILL RECEIVE A CALL BACK THE SAME DAY AS LONG AS YOU CALL BEFORE 4:00 PM

## 2024-07-10 ENCOUNTER — Ambulatory Visit (HOSPITAL_COMMUNITY)
Admission: RE | Admit: 2024-07-10 | Discharge: 2024-07-10 | Disposition: A | Discharge status: Home / Self Care | Source: Ambulatory Visit | Attending: Cardiology | Admitting: Cardiology

## 2024-07-10 DIAGNOSIS — I2510 Atherosclerotic heart disease of native coronary artery without angina pectoris: Secondary | ICD-10-CM | POA: Diagnosis not present

## 2024-07-10 DIAGNOSIS — I342 Nonrheumatic mitral (valve) stenosis: Secondary | ICD-10-CM | POA: Insufficient documentation

## 2024-07-10 DIAGNOSIS — Z950 Presence of cardiac pacemaker: Secondary | ICD-10-CM | POA: Diagnosis not present

## 2024-07-10 DIAGNOSIS — Z8673 Personal history of transient ischemic attack (TIA), and cerebral infarction without residual deficits: Secondary | ICD-10-CM | POA: Diagnosis not present

## 2024-07-10 DIAGNOSIS — I5042 Chronic combined systolic (congestive) and diastolic (congestive) heart failure: Secondary | ICD-10-CM | POA: Diagnosis not present

## 2024-07-10 DIAGNOSIS — E669 Obesity, unspecified: Secondary | ICD-10-CM | POA: Diagnosis not present

## 2024-07-10 DIAGNOSIS — I493 Ventricular premature depolarization: Secondary | ICD-10-CM | POA: Insufficient documentation

## 2024-07-10 DIAGNOSIS — I110 Hypertensive heart disease with heart failure: Secondary | ICD-10-CM | POA: Diagnosis not present

## 2024-07-10 DIAGNOSIS — Z951 Presence of aortocoronary bypass graft: Secondary | ICD-10-CM | POA: Diagnosis not present

## 2024-07-10 DIAGNOSIS — Z87891 Personal history of nicotine dependence: Secondary | ICD-10-CM | POA: Diagnosis not present

## 2024-07-10 DIAGNOSIS — E785 Hyperlipidemia, unspecified: Secondary | ICD-10-CM | POA: Diagnosis not present

## 2024-07-10 DIAGNOSIS — I482 Chronic atrial fibrillation, unspecified: Secondary | ICD-10-CM | POA: Diagnosis not present

## 2024-07-10 LAB — ECHOCARDIOGRAM COMPLETE
AR max vel: 1.13 cm2
AV Area VTI: 0.99 cm2
AV Area mean vel: 0.92 cm2
AV Mean grad: 5 mmHg
AV Peak grad: 9.1 mmHg
Ao pk vel: 1.51 m/s
Area-P 1/2: 1.34 cm2
MV VTI: 0.7 cm2
S' Lateral: 3.8 cm

## 2024-07-11 ENCOUNTER — Encounter: Payer: Self-pay | Admitting: Internal Medicine

## 2024-07-11 ENCOUNTER — Ambulatory Visit (HOSPITAL_COMMUNITY): Payer: Self-pay | Admitting: Cardiology

## 2024-08-29 ENCOUNTER — Telehealth: Payer: Self-pay | Admitting: Internal Medicine

## 2024-08-29 NOTE — Telephone Encounter (Signed)
" °*  STAT* If patient is at the pharmacy, call can be transferred to refill team.   1. Which medications need to be refilled? (please list name of each medication and dose if known)   sacubitril -valsartan  (ENTRESTO ) 24-26 MG   2. Would you like to learn more about the convenience, safety, & potential cost savings by using the [facility]?   3. Are you open to using the [facility] (Type [facility]. ).  4. Which pharmacy/location (including street and city if local pharmacy) is medication to be sent to?  [name] - [place], [place] - [address] AT [facility] [address]   5. Do they need a 30 day or 90 day supply?   90 day  Patient stated she is completely out of this medication.  Patient noted [facility] will need to authorize all of  her medications for 2026. "

## 2024-08-30 ENCOUNTER — Telehealth: Payer: Self-pay | Admitting: Pharmacy Technician

## 2024-08-30 ENCOUNTER — Telehealth: Payer: Self-pay | Admitting: Cardiology

## 2024-08-30 ENCOUNTER — Other Ambulatory Visit (HOSPITAL_COMMUNITY): Payer: Self-pay

## 2024-08-30 MED ORDER — SACUBITRIL-VALSARTAN 24-26 MG PO TABS: 1.0000 | ORAL_TABLET | Freq: Two times a day (BID) | ORAL | 2 refills | Status: DC

## 2024-08-30 NOTE — Telephone Encounter (Signed)
 RX sent in.

## 2024-08-30 NOTE — Telephone Encounter (Signed)
 Pharmacy Patient Advocate Encounter   Received notification from Pt Calls Messages that prior authorization for Entresto  is required/requested.   Insurance verification completed.   The patient is insured through [facility] MEDICAID.   Per test claim: The current 08/30/24 day co-pay is, $4.00- 3 months at [facility] only with DAW 1  .  No PA needed at this time. This test claim was processed through [facility]- copay amounts may vary at other pharmacies due to pharmacy/plan contracts, or as the patient moves through the different stages of their insurance plan.

## 2024-08-30 NOTE — Telephone Encounter (Signed)
 Patient stated her pharmacy [facility] [phone] - [place], [place] - [address] AT [facility] [address]) will not release her medication as her insurance company [facility] - Plan# [id number], member# [id number], phone# [phone]) is requesting a prior authorization.  Patient stated she is completely out of medication and wants a call back to confirm.

## 2024-08-30 NOTE — Telephone Encounter (Signed)
" ° °  Pt c/o medication issue:  1. Name of Medication: sacubitril -valsartan  (ENTRESTO ) 24-26 MG   2. How are you currently taking this medication (dosage and times per day)? As written  3. Are you having a reaction (difficulty breathing--STAT)? no  4. What is your medication issue?    Pt states pharm told her that her insurance needs pre auth for her medication. Please advise.  "

## 2024-09-02 MED ORDER — ENTRESTO 24-26 MG PO TABS: 1.0000 | ORAL_TABLET | Freq: Two times a day (BID) | ORAL | 3 refills | Status: AC

## 2024-09-02 NOTE — Telephone Encounter (Signed)
 Office sent brand. [facility] to fill brand

## 2024-09-17 ENCOUNTER — Ambulatory Visit: Payer: Self-pay

## 2024-09-17 DIAGNOSIS — I495 Sick sinus syndrome: Secondary | ICD-10-CM

## 2024-09-18 LAB — CUP PACEART REMOTE DEVICE CHECK
Battery Remaining Longevity: 67 mo
Battery Remaining Percentage: 53 %
Battery Voltage: 2.99 V
Brady Statistic AP VP Percent: 1 %
Brady Statistic AP VS Percent: 33 %
Brady Statistic AS VP Percent: 1 %
Brady Statistic AS VS Percent: 67 %
Brady Statistic RA Percent Paced: 32 %
Brady Statistic RV Percent Paced: 1 %
Date Time Interrogation Session: 20260127020014
Implantable Lead Connection Status: 753985
Implantable Lead Connection Status: 753985
Implantable Lead Implant Date: 20201103
Implantable Lead Implant Date: 20201103
Implantable Lead Location: 753859
Implantable Lead Location: 753860
Implantable Pulse Generator Implant Date: 20201103
Lead Channel Impedance Value: 410 Ohm
Lead Channel Impedance Value: 560 Ohm
Lead Channel Pacing Threshold Amplitude: 0.75 V
Lead Channel Pacing Threshold Amplitude: 1.5 V
Lead Channel Pacing Threshold Pulse Width: 0.5 ms
Lead Channel Pacing Threshold Pulse Width: 0.5 ms
Lead Channel Sensing Intrinsic Amplitude: 12 mV
Lead Channel Sensing Intrinsic Amplitude: 5 mV
Lead Channel Setting Pacing Amplitude: 1 V
Lead Channel Setting Pacing Amplitude: 2.5 V
Lead Channel Setting Pacing Pulse Width: 0.5 ms
Lead Channel Setting Sensing Sensitivity: 2 mV
Pulse Gen Model: 2272
Pulse Gen Serial Number: 9174760

## 2024-09-19 ENCOUNTER — Ambulatory Visit: Payer: Self-pay | Admitting: Cardiology

## 2024-09-26 NOTE — Progress Notes (Signed)
 Remote PPM Transmission

## 2024-12-17 ENCOUNTER — Ambulatory Visit: Payer: Self-pay
